# Patient Record
Sex: Male | Born: 1940 | Race: White | Hispanic: No | Marital: Married | Smoking: Former smoker
Health system: Southern US, Community
[De-identification: ages and names within clinical notes are randomized; demographics above are authoritative.]

## PROBLEM LIST (undated history)

## (undated) DIAGNOSIS — Z973 Presence of spectacles and contact lenses: Secondary | ICD-10-CM

## (undated) DIAGNOSIS — T8859XA Other complications of anesthesia, initial encounter: Secondary | ICD-10-CM

## (undated) DIAGNOSIS — E785 Hyperlipidemia, unspecified: Secondary | ICD-10-CM

## (undated) DIAGNOSIS — K449 Diaphragmatic hernia without obstruction or gangrene: Secondary | ICD-10-CM

## (undated) DIAGNOSIS — K5790 Diverticulosis of intestine, part unspecified, without perforation or abscess without bleeding: Secondary | ICD-10-CM

## (undated) DIAGNOSIS — Z974 Presence of external hearing-aid: Secondary | ICD-10-CM

## (undated) DIAGNOSIS — F319 Bipolar disorder, unspecified: Secondary | ICD-10-CM

## (undated) DIAGNOSIS — M549 Dorsalgia, unspecified: Secondary | ICD-10-CM

## (undated) DIAGNOSIS — Z972 Presence of dental prosthetic device (complete) (partial): Secondary | ICD-10-CM

## (undated) DIAGNOSIS — K8020 Calculus of gallbladder without cholecystitis without obstruction: Secondary | ICD-10-CM

## (undated) DIAGNOSIS — E119 Type 2 diabetes mellitus without complications: Secondary | ICD-10-CM

## (undated) DIAGNOSIS — N529 Male erectile dysfunction, unspecified: Secondary | ICD-10-CM

## (undated) DIAGNOSIS — K219 Gastro-esophageal reflux disease without esophagitis: Secondary | ICD-10-CM

## (undated) DIAGNOSIS — I10 Essential (primary) hypertension: Secondary | ICD-10-CM

## (undated) DIAGNOSIS — J449 Chronic obstructive pulmonary disease, unspecified: Secondary | ICD-10-CM

## (undated) DIAGNOSIS — I499 Cardiac arrhythmia, unspecified: Secondary | ICD-10-CM

## (undated) DIAGNOSIS — G8929 Other chronic pain: Secondary | ICD-10-CM

## (undated) HISTORY — DX: Dorsalgia, unspecified: M54.9

## (undated) HISTORY — DX: Other chronic pain: G89.29

## (undated) HISTORY — DX: Chronic obstructive pulmonary disease, unspecified: J44.9

## (undated) HISTORY — PX: COLONOSCOPY: SHX174

## (undated) HISTORY — DX: Bipolar disorder, unspecified: F31.9

## (undated) HISTORY — DX: Essential (primary) hypertension: I10

## (undated) HISTORY — DX: Diaphragmatic hernia without obstruction or gangrene: K44.9

## (undated) HISTORY — DX: Male erectile dysfunction, unspecified: N52.9

## (undated) HISTORY — PX: KNEE ARTHROSCOPY: SUR90

## (undated) HISTORY — PX: TONSILLECTOMY: SUR1361

## (undated) HISTORY — DX: Diverticulosis of intestine, part unspecified, without perforation or abscess without bleeding: K57.90

## (undated) HISTORY — DX: Hyperlipidemia, unspecified: E78.5

## (undated) HISTORY — PX: CATARACT EXTRACTION, BILATERAL: SHX1313

---

## 1999-11-06 ENCOUNTER — Encounter (INDEPENDENT_AMBULATORY_CARE_PROVIDER_SITE_OTHER): Payer: Self-pay | Admitting: Specialist

## 1999-11-06 ENCOUNTER — Ambulatory Visit (HOSPITAL_COMMUNITY): Admission: RE | Admit: 1999-11-06 | Discharge: 1999-11-06 | Payer: Self-pay | Admitting: Gastroenterology

## 2001-06-28 ENCOUNTER — Encounter: Payer: Self-pay | Admitting: Orthopedic Surgery

## 2001-06-28 ENCOUNTER — Ambulatory Visit (HOSPITAL_COMMUNITY): Admission: RE | Admit: 2001-06-28 | Discharge: 2001-06-28 | Payer: Self-pay | Admitting: Orthopedic Surgery

## 2003-03-15 ENCOUNTER — Encounter: Payer: Self-pay | Admitting: General Surgery

## 2003-03-22 ENCOUNTER — Ambulatory Visit (HOSPITAL_COMMUNITY): Admission: RE | Admit: 2003-03-22 | Discharge: 2003-03-22 | Payer: Self-pay | Admitting: General Surgery

## 2008-09-03 ENCOUNTER — Emergency Department (HOSPITAL_COMMUNITY): Admission: EM | Admit: 2008-09-03 | Discharge: 2008-09-03 | Payer: Self-pay | Admitting: Emergency Medicine

## 2010-07-17 ENCOUNTER — Ambulatory Visit (HOSPITAL_COMMUNITY): Admission: RE | Admit: 2010-07-17 | Discharge: 2010-07-17 | Payer: Self-pay | Admitting: Gastroenterology

## 2011-05-15 NOTE — Op Note (Signed)
NAME:  Douglas Hurst, Douglas Hurst NO.:  [id number]   MEDICAL RECORD NO.:  [id number]                   PATIENT TYPE:  AMB   LOCATION:  DAY                                  FACILITY:  [facility]   PHYSICIAN:  Adolph Pollack, M.D.            DATE OF BIRTH:  Jul 21, 1941   DATE OF PROCEDURE:  03/22/2003  DATE OF DISCHARGE:                                 OPERATIVE REPORT   PREOPERATIVE DIAGNOSIS:  Bilateral inguinal hernias.   POSTOPERATIVE DIAGNOSIS:  Bilateral direct inguinal hernias.   PROCEDURE:  Laparoscopic repair of bilateral inguinal hernias with mesh.   SURGEON:  Adolph Pollack, M.D.   ANESTHESIA:  General.   INDICATION:  Douglas Hurst is a 70 year old male with some pain in the right  groin.  He is sent to me for evaluation of right inguinal hernia, and I  noted him also to have a left inguinal hernia.  I suggested a laparoscopic  bilateral inguinal repairs with mesh.  We went over the procedure and risks  in the office.  He is here now to proceed with repair.   TECHNIQUE:  He was seen in the holding room.  He was then taken to the  operating room, placed supine on the operating table, and a general  anesthetic was administered.  The lower abdomen was shaved, and then the  lower abdomen and groin were sterilely prepped and draped.  Marcaine 0.5%  was infiltrated in the subumbilical region, and a transverse subumbilical  incision was made through the skin and subcutaneous tissue.  Using blunt  dissection, I identified the right anterior rectus sheath and made a small  incision in the anterior rectus sheath on the right side.  The underlying  right rectus muscle was then swept laterally, exposing the posterior rectus  sheath.  A balloon dissection device was then placed into the  extraperitoneal space under laparoscopic vision.  Balloon dissection was  performed.  Once this was done, the balloon dissection device was removed  and the trocars placed into  the extraperitoneal space, and CO2 gas was  insufflated, creating a working area.  Laparoscope was introduced at that  time.  The Cooper's ligament and the direct defect were evident on the right  side.  Under direct vision, I placed two 5 mm trocars in the lower midline  through similar sized incisions.  Using blunt dissection, I exposed the  anterior and lateral abdominal walls up to the level of the umbilicus.  I  then identified the spermatic cord, isolated it, and removed a lipoma of the  cord.  Peritoneum was swept superiorly.  No indirect hernia was noted.  I  had adequate exposure of the direct, indirect, and femoral spaces at this  time.   Next, I approached the left side and exposed Cooper's ligament fully.  I  then, using blunt dissection, exposed the anterior and lateral abdominal  walls  up to the level of the umbilicus.  The spermatic cord was identified  and isolated.  A small indirect defect was noted on the left side.  The  peritoneum on the left side was swept superiorly up to the level of the  umbilicus.   Next, a piece of approximately 4 x 6 inch mesh was brought into the field,  and a longitudinal slit cut into it, and it was placed in the right  extraperitoneal space.  Once adequate placement was verified, it was  anchored to the Cooper's ligament in the anterior and lateral abdominal  walls with spiral tacks.  The two tails of the mesh were wrapped around the  cord, creating a new internal ring.  The mesh adequately covered the direct,  indirect, and femoral spaces.   In a similar fashion, I had an approximately 4 x 6 inch piece of mesh and  placed it in the left extraperitoneal space and positioned with the two  tails wrapped around the cord.  The mesh was then anchored to Cooper's  ligament and the anterior abdominal wall and lateral abdominal wall with  spiral tacks.  It adequately covered the direct, indirect, and femoral  spaces.  The anterolateral aspect of  both of the meshes were then held, and  the CO2 gas was released and extraperitoneal contents were allowed to  approximate to the mesh.  Subsequently, all trocars were removed.  The right  rectus sheath fascial defect was closed with interrupted 0 Vicryl sutures.  The skin incisions were closed with 4-0 Monocryl subcuticular stitches.  Steri-Strips and sterile dressings were applied.   He tolerated the procedure well without any apparent complications and was  taken to the recovery room in satisfactory condition.  The plan will be to  discharge him to home with instructions and Tylox for pain and follow up  with me in 2-3 weeks.                                               Adolph Pollack, M.D.    Kari Baars  D:  03/22/2003  T:  03/22/2003  Job:  [id number]

## 2011-09-30 LAB — BASIC METABOLIC PANEL
BUN: 22
CO2: 29
Calcium: 9.1
Chloride: 100
Creatinine, Ser: 0.9
GFR calc Af Amer: >60
GFR calc non Af Amer: >60
Glucose, Bld: 77
Potassium: 3.9
Sodium: 137

## 2011-09-30 LAB — URINALYSIS, ROUTINE W REFLEX MICROSCOPIC
Bilirubin Urine: NEGATIVE
Ketones, ur: NEGATIVE
Nitrite: NEGATIVE
Protein, ur: NEGATIVE
Urobilinogen, UA: 1
pH: 6

## 2011-09-30 LAB — POCT CARDIAC MARKERS
CKMB, poc: 7.1
Myoglobin, poc: 74.7
Troponin i, poc: <0.05

## 2011-09-30 LAB — DIFFERENTIAL
Basophils Absolute: 0
Eosinophils Relative: 4
Lymphocytes Relative: 30
Lymphs Abs: 2.8
Neutro Abs: 5.1

## 2011-09-30 LAB — B-NATRIURETIC PEPTIDE (CONVERTED LAB): Pro B Natriuretic peptide (BNP): <30

## 2011-09-30 LAB — CBC
HCT: 53.3 — ABNORMAL HIGH
Platelets: 193
RDW: 14.2
WBC: 9.4

## 2011-09-30 LAB — D-DIMER, QUANTITATIVE: D-Dimer, Quant: 0.39

## 2012-05-16 DIAGNOSIS — J449 Chronic obstructive pulmonary disease, unspecified: Secondary | ICD-10-CM | POA: Diagnosis not present

## 2012-05-16 DIAGNOSIS — I10 Essential (primary) hypertension: Secondary | ICD-10-CM | POA: Diagnosis not present

## 2012-05-16 DIAGNOSIS — K219 Gastro-esophageal reflux disease without esophagitis: Secondary | ICD-10-CM | POA: Diagnosis not present

## 2012-05-16 DIAGNOSIS — R7301 Impaired fasting glucose: Secondary | ICD-10-CM | POA: Diagnosis not present

## 2012-05-16 DIAGNOSIS — E785 Hyperlipidemia, unspecified: Secondary | ICD-10-CM | POA: Diagnosis not present

## 2012-06-14 DIAGNOSIS — I714 Abdominal aortic aneurysm, without rupture: Secondary | ICD-10-CM | POA: Diagnosis not present

## 2012-06-14 DIAGNOSIS — Z125 Encounter for screening for malignant neoplasm of prostate: Secondary | ICD-10-CM | POA: Diagnosis not present

## 2012-06-14 DIAGNOSIS — Z1331 Encounter for screening for depression: Secondary | ICD-10-CM | POA: Diagnosis not present

## 2012-06-14 DIAGNOSIS — Z0000 Encounter for general adult medical examination without abnormal findings: Secondary | ICD-10-CM | POA: Diagnosis not present

## 2012-06-29 DIAGNOSIS — H04129 Dry eye syndrome of unspecified lacrimal gland: Secondary | ICD-10-CM | POA: Diagnosis not present

## 2012-06-29 DIAGNOSIS — Z961 Presence of intraocular lens: Secondary | ICD-10-CM | POA: Diagnosis not present

## 2012-06-29 DIAGNOSIS — H35379 Puckering of macula, unspecified eye: Secondary | ICD-10-CM | POA: Diagnosis not present

## 2012-07-01 DIAGNOSIS — I714 Abdominal aortic aneurysm, without rupture: Secondary | ICD-10-CM | POA: Diagnosis not present

## 2012-10-06 DIAGNOSIS — Z23 Encounter for immunization: Secondary | ICD-10-CM | POA: Diagnosis not present

## 2012-12-15 DIAGNOSIS — J449 Chronic obstructive pulmonary disease, unspecified: Secondary | ICD-10-CM | POA: Diagnosis not present

## 2012-12-15 DIAGNOSIS — I10 Essential (primary) hypertension: Secondary | ICD-10-CM | POA: Diagnosis not present

## 2013-06-01 DIAGNOSIS — H0230 Blepharochalasis unspecified eye, unspecified eyelid: Secondary | ICD-10-CM | POA: Diagnosis not present

## 2013-06-01 DIAGNOSIS — H35379 Puckering of macula, unspecified eye: Secondary | ICD-10-CM | POA: Diagnosis not present

## 2013-06-01 DIAGNOSIS — Z961 Presence of intraocular lens: Secondary | ICD-10-CM | POA: Diagnosis not present

## 2013-06-01 DIAGNOSIS — H02409 Unspecified ptosis of unspecified eyelid: Secondary | ICD-10-CM | POA: Diagnosis not present

## 2013-06-15 DIAGNOSIS — R7301 Impaired fasting glucose: Secondary | ICD-10-CM | POA: Diagnosis not present

## 2013-06-15 DIAGNOSIS — J449 Chronic obstructive pulmonary disease, unspecified: Secondary | ICD-10-CM | POA: Diagnosis not present

## 2013-06-15 DIAGNOSIS — R7989 Other specified abnormal findings of blood chemistry: Secondary | ICD-10-CM | POA: Diagnosis not present

## 2013-06-15 DIAGNOSIS — I10 Essential (primary) hypertension: Secondary | ICD-10-CM | POA: Diagnosis not present

## 2013-06-15 DIAGNOSIS — E785 Hyperlipidemia, unspecified: Secondary | ICD-10-CM | POA: Diagnosis not present

## 2013-06-15 DIAGNOSIS — I714 Abdominal aortic aneurysm, without rupture: Secondary | ICD-10-CM | POA: Diagnosis not present

## 2013-07-21 DIAGNOSIS — I714 Abdominal aortic aneurysm, without rupture: Secondary | ICD-10-CM | POA: Diagnosis not present

## 2013-12-15 DIAGNOSIS — I10 Essential (primary) hypertension: Secondary | ICD-10-CM | POA: Diagnosis not present

## 2013-12-15 DIAGNOSIS — Z23 Encounter for immunization: Secondary | ICD-10-CM | POA: Diagnosis not present

## 2013-12-15 DIAGNOSIS — Z1331 Encounter for screening for depression: Secondary | ICD-10-CM | POA: Diagnosis not present

## 2013-12-15 DIAGNOSIS — J449 Chronic obstructive pulmonary disease, unspecified: Secondary | ICD-10-CM | POA: Diagnosis not present

## 2013-12-15 DIAGNOSIS — R7301 Impaired fasting glucose: Secondary | ICD-10-CM | POA: Diagnosis not present

## 2013-12-15 DIAGNOSIS — E785 Hyperlipidemia, unspecified: Secondary | ICD-10-CM | POA: Diagnosis not present

## 2013-12-15 DIAGNOSIS — N529 Male erectile dysfunction, unspecified: Secondary | ICD-10-CM | POA: Diagnosis not present

## 2014-06-18 ENCOUNTER — Other Ambulatory Visit: Payer: Self-pay | Admitting: Internal Medicine

## 2014-06-18 DIAGNOSIS — I10 Essential (primary) hypertension: Secondary | ICD-10-CM | POA: Diagnosis not present

## 2014-06-18 DIAGNOSIS — R7301 Impaired fasting glucose: Secondary | ICD-10-CM | POA: Diagnosis not present

## 2014-06-18 DIAGNOSIS — N529 Male erectile dysfunction, unspecified: Secondary | ICD-10-CM | POA: Diagnosis not present

## 2014-06-18 DIAGNOSIS — I714 Abdominal aortic aneurysm, without rupture, unspecified: Secondary | ICD-10-CM | POA: Diagnosis not present

## 2014-06-18 DIAGNOSIS — Z23 Encounter for immunization: Secondary | ICD-10-CM | POA: Diagnosis not present

## 2014-06-18 DIAGNOSIS — E785 Hyperlipidemia, unspecified: Secondary | ICD-10-CM | POA: Diagnosis not present

## 2014-06-18 DIAGNOSIS — Z0000 Encounter for general adult medical examination without abnormal findings: Secondary | ICD-10-CM | POA: Diagnosis not present

## 2014-06-18 DIAGNOSIS — Z1331 Encounter for screening for depression: Secondary | ICD-10-CM | POA: Diagnosis not present

## 2014-06-18 DIAGNOSIS — J449 Chronic obstructive pulmonary disease, unspecified: Secondary | ICD-10-CM | POA: Diagnosis not present

## 2014-06-18 DIAGNOSIS — K219 Gastro-esophageal reflux disease without esophagitis: Secondary | ICD-10-CM | POA: Diagnosis not present

## 2014-06-21 DIAGNOSIS — J449 Chronic obstructive pulmonary disease, unspecified: Secondary | ICD-10-CM | POA: Diagnosis not present

## 2014-06-26 ENCOUNTER — Ambulatory Visit
Admission: RE | Admit: 2014-06-26 | Discharge: 2014-06-26 | Disposition: A | Discharge status: Home / Self Care | Payer: Medicare Other | Source: Ambulatory Visit | Attending: Internal Medicine | Admitting: Internal Medicine

## 2014-06-26 DIAGNOSIS — I714 Abdominal aortic aneurysm, without rupture, unspecified: Secondary | ICD-10-CM

## 2014-11-26 DIAGNOSIS — H35373 Puckering of macula, bilateral: Secondary | ICD-10-CM | POA: Diagnosis not present

## 2014-11-26 DIAGNOSIS — Z961 Presence of intraocular lens: Secondary | ICD-10-CM | POA: Diagnosis not present

## 2014-11-28 DIAGNOSIS — J329 Chronic sinusitis, unspecified: Secondary | ICD-10-CM | POA: Diagnosis not present

## 2014-12-02 ENCOUNTER — Encounter: Payer: Self-pay | Admitting: *Deleted

## 2014-12-12 DIAGNOSIS — Z23 Encounter for immunization: Secondary | ICD-10-CM | POA: Diagnosis not present

## 2014-12-24 DIAGNOSIS — N5201 Erectile dysfunction due to arterial insufficiency: Secondary | ICD-10-CM | POA: Diagnosis not present

## 2014-12-24 DIAGNOSIS — E119 Type 2 diabetes mellitus without complications: Secondary | ICD-10-CM | POA: Diagnosis not present

## 2014-12-24 DIAGNOSIS — I10 Essential (primary) hypertension: Secondary | ICD-10-CM | POA: Diagnosis not present

## 2014-12-24 DIAGNOSIS — J449 Chronic obstructive pulmonary disease, unspecified: Secondary | ICD-10-CM | POA: Diagnosis not present

## 2014-12-24 DIAGNOSIS — K219 Gastro-esophageal reflux disease without esophagitis: Secondary | ICD-10-CM | POA: Diagnosis not present

## 2015-06-25 DIAGNOSIS — Z1389 Encounter for screening for other disorder: Secondary | ICD-10-CM | POA: Diagnosis not present

## 2015-06-25 DIAGNOSIS — R1314 Dysphagia, pharyngoesophageal phase: Secondary | ICD-10-CM | POA: Diagnosis not present

## 2015-06-25 DIAGNOSIS — I10 Essential (primary) hypertension: Secondary | ICD-10-CM | POA: Diagnosis not present

## 2015-06-25 DIAGNOSIS — E782 Mixed hyperlipidemia: Secondary | ICD-10-CM | POA: Diagnosis not present

## 2015-06-25 DIAGNOSIS — E119 Type 2 diabetes mellitus without complications: Secondary | ICD-10-CM | POA: Diagnosis not present

## 2015-06-25 DIAGNOSIS — I714 Abdominal aortic aneurysm, without rupture: Secondary | ICD-10-CM | POA: Diagnosis not present

## 2015-06-25 DIAGNOSIS — F319 Bipolar disorder, unspecified: Secondary | ICD-10-CM | POA: Diagnosis not present

## 2015-06-25 DIAGNOSIS — J449 Chronic obstructive pulmonary disease, unspecified: Secondary | ICD-10-CM | POA: Diagnosis not present

## 2015-09-18 ENCOUNTER — Other Ambulatory Visit: Payer: Self-pay | Admitting: Gastroenterology

## 2015-09-18 DIAGNOSIS — R1314 Dysphagia, pharyngoesophageal phase: Secondary | ICD-10-CM | POA: Diagnosis not present

## 2015-09-18 DIAGNOSIS — R1310 Dysphagia, unspecified: Secondary | ICD-10-CM

## 2015-09-18 DIAGNOSIS — Z800 Family history of malignant neoplasm of digestive organs: Secondary | ICD-10-CM | POA: Diagnosis not present

## 2015-09-24 ENCOUNTER — Ambulatory Visit
Admission: RE | Admit: 2015-09-24 | Discharge: 2015-09-24 | Disposition: A | Discharge status: Home / Self Care | Payer: Medicare Other | Source: Ambulatory Visit | Attending: Gastroenterology | Admitting: Gastroenterology

## 2015-09-24 ENCOUNTER — Other Ambulatory Visit: Payer: Self-pay | Admitting: Gastroenterology

## 2015-09-24 DIAGNOSIS — R1310 Dysphagia, unspecified: Secondary | ICD-10-CM | POA: Diagnosis not present

## 2015-09-26 ENCOUNTER — Other Ambulatory Visit: Payer: Self-pay | Admitting: Gastroenterology

## 2015-09-26 ENCOUNTER — Encounter (HOSPITAL_COMMUNITY): Payer: Self-pay | Admitting: *Deleted

## 2015-10-01 ENCOUNTER — Ambulatory Visit (HOSPITAL_COMMUNITY)
Admission: RE | Admit: 2015-10-01 | Discharge: 2015-10-01 | Disposition: A | Discharge status: Home / Self Care | Payer: Medicare Other | Source: Ambulatory Visit | Attending: Gastroenterology | Admitting: Gastroenterology

## 2015-10-01 ENCOUNTER — Ambulatory Visit (HOSPITAL_COMMUNITY): Payer: Medicare Other | Admitting: Certified Registered Nurse Anesthetist

## 2015-10-01 ENCOUNTER — Encounter (HOSPITAL_COMMUNITY)
Admission: RE | Disposition: A | Discharge status: Home / Self Care | Payer: Self-pay | Source: Ambulatory Visit | Attending: Gastroenterology

## 2015-10-01 ENCOUNTER — Encounter (HOSPITAL_COMMUNITY): Payer: Self-pay | Admitting: *Deleted

## 2015-10-01 DIAGNOSIS — Z9842 Cataract extraction status, left eye: Secondary | ICD-10-CM | POA: Insufficient documentation

## 2015-10-01 DIAGNOSIS — E7800 Pure hypercholesterolemia, unspecified: Secondary | ICD-10-CM | POA: Insufficient documentation

## 2015-10-01 DIAGNOSIS — Z800 Family history of malignant neoplasm of digestive organs: Secondary | ICD-10-CM | POA: Insufficient documentation

## 2015-10-01 DIAGNOSIS — E119 Type 2 diabetes mellitus without complications: Secondary | ICD-10-CM | POA: Diagnosis not present

## 2015-10-01 DIAGNOSIS — K449 Diaphragmatic hernia without obstruction or gangrene: Secondary | ICD-10-CM | POA: Diagnosis not present

## 2015-10-01 DIAGNOSIS — Z87891 Personal history of nicotine dependence: Secondary | ICD-10-CM | POA: Diagnosis not present

## 2015-10-01 DIAGNOSIS — K219 Gastro-esophageal reflux disease without esophagitis: Secondary | ICD-10-CM | POA: Insufficient documentation

## 2015-10-01 DIAGNOSIS — Z9841 Cataract extraction status, right eye: Secondary | ICD-10-CM | POA: Diagnosis not present

## 2015-10-01 DIAGNOSIS — K222 Esophageal obstruction: Secondary | ICD-10-CM | POA: Insufficient documentation

## 2015-10-01 DIAGNOSIS — K5710 Diverticulosis of small intestine without perforation or abscess without bleeding: Secondary | ICD-10-CM | POA: Insufficient documentation

## 2015-10-01 DIAGNOSIS — I10 Essential (primary) hypertension: Secondary | ICD-10-CM | POA: Insufficient documentation

## 2015-10-01 DIAGNOSIS — J449 Chronic obstructive pulmonary disease, unspecified: Secondary | ICD-10-CM | POA: Insufficient documentation

## 2015-10-01 DIAGNOSIS — R1314 Dysphagia, pharyngoesophageal phase: Secondary | ICD-10-CM | POA: Diagnosis not present

## 2015-10-01 DIAGNOSIS — Z86010 Personal history of colonic polyps: Secondary | ICD-10-CM | POA: Diagnosis not present

## 2015-10-01 DIAGNOSIS — R1310 Dysphagia, unspecified: Secondary | ICD-10-CM | POA: Diagnosis present

## 2015-10-01 HISTORY — PX: ESOPHAGOGASTRODUODENOSCOPY (EGD) WITH PROPOFOL: SHX5813

## 2015-10-01 HISTORY — PX: BALLOON DILATION: SHX5330

## 2015-10-01 SURGERY — ESOPHAGOGASTRODUODENOSCOPY (EGD) WITH PROPOFOL
Anesthesia: Monitor Anesthesia Care

## 2015-10-01 MED ORDER — PROPOFOL 10 MG/ML IV BOLUS
INTRAVENOUS | Status: AC
  Filled 2015-10-01: qty 20

## 2015-10-01 MED ORDER — EPHEDRINE SULFATE 50 MG/ML IJ SOLN
INTRAMUSCULAR | Status: AC
  Filled 2015-10-01: qty 1

## 2015-10-01 MED ORDER — SODIUM CHLORIDE 0.9 % IV SOLN: INTRAVENOUS | Status: DC

## 2015-10-01 MED ORDER — ONDANSETRON HCL 4 MG/2ML IJ SOLN
INTRAMUSCULAR | Status: AC
Start: 2015-10-01 — End: 2015-10-01
  Filled 2015-10-01: qty 2

## 2015-10-01 MED ORDER — LIDOCAINE HCL (CARDIAC) 20 MG/ML IV SOLN
INTRAVENOUS | Status: AC
  Filled 2015-10-01: qty 5

## 2015-10-01 MED ORDER — PROPOFOL 500 MG/50ML IV EMUL
INTRAVENOUS | Status: DC | PRN
  Administered 2015-10-01: 100 ug/kg/min via INTRAVENOUS

## 2015-10-01 MED ORDER — ATROPINE SULFATE 0.4 MG/ML IJ SOLN
INTRAMUSCULAR | Status: AC
  Filled 2015-10-01: qty 1

## 2015-10-01 MED ORDER — LACTATED RINGERS IV SOLN
INTRAVENOUS | Status: DC
  Administered 2015-10-01: 1000 mL via INTRAVENOUS

## 2015-10-01 MED ORDER — PROPOFOL 10 MG/ML IV BOLUS
INTRAVENOUS | Status: DC | PRN
  Administered 2015-10-01 (×3): 20 mg via INTRAVENOUS

## 2015-10-01 MED ORDER — ONDANSETRON HCL 4 MG/2ML IJ SOLN
INTRAMUSCULAR | Status: DC | PRN
  Administered 2015-10-01: 4 mg via INTRAVENOUS

## 2015-10-01 MED ORDER — SODIUM CHLORIDE 0.9 % IJ SOLN
INTRAMUSCULAR | Status: AC
  Filled 2015-10-01: qty 10

## 2015-10-01 MED ORDER — LIDOCAINE HCL (CARDIAC) 20 MG/ML IV SOLN
INTRAVENOUS | Status: DC | PRN
  Administered 2015-10-01: 100 mg via INTRAVENOUS

## 2015-10-01 SURGICAL SUPPLY — 14 items

## 2015-10-01 NOTE — Discharge Instructions (Signed)
Esophagogastroduodenoscopy °Care After °Refer to this sheet in the next few weeks. These instructions provide you with information on caring for yourself after your procedure. Your caregiver may also give you more specific instructions. Your treatment has been planned according to current medical practices, but problems sometimes occur. Call your caregiver if you have any problems or questions after your procedure.  °HOME CARE INSTRUCTIONS °· Do not eat or drink anything until the numbing medicine (local anesthetic) has worn off and your gag reflex has returned. You will know that the local anesthetic has worn off when you can swallow comfortably. °· Do not drive for 12 hours after the procedure or as directed by your caregiver. °· Only take medicines as directed by your caregiver. °SEEK MEDICAL CARE IF:  °· You cannot stop coughing. °· You are not urinating at all or less than usual. °SEEK IMMEDIATE MEDICAL CARE IF: °· You have difficulty swallowing. °· You cannot eat or drink. °· You have worsening throat or chest pain. °· You have dizziness, lightheadedness, or you faint. °· You have nausea or vomiting. °· You have chills. °· You have a fever. °· You have severe abdominal pain. °· You have black, tarry, or bloody stools. °Document Released: 11/30/2012 Document Reviewed: 11/30/2012 °ExitCare® Patient Information ©2015 [employer]. This information is not intended to replace advice given to you by your health care provider. Make sure you discuss any questions you have with your health care provider. °Esophageal Dilatation °The esophagus is the long, narrow tube which carries food and liquid from the mouth to the stomach. Esophageal dilatation is the technique used to stretch a blocked or narrowed portion of the esophagus. This procedure is used when a part of the esophagus has become so narrow that it becomes difficult, painful or even impossible to swallow. This is generally an uncomplicated form of treatment.  When this is not successful, chest surgery may be required. This is a much more extensive form of treatment with a longer recovery time. °CAUSES  °Some of the more common causes of blockage or strictures of the esophagus are: °· Narrowing from longstanding inflammation (soreness and redness) of the lower esophagus. This comes from the constant exposure of the lower esophagus to the acid which bubbles up from the stomach. Over time this causes scarring and narrowing of the lower esophagus. °· Hiatal hernia in which a small part of the stomach bulges (herniates) up through the diaphragm. This can cause a gradual narrowing of the end of the esophagus. °· Schatzki ring is a narrow ring of benign (non-cancerous) fibrous tissue which constricts the lower esophagus. The reason for this is not known. °· Scleroderma is a connective tissue disorder that affects the esophagus and makes swallowing difficult. °· Achalasia is an absence of nerves to the lower esophagus and to the esophageal sphincter. This is the circular muscle between the stomach and esophagus that relaxes to allow food into the stomach. After swallowing, it contracts to keep food in the stomach. This absence of nerves may be congenital (present since birth). This can cause irregular spasms of the lower esophageal muscle. This spasm does not open up to allow food and fluid through. The result is a persistent blockage with subsequent slow trickling of the esophageal contents into the stomach. °· Strictures may develop from swallowing materials which damage the esophagus. Some examples are strong acids or alkalis such as lye. °· Growths such as benign (non-cancerous) and malignant (cancerous) tumors can block the esophagus. °· Hereditary (present   since birth) causes. °DIAGNOSIS  °Your caregiver often suspects this problem by taking a medical history. They will also do a physical exam. They can then prove their suspicions using X-rays and endoscopy. Endoscopy is  an exam in which a tube like a small, flexible telescope is used to look at your esophagus.  °TREATMENT °There are different stretching (dilating) techniques that can be used. Simple bougie dilatation may be done in the office. This usually takes only a couple minutes. A numbing (anesthetic) spray of the throat is used. Endoscopy, when done, is done in an endoscopy suite under mild sedation. When fluoroscopy is used, the procedure is performed in X-ray. Other techniques require a little longer time. Recovery is usually quick. There is no waiting time to begin eating and drinking to test success of the treatment. Following are some of the methods used. °Narrowing of the esophagus is treated by making it bigger. °Commonly this is a mechanical problem which can be treated with stretching. This can be done in different ways. Your caregiver will discuss these with you. Some of the means used are: °· A series of graduated (increasing thickness) flexible dilators can be used. These are weighted tubes passed through the esophagus into the stomach. The tubes used become progressively larger until the desired stretched size is reached. Graduated dilators are a simple and quick way of opening the esophagus. No visualization is required. °· Another method is the use of endoscopy to place a flexible wire across the stricture. The endoscope is removed and the wire left in place. A dilator with a hole through it from end to end is guided down the esophagus and across the stricture. One or more of these dilators are passed over the wire. At the end of the exam, the wire is removed. This type of treatment may be performed in the X-ray department under fluoroscopy. An advantage of this procedure is the examiner is visualizing the end opening in the esophagus. °· Stretching of the esophagus may be done using balloons. Deflated balloons are placed through the endoscope and across the stricture. This type of balloon dilatation is often  done at the time of endoscopy or fluoroscopy. Flexible endoscopy allows the examiner to directly view the stricture. A balloon is inserted in the deflated form into the area of narrowing. It is then inflated with air to a certain pressure that is preset for a given circumference. When inflated, it becomes sausage shaped, stretched, and makes the stricture larger. °· Achalasia requires a longer, larger balloon-type dilator. This is frequently done under X-ray control. In this situation, the spastic muscle fibers in the lower esophagus are stretched. °All of the above procedures make the passage of food and water into the stomach easier. They also make it easier for stomach contents to reflux back into the esophagus. Special medications may be used following the procedure to help prevent further stricturing. Proton-pump inhibitor medications are good at decreasing the amount of acid in the stomach juice. When stomach juice refluxes into the esophagus, the juice is no longer as acidic and is less likely to burn or scar the esophagus. °RISKS AND COMPLICATIONS °Esophageal dilatation is usually performed effectively and without problems. Some complications that can occur are: °· A small amount of bleeding almost always happens where the stretching takes place. If this is too excessive it may require more aggressive treatment. °· An uncommon complication is perforation (making a hole) of the esophagus. The esophagus is thin. It is easy to make   a hole in it. If this happens, an operation may be necessary to repair this. °· A small, undetected perforation could lead to an infection in the chest. This can be very serious. °HOME CARE INSTRUCTIONS  °· If you received sedation for your procedure, do not drive, make important decisions, or perform any activities requiring your full coordination. Do not drink alcohol, take sedatives, or use any mind altering chemicals unless instructed by your caregiver. °· You may use throat  lozenges or warm salt water gargles if you have throat discomfort. °· You can begin eating and drinking normally on return home unless instructed otherwise. Do not purposely try to force large chunks of food down to test the benefits of your procedure. °· Mild discomfort can be eased with sips of ice water. °· Medications for discomfort may or may not be needed. °SEEK IMMEDIATE MEDICAL CARE IF:  °· You begin vomiting up blood. °· You develop black, tarry stools. °· You develop chills or an unexplained temperature of over 101°F (38.3°C) °· You develop chest or abdominal pain. °· You develop shortness of breath, or feel light-headed or faint. °· Your swallowing is becoming more painful, difficult, or you are unable to swallow. °MAKE SURE YOU:  °· Understand these instructions. °· Will watch your condition. °· Will get help right away if you are not doing well or get worse. °Document Released: 02/04/2006 Document Revised: 04/30/2014 Document Reviewed: 03/24/2006 °ExitCare® Patient Information ©2015 [employer]. This information is not intended to replace advice given to you by your health care provider. Make sure you discuss any questions you have with your health care provider. ° °

## 2015-10-01 NOTE — Anesthesia Postprocedure Evaluation (Signed)
  Anesthesia Post-op Note  Patient: Douglas Hurst.  Procedure(s) Performed: Procedure(s) (LRB): ESOPHAGOGASTRODUODENOSCOPY (EGD) WITH PROPOFOL (N/A) BALLOON DILATION (N/A)  Patient Location: PACU  Anesthesia Type: MAC  Level of Consciousness: awake and alert   Airway and Oxygen Therapy: Patient Spontanous Breathing  Post-op Pain: mild  Post-op Assessment: Post-op Vital signs reviewed, Patient's Cardiovascular Status Stable, Respiratory Function Stable, Patent Airway and No signs of Nausea or vomiting  Last Vitals:  Filed Vitals:   10/01/15 0810  BP: 174/82  Pulse:   Temp:   Resp:     Post-op Vital Signs: stable   Complications: No apparent anesthesia complications

## 2015-10-01 NOTE — Transfer of Care (Signed)
Immediate Anesthesia Transfer of Care Note  Patient: Douglas Hurst.  Procedure(s) Performed: Procedure(s): ESOPHAGOGASTRODUODENOSCOPY (EGD) WITH PROPOFOL (N/A) BALLOON DILATION (N/A)  Patient Location: ENDO  Anesthesia Type:MAC  Level of Consciousness:  sedated, patient cooperative and responds to stimulation  Airway & Oxygen Therapy:Patient Spontanous Breathing and Patient connected to face mask oxgen  Post-op Assessment:  Report given to ENDO RN and Post -op Vital signs reviewed and stable  Post vital signs:  Reviewed and stable  Last Vitals:  Filed Vitals:   10/01/15 0645  BP: 163/82  Pulse: 65  Temp: 36.6 C  Resp: 20    Complications: No apparent anesthesia complications

## 2015-10-01 NOTE — Op Note (Signed)
Problem: Esophageal dysphagia. Diffuse esophageal spasm by esophageal manometry performed years ago. 09/24/2015 barium esophagram showed narrowing of the distal esophageal lumen which did not allow passage of the 13 mm barium.  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 The Pentax gastroscope was passed through the posterior hypopharynx into the proximal esophagus without difficulty. The hypopharynx, larynx, and vocal cords appeared normal.  Esophagoscopy: The proximal and mid segments of the esophageal mucosa appeared normal. The squamocolumnar junction was noted at 45 cm from the incisor teeth. 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 I was easily able to traverse the stricture with the Pentax gastroscope and into the proximal stomach. 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 There was no endoscopic evidence for the presence of erosive esophagitis or Barrett's esophagus. The patient does take pantoprazole each morning to prevent heartburn.  Gastroscopy: There was a moderate sized hiatal hernia. Retroflexed view of the gastric cardia and fundus was normal. The gastric body, antrum, and pylorus appeared normal.  Duodenoscopy: The duodenal bulb appeared normal. 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 The descending duodenum appeared normal.  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  Recommendation: Continue taking pantoprazole before breakfast each morning to prevent acid reflux into the esophagus.

## 2015-10-01 NOTE — Anesthesia Preprocedure Evaluation (Addendum)
Anesthesia Evaluation  Patient identified by MRN, date of birth, ID band Patient awake    Reviewed: Allergy & Precautions, NPO status , Patient's Chart, lab work & pertinent test results  Airway Mallampati: III  TM Distance: >3 FB Neck ROM: Full    Dental no notable dental hx. (+) Upper Dentures   Pulmonary COPD, former smoker,    Pulmonary exam normal breath sounds clear to auscultation       Cardiovascular hypertension, Pt. on medications Normal cardiovascular exam Rhythm:Regular Rate:Normal     Neuro/Psych negative neurological ROS  negative psych ROS   GI/Hepatic Neg liver ROS, hiatal hernia,   Endo/Other  negative endocrine ROS  Renal/GU negative Renal ROS  negative genitourinary   Musculoskeletal negative musculoskeletal ROS (+)   Abdominal   Peds negative pediatric ROS (+)  Hematology negative hematology ROS (+)   Anesthesia Other Findings   Reproductive/Obstetrics negative OB ROS                            Anesthesia Physical Anesthesia Plan  ASA: II  Anesthesia Plan: MAC   Post-op Pain Management:    Induction:   Airway Management Planned: Natural Airway  Additional Equipment:   Intra-op Plan:   Post-operative Plan:   Informed Consent: I have reviewed the patients History and Physical, chart, labs and discussed the procedure including the risks, benefits and alternatives for the proposed anesthesia with the patient or authorized representative who has indicated his/her understanding and acceptance.   Dental advisory given  Plan Discussed with: CRNA  Anesthesia Plan Comments:         Anesthesia Quick Evaluation

## 2015-10-01 NOTE — H&P (Signed)
  Problem: Esophageal dysphagia. 09/24/2015 barium esophagram showed either a distal esophageal stricture or distal esophageal spasm which did not allow passage of the barium tablet into the stomach.  History: The patient is a 74 year old male born 09-Jul-1941. He takes pantoprazole on a daily basis to prevent heartburn. He has chronic, intermittent solid food dysphagia unassociated with chest pain or odynophagia. He underwent esophageal manometry years ago and was diagnosed with esophageal spasm.  On 09/24/2015, he underwent a barium esophagram with barium tablet which showed distal esophageal narrowing which did not allow passage of the barium tablet. Differential diagnosis included esophageal stricture formation or achalasia  The patient is scheduled to undergo diagnostic esophagogastroduodenoscopy with possible esophageal stricture dilation today.  Past medical history: Chronic obstructive pulmonary disease. Type 2 diabetes mellitus. Hypertension. Benign prosthetic hypertrophy. Hypercholesterolemia. Diffuse esophageal spasm. Colonic diverticulosis. History of alcohol excess. Alcohol abstinent for 18 years. Bipolar disorder. Tonsillectomy. Left knee arthroscopy. Right inguinal herniorrhaphy. Bilateral cataract surgery. History of adenomatous colon polyp removed colonoscopically. Normal screening colonoscopy performed on 07/17/2010  Medication allergies: None  Family history: Father diagnosed with colon cancer  Exam: The patient is alert and lying comfortably on the endoscopy stretcher. Abdomen is soft and nontender to palpation. Cardiac exam reveals a regular rhythm. Lungs are clear to auscultation.  Plan: Proceed with diagnostic esophagogastroduodenoscopy with possible esophageal stricture dilation. If the diagnostic esophagogastroduodenoscopy is normal, schedule the patient for high resolution esophageal manometry to rule out esophageal dysmotility.

## 2015-10-02 ENCOUNTER — Emergency Department (HOSPITAL_COMMUNITY)
Admission: EM | Admit: 2015-10-02 | Discharge: 2015-10-03 | Disposition: A | Discharge status: Home / Self Care | Payer: Medicare Other | Attending: Emergency Medicine | Admitting: Emergency Medicine

## 2015-10-02 ENCOUNTER — Emergency Department (HOSPITAL_COMMUNITY): Payer: Medicare Other

## 2015-10-02 ENCOUNTER — Encounter (HOSPITAL_COMMUNITY): Payer: Self-pay | Admitting: Gastroenterology

## 2015-10-02 DIAGNOSIS — Z79899 Other long term (current) drug therapy: Secondary | ICD-10-CM | POA: Insufficient documentation

## 2015-10-02 DIAGNOSIS — J449 Chronic obstructive pulmonary disease, unspecified: Secondary | ICD-10-CM | POA: Diagnosis not present

## 2015-10-02 DIAGNOSIS — R4182 Altered mental status, unspecified: Secondary | ICD-10-CM | POA: Insufficient documentation

## 2015-10-02 DIAGNOSIS — Z87891 Personal history of nicotine dependence: Secondary | ICD-10-CM | POA: Diagnosis not present

## 2015-10-02 DIAGNOSIS — R0989 Other specified symptoms and signs involving the circulatory and respiratory systems: Secondary | ICD-10-CM | POA: Diagnosis not present

## 2015-10-02 DIAGNOSIS — E785 Hyperlipidemia, unspecified: Secondary | ICD-10-CM | POA: Diagnosis not present

## 2015-10-02 DIAGNOSIS — I10 Essential (primary) hypertension: Secondary | ICD-10-CM | POA: Insufficient documentation

## 2015-10-02 DIAGNOSIS — G8929 Other chronic pain: Secondary | ICD-10-CM | POA: Diagnosis not present

## 2015-10-02 DIAGNOSIS — F319 Bipolar disorder, unspecified: Secondary | ICD-10-CM | POA: Diagnosis not present

## 2015-10-02 DIAGNOSIS — R410 Disorientation, unspecified: Secondary | ICD-10-CM | POA: Diagnosis not present

## 2015-10-02 DIAGNOSIS — F05 Delirium due to known physiological condition: Secondary | ICD-10-CM | POA: Diagnosis not present

## 2015-10-02 LAB — CBC
HCT: 47.5 % (ref 39.0–52.0)
Hemoglobin: 16.1 g/dL (ref 13.0–17.0)
MCH: 31.1 pg (ref 26.0–34.0)
MCHC: 33.9 g/dL (ref 30.0–36.0)
MCV: 91.7 fL (ref 78.0–100.0)
Platelets: 220 10*3/uL (ref 150–400)
RBC: 5.18 MIL/uL (ref 4.22–5.81)
RDW: 13.5 % (ref 11.5–15.5)
WBC: 8.6 10*3/uL (ref 4.0–10.5)

## 2015-10-02 LAB — DIFFERENTIAL
Basophils Absolute: 0 10*3/uL (ref 0.0–0.1)
Basophils Relative: 0 %
Eosinophils Absolute: 0.2 10*3/uL (ref 0.0–0.7)
Eosinophils Relative: 2 %
Lymphocytes Relative: 24 %
Lymphs Abs: 2.1 10*3/uL (ref 0.7–4.0)
Monocytes Absolute: 0.6 10*3/uL (ref 0.1–1.0)
Monocytes Relative: 7 %
Neutro Abs: 5.7 10*3/uL (ref 1.7–7.7)
Neutrophils Relative %: 67 %

## 2015-10-02 LAB — COMPREHENSIVE METABOLIC PANEL
ALT: 19 U/L (ref 17–63)
AST: 25 U/L (ref 15–41)
Albumin: 4.1 g/dL (ref 3.5–5.0)
Alkaline Phosphatase: 69 U/L (ref 38–126)
Anion gap: 11 (ref 5–15)
BUN: 18 mg/dL (ref 6–20)
CO2: 26 mmol/L (ref 22–32)
Calcium: 9.6 mg/dL (ref 8.9–10.3)
Chloride: 99 mmol/L — ABNORMAL LOW (ref 101–111)
Creatinine, Ser: 1.33 mg/dL — ABNORMAL HIGH (ref 0.61–1.24)
GFR calc Af Amer: >60 mL/min (ref >60)
GFR calc non Af Amer: 52 mL/min — ABNORMAL LOW (ref >60)
Glucose, Bld: 171 mg/dL — ABNORMAL HIGH (ref 65–99)
Potassium: 3.7 mmol/L (ref 3.5–5.1)
Sodium: 136 mmol/L (ref 135–145)
Total Bilirubin: 1.1 mg/dL (ref 0.3–1.2)
Total Protein: 7.1 g/dL (ref 6.5–8.1)

## 2015-10-02 LAB — I-STAT CHEM 8, ED
BUN: 23 mg/dL — ABNORMAL HIGH (ref 6–20)
Calcium, Ion: 1.15 mmol/L (ref 1.13–1.30)
Chloride: 100 mmol/L — ABNORMAL LOW (ref 101–111)
Creatinine, Ser: 1.3 mg/dL — ABNORMAL HIGH (ref 0.61–1.24)
Glucose, Bld: 172 mg/dL — ABNORMAL HIGH (ref 65–99)
HCT: 53 % — ABNORMAL HIGH (ref 39.0–52.0)
Hemoglobin: 18 g/dL — ABNORMAL HIGH (ref 13.0–17.0)
Potassium: 3.6 mmol/L (ref 3.5–5.1)
Sodium: 141 mmol/L (ref 135–145)
TCO2: 24 mmol/L (ref 0–100)

## 2015-10-02 LAB — CBG MONITORING, ED
Glucose-Capillary: 179 mg/dL — ABNORMAL HIGH (ref 65–99)
Glucose-Capillary: 116 mg/dL — ABNORMAL HIGH (ref 65–99)

## 2015-10-02 LAB — URINALYSIS, ROUTINE W REFLEX MICROSCOPIC
Bilirubin Urine: NEGATIVE
Glucose, UA: NEGATIVE mg/dL
Hgb urine dipstick: NEGATIVE
Ketones, ur: 15 mg/dL — AB
Leukocytes, UA: NEGATIVE
Nitrite: NEGATIVE
Protein, ur: NEGATIVE mg/dL
Specific Gravity, Urine: 1.02 (ref 1.005–1.030)
Urobilinogen, UA: 4 mg/dL — ABNORMAL HIGH (ref 0.0–1.0)
pH: 6.5 (ref 5.0–8.0)

## 2015-10-02 LAB — PROTIME-INR
INR: 1.14 (ref 0.00–1.49)
Prothrombin Time: 14.8 seconds (ref 11.6–15.2)

## 2015-10-02 LAB — I-STAT TROPONIN, ED: Troponin i, poc: 0.02 ng/mL (ref 0.00–0.08)

## 2015-10-02 LAB — APTT: aPTT: 29 seconds (ref 24–37)

## 2015-10-02 MED ORDER — SODIUM CHLORIDE 0.9 % IV BOLUS (SEPSIS)
1000.0000 mL | Freq: Once | INTRAVENOUS | Status: AC
  Administered 2015-10-02: 1000 mL via INTRAVENOUS

## 2015-10-02 NOTE — Discharge Instructions (Signed)
Confusion Confusion is the inability to think with your usual speed or clarity. Confusion may come on quickly or slowly over time. How quickly the confusion comes on depends on the cause. Confusion can be due to any number of causes. CAUSES   Concussion, head injury, or head trauma.  Seizures.  Stroke.  Fever.  Brain tumor.  Age related decreased brain function (dementia).  Heightened emotional states like rage or terror.  Mental illness in which the person loses the ability to determine what is real and what is not (hallucinations).  Infections such as a urinary tract infection (UTI).  Toxic effects from alcohol, drugs, or prescription medicines.  Dehydration and an imbalance of salts in the body (electrolytes).  Lack of sleep.  Low blood sugar (diabetes).  Low levels of oxygen from conditions such as chronic lung disorders.  Drug interactions or other medicine side effects.  Nutritional deficiencies, especially niacin, thiamine, vitamin C, or vitamin B.  Sudden drop in body temperature (hypothermia).  Change in routine, such as when traveling or hospitalized. SIGNS AND SYMPTOMS  People often describe their thinking as cloudy or unclear when they are confused. Confusion can also include feeling disoriented. That means you are unaware of where or who you are. You may also not know what the date or time is. If confused, you may also have difficulty paying attention, remembering, and making decisions. Some people also act aggressively when they are confused.  DIAGNOSIS  The medical evaluation of confusion may include:  Blood and urine tests.  X-rays.  Brain and nervous system tests.  Analyzing your brain waves (electroencephalogram or EEG).  Magnetic resonance imaging (MRI) of your head.  Computed tomography (CT) scan of your head.  Mental status tests in which your health care provider may ask many questions. Some of these questions may seem silly or strange,  but they are a very important test to help diagnose and treat confusion. TREATMENT  An admission to the hospital may not be needed, but a person with confusion should not be left alone. Stay with a family member or friend until the confusion clears. Avoid alcohol, pain relievers, or sedative drugs until you have fully recovered. Do not drive until directed by your health care provider. HOME CARE INSTRUCTIONS  What family and friends can do:  To find out if someone is confused, ask the person to state his or her name, age, and the date. If the person is unsure or answers incorrectly, he or she is confused.  Always introduce yourself, no matter how well the person knows you.  Often remind the person of his or her location.  Place a calendar and clock near the confused person.  Help the person with his or her medicines. You may want to use a pill box, an alarm as a reminder, or give the person each dose as prescribed.  Talk about current events and plans for the day.  Try to keep the environment calm, quiet, and peaceful.  Make sure the person keeps follow-up visits with his or her health care provider. PREVENTION  Ways to prevent confusion:  Avoid alcohol.  Eat a balanced diet.  Get enough sleep.  Take medicine only as directed by your health care provider.  Do not become isolated. Spend time with other people and make plans for your days.  Keep careful watch on your blood sugar levels if you are diabetic. SEEK IMMEDIATE MEDICAL CARE IF:   You develop severe headaches, repeated vomiting, seizures, blackouts, or   slurred speech.  There is increasing confusion, weakness, numbness, restlessness, or personality changes.  You develop a loss of balance, have marked dizziness, feel uncoordinated, or fall.  You have delusions, hallucinations, or develop severe anxiety.  Your family members think you need to be rechecked.   This information is not intended to replace advice given  to you by your health care provider. Make sure you discuss any questions you have with your health care provider.   Document Released: 01/21/2005 Document Revised: 01/04/2015 Document Reviewed: 01/19/2014 Elsevier Interactive Patient Education 2016 [employer]  

## 2015-10-02 NOTE — ED Notes (Signed)
Pt here with his wife. Pt had an endoscopy yesterday morning and was discharged by 1100 am wife states. She states she noticed him being confused today when he got home from work today at [employer]. States he didn't recall having the procedure or where he went this morning, couldn't remember other simple things. Denies pain.

## 2015-10-02 NOTE — ED Provider Notes (Signed)
CSN: [id number]     Arrival date & time 10/02/15  1934 History   First MD Initiated Contact with Patient 10/02/15 2010     Chief Complaint  Patient presents with  . Altered Mental Status    s/p endoscopy yesterday     (Consider location/radiation/quality/duration/timing/severity/associated sxs/prior Treatment) HPI   74 year old male with some mild confusion. Onset this morning. Wife noticed the patient somewhat slow to answer questions and cannot remember things she typically can easily recall. There were some medication changes. Patient did have endoscopy yesterday morning. He seemed to be fine after this procedure though. Fevers or chills. No assistive pain. Her ears trauma. Her urinary complaints.  Past Medical History  Diagnosis Date  . Hypertension   . Chronic back pain   . Hyperlipidemia   . ED (erectile dysfunction)   . Hiatal hernia   . Diverticulosis   . Bipolar disorder ([place])   . COPD (chronic obstructive pulmonary disease) (HCC)     Dr. Laurann Montana   Past Surgical History  Procedure Laterality Date  . Tonsillectomy    . Knee arthroscopy    . Colonoscopy      removed polyps  . Cataract extraction, bilateral    . Esophagogastroduodenoscopy (egd) with propofol N/A 10/01/2015    Procedure: ESOPHAGOGASTRODUODENOSCOPY (EGD) WITH PROPOFOL;  Surgeon: Garlan Fair, MD;  Location: [facility];  Service: Endoscopy;  Laterality: N/A;  . Balloon dilation N/A 10/01/2015    Procedure: BALLOON DILATION;  Surgeon: Garlan Fair, MD;  Location: [facility];  Service: Endoscopy;  Laterality: N/A;   Family History  Problem Relation Age of Onset  . Heart attack Mother   . Cancer - Colon Mother   . CAD Mother   . Cancer Mother   . Dementia Mother   . CAD Father   . Cancer Father   . CVA Father   . Dementia Father   . Heart attack Brother   . CAD Brother    Social History  Substance Use Topics  . Smoking status: Former Research scientist (life sciences)  . Smokeless tobacco: None  . Alcohol Use:  No    Review of Systems    Allergies  Review of patient's allergies indicates no known allergies.  Home Medications   Prior to Admission medications   Medication Sig Start Date End Date Taking? Authorizing Provider  atorvastatin (LIPITOR) 40 MG tablet Take 40 mg by mouth daily.    Historical Provider, MD  Multiple Vitamin (MULTIVITAMIN) tablet Take 1 tablet by mouth daily.    Historical Provider, MD  pantoprazole (PROTONIX) 40 MG tablet Take 40 mg by mouth daily.    Historical Provider, MD  triamterene-hydrochlorothiazide (DYAZIDE) 37.5-25 MG per capsule Take 1 capsule by mouth daily. TAKE 1/2 TABLET DAILY    Historical Provider, MD   BP 153/81 mmHg  Pulse 93  Temp(Src) 98 F (36.7 C) (Oral)  Resp 20  Ht 6\' 1"  (1.854 m)  Wt 208 lb 14.4 oz (94.756 kg)  BMI 27.57 kg/m2  SpO2 97% Physical Exam  Constitutional: He is oriented to person, place, and time. He appears well-developed and well-nourished. No distress.  HENT:  Head: Normocephalic and atraumatic.  Eyes: Conjunctivae are normal. Right eye exhibits no discharge. Left eye exhibits no discharge.  Neck: Neck supple.  Cardiovascular: Normal rate, regular rhythm and normal heart sounds.  Exam reveals no gallop and no friction rub.   No murmur heard. Pulmonary/Chest: Effort normal and breath sounds normal. No respiratory distress.  Abdominal: Soft. He exhibits  no distension. There is no tenderness.  Musculoskeletal: He exhibits no edema or tenderness.  Neurological: He is alert and oriented to person, place, and time. No cranial nerve deficit. He exhibits normal muscle tone. Coordination normal.  Skin: Skin is warm and dry.  Psychiatric: He has a normal mood and affect. His behavior is normal. Thought content normal.  Nursing note and vitals reviewed.   ED Course  Procedures (including critical care time) Labs Review Labs Reviewed  CBG MONITORING, ED - Abnormal; Notable for the following:    Glucose-Capillary 179 (*)     All other components within normal limits  I-STAT CHEM 8, ED - Abnormal; Notable for the following:    Chloride 100 (*)    BUN 23 (*)    Creatinine, Ser 1.30 (*)    Glucose, Bld 172 (*)    Hemoglobin 18.0 (*)    HCT 53.0 (*)    All other components within normal limits  PROTIME-INR  APTT  CBC  DIFFERENTIAL  COMPREHENSIVE METABOLIC PANEL  URINALYSIS, ROUTINE W REFLEX MICROSCOPIC (NOT AT [facility])  I-STAT TROPOININ, ED    Imaging Review No results found. I have personally reviewed and evaluated these images and lab results as part of my medical decision-making.   EKG Interpretation None      MDM   Final diagnoses:  Acute confusional state     74 year old male with an acute confusional state. Doubt related to meds he received received with endoscopy yesterday. Afebrile. Appears well. Focal exam. Workup was reportedly unremarkable. Patient's mental status actually improved mobility of the emergency room. He has no further complaints.It has been determined that no acute conditions requiring further emergency intervention are present at this time. The patient has been advised of the diagnosis and plan. I reviewed any labs and imaging including any potential incidental findings. We have discussed signs and symptoms that warrant return to the ED and they are listed in the discharge instructions.      Virgel Manifold, MD 10/10/15 1024

## 2015-12-03 DIAGNOSIS — Z961 Presence of intraocular lens: Secondary | ICD-10-CM | POA: Diagnosis not present

## 2015-12-03 DIAGNOSIS — H35373 Puckering of macula, bilateral: Secondary | ICD-10-CM | POA: Diagnosis not present

## 2015-12-03 DIAGNOSIS — H43813 Vitreous degeneration, bilateral: Secondary | ICD-10-CM | POA: Diagnosis not present

## 2015-12-25 DIAGNOSIS — I10 Essential (primary) hypertension: Secondary | ICD-10-CM | POA: Diagnosis not present

## 2015-12-25 DIAGNOSIS — J449 Chronic obstructive pulmonary disease, unspecified: Secondary | ICD-10-CM | POA: Diagnosis not present

## 2015-12-25 DIAGNOSIS — E119 Type 2 diabetes mellitus without complications: Secondary | ICD-10-CM | POA: Diagnosis not present

## 2016-05-05 DIAGNOSIS — S161XXS Strain of muscle, fascia and tendon at neck level, sequela: Secondary | ICD-10-CM | POA: Diagnosis not present

## 2016-05-05 DIAGNOSIS — S134XXA Sprain of ligaments of cervical spine, initial encounter: Secondary | ICD-10-CM | POA: Diagnosis not present

## 2016-05-05 DIAGNOSIS — M9901 Segmental and somatic dysfunction of cervical region: Secondary | ICD-10-CM | POA: Diagnosis not present

## 2016-05-05 DIAGNOSIS — M542 Cervicalgia: Secondary | ICD-10-CM | POA: Diagnosis not present

## 2016-06-02 DIAGNOSIS — M542 Cervicalgia: Secondary | ICD-10-CM | POA: Diagnosis not present

## 2016-06-02 DIAGNOSIS — S161XXS Strain of muscle, fascia and tendon at neck level, sequela: Secondary | ICD-10-CM | POA: Diagnosis not present

## 2016-06-02 DIAGNOSIS — M9901 Segmental and somatic dysfunction of cervical region: Secondary | ICD-10-CM | POA: Diagnosis not present

## 2016-06-02 DIAGNOSIS — S134XXA Sprain of ligaments of cervical spine, initial encounter: Secondary | ICD-10-CM | POA: Diagnosis not present

## 2016-06-29 ENCOUNTER — Other Ambulatory Visit: Payer: Self-pay | Admitting: Internal Medicine

## 2016-06-29 DIAGNOSIS — Z09 Encounter for follow-up examination after completed treatment for conditions other than malignant neoplasm: Secondary | ICD-10-CM

## 2016-06-29 DIAGNOSIS — E782 Mixed hyperlipidemia: Secondary | ICD-10-CM | POA: Diagnosis not present

## 2016-06-29 DIAGNOSIS — Z0000 Encounter for general adult medical examination without abnormal findings: Secondary | ICD-10-CM | POA: Diagnosis not present

## 2016-06-29 DIAGNOSIS — J449 Chronic obstructive pulmonary disease, unspecified: Secondary | ICD-10-CM | POA: Diagnosis not present

## 2016-06-29 DIAGNOSIS — I714 Abdominal aortic aneurysm, without rupture: Secondary | ICD-10-CM | POA: Diagnosis not present

## 2016-06-29 DIAGNOSIS — I10 Essential (primary) hypertension: Secondary | ICD-10-CM | POA: Diagnosis not present

## 2016-06-29 DIAGNOSIS — E119 Type 2 diabetes mellitus without complications: Secondary | ICD-10-CM | POA: Diagnosis not present

## 2016-06-29 DIAGNOSIS — Z1389 Encounter for screening for other disorder: Secondary | ICD-10-CM | POA: Diagnosis not present

## 2016-07-13 ENCOUNTER — Ambulatory Visit
Admission: RE | Admit: 2016-07-13 | Discharge: 2016-07-13 | Disposition: A | Discharge status: Home / Self Care | Payer: Medicare Other | Source: Ambulatory Visit | Attending: Internal Medicine | Admitting: Internal Medicine

## 2016-07-13 DIAGNOSIS — I714 Abdominal aortic aneurysm, without rupture: Secondary | ICD-10-CM | POA: Diagnosis not present

## 2016-07-13 DIAGNOSIS — Z09 Encounter for follow-up examination after completed treatment for conditions other than malignant neoplasm: Secondary | ICD-10-CM

## 2016-07-23 ENCOUNTER — Other Ambulatory Visit: Payer: Self-pay | Admitting: Gastroenterology

## 2016-08-06 ENCOUNTER — Encounter (HOSPITAL_COMMUNITY): Payer: Self-pay | Admitting: Emergency Medicine

## 2016-08-06 ENCOUNTER — Ambulatory Visit (HOSPITAL_COMMUNITY)
Admission: EM | Admit: 2016-08-06 | Discharge: 2016-08-06 | Disposition: A | Discharge status: Home / Self Care | Payer: Medicare Other | Attending: Emergency Medicine | Admitting: Emergency Medicine

## 2016-08-06 DIAGNOSIS — W298XXA Contact with other powered powered hand tools and household machinery, initial encounter: Secondary | ICD-10-CM | POA: Diagnosis not present

## 2016-08-06 DIAGNOSIS — S61219A Laceration without foreign body of unspecified finger without damage to nail, initial encounter: Secondary | ICD-10-CM | POA: Diagnosis not present

## 2016-08-06 MED ORDER — TETANUS-DIPHTH-ACELL PERTUSSIS 5-2.5-18.5 LF-MCG/0.5 IM SUSP
INTRAMUSCULAR | Status: AC
  Filled 2016-08-06: qty 0.5

## 2016-08-06 MED ORDER — TETANUS-DIPHTH-ACELL PERTUSSIS 5-2.5-18.5 LF-MCG/0.5 IM SUSP
0.5000 mL | Freq: Once | INTRAMUSCULAR | Status: AC
  Administered 2016-08-06: 0.5 mL via INTRAMUSCULAR

## 2016-08-06 NOTE — Discharge Instructions (Signed)
We applied 7 sutures today to your right index finger into your hand. Keep hand and fingers dry for next 24 hours. Then clean with soap and water and apply topical triple antibiotic ointment as provided and keep covered. Use gloves when working. Follow-up in 10 to 14 days for suture removal.

## 2016-08-06 NOTE — ED Triage Notes (Signed)
Patient reports injuring right hand today with a drill bit.  Bleeding controlled, laceration visible

## 2016-08-06 NOTE — ED Provider Notes (Signed)
CSN: [id number]     Arrival date & time 08/06/16  1621 History   First MD Initiated Contact with Patient 08/06/16 1834     Chief Complaint  Patient presents with  . Laceration   (Consider location/radiation/quality/duration/timing/severity/associated sxs/prior Treatment) Patient presents with injury to right index finger and palm about 3 hours ago. He was using a drill when the bit hit and cut his hand. Bleeding is under control with pressure. He denies any numbness. He has full range of motion of hand and finger. He has not taken anything for pain. He is accompanied by his wife in the room.    The history is provided by the patient and the spouse.  Laceration  Location:  Finger Finger laceration location:  R index finger Length:  3cm Depth:  Through dermis Quality: jagged and straight   Bleeding: venous   Time since incident:  2 hours Injury mechanism: drill bit. Pain details:    Quality:  Throbbing   Severity:  Moderate   Timing:  Constant   Progression:  Unchanged Foreign body present:  No foreign bodies Tetanus status:  Unknown Associated symptoms: no fever, no numbness, no redness and no swelling     Past Medical History:  Diagnosis Date  . Bipolar disorder ([place])   . Chronic back pain   . COPD (chronic obstructive pulmonary disease) (HCC)    Dr. Laurann Montana  . Diverticulosis   . ED (erectile dysfunction)   . Hiatal hernia   . Hyperlipidemia   . Hypertension    Past Surgical History:  Procedure Laterality Date  . BALLOON DILATION N/A 10/01/2015   Procedure: BALLOON DILATION;  Surgeon: Garlan Fair, MD;  Location: [facility];  Service: Endoscopy;  Laterality: N/A;  . CATARACT EXTRACTION, BILATERAL    . COLONOSCOPY     removed polyps  . ESOPHAGOGASTRODUODENOSCOPY (EGD) WITH PROPOFOL N/A 10/01/2015   Procedure: ESOPHAGOGASTRODUODENOSCOPY (EGD) WITH PROPOFOL;  Surgeon: Garlan Fair, MD;  Location: [facility];  Service: Endoscopy;  Laterality: N/A;  . KNEE  ARTHROSCOPY    . TONSILLECTOMY     Family History  Problem Relation Age of Onset  . Heart attack Mother   . Cancer - Colon Mother   . CAD Mother   . Cancer Mother   . Dementia Mother   . CAD Father   . Cancer Father   . CVA Father   . Dementia Father   . Heart attack Brother   . CAD Brother    Social History  Substance Use Topics  . Smoking status: Former Research scientist (life sciences)  . Smokeless tobacco: Not on file  . Alcohol use No    Review of Systems  Constitutional: Negative for fever.  Skin: Positive for wound.  Neurological: Negative for syncope and numbness.    Allergies  Review of patient's allergies indicates no known allergies.  Home Medications   Prior to Admission medications   Medication Sig Start Date End Date Taking? Authorizing Provider  atorvastatin (LIPITOR) 40 MG tablet Take 40 mg by mouth daily at 6 PM.     Historical Provider, MD  Multiple Vitamin (MULTIVITAMIN) tablet Take 1 tablet by mouth daily.    Historical Provider, MD  pantoprazole (PROTONIX) 40 MG tablet Take 40 mg by mouth daily.    Historical Provider, MD  triamterene-hydrochlorothiazide (DYAZIDE) 37.5-25 MG per capsule Take 1 capsule by mouth daily. TAKE 1/2 TABLET DAILY    Historical Provider, MD  vitamin E 1000 UNIT capsule Take 2,000 Units by mouth daily.  Historical Provider, MD   Meds Ordered and Administered this Visit   Medications  Tdap (BOOSTRIX) injection 0.5 mL (0.5 mLs Intramuscular Given 08/06/16 1900)    BP 164/96 (BP Location: Left Arm)   Pulse 89   Temp 97.4 F (36.3 C) (Oral)   Resp 16   SpO2 97%  No data found.   Physical Exam  Constitutional: He is oriented to person, place, and time. He appears well-developed and well-nourished. No distress.  Musculoskeletal: Normal range of motion.       Right hand: He exhibits tenderness and laceration. He exhibits normal range of motion and normal capillary refill. Normal sensation noted. Normal strength noted.       Hands: About a 3cm  laceration with jagged edge distally on his index ringer and about 51mm deep. Some soft tissue protruding slightly with gaping wound. No tendon involvement. Has full range of motion of hand and fingers.   Neurological: He is alert and oriented to person, place, and time.  Skin: Skin is warm and dry.  Psychiatric: He has a normal mood and affect. His behavior is normal. Judgment and thought content normal.    Urgent Care Course   Clinical Course    .Marland KitchenLaceration Repair Date/Time: 08/06/2016 6:49 PM Performed by: Melvyn Neth, Anne [place] BERRY Authorized by: Melony Overly   Consent:    Consent obtained:  Verbal   Consent given by:  Patient and spouse   Risks discussed:  Infection, pain and poor wound healing   Alternatives discussed:  No treatment Anesthesia (see MAR for exact dosages):    Anesthesia method:  Local infiltration   Local anesthetic:  Bupivacaine 0.5% w/o epi Laceration details:    Location:  Finger   Finger location:  R index finger   Length (cm):  3   Depth (mm):  4 Repair type:    Repair type:  Simple Pre-procedure details:    Preparation:  Patient was prepped and draped in usual sterile fashion Exploration:    Hemostasis achieved with:  Direct pressure   Wound exploration: entire depth of wound probed and visualized     Wound extent: no foreign bodies/material noted, no muscle damage noted, no nerve damage noted, no tendon damage noted, no underlying fracture noted and no vascular damage noted     Contaminated: no   Treatment:    Area cleansed with:  Hibiclens and Betadine   Amount of cleaning:  Standard   Irrigation solution:  Sterile saline   Irrigation volume:  31ml   Irrigation method:  Syringe   Foreign body removal: no foreign bodies present.   Skin repair:    Repair method:  Sutures   Suture size:  4-0   Suture material:  Nylon   Suture technique:  Simple interrupted   Number of sutures:  7 Approximation:    Approximation:  Close   V[place]ilion border:  well-aligned   Post-procedure details:    Dressing:  Antibiotic ointment, non-adherent dressing and bulky dressing   Patient tolerance of procedure:  Tolerated well, no immediate complications Comments:     Removed with scissors small flap of top layer of skin near jagged edge on right index finger. Wound closure well-tolerated. No complications. Dietrich Pates, NP assisted with procedure.     (including critical care time)  Labs Review Labs Reviewed - No data to display  Imaging Review No results found.   Visual Acuity Review  Right Eye Distance:   Left Eye Distance:   Bilateral Distance:  Right Eye Near:   Left Eye Near:    Bilateral Near:         MDM   1. Finger laceration, initial encounter    Consulted with Dietrich Pates, NP.    Tdap given since uncertain when last Tetanus vaccine.   Sutures placed as above. Discussed keeping area dry and clean for next 24 hours. Then may wash area gently with soap and water. Apply Bacitracin ointment over area and cover with bandage for next 5 to 7 days. Wear gloves at work. May take Tylenol as needed for pain. Return for recheck if any redness, swelling, discharge or increased pain occurs at site. Otherwise follow-up here in 10 to 14 days for suture removal.     Katy Apo, NP 08/07/16 0030

## 2016-08-16 ENCOUNTER — Encounter (HOSPITAL_COMMUNITY): Payer: Self-pay | Admitting: *Deleted

## 2016-08-16 ENCOUNTER — Ambulatory Visit (HOSPITAL_COMMUNITY)
Admission: EM | Admit: 2016-08-16 | Discharge: 2016-08-16 | Disposition: A | Discharge status: Home / Self Care | Payer: Medicare Other | Attending: Family Medicine | Admitting: Family Medicine

## 2016-08-16 DIAGNOSIS — Z4802 Encounter for removal of sutures: Secondary | ICD-10-CM | POA: Diagnosis not present

## 2016-08-16 DIAGNOSIS — S61432D Puncture wound without foreign body of left hand, subsequent encounter: Secondary | ICD-10-CM

## 2016-08-16 DIAGNOSIS — L089 Local infection of the skin and subcutaneous tissue, unspecified: Secondary | ICD-10-CM

## 2016-08-16 MED ORDER — BACITRACIN ZINC 500 UNIT/GM EX OINT
TOPICAL_OINTMENT | CUTANEOUS | Status: AC
  Filled 2016-08-16: qty 1.8

## 2016-08-16 MED ORDER — DOXYCYCLINE HYCLATE 100 MG PO CAPS: 100.0000 mg | ORAL_CAPSULE | Freq: Two times a day (BID) | ORAL | 0 refills | Status: DC

## 2016-08-16 MED ORDER — MUPIROCIN 2 % EX OINT: 1.0000 "application " | TOPICAL_OINTMENT | Freq: Three times a day (TID) | CUTANEOUS | 1 refills | Status: DC

## 2016-08-16 NOTE — ED Provider Notes (Signed)
[facility]    CSN: [id number] Arrival date & time: 08/16/16  1200  First Provider Contact:  First MD Initiated Contact with Patient 08/16/16 1250        History   Chief Complaint Chief Complaint  Patient presents with  . Suture / Staple Removal    HPI Jahden Blackwood. is a 75 y.o. male.   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 He lacerated his left palm with a drill on August 10. He was evaluated and sutured at this facility.  Patient has had pain and discharge starting about 2 days after the wound repair. He's having significant amount of pain as well as swelling and discharge.        Past Medical History:  Diagnosis Date  . Bipolar disorder ([place])   . Chronic back pain   . COPD (chronic obstructive pulmonary disease) (HCC)    Dr. Laurann Montana  . Diverticulosis   . ED (erectile dysfunction)   . Hiatal hernia   . Hyperlipidemia   . Hypertension     There are no active problems to display for this patient.   Past Surgical History:  Procedure Laterality Date  . BALLOON DILATION N/A 10/01/2015   Procedure: BALLOON DILATION;  Surgeon: Garlan Fair, MD;  Location: [facility];  Service: Endoscopy;  Laterality: N/A;  . CATARACT EXTRACTION, BILATERAL    . COLONOSCOPY     removed polyps  . ESOPHAGOGASTRODUODENOSCOPY (EGD) WITH PROPOFOL N/A 10/01/2015   Procedure: ESOPHAGOGASTRODUODENOSCOPY (EGD) WITH PROPOFOL;  Surgeon: Garlan Fair, MD;  Location: [facility];  Service: Endoscopy;  Laterality: N/A;  . KNEE ARTHROSCOPY    . TONSILLECTOMY         Home Medications    Prior to Admission medications   Medication Sig Start Date End Date Taking? Authorizing Provider  atorvastatin (LIPITOR) 40 MG tablet Take 40 mg by mouth daily at 6 PM.    Yes Historical Provider, MD  Multiple Vitamin (MULTIVITAMIN) tablet Take 1 tablet by mouth daily.   Yes Historical Provider, MD  pantoprazole (PROTONIX) 40 MG tablet Take 40 mg by  mouth daily.   Yes Historical Provider, MD  triamterene-hydrochlorothiazide (DYAZIDE) 37.5-25 MG per capsule Take 1 capsule by mouth daily. TAKE 1/2 TABLET DAILY   Yes Historical Provider, MD  vitamin E 1000 UNIT capsule Take 2,000 Units by mouth daily.   Yes Historical Provider, MD    Family History Family History  Problem Relation Age of Onset  . Heart attack Mother   . Cancer - Colon Mother   . CAD Mother   . Cancer Mother   . Dementia Mother   . CAD Father   . Cancer Father   . CVA Father   . Dementia Father   . Heart attack Brother   . CAD Brother     Social History Social History  Substance Use Topics  . Smoking status: Former Research scientist (life sciences)  . Smokeless tobacco: Not on file  . Alcohol use No     Allergies   Review of patient's allergies indicates no known allergies.   Review of Systems Review of Systems   Physical Exam Triage Vital Signs ED Triage Vitals  Enc Vitals Group     BP 08/16/16 1215 155/75     Pulse Rate 08/16/16 1215 93     Resp 08/16/16 1215 18     Temp 08/16/16 1215 97.2 F (36.2 C)     Temp Source 08/16/16 1215 Oral  SpO2 08/16/16 1215 98 %     Weight --      Height --      Head Circumference --      Peak Flow --      Pain Score 08/16/16 1216 0     Pain Loc --      Pain Edu? --      Excl. in [place]? --    No data found.   Updated Vital Signs BP 155/75   Pulse 93   Temp 97.2 F (36.2 C) (Oral)   Resp 18   SpO2 98%   Visual Acuity Right Eye Distance:   Left Eye Distance:   Bilateral Distance:    Right Eye Near:   Left Eye Near:    Bilateral Near:     Physical Exam  Constitutional: He appears well-developed.  Skin: Skin is warm.  The left palm wound is separated and draining purulent material. There are several sutures remaining. These were removed. The wound has about 4 mm of dehiscence.  Patient has full range of motion of his hand. The wound was cleansed and dressed by the nurse.  Nursing note and vitals  reviewed.    UC Treatments / Results  Labs (all labs ordered are listed, but only abnormal results are displayed) Labs Reviewed - No data to display  EKG  EKG Interpretation None       Radiology No results found.  Procedures Procedures (including critical care time)  Medications Ordered in UC Medications - No data to display   Initial Impression / Assessment and Plan / UC Course  I have reviewed the triage vital signs and the nursing notes.  Pertinent labs & imaging results that were available during my care of the patient were reviewed by me and considered in my medical decision making (see chart for details).  Clinical Course   Final Clinical Impressions(s) / UC Diagnoses   Final diagnoses:  None    New Prescriptions New Prescriptions   No medications on file     Robyn Haber, MD 08/16/16 1256

## 2016-08-16 NOTE — Discharge Instructions (Signed)
Please return on Wednesday for wound check. I'm starting you on antibiotics by mouth as well as topical antibiotics.  Please wash her hands well 3 times a day and then apply the cream that was prescribed. Take the antibiotic by mouth twice a day and follow with a large glass of water or other beverage to prevent the pill from getting stuck in your throat.

## 2016-08-16 NOTE — ED Triage Notes (Signed)
Sutures placed in anterior right hand 8/10.  Pt denies any c/o's.  Upon examination of wound; states 2 central sutures broke.  Has been changing dressing daily and applying abx oint.  Wound not approximated well; small areas white skin with moisture noted; small amount yellowish drainage.  No redness or pain.

## 2016-08-19 ENCOUNTER — Ambulatory Visit (HOSPITAL_COMMUNITY)
Admission: EM | Admit: 2016-08-19 | Discharge: 2016-08-19 | Disposition: A | Discharge status: Home / Self Care | Payer: Medicare Other | Attending: Physician Assistant | Admitting: Physician Assistant

## 2016-08-19 DIAGNOSIS — Z4801 Encounter for change or removal of surgical wound dressing: Secondary | ICD-10-CM

## 2016-08-19 DIAGNOSIS — IMO0002 Reserved for concepts with insufficient information to code with codable children: Secondary | ICD-10-CM

## 2016-08-19 NOTE — ED Notes (Signed)
Pt triaged, assessed, and d/c'd by Linde Gillis, PA.  I applied dry dressing to pt's right hand.  Wound was clean, dry and intact.

## 2016-08-19 NOTE — Discharge Instructions (Signed)
CONTINUE TO KEEP WOUND CLEAN  FINISH ANTIBIOTICS  RETURN IF THERE ARE NEW OR WORSENING OF SYMPTOMS.

## 2016-08-19 NOTE — ED Provider Notes (Signed)
CSN: [id number]     Arrival date & time 08/19/16  1554 History   First MD Initiated Contact with Patient 08/19/16 1621     No chief complaint on file.  (Consider location/radiation/quality/duration/timing/severity/associated sxs/prior Treatment) HPI 75 Y/O MALE WAS SEEN A COUPLE OF DAYS AGO FOR WOUND INFECTION, IS BACK TODAY FOR WOUND CHECK. STATES HE IS DOING MUCH BETTER. NO PROBLEMS WITH WOUND AT HOME. Past Medical History:  Diagnosis Date  . Bipolar disorder ([place])   . Chronic back pain   . COPD (chronic obstructive pulmonary disease) ([facility])    Dr. Laurann Montana  . Diverticulosis   . ED (erectile dysfunction)   . Hiatal hernia   . Hyperlipidemia   . Hypertension    Past Surgical History:  Procedure Laterality Date  . BALLOON DILATION N/A 10/01/2015   Procedure: BALLOON DILATION;  Surgeon: Garlan Fair, MD;  Location: [facility];  Service: Endoscopy;  Laterality: N/A;  . CATARACT EXTRACTION, BILATERAL    . COLONOSCOPY     removed polyps  . ESOPHAGOGASTRODUODENOSCOPY (EGD) WITH PROPOFOL N/A 10/01/2015   Procedure: ESOPHAGOGASTRODUODENOSCOPY (EGD) WITH PROPOFOL;  Surgeon: Garlan Fair, MD;  Location: [facility];  Service: Endoscopy;  Laterality: N/A;  . KNEE ARTHROSCOPY    . TONSILLECTOMY     Family History  Problem Relation Age of Onset  . Heart attack Mother   . Cancer - Colon Mother   . CAD Mother   . Cancer Mother   . Dementia Mother   . CAD Father   . Cancer Father   . CVA Father   . Dementia Father   . Heart attack Brother   . CAD Brother    Social History  Substance Use Topics  . Smoking status: Former Research scientist (life sciences)  . Smokeless tobacco: Not on file  . Alcohol use No    Review of Systems  Denies: HEADACHE, NAUSEA, ABDOMINAL PAIN, CHEST PAIN, CONGESTION, DYSURIA, SHORTNESS OF BREATH  Allergies  Review of patient's allergies indicates no known allergies.  Home Medications   Prior to Admission medications   Medication Sig Start Date End Date Taking?  Authorizing Provider  atorvastatin (LIPITOR) 40 MG tablet Take 40 mg by mouth daily at 6 PM.     Historical Provider, MD  doxycycline (VIBRAMYCIN) 100 MG capsule Take 1 capsule (100 mg total) by mouth 2 (two) times daily. 08/16/16   Robyn Haber, MD  Multiple Vitamin (MULTIVITAMIN) tablet Take 1 tablet by mouth daily.    Historical Provider, MD  mupirocin ointment (BACTROBAN) 2 % Apply 1 application topically 3 (three) times daily. 08/16/16   Robyn Haber, MD  pantoprazole (PROTONIX) 40 MG tablet Take 40 mg by mouth daily.    Historical Provider, MD  triamterene-hydrochlorothiazide (DYAZIDE) 37.5-25 MG per capsule Take 1 capsule by mouth daily. TAKE 1/2 TABLET DAILY    Historical Provider, MD  vitamin E 1000 UNIT capsule Take 2,000 Units by mouth daily.    Historical Provider, MD   Meds Ordered and Administered this Visit  Medications - No data to display  BP 145/85 (BP Location: Left Arm)   Pulse 84   Temp 97.8 F (36.6 C) (Oral)   Resp 18   SpO2 98%  No data found.   Physical Exam NURSES NOTES AND VITAL SIGNS REVIEWED. CONSTITUTIONAL: Well developed, well nourished, no acute distress HEENT: normocephalic, atraumatic EYES: Conjunctiva normal NECK:normal ROM, supple, no adenopathy PULMONARY:No respiratory distress, normal effort ABDOMINAL: Soft, ND, NT BS+, No CVAT MUSCULOSKELETAL: Normal ROM of all extremities, RIGHT  HAND THERE IS A HEALED WOUND WITHOUT TENDERNESS OF REDNESS.  SKIN: warm and dry without rash PSYCHIATRIC: Mood and affect, behavior are normal  Urgent Care Course   Clinical Course    Procedures (including critical care time)  Labs Review Labs Reviewed - No data to display  Imaging Review No results found.   Visual Acuity Review  Right Eye Distance:   Left Eye Distance:   Bilateral Distance:    Right Eye Near:   Left Eye Near:    Bilateral Near:        PT'S WOUND LOOKS GOOD, CONTINUE ANTIBX AND SYMPTOMATIC CARE.  MDM   1. Encounter for  re-check of laceration wound     Patient is reassured that there are no issues that require transfer to higher level of care at this time or additional tests. Patient is advised to continue home symptomatic treatment. Patient is advised that if there are new or worsening symptoms to attend the emergency department, contact primary care provider, or return to UC. Instructions of care provided discharged home in stable condition.    THIS NOTE WAS GENERATED USING A VOICE RECOGNITION SOFTWARE PROGRAM. ALL REASONABLE EFFORTS  WERE MADE TO PROOFREAD THIS DOCUMENT FOR ACCURACY.  I have verbally reviewed the discharge instructions with the patient. A printed AVS was given to the patient.  All questions were answered prior to discharge.      Konrad Felix, PA 08/19/16 2116

## 2016-08-25 IMAGING — DX DG CHEST 2V
2 series · 2 of 2 positions shown · non-contrast
Comparison: Chest radiograph performed 09/03/2008

CLINICAL DATA: Acute onset of altered mental status. Recent
dilatation of esophagus. Confusion. Initial encounter.

EXAM:
CHEST  2 VIEW

[chest pa]
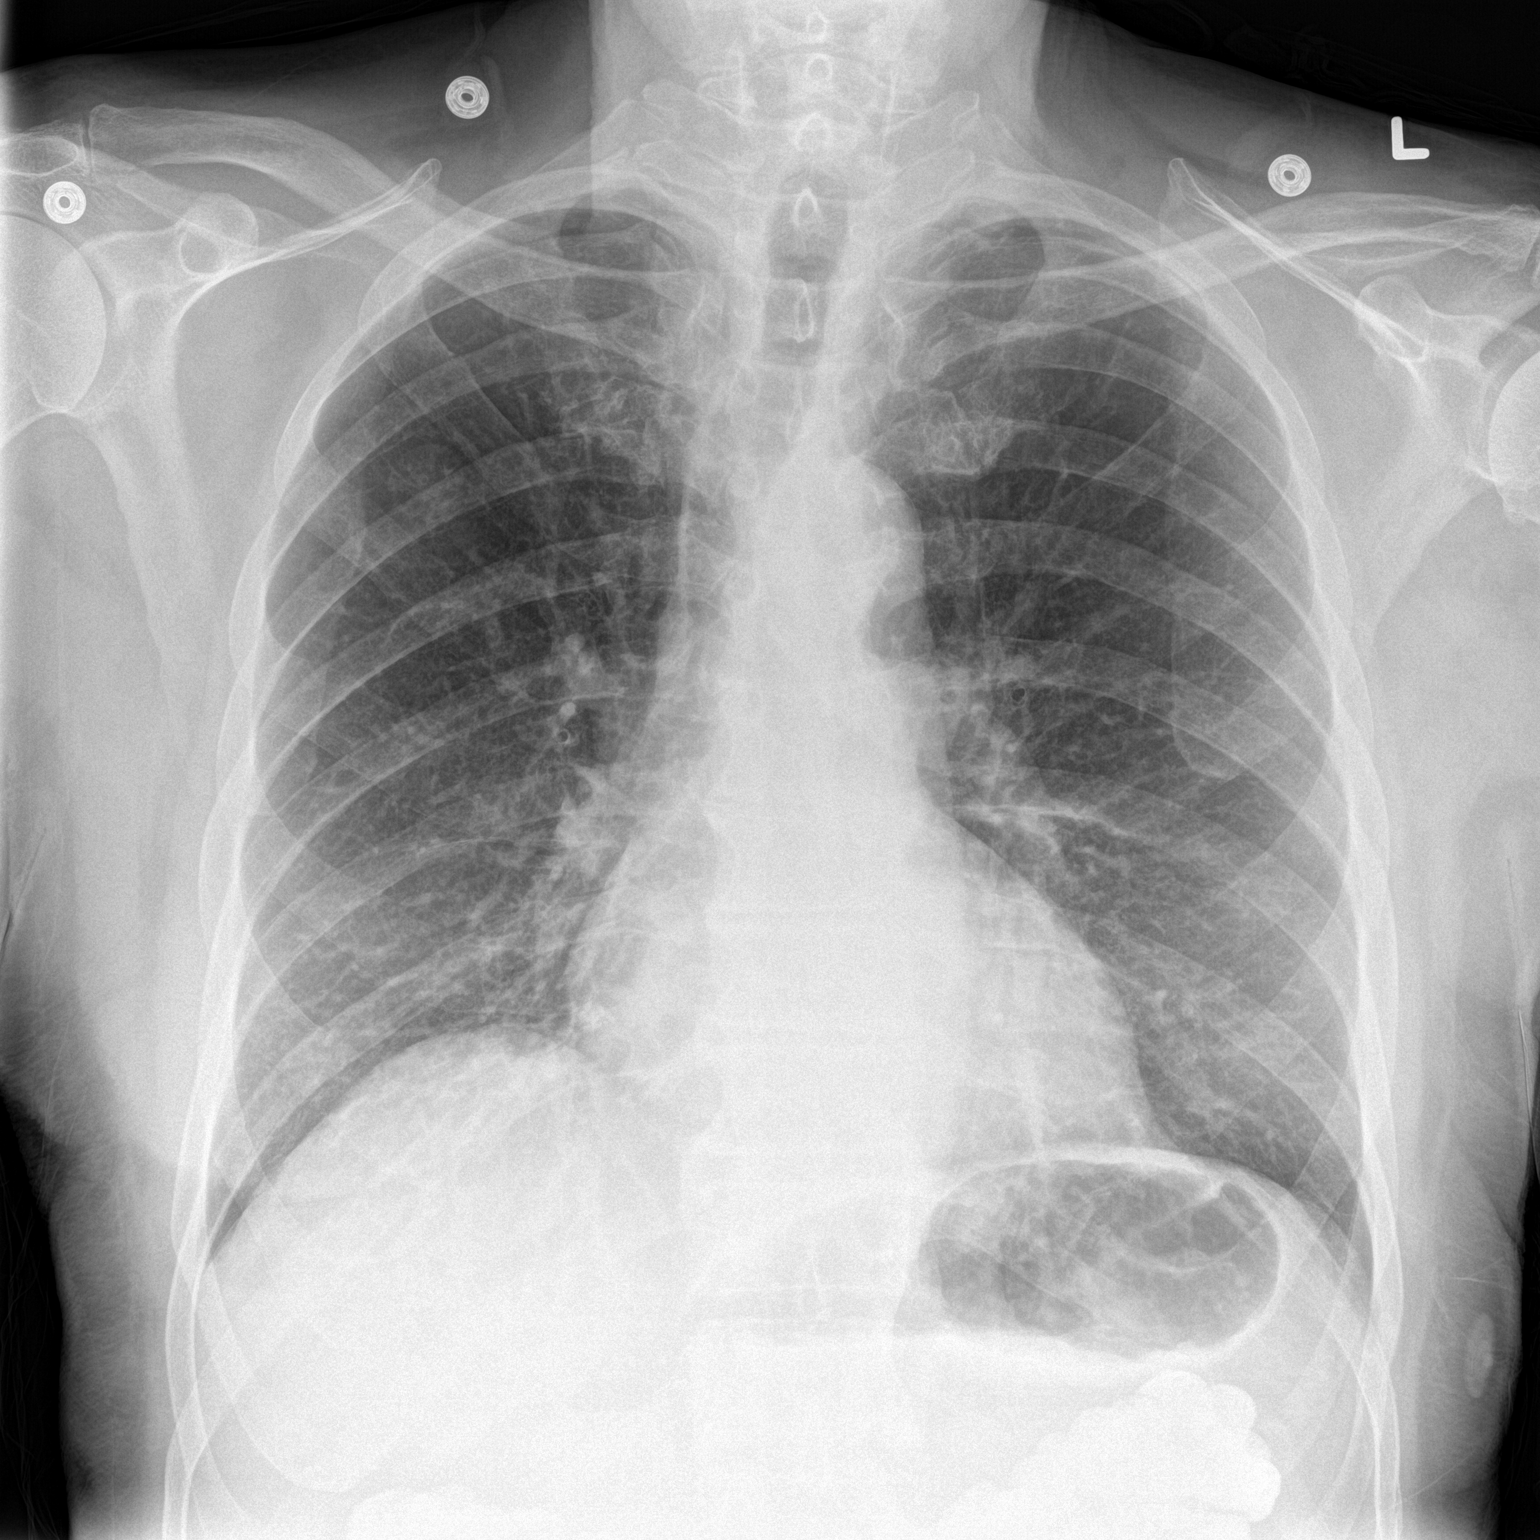

[chest lat]
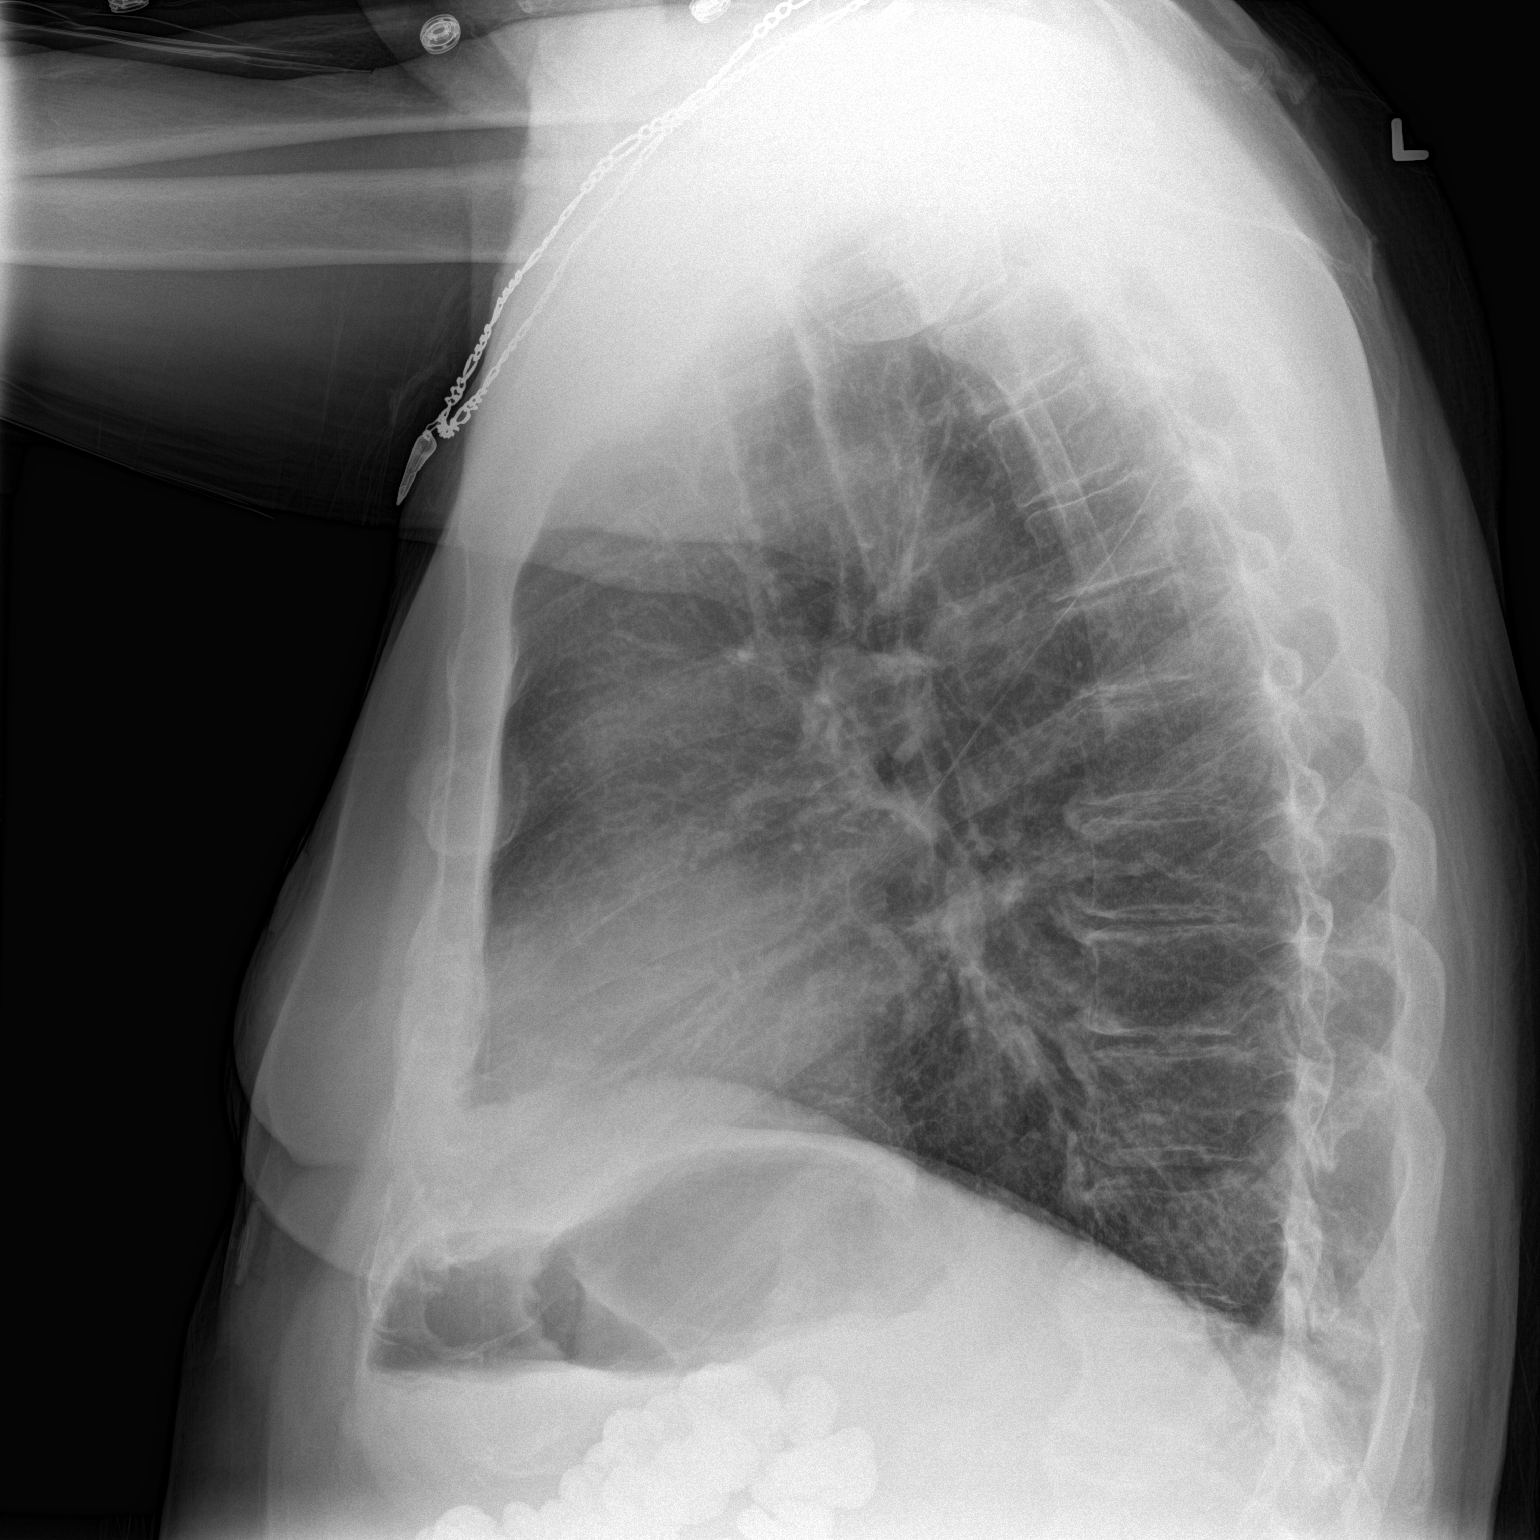

[2 of 2 positions shown; findings below may reference images not displayed]

FINDINGS: The lungs are well-aerated. Mild vascular congestion is noted, with
mild bibasilar atelectasis. There is no evidence of pleural effusion
or pneumothorax.

The heart is normal in size; the mediastinal contour is within
normal limits. No acute osseous abnormalities are seen. Contrast is
noted within the colon.
IMPRESSION: Mild vascular congestion, with mild bibasilar atelectasis.

## 2016-08-26 ENCOUNTER — Encounter (HOSPITAL_COMMUNITY): Payer: Self-pay | Admitting: *Deleted

## 2016-09-01 ENCOUNTER — Encounter (HOSPITAL_COMMUNITY)
Admission: RE | Disposition: A | Discharge status: Home / Self Care | Payer: Self-pay | Source: Ambulatory Visit | Attending: Gastroenterology

## 2016-09-01 ENCOUNTER — Ambulatory Visit (HOSPITAL_COMMUNITY): Payer: Medicare Other | Admitting: Anesthesiology

## 2016-09-01 ENCOUNTER — Encounter (HOSPITAL_COMMUNITY): Payer: Self-pay | Admitting: Anesthesiology

## 2016-09-01 ENCOUNTER — Ambulatory Visit (HOSPITAL_COMMUNITY)
Admission: RE | Admit: 2016-09-01 | Discharge: 2016-09-01 | Disposition: A | Discharge status: Home / Self Care | Payer: Medicare Other | Source: Ambulatory Visit | Attending: Gastroenterology | Admitting: Gastroenterology

## 2016-09-01 DIAGNOSIS — I714 Abdominal aortic aneurysm, without rupture: Secondary | ICD-10-CM | POA: Insufficient documentation

## 2016-09-01 DIAGNOSIS — J449 Chronic obstructive pulmonary disease, unspecified: Secondary | ICD-10-CM | POA: Insufficient documentation

## 2016-09-01 DIAGNOSIS — E7800 Pure hypercholesterolemia, unspecified: Secondary | ICD-10-CM | POA: Diagnosis not present

## 2016-09-01 DIAGNOSIS — Z79899 Other long term (current) drug therapy: Secondary | ICD-10-CM | POA: Insufficient documentation

## 2016-09-01 DIAGNOSIS — K5790 Diverticulosis of intestine, part unspecified, without perforation or abscess without bleeding: Secondary | ICD-10-CM | POA: Diagnosis not present

## 2016-09-01 DIAGNOSIS — Z800 Family history of malignant neoplasm of digestive organs: Secondary | ICD-10-CM | POA: Insufficient documentation

## 2016-09-01 DIAGNOSIS — I10 Essential (primary) hypertension: Secondary | ICD-10-CM | POA: Insufficient documentation

## 2016-09-01 DIAGNOSIS — Z1211 Encounter for screening for malignant neoplasm of colon: Secondary | ICD-10-CM | POA: Diagnosis not present

## 2016-09-01 DIAGNOSIS — Z87891 Personal history of nicotine dependence: Secondary | ICD-10-CM | POA: Diagnosis not present

## 2016-09-01 DIAGNOSIS — E119 Type 2 diabetes mellitus without complications: Secondary | ICD-10-CM | POA: Diagnosis not present

## 2016-09-01 HISTORY — PX: COLONOSCOPY WITH PROPOFOL: SHX5780

## 2016-09-01 SURGERY — COLONOSCOPY WITH PROPOFOL
Anesthesia: Monitor Anesthesia Care

## 2016-09-01 MED ORDER — LIDOCAINE 2% (20 MG/ML) 5 ML SYRINGE
INTRAMUSCULAR | Status: DC | PRN
  Administered 2016-09-01: 100 mg via INTRAVENOUS

## 2016-09-01 MED ORDER — LACTATED RINGERS IV SOLN
INTRAVENOUS | Status: DC
  Administered 2016-09-01: 07:00:00 via INTRAVENOUS

## 2016-09-01 MED ORDER — LIDOCAINE 2% (20 MG/ML) 5 ML SYRINGE
INTRAMUSCULAR | Status: AC
  Filled 2016-09-01: qty 5

## 2016-09-01 MED ORDER — PROPOFOL 10 MG/ML IV BOLUS
INTRAVENOUS | Status: DC | PRN
  Administered 2016-09-01 (×2): 20 mg via INTRAVENOUS

## 2016-09-01 MED ORDER — PROPOFOL 500 MG/50ML IV EMUL
INTRAVENOUS | Status: DC | PRN
  Administered 2016-09-01: 140 ug/kg/min via INTRAVENOUS

## 2016-09-01 MED ORDER — PROPOFOL 10 MG/ML IV BOLUS
INTRAVENOUS | Status: AC
  Filled 2016-09-01: qty 40

## 2016-09-01 MED ORDER — SODIUM CHLORIDE 0.9 % IV SOLN
INTRAVENOUS | Status: DC
Start: 2016-09-01 — End: 2016-09-01

## 2016-09-01 SURGICAL SUPPLY — 21 items

## 2016-09-01 NOTE — H&P (Signed)
  Procedure: Screening colonoscopy. Normal screening colonoscopies were performed in 2006 and 2011. Father was diagnosed with colon cancer unknown age.  History: The patient is a 75 year old male born [birth year]. He is scheduled to undergo a repeat screening colonoscopy today.  Past medical history: Chronic obstructive pulmonary disease. Type 2 diabetes mellitus diagnosed in 2014. Hypertension. Degenerative disc disease of the lumbar spine. Benign prostatic hypertrophy. Hypercholesterolemia. Dysphagia associated with distal esophageal spasm. Bipolar disorder. 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 Tonsillectomy. Left knee arthroscopy. Right inguinal herniorrhaphy. Bilateral cataract extractions.  Medication allergies: None  Family history: I'll her was diagnosed with colon cancer and esophageal cancer. Mother was diagnosed with pancreatic cancer.  Exam: The patient is alert and lying comfortably on the endoscopy stretcher. Abdomen is soft and nontender to palpation. Lungs clear to auscultation. Cardiac exam reveals a regular rhythm.  Plan: Proceed with screening colonoscopy

## 2016-09-01 NOTE — Transfer of Care (Signed)
Immediate Anesthesia Transfer of Care Note  Patient: Douglas Hurst.  Procedure(s) Performed: Procedure(s): COLONOSCOPY WITH PROPOFOL (N/A)  Patient Location: Endoscopy Unit  Anesthesia Type:MAC  Level of Consciousness: sedated  Airway & Oxygen Therapy: Patient Spontanous Breathing and Patient connected to face mask oxygen  Post-op Assessment: Report given to RN and Post -op Vital signs reviewed and stable  Post vital signs: Reviewed and stable  Last Vitals:  Vitals:   09/01/16 0636  BP: (!) 170/71  Pulse: 70  Resp: 18  Temp: 36.4 C    Last Pain:  Vitals:   09/01/16 0636  TempSrc: Oral         Complications: No apparent anesthesia complications

## 2016-09-01 NOTE — Discharge Instructions (Signed)

## 2016-09-01 NOTE — Anesthesia Preprocedure Evaluation (Signed)
Anesthesia Evaluation  Patient identified by MRN, date of birth, ID band Patient awake    Reviewed: Allergy & Precautions, NPO status , Patient's Chart, lab work & pertinent test results  Airway Mallampati: I  TM Distance: >3 FB Neck ROM: Full    Dental   Pulmonary former smoker,    Pulmonary exam normal        Cardiovascular hypertension, Pt. on medications Normal cardiovascular exam     Neuro/Psych Bipolar Disorder    GI/Hepatic   Endo/Other    Renal/GU      Musculoskeletal   Abdominal   Peds  Hematology   Anesthesia Other Findings   Reproductive/Obstetrics                             Anesthesia Physical Anesthesia Plan  ASA: II  Anesthesia Plan: MAC   Post-op Pain Management:    Induction: Intravenous  Airway Management Planned: Simple Face Mask  Additional Equipment:   Intra-op Plan:   Post-operative Plan:   Informed Consent: I have reviewed the patients History and Physical, chart, labs and discussed the procedure including the risks, benefits and alternatives for the proposed anesthesia with the patient or authorized representative who has indicated his/her understanding and acceptance.     Plan Discussed with: CRNA and Surgeon  Anesthesia Plan Comments:         Anesthesia Quick Evaluation

## 2016-09-01 NOTE — Anesthesia Postprocedure Evaluation (Signed)
Anesthesia Post Note  Patient: Douglas Hurst.  Procedure(s) Performed: Procedure(s) (LRB): COLONOSCOPY WITH PROPOFOL (N/A)  Patient location during evaluation: PACU Anesthesia Type: MAC Level of consciousness: awake and alert Pain management: pain level controlled Vital Signs Assessment: post-procedure vital signs reviewed and stable Respiratory status: spontaneous breathing, nonlabored ventilation, respiratory function stable and patient connected to nasal cannula oxygen Cardiovascular status: stable and blood pressure returned to baseline Anesthetic complications: no    Last Vitals:  Vitals:   09/01/16 0810 09/01/16 0820  BP: 133/75 (!) 162/78  Pulse: 75 69  Resp: 17 19  Temp:      Last Pain:  Vitals:   09/01/16 0636  TempSrc: Oral                 Caston Coopersmith DAVID

## 2016-09-01 NOTE — Op Note (Signed)
[facility] Patient Name: Douglas Hurst Procedure Date: 09/01/2016 MRN: [id number] Attending MD: Garlan Fair , MD Date of Birth: 15-Nov-1941 CSN: [id number] Age: 75 Admit Type: Outpatient Procedure:                Colonoscopy Indications:              Screening for colorectal malignant neoplasm. Father                            was diagnosed with colon cancer. Providers:                Garlan Fair, MD, Elmer Ramp. Tilden Dome, RN, Otilio Saber, Technician, Danley Danker, CRNA Referring MD:              Medicines:                Propofol per Anesthesia Complications:            No immediate complications. Estimated Blood Loss:     Estimated blood loss: none. Procedure:                Pre-Anesthesia Assessment:                           - Prior to the procedure, a History and Physical                            was performed, and patient medications and                            allergies were reviewed. The patient's tolerance of                            previous anesthesia was also reviewed. The risks                            and benefits of the procedure and the sedation                            options and risks were discussed with the patient.                            All questions were answered, and informed consent                            was obtained. Prior Anticoagulants: The patient has                            taken no previous anticoagulant or antiplatelet                            agents. ASA Grade Assessment: III - A patient with  severe systemic disease. After reviewing the risks                            and benefits, the patient was deemed in                            satisfactory condition to undergo the procedure.                           After obtaining informed consent, the colonoscope                            was passed under direct vision. Throughout the                             procedure, the patient's blood pressure, pulse, and                            oxygen saturations were monitored continuously. The                            [id number] scope was introduced through                            the anus and advanced to the the cecum, identified                            by appendiceal orifice and ileocecal valve. The                            colonoscopy was performed without difficulty. The                            patient tolerated the procedure well. The quality                            of the bowel preparation was good. The appendiceal                            orifice and the rectum were photographed. Scope In: 7:35:53 AM Scope Out: 7:55:32 AM Scope Withdrawal Time: 0 hours 14 minutes 0 seconds  Total Procedure Duration: 0 hours 19 minutes 39 seconds  Findings:      The perianal and digital rectal examinations were normal. Left colonic       diverticulosis was present.      The entire examined colon appeared normal. Impression:               - The entire examined colon is normal.                           - No specimens collected. Moderate Sedation:      N/A- Per Anesthesia Care Recommendation:           - Patient has a contact number available for  emergencies. The signs and symptoms of potential                            delayed complications were discussed with the                            patient. Return to normal activities tomorrow.                            Written discharge instructions were provided to the                            patient.                           - Repeat colonoscopy is not recommended for                            screening purposes.                           - Resume previous diet.                           - Continue present medications. Procedure Code(s):        --- Professional ---                           [id number], Colorectal cancer screening; colonoscopy on                             individual not meeting criteria for high risk Diagnosis Code(s):        --- Professional ---                           Z12.11, Encounter for screening for malignant                            neoplasm of colon CPT copyright 2016 American Medical Association. All rights reserved. The codes documented in this report are preliminary and upon coder review may  be revised to meet current compliance requirements. Earle Gell, MD Garlan Fair, MD 09/01/2016 8:03:16 AM This report has been signed electronically. Number of Addenda: 0

## 2016-09-02 ENCOUNTER — Encounter (HOSPITAL_COMMUNITY): Payer: Self-pay | Admitting: Gastroenterology

## 2016-10-23 DIAGNOSIS — Z23 Encounter for immunization: Secondary | ICD-10-CM | POA: Diagnosis not present

## 2016-12-07 DIAGNOSIS — Z961 Presence of intraocular lens: Secondary | ICD-10-CM | POA: Diagnosis not present

## 2016-12-07 DIAGNOSIS — H35373 Puckering of macula, bilateral: Secondary | ICD-10-CM | POA: Diagnosis not present

## 2016-12-07 DIAGNOSIS — H43813 Vitreous degeneration, bilateral: Secondary | ICD-10-CM | POA: Diagnosis not present

## 2016-12-31 DIAGNOSIS — E119 Type 2 diabetes mellitus without complications: Secondary | ICD-10-CM | POA: Diagnosis not present

## 2016-12-31 DIAGNOSIS — K219 Gastro-esophageal reflux disease without esophagitis: Secondary | ICD-10-CM | POA: Diagnosis not present

## 2016-12-31 DIAGNOSIS — I10 Essential (primary) hypertension: Secondary | ICD-10-CM | POA: Diagnosis not present

## 2016-12-31 DIAGNOSIS — I714 Abdominal aortic aneurysm, without rupture: Secondary | ICD-10-CM | POA: Diagnosis not present

## 2016-12-31 DIAGNOSIS — N5201 Erectile dysfunction due to arterial insufficiency: Secondary | ICD-10-CM | POA: Diagnosis not present

## 2017-06-02 DIAGNOSIS — Z0000 Encounter for general adult medical examination without abnormal findings: Secondary | ICD-10-CM | POA: Diagnosis not present

## 2017-06-02 DIAGNOSIS — J449 Chronic obstructive pulmonary disease, unspecified: Secondary | ICD-10-CM | POA: Diagnosis not present

## 2017-06-02 DIAGNOSIS — I10 Essential (primary) hypertension: Secondary | ICD-10-CM | POA: Diagnosis not present

## 2017-06-02 DIAGNOSIS — E782 Mixed hyperlipidemia: Secondary | ICD-10-CM | POA: Diagnosis not present

## 2017-06-02 DIAGNOSIS — R1314 Dysphagia, pharyngoesophageal phase: Secondary | ICD-10-CM | POA: Diagnosis not present

## 2017-06-02 DIAGNOSIS — I714 Abdominal aortic aneurysm, without rupture: Secondary | ICD-10-CM | POA: Diagnosis not present

## 2017-06-02 DIAGNOSIS — Z1389 Encounter for screening for other disorder: Secondary | ICD-10-CM | POA: Diagnosis not present

## 2017-06-02 DIAGNOSIS — F319 Bipolar disorder, unspecified: Secondary | ICD-10-CM | POA: Diagnosis not present

## 2017-06-02 DIAGNOSIS — Z23 Encounter for immunization: Secondary | ICD-10-CM | POA: Diagnosis not present

## 2017-06-02 DIAGNOSIS — Z7189 Other specified counseling: Secondary | ICD-10-CM | POA: Diagnosis not present

## 2017-06-02 DIAGNOSIS — E119 Type 2 diabetes mellitus without complications: Secondary | ICD-10-CM | POA: Diagnosis not present

## 2017-06-02 DIAGNOSIS — N5201 Erectile dysfunction due to arterial insufficiency: Secondary | ICD-10-CM | POA: Diagnosis not present

## 2017-10-20 DIAGNOSIS — Z23 Encounter for immunization: Secondary | ICD-10-CM | POA: Diagnosis not present

## 2017-10-25 DIAGNOSIS — D225 Melanocytic nevi of trunk: Secondary | ICD-10-CM | POA: Diagnosis not present

## 2017-10-25 DIAGNOSIS — L821 Other seborrheic keratosis: Secondary | ICD-10-CM | POA: Diagnosis not present

## 2017-10-25 DIAGNOSIS — D1801 Hemangioma of skin and subcutaneous tissue: Secondary | ICD-10-CM | POA: Diagnosis not present

## 2017-10-25 DIAGNOSIS — M6740 Ganglion, unspecified site: Secondary | ICD-10-CM | POA: Diagnosis not present

## 2017-10-25 DIAGNOSIS — L720 Epidermal cyst: Secondary | ICD-10-CM | POA: Diagnosis not present

## 2017-11-12 DIAGNOSIS — N400 Enlarged prostate without lower urinary tract symptoms: Secondary | ICD-10-CM | POA: Diagnosis not present

## 2017-11-12 DIAGNOSIS — E782 Mixed hyperlipidemia: Secondary | ICD-10-CM | POA: Diagnosis not present

## 2017-11-12 DIAGNOSIS — E119 Type 2 diabetes mellitus without complications: Secondary | ICD-10-CM | POA: Diagnosis not present

## 2017-11-12 DIAGNOSIS — J449 Chronic obstructive pulmonary disease, unspecified: Secondary | ICD-10-CM | POA: Diagnosis not present

## 2017-11-12 DIAGNOSIS — I10 Essential (primary) hypertension: Secondary | ICD-10-CM | POA: Diagnosis not present

## 2017-11-12 DIAGNOSIS — Z7984 Long term (current) use of oral hypoglycemic drugs: Secondary | ICD-10-CM | POA: Diagnosis not present

## 2017-12-03 DIAGNOSIS — K219 Gastro-esophageal reflux disease without esophagitis: Secondary | ICD-10-CM | POA: Diagnosis not present

## 2017-12-03 DIAGNOSIS — E119 Type 2 diabetes mellitus without complications: Secondary | ICD-10-CM | POA: Diagnosis not present

## 2017-12-03 DIAGNOSIS — I10 Essential (primary) hypertension: Secondary | ICD-10-CM | POA: Diagnosis not present

## 2017-12-03 DIAGNOSIS — J449 Chronic obstructive pulmonary disease, unspecified: Secondary | ICD-10-CM | POA: Diagnosis not present

## 2018-02-07 DIAGNOSIS — H35373 Puckering of macula, bilateral: Secondary | ICD-10-CM | POA: Diagnosis not present

## 2018-02-07 DIAGNOSIS — Z961 Presence of intraocular lens: Secondary | ICD-10-CM | POA: Diagnosis not present

## 2018-02-07 DIAGNOSIS — H43813 Vitreous degeneration, bilateral: Secondary | ICD-10-CM | POA: Diagnosis not present

## 2018-03-22 DIAGNOSIS — J449 Chronic obstructive pulmonary disease, unspecified: Secondary | ICD-10-CM | POA: Diagnosis not present

## 2018-03-22 DIAGNOSIS — Z7984 Long term (current) use of oral hypoglycemic drugs: Secondary | ICD-10-CM | POA: Diagnosis not present

## 2018-03-22 DIAGNOSIS — I10 Essential (primary) hypertension: Secondary | ICD-10-CM | POA: Diagnosis not present

## 2018-03-22 DIAGNOSIS — E782 Mixed hyperlipidemia: Secondary | ICD-10-CM | POA: Diagnosis not present

## 2018-03-22 DIAGNOSIS — E119 Type 2 diabetes mellitus without complications: Secondary | ICD-10-CM | POA: Diagnosis not present

## 2018-03-22 DIAGNOSIS — N400 Enlarged prostate without lower urinary tract symptoms: Secondary | ICD-10-CM | POA: Diagnosis not present

## 2018-06-09 ENCOUNTER — Other Ambulatory Visit: Payer: Self-pay | Admitting: Internal Medicine

## 2018-06-09 DIAGNOSIS — I739 Peripheral vascular disease, unspecified: Secondary | ICD-10-CM | POA: Diagnosis not present

## 2018-06-09 DIAGNOSIS — R1310 Dysphagia, unspecified: Secondary | ICD-10-CM

## 2018-06-09 DIAGNOSIS — I10 Essential (primary) hypertension: Secondary | ICD-10-CM | POA: Diagnosis not present

## 2018-06-09 DIAGNOSIS — E1165 Type 2 diabetes mellitus with hyperglycemia: Secondary | ICD-10-CM | POA: Diagnosis not present

## 2018-06-09 DIAGNOSIS — E1140 Type 2 diabetes mellitus with diabetic neuropathy, unspecified: Secondary | ICD-10-CM | POA: Diagnosis not present

## 2018-06-09 DIAGNOSIS — I714 Abdominal aortic aneurysm, without rupture, unspecified: Secondary | ICD-10-CM

## 2018-06-09 DIAGNOSIS — Z1389 Encounter for screening for other disorder: Secondary | ICD-10-CM | POA: Diagnosis not present

## 2018-06-09 DIAGNOSIS — E782 Mixed hyperlipidemia: Secondary | ICD-10-CM | POA: Diagnosis not present

## 2018-06-09 DIAGNOSIS — E1151 Type 2 diabetes mellitus with diabetic peripheral angiopathy without gangrene: Secondary | ICD-10-CM | POA: Diagnosis not present

## 2018-06-09 DIAGNOSIS — R1314 Dysphagia, pharyngoesophageal phase: Secondary | ICD-10-CM | POA: Diagnosis not present

## 2018-06-09 DIAGNOSIS — Z0000 Encounter for general adult medical examination without abnormal findings: Secondary | ICD-10-CM | POA: Diagnosis not present

## 2018-06-09 DIAGNOSIS — N5201 Erectile dysfunction due to arterial insufficiency: Secondary | ICD-10-CM | POA: Diagnosis not present

## 2018-06-09 DIAGNOSIS — Z7984 Long term (current) use of oral hypoglycemic drugs: Secondary | ICD-10-CM | POA: Diagnosis not present

## 2018-06-09 DIAGNOSIS — Z7189 Other specified counseling: Secondary | ICD-10-CM | POA: Diagnosis not present

## 2018-06-09 DIAGNOSIS — G629 Polyneuropathy, unspecified: Secondary | ICD-10-CM | POA: Diagnosis not present

## 2018-06-20 ENCOUNTER — Ambulatory Visit
Admission: RE | Admit: 2018-06-20 | Discharge: 2018-06-20 | Disposition: A | Discharge status: Home / Self Care | Payer: Medicare Other | Source: Ambulatory Visit | Attending: Internal Medicine | Admitting: Internal Medicine

## 2018-06-20 DIAGNOSIS — I714 Abdominal aortic aneurysm, without rupture, unspecified: Secondary | ICD-10-CM

## 2018-06-20 DIAGNOSIS — R1310 Dysphagia, unspecified: Secondary | ICD-10-CM | POA: Diagnosis not present

## 2018-08-01 DIAGNOSIS — L02412 Cutaneous abscess of left axilla: Secondary | ICD-10-CM | POA: Diagnosis not present

## 2018-08-04 DIAGNOSIS — Z1211 Encounter for screening for malignant neoplasm of colon: Secondary | ICD-10-CM | POA: Diagnosis not present

## 2018-08-04 DIAGNOSIS — R1310 Dysphagia, unspecified: Secondary | ICD-10-CM | POA: Diagnosis not present

## 2018-08-05 DIAGNOSIS — I96 Gangrene, not elsewhere classified: Secondary | ICD-10-CM | POA: Diagnosis not present

## 2018-08-05 DIAGNOSIS — L02412 Cutaneous abscess of left axilla: Secondary | ICD-10-CM | POA: Diagnosis not present

## 2018-08-05 DIAGNOSIS — R2232 Localized swelling, mass and lump, left upper limb: Secondary | ICD-10-CM | POA: Diagnosis not present

## 2018-08-13 DIAGNOSIS — Z1211 Encounter for screening for malignant neoplasm of colon: Secondary | ICD-10-CM | POA: Diagnosis not present

## 2018-08-13 DIAGNOSIS — Z1212 Encounter for screening for malignant neoplasm of rectum: Secondary | ICD-10-CM | POA: Diagnosis not present

## 2018-08-19 DIAGNOSIS — N400 Enlarged prostate without lower urinary tract symptoms: Secondary | ICD-10-CM | POA: Diagnosis not present

## 2018-08-19 DIAGNOSIS — J449 Chronic obstructive pulmonary disease, unspecified: Secondary | ICD-10-CM | POA: Diagnosis not present

## 2018-08-19 DIAGNOSIS — I10 Essential (primary) hypertension: Secondary | ICD-10-CM | POA: Diagnosis not present

## 2018-08-19 DIAGNOSIS — E1140 Type 2 diabetes mellitus with diabetic neuropathy, unspecified: Secondary | ICD-10-CM | POA: Diagnosis not present

## 2018-08-19 DIAGNOSIS — E1151 Type 2 diabetes mellitus with diabetic peripheral angiopathy without gangrene: Secondary | ICD-10-CM | POA: Diagnosis not present

## 2018-08-19 DIAGNOSIS — Z7984 Long term (current) use of oral hypoglycemic drugs: Secondary | ICD-10-CM | POA: Diagnosis not present

## 2018-08-19 DIAGNOSIS — E119 Type 2 diabetes mellitus without complications: Secondary | ICD-10-CM | POA: Diagnosis not present

## 2018-08-19 DIAGNOSIS — E782 Mixed hyperlipidemia: Secondary | ICD-10-CM | POA: Diagnosis not present

## 2018-10-17 DIAGNOSIS — Z23 Encounter for immunization: Secondary | ICD-10-CM | POA: Diagnosis not present

## 2018-10-18 DIAGNOSIS — E782 Mixed hyperlipidemia: Secondary | ICD-10-CM | POA: Diagnosis not present

## 2018-10-18 DIAGNOSIS — E1151 Type 2 diabetes mellitus with diabetic peripheral angiopathy without gangrene: Secondary | ICD-10-CM | POA: Diagnosis not present

## 2018-10-18 DIAGNOSIS — I10 Essential (primary) hypertension: Secondary | ICD-10-CM | POA: Diagnosis not present

## 2018-10-18 DIAGNOSIS — N400 Enlarged prostate without lower urinary tract symptoms: Secondary | ICD-10-CM | POA: Diagnosis not present

## 2018-10-18 DIAGNOSIS — J449 Chronic obstructive pulmonary disease, unspecified: Secondary | ICD-10-CM | POA: Diagnosis not present

## 2018-10-18 DIAGNOSIS — E1140 Type 2 diabetes mellitus with diabetic neuropathy, unspecified: Secondary | ICD-10-CM | POA: Diagnosis not present

## 2018-10-25 DIAGNOSIS — I10 Essential (primary) hypertension: Secondary | ICD-10-CM | POA: Diagnosis not present

## 2018-10-25 DIAGNOSIS — G629 Polyneuropathy, unspecified: Secondary | ICD-10-CM | POA: Diagnosis not present

## 2018-10-25 DIAGNOSIS — N5201 Erectile dysfunction due to arterial insufficiency: Secondary | ICD-10-CM | POA: Diagnosis not present

## 2018-10-25 DIAGNOSIS — E1151 Type 2 diabetes mellitus with diabetic peripheral angiopathy without gangrene: Secondary | ICD-10-CM | POA: Diagnosis not present

## 2018-10-25 DIAGNOSIS — I739 Peripheral vascular disease, unspecified: Secondary | ICD-10-CM | POA: Diagnosis not present

## 2018-10-25 DIAGNOSIS — E1140 Type 2 diabetes mellitus with diabetic neuropathy, unspecified: Secondary | ICD-10-CM | POA: Diagnosis not present

## 2018-10-25 DIAGNOSIS — Z23 Encounter for immunization: Secondary | ICD-10-CM | POA: Diagnosis not present

## 2018-10-25 DIAGNOSIS — K219 Gastro-esophageal reflux disease without esophagitis: Secondary | ICD-10-CM | POA: Diagnosis not present

## 2018-10-25 DIAGNOSIS — J449 Chronic obstructive pulmonary disease, unspecified: Secondary | ICD-10-CM | POA: Diagnosis not present

## 2018-11-28 DIAGNOSIS — L570 Actinic keratosis: Secondary | ICD-10-CM | POA: Diagnosis not present

## 2018-11-28 DIAGNOSIS — L821 Other seborrheic keratosis: Secondary | ICD-10-CM | POA: Diagnosis not present

## 2019-01-26 DIAGNOSIS — R1310 Dysphagia, unspecified: Secondary | ICD-10-CM | POA: Diagnosis not present

## 2019-01-26 DIAGNOSIS — K224 Dyskinesia of esophagus: Secondary | ICD-10-CM | POA: Diagnosis not present

## 2019-01-26 DIAGNOSIS — K219 Gastro-esophageal reflux disease without esophagitis: Secondary | ICD-10-CM | POA: Diagnosis not present

## 2019-02-28 DIAGNOSIS — I739 Peripheral vascular disease, unspecified: Secondary | ICD-10-CM | POA: Diagnosis not present

## 2019-02-28 DIAGNOSIS — K219 Gastro-esophageal reflux disease without esophagitis: Secondary | ICD-10-CM | POA: Diagnosis not present

## 2019-02-28 DIAGNOSIS — E1140 Type 2 diabetes mellitus with diabetic neuropathy, unspecified: Secondary | ICD-10-CM | POA: Diagnosis not present

## 2019-02-28 DIAGNOSIS — I10 Essential (primary) hypertension: Secondary | ICD-10-CM | POA: Diagnosis not present

## 2019-02-28 DIAGNOSIS — J449 Chronic obstructive pulmonary disease, unspecified: Secondary | ICD-10-CM | POA: Diagnosis not present

## 2019-02-28 DIAGNOSIS — E1151 Type 2 diabetes mellitus with diabetic peripheral angiopathy without gangrene: Secondary | ICD-10-CM | POA: Diagnosis not present

## 2019-02-28 DIAGNOSIS — G629 Polyneuropathy, unspecified: Secondary | ICD-10-CM | POA: Diagnosis not present

## 2019-02-28 DIAGNOSIS — K224 Dyskinesia of esophagus: Secondary | ICD-10-CM | POA: Diagnosis not present

## 2019-02-28 DIAGNOSIS — R1310 Dysphagia, unspecified: Secondary | ICD-10-CM | POA: Diagnosis not present

## 2019-03-14 DIAGNOSIS — K449 Diaphragmatic hernia without obstruction or gangrene: Secondary | ICD-10-CM | POA: Diagnosis not present

## 2019-03-14 DIAGNOSIS — K222 Esophageal obstruction: Secondary | ICD-10-CM | POA: Diagnosis not present

## 2019-03-14 DIAGNOSIS — R1314 Dysphagia, pharyngoesophageal phase: Secondary | ICD-10-CM | POA: Diagnosis not present

## 2019-04-11 DIAGNOSIS — E1151 Type 2 diabetes mellitus with diabetic peripheral angiopathy without gangrene: Secondary | ICD-10-CM | POA: Diagnosis not present

## 2019-04-11 DIAGNOSIS — E1140 Type 2 diabetes mellitus with diabetic neuropathy, unspecified: Secondary | ICD-10-CM | POA: Diagnosis not present

## 2019-04-11 DIAGNOSIS — E782 Mixed hyperlipidemia: Secondary | ICD-10-CM | POA: Diagnosis not present

## 2019-04-11 DIAGNOSIS — J449 Chronic obstructive pulmonary disease, unspecified: Secondary | ICD-10-CM | POA: Diagnosis not present

## 2019-04-11 DIAGNOSIS — I10 Essential (primary) hypertension: Secondary | ICD-10-CM | POA: Diagnosis not present

## 2019-04-11 DIAGNOSIS — N400 Enlarged prostate without lower urinary tract symptoms: Secondary | ICD-10-CM | POA: Diagnosis not present

## 2019-04-11 DIAGNOSIS — Z7984 Long term (current) use of oral hypoglycemic drugs: Secondary | ICD-10-CM | POA: Diagnosis not present

## 2019-06-06 DIAGNOSIS — R1310 Dysphagia, unspecified: Secondary | ICD-10-CM | POA: Diagnosis not present

## 2019-06-20 DIAGNOSIS — Z0000 Encounter for general adult medical examination without abnormal findings: Secondary | ICD-10-CM | POA: Diagnosis not present

## 2019-06-20 DIAGNOSIS — Z1389 Encounter for screening for other disorder: Secondary | ICD-10-CM | POA: Diagnosis not present

## 2019-07-03 DIAGNOSIS — N5201 Erectile dysfunction due to arterial insufficiency: Secondary | ICD-10-CM | POA: Diagnosis not present

## 2019-07-03 DIAGNOSIS — E1140 Type 2 diabetes mellitus with diabetic neuropathy, unspecified: Secondary | ICD-10-CM | POA: Diagnosis not present

## 2019-07-03 DIAGNOSIS — E1151 Type 2 diabetes mellitus with diabetic peripheral angiopathy without gangrene: Secondary | ICD-10-CM | POA: Diagnosis not present

## 2019-07-03 DIAGNOSIS — I739 Peripheral vascular disease, unspecified: Secondary | ICD-10-CM | POA: Diagnosis not present

## 2019-07-03 DIAGNOSIS — E7800 Pure hypercholesterolemia, unspecified: Secondary | ICD-10-CM | POA: Diagnosis not present

## 2019-07-03 DIAGNOSIS — G629 Polyneuropathy, unspecified: Secondary | ICD-10-CM | POA: Diagnosis not present

## 2019-07-03 DIAGNOSIS — I10 Essential (primary) hypertension: Secondary | ICD-10-CM | POA: Diagnosis not present

## 2019-07-03 DIAGNOSIS — H9193 Unspecified hearing loss, bilateral: Secondary | ICD-10-CM | POA: Diagnosis not present

## 2019-08-25 DIAGNOSIS — N400 Enlarged prostate without lower urinary tract symptoms: Secondary | ICD-10-CM | POA: Diagnosis not present

## 2019-08-25 DIAGNOSIS — J449 Chronic obstructive pulmonary disease, unspecified: Secondary | ICD-10-CM | POA: Diagnosis not present

## 2019-08-25 DIAGNOSIS — E1140 Type 2 diabetes mellitus with diabetic neuropathy, unspecified: Secondary | ICD-10-CM | POA: Diagnosis not present

## 2019-08-25 DIAGNOSIS — E1151 Type 2 diabetes mellitus with diabetic peripheral angiopathy without gangrene: Secondary | ICD-10-CM | POA: Diagnosis not present

## 2019-08-25 DIAGNOSIS — I10 Essential (primary) hypertension: Secondary | ICD-10-CM | POA: Diagnosis not present

## 2019-08-25 DIAGNOSIS — E782 Mixed hyperlipidemia: Secondary | ICD-10-CM | POA: Diagnosis not present

## 2019-08-31 DIAGNOSIS — I10 Essential (primary) hypertension: Secondary | ICD-10-CM | POA: Diagnosis not present

## 2019-09-15 DIAGNOSIS — I10 Essential (primary) hypertension: Secondary | ICD-10-CM | POA: Diagnosis not present

## 2019-09-15 DIAGNOSIS — E1140 Type 2 diabetes mellitus with diabetic neuropathy, unspecified: Secondary | ICD-10-CM | POA: Diagnosis not present

## 2019-09-15 DIAGNOSIS — N400 Enlarged prostate without lower urinary tract symptoms: Secondary | ICD-10-CM | POA: Diagnosis not present

## 2019-09-15 DIAGNOSIS — E782 Mixed hyperlipidemia: Secondary | ICD-10-CM | POA: Diagnosis not present

## 2019-09-15 DIAGNOSIS — E1151 Type 2 diabetes mellitus with diabetic peripheral angiopathy without gangrene: Secondary | ICD-10-CM | POA: Diagnosis not present

## 2019-09-15 DIAGNOSIS — Z7984 Long term (current) use of oral hypoglycemic drugs: Secondary | ICD-10-CM | POA: Diagnosis not present

## 2019-09-15 DIAGNOSIS — J449 Chronic obstructive pulmonary disease, unspecified: Secondary | ICD-10-CM | POA: Diagnosis not present

## 2019-09-27 DIAGNOSIS — Z23 Encounter for immunization: Secondary | ICD-10-CM | POA: Diagnosis not present

## 2019-10-24 DIAGNOSIS — H3581 Retinal edema: Secondary | ICD-10-CM | POA: Diagnosis not present

## 2019-10-24 DIAGNOSIS — H35373 Puckering of macula, bilateral: Secondary | ICD-10-CM | POA: Diagnosis not present

## 2019-10-24 DIAGNOSIS — H43393 Other vitreous opacities, bilateral: Secondary | ICD-10-CM | POA: Diagnosis not present

## 2019-10-24 DIAGNOSIS — E113211 Type 2 diabetes mellitus with mild nonproliferative diabetic retinopathy with macular edema, right eye: Secondary | ICD-10-CM | POA: Diagnosis not present

## 2019-11-02 DIAGNOSIS — H35372 Puckering of macula, left eye: Secondary | ICD-10-CM | POA: Diagnosis not present

## 2019-11-02 DIAGNOSIS — E113211 Type 2 diabetes mellitus with mild nonproliferative diabetic retinopathy with macular edema, right eye: Secondary | ICD-10-CM | POA: Diagnosis not present

## 2019-11-02 DIAGNOSIS — H35371 Puckering of macula, right eye: Secondary | ICD-10-CM | POA: Diagnosis not present

## 2019-11-10 DIAGNOSIS — H35372 Puckering of macula, left eye: Secondary | ICD-10-CM | POA: Diagnosis not present

## 2019-11-10 DIAGNOSIS — E113211 Type 2 diabetes mellitus with mild nonproliferative diabetic retinopathy with macular edema, right eye: Secondary | ICD-10-CM | POA: Diagnosis not present

## 2019-11-14 DIAGNOSIS — J449 Chronic obstructive pulmonary disease, unspecified: Secondary | ICD-10-CM | POA: Diagnosis not present

## 2019-11-14 DIAGNOSIS — K224 Dyskinesia of esophagus: Secondary | ICD-10-CM | POA: Diagnosis not present

## 2019-11-14 DIAGNOSIS — E1151 Type 2 diabetes mellitus with diabetic peripheral angiopathy without gangrene: Secondary | ICD-10-CM | POA: Diagnosis not present

## 2019-11-14 DIAGNOSIS — I739 Peripheral vascular disease, unspecified: Secondary | ICD-10-CM | POA: Diagnosis not present

## 2019-11-14 DIAGNOSIS — I10 Essential (primary) hypertension: Secondary | ICD-10-CM | POA: Diagnosis not present

## 2019-11-14 DIAGNOSIS — R1310 Dysphagia, unspecified: Secondary | ICD-10-CM | POA: Diagnosis not present

## 2019-11-14 DIAGNOSIS — E1140 Type 2 diabetes mellitus with diabetic neuropathy, unspecified: Secondary | ICD-10-CM | POA: Diagnosis not present

## 2019-11-27 DIAGNOSIS — J449 Chronic obstructive pulmonary disease, unspecified: Secondary | ICD-10-CM | POA: Diagnosis not present

## 2019-11-27 DIAGNOSIS — E7800 Pure hypercholesterolemia, unspecified: Secondary | ICD-10-CM | POA: Diagnosis not present

## 2019-11-27 DIAGNOSIS — E1140 Type 2 diabetes mellitus with diabetic neuropathy, unspecified: Secondary | ICD-10-CM | POA: Diagnosis not present

## 2019-11-27 DIAGNOSIS — N400 Enlarged prostate without lower urinary tract symptoms: Secondary | ICD-10-CM | POA: Diagnosis not present

## 2019-11-27 DIAGNOSIS — I10 Essential (primary) hypertension: Secondary | ICD-10-CM | POA: Diagnosis not present

## 2019-11-27 DIAGNOSIS — E782 Mixed hyperlipidemia: Secondary | ICD-10-CM | POA: Diagnosis not present

## 2019-11-27 DIAGNOSIS — E1151 Type 2 diabetes mellitus with diabetic peripheral angiopathy without gangrene: Secondary | ICD-10-CM | POA: Diagnosis not present

## 2019-12-01 DIAGNOSIS — H3581 Retinal edema: Secondary | ICD-10-CM | POA: Diagnosis not present

## 2019-12-01 DIAGNOSIS — H35373 Puckering of macula, bilateral: Secondary | ICD-10-CM | POA: Diagnosis not present

## 2020-01-17 ENCOUNTER — Ambulatory Visit: Payer: Medicare HMO | Attending: Internal Medicine

## 2020-01-17 DIAGNOSIS — Z23 Encounter for immunization: Secondary | ICD-10-CM | POA: Insufficient documentation

## 2020-01-17 NOTE — Progress Notes (Signed)
   [id number]  Name:  Douglas Hurst.    MRN: [id number] DOB: 02/20/1941  01/17/2020  [name] was observed post Covid-19 immunization for 15 minutes without incidence. He was provided with Vaccine Information Sheet and instruction to access the V-Safe system.   [name] was instructed to call 911 with any severe reactions post vaccine: Marland Kitchen Difficulty breathing  . Swelling of your face and throat  . A fast heartbeat  . A bad rash all over your body  . Dizziness and weakness    Immunizations Administered    Name Date Dose VIS Date Route   Pfizer COVID-19 Vaccine 01/17/2020  1:26 PM 0.3 mL 12/08/2019 Intramuscular   Manufacturer: [place]   Lot: [id number]   [place]: [id number]

## 2020-01-26 DIAGNOSIS — I10 Essential (primary) hypertension: Secondary | ICD-10-CM | POA: Diagnosis not present

## 2020-01-26 DIAGNOSIS — E782 Mixed hyperlipidemia: Secondary | ICD-10-CM | POA: Diagnosis not present

## 2020-01-26 DIAGNOSIS — N400 Enlarged prostate without lower urinary tract symptoms: Secondary | ICD-10-CM | POA: Diagnosis not present

## 2020-01-26 DIAGNOSIS — E1151 Type 2 diabetes mellitus with diabetic peripheral angiopathy without gangrene: Secondary | ICD-10-CM | POA: Diagnosis not present

## 2020-01-26 DIAGNOSIS — E1140 Type 2 diabetes mellitus with diabetic neuropathy, unspecified: Secondary | ICD-10-CM | POA: Diagnosis not present

## 2020-01-26 DIAGNOSIS — J449 Chronic obstructive pulmonary disease, unspecified: Secondary | ICD-10-CM | POA: Diagnosis not present

## 2020-01-26 DIAGNOSIS — E7800 Pure hypercholesterolemia, unspecified: Secondary | ICD-10-CM | POA: Diagnosis not present

## 2020-01-31 DIAGNOSIS — H43813 Vitreous degeneration, bilateral: Secondary | ICD-10-CM | POA: Diagnosis not present

## 2020-01-31 DIAGNOSIS — Z9889 Other specified postprocedural states: Secondary | ICD-10-CM | POA: Diagnosis not present

## 2020-01-31 DIAGNOSIS — E119 Type 2 diabetes mellitus without complications: Secondary | ICD-10-CM | POA: Diagnosis not present

## 2020-01-31 DIAGNOSIS — Z0100 Encounter for examination of eyes and vision without abnormal findings: Secondary | ICD-10-CM | POA: Diagnosis not present

## 2020-01-31 DIAGNOSIS — Z961 Presence of intraocular lens: Secondary | ICD-10-CM | POA: Diagnosis not present

## 2020-02-07 ENCOUNTER — Ambulatory Visit: Payer: Medicare HMO | Attending: Internal Medicine

## 2020-02-07 DIAGNOSIS — Z23 Encounter for immunization: Secondary | ICD-10-CM

## 2020-02-12 DIAGNOSIS — H9193 Unspecified hearing loss, bilateral: Secondary | ICD-10-CM | POA: Diagnosis not present

## 2020-02-12 DIAGNOSIS — I10 Essential (primary) hypertension: Secondary | ICD-10-CM | POA: Diagnosis not present

## 2020-02-12 DIAGNOSIS — R413 Other amnesia: Secondary | ICD-10-CM | POA: Diagnosis not present

## 2020-02-12 DIAGNOSIS — K219 Gastro-esophageal reflux disease without esophagitis: Secondary | ICD-10-CM | POA: Diagnosis not present

## 2020-02-12 DIAGNOSIS — E1151 Type 2 diabetes mellitus with diabetic peripheral angiopathy without gangrene: Secondary | ICD-10-CM | POA: Diagnosis not present

## 2020-02-23 DIAGNOSIS — E1151 Type 2 diabetes mellitus with diabetic peripheral angiopathy without gangrene: Secondary | ICD-10-CM | POA: Diagnosis not present

## 2020-02-23 DIAGNOSIS — E1140 Type 2 diabetes mellitus with diabetic neuropathy, unspecified: Secondary | ICD-10-CM | POA: Diagnosis not present

## 2020-02-23 DIAGNOSIS — N400 Enlarged prostate without lower urinary tract symptoms: Secondary | ICD-10-CM | POA: Diagnosis not present

## 2020-02-23 DIAGNOSIS — E7800 Pure hypercholesterolemia, unspecified: Secondary | ICD-10-CM | POA: Diagnosis not present

## 2020-02-23 DIAGNOSIS — E782 Mixed hyperlipidemia: Secondary | ICD-10-CM | POA: Diagnosis not present

## 2020-02-23 DIAGNOSIS — I10 Essential (primary) hypertension: Secondary | ICD-10-CM | POA: Diagnosis not present

## 2020-02-23 DIAGNOSIS — J449 Chronic obstructive pulmonary disease, unspecified: Secondary | ICD-10-CM | POA: Diagnosis not present

## 2020-04-24 DIAGNOSIS — E7800 Pure hypercholesterolemia, unspecified: Secondary | ICD-10-CM | POA: Diagnosis not present

## 2020-04-24 DIAGNOSIS — F319 Bipolar disorder, unspecified: Secondary | ICD-10-CM | POA: Diagnosis not present

## 2020-04-24 DIAGNOSIS — J449 Chronic obstructive pulmonary disease, unspecified: Secondary | ICD-10-CM | POA: Diagnosis not present

## 2020-04-24 DIAGNOSIS — I10 Essential (primary) hypertension: Secondary | ICD-10-CM | POA: Diagnosis not present

## 2020-04-24 DIAGNOSIS — N400 Enlarged prostate without lower urinary tract symptoms: Secondary | ICD-10-CM | POA: Diagnosis not present

## 2020-04-24 DIAGNOSIS — E782 Mixed hyperlipidemia: Secondary | ICD-10-CM | POA: Diagnosis not present

## 2020-04-24 DIAGNOSIS — E1151 Type 2 diabetes mellitus with diabetic peripheral angiopathy without gangrene: Secondary | ICD-10-CM | POA: Diagnosis not present

## 2020-04-24 DIAGNOSIS — E1140 Type 2 diabetes mellitus with diabetic neuropathy, unspecified: Secondary | ICD-10-CM | POA: Diagnosis not present

## 2020-05-20 DIAGNOSIS — E1151 Type 2 diabetes mellitus with diabetic peripheral angiopathy without gangrene: Secondary | ICD-10-CM | POA: Diagnosis not present

## 2020-05-20 DIAGNOSIS — F319 Bipolar disorder, unspecified: Secondary | ICD-10-CM | POA: Diagnosis not present

## 2020-05-20 DIAGNOSIS — E782 Mixed hyperlipidemia: Secondary | ICD-10-CM | POA: Diagnosis not present

## 2020-05-20 DIAGNOSIS — E7800 Pure hypercholesterolemia, unspecified: Secondary | ICD-10-CM | POA: Diagnosis not present

## 2020-05-20 DIAGNOSIS — I10 Essential (primary) hypertension: Secondary | ICD-10-CM | POA: Diagnosis not present

## 2020-05-20 DIAGNOSIS — N400 Enlarged prostate without lower urinary tract symptoms: Secondary | ICD-10-CM | POA: Diagnosis not present

## 2020-05-20 DIAGNOSIS — J449 Chronic obstructive pulmonary disease, unspecified: Secondary | ICD-10-CM | POA: Diagnosis not present

## 2020-05-20 DIAGNOSIS — E1140 Type 2 diabetes mellitus with diabetic neuropathy, unspecified: Secondary | ICD-10-CM | POA: Diagnosis not present

## 2020-06-04 DIAGNOSIS — N400 Enlarged prostate without lower urinary tract symptoms: Secondary | ICD-10-CM | POA: Diagnosis not present

## 2020-06-04 DIAGNOSIS — E1140 Type 2 diabetes mellitus with diabetic neuropathy, unspecified: Secondary | ICD-10-CM | POA: Diagnosis not present

## 2020-06-04 DIAGNOSIS — K219 Gastro-esophageal reflux disease without esophagitis: Secondary | ICD-10-CM | POA: Diagnosis not present

## 2020-06-04 DIAGNOSIS — E538 Deficiency of other specified B group vitamins: Secondary | ICD-10-CM | POA: Diagnosis not present

## 2020-06-04 DIAGNOSIS — Z1389 Encounter for screening for other disorder: Secondary | ICD-10-CM | POA: Diagnosis not present

## 2020-06-04 DIAGNOSIS — E7800 Pure hypercholesterolemia, unspecified: Secondary | ICD-10-CM | POA: Diagnosis not present

## 2020-06-04 DIAGNOSIS — E1151 Type 2 diabetes mellitus with diabetic peripheral angiopathy without gangrene: Secondary | ICD-10-CM | POA: Diagnosis not present

## 2020-06-04 DIAGNOSIS — J449 Chronic obstructive pulmonary disease, unspecified: Secondary | ICD-10-CM | POA: Diagnosis not present

## 2020-06-04 DIAGNOSIS — I739 Peripheral vascular disease, unspecified: Secondary | ICD-10-CM | POA: Diagnosis not present

## 2020-06-04 DIAGNOSIS — I10 Essential (primary) hypertension: Secondary | ICD-10-CM | POA: Diagnosis not present

## 2020-06-04 DIAGNOSIS — Z0000 Encounter for general adult medical examination without abnormal findings: Secondary | ICD-10-CM | POA: Diagnosis not present

## 2020-06-04 DIAGNOSIS — I714 Abdominal aortic aneurysm, without rupture: Secondary | ICD-10-CM | POA: Diagnosis not present

## 2020-06-07 DIAGNOSIS — I714 Abdominal aortic aneurysm, without rupture: Secondary | ICD-10-CM | POA: Diagnosis not present

## 2020-06-25 DIAGNOSIS — N400 Enlarged prostate without lower urinary tract symptoms: Secondary | ICD-10-CM | POA: Diagnosis not present

## 2020-06-25 DIAGNOSIS — J449 Chronic obstructive pulmonary disease, unspecified: Secondary | ICD-10-CM | POA: Diagnosis not present

## 2020-06-25 DIAGNOSIS — F319 Bipolar disorder, unspecified: Secondary | ICD-10-CM | POA: Diagnosis not present

## 2020-06-25 DIAGNOSIS — E7800 Pure hypercholesterolemia, unspecified: Secondary | ICD-10-CM | POA: Diagnosis not present

## 2020-06-25 DIAGNOSIS — E1151 Type 2 diabetes mellitus with diabetic peripheral angiopathy without gangrene: Secondary | ICD-10-CM | POA: Diagnosis not present

## 2020-06-25 DIAGNOSIS — I10 Essential (primary) hypertension: Secondary | ICD-10-CM | POA: Diagnosis not present

## 2020-06-25 DIAGNOSIS — E782 Mixed hyperlipidemia: Secondary | ICD-10-CM | POA: Diagnosis not present

## 2020-06-25 DIAGNOSIS — E1140 Type 2 diabetes mellitus with diabetic neuropathy, unspecified: Secondary | ICD-10-CM | POA: Diagnosis not present

## 2020-08-08 DIAGNOSIS — Z7984 Long term (current) use of oral hypoglycemic drugs: Secondary | ICD-10-CM | POA: Diagnosis not present

## 2020-08-08 DIAGNOSIS — J449 Chronic obstructive pulmonary disease, unspecified: Secondary | ICD-10-CM | POA: Diagnosis not present

## 2020-08-08 DIAGNOSIS — F319 Bipolar disorder, unspecified: Secondary | ICD-10-CM | POA: Diagnosis not present

## 2020-08-08 DIAGNOSIS — N400 Enlarged prostate without lower urinary tract symptoms: Secondary | ICD-10-CM | POA: Diagnosis not present

## 2020-08-08 DIAGNOSIS — E782 Mixed hyperlipidemia: Secondary | ICD-10-CM | POA: Diagnosis not present

## 2020-08-08 DIAGNOSIS — E1140 Type 2 diabetes mellitus with diabetic neuropathy, unspecified: Secondary | ICD-10-CM | POA: Diagnosis not present

## 2020-08-08 DIAGNOSIS — I10 Essential (primary) hypertension: Secondary | ICD-10-CM | POA: Diagnosis not present

## 2020-08-08 DIAGNOSIS — E7800 Pure hypercholesterolemia, unspecified: Secondary | ICD-10-CM | POA: Diagnosis not present

## 2020-08-08 DIAGNOSIS — E1151 Type 2 diabetes mellitus with diabetic peripheral angiopathy without gangrene: Secondary | ICD-10-CM | POA: Diagnosis not present

## 2020-10-11 DIAGNOSIS — J449 Chronic obstructive pulmonary disease, unspecified: Secondary | ICD-10-CM | POA: Diagnosis not present

## 2020-10-11 DIAGNOSIS — I739 Peripheral vascular disease, unspecified: Secondary | ICD-10-CM | POA: Diagnosis not present

## 2020-10-11 DIAGNOSIS — K219 Gastro-esophageal reflux disease without esophagitis: Secondary | ICD-10-CM | POA: Diagnosis not present

## 2020-10-11 DIAGNOSIS — I10 Essential (primary) hypertension: Secondary | ICD-10-CM | POA: Diagnosis not present

## 2020-10-11 DIAGNOSIS — E1151 Type 2 diabetes mellitus with diabetic peripheral angiopathy without gangrene: Secondary | ICD-10-CM | POA: Diagnosis not present

## 2020-10-11 DIAGNOSIS — Z23 Encounter for immunization: Secondary | ICD-10-CM | POA: Diagnosis not present

## 2020-11-26 DIAGNOSIS — E1140 Type 2 diabetes mellitus with diabetic neuropathy, unspecified: Secondary | ICD-10-CM | POA: Diagnosis not present

## 2020-11-26 DIAGNOSIS — I10 Essential (primary) hypertension: Secondary | ICD-10-CM | POA: Diagnosis not present

## 2020-11-26 DIAGNOSIS — J449 Chronic obstructive pulmonary disease, unspecified: Secondary | ICD-10-CM | POA: Diagnosis not present

## 2020-11-26 DIAGNOSIS — N400 Enlarged prostate without lower urinary tract symptoms: Secondary | ICD-10-CM | POA: Diagnosis not present

## 2020-11-26 DIAGNOSIS — E7800 Pure hypercholesterolemia, unspecified: Secondary | ICD-10-CM | POA: Diagnosis not present

## 2020-11-26 DIAGNOSIS — E1151 Type 2 diabetes mellitus with diabetic peripheral angiopathy without gangrene: Secondary | ICD-10-CM | POA: Diagnosis not present

## 2020-11-26 DIAGNOSIS — E782 Mixed hyperlipidemia: Secondary | ICD-10-CM | POA: Diagnosis not present

## 2020-11-26 DIAGNOSIS — K219 Gastro-esophageal reflux disease without esophagitis: Secondary | ICD-10-CM | POA: Diagnosis not present

## 2020-11-26 DIAGNOSIS — F319 Bipolar disorder, unspecified: Secondary | ICD-10-CM | POA: Diagnosis not present

## 2021-01-14 DIAGNOSIS — K219 Gastro-esophageal reflux disease without esophagitis: Secondary | ICD-10-CM | POA: Diagnosis not present

## 2021-01-14 DIAGNOSIS — F319 Bipolar disorder, unspecified: Secondary | ICD-10-CM | POA: Diagnosis not present

## 2021-01-14 DIAGNOSIS — J449 Chronic obstructive pulmonary disease, unspecified: Secondary | ICD-10-CM | POA: Diagnosis not present

## 2021-01-14 DIAGNOSIS — N400 Enlarged prostate without lower urinary tract symptoms: Secondary | ICD-10-CM | POA: Diagnosis not present

## 2021-01-14 DIAGNOSIS — I10 Essential (primary) hypertension: Secondary | ICD-10-CM | POA: Diagnosis not present

## 2021-01-14 DIAGNOSIS — E1140 Type 2 diabetes mellitus with diabetic neuropathy, unspecified: Secondary | ICD-10-CM | POA: Diagnosis not present

## 2021-01-14 DIAGNOSIS — E782 Mixed hyperlipidemia: Secondary | ICD-10-CM | POA: Diagnosis not present

## 2021-01-14 DIAGNOSIS — E7800 Pure hypercholesterolemia, unspecified: Secondary | ICD-10-CM | POA: Diagnosis not present

## 2021-01-14 DIAGNOSIS — E1151 Type 2 diabetes mellitus with diabetic peripheral angiopathy without gangrene: Secondary | ICD-10-CM | POA: Diagnosis not present

## 2021-02-03 DIAGNOSIS — Z9889 Other specified postprocedural states: Secondary | ICD-10-CM | POA: Diagnosis not present

## 2021-02-03 DIAGNOSIS — H43813 Vitreous degeneration, bilateral: Secondary | ICD-10-CM | POA: Diagnosis not present

## 2021-02-03 DIAGNOSIS — Z961 Presence of intraocular lens: Secondary | ICD-10-CM | POA: Diagnosis not present

## 2021-02-03 DIAGNOSIS — E119 Type 2 diabetes mellitus without complications: Secondary | ICD-10-CM | POA: Diagnosis not present

## 2021-02-07 DIAGNOSIS — R1310 Dysphagia, unspecified: Secondary | ICD-10-CM | POA: Diagnosis not present

## 2021-02-07 DIAGNOSIS — K219 Gastro-esophageal reflux disease without esophagitis: Secondary | ICD-10-CM | POA: Diagnosis not present

## 2021-02-12 DIAGNOSIS — R413 Other amnesia: Secondary | ICD-10-CM | POA: Diagnosis not present

## 2021-02-12 DIAGNOSIS — I739 Peripheral vascular disease, unspecified: Secondary | ICD-10-CM | POA: Diagnosis not present

## 2021-02-12 DIAGNOSIS — K219 Gastro-esophageal reflux disease without esophagitis: Secondary | ICD-10-CM | POA: Diagnosis not present

## 2021-02-12 DIAGNOSIS — J449 Chronic obstructive pulmonary disease, unspecified: Secondary | ICD-10-CM | POA: Diagnosis not present

## 2021-02-12 DIAGNOSIS — E1151 Type 2 diabetes mellitus with diabetic peripheral angiopathy without gangrene: Secondary | ICD-10-CM | POA: Diagnosis not present

## 2021-02-12 DIAGNOSIS — R1310 Dysphagia, unspecified: Secondary | ICD-10-CM | POA: Diagnosis not present

## 2021-02-12 DIAGNOSIS — E1140 Type 2 diabetes mellitus with diabetic neuropathy, unspecified: Secondary | ICD-10-CM | POA: Diagnosis not present

## 2021-02-12 DIAGNOSIS — I10 Essential (primary) hypertension: Secondary | ICD-10-CM | POA: Diagnosis not present

## 2021-02-27 DIAGNOSIS — J449 Chronic obstructive pulmonary disease, unspecified: Secondary | ICD-10-CM | POA: Diagnosis not present

## 2021-02-27 DIAGNOSIS — I10 Essential (primary) hypertension: Secondary | ICD-10-CM | POA: Diagnosis not present

## 2021-02-27 DIAGNOSIS — K219 Gastro-esophageal reflux disease without esophagitis: Secondary | ICD-10-CM | POA: Diagnosis not present

## 2021-02-27 DIAGNOSIS — N400 Enlarged prostate without lower urinary tract symptoms: Secondary | ICD-10-CM | POA: Diagnosis not present

## 2021-02-27 DIAGNOSIS — E7800 Pure hypercholesterolemia, unspecified: Secondary | ICD-10-CM | POA: Diagnosis not present

## 2021-02-27 DIAGNOSIS — E1151 Type 2 diabetes mellitus with diabetic peripheral angiopathy without gangrene: Secondary | ICD-10-CM | POA: Diagnosis not present

## 2021-02-27 DIAGNOSIS — E782 Mixed hyperlipidemia: Secondary | ICD-10-CM | POA: Diagnosis not present

## 2021-02-27 DIAGNOSIS — E1140 Type 2 diabetes mellitus with diabetic neuropathy, unspecified: Secondary | ICD-10-CM | POA: Diagnosis not present

## 2021-02-27 DIAGNOSIS — F319 Bipolar disorder, unspecified: Secondary | ICD-10-CM | POA: Diagnosis not present

## 2021-06-06 DIAGNOSIS — I739 Peripheral vascular disease, unspecified: Secondary | ICD-10-CM | POA: Diagnosis not present

## 2021-06-06 DIAGNOSIS — Z1389 Encounter for screening for other disorder: Secondary | ICD-10-CM | POA: Diagnosis not present

## 2021-06-06 DIAGNOSIS — E7800 Pure hypercholesterolemia, unspecified: Secondary | ICD-10-CM | POA: Diagnosis not present

## 2021-06-06 DIAGNOSIS — I10 Essential (primary) hypertension: Secondary | ICD-10-CM | POA: Diagnosis not present

## 2021-06-06 DIAGNOSIS — N5201 Erectile dysfunction due to arterial insufficiency: Secondary | ICD-10-CM | POA: Diagnosis not present

## 2021-06-06 DIAGNOSIS — E1140 Type 2 diabetes mellitus with diabetic neuropathy, unspecified: Secondary | ICD-10-CM | POA: Diagnosis not present

## 2021-06-06 DIAGNOSIS — Z7984 Long term (current) use of oral hypoglycemic drugs: Secondary | ICD-10-CM | POA: Diagnosis not present

## 2021-06-06 DIAGNOSIS — E1151 Type 2 diabetes mellitus with diabetic peripheral angiopathy without gangrene: Secondary | ICD-10-CM | POA: Diagnosis not present

## 2021-06-06 DIAGNOSIS — J449 Chronic obstructive pulmonary disease, unspecified: Secondary | ICD-10-CM | POA: Diagnosis not present

## 2021-06-06 DIAGNOSIS — G629 Polyneuropathy, unspecified: Secondary | ICD-10-CM | POA: Diagnosis not present

## 2021-06-06 DIAGNOSIS — E538 Deficiency of other specified B group vitamins: Secondary | ICD-10-CM | POA: Diagnosis not present

## 2021-06-06 DIAGNOSIS — Z0000 Encounter for general adult medical examination without abnormal findings: Secondary | ICD-10-CM | POA: Diagnosis not present

## 2021-06-06 DIAGNOSIS — Z23 Encounter for immunization: Secondary | ICD-10-CM | POA: Diagnosis not present

## 2021-06-16 DIAGNOSIS — E1151 Type 2 diabetes mellitus with diabetic peripheral angiopathy without gangrene: Secondary | ICD-10-CM | POA: Diagnosis not present

## 2021-06-16 DIAGNOSIS — Z7984 Long term (current) use of oral hypoglycemic drugs: Secondary | ICD-10-CM | POA: Diagnosis not present

## 2021-06-26 DIAGNOSIS — I10 Essential (primary) hypertension: Secondary | ICD-10-CM | POA: Diagnosis not present

## 2021-06-26 DIAGNOSIS — E7800 Pure hypercholesterolemia, unspecified: Secondary | ICD-10-CM | POA: Diagnosis not present

## 2021-06-26 DIAGNOSIS — K219 Gastro-esophageal reflux disease without esophagitis: Secondary | ICD-10-CM | POA: Diagnosis not present

## 2021-06-26 DIAGNOSIS — E1151 Type 2 diabetes mellitus with diabetic peripheral angiopathy without gangrene: Secondary | ICD-10-CM | POA: Diagnosis not present

## 2021-06-26 DIAGNOSIS — N400 Enlarged prostate without lower urinary tract symptoms: Secondary | ICD-10-CM | POA: Diagnosis not present

## 2021-06-26 DIAGNOSIS — E782 Mixed hyperlipidemia: Secondary | ICD-10-CM | POA: Diagnosis not present

## 2021-06-26 DIAGNOSIS — J449 Chronic obstructive pulmonary disease, unspecified: Secondary | ICD-10-CM | POA: Diagnosis not present

## 2021-06-26 DIAGNOSIS — F319 Bipolar disorder, unspecified: Secondary | ICD-10-CM | POA: Diagnosis not present

## 2021-06-26 DIAGNOSIS — E1140 Type 2 diabetes mellitus with diabetic neuropathy, unspecified: Secondary | ICD-10-CM | POA: Diagnosis not present

## 2021-08-17 DIAGNOSIS — E1140 Type 2 diabetes mellitus with diabetic neuropathy, unspecified: Secondary | ICD-10-CM | POA: Diagnosis not present

## 2021-08-17 DIAGNOSIS — N400 Enlarged prostate without lower urinary tract symptoms: Secondary | ICD-10-CM | POA: Diagnosis not present

## 2021-08-17 DIAGNOSIS — F319 Bipolar disorder, unspecified: Secondary | ICD-10-CM | POA: Diagnosis not present

## 2021-08-17 DIAGNOSIS — J449 Chronic obstructive pulmonary disease, unspecified: Secondary | ICD-10-CM | POA: Diagnosis not present

## 2021-08-17 DIAGNOSIS — E782 Mixed hyperlipidemia: Secondary | ICD-10-CM | POA: Diagnosis not present

## 2021-08-17 DIAGNOSIS — E7800 Pure hypercholesterolemia, unspecified: Secondary | ICD-10-CM | POA: Diagnosis not present

## 2021-08-17 DIAGNOSIS — K219 Gastro-esophageal reflux disease without esophagitis: Secondary | ICD-10-CM | POA: Diagnosis not present

## 2021-08-17 DIAGNOSIS — E1151 Type 2 diabetes mellitus with diabetic peripheral angiopathy without gangrene: Secondary | ICD-10-CM | POA: Diagnosis not present

## 2021-08-17 DIAGNOSIS — I10 Essential (primary) hypertension: Secondary | ICD-10-CM | POA: Diagnosis not present

## 2021-09-19 DIAGNOSIS — I739 Peripheral vascular disease, unspecified: Secondary | ICD-10-CM | POA: Diagnosis not present

## 2021-09-19 DIAGNOSIS — E1151 Type 2 diabetes mellitus with diabetic peripheral angiopathy without gangrene: Secondary | ICD-10-CM | POA: Diagnosis not present

## 2021-09-19 DIAGNOSIS — Z23 Encounter for immunization: Secondary | ICD-10-CM | POA: Diagnosis not present

## 2021-09-19 DIAGNOSIS — J449 Chronic obstructive pulmonary disease, unspecified: Secondary | ICD-10-CM | POA: Diagnosis not present

## 2021-09-19 DIAGNOSIS — E1140 Type 2 diabetes mellitus with diabetic neuropathy, unspecified: Secondary | ICD-10-CM | POA: Diagnosis not present

## 2021-09-19 DIAGNOSIS — I714 Abdominal aortic aneurysm, without rupture: Secondary | ICD-10-CM | POA: Diagnosis not present

## 2021-09-19 DIAGNOSIS — K219 Gastro-esophageal reflux disease without esophagitis: Secondary | ICD-10-CM | POA: Diagnosis not present

## 2021-09-19 DIAGNOSIS — I10 Essential (primary) hypertension: Secondary | ICD-10-CM | POA: Diagnosis not present

## 2022-01-29 DIAGNOSIS — E1140 Type 2 diabetes mellitus with diabetic neuropathy, unspecified: Secondary | ICD-10-CM | POA: Diagnosis not present

## 2022-01-29 DIAGNOSIS — I10 Essential (primary) hypertension: Secondary | ICD-10-CM | POA: Diagnosis not present

## 2022-01-29 DIAGNOSIS — G629 Polyneuropathy, unspecified: Secondary | ICD-10-CM | POA: Diagnosis not present

## 2022-01-29 DIAGNOSIS — E119 Type 2 diabetes mellitus without complications: Secondary | ICD-10-CM | POA: Diagnosis not present

## 2022-01-29 DIAGNOSIS — J449 Chronic obstructive pulmonary disease, unspecified: Secondary | ICD-10-CM | POA: Diagnosis not present

## 2022-01-29 DIAGNOSIS — I739 Peripheral vascular disease, unspecified: Secondary | ICD-10-CM | POA: Diagnosis not present

## 2022-01-29 DIAGNOSIS — E1151 Type 2 diabetes mellitus with diabetic peripheral angiopathy without gangrene: Secondary | ICD-10-CM | POA: Diagnosis not present

## 2022-01-29 DIAGNOSIS — K219 Gastro-esophageal reflux disease without esophagitis: Secondary | ICD-10-CM | POA: Diagnosis not present

## 2022-02-04 DIAGNOSIS — H43813 Vitreous degeneration, bilateral: Secondary | ICD-10-CM | POA: Diagnosis not present

## 2022-02-04 DIAGNOSIS — E119 Type 2 diabetes mellitus without complications: Secondary | ICD-10-CM | POA: Diagnosis not present

## 2022-02-04 DIAGNOSIS — Z961 Presence of intraocular lens: Secondary | ICD-10-CM | POA: Diagnosis not present

## 2022-02-23 DIAGNOSIS — E1151 Type 2 diabetes mellitus with diabetic peripheral angiopathy without gangrene: Secondary | ICD-10-CM | POA: Diagnosis not present

## 2022-02-23 DIAGNOSIS — E782 Mixed hyperlipidemia: Secondary | ICD-10-CM | POA: Diagnosis not present

## 2022-02-23 DIAGNOSIS — I10 Essential (primary) hypertension: Secondary | ICD-10-CM | POA: Diagnosis not present

## 2022-02-23 DIAGNOSIS — E1140 Type 2 diabetes mellitus with diabetic neuropathy, unspecified: Secondary | ICD-10-CM | POA: Diagnosis not present

## 2022-04-02 DIAGNOSIS — H524 Presbyopia: Secondary | ICD-10-CM | POA: Diagnosis not present

## 2022-04-25 ENCOUNTER — Encounter (HOSPITAL_COMMUNITY): Payer: Self-pay | Admitting: Emergency Medicine

## 2022-04-25 ENCOUNTER — Emergency Department (HOSPITAL_COMMUNITY): Payer: Medicare HMO

## 2022-04-25 ENCOUNTER — Other Ambulatory Visit: Payer: Self-pay

## 2022-04-25 ENCOUNTER — Inpatient Hospital Stay (HOSPITAL_COMMUNITY)
Admission: EM | Admit: 2022-04-25 | Discharge: 2022-04-30 | DRG: 871 | Disposition: A | Discharge status: Home / Self Care | Payer: Medicare HMO | Attending: Internal Medicine | Admitting: Internal Medicine

## 2022-04-25 DIAGNOSIS — R0902 Hypoxemia: Secondary | ICD-10-CM | POA: Diagnosis not present

## 2022-04-25 DIAGNOSIS — R1084 Generalized abdominal pain: Secondary | ICD-10-CM | POA: Diagnosis not present

## 2022-04-25 DIAGNOSIS — K810 Acute cholecystitis: Secondary | ICD-10-CM | POA: Diagnosis not present

## 2022-04-25 DIAGNOSIS — K8000 Calculus of gallbladder with acute cholecystitis without obstruction: Secondary | ICD-10-CM | POA: Diagnosis present

## 2022-04-25 DIAGNOSIS — E782 Mixed hyperlipidemia: Secondary | ICD-10-CM | POA: Diagnosis present

## 2022-04-25 DIAGNOSIS — Z823 Family history of stroke: Secondary | ICD-10-CM | POA: Diagnosis not present

## 2022-04-25 DIAGNOSIS — J9811 Atelectasis: Secondary | ICD-10-CM | POA: Diagnosis not present

## 2022-04-25 DIAGNOSIS — R109 Unspecified abdominal pain: Secondary | ICD-10-CM | POA: Diagnosis not present

## 2022-04-25 DIAGNOSIS — K8042 Calculus of bile duct with acute cholecystitis without obstruction: Secondary | ICD-10-CM

## 2022-04-25 DIAGNOSIS — D649 Anemia, unspecified: Secondary | ICD-10-CM | POA: Diagnosis not present

## 2022-04-25 DIAGNOSIS — Z809 Family history of malignant neoplasm, unspecified: Secondary | ICD-10-CM

## 2022-04-25 DIAGNOSIS — Z8249 Family history of ischemic heart disease and other diseases of the circulatory system: Secondary | ICD-10-CM | POA: Diagnosis not present

## 2022-04-25 DIAGNOSIS — R1011 Right upper quadrant pain: Secondary | ICD-10-CM | POA: Diagnosis present

## 2022-04-25 DIAGNOSIS — Z79899 Other long term (current) drug therapy: Secondary | ICD-10-CM

## 2022-04-25 DIAGNOSIS — R000 Tachycardia, unspecified: Secondary | ICD-10-CM | POA: Diagnosis not present

## 2022-04-25 DIAGNOSIS — K5790 Diverticulosis of intestine, part unspecified, without perforation or abscess without bleeding: Secondary | ICD-10-CM | POA: Diagnosis present

## 2022-04-25 DIAGNOSIS — I480 Paroxysmal atrial fibrillation: Secondary | ICD-10-CM

## 2022-04-25 DIAGNOSIS — G8929 Other chronic pain: Secondary | ICD-10-CM | POA: Diagnosis present

## 2022-04-25 DIAGNOSIS — K8062 Calculus of gallbladder and bile duct with acute cholecystitis without obstruction: Secondary | ICD-10-CM | POA: Diagnosis present

## 2022-04-25 DIAGNOSIS — A4151 Sepsis due to Escherichia coli [E. coli]: Secondary | ICD-10-CM | POA: Diagnosis present

## 2022-04-25 DIAGNOSIS — I5021 Acute systolic (congestive) heart failure: Secondary | ICD-10-CM | POA: Diagnosis present

## 2022-04-25 DIAGNOSIS — K219 Gastro-esophageal reflux disease without esophagitis: Secondary | ICD-10-CM | POA: Diagnosis present

## 2022-04-25 DIAGNOSIS — Z87891 Personal history of nicotine dependence: Secondary | ICD-10-CM | POA: Diagnosis not present

## 2022-04-25 DIAGNOSIS — K819 Cholecystitis, unspecified: Principal | ICD-10-CM

## 2022-04-25 DIAGNOSIS — K2100 Gastro-esophageal reflux disease with esophagitis, without bleeding: Secondary | ICD-10-CM | POA: Diagnosis not present

## 2022-04-25 DIAGNOSIS — W19XXXA Unspecified fall, initial encounter: Secondary | ICD-10-CM | POA: Diagnosis not present

## 2022-04-25 DIAGNOSIS — R9431 Abnormal electrocardiogram [ECG] [EKG]: Secondary | ICD-10-CM | POA: Diagnosis not present

## 2022-04-25 DIAGNOSIS — N400 Enlarged prostate without lower urinary tract symptoms: Secondary | ICD-10-CM | POA: Diagnosis present

## 2022-04-25 DIAGNOSIS — I7143 Infrarenal abdominal aortic aneurysm, without rupture: Secondary | ICD-10-CM | POA: Diagnosis present

## 2022-04-25 DIAGNOSIS — R079 Chest pain, unspecified: Secondary | ICD-10-CM | POA: Diagnosis not present

## 2022-04-25 DIAGNOSIS — A419 Sepsis, unspecified organism: Secondary | ICD-10-CM | POA: Diagnosis not present

## 2022-04-25 DIAGNOSIS — F319 Bipolar disorder, unspecified: Secondary | ICD-10-CM | POA: Diagnosis present

## 2022-04-25 DIAGNOSIS — Z9359 Other cystostomy status: Secondary | ICD-10-CM | POA: Diagnosis not present

## 2022-04-25 DIAGNOSIS — R531 Weakness: Secondary | ICD-10-CM | POA: Diagnosis not present

## 2022-04-25 DIAGNOSIS — E1142 Type 2 diabetes mellitus with diabetic polyneuropathy: Secondary | ICD-10-CM | POA: Diagnosis present

## 2022-04-25 DIAGNOSIS — I714 Abdominal aortic aneurysm, without rupture, unspecified: Secondary | ICD-10-CM | POA: Diagnosis not present

## 2022-04-25 DIAGNOSIS — I4892 Unspecified atrial flutter: Secondary | ICD-10-CM | POA: Diagnosis not present

## 2022-04-25 DIAGNOSIS — Z7984 Long term (current) use of oral hypoglycemic drugs: Secondary | ICD-10-CM | POA: Diagnosis not present

## 2022-04-25 DIAGNOSIS — I959 Hypotension, unspecified: Secondary | ICD-10-CM | POA: Diagnosis not present

## 2022-04-25 DIAGNOSIS — Z7982 Long term (current) use of aspirin: Secondary | ICD-10-CM

## 2022-04-25 DIAGNOSIS — K8050 Calculus of bile duct without cholangitis or cholecystitis without obstruction: Secondary | ICD-10-CM | POA: Diagnosis not present

## 2022-04-25 DIAGNOSIS — J449 Chronic obstructive pulmonary disease, unspecified: Secondary | ICD-10-CM | POA: Diagnosis present

## 2022-04-25 DIAGNOSIS — I10 Essential (primary) hypertension: Secondary | ICD-10-CM | POA: Diagnosis not present

## 2022-04-25 DIAGNOSIS — I5041 Acute combined systolic (congestive) and diastolic (congestive) heart failure: Secondary | ICD-10-CM | POA: Diagnosis not present

## 2022-04-25 DIAGNOSIS — I110 Hypertensive heart disease with heart failure: Secondary | ICD-10-CM | POA: Diagnosis present

## 2022-04-25 DIAGNOSIS — R609 Edema, unspecified: Secondary | ICD-10-CM | POA: Diagnosis not present

## 2022-04-25 DIAGNOSIS — M549 Dorsalgia, unspecified: Secondary | ICD-10-CM | POA: Diagnosis present

## 2022-04-25 DIAGNOSIS — R1013 Epigastric pain: Secondary | ICD-10-CM | POA: Diagnosis not present

## 2022-04-25 LAB — URINALYSIS, MICROSCOPIC (REFLEX): Bacteria, UA: NONE SEEN

## 2022-04-25 LAB — CBC
HCT: 44.9 % (ref 39.0–52.0)
Hemoglobin: 14.9 g/dL (ref 13.0–17.0)
MCH: 30.8 pg (ref 26.0–34.0)
MCHC: 33.2 g/dL (ref 30.0–36.0)
MCV: 93 fL (ref 80.0–100.0)
Platelets: 251 10*3/uL (ref 150–400)
RBC: 4.83 MIL/uL (ref 4.22–5.81)
RDW: 14 % (ref 11.5–15.5)
WBC: 27.9 10*3/uL — ABNORMAL HIGH (ref 4.0–10.5)
nRBC: 0 % (ref 0.0–0.2)

## 2022-04-25 LAB — LIPASE, BLOOD: Lipase: 25 U/L (ref 11–51)

## 2022-04-25 LAB — COMPREHENSIVE METABOLIC PANEL
ALT: 21 U/L (ref 0–44)
AST: 23 U/L (ref 15–41)
Albumin: 4 g/dL (ref 3.5–5.0)
Alkaline Phosphatase: 64 U/L (ref 38–126)
Anion gap: 13 (ref 5–15)
BUN: 15 mg/dL (ref 8–23)
CO2: 22 mmol/L (ref 22–32)
Calcium: 9.7 mg/dL (ref 8.9–10.3)
Chloride: 100 mmol/L (ref 98–111)
Creatinine, Ser: 1.02 mg/dL (ref 0.61–1.24)
GFR, Estimated: >60 mL/min (ref >60)
Glucose, Bld: 197 mg/dL — ABNORMAL HIGH (ref 70–99)
Potassium: 4.2 mmol/L (ref 3.5–5.1)
Sodium: 135 mmol/L (ref 135–145)
Total Bilirubin: 1.1 mg/dL (ref 0.3–1.2)
Total Protein: 7.5 g/dL (ref 6.5–8.1)

## 2022-04-25 LAB — LACTIC ACID, PLASMA
Lactic Acid, Venous: 2.3 mmol/L (ref 0.5–1.9)
Lactic Acid, Venous: 2.5 mmol/L (ref 0.5–1.9)

## 2022-04-25 LAB — URINALYSIS, ROUTINE W REFLEX MICROSCOPIC
Bilirubin Urine: NEGATIVE
Glucose, UA: 100 mg/dL — AB
Ketones, ur: NEGATIVE mg/dL
Nitrite: POSITIVE — AB
Protein, ur: NEGATIVE mg/dL
Specific Gravity, Urine: <1.005 — ABNORMAL LOW (ref 1.005–1.030)
pH: 6 (ref 5.0–8.0)

## 2022-04-25 LAB — CBG MONITORING, ED: Glucose-Capillary: 162 mg/dL — ABNORMAL HIGH (ref 70–99)

## 2022-04-25 MED ORDER — PIPERACILLIN-TAZOBACTAM 3.375 G IVPB
3.3750 g | Freq: Three times a day (TID) | INTRAVENOUS | Status: DC
  Administered 2022-04-25 – 2022-04-28 (×8): 3.375 g via INTRAVENOUS
  Filled 2022-04-25 (×6): qty 50

## 2022-04-25 MED ORDER — ATORVASTATIN CALCIUM 40 MG PO TABS
40.0000 mg | ORAL_TABLET | Freq: Every day | ORAL | Status: DC
  Administered 2022-04-26 – 2022-04-30 (×5): 40 mg via ORAL
  Filled 2022-04-25 (×5): qty 1

## 2022-04-25 MED ORDER — ONDANSETRON HCL 4 MG/2ML IJ SOLN: 4.0000 mg | Freq: Four times a day (QID) | INTRAMUSCULAR | Status: DC | PRN

## 2022-04-25 MED ORDER — PIPERACILLIN-TAZOBACTAM 3.375 G IVPB 30 MIN
3.3750 g | Freq: Once | INTRAVENOUS | Status: AC
  Administered 2022-04-25: 3.375 g via INTRAVENOUS
  Filled 2022-04-25: qty 50

## 2022-04-25 MED ORDER — MORPHINE SULFATE (PF) 4 MG/ML IV SOLN
4.0000 mg | Freq: Once | INTRAVENOUS | Status: AC
  Administered 2022-04-25: 4 mg via INTRAVENOUS
  Filled 2022-04-25: qty 1

## 2022-04-25 MED ORDER — PANTOPRAZOLE SODIUM 40 MG IV SOLR
40.0000 mg | INTRAVENOUS | Status: DC
  Administered 2022-04-25 – 2022-04-29 (×5): 40 mg via INTRAVENOUS
  Filled 2022-04-25 (×6): qty 10

## 2022-04-25 MED ORDER — ONDANSETRON HCL 4 MG/2ML IJ SOLN
4.0000 mg | Freq: Once | INTRAMUSCULAR | Status: AC
  Administered 2022-04-25: 4 mg via INTRAVENOUS
  Filled 2022-04-25: qty 2

## 2022-04-25 MED ORDER — LACTATED RINGERS IV BOLUS
500.0000 mL | Freq: Once | INTRAVENOUS | Status: AC
  Administered 2022-04-25: 500 mL via INTRAVENOUS

## 2022-04-25 MED ORDER — LACTATED RINGERS IV SOLN: INTRAVENOUS | Status: DC

## 2022-04-25 MED ORDER — ACETAMINOPHEN 650 MG RE SUPP: 650.0000 mg | Freq: Four times a day (QID) | RECTAL | Status: DC | PRN

## 2022-04-25 MED ORDER — MORPHINE SULFATE (PF) 2 MG/ML IV SOLN
2.0000 mg | INTRAVENOUS | Status: DC | PRN
  Filled 2022-04-25: qty 1

## 2022-04-25 MED ORDER — SODIUM CHLORIDE 0.9 % IV BOLUS
1000.0000 mL | Freq: Once | INTRAVENOUS | Status: AC
  Administered 2022-04-25: 1000 mL via INTRAVENOUS

## 2022-04-25 MED ORDER — IOHEXOL 300 MG/ML  SOLN
100.0000 mL | Freq: Once | INTRAMUSCULAR | Status: AC | PRN
  Administered 2022-04-25: 100 mL via INTRAVENOUS

## 2022-04-25 MED ORDER — INSULIN ASPART 100 UNIT/ML IJ SOLN
0.0000 [IU] | Freq: Three times a day (TID) | INTRAMUSCULAR | Status: DC
  Administered 2022-04-25: 2 [IU] via SUBCUTANEOUS
  Administered 2022-04-26: 1 [IU] via SUBCUTANEOUS
  Administered 2022-04-26 – 2022-04-27 (×3): 2 [IU] via SUBCUTANEOUS
  Administered 2022-04-28 – 2022-04-30 (×7): 1 [IU] via SUBCUTANEOUS
  Administered 2022-04-30: 2 [IU] via SUBCUTANEOUS

## 2022-04-25 MED ORDER — SODIUM CHLORIDE 0.9% FLUSH
3.0000 mL | Freq: Once | INTRAVENOUS | Status: AC
  Administered 2022-04-25: 3 mL via INTRAVENOUS

## 2022-04-25 MED ORDER — HYDROMORPHONE HCL 1 MG/ML IJ SOLN
0.5000 mg | INTRAMUSCULAR | Status: DC | PRN
Start: 2022-04-25 — End: 2022-04-26
  Administered 2022-04-26 (×2): 0.5 mg via INTRAVENOUS
  Filled 2022-04-25 (×2): qty 0.5

## 2022-04-25 MED ORDER — ONDANSETRON HCL 4 MG PO TABS: 4.0000 mg | ORAL_TABLET | Freq: Four times a day (QID) | ORAL | Status: DC | PRN

## 2022-04-25 MED ORDER — ACETAMINOPHEN 325 MG PO TABS
650.0000 mg | ORAL_TABLET | Freq: Four times a day (QID) | ORAL | Status: DC | PRN
  Administered 2022-04-26 – 2022-04-29 (×7): 650 mg via ORAL
  Filled 2022-04-25 (×7): qty 2

## 2022-04-25 NOTE — Assessment & Plan Note (Addendum)
-  Infrarenal AAA measuring 4cm, small increase since 2021 [place] ?-Recommend routine f/u with vascular  ?

## 2022-04-25 NOTE — Assessment & Plan Note (Addendum)
-  s/p cholecystostomy 4/30, continue routine drain care/flushed and output monitoring. Will follow up with IR for cholangiogram after discharge. If still choledocholithiasis, would need ERCP. Will need eventual lap cholecystectomy per surgery. LFTs remain reassuring. ?-Follow-up with interventional radiology, gastroenterology in the outpatient setting and continue drain management per IR ?- Based on pansensitivity of E. coli in GB culture, switch to ancef per pharmacy. WBC improving, still elevated. ?-WBC went from 27.9 -> 15.7 -> 14.0 -> 11.1 and is now 9.1 ?- S. hominis growing in 1 of 3 blood cultures consistent with contaminant.   ?-Sepsis improving  ?-I spoke with infectious diseases Dr. Linus Salmons who recommends a total of 2 weeks of antibiotic coverage and recommended changing to p.o. cefadroxil 1000 mg twice daily for completion of his antibiotic duration ? ?

## 2022-04-25 NOTE — H&P (Addendum)
?History and Physical  ? ? ?Patient: Douglas Hurst. [id number] DOB: 03/21/1941 ?DOA: 04/25/2022 ?DOS: the patient was seen and examined on 04/25/2022 ?PCP: Lavone Orn, MD  ?Patient coming from: Home - lives with wife. Ambulates independently  ? ? ?Chief Complaint: abdominal pain and weakness  ? ?HPI: Douglas Hurst. is a 81 y.o. male with medical history significant of COPD, HLD, HTN, T2DM, GERD, bipolar disorder, BPH, chronic back pain who presented to ED with complaints of RUQ abdominal pain and weakness.  He states he has been weak for one week and has had worsening RUQ pain over the last 3-4 days, but was severe yesterday. Last night he tried to get up out of bed and was on the ground. Wife thinks he collapsed and was too weak to get up. When she was able to get him in the bed, she states he just wouldn't move anything. Pain in the RUQ rated as a 10/10 with no radiation. Nothing seems to make it better or worse, doesn't think food exacerbates it. He has no nausea/vomiting or diarrhea. subjective fever at home yesterday, but no recorded fevers. He has not eaten since yesterday at noon.  ? ?He denies any vision changes/headaches, chest pain or palpitations, shortness of breath or cough, no  N/V/D, dysuria or leg swelling.  ? ?He does not smoke or drink  ? ? ?ER Course:  vitals: afebrile, bp: 143/65, HR: 104, RR: 18, oxygen: 97%RA ?Pertinent labs: wbc: 27.9, uA: +nitrite,  ?CXR: bibasilar atelectasis ?CT abdomen/pelvis: 5 mm stone within the mid common bile duct with mild proximal biliary dilatation. There is also a 1.3 cm gallstone at the neck ?with mild pericholecystic fat infiltration raising the possibility ?of acute cholecystitis. ?Infrarenal AAA measuring 4cm. Small increase since 2021 [place].  ?In ED: general surgery and GI consulted. Given 1L bolus, zosyn, zofran and morphine.  ? ? ?Review of Systems: As mentioned in the history of present illness. All other systems reviewed and are negative. ?Past  Medical History:  ?Diagnosis Date  ? Bipolar disorder ([place])   ? Chronic back pain   ? COPD (chronic obstructive pulmonary disease) ([place])   ? Dr. Laurann Montana  ? Diverticulosis   ? ED (erectile dysfunction)   ? Hiatal hernia   ? Hyperlipidemia   ? Hypertension   ? ?Past Surgical History:  ?Procedure Laterality Date  ? BALLOON DILATION N/A 10/01/2015  ? Procedure: BALLOON DILATION;  Surgeon: Garlan Fair, MD;  Location: [facility];  Service: Endoscopy;  Laterality: N/A;  ? CATARACT EXTRACTION, BILATERAL    ? COLONOSCOPY    ? removed polyps  ? COLONOSCOPY WITH PROPOFOL N/A 09/01/2016  ? Procedure: COLONOSCOPY WITH PROPOFOL;  Surgeon: Garlan Fair, MD;  Location: [facility];  Service: Endoscopy;  Laterality: N/A;  ? ESOPHAGOGASTRODUODENOSCOPY (EGD) WITH PROPOFOL N/A 10/01/2015  ? Procedure: ESOPHAGOGASTRODUODENOSCOPY (EGD) WITH PROPOFOL;  Surgeon: Garlan Fair, MD;  Location: [facility];  Service: Endoscopy;  Laterality: N/A;  ? KNEE ARTHROSCOPY    ? TONSILLECTOMY    ? ?Social History:  reports that he has quit smoking. He has never used smokeless tobacco. He reports that he does not drink alcohol and does not use drugs. ? ?No Known Allergies ? ?Family History  ?Problem Relation Age of Onset  ? Heart attack Mother   ? Cancer - Colon Mother   ? CAD Mother   ? Cancer Mother   ? Dementia Mother   ? CAD Father   ? Cancer  Father   ? CVA Father   ? Dementia Father   ? Heart attack Brother   ? CAD Brother   ? ? ?Prior to Admission medications   ?Medication Sig Start Date End Date Taking? Authorizing Provider  ?amLODipine (NORVASC) 5 MG tablet Take 5 mg by mouth daily. 03/31/22  Yes [provider]  ?aspirin EC 81 MG tablet Take 81 mg by mouth daily. Swallow whole.   Yes [provider]  ?atorvastatin (LIPITOR) 40 MG tablet Take 40 mg by mouth daily.   Yes [provider]  ?metFORMIN (GLUCOPHAGE-XR) 500 MG 24 hr tablet Take 500-1,000 mg by mouth See admin instructions. Take 2 tablets (1000 mg)  in the morning and 1 tablet (500 mg) at night 03/17/22  Yes [provider]  ?Multiple Vitamin (MULTIVITAMIN) tablet Take 1 tablet by mouth daily.   Yes [provider]  ?pantoprazole (PROTONIX) 40 MG tablet Take 40 mg by mouth daily.   Yes [provider]  ?tadalafil (CIALIS) 20 MG tablet Take 20 mg by mouth every three (3) days as needed for erectile dysfunction. 03/29/22  Yes [provider]  ?telmisartan (MICARDIS) 80 MG tablet Take 80 mg by mouth daily. 02/23/22  Yes [provider]  ? ? ?Physical Exam: ?Vitals:  ? 04/25/22 1400 04/25/22 1430 04/25/22 1500 04/25/22 1530  ?BP: 129/69 125/73 110/73 125/72  ?Pulse: (!) 109 100 100 97  ?Resp: (!) 21 16 (!) 24 (!) 21  ?Temp:      ?SpO2: 93% 95% 95% 92%  ? ?General:  Appears calm and comfortable and is in NAD ?Eyes:  PERRL, EOMI, normal lids, iris ?ENT:  grossly normal hearing-HA, lips & tongue, dry mucous membranes; appropriate dentition ?Neck:  no LAD, masses or thyromegaly; no carotid bruits ?Cardiovascular:  RRR, no m/r/g. No LE edema.  ?Respiratory:   CTA bilaterally with no wheezes/rales/rhonchi.  Normal respiratory effort. ?Abdomen:  soft, TTP in epigastric, suprapubic and RUQ. +murphy sign. Equivocal rebound, no guarding, ND, NABS ?Back:   normal alignment, no CVAT ?Skin:  no rash or induration seen on limited exam ?Musculoskeletal:  grossly normal tone BUE/BLE, good ROM, no bony abnormality ?Lower extremity:  No LE edema.  Limited foot exam with no ulcerations.  2+ distal pulses. ?Psychiatric:  grossly normal mood and affect, speech fluent and appropriate, AOx3 ?Neurologic:  CN 2-12 grossly intact, moves all extremities in coordinated fashion, sensation intact ? ? ?Radiological Exams on Admission: ?Independently reviewed - see discussion in A/P where applicable ? ?CT ABDOMEN PELVIS W CONTRAST ? ?Result Date: 04/25/2022 ?CLINICAL DATA:  Abdominal pain, acute, nonlocalized EXAM: CT ABDOMEN AND PELVIS WITH CONTRAST  TECHNIQUE: Multidetector CT imaging of the abdomen and pelvis was performed using the standard protocol following bolus administration of intravenous contrast. RADIATION DOSE REDUCTION: This exam was performed according to the departmental dose-optimization program which includes automated exposure control, adjustment of the mA and/or kV according to patient size and/or use of iterative reconstruction technique. CONTRAST:  142m OMNIPAQUE IOHEXOL 300 MG/ML  SOLN COMPARISON:  None. FINDINGS: Lower chest: Bibasilar atelectasis. Hepatobiliary: No focal liver abnormality. 1.3 cm gallstone at the neck. Mild pericholecystic fat infiltration. 5 mm stone within the mid common bile duct. There is mild proximal biliary dilatation. Pancreas: Unremarkable. Spleen: Unremarkable. Adrenals/Urinary Tract: Adrenals are unremarkable. No hydronephrosis or renal calculi. Circumferential bladder wall thickening probably due to chronic outlet obstruction. Stomach/Bowel: Stomach is within normal limits. Bowel is normal in caliber. A duodenal diverticulum is noted. Moderate stool throughout  the colon. Vascular/Lymphatic: Atherosclerosis. 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 Reproductive: Enlarged prostate. Other: No free fluid.  No acute abnormality of the abdominal wall. Musculoskeletal: Degenerative changes of the included spine. IMPRESSION: 5 mm stone within the mid common bile duct with mild proximal biliary dilatation. There is also a 1.3 cm gallstone at the neck with mild pericholecystic fat infiltration raising the possibility of acute cholecystitis. 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 This may represent a small increase since the 2021 ultrasound. Recommend follow-up every 12 months and vascular consultation. Circumferential bladder wall thickening probably due to chronic outlet obstruction from enlarged  prostate. Electronically Signed   By: Macy Mis M.D.   On: 04/25/2022 12:31  ? ?DG Chest Portable 1 View ? ?Result Date: 04/25/2022 ?CLINICAL DATA:  Generalized weakness, RIGHT upper quadrant pain EXAM: POR

## 2022-04-25 NOTE — Assessment & Plan Note (Addendum)
-  Continue Statin with Atorvastatin 40 mg po daily  ?

## 2022-04-25 NOTE — ED Notes (Signed)
I attempted twice to get pt's blood cultures and was unsuccessful. I will notify the tech to collect cultures. ?

## 2022-04-25 NOTE — Progress Notes (Signed)
Pharmacy Antibiotic Note ? ?Douglas Hurst. is a 81 y.o. male admitted on 04/25/2022 with  intra-abdominal infection .  Pharmacy has been consulted for Zosyn dosing.  ? ?WBC elevated. SCr wnl.  ? ?Plan: ?-Zosyn 3.375 gm IV Q 8 hours (EI infusion) ?-Monitor CBC, renal fx, cultures and clinical progress ? ?  ? ?Temp (24hrs), Avg:98.7 ?F (37.1 ?C), Min:98.7 ?F (37.1 ?C), Max:98.7 ?F (37.1 ?C) ? ?Recent Labs  ?Lab 04/25/22 ?5027  ?WBC 27.9*  ?CREATININE 1.02  ?  ?CrCl cannot be calculated (Unknown ideal weight.).   ? ?No Known Allergies ? ?Antimicrobials this admission: ?Zosyn 4/29 >>  ? ?Dose adjustments this admission: ? ? ?Microbiology results: ? ? ?Thank you for allowing pharmacy to be a part of this patient?s care. ? ?Albertina Parr, PharmD., BCCCP ?Clinical Pharmacist ?Please refer to AMION for unit-specific pharmacist  ? ? ?

## 2022-04-25 NOTE — ED Provider Notes (Signed)
?Douglas Hurst ?Provider Note ? ? ?CSN: [id number] ?Arrival date & time: 04/25/22  3818 ? ?  ? ?History ? ?Chief Complaint  ?Patient presents with  ? Abdominal Pain  ? Weakness  ? ? ?Douglas Ozment. is a 81 y.o. male. ? ?Pt is a 81 yo male with a pmhx significant for htn, hyperlipidemia, diverticulosis, bipolar d/o, and copd.  Pt said he's had abd pain for 1 week.  He has not had a definite fever, but his wife said he broke out in a sweat in the middle of the night and soaked the bed.  Pt has nausea, but no vomiting. ? ? ?  ? ?Home Medications ?Prior to Admission medications   ?Medication Sig Start Date End Date Taking? Authorizing Provider  ?amLODipine (NORVASC) 5 MG tablet Take 5 mg by mouth daily. 03/31/22  Yes [provider]  ?aspirin EC 81 MG tablet Take 81 mg by mouth daily. Swallow whole.   Yes [provider]  ?atorvastatin (LIPITOR) 40 MG tablet Take 40 mg by mouth daily.   Yes [provider]  ?metFORMIN (GLUCOPHAGE-XR) 500 MG 24 hr tablet Take 500-1,000 mg by mouth See admin instructions. Take 2 tablets (1000 mg) in the morning and 1 tablet (500 mg) at night 03/17/22  Yes [provider]  ?Multiple Vitamin (MULTIVITAMIN) tablet Take 1 tablet by mouth daily.   Yes [provider]  ?pantoprazole (PROTONIX) 40 MG tablet Take 40 mg by mouth daily.   Yes [provider]  ?tadalafil (CIALIS) 20 MG tablet Take 20 mg by mouth every three (3) days as needed for erectile dysfunction. 03/29/22  Yes [provider]  ?telmisartan (MICARDIS) 80 MG tablet Take 80 mg by mouth daily. 02/23/22  Yes [provider]  ?   ? ?Allergies    ?Patient has no known allergies.   ? ?Review of Systems   ?Review of Systems  ?Constitutional:  Positive for chills.  ?Gastrointestinal:  Positive for abdominal pain.  ?All other systems reviewed and are negative. ? ?Physical Exam ?Updated Vital Signs ?BP (!) 84/50 (BP Location: Right Arm)    Pulse 76   Temp (!) 97.5 ?F (36.4 ?C) (Oral)   Resp 15   Ht '6\' 2"'$  (1.88 m)   Wt 84.8 kg   SpO2 93%   BMI 24.00 kg/m?  ?Physical Exam ?Vitals and nursing note reviewed.  ?Constitutional:   ?   Appearance: He is well-developed. He is ill-appearing.  ?HENT:  ?   Head: Normocephalic and atraumatic.  ?   Mouth/Throat:  ?   Mouth: Mucous membranes are dry.  ?Eyes:  ?   Extraocular Movements: Extraocular movements intact.  ?   Pupils: Pupils are equal, round, and reactive to light.  ?Cardiovascular:  ?   Rate and Rhythm: Regular rhythm. Tachycardia present.  ?Pulmonary:  ?   Effort: Pulmonary effort is normal.  ?   Breath sounds: Normal breath sounds.  ?Abdominal:  ?   General: Abdomen is flat. Bowel sounds are normal.  ?   Palpations: Abdomen is soft.  ?   Tenderness: There is generalized abdominal tenderness.  ?Skin: ?   General: Skin is warm and dry.  ?   Capillary Refill: Capillary refill takes less than 2 seconds.  ?Neurological:  ?   General: No focal deficit present.  ?   Mental Status: He is alert and oriented to person, place, and time.  ?Psychiatric:     ?  Mood and Affect: Mood normal.     ?   Behavior: Behavior normal.  ? ? ?ED Results / Procedures / Treatments   ?Labs ?(all labs ordered are listed, but only abnormal results are displayed) ?Labs Reviewed  ?CBC - Abnormal; Notable for the following components:  ?    Result Value  ? WBC 27.9 (*)   ? All other components within normal limits  ?URINALYSIS, ROUTINE W REFLEX MICROSCOPIC - Abnormal; Notable for the following components:  ? Specific Gravity, Urine <1.005 (*)   ? Glucose, UA 100 (*)   ? Hgb urine dipstick TRACE (*)   ? Nitrite POSITIVE (*)   ? Leukocytes,Ua SMALL (*)   ? All other components within normal limits  ?COMPREHENSIVE METABOLIC PANEL - Abnormal; Notable for the following components:  ? Glucose, Bld 197 (*)   ? All other components within normal limits  ?LACTIC ACID, PLASMA - Abnormal; Notable for the following components:  ? Lactic  Acid, Venous 2.3 (*)   ? All other components within normal limits  ?LACTIC ACID, PLASMA - Abnormal; Notable for the following components:  ? Lactic Acid, Venous 2.5 (*)   ? All other components within normal limits  ?COMPREHENSIVE METABOLIC PANEL - Abnormal; Notable for the following components:  ? CO2 21 (*)   ? Glucose, Bld 157 (*)   ? Total Protein 6.4 (*)   ? Albumin 3.2 (*)   ? All other components within normal limits  ?CBC - Abnormal; Notable for the following components:  ? WBC 27.9 (*)   ? All other components within normal limits  ?GLUCOSE, CAPILLARY - Abnormal; Notable for the following components:  ? Glucose-Capillary 147 (*)   ? All other components within normal limits  ?GLUCOSE, CAPILLARY - Abnormal; Notable for the following components:  ? Glucose-Capillary 152 (*)   ? All other components within normal limits  ?PROTIME-INR - Abnormal; Notable for the following components:  ? Prothrombin Time 16.0 (*)   ? INR 1.3 (*)   ? All other components within normal limits  ?GLUCOSE, CAPILLARY - Abnormal; Notable for the following components:  ? Glucose-Capillary 171 (*)   ? All other components within normal limits  ?GLUCOSE, CAPILLARY - Abnormal; Notable for the following components:  ? Glucose-Capillary 165 (*)   ? All other components within normal limits  ?GLUCOSE, CAPILLARY - Abnormal; Notable for the following components:  ? Glucose-Capillary 179 (*)   ? All other components within normal limits  ?CBG MONITORING, ED - Abnormal; Notable for the following components:  ? Glucose-Capillary 162 (*)   ? All other components within normal limits  ?TROPONIN I (HIGH SENSITIVITY) - Abnormal; Notable for the following components:  ? Troponin I (High Sensitivity) 24 (*)   ? All other components within normal limits  ?TROPONIN I (HIGH SENSITIVITY) - Abnormal; Notable for the following components:  ? Troponin I (High Sensitivity) 26 (*)   ? All other components within normal limits  ?CULTURE, BLOOD (ROUTINE X 2)   ?CULTURE, BLOOD (ROUTINE X 2)  ?AEROBIC/ANAEROBIC CULTURE W GRAM STAIN (SURGICAL/DEEP WOUND)  ?BLOOD CULTURE ID PANEL (REFLEXED) - BCID2  ?LIPASE, BLOOD  ?URINALYSIS, MICROSCOPIC (REFLEX)  ? ? ?EKG ?EKG Interpretation ? ?Date/Time:  Saturday April 25 2022 09:45:47 EDT ?Ventricular Rate:  102 ?PR Interval:  124 ?QRS Duration: 86 ?QT Interval:  344 ?QTC Calculation: 448 ?R Axis:   40 ?Text Interpretation: Sinus tachycardia Nonspecific ST and T wave abnormality Abnormal ECG When compared with ECG of 02-Oct-2015  19:47, PREVIOUS ECG IS PRESENT No significant change since last tracing Confirmed by Isla Pence [phone]) on 04/25/2022 10:55:46 AM ? ?Radiology ?CT ABDOMEN PELVIS W CONTRAST ? ?Result Date: 04/25/2022 ?CLINICAL DATA:  Abdominal pain, acute, nonlocalized EXAM: CT ABDOMEN AND PELVIS WITH CONTRAST TECHNIQUE: Multidetector CT imaging of the abdomen and pelvis was performed using the standard protocol following bolus administration of intravenous contrast. RADIATION DOSE REDUCTION: This exam was performed according to the departmental dose-optimization program which includes automated exposure control, adjustment of the mA and/or kV according to patient size and/or use of iterative reconstruction technique. CONTRAST:  135m OMNIPAQUE IOHEXOL 300 MG/ML  SOLN COMPARISON:  None. FINDINGS: Lower chest: Bibasilar atelectasis. Hepatobiliary: No focal liver abnormality. 1.3 cm gallstone at the neck. Mild pericholecystic fat infiltration. 5 mm stone within the mid common bile duct. There is mild proximal biliary dilatation. Pancreas: Unremarkable. Spleen: Unremarkable. Adrenals/Urinary Tract: Adrenals are unremarkable. No hydronephrosis or renal calculi. Circumferential bladder wall thickening probably due to chronic outlet obstruction. Stomach/Bowel: Stomach is within normal limits. Bowel is normal in caliber. A duodenal diverticulum is noted. Moderate stool throughout the colon. Vascular/Lymphatic: Atherosclerosis.  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 Reproductive: Enlarged prostate. Other: No free fluid.  No acute abn

## 2022-04-25 NOTE — Assessment & Plan Note (Addendum)
-  Stable and Quiescent  ?

## 2022-04-25 NOTE — Assessment & Plan Note (Addendum)
-  Well controlled, A1C of 6.5 in 01/2022 ?-Continue to Hold metformin while inpatient ?-C/w  Sensitive Novolog SSI AC and accuchecks  ?-CBGs ranging from 121-185 ?

## 2022-04-25 NOTE — ED Notes (Signed)
Tech was unsuccessful with getting cultures and I notified phlebotomy to attempt to get cultures. ?

## 2022-04-25 NOTE — ED Notes (Signed)
Pt attempted urine collection in triage, pt stated could not urinate at this time. This NT notified pt that when pt needs to go let us know so we can collect urine sample. Pt acknowledges. ?

## 2022-04-25 NOTE — ED Notes (Signed)
X-ray at bedside

## 2022-04-25 NOTE — Assessment & Plan Note (Addendum)
-  Stable,  ?-No active Bronchospasm ?-Avoiding  Albuterol with no wheezing to avoid worsening RVR  ?-Currently not on any inhalers  ?

## 2022-04-25 NOTE — ED Triage Notes (Signed)
Pt to triage via [facility] from home.  Reports generalized weakness x 1 week.  Reports RUQ pain with mass and rebound tenderness x 3-4 days.   ? ?Weakness worse this morning.  He fell and didn't hit his head. Denies injury. No blood thinners.   ? ?EMS- ?CBG 234 ?IV- 20g L hand ?

## 2022-04-25 NOTE — Assessment & Plan Note (Addendum)
-  Well controlled-soft pressures in setting of sepsis ?-Holding home meds for now (micardis '80mg'$  and novasc '5mg'$  daily? ?-Will D/C Home Amlodipine given New Diagnosis of HFrEF ?-C/w Metoprolol Tartrate 25 mg po BID ?-Continue to Monitor BP per Protocol ?-Last BP reading was 153/87 ?

## 2022-04-25 NOTE — Consult Note (Signed)
Referring Provider: Dr. Gilford Raid ?Primary Care Physician:  Lavone Orn, MD ?Primary Gastroenterologist: Althia Forts ? ?Reason for Consultation:  Abdominal pain; Biliary colic ? ?HPI: Douglas Hurst. is a 81 y.o. male with 3 days of RUQ and epigastric pain that was sharp, 10/10 intensity and constant. Denies associated N/V/D. Felt very hot yesterday. No appetite. Wife at bedside. Normal LFTs, lipase. CT suggests acute cholecystitis and shows a 1.3 cm GS at the neck and 5 mm stone in the mid common bile duct with mild proximal biliary dilation. ? ?Past Medical History:  ?Diagnosis Date  ? Bipolar disorder ([place])   ? Chronic back pain   ? COPD (chronic obstructive pulmonary disease) ([place])   ? Dr. Laurann Montana  ? Diverticulosis   ? ED (erectile dysfunction)   ? Hiatal hernia   ? Hyperlipidemia   ? Hypertension   ? ? ?Past Surgical History:  ?Procedure Laterality Date  ? BALLOON DILATION N/A 10/01/2015  ? Procedure: BALLOON DILATION;  Surgeon: Garlan Fair, MD;  Location: [facility];  Service: Endoscopy;  Laterality: N/A;  ? CATARACT EXTRACTION, BILATERAL    ? COLONOSCOPY    ? removed polyps  ? COLONOSCOPY WITH PROPOFOL N/A 09/01/2016  ? Procedure: COLONOSCOPY WITH PROPOFOL;  Surgeon: Garlan Fair, MD;  Location: [facility];  Service: Endoscopy;  Laterality: N/A;  ? ESOPHAGOGASTRODUODENOSCOPY (EGD) WITH PROPOFOL N/A 10/01/2015  ? Procedure: ESOPHAGOGASTRODUODENOSCOPY (EGD) WITH PROPOFOL;  Surgeon: Garlan Fair, MD;  Location: [facility];  Service: Endoscopy;  Laterality: N/A;  ? KNEE ARTHROSCOPY    ? TONSILLECTOMY    ? ? ?Prior to Admission medications   ?Medication Sig Start Date End Date Taking? Authorizing Provider  ?amLODipine (NORVASC) 5 MG tablet Take 5 mg by mouth daily. 03/31/22  Yes [provider]  ?aspirin EC 81 MG tablet Take 81 mg by mouth daily. Swallow whole.   Yes [provider]  ?atorvastatin (LIPITOR) 40 MG tablet Take 40 mg by mouth daily.   Yes [provider]   ?metFORMIN (GLUCOPHAGE-XR) 500 MG 24 hr tablet Take 500-1,000 mg by mouth See admin instructions. Take 2 tablets (1000 mg) in the morning and 1 tablet (500 mg) at night 03/17/22  Yes [provider]  ?Multiple Vitamin (MULTIVITAMIN) tablet Take 1 tablet by mouth daily.   Yes [provider]  ?pantoprazole (PROTONIX) 40 MG tablet Take 40 mg by mouth daily.   Yes [provider]  ?tadalafil (CIALIS) 20 MG tablet Take 20 mg by mouth every three (3) days as needed for erectile dysfunction. 03/29/22  Yes [provider]  ?telmisartan (MICARDIS) 80 MG tablet Take 80 mg by mouth daily. 02/23/22  Yes [provider]  ? ? ?Scheduled Meds: ? [START ON 04/26/2022] atorvastatin  40 mg Oral Daily  ? insulin aspart  0-9 Units Subcutaneous TID WC  ? pantoprazole (PROTONIX) IV  40 mg Intravenous Q24H  ? ?Continuous Infusions: ? lactated ringers 100 mL/hr at 04/25/22 1601  ? piperacillin-tazobactam (ZOSYN)  IV    ? ?PRN Meds:.acetaminophen **OR** acetaminophen, HYDROmorphone (DILAUDID) injection, morphine injection, ondansetron **OR** ondansetron (ZOFRAN) IV ? ?Allergies as of 04/25/2022  ? (No Known Allergies)  ? ? ?Family History  ?Problem Relation Age of Onset  ? Heart attack Mother   ? Cancer - Colon Mother   ? CAD Mother   ? Cancer Mother   ? Dementia Mother   ? CAD Father   ? Cancer Father   ? CVA Father   ? Dementia  Father   ? Heart attack Brother   ? CAD Brother   ? ? ?Social History  ? ?Socioeconomic History  ? Marital status: Married  ?  Spouse name: Not on file  ? Number of children: Not on file  ? Years of education: Not on file  ? Highest education level: Not on file  ?Occupational History  ? Not on file  ?Tobacco Use  ? Smoking status: Former  ? Smokeless tobacco: Never  ?Substance and Sexual Activity  ? Alcohol use: No  ? Drug use: No  ? Sexual activity: Not on file  ?Other Topics Concern  ? Not on file  ?Social History Narrative  ? Not on file  ? ?Social Determinants of Health   ? ?Financial Resource Strain: Not on file  ?Food Insecurity: Not on file  ?Transportation Needs: Not on file  ?Physical Activity: Not on file  ?Stress: Not on file  ?Social Connections: Not on file  ?Intimate Partner Violence: Not on file  ? ? ?Review of Systems: All negative except as stated above in HPI. ? ?Physical Exam: ?Vital signs: ?Vitals:  ? 04/25/22 1500 04/25/22 1530  ?BP: 110/73 125/72  ?Pulse: 100 97  ?Resp: (!) 24 (!) 21  ?Temp:    ?SpO2: 95% 92%  ?T 98.7 ?  ?General:   Lethargic, chronically ill-appearing, elderly, well-nourished, no acute distress  ?Head: normocephalic, atraumatic ?Eyes: anicteric sclera ?ENT: oropharynx clear ?Neck: supple, nontender ?Lungs:  Clear throughout to auscultation.   No wheezes, crackles, or rhonchi. No acute distress. ?Heart:  Regular rate and rhythm; no murmurs, clicks, rubs,  or gallops. ?Abdomen: diffuse tenderness (greatest in epigastric and RUQ) with guarding, mild distention, +BS  ?Rectal:  Deferred ?Ext: no edema ? ?GI:  ?Lab Results: ?Recent Labs  ?  04/25/22 ?0630  ?WBC 27.9*  ?HGB 14.9  ?HCT 44.9  ?PLT 251  ? ?BMET ?Recent Labs  ?  04/25/22 ?0954  ?NA 135  ?K 4.2  ?CL 100  ?CO2 22  ?GLUCOSE 197*  ?BUN 15  ?CREATININE 1.02  ?CALCIUM 9.7  ? ?LFT ?Recent Labs  ?  04/25/22 ?0954  ?PROT 7.5  ?ALBUMIN 4.0  ?AST 23  ?ALT 21  ?ALKPHOS 64  ?BILITOT 1.1  ? ?PT/INR ?No results for input(s): LABPROT, INR in the last 72 hours. ? ? ?Studies/Results: ?CT ABDOMEN PELVIS W CONTRAST ? ?Result Date: 04/25/2022 ?CLINICAL DATA:  Abdominal pain, acute, nonlocalized EXAM: CT ABDOMEN AND PELVIS WITH CONTRAST TECHNIQUE: Multidetector CT imaging of the abdomen and pelvis was performed using the standard protocol following bolus administration of intravenous contrast. RADIATION DOSE REDUCTION: This exam was performed according to the departmental dose-optimization program which includes automated exposure control, adjustment of the mA and/or kV according to patient size and/or use of  iterative reconstruction technique. CONTRAST:  178m OMNIPAQUE IOHEXOL 300 MG/ML  SOLN COMPARISON:  None. FINDINGS: Lower chest: Bibasilar atelectasis. Hepatobiliary: No focal liver abnormality. 1.3 cm gallstone at the neck. Mild pericholecystic fat infiltration. 5 mm stone within the mid common bile duct. There is mild proximal biliary dilatation. Pancreas: Unremarkable. Spleen: Unremarkable. Adrenals/Urinary Tract: Adrenals are unremarkable. No hydronephrosis or renal calculi. Circumferential bladder wall thickening probably due to chronic outlet obstruction. Stomach/Bowel: Stomach is within normal limits. Bowel is normal in caliber. A duodenal diverticulum is noted. Moderate stool throughout the colon. Vascular/Lymphatic: Atherosclerosis. 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 Reproductive: Enlarged prostate. Other: No free  fluid.  No acute abnormality of the abdominal wall. Musculoskeletal: Degenerative changes of the included spine. IMPRESSION: 5 mm stone within the mid common bile duct with mild proximal biliary dilatation. There is also a 1.3 cm gallstone at the neck with mild pericholecystic fat infiltration raising the possibility of acute cholecystitis. 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 This may represent a small increase since the 2021 ultrasound. Recommend follow-up every 12 months and vascular consultation. 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 Electronically Signed   By: Macy Mis M.D.   On: 04/25/2022 12:31  ? ?DG Chest Portable 1 View ? ?Result Date: 04/25/2022 ?CLINICAL DATA:  Generalized weakness, RIGHT upper quadrant pain EXAM: PORTABLE CHEST 1 VIEW COMPARISON:  Portable exam 1119 hours compared to 10/02/2015 FINDINGS: Normal heart size, mediastinal contours, and pulmonary vascularity. Bibasilar  atelectasis. Lungs otherwise clear. No infiltrate, pleural effusion, or pneumothorax. IMPRESSION: Bibasilar atelectasis. Electronically Signed   By: Lavonia Dana M.D.   On: 04/25/2022 11:53   ? ?Impression/Plan: ?

## 2022-04-25 NOTE — Consult Note (Signed)
? ?Consulting Physician: Nickola Major Louiza Moor ? ?Referring Provider: Dr. Gilford Raid - ER provider ? ?Chief Complaint: Abdominal pain ? ?Reason for Consult: Cholecystitis ? ? ?Subjective  ? ?HPI: ?Douglas Hurst. is an 81 y.o. male who is here for abdominal pain. ? ?Pain started 3 days ago.  Last night he keeled over and was unable to get up due to the pain so he was brought into the ER.  He has some nausea, no vomiting associated with the pain. The pain is in the right upper abdomen and is severe.  He's never had pain like this before.  He doesn't remember eating anything or doing anything that could have caused this. ? ?Past Medical History:  ?Diagnosis Date  ? Bipolar disorder ([place])   ? Chronic back pain   ? COPD (chronic obstructive pulmonary disease) ([place])   ? Dr. Laurann Montana  ? Diverticulosis   ? ED (erectile dysfunction)   ? Hiatal hernia   ? Hyperlipidemia   ? Hypertension   ? ? ?Past Surgical History:  ?Procedure Laterality Date  ? BALLOON DILATION N/A 10/01/2015  ? Procedure: BALLOON DILATION;  Surgeon: Garlan Fair, MD;  Location: [facility];  Service: Endoscopy;  Laterality: N/A;  ? CATARACT EXTRACTION, BILATERAL    ? COLONOSCOPY    ? removed polyps  ? COLONOSCOPY WITH PROPOFOL N/A 09/01/2016  ? Procedure: COLONOSCOPY WITH PROPOFOL;  Surgeon: Garlan Fair, MD;  Location: [facility];  Service: Endoscopy;  Laterality: N/A;  ? ESOPHAGOGASTRODUODENOSCOPY (EGD) WITH PROPOFOL N/A 10/01/2015  ? Procedure: ESOPHAGOGASTRODUODENOSCOPY (EGD) WITH PROPOFOL;  Surgeon: Garlan Fair, MD;  Location: [facility];  Service: Endoscopy;  Laterality: N/A;  ? KNEE ARTHROSCOPY    ? TONSILLECTOMY    ? ? ?Family History  ?Problem Relation Age of Onset  ? Heart attack Mother   ? Cancer - Colon Mother   ? CAD Mother   ? Cancer Mother   ? Dementia Mother   ? CAD Father   ? Cancer Father   ? CVA Father   ? Dementia Father   ? Heart attack Brother   ? CAD Brother   ? ? ?Social:  reports that he has quit smoking. He has never  used smokeless tobacco. He reports that he does not drink alcohol and does not use drugs. ? ?Allergies: No Known Allergies ? ?Medications: ?Current Outpatient Medications  ?Medication Instructions  ? amLODipine (NORVASC) 5 mg, Oral, Daily  ? aspirin EC 81 mg, Oral, Daily, Swallow whole.  ? atorvastatin (LIPITOR) 40 mg, Oral, Daily  ? metFORMIN (GLUCOPHAGE-XR) 500-1,000 mg, Oral, See admin instructions, Take 2 tablets (1000 mg) in the morning and 1 tablet (500 mg) at night  ? Multiple Vitamin (MULTIVITAMIN) tablet 1 tablet, Oral, Daily  ? pantoprazole (PROTONIX) 40 mg, Oral, Daily  ? tadalafil (CIALIS) 20 mg, Oral, Every 3 DAYS PRN  ? telmisartan (MICARDIS) 80 mg, Oral, Daily  ? ? ?ROS - all of the below systems have been reviewed with the patient and positives are indicated with bold text ?General: chills, fever or night sweats ?Eyes: blurry vision or double vision ?ENT: epistaxis or sore throat ?Allergy/Immunology: itchy/watery eyes or nasal congestion ?Hematologic/Lymphatic: bleeding problems, blood clots or swollen lymph nodes ?Endocrine: temperature intolerance or unexpected weight changes ?Breast: new or changing breast lumps or nipple discharge ?Resp: cough, shortness of breath, or wheezing ?CV: chest pain or dyspnea on exertion ?GI: as per HPI ?GU: dysuria, trouble voiding, or hematuria ?MSK: joint pain or joint stiffness ?  Neuro: TIA or stroke symptoms ?Derm: pruritus and skin lesion changes ?Psych: anxiety and depression ? ?Objective  ? ?PE ?Blood pressure 125/72, pulse 97, temperature 98.7 ?F (37.1 ?C), resp. rate (!) 21, SpO2 92 %. ?Constitutional: NAD; conversant; no deformities ?Eyes: Moist conjunctiva; no lid lag; anicteric; PERRL ?Neck: Trachea midline; no thyromegaly ?Lungs: Normal respiratory effort; no tactile fremitus ?CV: RRR; no palpable thrills; no pitting edema ?GI: Abd Severe right upper quadrant tenderness; no palpable hepatosplenomegaly ?MSK: Normal range of motion of extremities; no  clubbing/cyanosis ?Psychiatric: Appropriate affect; alert and oriented x3 ?Lymphatic: No palpable cervical or axillary lymphadenopathy ? ?Results for orders placed or performed during the hospital encounter of 04/25/22 (from the past 24 hour(s))  ?CBC     Status: Abnormal  ? Collection Time: 04/25/22  9:54 AM  ?Result Value Ref Range  ? WBC 27.9 (H) 4.0 - 10.5 K/uL  ? RBC 4.83 4.22 - 5.81 MIL/uL  ? Hemoglobin 14.9 13.0 - 17.0 g/dL  ? HCT 44.9 39.0 - 52.0 %  ? MCV 93.0 80.0 - 100.0 fL  ? MCH 30.8 26.0 - 34.0 pg  ? MCHC 33.2 30.0 - 36.0 g/dL  ? RDW 14.0 11.5 - 15.5 %  ? Platelets 251 150 - 400 K/uL  ? nRBC 0.0 0.0 - 0.2 %  ?Lipase, blood     Status: None  ? Collection Time: 04/25/22  9:54 AM  ?Result Value Ref Range  ? Lipase 25 11 - 51 U/L  ?Comprehensive metabolic panel     Status: Abnormal  ? Collection Time: 04/25/22  9:54 AM  ?Result Value Ref Range  ? Sodium 135 135 - 145 mmol/L  ? Potassium 4.2 3.5 - 5.1 mmol/L  ? Chloride 100 98 - 111 mmol/L  ? CO2 22 22 - 32 mmol/L  ? Glucose, Bld 197 (H) 70 - 99 mg/dL  ? BUN 15 8 - 23 mg/dL  ? Creatinine, Ser 1.02 0.61 - 1.24 mg/dL  ? Calcium 9.7 8.9 - 10.3 mg/dL  ? Total Protein 7.5 6.5 - 8.1 g/dL  ? Albumin 4.0 3.5 - 5.0 g/dL  ? AST 23 15 - 41 U/L  ? ALT 21 0 - 44 U/L  ? Alkaline Phosphatase 64 38 - 126 U/L  ? Total Bilirubin 1.1 0.3 - 1.2 mg/dL  ? GFR, Estimated >60 >60 mL/min  ? Anion gap 13 5 - 15  ?Urinalysis, Routine w reflex microscopic     Status: Abnormal  ? Collection Time: 04/25/22 12:32 PM  ?Result Value Ref Range  ? Color, Urine YELLOW YELLOW  ? APPearance CLEAR CLEAR  ? Specific Gravity, Urine <1.005 (L) 1.005 - 1.030  ? pH 6.0 5.0 - 8.0  ? Glucose, UA 100 (A) NEGATIVE mg/dL  ? Hgb urine dipstick TRACE (A) NEGATIVE  ? Bilirubin Urine NEGATIVE NEGATIVE  ? Ketones, ur NEGATIVE NEGATIVE mg/dL  ? Protein, ur NEGATIVE NEGATIVE mg/dL  ? Nitrite POSITIVE (A) NEGATIVE  ? Leukocytes,Ua SMALL (A) NEGATIVE  ?Urinalysis, Microscopic (reflex)     Status: None  ? Collection  Time: 04/25/22 12:32 PM  ?Result Value Ref Range  ? RBC / HPF 0-5 0 - 5 RBC/hpf  ? WBC, UA 0-5 0 - 5 WBC/hpf  ? Bacteria, UA NONE SEEN NONE SEEN  ? Squamous Epithelial / LPF 0-5 0 - 5  ? ? ? ?Imaging Orders    ?     CT ABDOMEN PELVIS W CONTRAST    ?5 mm stone within the mid common bile  duct with mild proximal ?biliary dilatation. There is also a 1.3 cm gallstone at the neck ?with mild pericholecystic fat infiltration raising the possibility ?of acute cholecystitis. ?  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 This may ?represent a small increase since the 2021 ultrasound. Recommend ?follow-up every 12 months and vascular consultation. ?  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 ? ? ?     DG Chest Portable 1 View    ? ? ?Assessment and Plan  ? ?Douglas Hurst. is an 81 y.o. male with severe abdominal pain found to have cholecystitis with a white blood cell count of 27.9 concerning for gangrenous cholecystitis.  Interestingly on CT he also has choledocholithiasis, but with normal LFTs it seems he is in pain due to severe cholecystitis and not ascending cholangitis.  It would be preferable to have the duct cleared prior to cholecystectomy to reduce the risk of postoperative bile leak.  It also would not be good if he developed cholangitis perioperatively.  I discussed the case with Dr. Michail Sermon (GI) and Dr. Maryelizabeth Kaufmann (Interventional radiology).  We will plan for percutaneous cholecystostomy tube, follow LFTs and possibly perform ERCP and eventually plan on cholecystectomy which may be as an outpatient in follow up.  He should be kept NPO for interventional radiology.  He should continue antibiotics for severe cholecystitis and cholangitis prophylaxis.  Surgery team will follow. ? ? ? ?  ICD-10-CM   ?1. Cholecystitis  K81.9   ?  ? ? ? ?Felicie Morn, MD ? ?[facility] ?Use [email or web address] to contact on call provider ? ?New  Patient Billing: ?[phone] - High MDM ?

## 2022-04-25 NOTE — Assessment & Plan Note (Addendum)
-  Continue Pantoprazole 40 mg IV q24h but likely can change to po and resume Home regimen for discharge ?

## 2022-04-26 ENCOUNTER — Observation Stay (HOSPITAL_COMMUNITY): Payer: Medicare HMO

## 2022-04-26 ENCOUNTER — Inpatient Hospital Stay (HOSPITAL_COMMUNITY): Payer: Medicare HMO

## 2022-04-26 DIAGNOSIS — I7143 Infrarenal abdominal aortic aneurysm, without rupture: Secondary | ICD-10-CM | POA: Diagnosis present

## 2022-04-26 DIAGNOSIS — E1142 Type 2 diabetes mellitus with diabetic polyneuropathy: Secondary | ICD-10-CM

## 2022-04-26 DIAGNOSIS — K2100 Gastro-esophageal reflux disease with esophagitis, without bleeding: Secondary | ICD-10-CM | POA: Diagnosis not present

## 2022-04-26 DIAGNOSIS — E782 Mixed hyperlipidemia: Secondary | ICD-10-CM | POA: Diagnosis present

## 2022-04-26 DIAGNOSIS — R0902 Hypoxemia: Secondary | ICD-10-CM | POA: Diagnosis not present

## 2022-04-26 DIAGNOSIS — Z8249 Family history of ischemic heart disease and other diseases of the circulatory system: Secondary | ICD-10-CM | POA: Diagnosis not present

## 2022-04-26 DIAGNOSIS — Z7982 Long term (current) use of aspirin: Secondary | ICD-10-CM | POA: Diagnosis not present

## 2022-04-26 DIAGNOSIS — R9431 Abnormal electrocardiogram [ECG] [EKG]: Secondary | ICD-10-CM | POA: Diagnosis not present

## 2022-04-26 DIAGNOSIS — I10 Essential (primary) hypertension: Secondary | ICD-10-CM | POA: Diagnosis not present

## 2022-04-26 DIAGNOSIS — Z87891 Personal history of nicotine dependence: Secondary | ICD-10-CM | POA: Diagnosis not present

## 2022-04-26 DIAGNOSIS — K8000 Calculus of gallbladder with acute cholecystitis without obstruction: Secondary | ICD-10-CM | POA: Diagnosis not present

## 2022-04-26 DIAGNOSIS — I110 Hypertensive heart disease with heart failure: Secondary | ICD-10-CM | POA: Diagnosis present

## 2022-04-26 DIAGNOSIS — Z7984 Long term (current) use of oral hypoglycemic drugs: Secondary | ICD-10-CM | POA: Diagnosis not present

## 2022-04-26 DIAGNOSIS — K8042 Calculus of bile duct with acute cholecystitis without obstruction: Secondary | ICD-10-CM | POA: Diagnosis not present

## 2022-04-26 DIAGNOSIS — Z809 Family history of malignant neoplasm, unspecified: Secondary | ICD-10-CM | POA: Diagnosis not present

## 2022-04-26 DIAGNOSIS — D649 Anemia, unspecified: Secondary | ICD-10-CM | POA: Diagnosis not present

## 2022-04-26 DIAGNOSIS — R1084 Generalized abdominal pain: Secondary | ICD-10-CM | POA: Diagnosis not present

## 2022-04-26 DIAGNOSIS — I5021 Acute systolic (congestive) heart failure: Secondary | ICD-10-CM | POA: Diagnosis present

## 2022-04-26 DIAGNOSIS — K5790 Diverticulosis of intestine, part unspecified, without perforation or abscess without bleeding: Secondary | ICD-10-CM | POA: Diagnosis present

## 2022-04-26 DIAGNOSIS — I959 Hypotension, unspecified: Secondary | ICD-10-CM | POA: Diagnosis not present

## 2022-04-26 DIAGNOSIS — I5041 Acute combined systolic (congestive) and diastolic (congestive) heart failure: Secondary | ICD-10-CM | POA: Diagnosis not present

## 2022-04-26 DIAGNOSIS — K8062 Calculus of gallbladder and bile duct with acute cholecystitis without obstruction: Secondary | ICD-10-CM | POA: Diagnosis present

## 2022-04-26 DIAGNOSIS — G8929 Other chronic pain: Secondary | ICD-10-CM | POA: Diagnosis present

## 2022-04-26 DIAGNOSIS — K219 Gastro-esophageal reflux disease without esophagitis: Secondary | ICD-10-CM | POA: Diagnosis present

## 2022-04-26 DIAGNOSIS — M549 Dorsalgia, unspecified: Secondary | ICD-10-CM | POA: Diagnosis present

## 2022-04-26 DIAGNOSIS — I714 Abdominal aortic aneurysm, without rupture, unspecified: Secondary | ICD-10-CM

## 2022-04-26 DIAGNOSIS — J449 Chronic obstructive pulmonary disease, unspecified: Secondary | ICD-10-CM | POA: Diagnosis present

## 2022-04-26 DIAGNOSIS — I480 Paroxysmal atrial fibrillation: Secondary | ICD-10-CM

## 2022-04-26 DIAGNOSIS — Z79899 Other long term (current) drug therapy: Secondary | ICD-10-CM | POA: Diagnosis not present

## 2022-04-26 DIAGNOSIS — K810 Acute cholecystitis: Secondary | ICD-10-CM | POA: Diagnosis not present

## 2022-04-26 DIAGNOSIS — A419 Sepsis, unspecified organism: Secondary | ICD-10-CM

## 2022-04-26 DIAGNOSIS — F319 Bipolar disorder, unspecified: Secondary | ICD-10-CM | POA: Diagnosis present

## 2022-04-26 DIAGNOSIS — A4151 Sepsis due to Escherichia coli [E. coli]: Secondary | ICD-10-CM | POA: Diagnosis present

## 2022-04-26 DIAGNOSIS — K8050 Calculus of bile duct without cholangitis or cholecystitis without obstruction: Secondary | ICD-10-CM | POA: Diagnosis not present

## 2022-04-26 DIAGNOSIS — R1011 Right upper quadrant pain: Secondary | ICD-10-CM | POA: Diagnosis present

## 2022-04-26 DIAGNOSIS — I4892 Unspecified atrial flutter: Secondary | ICD-10-CM | POA: Diagnosis not present

## 2022-04-26 DIAGNOSIS — Z823 Family history of stroke: Secondary | ICD-10-CM | POA: Diagnosis not present

## 2022-04-26 HISTORY — PX: IR PERC CHOLECYSTOSTOMY: IMG2326

## 2022-04-26 LAB — COMPREHENSIVE METABOLIC PANEL
ALT: 21 U/L (ref 0–44)
AST: 21 U/L (ref 15–41)
Albumin: 3.2 g/dL — ABNORMAL LOW (ref 3.5–5.0)
Alkaline Phosphatase: 46 U/L (ref 38–126)
Anion gap: 9 (ref 5–15)
BUN: 16 mg/dL (ref 8–23)
CO2: 21 mmol/L — ABNORMAL LOW (ref 22–32)
Calcium: 9 mg/dL (ref 8.9–10.3)
Chloride: 105 mmol/L (ref 98–111)
Creatinine, Ser: 0.94 mg/dL (ref 0.61–1.24)
GFR, Estimated: >60 mL/min (ref >60)
Glucose, Bld: 157 mg/dL — ABNORMAL HIGH (ref 70–99)
Potassium: 4.2 mmol/L (ref 3.5–5.1)
Sodium: 135 mmol/L (ref 135–145)
Total Bilirubin: 1.2 mg/dL (ref 0.3–1.2)
Total Protein: 6.4 g/dL — ABNORMAL LOW (ref 6.5–8.1)

## 2022-04-26 LAB — CBC
HCT: 41.3 % (ref 39.0–52.0)
Hemoglobin: 14.1 g/dL (ref 13.0–17.0)
MCH: 31.3 pg (ref 26.0–34.0)
MCHC: 34.1 g/dL (ref 30.0–36.0)
MCV: 91.6 fL (ref 80.0–100.0)
Platelets: 193 10*3/uL (ref 150–400)
RBC: 4.51 MIL/uL (ref 4.22–5.81)
RDW: 14.4 % (ref 11.5–15.5)
WBC: 27.9 10*3/uL — ABNORMAL HIGH (ref 4.0–10.5)
nRBC: 0 % (ref 0.0–0.2)

## 2022-04-26 LAB — BLOOD CULTURE ID PANEL (REFLEXED) - BCID2

## 2022-04-26 LAB — TROPONIN I (HIGH SENSITIVITY)
Troponin I (High Sensitivity): 24 ng/L — ABNORMAL HIGH (ref <18)
Troponin I (High Sensitivity): 26 ng/L — ABNORMAL HIGH (ref <18)

## 2022-04-26 LAB — ECHOCARDIOGRAM COMPLETE
AR max vel: 2.63 cm2
AV Area VTI: 2.8 cm2
AV Area mean vel: 2.72 cm2
AV Mean grad: 4 mmHg
AV Peak grad: 7.4 mmHg
Ao pk vel: 1.36 m/s
Area-P 1/2: 4.1 cm2
Calc EF: 43.1 %
Height: 74 in
MV VTI: 2.44 cm2
S' Lateral: 3.9 cm
Single Plane A2C EF: 56.5 %
Single Plane A4C EF: 31.5 %
Weight: 2991.2 oz

## 2022-04-26 LAB — GLUCOSE, CAPILLARY
Glucose-Capillary: 146 mg/dL — ABNORMAL HIGH (ref 70–99)
Glucose-Capillary: 147 mg/dL — ABNORMAL HIGH (ref 70–99)
Glucose-Capillary: 152 mg/dL — ABNORMAL HIGH (ref 70–99)
Glucose-Capillary: 165 mg/dL — ABNORMAL HIGH (ref 70–99)
Glucose-Capillary: 171 mg/dL — ABNORMAL HIGH (ref 70–99)
Glucose-Capillary: 179 mg/dL — ABNORMAL HIGH (ref 70–99)
Glucose-Capillary: 180 mg/dL — ABNORMAL HIGH (ref 70–99)

## 2022-04-26 LAB — PROTIME-INR
INR: 1.3 — ABNORMAL HIGH (ref 0.8–1.2)
Prothrombin Time: 16 seconds — ABNORMAL HIGH (ref 11.4–15.2)

## 2022-04-26 MED ORDER — DILTIAZEM HCL-DEXTROSE 125-5 MG/125ML-% IV SOLN (PREMIX)
5.0000 mg/h | INTRAVENOUS | Status: DC
  Administered 2022-04-26: 5 mg/h via INTRAVENOUS
  Filled 2022-04-26: qty 125

## 2022-04-26 MED ORDER — HYDROMORPHONE HCL 1 MG/ML IJ SOLN
0.5000 mg | INTRAMUSCULAR | Status: DC | PRN
  Administered 2022-04-27 (×3): 1 mg via INTRAVENOUS
  Filled 2022-04-26 (×3): qty 1

## 2022-04-26 MED ORDER — NITROGLYCERIN 0.4 MG SL SUBL
SUBLINGUAL_TABLET | SUBLINGUAL | Status: AC
  Administered 2022-04-26: 0.4 mg via ORAL
  Filled 2022-04-26: qty 1

## 2022-04-26 MED ORDER — IOHEXOL 300 MG/ML  SOLN
100.0000 mL | Freq: Once | INTRAMUSCULAR | Status: AC | PRN
  Administered 2022-04-26: 5 mL

## 2022-04-26 MED ORDER — LIDOCAINE HCL 1 % IJ SOLN
INTRAMUSCULAR | Status: AC | PRN
  Administered 2022-04-26: 10 mL

## 2022-04-26 MED ORDER — LACTATED RINGERS IV BOLUS
500.0000 mL | Freq: Once | INTRAVENOUS | Status: AC
  Administered 2022-04-26: 500 mL via INTRAVENOUS

## 2022-04-26 MED ORDER — FENTANYL CITRATE (PF) 100 MCG/2ML IJ SOLN
INTRAMUSCULAR | Status: AC | PRN
  Administered 2022-04-26: 25 ug via INTRAVENOUS

## 2022-04-26 MED ORDER — DILTIAZEM LOAD VIA INFUSION
10.0000 mg | Freq: Once | INTRAVENOUS | Status: AC
  Administered 2022-04-26: 10 mg via INTRAVENOUS
  Filled 2022-04-26: qty 10

## 2022-04-26 MED ORDER — NITROGLYCERIN 0.4 MG SL SUBL: 0.4000 mg | SUBLINGUAL_TABLET | SUBLINGUAL | Status: DC | PRN

## 2022-04-26 MED ORDER — VANCOMYCIN HCL IN DEXTROSE 1-5 GM/200ML-% IV SOLN
1000.0000 mg | Freq: Two times a day (BID) | INTRAVENOUS | Status: DC
Start: 2022-04-26 — End: 2022-04-27
  Administered 2022-04-26 – 2022-04-27 (×2): 1000 mg via INTRAVENOUS
  Filled 2022-04-26 (×4): qty 200

## 2022-04-26 MED ORDER — LIDOCAINE HCL 1 % IJ SOLN
INTRAMUSCULAR | Status: AC
  Filled 2022-04-26: qty 20

## 2022-04-26 MED ORDER — ALUM & MAG HYDROXIDE-SIMETH 200-200-20 MG/5ML PO SUSP: 30.0000 mL | ORAL | Status: DC | PRN

## 2022-04-26 MED ORDER — MIDAZOLAM HCL 2 MG/2ML IJ SOLN
INTRAMUSCULAR | Status: AC
  Filled 2022-04-26: qty 2

## 2022-04-26 MED ORDER — FENTANYL CITRATE (PF) 100 MCG/2ML IJ SOLN
INTRAMUSCULAR | Status: AC
  Filled 2022-04-26: qty 2

## 2022-04-26 MED ORDER — METOPROLOL TARTRATE 5 MG/5ML IV SOLN
5.0000 mg | INTRAVENOUS | Status: AC
  Administered 2022-04-26: 5 mg via INTRAVENOUS
  Filled 2022-04-26: qty 5

## 2022-04-26 MED ORDER — SODIUM CHLORIDE 0.9 % IV BOLUS: 500.0000 mL | Freq: Once | INTRAVENOUS | Status: DC

## 2022-04-26 MED ORDER — SODIUM CHLORIDE 0.9% FLUSH
5.0000 mL | Freq: Three times a day (TID) | INTRAVENOUS | Status: DC
  Administered 2022-04-26 – 2022-04-30 (×12): 5 mL

## 2022-04-26 MED ORDER — METOPROLOL TARTRATE 5 MG/5ML IV SOLN
5.0000 mg | INTRAVENOUS | Status: DC | PRN
  Administered 2022-04-27 (×2): 5 mg via INTRAVENOUS
  Filled 2022-04-26 (×2): qty 5

## 2022-04-26 NOTE — Sedation Documentation (Signed)
Cardizem drip off at this time, patient become more hypotensive at this time. Floor RN notified. LR bolus at this time infusing  ?

## 2022-04-26 NOTE — Progress Notes (Signed)
[facility] Progress Note ? ?[name] 81 y.o. 10/29/1941 ? ? ?Subjective: ?Just came back from IR and very tremulous. Nurse at bedside changing IV. Wife in room. ? ?Objective: ?Vital signs: ?Vitals:  ? 04/26/22 1045 04/26/22 1050  ?BP: (!) 80/54 118/67  ?Pulse: (!) 136 (!) 129  ?Resp: 19 (!) 32  ?Temp:    ?SpO2: 99% 97%  ?T 97.7 ? ?Physical Exam: ?Gen: elderly, tremulous, well-nourished, moderate acute distress  ?HEENT: anicteric sclera ?CV: RRR ?Chest: CTA B ?Abd: diffuse tenderness with guarding, soft, nondistended, +BS, drain site dressing in place ?Ext: no edema ? ?Lab Results: ?Recent Labs  ?  04/25/22 ?6213 04/26/22 ?0103  ?NA 135 135  ?K 4.2 4.2  ?CL 100 105  ?CO2 22 21*  ?GLUCOSE 197* 157*  ?BUN 15 16  ?CREATININE 1.02 0.94  ?CALCIUM 9.7 9.0  ? ?Recent Labs  ?  04/25/22 ?0954 04/26/22 ?0103  ?AST 23 21  ?ALT 21 21  ?ALKPHOS 64 46  ?BILITOT 1.1 1.2  ?PROT 7.5 6.4*  ?ALBUMIN 4.0 3.2*  ? ?Recent Labs  ?  04/25/22 ?0954 04/26/22 ?0103  ?WBC 27.9* 27.9*  ?HGB 14.9 14.1  ?HCT 44.9 41.3  ?MCV 93.0 91.6  ?PLT 251 193  ? ? ? ? ?Assessment/Plan: ?Acute cholecystitis with nonobstructing CBD stone and normal LFTs. S/P cholecystostomy tube placement today. AFib with RVR being managed by primary team. Will discuss with my biliary partners tomorrow for timing of ERCP but with normal LFTs no urgency to do right now and will postpone until medically stable and until GB inflammation clinically improves with cholecystostomy tube. Will f/u. ? ? ?Lear Ng ?04/26/2022, 12:53 PM ? ?Questions please call [phone] Patient ID: Sultan Pargas., male   DOB: 22-Mar-1941, 81 y.o.   MRN: [id number] ? ?

## 2022-04-26 NOTE — Significant Event (Signed)
Rapid Response Event Note  ? ?Reason for Call :  ?Tachycardia. Per RN, pt had just stood up at bedside to void. ? ?He became tachycardia-150s and began to c/o 10/10 chest pain ? ?Initial Focused Assessment:  ?Pt lying in bed with eyes closed, his hand is holding his chest. Pt c/o 10/10 mid sternal chest pain/pressure. Lungs clear and diminished. Skin warm and dry.  ? ?Pt says he has been having lower chest/upper abd pain since admission however this pain is different.  ? ?HR-150-170s, BP-113/67, RR-20, SpO2-91% on RA.  ? ? ?Interventions:  ?EKG-Afib RVR ?'2mg'$  morphine given IV ?500cc LR ?Trop ?Ntg SL 0.'4mg'$  x 1 ?Plan of Care:  ?SBP dropped to 89 after '2mg'$  morphine and 0.'4mg'$  ntg sl, HR-150s.  500cc LR bolus infusing. Chest pain down to 5/10. Pt looks comfortable in bed. Await lab results. Pt may need more fluids. Continue to monitor pt closely. Call RRT if further assistance needed.  ? ?Event Summary:  ? ?MD Notified: Lennox Grumbles notified by bedside RN ?[facility] [place] ?[place] ?End Time:0155 ? ?Dillard Essex, RN ?

## 2022-04-26 NOTE — Procedures (Signed)
Vascular and Interventional Radiology Procedure Note ? ?Patient: Douglas Hurst. ?DOB: 05-18-1941 ?Medical Record Number: [id number] ?Note Date/Time: 04/26/22 10:31 AM  ? ?Performing Physician: Michaelle Birks, MD ?Assistant(s): None ? ?Diagnosis: Acute cholecystitis. ? ?Procedure:  ?CHOLECYSTOSTOMY TUBE PLACEMENT ? ?Anesthesia: Conscious Sedation ?Complications: None ?Estimated Blood Loss: Minimal ?Specimens:  None ? ?Findings:  ?Successful placement of 57F cholecystostomy tube. ? ?Plan: Flush tube w 10 mL sterile NS and record drain output qShift. ?Follow up for routine tube evaluation in 6 week(s).  ? ?See detailed procedure note with images in PACS. ?The patient tolerated the procedure well without incident or complication and was returned to  Med Surg  in stable condition.  ? ? ?Michaelle Birks, MD ?[facility] ?[facility] ? ? ?Pager. [phone] ?Clinic. [phone]  ?

## 2022-04-26 NOTE — Consult Note (Signed)
?Cardiology Consultation:  ? ?Patient ID: Douglas Hurst. ?MRN: [id number]; DOB: 26-Feb-1941 ? ?Admit date: 04/25/2022 ?Date of Consult: 04/26/2022 ? ?Primary Care Provider: Lavone Orn, MD ?Primary Cardiologist: None  ?Primary Electrophysiologist:  None  ? ? ?Patient Profile:  ? ?Douglas Hurst. is a 81 y.o. male with a hx of COPD hyperlipidemia, hypertension, type 2 diabetes, GERD, bipolar disorder, BPH, chronic back pain who is being seen today for the evaluation of atrial fibrillation at the request of hospital medicine. ? ?History of Present Illness:  ? ?Douglas Hurst is a 81 year old male with a history of COPD, hyperlipidemia, hypertension, type 2 diabetes, GERD scented with 1 week of worsening right upper quadrant pain that progressed over the last few days.  Wife thinks that he collapsed but did not lose consciousness.  Has not had any chest pain or palpitations.  No shortness of breath, no nausea, vomiting, diarrhea, edema. ? ?After work-up in the emergency department he was admitted for sepsis that was secondary to calculus of the bile duct with acute cholecystitis.  In that setting, he has developed a new onset atrial fibrillation with rapid ventricular response.  He was given diltiazem infusion and reverted to normal sinus rhythm.  He was sent for a chest wall echo which noted an EF that was newly depressed at 40-45%.  Given the new lower EF, the hospitalist team attempted to give metoprolol for rate control which caused some hypotension. ? ?For this, cardiology was consulted for further recommendations. ? ?Past Medical History:  ?Diagnosis Date  ? Bipolar disorder ([place])   ? Chronic back pain   ? COPD (chronic obstructive pulmonary disease) ([place])   ? Dr. Laurann Montana  ? Diverticulosis   ? ED (erectile dysfunction)   ? Hiatal hernia   ? Hyperlipidemia   ? Hypertension   ? ? ?Past Surgical History:  ?Procedure Laterality Date  ? BALLOON DILATION N/A 10/01/2015  ? Procedure: BALLOON DILATION;  Surgeon: Garlan Fair, MD;  Location: [facility];  Service: Endoscopy;  Laterality: N/A;  ? CATARACT EXTRACTION, BILATERAL    ? COLONOSCOPY    ? removed polyps  ? COLONOSCOPY WITH PROPOFOL N/A 09/01/2016  ? Procedure: COLONOSCOPY WITH PROPOFOL;  Surgeon: Garlan Fair, MD;  Location: [facility];  Service: Endoscopy;  Laterality: N/A;  ? ESOPHAGOGASTRODUODENOSCOPY (EGD) WITH PROPOFOL N/A 10/01/2015  ? Procedure: ESOPHAGOGASTRODUODENOSCOPY (EGD) WITH PROPOFOL;  Surgeon: Garlan Fair, MD;  Location: [facility];  Service: Endoscopy;  Laterality: N/A;  ? IR PERC CHOLECYSTOSTOMY  04/26/2022  ? KNEE ARTHROSCOPY    ? TONSILLECTOMY    ?  ? ?Home Medications:  ?Prior to Admission medications   ?Medication Sig Start Date End Date Taking? Authorizing Provider  ?amLODipine (NORVASC) 5 MG tablet Take 5 mg by mouth daily. 03/31/22  Yes [provider]  ?aspirin EC 81 MG tablet Take 81 mg by mouth daily. Swallow whole.   Yes [provider]  ?atorvastatin (LIPITOR) 40 MG tablet Take 40 mg by mouth daily.   Yes [provider]  ?metFORMIN (GLUCOPHAGE-XR) 500 MG 24 hr tablet Take 500-1,000 mg by mouth See admin instructions. Take 2 tablets (1000 mg) in the morning and 1 tablet (500 mg) at night 03/17/22  Yes [provider]  ?Multiple Vitamin (MULTIVITAMIN) tablet Take 1 tablet by mouth daily.   Yes [provider]  ?pantoprazole (PROTONIX) 40 MG tablet Take 40 mg by mouth daily.   Yes [provider]  ?tadalafil (CIALIS) 20 MG  tablet Take 20 mg by mouth every three (3) days as needed for erectile dysfunction. 03/29/22  Yes [provider]  ?telmisartan (MICARDIS) 80 MG tablet Take 80 mg by mouth daily. 02/23/22  Yes [provider]  ? ? ?Inpatient Medications: ?Scheduled Meds: ? atorvastatin  40 mg Oral Daily  ? fentaNYL      ? insulin aspart  0-9 Units Subcutaneous TID WC  ? lidocaine      ? midazolam      ? pantoprazole (PROTONIX) IV  40 mg Intravenous Q24H  ? sodium  chloride flush  5 mL Intracatheter Q8H  ? ?Continuous Infusions: ? diltiazem (CARDIZEM) infusion 12.5 mg/hr (04/26/22 1442)  ? lactated ringers    ? lactated ringers 100 mL/hr at 04/26/22 0239  ? piperacillin-tazobactam (ZOSYN)  IV 3.375 g (04/26/22 1101)  ? ?PRN Meds: ?acetaminophen **OR** acetaminophen, alum & mag hydroxide-simeth, HYDROmorphone (DILAUDID) injection, metoprolol tartrate, nitroGLYCERIN, ondansetron **OR** ondansetron (ZOFRAN) IV ? ?Allergies:   No Known Allergies ? ?Social History:   ?Social History  ? ?Socioeconomic History  ? Marital status: Married  ?  Spouse name: Not on file  ? Number of children: Not on file  ? Years of education: Not on file  ? Highest education level: Not on file  ?Occupational History  ? Not on file  ?Tobacco Use  ? Smoking status: Former  ? Smokeless tobacco: Never  ?Substance and Sexual Activity  ? Alcohol use: No  ? Drug use: No  ? Sexual activity: Not on file  ?Other Topics Concern  ? Not on file  ?Social History Narrative  ? Not on file  ? ?Social Determinants of Health  ? ?Financial Resource Strain: Not on file  ?Food Insecurity: Not on file  ?Transportation Needs: Not on file  ?Physical Activity: Not on file  ?Stress: Not on file  ?Social Connections: Not on file  ?Intimate Partner Violence: Not on file  ?  ?Family History:   ? ?Family History  ?Problem Relation Age of Onset  ? Heart attack Mother   ? Cancer - Colon Mother   ? CAD Mother   ? Cancer Mother   ? Dementia Mother   ? CAD Father   ? Cancer Father   ? CVA Father   ? Dementia Father   ? Heart attack Brother   ? CAD Brother   ?  ? ?Review of Systems: [y] = yes, '[ ]'$  = no  ?  ?General: Weight gain '[ ]'$ ; Weight loss '[ ]'$ ; Anorexia '[ ]'$ ; Fatigue '[ ]'$ ; Fever '[ ]'$ ; Chills '[ ]'$ ; Weakness '[ ]'$   ?Cardiac: Chest pain/pressure '[ ]'$ ; Resting SOB '[ ]'$ ; Exertional SOB '[ ]'$ ; Orthopnea '[ ]'$ ; Pedal Edema '[ ]'$ ; Palpitations '[ ]'$ ; Syncope '[ ]'$ ; Presyncope '[ ]'$ ; Paroxysmal nocturnal dyspnea'[ ]'$   ?Pulmonary: Cough '[ ]'$ ; Wheezing'[ ]'$ ; Hemoptysis[  ]; Sputum '[ ]'$ ; Snoring '[ ]'$   ?GI: Vomiting'[ ]'$ ; Dysphagia'[ ]'$ ; Melena'[ ]'$ ; Hematochezia '[ ]'$ ; Heartburn'[ ]'$ ; Abdominal pain '[ ]'$ ; Constipation '[ ]'$ ; Diarrhea '[ ]'$ ; BRBPR '[ ]'$   ?GU: Hematuria'[ ]'$ ; Dysuria '[ ]'$ ; Nocturia'[ ]'$   ?Vascular: Pain in legs with walking '[ ]'$ ; Pain in feet with lying flat '[ ]'$ ; Non-healing sores '[ ]'$ ; Stroke '[ ]'$ ; TIA '[ ]'$ ; Slurred speech '[ ]'$ ;  ?Neuro: Headaches'[ ]'$ ; Vertigo'[ ]'$ ; Seizures'[ ]'$ ; Paresthesias'[ ]'$ ;Blurred vision '[ ]'$ ; Diplopia '[ ]'$ ; Vision changes '[ ]'$   ?Ortho/Skin: Arthritis '[ ]'$ ; Joint pain '[ ]'$ ; Muscle pain '[ ]'$ ; Joint swelling '[ ]'$ ; Back  Pain '[ ]'$ ; Rash '[ ]'$   ?Psych: Depression'[ ]'$ ; Anxiety'[ ]'$   ?Heme: Bleeding problems '[ ]'$ ; Clotting disorders '[ ]'$ ; Anemia '[ ]'$   ?Endocrine: Diabetes '[ ]'$ ; Thyroid dysfunction'[ ]'$  ? ?Physical Exam/Data:  ? ?Vitals:  ? 04/26/22 1442 04/26/22 1540 04/26/22 1550 04/26/22 1650  ?BP: 92/62 (!) 79/57 (!) 84/50 (!) 73/41  ?Pulse: 78 80 76 81  ?Resp: (!) 23 (!) '24 15 14  '$ ?Temp:  (!) 97.5 ?F (36.4 ?C)    ?TempSrc:  Oral    ?SpO2: 91% 92% 93% 96%  ?Weight:      ?Height:      ? ? ?Intake/Output Summary (Last 24 hours) at 04/26/2022 1707 ?Last data filed at 04/26/2022 0900 ?Gross per 24 hour  ?Intake --  ?Output 175 ml  ?Net -175 ml  ? ?Filed Weights  ? 04/25/22 1734  ?Weight: 84.8 kg  ? ?Body mass index is 24 kg/m?.  ?General:  Well nourished, well developed, in no acute distress ?HEENT: normal ?Lymph: no adenopathy ?Neck: no JVD ?Endocrine:  No thryomegaly ?Vascular: No carotid bruits; FA pulses 2+ bilaterally without bruits  ?Cardiac:  normal S1, S2; tachycardic but regular; no murmur  ?Lungs:  clear to auscultation bilaterally, no wheezing, rhonchi or rales  ?Abd: soft, nontender, no hepatomegaly  ?Ext: no edema ?Musculoskeletal:  No deformities, BUE and BLE strength normal and equal ?Skin: warm and dry  ?Neuro:  CNs 2-12 intact, no focal abnormalities noted ?Psych:  Normal affect  ? ?EKG:  The EKG was personally reviewed and demonstrates:  afib prior ?Telemetry:  Telemetry was personally  reviewed and demonstrates:  sinus tachy today on tele ? ?Relevant CV Studies: ? ?Echo 04/26/2022 ?1. Left ventricular ejection fraction, by estimation, is 40 to 45%. The  ?left ventricle has mildly decreas

## 2022-04-26 NOTE — Progress Notes (Signed)
PHARMACY - PHYSICIAN COMMUNICATION ?CRITICAL VALUE ALERT - BLOOD CULTURE IDENTIFICATION (BCID) ? ?Douglas Hurst. is an 81 y.o. male who presented to [facility] on 04/25/2022 with a chief complaint of Cholecystitis ? ?Assessment: Pt has been on zosyn for cholecystitis. Lab called with BCID result of 1/3 bottles with staph species. Prob a contaminant but Dr. Bonner Puna would like to cover with empiric vanc for now due to tenuous condition.  ? ?Name of physician (or Provider) Contacted: Dr. Bonner Puna ? ?Current antibiotics: Zosyn ? ?Changes to prescribed antibiotics recommended:  ?Add vanc 1g IV q12 ? ?Results for orders placed or performed during the hospital encounter of 04/25/22  ?Blood Culture ID Panel (Reflexed) (Collected: 04/25/2022  6:34 PM)  ?Result Value Ref Range  ? Enterococcus faecalis NOT DETECTED NOT DETECTED  ? Enterococcus Faecium NOT DETECTED NOT DETECTED  ? Listeria monocytogenes NOT DETECTED NOT DETECTED  ? Staphylococcus species DETECTED (A) NOT DETECTED  ? Staphylococcus aureus (BCID) NOT DETECTED NOT DETECTED  ? Staphylococcus epidermidis NOT DETECTED NOT DETECTED  ? Staphylococcus lugdunensis NOT DETECTED NOT DETECTED  ? Streptococcus species NOT DETECTED NOT DETECTED  ? Streptococcus agalactiae NOT DETECTED NOT DETECTED  ? Streptococcus pneumoniae NOT DETECTED NOT DETECTED  ? Streptococcus pyogenes NOT DETECTED NOT DETECTED  ? A.calcoaceticus-baumannii NOT DETECTED NOT DETECTED  ? Bacteroides fragilis NOT DETECTED NOT DETECTED  ? Enterobacterales NOT DETECTED NOT DETECTED  ? Enterobacter cloacae complex NOT DETECTED NOT DETECTED  ? Escherichia coli NOT DETECTED NOT DETECTED  ? Klebsiella aerogenes NOT DETECTED NOT DETECTED  ? Klebsiella oxytoca NOT DETECTED NOT DETECTED  ? Klebsiella pneumoniae NOT DETECTED NOT DETECTED  ? Proteus species NOT DETECTED NOT DETECTED  ? Salmonella species NOT DETECTED NOT DETECTED  ? Serratia marcescens NOT DETECTED NOT DETECTED  ? Haemophilus influenzae NOT DETECTED  NOT DETECTED  ? Neisseria meningitidis NOT DETECTED NOT DETECTED  ? Pseudomonas aeruginosa NOT DETECTED NOT DETECTED  ? Stenotrophomonas maltophilia NOT DETECTED NOT DETECTED  ? Candida albicans NOT DETECTED NOT DETECTED  ? Candida auris NOT DETECTED NOT DETECTED  ? Candida glabrata NOT DETECTED NOT DETECTED  ? Candida krusei NOT DETECTED NOT DETECTED  ? Candida parapsilosis NOT DETECTED NOT DETECTED  ? Candida tropicalis NOT DETECTED NOT DETECTED  ? Cryptococcus neoformans/gattii NOT DETECTED NOT DETECTED  ? ?Onnie Boer, PharmD, BCIDP, AAHIVP, CPP ?Infectious Disease Pharmacist ?04/26/2022 5:50 PM ? ? ? ?

## 2022-04-26 NOTE — Progress Notes (Signed)
*  PRELIMINARY RESULTS* ?Echocardiogram ?2D Echocardiogram has been performed. ? ?Douglas Hurst ?04/26/2022, 4:16 PM ?

## 2022-04-26 NOTE — Progress Notes (Signed)
?Progress Note ? ?Patient: Douglas Hurst. OBS:[id number] DOB: 1941-12-16  ?DOA: 04/25/2022  DOS: 04/26/2022  ?  ?Brief hospital course: ?Dwain Huhn. is a 81 y.o. male with a history of COPD, HTN, HLD, T2DM, BPH, GERD, AAA, chronic back pain and bipolar disorder who presented to the ED 4/29 with RUQ pain for a couple days and weakness causing fall and inability to get back up. He was afebrile with leukocytosis to 27.9k and CT suggestive of acute cholecystitis with 1.3cm gallstone in the neck of the gallbladder and 75m CBD stone with proximal biliary dilatation. He was given IVF, zosyn, and admitted for further management. General surgery and GI have recommended cholecystostomy tube for management which has been performed 4/30. He has developed AFib with RVR improved with diltiazem infusion and IV metoprolol, changed to progressive care status.  ? ?Assessment and Plan: ?Sepsis due to acute calculus cholecystitis: ?- s/p cholecystostomy 4/30 ?- Continue zosyn ?- Rebolus LR and continue gtt.  ?- Increase acuity of care to PCU, low threshold for transfer to ICU. Confirmed desire for full measures with pt and spouse this morning.  ?- Monitor blood cultures and GB culture. Currently 1 of 4 bottles with GPC in clusters, may be more consistent with contaminant, though with tenuous clinical scenario, discussed with ID, Dr. SBaxter Flattery we will add vancomycin for now. CrCl ok.  ? ?New onset atrial fibrillation with RVR: Precipitated by sepsis.  ?- We got him converted back to NSR with diltiazem bolus and infusion followed by lopressor '5mg'$  IV x1. Since converting, becoming somewhat hypotensive. Echo also shows depressed LVEF. Cardiology consulted.  ?- Stop diltiazem gtt, continue metoprolol prn. ? ?T2DM: HbA1c 6.5% ?- Hold metformin ?- Give sensitive SSI ? ?HTN:  ?- Hold home micardis '80mg'$  and novasc '5mg'$  daily ? ?GERD:  ?- Continue PPI ? ?HLD:  ?- Continue statin ? ?COPD: No active bronchospasm.  ?- Avoiding albuterol with  no wheezing to avoid worsening RVR.  ? ?Bipolar disorder: Quiescent. ? 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 ?Subjective: Pain in RUQ is severe, but improved with dilaudid. Not hungry, but mentating clearly at the time of low blood pressures. No chest pain or dyspnea. Went into RVR last night, given LR boluses.  ? ?Objective: ?Vitals:  ? 04/26/22 1428 04/26/22 1442 04/26/22 1540 04/26/22 1550  ?BP:  92/62 (!) 79/57 (!) 84/50  ?Pulse: 83 78 80 76  ?Resp: (!) 27 (!) 23 (!) 24 15  ?Temp: (!) 101.2 ?F (38.4 ?C)  (!) 97.5 ?F (36.4 ?C)   ?TempSrc: Oral  Oral   ?SpO2: (!) 86% 91% 92% 93%  ?Weight:      ?Height:      ? ?Gen: 81y.o. male in no distress ?Pulm: Nonlabored breathing supplemental oxygen 3L, 94%. No lower wheezing or crackles.  ?CV: Rapid irreg, rate in 130's this AM. After interventions, NSR on monitor, rate 80's, regular by auscultation without murmur, rub, or gallop. No JVD, no pitting dependent edema. ?GI: Abdomen soft, tender in RUQ without rebound or guarding, non-distended, with normoactive bowel sounds.  ?Ext: Warm, no deformities ?Skin: No rashes, lesions or ulcers on visualized skin. ?Neuro: Alert and oriented. No focal neurological deficits. ?Psych: Judgement and insight appear fair. Mood euthymic & affect congruent. Behavior is appropriate.   ? ?Data Personally reviewed: ?CBC: ?Recent Labs  ?Lab 04/25/22 ?0476504/30/23 ?0103  ?WBC 27.9* 27.9*  ?HGB 14.9 14.1  ?  HCT 44.9 41.3  ?MCV 93.0 91.6  ?PLT 251 193  ? ?Basic Metabolic Panel: ?Recent Labs  ?Lab 04/25/22 ?9381 04/26/22 ?0103  ?NA 135 135  ?K 4.2 4.2  ?CL 100 105  ?CO2 22 21*  ?GLUCOSE 197* 157*  ?BUN 15 16  ?CREATININE 1.02 0.94  ?CALCIUM 9.7 9.0  ? ?GFR: ?Estimated Creatinine Clearance: 74.1 mL/min (by C-G formula based on SCr of 0.94 mg/dL). ?Liver Function Tests: ?Recent Labs  ?Lab 04/25/22 ?0175 04/26/22 ?0103  ?AST 23 21  ?ALT 21 21  ?ALKPHOS 64 46   ?BILITOT 1.1 1.2  ?PROT 7.5 6.4*  ?ALBUMIN 4.0 3.2*  ? ?Recent Labs  ?Lab 04/25/22 ?1025  ?LIPASE 25  ? ?No results for input(s): AMMONIA in the last 168 hours. ?Coagulation Profile: ?Recent Labs  ?Lab 04/26/22 ?0843  ?INR 1.3*  ? ?Cardiac Enzymes: ?No results for input(s): CKTOTAL, CKMB, CKMBINDEX, TROPONINI in the last 168 hours. ?BNP (last 3 results) ?No results for input(s): PROBNP in the last 8760 hours. ?HbA1C: ?No results for input(s): HGBA1C in the last 72 hours. ?CBG: ?Recent Labs  ?Lab 04/26/22 ?0023 04/26/22 ?0750 04/26/22 ?1013 04/26/22 ?1155 04/26/22 ?1343  ?GLUCAP 147* 152* 171* 165* 179*  ? ?Lipid Profile: ?No results for input(s): CHOL, HDL, LDLCALC, TRIG, CHOLHDL, LDLDIRECT in the last 72 hours. ?Thyroid Function Tests: ?No results for input(s): TSH, T4TOTAL, FREET4, T3FREE, THYROIDAB in the last 72 hours. ?Anemia Panel: ?No results for input(s): VITAMINB12, FOLATE, FERRITIN, TIBC, IRON, RETICCTPCT in the last 72 hours. ?Urine analysis: ?   ?Component Value Date/Time  ? COLORURINE YELLOW 04/25/2022 1232  ? APPEARANCEUR CLEAR 04/25/2022 1232  ? LABSPEC <1.005 (L) 04/25/2022 1232  ? PHURINE 6.0 04/25/2022 1232  ? GLUCOSEU 100 (A) 04/25/2022 1232  ? HGBUR TRACE (A) 04/25/2022 1232  ? [place] NEGATIVE 04/25/2022 1232  ? [place] NEGATIVE 04/25/2022 1232  ? PROTEINUR NEGATIVE 04/25/2022 1232  ? UROBILINOGEN 4.0 (H) 10/02/2015 2135  ? NITRITE POSITIVE (A) 04/25/2022 1232  ? LEUKOCYTESUR SMALL (A) 04/25/2022 1232  ? ?Recent Results (from the past 240 hour(s))  ?Culture, blood (Routine X 2) w Reflex to ID Panel     Status: None (Preliminary result)  ? Collection Time: 04/25/22  6:28 PM  ? Specimen: BLOOD  ?Result Value Ref Range Status  ? Specimen Description BLOOD RIGHT ANTECUBITAL  Final  ? Special Requests   Final  ?  BOTTLES DRAWN AEROBIC AND ANAEROBIC Blood Culture results may not be optimal due to an inadequate volume of blood received in culture bottles  ? Culture   Final  ?  NO GROWTH < 12  HOURS ?Performed at [facility], [place] [address] [place], [place] [address] ?  ? Report Status PENDING  Incomplete  ?Culture, blood (Routine X 2) w Reflex to ID Panel     Status: None (Preliminary result)  ? Collection Time: 04/25/22  6:34 PM  ? Specimen: BLOOD  ?Result Value Ref Range Status  ? Specimen Description BLOOD RIGHT ANTECUBITAL  Final  ? Special Requests   Final  ?  BOTTLES DRAWN AEROBIC ONLY Blood Culture results may not be optimal due to an inadequate volume of blood received in culture bottles  ? Culture  Setup Time   Final  ?  GRAM POSITIVE COCCI IN CLUSTERS ?AEROBIC BOTTLE ONLY ?Organism ID to follow ?Performed at [facility], [place] [address] [place], [place] [address] ?  ? Culture GRAM POSITIVE COCCI  Final  ? Report Status PENDING  Incomplete  ?Aerobic/Anaerobic Culture w Gram Stain (surgical/deep wound)     Status: None (Preliminary result)  ? Collection Time: 04/26/22 10:31 AM  ? Specimen: Gallbladder; Bile  ?Result Value Ref Range Status  ? Specimen Description GALL BLADDER  Final  ? Special Requests NONE  Final  ? Gram Stain   Final  ?  NO WBC SEEN ?ABUNDANT GRAM VARIABLE ROD ?Performed at [facility], [place] [address] [place], [place] [address] ?  ? Culture PENDING  Incomplete  ? Report Status PENDING  Incomplete  ?   ?CT ABDOMEN PELVIS W CONTRAST ? ?Result Date: 04/25/2022 ?CLINICAL DATA:  Abdominal pain, acute, nonlocalized EXAM: CT ABDOMEN AND PELVIS WITH CONTRAST TECHNIQUE: Multidetector CT imaging of the abdomen and pelvis was performed using the standard protocol following bolus administration of intravenous contrast. RADIATION DOSE REDUCTION: This exam was performed according to the departmental dose-optimization program which includes automated exposure control, adjustment of the mA and/or kV according to patient size and/or use of iterative reconstruction technique. CONTRAST:  114m OMNIPAQUE IOHEXOL 300 MG/ML  SOLN COMPARISON:  None. FINDINGS: Lower chest:  Bibasilar atelectasis. Hepatobiliary: No focal liver abnormality. 1.3 cm gallstone at the neck. Mild pericholecystic fat infiltration. 5 mm stone within the mid common bile duct. There is mild proximal biliary dil

## 2022-04-26 NOTE — Consult Note (Signed)
? ?Chief Complaint: Patient was seen in consultation today for percutaneous cholecystostomy placement ? ?Referring Physician(s): Louanna Raw, MD ? ?Supervising Physician: Michaelle Birks ? ?Patient Status: [facility] - In-pt ? ?History of Present Illness: ?Douglas Hurst. is a 81 y.o. male with a past medical history significant for bipolar disorder, COPD, HTN, HLD, hiatal hernia and diverticulosis who presented to the ED last night with complaints of right sided abdominal pain and nausea x 3 days which had become so severe he fell down and could not get up. Workup in the ED significant for WBC 27.9, normal LFTs, (+) UTI, lactic acid 2.3. CT abd/pelvis w/contrast showed: ? ?5 mm stone within the mid common bile duct with mild proximal ?biliary dilatation. There is also a 1.3 cm gallstone at the neck ?with mild pericholecystic fat infiltration raising the possibility ?of acute cholecystitis. ?  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 This may ?represent a small increase since the 2021 ultrasound. Recommend ?follow-up every 12 months and vascular consultation. ?  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 ? ?General surgery was consulted and due to the presence of stones in the CBD there is concern for postoperative bile leak with cholecystectomy, as such IR has been consulted for percutaneous cholecystostomy for source control with plans for likely cholecystectomy at a later date. ? ?Patient seen at bedside, wife present during exam - patient somnolent due to receiving pain medication recently, arouses easily to verbal cues and follows commands easily. Per wife they had to put their dog down on Thursday and patient c/o pain in his abdomen/chest around that time which they attributed to stress, however on Friday the pain continued to worsen and he developed nausea without vomiting. He had a small amount to eat during lunch on Friday but  has not had anything since. The abdominal pain became so severe on Saturday that he fell to his knees and was unable to get back up so they brought him to the hospital.  ? ?Past Medical History:  ?Diagnosis Date  ? Bipolar disorder ([place])   ? Chronic back pain   ? COPD (chronic obstructive pulmonary disease) ([place])   ? Dr. Laurann Montana  ? Diverticulosis   ? ED (erectile dysfunction)   ? Hiatal hernia   ? Hyperlipidemia   ? Hypertension   ? ? ?Past Surgical History:  ?Procedure Laterality Date  ? BALLOON DILATION N/A 10/01/2015  ? Procedure: BALLOON DILATION;  Surgeon: Garlan Fair, MD;  Location: [facility];  Service: Endoscopy;  Laterality: N/A;  ? CATARACT EXTRACTION, BILATERAL    ? COLONOSCOPY    ? removed polyps  ? COLONOSCOPY WITH PROPOFOL N/A 09/01/2016  ? Procedure: COLONOSCOPY WITH PROPOFOL;  Surgeon: Garlan Fair, MD;  Location: [facility];  Service: Endoscopy;  Laterality: N/A;  ? ESOPHAGOGASTRODUODENOSCOPY (EGD) WITH PROPOFOL N/A 10/01/2015  ? Procedure: ESOPHAGOGASTRODUODENOSCOPY (EGD) WITH PROPOFOL;  Surgeon: Garlan Fair, MD;  Location: [facility];  Service: Endoscopy;  Laterality: N/A;  ? KNEE ARTHROSCOPY    ? TONSILLECTOMY    ? ? ?Allergies: ?Patient has no known allergies. ? ?Medications: ?Prior to Admission medications   ?Medication Sig Start Date End Date Taking? Authorizing Provider  ?amLODipine (NORVASC) 5 MG tablet Take 5 mg by mouth daily. 03/31/22  Yes [provider]  ?aspirin EC 81 MG tablet Take 81 mg by mouth daily. Swallow whole.   Yes [provider]  ?atorvastatin (LIPITOR) 40 MG tablet Take 40 mg  by mouth daily.   Yes [provider]  ?metFORMIN (GLUCOPHAGE-XR) 500 MG 24 hr tablet Take 500-1,000 mg by mouth See admin instructions. Take 2 tablets (1000 mg) in the morning and 1 tablet (500 mg) at night 03/17/22  Yes [provider]  ?Multiple Vitamin (MULTIVITAMIN) tablet Take 1 tablet by mouth daily.   Yes [provider]  ?pantoprazole  (PROTONIX) 40 MG tablet Take 40 mg by mouth daily.   Yes [provider]  ?tadalafil (CIALIS) 20 MG tablet Take 20 mg by mouth every three (3) days as needed for erectile dysfunction. 03/29/22  Yes [provider]  ?telmisartan (MICARDIS) 80 MG tablet Take 80 mg by mouth daily. 02/23/22  Yes [provider]  ?  ? ?Family History  ?Problem Relation Age of Onset  ? Heart attack Mother   ? Cancer - Colon Mother   ? CAD Mother   ? Cancer Mother   ? Dementia Mother   ? CAD Father   ? Cancer Father   ? CVA Father   ? Dementia Father   ? Heart attack Brother   ? CAD Brother   ? ? ?Social History  ? ?Socioeconomic History  ? Marital status: Married  ?  Spouse name: Not on file  ? Number of children: Not on file  ? Years of education: Not on file  ? Highest education level: Not on file  ?Occupational History  ? Not on file  ?Tobacco Use  ? Smoking status: Former  ? Smokeless tobacco: Never  ?Substance and Sexual Activity  ? Alcohol use: No  ? Drug use: No  ? Sexual activity: Not on file  ?Other Topics Concern  ? Not on file  ?Social History Narrative  ? Not on file  ? ?Social Determinants of Health  ? ?Financial Resource Strain: Not on file  ?Food Insecurity: Not on file  ?Transportation Needs: Not on file  ?Physical Activity: Not on file  ?Stress: Not on file  ?Social Connections: Not on file  ? ? ? ?Review of Systems: A 12 point ROS discussed and pertinent positives are indicated in the HPI above.  All other systems are negative. ? ?Review of Systems  ?Constitutional:  Positive for appetite change and fatigue. Negative for chills and fever.  ?Respiratory:  Negative for cough and shortness of breath.   ?Cardiovascular:  Negative for chest pain.  ?Gastrointestinal:  Negative for abdominal pain (none currently after receiving pain medications recently), diarrhea, nausea and vomiting.  ?Musculoskeletal:  Negative for back pain.  ?Skin:  Negative for color change.  ?Neurological:  Negative for dizziness  and headaches.  ? ?Vital Signs: ?BP (!) 144/98   Pulse (!) 136   Temp 98.4 ?F (36.9 ?C) (Oral)   Resp 20   Ht '6\' 2"'$  (1.88 m)   Wt 186 lb 15.2 oz (84.8 kg)   SpO2 96%   BMI 24.00 kg/m?  ? ?Physical Exam ?Vitals and nursing note reviewed.  ?Constitutional:   ?   General: He is not in acute distress. ?   Appearance: He is ill-appearing.  ?HENT:  ?   Head: Normocephalic.  ?   Mouth/Throat:  ?   Mouth: Mucous membranes are moist.  ?   Pharynx: Oropharynx is clear. No oropharyngeal exudate or posterior oropharyngeal erythema.  ?Cardiovascular:  ?   Rate and Rhythm: Regular rhythm. Tachycardia present.  ?Pulmonary:  ?   Effort: Pulmonary effort is normal.  ?   Breath sounds: Normal breath sounds.  ?  Abdominal:  ?   General: There is no distension.  ?   Palpations: Abdomen is soft.  ?   Tenderness: There is no abdominal tenderness.  ?Skin: ?   General: Skin is warm and dry.  ?   Coloration: Skin is not jaundiced.  ?Neurological:  ?   Mental Status: He is alert and oriented to person, place, and time.  ?Psychiatric:     ?   Mood and Affect: Mood normal.     ?   Behavior: Behavior normal.     ?   Thought Content: Thought content normal.     ?   Judgment: Judgment normal.  ? ? ? ?MD Evaluation ?Airway: WNL ?Heart: WNL ?Abdomen: WNL ?Chest/ Lungs: WNL ?ASA  Classification: 3 ?Mallampati/Airway Score: One ? ? ?Imaging: ?CT ABDOMEN PELVIS W CONTRAST ? ?Result Date: 04/25/2022 ?CLINICAL DATA:  Abdominal pain, acute, nonlocalized EXAM: CT ABDOMEN AND PELVIS WITH CONTRAST TECHNIQUE: Multidetector CT imaging of the abdomen and pelvis was performed using the standard protocol following bolus administration of intravenous contrast. RADIATION DOSE REDUCTION: This exam was performed according to the departmental dose-optimization program which includes automated exposure control, adjustment of the mA and/or kV according to patient size and/or use of iterative reconstruction technique. CONTRAST:  148m OMNIPAQUE IOHEXOL 300 MG/ML   SOLN COMPARISON:  None. FINDINGS: Lower chest: Bibasilar atelectasis. Hepatobiliary: No focal liver abnormality. 1.3 cm gallstone at the neck. Mild pericholecystic fat infiltration. 5 mm stone within the mid

## 2022-04-26 NOTE — Sedation Documentation (Signed)
Patient transported to [place]. Nadine RN at the bedside. Heart rate on the monitor 120-140s Afib, sats 90 on room air at baseline. BP 939S systolic. Patient complains of back pain at this time. Site check is clean, dry and intact. No drainage from dressing. JP bulb in place with drainage noted.  ?

## 2022-04-26 NOTE — Progress Notes (Signed)
? ?Subjective/Chief Complaint: ?Afib rvr, bp ok ,ruq pain ? ? ?Objective: ?Vital signs in last 24 hours: ?Temp:  [97.7 ?F (36.5 ?C)-99.7 ?F (37.6 ?C)] 97.7 ?F (36.5 ?C) (04/30 0938) ?Pulse Rate:  [62-155] 93 (04/30 0928) ?Resp:  [16-31] 31 (04/30 1829) ?BP: (89-144)/(44-98) 122/68 (04/30 9371) ?SpO2:  [88 %-99 %] 90 % (04/30 0928) ?Weight:  [84.8 kg] 84.8 kg (04/29 1734) ?  ? ?Intake/Output from previous day: ?04/29 0701 - 04/30 0700 ?In: 500 [IV [id number]] ?Out: -  ?Intake/Output this shift: ?Total I/O ?In: -  ?Out: 175 [Urine:175] ? ?Gi: tender ruq with murphys sign ? ?Lab Results:  ?Recent Labs  ?  04/25/22 ?5102 04/26/22 ?0103  ?WBC 27.9* 27.9*  ?HGB 14.9 14.1  ?HCT 44.9 41.3  ?PLT 251 193  ? ?BMET ?Recent Labs  ?  04/25/22 ?0954 04/26/22 ?0103  ?NA 135 135  ?K 4.2 4.2  ?CL 100 105  ?CO2 22 21*  ?GLUCOSE 197* 157*  ?BUN 15 16  ?CREATININE 1.02 0.94  ?CALCIUM 9.7 9.0  ? ?PT/INR ?Recent Labs  ?  04/26/22 ?0843  ?LABPROT 16.0*  ?INR 1.3*  ? ?ABG ?No results for input(s): PHART, HCO3 in the last 72 hours. ? ?Invalid input(s): PCO2, PO2 ? ?Studies/Results: ?CT ABDOMEN PELVIS W CONTRAST ? ?Result Date: 04/25/2022 ?CLINICAL DATA:  Abdominal pain, acute, nonlocalized EXAM: CT ABDOMEN AND PELVIS WITH CONTRAST TECHNIQUE: Multidetector CT imaging of the abdomen and pelvis was performed using the standard protocol following bolus administration of intravenous contrast. RADIATION DOSE REDUCTION: This exam was performed according to the departmental dose-optimization program which includes automated exposure control, adjustment of the mA and/or kV according to patient size and/or use of iterative reconstruction technique. CONTRAST:  155m OMNIPAQUE IOHEXOL 300 MG/ML  SOLN COMPARISON:  None. FINDINGS: Lower chest: Bibasilar atelectasis. Hepatobiliary: No focal liver abnormality. 1.3 cm gallstone at the neck. Mild pericholecystic fat infiltration. 5 mm stone within the mid common bile duct. There is mild proximal biliary  dilatation. Pancreas: Unremarkable. Spleen: Unremarkable. Adrenals/Urinary Tract: Adrenals are unremarkable. No hydronephrosis or renal calculi. Circumferential bladder wall thickening probably due to chronic outlet obstruction. Stomach/Bowel: Stomach is within normal limits. Bowel is normal in caliber. A duodenal diverticulum is noted. Moderate stool throughout the colon. Vascular/Lymphatic: Atherosclerosis. 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 Reproductive: Enlarged prostate. Other: No free fluid.  No acute abnormality of the abdominal wall. Musculoskeletal: Degenerative changes of the included spine. IMPRESSION: 5 mm stone within the mid common bile duct with mild proximal biliary dilatation. There is also a 1.3 cm gallstone at the neck with mild pericholecystic fat infiltration raising the possibility of acute cholecystitis. 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 This may represent a small increase since the 2021 ultrasound. Recommend follow-up every 12 months and vascular consultation. 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 Electronically Signed   By: PMacy MisM.D.   On: 04/25/2022 12:31  ? ?DG Chest Portable 1 View ? ?Result Date: 04/25/2022 ?CLINICAL DATA:  Generalized weakness, RIGHT upper quadrant pain EXAM: PORTABLE CHEST 1 VIEW COMPARISON:  Portable exam 1119 hours compared to 10/02/2015 FINDINGS: Normal heart size, mediastinal contours, and pulmonary vascularity. Bibasilar atelectasis. Lungs otherwise clear. No infiltrate, pleural effusion, or pneumothorax. IMPRESSION: Bibasilar atelectasis. Electronically Signed   By: MLavonia DanaM.D.   On: 04/25/2022 11:53   ? ?Anti-infectives: ?Anti-infectives (From admission, onward)  ? ?  Start     Dose/Rate Route Frequency Ordered Stop  ? 04/25/22 2000   piperacillin-tazobactam (ZOSYN) IVPB 3.375 g       ? 3.375 g ?12.5 mL/hr over 240 Minutes Intravenous Every 8 hours 04/25/22 1522    ? 04/25/22 1245  piperacillin-tazobactam (ZOSYN) IVPB 3.375 g       ? 3.375 g ?100 mL/hr over 30 Minutes Intravenous  Once 04/25/22 1238 04/25/22 1353  ? ?  ? ? ?Assessment/Plan: ?Cholecystitis ?Choledocholithiasis ?-IR drain today, continue abx ?-afib with rvr, nl bp per medicine ?-needs an ERCP at some point ? ?I reviewed hospitalist notes, last 24 h vitals and pain scores, last 48 h intake and output, last 24 h labs and trends, and last 24 h imaging results. ? ?This care required straight-forward level of medical decision making.  ? ? ?Rolm Bookbinder ?04/26/2022 ? ?

## 2022-04-27 DIAGNOSIS — I714 Abdominal aortic aneurysm, without rupture, unspecified: Secondary | ICD-10-CM | POA: Diagnosis not present

## 2022-04-27 DIAGNOSIS — E782 Mixed hyperlipidemia: Secondary | ICD-10-CM | POA: Diagnosis not present

## 2022-04-27 DIAGNOSIS — I5021 Acute systolic (congestive) heart failure: Secondary | ICD-10-CM

## 2022-04-27 DIAGNOSIS — K8000 Calculus of gallbladder with acute cholecystitis without obstruction: Secondary | ICD-10-CM | POA: Diagnosis not present

## 2022-04-27 DIAGNOSIS — I10 Essential (primary) hypertension: Secondary | ICD-10-CM | POA: Diagnosis not present

## 2022-04-27 DIAGNOSIS — K8042 Calculus of bile duct with acute cholecystitis without obstruction: Secondary | ICD-10-CM | POA: Diagnosis not present

## 2022-04-27 DIAGNOSIS — I480 Paroxysmal atrial fibrillation: Secondary | ICD-10-CM

## 2022-04-27 DIAGNOSIS — E1142 Type 2 diabetes mellitus with diabetic polyneuropathy: Secondary | ICD-10-CM | POA: Diagnosis not present

## 2022-04-27 LAB — BASIC METABOLIC PANEL
Anion gap: 8 (ref 5–15)
BUN: 24 mg/dL — ABNORMAL HIGH (ref 8–23)
CO2: 26 mmol/L (ref 22–32)
Calcium: 8.7 mg/dL — ABNORMAL LOW (ref 8.9–10.3)
Chloride: 104 mmol/L (ref 98–111)
Creatinine, Ser: 1.09 mg/dL (ref 0.61–1.24)
GFR, Estimated: >60 mL/min (ref >60)
Glucose, Bld: 159 mg/dL — ABNORMAL HIGH (ref 70–99)
Potassium: 3.9 mmol/L (ref 3.5–5.1)
Sodium: 138 mmol/L (ref 135–145)

## 2022-04-27 LAB — CBC
HCT: 37.1 % — ABNORMAL LOW (ref 39.0–52.0)
Hemoglobin: 12 g/dL — ABNORMAL LOW (ref 13.0–17.0)
MCH: 30.4 pg (ref 26.0–34.0)
MCHC: 32.3 g/dL (ref 30.0–36.0)
MCV: 93.9 fL (ref 80.0–100.0)
Platelets: 169 10*3/uL (ref 150–400)
RBC: 3.95 MIL/uL — ABNORMAL LOW (ref 4.22–5.81)
RDW: 14.2 % (ref 11.5–15.5)
WBC: 15.7 10*3/uL — ABNORMAL HIGH (ref 4.0–10.5)
nRBC: 0 % (ref 0.0–0.2)

## 2022-04-27 LAB — GLUCOSE, CAPILLARY
Glucose-Capillary: 166 mg/dL — ABNORMAL HIGH (ref 70–99)
Glucose-Capillary: 125 mg/dL — ABNORMAL HIGH (ref 70–99)
Glucose-Capillary: 104 mg/dL — ABNORMAL HIGH (ref 70–99)

## 2022-04-27 MED ORDER — SODIUM CHLORIDE 0.9 % IV BOLUS
500.0000 mL | Freq: Once | INTRAVENOUS | Status: AC
  Administered 2022-04-27: 500 mL via INTRAVENOUS

## 2022-04-27 MED ORDER — METOPROLOL TARTRATE 25 MG PO TABS
25.0000 mg | ORAL_TABLET | Freq: Two times a day (BID) | ORAL | Status: DC
Start: 2022-04-27 — End: 2022-04-30
  Administered 2022-04-27 – 2022-04-30 (×6): 25 mg via ORAL
  Filled 2022-04-27 (×6): qty 1

## 2022-04-27 MED ORDER — METOPROLOL TARTRATE 12.5 MG HALF TABLET
12.5000 mg | ORAL_TABLET | Freq: Two times a day (BID) | ORAL | Status: DC
  Administered 2022-04-27: 12.5 mg via ORAL
  Filled 2022-04-27: qty 1

## 2022-04-27 NOTE — Significant Event (Signed)
Rapid Response Event Note  ? ?Reason for Call :  ?Afib RVR ? ?Initial Focused Assessment:  ?Pt lying in bed with eyes open, alert and oriented, in no distress. Pt denies CP/SOB/dizziness. Lungs clear t/o. Skin warm to touch. ? ?T-98.5, HR-153, BP-122/76, RR-19, SpO2-96% on RA. ? ?Interventions:  ?Give schedule PO dose of metoprolol ?500cc NS bolus ?Plan of Care:  ?Allow time for bolus and po metoprolol to work. If no improvement in HR, pt may need IV amiodarone. Continue to monitor closely. Call RRT if further assistance needed.  ? ?Event Summary:  ? ?MD Notified: Olena Heckle, NP notified, Cards MD notified ?Call [place] ?Arrival [id number] ?End Time:2239 ? ?Dillard Essex, RN ?

## 2022-04-27 NOTE — Progress Notes (Signed)
Call by CCMD, pt HR in 180s, A-fib. Upon walking to pt room, pt was attempting to go to bathroom. Pt instructed to use urinal and stay in bed due to elevated HR.  ?On assessment, pt asymptomatic, denied chest pain, SOB, dizziness, palpitations.  ?NP on call paged, and RR nurse notified. ?BP stable and metoprolol '5mg'$  IV PRN given.  ?About 5 min later, pt HR in 140s and 150s. ?RR nurse at the bedside, pt remains asymptomatic.  ?Cardiology consulted. ?Olena Heckle, NP ordered bolus, bolus given, no significant change in pt HR. ?NP on call notified, and another metoprolol '5mg'$  IV given.  ?No improvement in HR and NP on call notified. ?Rounding MD at the bedside, assessing pt. ?New order for EKG, Heparin IV and Amiodarone placed. ?New med administered and EKG completed. ?Around 0245 pt converted to NSR, HR in 80s and 90s.  ?Around 0430 pt converted to a-fib, HR in 120s and 130s, pt still asymptomatic.  ?At 0520 NSR again. ?Will continue to monitor pt closely ?

## 2022-04-27 NOTE — Progress Notes (Signed)
?Progress Note ? ?Patient: Douglas Hurst. HWE:[id number] DOB: Mar 27, 1941  ?DOA: 04/25/2022  DOS: 04/27/2022  ?  ?Brief hospital course: ?Douglas Hurst. is a 81 y.o. male with a history of COPD, HTN, HLD, T2DM, BPH, GERD, AAA, chronic back pain and bipolar disorder who presented to the ED 4/29 with RUQ pain for a couple days and weakness causing fall and inability to get back up. He was afebrile with leukocytosis to 27.9k and CT suggestive of acute cholecystitis with 1.3cm gallstone in the neck of the gallbladder and 27m CBD stone with proximal biliary dilatation. He was given IVF, zosyn, and admitted for further management. General surgery and GI have recommended cholecystostomy tube for management which has been performed 4/30. He has developed AFib with RVR improved with diltiazem infusion and IV metoprolol, changed to progressive care status.  ? ?Assessment and Plan: ?Sepsis due to acute calculus cholecystitis: ?- s/p cholecystostomy 4/30, continue routine drain care/flushed and output monitoring. Will follow up with IR for cholangiogram after discharge. If still choledocholithiasis, would need ERCP. Will need eventual lap cholecystectomy per surgery.  ?- Continue zosyn ?- If tolerating advancing diet, can stop IVF given newly depressed LVEF. ?- Monitor blood cultures and GB culture. Currently 1 of 4 bottles with GPC in clusters, may be more consistent with contaminant, though with tenuous clinical scenario, discussed with ID, Dr. SBaxter Flattery we will add vancomycin for now. CrCl ok.  ? ?New onset atrial fibrillation with RVR: Precipitated by sepsis. Quickly converted with dilt gtt, remains in NSR with metoprolol.  ?- Initiated low dose metoprolol. Fortunately BP rising (baseline HTN) with sepsis resolution. Will increase tartrate dose this evening and plan to consolidate to succinate 5/2.  ?- No anticoagulation given transient episode that was provoked, unless reverts to afib.  ?- Cardiology consult appreciated.  Will discharge with 14 day monitor.  ? ?Hypoxia: Unclear etiology. Exam reassuring.  ?- Check CXR if unable to wean today.  ? ?New diagnosis HFrEF:  ?- Euvolemic currently, no diuretic required. ?- BB as above. ?- If BP trends continue upward, will be able to initiate losartan in AM.  ? ?T2DM: HbA1c 6.5% ?- Hold metformin ?- Give sensitive SSI ? ?HTN:  ?- Hold home micardis '80mg'$ , will DC norvasc.  ? ?GERD:  ?- Continue PPI ? ?HLD:  ?- Continue statin ? ?COPD: No active bronchospasm.  ?- Avoiding albuterol with no wheezing to avoid worsening RVR.  ? ?Bipolar disorder: Quiescent. ? 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 ?Subjective: Pain is better. No chest pain or dyspnea or other complaints. Tolerating a diet later in the day today. No fevers.  ? ?Objective: ?Vitals:  ? 04/27/22 0438 04/27/22 0807 04/27/22 1346 04/27/22 1628  ?BP: 105/61 120/70 132/84 130/80  ?Pulse: 84 84 93 85  ?Resp: '17 17 18 18  '$ ?Temp:  98.5 ?F (36.9 ?C) 98 ?F (36.7 ?C) 97.7 ?F (36.5 ?C)  ?TempSrc:    Oral  ?SpO2: 94% 95% 98% 97%  ?Weight:      ?Height:      ? ?Gen: 81y.o. male in no distress ?Pulm: Nonlabored breathing supplemental oxygen. .Marland Kitchen?CV: Regular rate and rhythm. No murmur, rub, or gallop. No JVD, no dependent edema. ?GI: Abdomen soft, moderate-significant RUQ tenderness without rebound or guarding. Elsewhere non-tender, non-distended, with normoactive bowel sounds.  ?Ext: Warm, no deformities ?Skin: No rashes, lesions or ulcers on visualized skin. Far lateral cholecystostomy  tube site c/d/i draining bile.  ?Neuro: Alert and incompletely oriented. No focal neurological deficits. ?Psych: Judgement and insight appear fair. Mood euthymic & affect congruent. Behavior is appropriate.   ? ?Data Personally reviewed: ?CBC: ?Recent Labs  ?Lab 04/25/22 ?4854 04/26/22 ?0103 04/27/22 ?6270  ?WBC 27.9* 27.9* 15.7*  ?HGB 14.9 14.1 12.0*  ?HCT 44.9 41.3 37.1*   ?MCV 93.0 91.6 93.9  ?PLT 251 193 169  ? ?Basic Metabolic Panel: ?Recent Labs  ?Lab 04/25/22 ?3500 04/26/22 ?0103 04/27/22 ?9381  ?NA 135 135 138  ?K 4.2 4.2 3.9  ?CL 100 105 104  ?CO2 22 21* 26  ?GLUCOSE 197* 157* 159*  ?BUN 15 16 24*  ?CREATININE 1.02 0.94 1.09  ?CALCIUM 9.7 9.0 8.7*  ? ?GFR: ?Estimated Creatinine Clearance: 63.9 mL/min (by C-G formula based on SCr of 1.09 mg/dL). ?Liver Function Tests: ?Recent Labs  ?Lab 04/25/22 ?8299 04/26/22 ?0103  ?AST 23 21  ?ALT 21 21  ?ALKPHOS 64 46  ?BILITOT 1.1 1.2  ?PROT 7.5 6.4*  ?ALBUMIN 4.0 3.2*  ? ?Recent Labs  ?Lab 04/25/22 ?3716  ?LIPASE 25  ? ?No results for input(s): AMMONIA in the last 168 hours. ?Coagulation Profile: ?Recent Labs  ?Lab 04/26/22 ?0843  ?INR 1.3*  ? ?Cardiac Enzymes: ?No results for input(s): CKTOTAL, CKMB, CKMBINDEX, TROPONINI in the last 168 hours. ?BNP (last 3 results) ?No results for input(s): PROBNP in the last 8760 hours. ?HbA1C: ?No results for input(s): HGBA1C in the last 72 hours. ?CBG: ?Recent Labs  ?Lab 04/26/22 ?1733 04/26/22 ?1956 04/27/22 ?9678 04/27/22 ?1125 04/27/22 ?1624  ?GLUCAP 146* 180* 166* 125* 104*  ? ?Lipid Profile: ?No results for input(s): CHOL, HDL, LDLCALC, TRIG, CHOLHDL, LDLDIRECT in the last 72 hours. ?Thyroid Function Tests: ?No results for input(s): TSH, T4TOTAL, FREET4, T3FREE, THYROIDAB in the last 72 hours. ?Anemia Panel: ?No results for input(s): VITAMINB12, FOLATE, FERRITIN, TIBC, IRON, RETICCTPCT in the last 72 hours. ?Urine analysis: ?   ?Component Value Date/Time  ? COLORURINE YELLOW 04/25/2022 1232  ? APPEARANCEUR CLEAR 04/25/2022 1232  ? LABSPEC <1.005 (L) 04/25/2022 1232  ? PHURINE 6.0 04/25/2022 1232  ? GLUCOSEU 100 (A) 04/25/2022 1232  ? HGBUR TRACE (A) 04/25/2022 1232  ? [place] NEGATIVE 04/25/2022 1232  ? [place] NEGATIVE 04/25/2022 1232  ? PROTEINUR NEGATIVE 04/25/2022 1232  ? UROBILINOGEN 4.0 (H) 10/02/2015 2135  ? NITRITE POSITIVE (A) 04/25/2022 1232  ? LEUKOCYTESUR SMALL (A) 04/25/2022  1232  ? ?Recent Results (from the past 240 hour(s))  ?Culture, blood (Routine X 2) w Reflex to ID Panel     Status: None (Preliminary result)  ? Collection Time: 04/25/22  6:28 PM  ? Specimen: BLOOD  ?Result Value Ref Range Status  ? Specimen Description BLOOD RIGHT ANTECUBITAL  Final  ? Special Requests   Final  ?  BOTTLES DRAWN AEROBIC AND ANAEROBIC Blood Culture results may not be optimal due to an inadequate volume of blood received in culture bottles  ? Culture   Final  ?  NO GROWTH 2 DAYS ?Performed at [facility], [place] [address] [place], [place] [address] ?  ? Report Status PENDING  Incomplete  ?Culture, blood (Routine X 2) w Reflex to ID Panel     Status: Abnormal (Preliminary result)  ? Collection Time: 04/25/22  6:34 PM  ? Specimen: BLOOD  ?Result Value Ref Range Status  ? Specimen Description BLOOD RIGHT ANTECUBITAL  Final  ? Special Requests   Final  ?  BOTTLES DRAWN AEROBIC ONLY Blood  Culture results may not be optimal due to an inadequate volume of blood received in culture bottles  ? Culture  Setup Time   Final  ?  GRAM POSITIVE COCCI IN CLUSTERS ?AEROBIC BOTTLE ONLY ?CRITICAL RESULT CALLED TO, READ BACK BY AND VERIFIED WITH: PHARM D M.PHAM ON [id number] AT 1741 BY E.PARRISH ?  ? Culture (A)  Final  ?  STAPHYLOCOCCUS HOMINIS ?THE SIGNIFICANCE OF ISOLATING THIS ORGANISM FROM A SINGLE SET OF BLOOD CULTURES WHEN MULTIPLE SETS ARE DRAWN IS UNCERTAIN. PLEASE NOTIFY THE MICROBIOLOGY DEPARTMENT WITHIN ONE WEEK IF SPECIATION AND SENSITIVITIES ARE REQUIRED. ?Performed at [facility], [place] [address] [place], [place] [address] ?  ? Report Status PENDING  Incomplete  ?Blood Culture ID Panel (Reflexed)     Status: Abnormal  ? Collection Time: 04/25/22  6:34 PM  ?Result Value Ref Range Status  ? Enterococcus faecalis NOT DETECTED NOT DETECTED Final  ? Enterococcus Faecium NOT DETECTED NOT DETECTED Final  ? Listeria monocytogenes NOT DETECTED NOT DETECTED Final  ? Staphylococcus species DETECTED (A)  NOT DETECTED Final  ?  Comment: CRITICAL RESULT CALLED TO, READ BACK BY AND VERIFIED WITH: ?PHARM D M.PHAM ON [id number] AT 1741 BY E.PARRISH ?  ? Staphylococcus aureus (BCID) NOT DETECTED NOT DETECTED Fina

## 2022-04-27 NOTE — Progress Notes (Addendum)
? ?Progress Note ? ?Patient Name: Douglas Hurst. ?Date of Encounter: 04/27/2022 ? ?[facility] Cardiologist: None New ? ?Subjective  ? ?Some abdominal pain but improving.  No chest pain, shortness of breath, palpitation.  No recurrent episode of atrial fibrillation. ? ?Inpatient Medications  ?  ?Scheduled Meds: ? atorvastatin  40 mg Oral Daily  ? insulin aspart  0-9 Units Subcutaneous TID WC  ? pantoprazole (PROTONIX) IV  40 mg Intravenous Q24H  ? sodium chloride flush  5 mL Intracatheter Q8H  ? ?Continuous Infusions: ? lactated ringers 100 mL/hr at 04/26/22 2114  ? piperacillin-tazobactam (ZOSYN)  IV 3.375 g (04/27/22 0228)  ? vancomycin 1,000 mg (04/27/22 1829)  ? ?PRN Meds: ?acetaminophen **OR** acetaminophen, alum & mag hydroxide-simeth, HYDROmorphone (DILAUDID) injection, metoprolol tartrate, nitroGLYCERIN, ondansetron **OR** ondansetron (ZOFRAN) IV  ? ?Vital Signs  ?  ?Vitals:  ? 04/27/22 0038 04/27/22 9371 04/27/22 6967 04/27/22 8938  ?BP: 114/72 104/64 105/61 120/70  ?Pulse: 94 86 84 84  ?Resp: '20 12 17 17  '$ ?Temp: 98.5 ?F (36.9 ?C) 98.3 ?F (36.8 ?C)  98.5 ?F (36.9 ?C)  ?TempSrc: Oral Oral    ?SpO2: 95% 94% 94% 95%  ?Weight:      ?Height:      ? ? ?Intake/Output Summary (Last 24 hours) at 04/27/2022 0946 ?Last data filed at 04/27/2022 1017 ?Gross per 24 hour  ?Intake 2402.85 ml  ?Output 1420 ml  ?Net 982.85 ml  ? ? ?  04/25/2022  ?  5:34 PM 09/01/2016  ?  6:36 AM 10/02/2015  ?  7:43 PM  ?Last 3 Weights  ?Weight (lbs) 186 lb 15.2 oz 208 lb 208 lb 14.4 oz  ?Weight (kg) 84.8 kg 94.348 kg 94.756 kg  ?   ? ?Telemetry  ?  ?Normal sinus rhythm/tachycardia- Personally Reviewed ? ?ECG  ?  ?N/A ? ?Physical Exam  ? ?GEN: No acute distress.   ?Neck: No JVD ?Cardiac: RRR, no murmurs, rubs, or gallops.  ?Respiratory: Clear to auscultation bilaterally. ?GI: Soft, + tender, non-distended  ?MS: No edema; No deformity. ?Neuro:  Nonfocal  ?Psych: Normal affect  ? ?Labs  ?  ?High Sensitivity Troponin:   ?Recent Labs  ?Lab  04/26/22 ?0217 04/26/22 ?5102  ?TROPONINIHS 24* 26*  ?   ?Chemistry ?Recent Labs  ?Lab 04/25/22 ?5852 04/26/22 ?0103 04/27/22 ?7782  ?NA 135 135 138  ?K 4.2 4.2 3.9  ?CL 100 105 104  ?CO2 22 21* 26  ?GLUCOSE 197* 157* 159*  ?BUN 15 16 24*  ?CREATININE 1.02 0.94 1.09  ?CALCIUM 9.7 9.0 8.7*  ?PROT 7.5 6.4*  --   ?ALBUMIN 4.0 3.2*  --   ?AST 23 21  --   ?ALT 21 21  --   ?ALKPHOS 64 46  --   ?BILITOT 1.1 1.2  --   ?GFRNONAA >60 >60 >60  ?ANIONGAP '13 9 8  '$ ?  ?Lipids No results for input(s): CHOL, TRIG, HDL, LABVLDL, LDLCALC, CHOLHDL in the last 168 hours.  ?Hematology ?Recent Labs  ?Lab 04/25/22 ?4235 04/26/22 ?0103 04/27/22 ?3614  ?WBC 27.9* 27.9* 15.7*  ?RBC 4.83 4.51 3.95*  ?HGB 14.9 14.1 12.0*  ?HCT 44.9 41.3 37.1*  ?MCV 93.0 91.6 93.9  ?MCH 30.8 31.3 30.4  ?MCHC 33.2 34.1 32.3  ?RDW 14.0 14.4 14.2  ?PLT 251 193 169  ? ? ? ?Radiology  ?  ?CT ABDOMEN PELVIS W CONTRAST ? ?Result Date: 04/25/2022 ?CLINICAL DATA:  Abdominal pain, acute, nonlocalized EXAM: CT ABDOMEN AND PELVIS WITH CONTRAST  TECHNIQUE: Multidetector CT imaging of the abdomen and pelvis was performed using the standard protocol following bolus administration of intravenous contrast. RADIATION DOSE REDUCTION: This exam was performed according to the departmental dose-optimization program which includes automated exposure control, adjustment of the mA and/or kV according to patient size and/or use of iterative reconstruction technique. CONTRAST:  151m OMNIPAQUE IOHEXOL 300 MG/ML  SOLN COMPARISON:  None. FINDINGS: Lower chest: Bibasilar atelectasis. Hepatobiliary: No focal liver abnormality. 1.3 cm gallstone at the neck. Mild pericholecystic fat infiltration. 5 mm stone within the mid common bile duct. There is mild proximal biliary dilatation. Pancreas: Unremarkable. Spleen: Unremarkable. Adrenals/Urinary Tract: Adrenals are unremarkable. No hydronephrosis or renal calculi. Circumferential bladder wall thickening probably due to chronic outlet obstruction.  Stomach/Bowel: Stomach is within normal limits. Bowel is normal in caliber. A duodenal diverticulum is noted. Moderate stool throughout the colon. Vascular/Lymphatic: Atherosclerosis. 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 Reproductive: Enlarged prostate. Other: No free fluid.  No acute abnormality of the abdominal wall. Musculoskeletal: Degenerative changes of the included spine. IMPRESSION: 5 mm stone within the mid common bile duct with mild proximal biliary dilatation. There is also a 1.3 cm gallstone at the neck with mild pericholecystic fat infiltration raising the possibility of acute cholecystitis. 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 This may represent a small increase since the 2021 ultrasound. Recommend follow-up every 12 months and vascular consultation. 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 Electronically Signed   By: PMacy MisM.D.   On: 04/25/2022 12:31  ? ?IR Perc Cholecystostomy ? ?Result Date: 04/26/2022 ?INDICATION: Acute cholecystitis EXAM: ULTRASOUND AND FLUOROSCOPIC-GUIDED CHOLECYSTOSTOMY TUBE PLACEMENT COMPARISON:  CT AP, 04/25/2022 MEDICATIONS: Antibiotics were administered within an appropriate time frame prior to skin puncture. ANESTHESIA/SEDATION: Local anesthetic and single agent sedation was employed during this procedure. A total of fentanyl 25 mcg was administered intravenously. The patient's level of consciousness and vital signs were monitored continuously by radiology nursing throughout the procedure under my direct supervision. CONTRAST:  538mOMNIPAQUE IOHEXOL 300 MG/ML SOLN - administered into the gallbladder fossa. FLUOROSCOPY TIME:  Fluoroscopic dose; 6.7 mGy COMPLICATIONS: None immediate. PROCEDURE: Informed written consent was obtained from the patient after a discussion of the risks,  benefits and alternatives to treatment. Questions regarding the procedure were encouraged and answered. A timeout was performed prior to the initiation of the procedure. The right upper abdominal quadrant was prepped and draped in the usual sterile fashion, and a sterile drape was applied covering the operative field. Maximum barrier sterile technique with sterile gowns and gloves were used for the procedure. A timeout was performed prior to the initiation of the procedure. Local anesthesia was provided with 1% lidocaine with epinephrine. Ultrasound scanning of the right upper quadrant demonstrates a markedly dilated gallbladder. Of note, the patient reported pain with ultrasound imaging over the gallbladder. Utilizing a transhepatic approach, a 18 gauge, 15 cm trocar needle was advanced into the gallbladder under direct ultrasound guidance. An ultrasound image was saved for documentation purposes. Appropriate intraluminal puncture was confirmed with the efflux of bile and advancement of an 0.018 wire into the gallbladder lumen. The needle was exchanged for an [place]. A small amount of contrast was injected to confirm appropriate intraluminal positioning. Over a [employer] advisera 10 Fr Cook cholecystomy tube was advanced into the gallbladder fossa, coiled and locked. Bile was aspirated and a small amount  of contrast was injected as several post procedural spot radiographic images were obtained in various obliquities. The catheter was secured to the skin with suture, connected to a drainage bag and a dressing was placed. The patient tolerated the procedure well without immediate post procedural complication. IMPRESSION: Successful placement of a 10 Fr cholecystostomy drainage catheter, as above. PLAN: The patient is to return to [facility] ([facility]) for routine catheter evaluation and exchange in 6 weeks. Michaelle Birks, MD [facility]  Electronically Signed   By: Michaelle Birks M.D.   On: 04/26/2022 11:50  ? ?DG Chest Portable 1 View ? ?Result Date: 04/25/2022 ?CLINICAL DATA:  Generalized weakness, RIGHT upper quadrant pain EXAM: PORTABLE CHEST 1

## 2022-04-27 NOTE — Progress Notes (Signed)
[facility] Progress Note ? ?Douglas Hurst. 81 y.o. 06-10-1941 ? ? ?Subjective: ?Feels ok. Denies abdominal pain. Wife at bedside. ? ?Objective: ?Vital signs: ?Vitals:  ? 04/27/22 0807 04/27/22 1346  ?BP: 120/70 132/84  ?Pulse: 84 93  ?Resp: 17 18  ?Temp: 98.5 ?F (36.9 ?C) 98 ?F (36.7 ?C)  ?SpO2: 95% 98%  ? ? ?Physical Exam: ?Gen: lethargic, elderly, well-nourished, no acute distress  ?HEENT: anicteric sclera ?CV: RRR ?Chest: CTA B ?Abd: RUQ and epigastric tenderness with guarding, soft, nondistended, +BS ?Ext: no edema ? ?Lab Results: ?Recent Labs  ?  04/26/22 ?0103 04/27/22 ?3009  ?NA 135 138  ?K 4.2 3.9  ?CL 105 104  ?CO2 21* 26  ?GLUCOSE 157* 159*  ?BUN 16 24*  ?CREATININE 0.94 1.09  ?CALCIUM 9.0 8.7*  ? ?Recent Labs  ?  04/25/22 ?0954 04/26/22 ?0103  ?AST 23 21  ?ALT 21 21  ?ALKPHOS 64 46  ?BILITOT 1.1 1.2  ?PROT 7.5 6.4*  ?ALBUMIN 4.0 3.2*  ? ?Recent Labs  ?  04/26/22 ?0103 04/27/22 ?0733  ?WBC 27.9* 15.7*  ?HGB 14.1 12.0*  ?HCT 41.3 37.1*  ?MCV 91.6 93.9  ?PLT 193 169  ? ? ? ? ?Assessment/Plan: ?Acute cholecystitis with nonobstructing CBD stone and normal LFTs. S/P cholecystostomy tube placement. Consider cholangiogram through cholecystostomy tube in near future and if CBD stone still present then preop ERCP as outpt. F/U with [facility] in 4-6 weeks. Will sign off. Call if questions.  ? ?Lear Ng ?04/27/2022, 3:30 PM ? ?Questions please call [phone] Patient ID: Douglas Villard., male   DOB: 1941-06-26, 81 y.o.   MRN: [id number] ? ?

## 2022-04-27 NOTE — Progress Notes (Addendum)
? ?Subjective/Chief Complaint: ?Overall feels better, still some RUQ pain and soreness around drain. Denies BM. Denies nausea or vomiting. No longer in afib. ? ? ?Objective: ?Vital signs in last 24 hours: ?Temp:  [97.4 ?F (36.3 ?C)-101.2 ?F (38.4 ?C)] 98.5 ?F (36.9 ?C) (05/01 2536) ?Pulse Rate:  [76-164] 84 (05/01 0807) ?Resp:  [12-32] 17 (05/01 0807) ?BP: (73-126)/(41-72) 120/70 (05/01 0807) ?SpO2:  [86 %-100 %] 95 % (05/01 0807) ?  ? ?Intake/Output from previous day: ?04/30 0701 - 05/01 0700 ?In: 2402.9 [I.V.:1442.9; IV [id number]:595] ?Out: 1595 [Urine:1075; Drains:520] ?Intake/Output this shift: ?No intake/output data recorded. ? ?GI: tender ruq and epigastrium with guarding, RUQ drain to bulb suction draining bile, no peritonitis, abdomen is soft  ? ?Lab Results:  ?Recent Labs  ?  04/26/22 ?0103 04/27/22 ?6387  ?WBC 27.9* 15.7*  ?HGB 14.1 12.0*  ?HCT 41.3 37.1*  ?PLT 193 169  ? ?BMET ?Recent Labs  ?  04/26/22 ?0103 04/27/22 ?0733  ?NA 135 138  ?K 4.2 3.9  ?CL 105 104  ?CO2 21* 26  ?GLUCOSE 157* 159*  ?BUN 16 24*  ?CREATININE 0.94 1.09  ?CALCIUM 9.0 8.7*  ? ?PT/INR ?Recent Labs  ?  04/26/22 ?0843  ?LABPROT 16.0*  ?INR 1.3*  ? ?ABG ?No results for input(s): PHART, HCO3 in the last 72 hours. ? ?Invalid input(s): PCO2, PO2 ? ?Studies/Results: ?CT ABDOMEN PELVIS W CONTRAST ? ?Result Date: 04/25/2022 ?CLINICAL DATA:  Abdominal pain, acute, nonlocalized EXAM: CT ABDOMEN AND PELVIS WITH CONTRAST TECHNIQUE: Multidetector CT imaging of the abdomen and pelvis was performed using the standard protocol following bolus administration of intravenous contrast. RADIATION DOSE REDUCTION: This exam was performed according to the departmental dose-optimization program which includes automated exposure control, adjustment of the mA and/or kV according to patient size and/or use of iterative reconstruction technique. CONTRAST:  175m OMNIPAQUE IOHEXOL 300 MG/ML  SOLN COMPARISON:  None. FINDINGS: Lower chest: Bibasilar atelectasis.  Hepatobiliary: No focal liver abnormality. 1.3 cm gallstone at the neck. Mild pericholecystic fat infiltration. 5 mm stone within the mid common bile duct. There is mild proximal biliary dilatation. Pancreas: Unremarkable. Spleen: Unremarkable. Adrenals/Urinary Tract: Adrenals are unremarkable. No hydronephrosis or renal calculi. Circumferential bladder wall thickening probably due to chronic outlet obstruction. Stomach/Bowel: Stomach is within normal limits. Bowel is normal in caliber. A duodenal diverticulum is noted. Moderate stool throughout the colon. Vascular/Lymphatic: Atherosclerosis. 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 Reproductive: Enlarged prostate. Other: No free fluid.  No acute abnormality of the abdominal wall. Musculoskeletal: Degenerative changes of the included spine. IMPRESSION: 5 mm stone within the mid common bile duct with mild proximal biliary dilatation. There is also a 1.3 cm gallstone at the neck with mild pericholecystic fat infiltration raising the possibility of acute cholecystitis. 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 This may represent a small increase since the 2021 ultrasound. Recommend follow-up every 12 months and vascular consultation. 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 Electronically Signed   By: PMacy MisM.D.   On: 04/25/2022 12:31  ? ?IR Perc Cholecystostomy ? ?Result Date: 04/26/2022 ?INDICATION: Acute cholecystitis EXAM: ULTRASOUND AND FLUOROSCOPIC-GUIDED CHOLECYSTOSTOMY TUBE PLACEMENT COMPARISON:  CT AP, 04/25/2022 MEDICATIONS: Antibiotics were administered within an appropriate time frame prior to skin puncture. ANESTHESIA/SEDATION: Local anesthetic and single agent sedation was employed during this procedure. A total of fentanyl 25 mcg was administered intravenously. The  patient's level  of consciousness and vital signs were monitored continuously by radiology nursing throughout the procedure under my direct supervision. CONTRAST:  39m OMNIPAQUE IOHEXOL 300 MG/ML SOLN - administered into the gallbladder fossa. FLUOROSCOPY TIME:  Fluoroscopic dose; 6.7 mGy COMPLICATIONS: None immediate. PROCEDURE: Informed written consent was obtained from the patient after a discussion of the risks, benefits and alternatives to treatment. Questions regarding the procedure were encouraged and answered. A timeout was performed prior to the initiation of the procedure. The right upper abdominal quadrant was prepped and draped in the usual sterile fashion, and a sterile drape was applied covering the operative field. Maximum barrier sterile technique with sterile gowns and gloves were used for the procedure. A timeout was performed prior to the initiation of the procedure. Local anesthesia was provided with 1% lidocaine with epinephrine. Ultrasound scanning of the right upper quadrant demonstrates a markedly dilated gallbladder. Of note, the patient reported pain with ultrasound imaging over the gallbladder. Utilizing a transhepatic approach, a 18 gauge, 15 cm trocar needle was advanced into the gallbladder under direct ultrasound guidance. An ultrasound image was saved for documentation purposes. Appropriate intraluminal puncture was confirmed with the efflux of bile and advancement of an 0.018 wire into the gallbladder lumen. The needle was exchanged for an A[place]. A small amount of contrast was injected to confirm appropriate intraluminal positioning. Over a BCareers adviser a 10 Fr Cook cholecystomy tube was advanced into the gallbladder fossa, coiled and locked. Bile was aspirated and a small amount of contrast was injected as several post procedural spot radiographic images were obtained in various obliquities. The catheter was secured to the skin with suture, connected to a drainage bag and a  dressing was placed. The patient tolerated the procedure well without immediate post procedural complication. IMPRESSION: Successful placement of a 10 Fr cholecystostomy drainage catheter, as above. PLAN: The patient is to return to V[facility] Inter[facility]iology (VIR) for routine catheter evaluation and exchange in 6 weeks. JMichaelle Birks MD [facility] Electronically Signed   By: JMichaelle BirksM.D.   On: 04/26/2022 11:50  ? ?DG Chest Portable 1 View ? ?Result Date: 04/25/2022 ?CLINICAL DATA:  Generalized weakness, RIGHT upper quadrant pain EXAM: PORTABLE CHEST 1 VIEW COMPARISON:  Portable exam 1119 hours compared to 10/02/2015 FINDINGS: Normal heart size, mediastinal contours, and pulmonary vascularity. Bibasilar atelectasis. Lungs otherwise clear. No infiltrate, pleural effusion, or pneumothorax. IMPRESSION: Bibasilar atelectasis. Electronically Signed   By: MLavonia DanaM.D.   On: 04/25/2022 11:53  ? ?ECHOCARDIOGRAM COMPLETE ? ?Result Date: 04/26/2022 ?   ECHOCARDIOGRAM REPORT   Patient Name:   GCarlitos Hurst Date of Exam: 04/26/2022 Medical Rec #:  [id number]         Height:       74.0 in Accession #:    [id number]        Weight:       186.9 lb Date of Birth:  [birth year]        BSA:          2.112 m? Patient Age:    89years           BP:           92/62 mmHg Patient Gender: M                  HR:           76 bpm. Exam Location:  Inpatient Procedure: 2D Echo, Cardiac  Doppler and Color Doppler Indications:    Abnl ECG  History:        Patient has no prior history of Echocardiogram examinations.  Sonographer:    Wenda Low Referring Phys: [place]  1. Left ventricular ejection fraction, by estimation, is 40 to 45%. The left ventricle has mildly decreased function. The left ventricle demonstrates global hypokinesis. There is mild left ventricular hypertrophy. Left ventricular diastolic parameters are consistent with Grade  I diastolic dysfunction (impaired relaxation).  2. Right ventricular systolic function is normal. The right ventricular size is normal. There is normal pulmonary artery systolic pressure.  3. The mitral

## 2022-04-28 ENCOUNTER — Other Ambulatory Visit (HOSPITAL_COMMUNITY): Payer: Self-pay

## 2022-04-28 DIAGNOSIS — I5041 Acute combined systolic (congestive) and diastolic (congestive) heart failure: Secondary | ICD-10-CM

## 2022-04-28 DIAGNOSIS — K8042 Calculus of bile duct with acute cholecystitis without obstruction: Secondary | ICD-10-CM | POA: Diagnosis not present

## 2022-04-28 DIAGNOSIS — I714 Abdominal aortic aneurysm, without rupture, unspecified: Secondary | ICD-10-CM | POA: Diagnosis not present

## 2022-04-28 DIAGNOSIS — K8000 Calculus of gallbladder with acute cholecystitis without obstruction: Secondary | ICD-10-CM | POA: Diagnosis not present

## 2022-04-28 DIAGNOSIS — I10 Essential (primary) hypertension: Secondary | ICD-10-CM | POA: Diagnosis not present

## 2022-04-28 DIAGNOSIS — I480 Paroxysmal atrial fibrillation: Secondary | ICD-10-CM | POA: Diagnosis not present

## 2022-04-28 DIAGNOSIS — E1142 Type 2 diabetes mellitus with diabetic polyneuropathy: Secondary | ICD-10-CM | POA: Diagnosis not present

## 2022-04-28 LAB — CBC
HCT: 38.4 % — ABNORMAL LOW (ref 39.0–52.0)
Hemoglobin: 12.8 g/dL — ABNORMAL LOW (ref 13.0–17.0)
MCH: 31.1 pg (ref 26.0–34.0)
MCHC: 33.3 g/dL (ref 30.0–36.0)
MCV: 93.2 fL (ref 80.0–100.0)
Platelets: 180 10*3/uL (ref 150–400)
RBC: 4.12 MIL/uL — ABNORMAL LOW (ref 4.22–5.81)
RDW: 14 % (ref 11.5–15.5)
WBC: 14 10*3/uL — ABNORMAL HIGH (ref 4.0–10.5)
nRBC: 0 % (ref 0.0–0.2)

## 2022-04-28 LAB — MAGNESIUM: Magnesium: 1.8 mg/dL (ref 1.7–2.4)

## 2022-04-28 LAB — HEPARIN LEVEL (UNFRACTIONATED)
Heparin Unfractionated: 0.12 IU/mL — ABNORMAL LOW (ref 0.30–0.70)
Heparin Unfractionated: 0.3 IU/mL (ref 0.30–0.70)

## 2022-04-28 LAB — COMPREHENSIVE METABOLIC PANEL
ALT: 16 U/L (ref 0–44)
AST: 19 U/L (ref 15–41)
Albumin: 2.3 g/dL — ABNORMAL LOW (ref 3.5–5.0)
Alkaline Phosphatase: 40 U/L (ref 38–126)
Anion gap: 9 (ref 5–15)
BUN: 21 mg/dL (ref 8–23)
CO2: 22 mmol/L (ref 22–32)
Calcium: 8.6 mg/dL — ABNORMAL LOW (ref 8.9–10.3)
Chloride: 105 mmol/L (ref 98–111)
Creatinine, Ser: 1.05 mg/dL (ref 0.61–1.24)
GFR, Estimated: >60 mL/min (ref >60)
Glucose, Bld: 171 mg/dL — ABNORMAL HIGH (ref 70–99)
Potassium: 3.7 mmol/L (ref 3.5–5.1)
Sodium: 136 mmol/L (ref 135–145)
Total Bilirubin: 0.7 mg/dL (ref 0.3–1.2)
Total Protein: 5.6 g/dL — ABNORMAL LOW (ref 6.5–8.1)

## 2022-04-28 LAB — CULTURE, BLOOD (ROUTINE X 2)

## 2022-04-28 LAB — GLUCOSE, CAPILLARY
Glucose-Capillary: 121 mg/dL — ABNORMAL HIGH (ref 70–99)
Glucose-Capillary: 127 mg/dL — ABNORMAL HIGH (ref 70–99)
Glucose-Capillary: 128 mg/dL — ABNORMAL HIGH (ref 70–99)
Glucose-Capillary: 133 mg/dL — ABNORMAL HIGH (ref 70–99)
Glucose-Capillary: 139 mg/dL — ABNORMAL HIGH (ref 70–99)
Glucose-Capillary: 150 mg/dL — ABNORMAL HIGH (ref 70–99)

## 2022-04-28 LAB — TSH: TSH: 1.058 u[IU]/mL (ref 0.350–4.500)

## 2022-04-28 MED ORDER — SODIUM CHLORIDE 0.9 % IV SOLN: 2.0000 g | INTRAVENOUS | Status: DC

## 2022-04-28 MED ORDER — AMIODARONE HCL IN DEXTROSE 360-4.14 MG/200ML-% IV SOLN
30.0000 mg/h | INTRAVENOUS | Status: DC
Start: 2022-04-28 — End: 2022-04-29
  Administered 2022-04-28 – 2022-04-29 (×3): 30 mg/h via INTRAVENOUS
  Filled 2022-04-28 (×3): qty 200

## 2022-04-28 MED ORDER — CEFAZOLIN SODIUM-DEXTROSE 1-4 GM/50ML-% IV SOLN
1.0000 g | Freq: Three times a day (TID) | INTRAVENOUS | Status: DC
  Administered 2022-04-28 – 2022-04-30 (×7): 1 g via INTRAVENOUS
  Filled 2022-04-28 (×7): qty 50

## 2022-04-28 MED ORDER — HEPARIN (PORCINE) 25000 UT/250ML-% IV SOLN
1800.0000 [IU]/h | INTRAVENOUS | Status: DC
  Administered 2022-04-28: 1600 [IU]/h via INTRAVENOUS
  Administered 2022-04-28: 1300 [IU]/h via INTRAVENOUS
  Administered 2022-04-29: 1800 [IU]/h via INTRAVENOUS
  Filled 2022-04-28 (×4): qty 250

## 2022-04-28 MED ORDER — MAGNESIUM SULFATE 2 GM/50ML IV SOLN
2.0000 g | Freq: Once | INTRAVENOUS | Status: AC
  Administered 2022-04-28: 2 g via INTRAVENOUS
  Filled 2022-04-28: qty 50

## 2022-04-28 MED ORDER — POTASSIUM CHLORIDE CRYS ER 20 MEQ PO TBCR
40.0000 meq | EXTENDED_RELEASE_TABLET | Freq: Once | ORAL | Status: AC
Start: 2022-04-28 — End: 2022-04-28
  Administered 2022-04-28: 40 meq via ORAL
  Filled 2022-04-28: qty 2

## 2022-04-28 MED ORDER — AMIODARONE HCL IN DEXTROSE 360-4.14 MG/200ML-% IV SOLN
60.0000 mg/h | INTRAVENOUS | Status: AC
  Administered 2022-04-28 (×2): 60 mg/h via INTRAVENOUS
  Filled 2022-04-28 (×2): qty 200

## 2022-04-28 MED ORDER — AMIODARONE LOAD VIA INFUSION
150.0000 mg | Freq: Once | INTRAVENOUS | Status: AC
  Administered 2022-04-28: 150 mg via INTRAVENOUS
  Filled 2022-04-28: qty 83.34

## 2022-04-28 NOTE — Progress Notes (Signed)
?Progress Note ? ?Patient: Douglas Hurst. WCH:[id number] DOB: Jun 25, 1941  ?DOA: 04/25/2022  DOS: 04/28/2022  ?  ?Brief hospital course: ?Douglas Hurst. is a 81 y.o. male with a history of COPD, HTN, HLD, T2DM, BPH, GERD, AAA, chronic back pain and bipolar disorder who presented to the ED 4/29 with RUQ pain for a couple days and weakness causing fall and inability to get back up. He was afebrile with leukocytosis to 27.9k and CT suggestive of acute cholecystitis with 1.3cm gallstone in the neck of the gallbladder and 66m CBD stone with proximal biliary dilatation. He was given IVF, zosyn, and admitted for further management. General surgery and GI have recommended cholecystostomy tube for management which has been performed 4/30. Developed new onset AFib with RVR and depressed LVEF found on echocardiogram for which cardiology consulted. Initially treated with metoprolol, has had hypotension for which amiodarone infusion was started. IV heparin also initiated with recurrence of AFib.  ? ?Assessment and Plan: ?Sepsis due to acute calculus cholecystitis: ?- s/p cholecystostomy 4/30, continue routine drain care/flushed and output monitoring. Will follow up with IR for cholangiogram after discharge. If still choledocholithiasis, would need ERCP. Will need eventual lap cholecystectomy per surgery. LFTs remain reassuring. ?- Based on pansensitivity of E. coli in GB culture, switch to ancef per pharmacy. WBC improving, still elevated. ?- S. hominis growing in 1 of 3 blood cultures consistent with contaminant.   ? ?New onset atrial fibrillation with RVR: Precipitated by sepsis. Quickly converted with dilt gtt, remains in NSR with metoprolol.  ?- Initiated low dose metoprolol. Continue tartrate formulation due to soft BP. Started amio gtt, per cardiology planning to convert to po 5/3. Avoid CCB with LV systolic dysfunction. ?- Started heparin IV since recurrent AFib. Will no longer require outpatient cardiac monitoring.  Benefits check requested for DOACs. Monitor CBC/for bleeding.  ? ?Hypoxia: Unclear etiology. Exam reassuring. Resolved on 5/2. ?- Check CXR if recurs ? ?New diagnosis HFrEF:  ?- Euvolemic currently, no diuretic required. ?- BB as above. ?- Holding GDMT due to hypotension at this time.   ? ?T2DM: HbA1c 6.5% ?- Hold metformin ?- Give sensitive SSI, at inpatient goal.  ? ?HTN:  ?- Hold home micardis '80mg'$ , will DC norvasc.  ? ?GERD:  ?- Continue PPI ? ?HLD:  ?- Continue statin ? ?COPD: No active bronchospasm.  ?- Avoiding albuterol with no wheezing to avoid worsening RVR.  ? ?Bipolar disorder: Quiescent. ? 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 ?Subjective: Did not get much sleep at all last night with all the commotion about fast HR. Abdominal pain is stable, slightly improved. No nausea or vomiting but no appetite at all either. Requests liquid diet (cleared for soft diet). ? ?Objective: ?Vitals:  ? 04/28/22 0514 04/28/22 0521 04/28/22 0816 04/28/22 1128  ?BP:   125/74 (!) 105/57  ?Pulse:   88 75  ?Resp: '16 17 20 20  '$ ?Temp:   98.7 ?F (37.1 ?C) 98.2 ?F (36.8 ?C)  ?TempSrc:   Oral Oral  ?SpO2:   96% 94%  ?Weight:      ?Height:      ? ?Gen: Elderly male in no distress ?Pulm: Nonlabored breathing room air. Clear. ?CV: Regular this afternoon without murmur, rub, or gallop. No JVD, no dependent edema. ?GI: Abdomen soft, appropriately tender in RUQ without rebound or guarding, elsewhere non-tender, non-distended, with normoactive bowel sounds.  ?Ext: Warm, no deformities ?Skin:  No rashes, lesions or ulcers on visualized skin. ?Neuro: Alert and oriented. No focal neurological deficits. ?Psych: Judgement and insight appear fair. Mood euthymic & affect congruent. Behavior is appropriate.   ? ?Data Personally reviewed: ?CBC: ?Recent Labs  ?Lab 04/25/22 ?8938 04/26/22 ?0103 04/27/22 ?1017 04/28/22 ?0335  ?WBC 27.9* 27.9* 15.7* 14.0*  ?HGB  14.9 14.1 12.0* 12.8*  ?HCT 44.9 41.3 37.1* 38.4*  ?MCV 93.0 91.6 93.9 93.2  ?PLT 251 193 169 180  ? ?Basic Metabolic Panel: ?Recent Labs  ?Lab 04/25/22 ?5102 04/26/22 ?0103 04/27/22 ?5852 04/28/22 ?0335  ?NA 135 135 138 136  ?K 4.2 4.2 3.9 3.7  ?CL 100 105 104 105  ?CO2 22 21* 26 22  ?GLUCOSE 197* 157* 159* 171*  ?BUN 15 16 24* 21  ?CREATININE 1.02 0.94 1.09 1.05  ?CALCIUM 9.7 9.0 8.7* 8.6*  ?MG  --   --   --  1.8  ? ?GFR: ?Estimated Creatinine Clearance: 66.3 mL/min (by C-G formula based on SCr of 1.05 mg/dL). ?Liver Function Tests: ?Recent Labs  ?Lab 04/25/22 ?7782 04/26/22 ?0103 04/28/22 ?0335  ?AST '23 21 19  '$ ?ALT '21 21 16  '$ ?ALKPHOS 64 46 40  ?BILITOT 1.1 1.2 0.7  ?PROT 7.5 6.4* 5.6*  ?ALBUMIN 4.0 3.2* 2.3*  ? ?Recent Labs  ?Lab 04/25/22 ?4235  ?LIPASE 25  ? ?No results for input(s): AMMONIA in the last 168 hours. ?Coagulation Profile: ?Recent Labs  ?Lab 04/26/22 ?0843  ?INR 1.3*  ? ?Cardiac Enzymes: ?No results for input(s): CKTOTAL, CKMB, CKMBINDEX, TROPONINI in the last 168 hours. ?BNP (last 3 results) ?No results for input(s): PROBNP in the last 8760 hours. ?HbA1C: ?No results for input(s): HGBA1C in the last 72 hours. ?CBG: ?Recent Labs  ?Lab 04/27/22 ?1624 04/28/22 ?0001 04/28/22 ?0431 04/28/22 ?3614 04/28/22 ?1126  ?GLUCAP 104* 139* 150* 128* 133*  ? ?Lipid Profile: ?No results for input(s): CHOL, HDL, LDLCALC, TRIG, CHOLHDL, LDLDIRECT in the last 72 hours. ?Thyroid Function Tests: ?Recent Labs  ?  04/28/22 ?0940  ?TSH 1.058  ? ?Anemia Panel: ?No results for input(s): VITAMINB12, FOLATE, FERRITIN, TIBC, IRON, RETICCTPCT in the last 72 hours. ?Urine analysis: ?   ?Component Value Date/Time  ? COLORURINE YELLOW 04/25/2022 1232  ? APPEARANCEUR CLEAR 04/25/2022 1232  ? LABSPEC <1.005 (L) 04/25/2022 1232  ? PHURINE 6.0 04/25/2022 1232  ? GLUCOSEU 100 (A) 04/25/2022 1232  ? HGBUR TRACE (A) 04/25/2022 1232  ? [place] NEGATIVE 04/25/2022 1232  ? [place] NEGATIVE 04/25/2022 1232  ? PROTEINUR NEGATIVE  04/25/2022 1232  ? UROBILINOGEN 4.0 (H) 10/02/2015 2135  ? NITRITE POSITIVE (A) 04/25/2022 1232  ? LEUKOCYTESUR SMALL (A) 04/25/2022 1232  ? ?Recent Results (from the past 240 hour(s))  ?Culture, blood (Routine X 2) w Reflex to ID Panel     Status: None (Preliminary result)  ? Collection Time: 04/25/22  6:28 PM  ? Specimen: BLOOD  ?Result Value Ref Range Status  ? Specimen Description BLOOD RIGHT ANTECUBITAL  Final  ? Special Requests   Final  ?  BOTTLES DRAWN AEROBIC AND ANAEROBIC Blood Culture results may not be optimal due to an inadequate volume of blood received in culture bottles  ? Culture   Final  ?  NO GROWTH 2 DAYS ?Performed at [facility], [place] [address] [place], [place] [address] ?  ? Report Status PENDING  Incomplete  ?Culture, blood (Routine X 2) w Reflex to ID Panel     Status: Abnormal  ? Collection Time: 04/25/22  6:34 PM  ?  Specimen: BLOOD  ?Result Value Ref Range Status  ? Specimen Description BLOOD RIGHT ANTECUBITAL  Final  ? Special Requests   Final  ?  BOTTLES DRAWN AEROBIC ONLY Blood Culture results may not be optimal due to an inadequate volume of blood received in culture bottles  ? Culture  Setup Time   Final  ?  GRAM POSITIVE COCCI IN CLUSTERS ?AEROBIC BOTTLE ONLY ?CRITICAL RESULT CALLED TO, READ BACK BY AND VERIFIED WITH: PHARM D M.PHAM ON [id number] AT 1741 BY E.PARRISH ?  ? Culture (A)  Final  ?  STAPHYLOCOCCUS HOMINIS ?THE SIGNIFICANCE OF ISOLATING THIS ORGANISM FROM A SINGLE SET OF BLOOD CULTURES WHEN MULTIPLE SETS ARE DRAWN IS UNCERTAIN. PLEASE NOTIFY THE MICROBIOLOGY DEPARTMENT WITHIN ONE WEEK IF SPECIATION AND SENSITIVITIES ARE REQUIRED. ?Performed at [facility], [place] [address] [place], [place] [address] ?  ? Report Status 04/28/2022 FINAL  Final  ?Blood Culture ID Panel (Reflexed)     Status: Abnormal  ? Collection Time: 04/25/22  6:34 PM  ?Result Value Ref Range Status  ? Enterococcus faecalis NOT DETECTED NOT DETECTED Final  ? Enterococcus Faecium NOT DETECTED  NOT DETECTED Final  ? Listeria monocytogenes NOT DETECTED NOT DETECTED Final  ? Staphylococcus species DETECTED (A) NOT DETECTED Final  ?  Comment: CRITICAL RESULT CALLED TO, READ BACK BY AND VERIFIED WITH: ?PHA

## 2022-04-28 NOTE — TOC Benefit Eligibility Note (Signed)
Patient Advocate Encounter  Insurance verification completed.    The patient is currently admitted and upon discharge could be taking Eliquis 5 mg.  The current 30 day co-pay is, $45.00.   The patient is currently admitted and upon discharge could be taking Xarelto 20 mg.  The current 30 day co-pay is, $45.00.   The patient is insured through [employer] Medicare Part D     Karla Vines, CPhT Pharmacy Patient Advocate Specialist [facility] Patient Advocate Team Direct Number: [phone]  Fax: [phone]        

## 2022-04-28 NOTE — Progress Notes (Signed)
ANTICOAGULATION CONSULT NOTE ? ?Pharmacy Consult for heparin ?Indication: atrial fibrillation ? ?No Known Allergies ? ?Patient Measurements: ?Height: '6\' 2"'$  (188 cm) ?Weight: 84.8 kg (186 lb 15.2 oz) ?IBW/kg (Calculated) : 82.2 ?Heparin Dosing Weight: 84.8 ? ?Vital Signs: ?Temp: 98.7 ?F (37.1 ?C) (05/02 1610) ?Temp Source: Oral (05/02 9604) ?BP: 125/74 (05/02 0816) ?Pulse Rate: 88 (05/02 0816) ? ?Labs: ?Recent Labs  ?  04/26/22 ?0103 04/26/22 ?0217 04/26/22 ?5409 04/26/22 ?8119 04/27/22 ?1478 04/28/22 ?2956 04/28/22 ?0940  ?HGB 14.1  --   --   --  12.0* 12.8*  --   ?HCT 41.3  --   --   --  37.1* 38.4*  --   ?PLT 193  --   --   --  169 180  --   ?LABPROT  --   --   --  16.0*  --   --   --   ?INR  --   --   --  1.3*  --   --   --   ?HEPARINUNFRC  --   --   --   --   --   --  0.12*  ?CREATININE 0.94  --   --   --  1.09 1.05  --   ?TROPONINIHS  --  24* 26*  --   --   --   --   ? ? ?Estimated Creatinine Clearance: 66.3 mL/min (by C-G formula based on SCr of 1.05 mg/dL). ? ?Medical History: ?Past Medical History:  ?Diagnosis Date  ? Bipolar disorder ([place])   ? Chronic back pain   ? COPD (chronic obstructive pulmonary disease) ([place])   ? Dr. Laurann [place]  ? Diverticulosis   ? ED (erectile dysfunction)   ? Hiatal hernia   ? Hyperlipidemia   ? Hypertension   ? ? ?Medications:  ?Medications Prior to Admission  ?Medication Sig Dispense Refill Last Dose  ? amLODipine (NORVASC) 5 MG tablet Take 5 mg by mouth daily.   04/24/2022  ? aspirin EC 81 MG tablet Take 81 mg by mouth daily. Swallow whole.   04/24/2022  ? atorvastatin (LIPITOR) 40 MG tablet Take 40 mg by mouth daily.   04/24/2022  ? metFORMIN (GLUCOPHAGE-XR) 500 MG 24 hr tablet Take 500-1,000 mg by mouth See admin instructions. Take 2 tablets (1000 mg) in the morning and 1 tablet (500 mg) at night   04/24/2022  ? Multiple Vitamin (MULTIVITAMIN) tablet Take 1 tablet by mouth daily.   04/24/2022  ? pantoprazole (PROTONIX) 40 MG tablet Take 40 mg by mouth daily.   04/24/2022  ? tadalafil  (CIALIS) 20 MG tablet Take 20 mg by mouth every three (3) days as needed for erectile dysfunction.   Past Month  ? telmisartan (MICARDIS) 80 MG tablet Take 80 mg by mouth daily.   04/24/2022  ? ? ?Assessment: Douglas Klutz. is a 81 y.o. male s/p cholecystostomy 4/30 with drain still in place, now with new onset atrial fibrillation with RVR. Hgb 12.0, PLT 169, CrCl ~64 mL/min, Pharmacy consulted to start heparin infusion. ? ?Initial heparin level is subtherapeutic at 0.12. No infusion issues or bleeding per RN. CBC ordered for tomorrow. ? ? ?Goal of Therapy:  ?Heparin level 0.3-0.7 units/ml ?Monitor platelets by anticoagulation protocol: Yes ?  ?Plan:  ?Increase heparin infusion to 1600 units/hr ?Check anti-Xa level in 8 hours and daily while on heparin ?Continue to monitor H&H and platelets ?F/u [facility] plans for discharge ? ? ?Thank you for allowing [place] to participate in  this patients care. ?Jens Som, PharmD ?04/28/2022 10:44 AM ? ?**Pharmacist phone directory can be found on [email or web address] listed under [place]** ? ?

## 2022-04-28 NOTE — Progress Notes (Signed)
? ?Progress Note ? ?Patient Name: Douglas Hurst. ?Date of Encounter: 04/28/2022 ? ?Primary Cardiologist: Skeet Latch, MD ? ?Subjective  ? ?Feeling so/so today - mild abdominal pain, sort of puny. No chest pain or SOB. ? ?Inpatient Medications  ?  ?Scheduled Meds: ? atorvastatin  40 mg Oral Daily  ? insulin aspart  0-9 Units Subcutaneous TID WC  ? metoprolol tartrate  25 mg Oral BID  ? pantoprazole (PROTONIX) IV  40 mg Intravenous Q24H  ? sodium chloride flush  5 mL Intracatheter Q8H  ? ?Continuous Infusions: ? amiodarone 30 mg/hr (04/28/22 6962)  ? heparin 1,300 Units/hr (04/28/22 0137)  ? piperacillin-tazobactam (ZOSYN)  IV 3.375 g (04/28/22 0449)  ? ?PRN Meds: ?acetaminophen **OR** acetaminophen, alum & mag hydroxide-simeth, HYDROmorphone (DILAUDID) injection, metoprolol tartrate, nitroGLYCERIN, ondansetron **OR** ondansetron (ZOFRAN) IV  ? ?Vital Signs  ?  ?Vitals:  ? 04/28/22 0310 04/28/22 9528 04/28/22 4132 04/28/22 0816  ?BP: 123/78   125/74  ?Pulse:    88  ?Resp: (!) '23 16 17 20  '$ ?Temp:    98.7 ?F (37.1 ?C)  ?TempSrc:    Oral  ?SpO2:    96%  ?Weight:      ?Height:      ? ? ?Intake/Output Summary (Last 24 hours) at 04/28/2022 0914 ?Last data filed at 04/28/2022 4401 ?Gross per 24 hour  ?Intake 697.01 ml  ?Output 1700 ml  ?Net -1002.99 ml  ? ? ?  04/25/2022  ?  5:34 PM 09/01/2016  ?  6:36 AM 10/02/2015  ?  7:43 PM  ?Last 3 Weights  ?Weight (lbs) 186 lb 15.2 oz 208 lb 208 lb 14.4 oz  ?Weight (kg) 84.8 kg 94.348 kg 94.756 kg  ?  ? ?Telemetry  ?  ?Paroxysms of atrial fib/SVT (suspect atrial flutter), also NSR with PACs - Personally Reviewed ? ?ECG  ?  ?atrial fib 145bpm at beginning of EKG then conversion to NSR, nonspecific STT changes - Personally Reviewed ? ?Physical Exam  ? ?GEN: No acute distress.  ?HEENT: Normocephalic, atraumatic, sclera non-icteric. ?Neck: No JVD or bruits. ?Cardiac: RRR no murmurs, rubs, or gallops.  ?Respiratory: Mildly decreased BS right base otherwise clear to auscultation bilaterally.  Breathing is unlabored. ?GI: Soft, nontender, non-distended, BS +x 4. ?MS: no deformity. ?Extremities: No clubbing or cyanosis. No edema. Distal pedal pulses are 2+ and equal bilaterally. ?Neuro:  AAOx3. Follows commands. ?Psych:  Responds to questions appropriately with a normal affect. ? ?Labs  ?  ?High Sensitivity Troponin:   ?Recent Labs  ?Lab 04/26/22 ?0217 04/26/22 ?0272  ?TROPONINIHS 24* 26*  ?   ? ?Cardiac EnzymesNo results for input(s): TROPONINI in the last 168 hours. No results for input(s): TROPIPOC in the last 168 hours.  ? ?Chemistry ?Recent Labs  ?Lab 04/25/22 ?5366 04/26/22 ?0103 04/27/22 ?4403 04/28/22 ?0335  ?NA 135 135 138 136  ?K 4.2 4.2 3.9 3.7  ?CL 100 105 104 105  ?CO2 22 21* 26 22  ?GLUCOSE 197* 157* 159* 171*  ?BUN 15 16 24* 21  ?CREATININE 1.02 0.94 1.09 1.05  ?CALCIUM 9.7 9.0 8.7* 8.6*  ?PROT 7.5 6.4*  --  5.6*  ?ALBUMIN 4.0 3.2*  --  2.3*  ?AST 23 21  --  19  ?ALT 21 21  --  16  ?ALKPHOS 64 46  --  40  ?BILITOT 1.1 1.2  --  0.7  ?GFRNONAA >60 >60 >60 >60  ?ANIONGAP '13 9 8 9  '$ ?  ? ?Hematology ?Recent Labs  ?  Lab 04/26/22 ?0103 04/27/22 ?1287 04/28/22 ?0335  ?WBC 27.9* 15.7* 14.0*  ?RBC 4.51 3.95* 4.12*  ?HGB 14.1 12.0* 12.8*  ?HCT 41.3 37.1* 38.4*  ?MCV 91.6 93.9 93.2  ?MCH 31.3 30.4 31.1  ?MCHC 34.1 32.3 33.3  ?RDW 14.4 14.2 14.0  ?PLT 193 169 180  ? ? ?BNPNo results for input(s): BNP, PROBNP in the last 168 hours.  ? ?DDimer No results for input(s): DDIMER in the last 168 hours.  ? ?Radiology  ?  ?IR Perc Cholecystostomy ? ?Result Date: 04/26/2022 ?INDICATION: Acute cholecystitis EXAM: ULTRASOUND AND FLUOROSCOPIC-GUIDED CHOLECYSTOSTOMY TUBE PLACEMENT COMPARISON:  CT AP, 04/25/2022 MEDICATIONS: Antibiotics were administered within an appropriate time frame prior to skin puncture. ANESTHESIA/SEDATION: Local anesthetic and single agent sedation was employed during this procedure. A total of fentanyl 25 mcg was administered intravenously. The patient's level of consciousness and vital signs  were monitored continuously by radiology nursing throughout the procedure under my direct supervision. CONTRAST:  58m OMNIPAQUE IOHEXOL 300 MG/ML SOLN - administered into the gallbladder fossa. FLUOROSCOPY TIME:  Fluoroscopic dose; 6.7 mGy COMPLICATIONS: None immediate. PROCEDURE: Informed written consent was obtained from the patient after a discussion of the risks, benefits and alternatives to treatment. Questions regarding the procedure were encouraged and answered. A timeout was performed prior to the initiation of the procedure. The right upper abdominal quadrant was prepped and draped in the usual sterile fashion, and a sterile drape was applied covering the operative field. Maximum barrier sterile technique with sterile gowns and gloves were used for the procedure. A timeout was performed prior to the initiation of the procedure. Local anesthesia was provided with 1% lidocaine with epinephrine. Ultrasound scanning of the right upper quadrant demonstrates a markedly dilated gallbladder. Of note, the patient reported pain with ultrasound imaging over the gallbladder. Utilizing a transhepatic approach, a 18 gauge, 15 cm trocar needle was advanced into the gallbladder under direct ultrasound guidance. An ultrasound image was saved for documentation purposes. Appropriate intraluminal puncture was confirmed with the efflux of bile and advancement of an 0.018 wire into the gallbladder lumen. The needle was exchanged for an [place]. A small amount of contrast was injected to confirm appropriate intraluminal positioning. Over a BCareers adviser a 10 Fr Cook cholecystomy tube was advanced into the gallbladder fossa, coiled and locked. Bile was aspirated and a small amount of contrast was injected as several post procedural spot radiographic images were obtained in various obliquities. The catheter was secured to the skin with suture, connected to a drainage bag and a dressing was placed. The patient tolerated the  procedure well without immediate post procedural complication. IMPRESSION: Successful placement of a 10 Fr cholecystostomy drainage catheter, as above. PLAN: The patient is to return to Vascular Interventional Radiology (VIR) for routine catheter evaluation and exchange in 6 weeks. JMichaelle Birks MD [facility] Electronically Signed   By: JMichaelle BirksM.D.   On: 04/26/2022 11:50  ? ?ECHOCARDIOGRAM COMPLETE ? ?Result Date: 04/26/2022 ?   ECHOCARDIOGRAM REPORT   Patient Name:   GDeforrest Bogle Date of Exam: 04/26/2022 Medical Rec #:  [id number]         Height:       74.0 in Accession #:    [id number]        Weight:       186.9 lb Date of Birth:  [birth year]        BSA:  2.112 m? Patient Age:    50 years           BP:           92/62 mmHg Patient Gender: M                  HR:           76 bpm. Exam Location:  Inpatient Procedure: 2D Echo, Cardiac Doppler and Color Doppler Indications:    Abnl ECG  History:        Patient has no prior history of Echocardiogram examinations.  Sonographer:    Wenda Low Referring Phys: [place]  1. Left ventricular ejection fraction, by estimation, is 40 to 45%. The left ventricle has mildly decreased function. The left ventricle demonstrates global hypokinesis. There is mild left ventricular hypertrophy. Left ventricular diastolic parameters are consistent with Grade I diastolic dysfunction (impaired relaxation).  2. Right ventricular systolic function is normal. The right ventricular size is normal. There is normal pulmonary artery systolic pressure.  3. The mitral valve is abnormal. Mild mitral valve regurgitation. No evidence of mitral stenosis. Moderate mitral annular calcification.  4. The tricuspid valve is abnormal. Tricuspid valve regurgitation is mild to moderate.  5. The aortic valve is tricuspid. There is moderate calcification of the aortic valve. There is moderate thickening of  the aortic valve. Aortic valve regurgitation is not visualized. No aortic stenosis is present.  6. The inferior vena cava is dilated in size with >50% respiratory variability, suggesting right atrial pre

## 2022-04-28 NOTE — Progress Notes (Signed)
ANTICOAGULATION CONSULT NOTE ? ?Pharmacy Consult for heparin ?Indication: atrial fibrillation ? ?No Known Allergies ? ?Patient Measurements: ?Height: '6\' 2"'$  (188 cm) ?Weight: 84.8 kg (186 lb 15.2 oz) ?IBW/kg (Calculated) : 82.2 ?Heparin Dosing Weight: 84.8 ? ?Vital Signs: ?Temp: 97.5 ?F (36.4 ?C) (05/02 1921) ?Temp Source: Oral (05/02 1921) ?BP: 132/78 (05/02 1921) ?Pulse Rate: 88 (05/02 1921) ? ?Labs: ?Recent Labs  ?  04/26/22 ?0103 04/26/22 ?0217 04/26/22 ?4709 04/26/22 ?6283 04/27/22 ?6629 04/28/22 ?4765 04/28/22 ?4650 04/28/22 ?1849  ?HGB 14.1  --   --   --  12.0* 12.8*  --   --   ?HCT 41.3  --   --   --  37.1* 38.4*  --   --   ?PLT 193  --   --   --  169 180  --   --   ?LABPROT  --   --   --  16.0*  --   --   --   --   ?INR  --   --   --  1.3*  --   --   --   --   ?HEPARINUNFRC  --   --   --   --   --   --  0.12* 0.30  ?CREATININE 0.94  --   --   --  1.09 1.05  --   --   ?TROPONINIHS  --  24* 26*  --   --   --   --   --   ? ? ?Estimated Creatinine Clearance: 66.3 mL/min (by C-G formula based on SCr of 1.05 mg/dL). ? ?Medical History: ?Past Medical History:  ?Diagnosis Date  ? Bipolar disorder ([place])   ? Chronic back pain   ? COPD (chronic obstructive pulmonary disease) ([place])   ? Dr. Laurann Montana  ? Diverticulosis   ? ED (erectile dysfunction)   ? Hiatal hernia   ? Hyperlipidemia   ? Hypertension   ? ? ?Medications:  ?Medications Prior to Admission  ?Medication Sig Dispense Refill Last Dose  ? amLODipine (NORVASC) 5 MG tablet Take 5 mg by mouth daily.   04/24/2022  ? aspirin EC 81 MG tablet Take 81 mg by mouth daily. Swallow whole.   04/24/2022  ? atorvastatin (LIPITOR) 40 MG tablet Take 40 mg by mouth daily.   04/24/2022  ? metFORMIN (GLUCOPHAGE-XR) 500 MG 24 hr tablet Take 500-1,000 mg by mouth See admin instructions. Take 2 tablets (1000 mg) in the morning and 1 tablet (500 mg) at night   04/24/2022  ? Multiple Vitamin (MULTIVITAMIN) tablet Take 1 tablet by mouth daily.   04/24/2022  ? pantoprazole (PROTONIX) 40 MG  tablet Take 40 mg by mouth daily.   04/24/2022  ? tadalafil (CIALIS) 20 MG tablet Take 20 mg by mouth every three (3) days as needed for erectile dysfunction.   Past Month  ? telmisartan (MICARDIS) 80 MG tablet Take 80 mg by mouth daily.   04/24/2022  ? ? ?Assessment: Douglas Hurst. is a 81 y.o. male s/p cholecystostomy 4/30 with drain still in place, now with new onset atrial fibrillation with RVR. Hgb 12.0, PLT 169, CrCl ~64 mL/min, Pharmacy consulted to start heparin infusion. ? ?heparin level is therapeutic at 0.3. No infusion issues or bleeding per RN. CBC ordered for tomorrow. ? ? ?Goal of Therapy:  ?Heparin level 0.3-0.7 units/ml ?Monitor platelets by anticoagulation protocol: Yes ?  ?Plan:  ?Continue heparin infusion to 1600 units/hr ?Check anti-Xa level with am labs and daily while  on heparin ?Continue to monitor H&H and platelets ?F/u [facility] plans for discharge ? ? ?Thank you for allowing [place] to participate in this patients care. ? ?Alanda Slim, PharmD, FCCM ?Clinical Pharmacist ?Please see AMION for all Pharmacists' Contact Phone Numbers ?04/28/2022, 8:07 PM  ? ?

## 2022-04-28 NOTE — Progress Notes (Signed)
?  X-cover Note: ?Asked to perform bedside evaluation. Pt here for acute cholecystitis. S/p perc cholecystostomy tube. Briefly in rapid afib.converted back to NSR. No prior hx of afib. ? ?Echo showed LVEF 40%. ? ?Went back into rapid afib this evening. Repeated doses of IV lopressor no effective in controlling HR. Pt is not on anticoagulation at this time. ? ?Pt without CP or SOB ?Lung sounds are clear. ? ?In light of is reduced LVEF, IV cardizem gtts not a great choice. ? ?Will start IV amiodarone as well as IV heparin. ? ?Check BMP and Mg. Keep serum K >4 and Mg >2 ? ? ?Kristopher Oppenheim, DO ?[facility] ? ?

## 2022-04-28 NOTE — Progress Notes (Signed)
ANTICOAGULATION CONSULT NOTE - Initial Consult ? ?Pharmacy Consult for heparin ?Indication: atrial fibrillation ? ?No Known Allergies ? ?Patient Measurements: ?Height: '6\' 2"'$  (188 cm) ?Weight: 84.8 kg (186 lb 15.2 oz) ?IBW/kg (Calculated) : 82.2 ?Heparin Dosing Weight: 84.8 ? ?Vital Signs: ?Temp: 98.5 ?F (36.9 ?C) (05/01 2200) ?Temp Source: Oral (05/01 2200) ?BP: 122/76 (05/01 2200) ?Pulse Rate: 137 (05/01 2340) ? ?Labs: ?Recent Labs  ?  04/25/22 ?0954 04/26/22 ?0103 04/26/22 ?0217 04/26/22 ?6568 04/26/22 ?1275 04/27/22 ?1700  ?HGB 14.9 14.1  --   --   --  12.0*  ?HCT 44.9 41.3  --   --   --  37.1*  ?PLT 251 193  --   --   --  169  ?LABPROT  --   --   --   --  16.0*  --   ?INR  --   --   --   --  1.3*  --   ?CREATININE 1.02 0.94  --   --   --  1.09  ?TROPONINIHS  --   --  24* 26*  --   --   ? ?Estimated Creatinine Clearance: 63.9 mL/min (by C-G formula based on SCr of 1.09 mg/dL). ? ?Medical History: ?Past Medical History:  ?Diagnosis Date  ? Bipolar disorder ([place])   ? Chronic back pain   ? COPD (chronic obstructive pulmonary disease) ([place])   ? Dr. Laurann Montana  ? Diverticulosis   ? ED (erectile dysfunction)   ? Hiatal hernia   ? Hyperlipidemia   ? Hypertension   ? ? ?Medications:  ?Medications Prior to Admission  ?Medication Sig Dispense Refill Last Dose  ? amLODipine (NORVASC) 5 MG tablet Take 5 mg by mouth daily.   04/24/2022  ? aspirin EC 81 MG tablet Take 81 mg by mouth daily. Swallow whole.   04/24/2022  ? atorvastatin (LIPITOR) 40 MG tablet Take 40 mg by mouth daily.   04/24/2022  ? metFORMIN (GLUCOPHAGE-XR) 500 MG 24 hr tablet Take 500-1,000 mg by mouth See admin instructions. Take 2 tablets (1000 mg) in the morning and 1 tablet (500 mg) at night   04/24/2022  ? Multiple Vitamin (MULTIVITAMIN) tablet Take 1 tablet by mouth daily.   04/24/2022  ? pantoprazole (PROTONIX) 40 MG tablet Take 40 mg by mouth daily.   04/24/2022  ? tadalafil (CIALIS) 20 MG tablet Take 20 mg by mouth every three (3) days as needed for erectile  dysfunction.   Past Month  ? telmisartan (MICARDIS) 80 MG tablet Take 80 mg by mouth daily.   04/24/2022  ? ? ?Assessment: Douglas Hurst. is a 81 y.o. male s/p cholecystostomy 4/30 with drain still in place, now with new onset atrial fibrillation with RVR. Hgb 12.0, PLT 169, CrCl ~64 mL/min, Pharmacy to start heparin infusion. ? ? ?Goal of Therapy:  ?Heparin level 0.3-0.7 units/ml ?Monitor platelets by anticoagulation protocol: Yes ?  ?Plan:  ?Start heparin infusion at 1300 units/hr ?Check anti-Xa level in 8 hours and daily while on heparin ?Continue to monitor H&H and platelets ? ?Georga Bora, PharmD ?Clinical Pharmacist ?04/28/2022 1:10 AM ?Please check AMION for all [place] numbers ? ? ? ?

## 2022-04-28 NOTE — Progress Notes (Signed)
? ? ?Referring Physician(s): Louanna Raw, MD ? ?Supervising Physician: Corrie Mckusick ? ?Patient Status:  [facility] - In-pt ? ?Chief Complaint: ? ?Acute cholecystitis with choledocholithiasis ? ?Subjective: ? ?Patient laying in bed, NAD. Spouse at bedside.  ?Denies abdominal pain, fever, chills, N/V.  ? ?Allergies: ?Patient has no known allergies. ? ?Medications: ?Prior to Admission medications   ?Medication Sig Start Date End Date Taking? Authorizing Provider  ?amLODipine (NORVASC) 5 MG tablet Take 5 mg by mouth daily. 03/31/22  Yes [provider]  ?aspirin EC 81 MG tablet Take 81 mg by mouth daily. Swallow whole.   Yes [provider]  ?atorvastatin (LIPITOR) 40 MG tablet Take 40 mg by mouth daily.   Yes [provider]  ?metFORMIN (GLUCOPHAGE-XR) 500 MG 24 hr tablet Take 500-1,000 mg by mouth See admin instructions. Take 2 tablets (1000 mg) in the morning and 1 tablet (500 mg) at night 03/17/22  Yes [provider]  ?Multiple Vitamin (MULTIVITAMIN) tablet Take 1 tablet by mouth daily.   Yes [provider]  ?pantoprazole (PROTONIX) 40 MG tablet Take 40 mg by mouth daily.   Yes [provider]  ?tadalafil (CIALIS) 20 MG tablet Take 20 mg by mouth every three (3) days as needed for erectile dysfunction. 03/29/22  Yes [provider]  ?telmisartan (MICARDIS) 80 MG tablet Take 80 mg by mouth daily. 02/23/22  Yes [provider]  ? ? ? ?Vital Signs: ?BP (!) 105/57   Pulse 75   Temp 98.2 ?F (36.8 ?C) (Oral)   Resp 20   Ht '6\' 2"'$  (1.66 m)   Wt 186 lb 15.2 oz (84.8 kg)   SpO2 94%   BMI 24.00 kg/m?  ? ?Physical Exam ?Vitals reviewed.  ?Constitutional:   ?   Appearance: He is well-developed.  ?HENT:  ?   Head: Normocephalic and atraumatic.  ?Cardiovascular:  ?   Rate and Rhythm: Normal rate.  ?Pulmonary:  ?   Effort: Pulmonary effort is normal.  ?Abdominal:  ?   Palpations: Abdomen is soft.  ?Skin: ?   General: Skin is warm and dry.  ?   Coloration:  Skin is not cyanotic or jaundiced.  ?   Comments: Positive RUQ drain to a bulb. Site is unremarkable with no erythema, edema, tenderness, bleeding or drainage. Suture and stat lock in place. Dressing is clean, dry, and intact. 100 ml of noted in the buld. Drain aspirates and flushes well.  ? ?  ?  ?Neurological:  ?   Mental Status: He is alert and oriented to person, place, and time.  ?Psychiatric:     ?   Mood and Affect: Mood normal.     ?   Behavior: Behavior normal.  ? ? ?Imaging: ?CT ABDOMEN PELVIS W CONTRAST ? ?Result Date: 04/25/2022 ?CLINICAL DATA:  Abdominal pain, acute, nonlocalized EXAM: CT ABDOMEN AND PELVIS WITH CONTRAST TECHNIQUE: Multidetector CT imaging of the abdomen and pelvis was performed using the standard protocol following bolus administration of intravenous contrast. RADIATION DOSE REDUCTION: This exam was performed according to the departmental dose-optimization program which includes automated exposure control, adjustment of the mA and/or kV according to patient size and/or use of iterative reconstruction technique. CONTRAST:  177m OMNIPAQUE IOHEXOL 300 MG/ML  SOLN COMPARISON:  None. FINDINGS: Lower chest: Bibasilar atelectasis. Hepatobiliary: No focal liver abnormality. 1.3 cm gallstone at the neck. Mild pericholecystic fat infiltration. 5 mm stone within the mid common bile duct. There is mild proximal biliary dilatation. Pancreas: Unremarkable. Spleen: Unremarkable.  Adrenals/Urinary Tract: Adrenals are unremarkable. No hydronephrosis or renal calculi. Circumferential bladder wall thickening probably due to chronic outlet obstruction. Stomach/Bowel: Stomach is within normal limits. Bowel is normal in caliber. A duodenal diverticulum is noted. Moderate stool throughout the colon. Vascular/Lymphatic: Atherosclerosis. 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 Reproductive: Enlarged  prostate. Other: No free fluid.  No acute abnormality of the abdominal wall. Musculoskeletal: Degenerative changes of the included spine. IMPRESSION: 5 mm stone within the mid common bile duct with mild proximal biliary dilatation. There is also a 1.3 cm gallstone at the neck with mild pericholecystic fat infiltration raising the possibility of acute cholecystitis. 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 This may represent a small increase since the 2021 ultrasound. Recommend follow-up every 12 months and vascular consultation. 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 Electronically Signed   By: Macy Mis M.D.   On: 04/25/2022 12:31  ? ?IR Perc Cholecystostomy ? ?Result Date: 04/26/2022 ?INDICATION: Acute cholecystitis EXAM: ULTRASOUND AND FLUOROSCOPIC-GUIDED CHOLECYSTOSTOMY TUBE PLACEMENT COMPARISON:  CT AP, 04/25/2022 MEDICATIONS: Antibiotics were administered within an appropriate time frame prior to skin puncture. ANESTHESIA/SEDATION: Local anesthetic and single agent sedation was employed during this procedure. A total of fentanyl 25 mcg was administered intravenously. The patient's level of consciousness and vital signs were monitored continuously by radiology nursing throughout the procedure under my direct supervision. CONTRAST:  50m OMNIPAQUE IOHEXOL 300 MG/ML SOLN - administered into the gallbladder fossa. FLUOROSCOPY TIME:  Fluoroscopic dose; 6.7 mGy COMPLICATIONS: None immediate. PROCEDURE: Informed written consent was obtained from the patient after a discussion of the risks, benefits and alternatives to treatment. Questions regarding the procedure were encouraged and answered. A timeout was performed prior to the initiation of the procedure. The right upper abdominal quadrant was prepped and draped in the usual sterile fashion, and a sterile drape was applied covering the operative field. Maximum barrier sterile technique  with sterile gowns and gloves were used for the procedure. A timeout was performed prior to the initiation of the procedure. Local anesthesia was provided with 1% lidocaine with epinephrine. Ultrasound scanning of the right upper quadrant demonstrates a markedly dilated gallbladder. Of note, the patient reported pain with ultrasound imaging over the gallbladder. Utilizing a transhepatic approach, a 18 gauge, 15 cm trocar needle was advanced into the gallbladder under direct ultrasound guidance. An ultrasound image was saved for documentation purposes. Appropriate intraluminal puncture was confirmed with the efflux of bile and advancement of an 0.018 wire into the gallbladder lumen. The needle was exchanged for an [place]. A small amount of contrast was injected to confirm appropriate intraluminal positioning. Over a BCareers adviser a 10 Fr Cook cholecystomy tube was advanced into the gallbladder fossa, coiled and locked. Bile was aspirated and a small amount of contrast was injected as several post procedural spot radiographic images were obtained in various obliquities. The catheter was secured to the skin with suture, connected to a drainage bag and a dressing was placed. The patient tolerated the procedure well without immediate post procedural complication. IMPRESSION: Successful placement of a 10 Fr cholecystostomy drainage catheter, as above. PLAN: The patient is to return to Vascular Interventional Radiology (VIR) for routine catheter evaluation and exchange in 6 weeks. JMichaelle Birks MD [facility] Electronically Signed   By: JMichaelle BirksM.D.   On: 04/26/2022 11:50  ? ?DG Chest  Portable 1 View ? ?Result Date: 04/25/2022 ?CLINICAL DATA:  Generalized weakness, RIGHT upper quadrant pain EXAM: PORTABLE CHEST 1 VIEW COMPARISON:  Portable exam 1119 hours compared to 10/02/2015 FINDINGS: Normal heart size, mediastinal contours, and pulmonary vascularity.  Bibasilar atelectasis. Lungs otherwise clear. No infiltrate, pleural effusion, or pneumothorax. IMPRESSION: Bibasilar atelectasis. Electronically Signed   By: Lavonia Dana M.D.   On: 04/25/2022 11:53  ? ?[place]

## 2022-04-28 NOTE — Progress Notes (Signed)
Mobility Specialist Progress Note  ? ? 04/28/22 1549  ?Mobility  ?Activity Ambulated with assistance in hallway  ?Level of Assistance Minimal assist, patient does 75% or more  ?Assistive Device Front wheel walker  ?Distance Ambulated (ft) 80 ft  ?Activity Response Tolerated fair  ?$Mobility charge 1 Mobility  ? ?Pt received in bed and agreeable. Had void in BR. No complaints during. Returned to chair with call bell in reach and wife present.  ? ?Hildred Alamin ?Mobility Specialist  ?Primary: 5N M.S. Phone: [phone] ?Secondary: 6N M.S. Phone: [phone] ?  ?

## 2022-04-29 DIAGNOSIS — D649 Anemia, unspecified: Secondary | ICD-10-CM

## 2022-04-29 DIAGNOSIS — I5021 Acute systolic (congestive) heart failure: Secondary | ICD-10-CM | POA: Diagnosis not present

## 2022-04-29 DIAGNOSIS — I10 Essential (primary) hypertension: Secondary | ICD-10-CM | POA: Diagnosis not present

## 2022-04-29 DIAGNOSIS — I480 Paroxysmal atrial fibrillation: Secondary | ICD-10-CM | POA: Diagnosis not present

## 2022-04-29 DIAGNOSIS — R0902 Hypoxemia: Secondary | ICD-10-CM

## 2022-04-29 DIAGNOSIS — K2100 Gastro-esophageal reflux disease with esophagitis, without bleeding: Secondary | ICD-10-CM

## 2022-04-29 DIAGNOSIS — J449 Chronic obstructive pulmonary disease, unspecified: Secondary | ICD-10-CM | POA: Diagnosis not present

## 2022-04-29 DIAGNOSIS — I714 Abdominal aortic aneurysm, without rupture, unspecified: Secondary | ICD-10-CM | POA: Diagnosis not present

## 2022-04-29 DIAGNOSIS — K8000 Calculus of gallbladder with acute cholecystitis without obstruction: Secondary | ICD-10-CM | POA: Diagnosis not present

## 2022-04-29 DIAGNOSIS — K8042 Calculus of bile duct with acute cholecystitis without obstruction: Secondary | ICD-10-CM | POA: Diagnosis not present

## 2022-04-29 LAB — GLUCOSE, CAPILLARY
Glucose-Capillary: 135 mg/dL — ABNORMAL HIGH (ref 70–99)
Glucose-Capillary: 134 mg/dL — ABNORMAL HIGH (ref 70–99)
Glucose-Capillary: 150 mg/dL — ABNORMAL HIGH (ref 70–99)
Glucose-Capillary: 149 mg/dL — ABNORMAL HIGH (ref 70–99)

## 2022-04-29 LAB — BASIC METABOLIC PANEL
Anion gap: 7 (ref 5–15)
BUN: 14 mg/dL (ref 8–23)
CO2: 26 mmol/L (ref 22–32)
Calcium: 8.6 mg/dL — ABNORMAL LOW (ref 8.9–10.3)
Chloride: 106 mmol/L (ref 98–111)
Creatinine, Ser: 0.97 mg/dL (ref 0.61–1.24)
GFR, Estimated: >60 mL/min (ref >60)
Glucose, Bld: 133 mg/dL — ABNORMAL HIGH (ref 70–99)
Potassium: 3.7 mmol/L (ref 3.5–5.1)
Sodium: 139 mmol/L (ref 135–145)

## 2022-04-29 LAB — CBC
HCT: 37.4 % — ABNORMAL LOW (ref 39.0–52.0)
Hemoglobin: 12.6 g/dL — ABNORMAL LOW (ref 13.0–17.0)
MCH: 30.7 pg (ref 26.0–34.0)
MCHC: 33.7 g/dL (ref 30.0–36.0)
MCV: 91 fL (ref 80.0–100.0)
Platelets: 203 10*3/uL (ref 150–400)
RBC: 4.11 MIL/uL — ABNORMAL LOW (ref 4.22–5.81)
RDW: 14.1 % (ref 11.5–15.5)
WBC: 11.1 10*3/uL — ABNORMAL HIGH (ref 4.0–10.5)
nRBC: 0 % (ref 0.0–0.2)

## 2022-04-29 LAB — HEPARIN LEVEL (UNFRACTIONATED)
Heparin Unfractionated: 0.15 IU/mL — ABNORMAL LOW (ref 0.30–0.70)
Heparin Unfractionated: 0.42 IU/mL (ref 0.30–0.70)

## 2022-04-29 MED ORDER — APIXABAN 5 MG PO TABS
5.0000 mg | ORAL_TABLET | Freq: Two times a day (BID) | ORAL | Status: DC
  Administered 2022-04-29 – 2022-04-30 (×2): 5 mg via ORAL
  Filled 2022-04-29 (×2): qty 1

## 2022-04-29 MED ORDER — AMIODARONE HCL 200 MG PO TABS: 200.0000 mg | ORAL_TABLET | Freq: Two times a day (BID) | ORAL | Status: DC

## 2022-04-29 MED ORDER — AMIODARONE HCL 200 MG PO TABS
200.0000 mg | ORAL_TABLET | Freq: Two times a day (BID) | ORAL | Status: DC
  Administered 2022-04-29 – 2022-04-30 (×2): 200 mg via ORAL
  Filled 2022-04-29 (×2): qty 1

## 2022-04-29 MED ORDER — AMIODARONE HCL 200 MG PO TABS
200.0000 mg | ORAL_TABLET | Freq: Every day | ORAL | Status: DC
Start: 2022-05-07 — End: 2022-04-30

## 2022-04-29 NOTE — Assessment & Plan Note (Signed)
-  Unclear Etiology ?-Resolved on 04/28/22 ?-Check CXR if reoccurs  ?

## 2022-04-29 NOTE — Progress Notes (Signed)
? ?Progress Note ? ?Patient Name: Douglas Hurst. ?Date of Encounter: 04/29/2022 ? ?[facility] Cardiologist: Skeet Latch, MD  ? ?Subjective  ? ?No chest pain or SOB - no a fib maintaining SR to slight ST at 106 ? ?Inpatient Medications  ?  ?Scheduled Meds: ? atorvastatin  40 mg Oral Daily  ? insulin aspart  0-9 Units Subcutaneous TID WC  ? metoprolol tartrate  25 mg Oral BID  ? pantoprazole (PROTONIX) IV  40 mg Intravenous Q24H  ? sodium chloride flush  5 mL Intracatheter Q8H  ? ?Continuous Infusions: ? amiodarone 30 mg/hr (04/29/22 0129)  ?  ceFAZolin (ANCEF) IV 1 g (04/29/22 0530)  ? heparin 1,800 Units/hr (04/29/22 0529)  ? ?PRN Meds: ?acetaminophen **OR** acetaminophen, alum & mag hydroxide-simeth, HYDROmorphone (DILAUDID) injection, metoprolol tartrate, nitroGLYCERIN, ondansetron **OR** ondansetron (ZOFRAN) IV  ? ?Vital Signs  ?  ?Vitals:  ? 04/28/22 1921 04/29/22 0004 04/29/22 3086 04/29/22 5784  ?BP: 132/78 117/62 120/75 (!) 157/79  ?Pulse: 88 79  86  ?Resp: 20 17 (!) 21 (!) 21  ?Temp: (!) 97.5 ?F (36.4 ?C) 97.7 ?F (36.5 ?C) 98 ?F (36.7 ?C) 98.6 ?F (37 ?C)  ?TempSrc: Oral Oral Oral Oral  ?SpO2: 96%   97%  ?Weight:      ?Height:      ? ? ?Intake/Output Summary (Last 24 hours) at 04/29/2022 1012 ?Last data filed at 04/29/2022 6962 ?Gross per 24 hour  ?Intake 984.27 ml  ?Output 1720 ml  ?Net -735.73 ml  ? ? ?  04/25/2022  ?  5:34 PM 09/01/2016  ?  6:36 AM 10/02/2015  ?  7:43 PM  ?Last 3 Weights  ?Weight (lbs) 186 lb 15.2 oz 208 lb 208 lb 14.4 oz  ?Weight (kg) 84.8 kg 94.348 kg 94.756 kg  ?   ? ?Telemetry  ?  ?SR to ST - Personally Reviewed ? ?ECG  ?  ?No new - Personally Reviewed ? ?Physical Exam  ? ?GEN: No acute distress.   ?Neck: No JVD ?Cardiac: RRR, no murmurs, rubs, or gallops.  ?Respiratory: Clear to auscultation bilaterally. ?GI: Soft, nontender-currently, non-distended  ?MS: No edema; No deformity. ?Neuro:  Nonfocal  ?Psych: Normal affect  ? ?Labs  ?  ?High Sensitivity Troponin:   ?Recent Labs  ?Lab  04/26/22 ?0217 04/26/22 ?9528  ?TROPONINIHS 24* 26*  ?   ?Chemistry ?Recent Labs  ?Lab 04/25/22 ?4132 04/26/22 ?0103 04/27/22 ?4401 04/28/22 ?0272 04/29/22 ?5366  ?NA 135 135 138 136 139  ?K 4.2 4.2 3.9 3.7 3.7  ?CL 100 105 104 105 106  ?CO2 22 21* '26 22 26  '$ ?GLUCOSE 197* 157* 159* 171* 133*  ?BUN 15 16 24* 21 14  ?CREATININE 1.02 0.94 1.09 1.05 0.97  ?CALCIUM 9.7 9.0 8.7* 8.6* 8.6*  ?MG  --   --   --  1.8  --   ?PROT 7.5 6.4*  --  5.6*  --   ?ALBUMIN 4.0 3.2*  --  2.3*  --   ?AST 23 21  --  19  --   ?ALT 21 21  --  16  --   ?ALKPHOS 64 46  --  40  --   ?BILITOT 1.1 1.2  --  0.7  --   ?GFRNONAA >60 >60 >60 >60 >60  ?ANIONGAP '13 9 8 9 7  '$ ?  ?Lipids No results for input(s): CHOL, TRIG, HDL, LABVLDL, LDLCALC, CHOLHDL in the last 168 hours.  ?Hematology ?Recent Labs  ?Lab 04/27/22 ?0733 04/28/22 ?  2633 04/29/22 ?3545  ?WBC 15.7* 14.0* 11.1*  ?RBC 3.95* 4.12* 4.11*  ?HGB 12.0* 12.8* 12.6*  ?HCT 37.1* 38.4* 37.4*  ?MCV 93.9 93.2 91.0  ?MCH 30.4 31.1 30.7  ?MCHC 32.3 33.3 33.7  ?RDW 14.2 14.0 14.1  ?PLT 169 180 203  ? ?Thyroid  ?Recent Labs  ?Lab 04/28/22 ?0940  ?TSH 1.058  ?  ?BNPNo results for input(s): BNP, PROBNP in the last 168 hours.  ?DDimer No results for input(s): DDIMER in the last 168 hours.  ? ?Radiology  ?  ?No results found. ? ?Cardiac Studies  ? ?2d echo 04/26/22 ? ? 1. Left ventricular ejection fraction, by estimation, is 40 to 45%. The  ?left ventricle has mildly decreased function. The left ventricle  ?demonstrates global hypokinesis. There is mild left ventricular  ?hypertrophy. Left ventricular diastolic parameters  ?are consistent with Grade I diastolic dysfunction (impaired relaxation).  ? 2. Right ventricular systolic function is normal. The right ventricular  ?size is normal. There is normal pulmonary artery systolic pressure.  ? 3. The mitral valve is abnormal. Mild mitral valve regurgitation. No  ?evidence of mitral stenosis. Moderate mitral annular calcification.  ? 4. The tricuspid valve is  abnormal. Tricuspid valve regurgitation is mild  ?to moderate.  ? 5. The aortic valve is tricuspid. There is moderate calcification of the  ?aortic valve. There is moderate thickening of the aortic valve. Aortic  ?valve regurgitation is not visualized. No aortic stenosis is present.  ? 6. The inferior vena cava is dilated in size with >50% respiratory  ?variability, suggesting right atrial pressure of 8 mmHg.  ? ?Patient Profile  ?   ?81 y.o. male  with COPD, HTN, HLD, T2DM, BPH, GERD, AAA (4cm by CT this admission), chronic back pain and bipolar disorder who presented to the ED 4/29 with RUQ pain for a couple days and weakness causing fall and inability to get back up. He was afebrile with leukocytosis to 27.9k and CT suggestive of acute cholecystitis with 1.3cm gallstone in the neck of the gallbladder and 68m CBD stone with proximal biliary dilatation. He was given IVF, zosyn, and admitted for further management. General surgery and GI have recommended cholecystostomy tube for management which has been performed 4/30. Cardiology consulted for new onset atrial fibrillation and LV dysfunction EF 40-45%. ? ?Assessment & Plan  ?  ?1. New onset paroxysmal atrial fibrillation, atrial flutter ?- first documented this admission and spontaneously converted to NSR, but then recurred later on yesterday prompting initiation of IV heparin & IV amiodarone after IV metoprolol failed to control HR ?- check TSH ?- will review med plans with MD and question whether this will affect the prior recommendation for OP monitor -> presently on IV amiodarone + metoprolol '25mg'$  BID + IV heparin -  ?No a fib overnight SR to ST at 106.  ? Transition to po amiodarone today  ?-defer heparin to eliquis per primary team depending on procedures ?  ?2. LV dysfunction/cardiomyopathy of unclear etiology, possibly related to acute physiologic stress ?- 2D echo 04/26/22 EF 40-45%, global HK, g1DD mild MR, mild-moderate TR - no prior to compare to ?-  currently on BB, consider consolidating to Toprol when rhythm issues settle down ?- hold off additional GDMT as BP swings soft at times, may need BP room to advance AVN blocking agents ?  ?3. Acute cholecystitis s/p perc drain ?- per notes, will see surgery/GI as outpatient to determine timing of cholecystectomy - suspect they will need commentary on  preoperative evaluation so will tentatively await recs from MD ?  ?4. 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  ?Remainder of medical problems per primary team ?  ?   ? ?For questions or updates, please contact [place] ?Please consult [email or web address] for contact info under  ? ?  ?   ?Signed, ?Cecilie Kicks, NP  ?04/29/2022, 10:12 AM    ?

## 2022-04-29 NOTE — Assessment & Plan Note (Addendum)
-  Patient's Hgb/Hct went from 14.1/41.3 -> 12.0/37.1 -> 12.8/38.4 -> 12.6/37.4 and is now 12.4/36.9 and stable ?-Check Anemia Panel in the outpatient setting ?-Continue to Monitor for S/Sx of Bleeding; No overt bleeding noted ?-Repeat CBC within 1 week  ?

## 2022-04-29 NOTE — Assessment & Plan Note (Addendum)
-  Patient's echocardiogram on 04/26/2022 showed an EF of 40 to 45% with global hypokinesis and grade 1 diastolic dysfunction and mild MR and mild to moderate TR and had no prior to compare to ?-Currently now euvolemic and no diuretic required ?-BB as above and cardiology considering consolidating metoprolol and rhythm issues settle down ?-Holding GDMT due to his BP swinging to the softer side; he may need BP room to advance AV blocking agents ?-Cardiology is following and recommending repeating echo in a month as they suspect his LVEF is reduced in the setting of sepsis with cholecystitis and new onset A-fib. ?-They are recommending outpatient ischemic evaluation if his LVEF remains reduced; currently he has not had any ischemic symptoms  ?-Follow-up with cardiology in outpatient setting and cardiology recommends repeating echocardiogram and continuing metoprolol for discharge ?

## 2022-04-29 NOTE — Hospital Course (Addendum)
The patient Douglas Hurst. is a 81 y.o. male with a history of COPD, HTN, HLD, T2DM, BPH, GERD, AAA, chronic back pain and bipolar disorder who presented to the ED 4/29 with RUQ pain for a couple days and weakness causing fall and inability to get back up. He was afebrile with leukocytosis to 27.9k and CT suggestive of acute cholecystitis with 1.3cm gallstone in the neck of the gallbladder and 60m CBD stone with proximal biliary dilatation. He was given IVF, zosyn, and admitted for further management. General surgery and GI have recommended cholecystostomy tube for management which has been performed 4/30. Developed new onset AFib with RVR and depressed LVEF found on echocardiogram for which cardiology consulted. Initially treated with metoprolol, has had hypotension for which amiodarone infusion was started.  Subsequently converted to normal sinus rhythm IV heparin also was initiated with recurrence of AFib.  Cardiology transition the patient to p.o. amiodarone and he was also transitioned off of the heparin drip to p.o. apixaban.  His T. bili went up slightly but I spoke with GI and they recommend outpatient follow-up within 1 week.  He is due medically stable follow-up with PCP and PT OT had recommended no follow-up but patient's wife requested a rolling walker.  Patient improved significantly and he is deemed medically stable for discharge no need to follow-up with PCP, cardiology, general surgery, interventional radiology, gastroenterology in outpatient setting.  ?

## 2022-04-29 NOTE — Progress Notes (Signed)
ANTICOAGULATION CONSULT NOTE ? ?Pharmacy Consult for heparin ?Indication: atrial fibrillation ? ?No Known Allergies ? ?Patient Measurements: ?Height: '6\' 2"'$  (188 cm) ?Weight: 84.8 kg (186 lb 15.2 oz) ?IBW/kg (Calculated) : 82.2 ?Heparin Dosing Weight: 84.8 ? ?Vital Signs: ?Temp: 98 ?F (36.7 ?C) (05/03 0420) ?Temp Source: Oral (05/03 0420) ?BP: 120/75 (05/03 0420) ?Pulse Rate: 79 (05/03 0004) ? ?Labs: ?Recent Labs  ?  04/26/22 ?2878 04/27/22 ?6767 04/27/22 ?2094 04/28/22 ?7096 04/28/22 ?2836 04/28/22 ?1849 04/29/22 ?6294  ?HGB  --  12.0*   < > 12.8*  --   --  12.6*  ?HCT  --  37.1*  --  38.4*  --   --  37.4*  ?PLT  --  169  --  180  --   --  203  ?LABPROT 16.0*  --   --   --   --   --   --   ?INR 1.3*  --   --   --   --   --   --   ?HEPARINUNFRC  --   --   --   --  0.12* 0.30 0.15*  ?CREATININE  --  1.09  --  1.05  --   --  0.97  ? < > = values in this interval not displayed.  ? ? ?Estimated Creatinine Clearance: 71.8 mL/min (by C-G formula based on SCr of 0.97 mg/dL). ? ?Medical History: ?Past Medical History:  ?Diagnosis Date  ? Bipolar disorder ([place])   ? Chronic back pain   ? COPD (chronic obstructive pulmonary disease) ([place])   ? Dr. Laurann Montana  ? Diverticulosis   ? ED (erectile dysfunction)   ? Hiatal hernia   ? Hyperlipidemia   ? Hypertension   ? ? ?Medications:  ?Medications Prior to Admission  ?Medication Sig Dispense Refill Last Dose  ? amLODipine (NORVASC) 5 MG tablet Take 5 mg by mouth daily.   04/24/2022  ? aspirin EC 81 MG tablet Take 81 mg by mouth daily. Swallow whole.   04/24/2022  ? atorvastatin (LIPITOR) 40 MG tablet Take 40 mg by mouth daily.   04/24/2022  ? metFORMIN (GLUCOPHAGE-XR) 500 MG 24 hr tablet Take 500-1,000 mg by mouth See admin instructions. Take 2 tablets (1000 mg) in the morning and 1 tablet (500 mg) at night   04/24/2022  ? Multiple Vitamin (MULTIVITAMIN) tablet Take 1 tablet by mouth daily.   04/24/2022  ? pantoprazole (PROTONIX) 40 MG tablet Take 40 mg by mouth daily.   04/24/2022  ?  tadalafil (CIALIS) 20 MG tablet Take 20 mg by mouth every three (3) days as needed for erectile dysfunction.   Past Month  ? telmisartan (MICARDIS) 80 MG tablet Take 80 mg by mouth daily.   04/24/2022  ? ? ?Assessment: Douglas Stahl. is a 81 y.o. male s/p cholecystostomy 4/30 with drain still in place, now with new onset atrial fibrillation with RVR. Hgb 12.0, PLT 169, CrCl ~64 mL/min, Pharmacy consulted to start heparin infusion. ? ?heparin level subtherapeutic: 0.15 No infusion issues or bleeding per RN. CBC stable ? ? ?Goal of Therapy:  ?Heparin level 0.3-0.7 units/ml ?Monitor platelets by anticoagulation protocol: Yes ?  ?Plan:  ?Increase heparin infusion to 1800 units/hr ?Check anti-Xa level with am labs and daily while on heparin ?Continue to monitor H&H and platelets ?F/u [facility] plans for discharge ? ? ?Georga Bora, PharmD ?Clinical Pharmacist ?04/29/2022 5:19 AM ?Please check AMION for all [place] numbers ? ? ?

## 2022-04-29 NOTE — Progress Notes (Signed)
ANTICOAGULATION CONSULT NOTE ? ?Pharmacy Consult for heparin>>apixaban ?Indication: atrial fibrillation ? ?No Known Allergies ? ?Patient Measurements: ?Height: '6\' 2"'$  (188 cm) ?Weight: 84.8 kg (186 lb 15.2 oz) ?IBW/kg (Calculated) : 82.2 ?Heparin Dosing Weight: 84.8 ? ?Vital Signs: ?Temp: 98.6 ?F (37 ?C) (05/03 1551) ?Temp Source: Oral (05/03 1551) ?BP: 138/81 (05/03 1551) ?Pulse Rate: 78 (05/03 1551) ? ?Labs: ?Recent Labs  ?  04/27/22 ?0733 04/28/22 ?0539 04/28/22 ?7673 04/28/22 ?1849 04/29/22 ?4193 04/29/22 ?1509  ?HGB 12.0* 12.8*  --   --  12.6*  --   ?HCT 37.1* 38.4*  --   --  37.4*  --   ?PLT 169 180  --   --  203  --   ?HEPARINUNFRC  --   --    < > 0.30 0.15* 0.42  ?CREATININE 1.09 1.05  --   --  0.97  --   ? < > = values in this interval not displayed.  ? ? ?Estimated Creatinine Clearance: 71.8 mL/min (by C-G formula based on SCr of 0.97 mg/dL). ? ?Medical History: ?Past Medical History:  ?Diagnosis Date  ? Bipolar disorder ([place])   ? Chronic back pain   ? COPD (chronic obstructive pulmonary disease) ([place])   ? Dr. Laurann Montana  ? Diverticulosis   ? ED (erectile dysfunction)   ? Hiatal hernia   ? Hyperlipidemia   ? Hypertension   ? ? ?Medications:  ?Medications Prior to Admission  ?Medication Sig Dispense Refill Last Dose  ? amLODipine (NORVASC) 5 MG tablet Take 5 mg by mouth daily.   04/24/2022  ? aspirin EC 81 MG tablet Take 81 mg by mouth daily. Swallow whole.   04/24/2022  ? atorvastatin (LIPITOR) 40 MG tablet Take 40 mg by mouth daily.   04/24/2022  ? metFORMIN (GLUCOPHAGE-XR) 500 MG 24 hr tablet Take 500-1,000 mg by mouth See admin instructions. Take 2 tablets (1000 mg) in the morning and 1 tablet (500 mg) at night   04/24/2022  ? Multiple Vitamin (MULTIVITAMIN) tablet Take 1 tablet by mouth daily.   04/24/2022  ? pantoprazole (PROTONIX) 40 MG tablet Take 40 mg by mouth daily.   04/24/2022  ? tadalafil (CIALIS) 20 MG tablet Take 20 mg by mouth every three (3) days as needed for erectile dysfunction.   Past Month  ?  telmisartan (MICARDIS) 80 MG tablet Take 80 mg by mouth daily.   04/24/2022  ? ? ?Assessment: Douglas Lebleu. is a 81 y.o. male s/p cholecystostomy 4/30 with drain still in place, now with new onset atrial fibrillation with RVR. Hgb 12.0, PLT 169, CrCl ~64 mL/min, Pharmacy consulted to start heparin infusion. ? ?Transition to PO apixaban today. Age<80, wt>60.  ?Hgb ~ 12s, plt wnl ? ?Goal of Therapy:  ?Monitor platelets by anticoagulation protocol: Yes ?  ?Plan:  ?Dc heparin ?Apixaban '5mg'$  PO BID ?Rx will follow peripherally ? ?Onnie Boer, PharmD, BCIDP, AAHIVP, CPP ?Infectious Disease Pharmacist ?04/29/2022 4:22 PM ? ? ? ? ?

## 2022-04-29 NOTE — Assessment & Plan Note (Signed)
-  See Sepsis  ?

## 2022-04-29 NOTE — Progress Notes (Signed)
? ? ?Referring Physician(s): Louanna Raw, MD ? ?Supervising Physician: Arne Cleveland ? ?Patient Status:  [name] - In-pt ? ?Chief Complaint: Follow up percutaneous cholecystostomy placed 04/26/22 in IR ? ?Subjective: ? ?Patient laying in bed, just had a bath. Wife at bedside. He reports really no appetite but is trying to drink some liquids today, he denies n/v/abdominal pain just doesn't feel like eating. He also feels quite weak still. Drain has been working well as far as he knows, asking if it can be emptied. ? ?Allergies: ?Patient has no known allergies. ? ?Medications: ?Prior to Admission medications   ?Medication Sig Start Date End Date Taking? Authorizing Provider  ?amLODipine (NORVASC) 5 MG tablet Take 5 mg by mouth daily. 03/31/22  Yes [provider]  ?aspirin EC 81 MG tablet Take 81 mg by mouth daily. Swallow whole.   Yes [provider]  ?atorvastatin (LIPITOR) 40 MG tablet Take 40 mg by mouth daily.   Yes [provider]  ?metFORMIN (GLUCOPHAGE-XR) 500 MG 24 hr tablet Take 500-1,000 mg by mouth See admin instructions. Take 2 tablets (1000 mg) in the morning and 1 tablet (500 mg) at night 03/17/22  Yes [provider]  ?Multiple Vitamin (MULTIVITAMIN) tablet Take 1 tablet by mouth daily.   Yes [provider]  ?pantoprazole (PROTONIX) 40 MG tablet Take 40 mg by mouth daily.   Yes [provider]  ?tadalafil (CIALIS) 20 MG tablet Take 20 mg by mouth every three (3) days as needed for erectile dysfunction. 03/29/22  Yes [provider]  ?telmisartan (MICARDIS) 80 MG tablet Take 80 mg by mouth daily. 02/23/22  Yes [provider]  ? ? ? ?Vital Signs: ?BP (!) 149/77 (BP Location: Right Arm)   Pulse 80   Temp 98.8 ?F (37.1 ?C) (Oral)   Resp 20   Ht '6\' 2"'$  (1.88 m)   Wt 186 lb 15.2 oz (84.8 kg)   SpO2 96%   BMI 24.00 kg/m?  ? ?Physical Exam ?Vitals and nursing note reviewed.  ?Constitutional:   ?   General: He is not in acute  distress. ?HENT:  ?   Head: Normocephalic.  ?Cardiovascular:  ?   Rate and Rhythm: Normal rate.  ?Pulmonary:  ?   Effort: Pulmonary effort is normal.  ?Abdominal:  ?   General: There is no distension.  ?   Palpations: Abdomen is soft.  ?   Tenderness: There is no abdominal tenderness.  ?   Comments: (+) RUQ drain to bulb with ~75 cc clear, bilious output which was emptied on my exam per patient request. Insertion site unremarkable, drain flushed easily with 10 cc NS.  ?Skin: ?   General: Skin is warm and dry.  ?   Coloration: Skin is not jaundiced.  ?Neurological:  ?   Mental Status: He is alert. Mental status is at baseline.  ? ? ?Imaging: ?IR Perc Cholecystostomy ? ?Result Date: 04/26/2022 ?INDICATION: Acute cholecystitis EXAM: ULTRASOUND AND FLUOROSCOPIC-GUIDED CHOLECYSTOSTOMY TUBE PLACEMENT COMPARISON:  CT AP, 04/25/2022 MEDICATIONS: Antibiotics were administered within an appropriate time frame prior to skin puncture. ANESTHESIA/SEDATION: Local anesthetic and single agent sedation was employed during this procedure. A total of fentanyl 25 mcg was administered intravenously. The patient's level of consciousness and vital signs were monitored continuously by radiology nursing throughout the procedure under my direct supervision. CONTRAST:  24m OMNIPAQUE IOHEXOL 300 MG/ML SOLN - administered into the gallbladder fossa. FLUOROSCOPY TIME:  Fluoroscopic dose; 6.7 mGy COMPLICATIONS: None immediate. PROCEDURE: Informed written consent  was obtained from the patient after a discussion of the risks, benefits and alternatives to treatment. Questions regarding the procedure were encouraged and answered. A timeout was performed prior to the initiation of the procedure. The right upper abdominal quadrant was prepped and draped in the usual sterile fashion, and a sterile drape was applied covering the operative field. Maximum barrier sterile technique with sterile gowns and gloves were used for the procedure. A timeout was  performed prior to the initiation of the procedure. Local anesthesia was provided with 1% lidocaine with epinephrine. Ultrasound scanning of the right upper quadrant demonstrates a markedly dilated gallbladder. Of note, the patient reported pain with ultrasound imaging over the gallbladder. Utilizing a transhepatic approach, a 18 gauge, 15 cm trocar needle was advanced into the gallbladder under direct ultrasound guidance. An ultrasound image was saved for documentation purposes. Appropriate intraluminal puncture was confirmed with the efflux of bile and advancement of an 0.018 wire into the gallbladder lumen. The needle was exchanged for an [place] set. A small amount of contrast was injected to confirm appropriate intraluminal positioning. Over a Careers adviser, a 10 Fr Cook cholecystomy tube was advanced into the gallbladder fossa, coiled and locked. Bile was aspirated and a small amount of contrast was injected as several post procedural spot radiographic images were obtained in various obliquities. The catheter was secured to the skin with suture, connected to a drainage bag and a dressing was placed. The patient tolerated the procedure well without immediate post procedural complication. IMPRESSION: Successful placement of a 10 Fr cholecystostomy drainage catheter, as above. PLAN: The patient is to return to Vascular Interventional Radiology (VIR) for routine catheter evaluation and exchange in 6 weeks. Michaelle Birks, MD [facility] Electronically Signed   By: Michaelle Birks M.D.   On: 04/26/2022 11:50  ? ?ECHOCARDIOGRAM COMPLETE ? ?Result Date: 04/26/2022 ?   ECHOCARDIOGRAM REPORT   Patient Name:   Douglas Hurst. Date of Exam: 04/26/2022 Medical Rec #:  [id number]          Height:       74.0 in Accession #:    [id number]         Weight:       186.9 lb Date of Birth:  1941/07/02         BSA:          2.112 m? Patient Age:    81 years           BP:            92/62 mmHg Patient Gender: M                  HR:           76 bpm. Exam Location:  Inpatient Procedure: 2D Echo, Cardiac Doppler and Color Doppler Indications:    Abnl ECG  History:        Patient has no prior history of Echocardiogram examinations.  Sonographer:    Wenda Low Referring Phys: [place]  1. Left ventricular ejection fraction, by estimation, is 40 to 45%. The left ventricle has mildly decreased function. The left ventricle demonstrates global hypokinesis. There is mild left ventricular hypertrophy. Left ventricular diastolic parameters are consistent with Grade I diastolic dysfunction (impaired relaxation).  2. Right ventricular systolic function is normal. The right ventricular size is normal. There is normal pulmonary artery systolic pressure.  3. The mitral valve is abnormal. Mild mitral valve  regurgitation. No evidence of mitral stenosis. Moderate mitral annular calcification.  4. The tricuspid valve is abnormal. Tricuspid valve regurgitation is mild to moderate.  5. The aortic valve is tricuspid. There is moderate calcification of the aortic valve. There is moderate thickening of the aortic valve. Aortic valve regurgitation is not visualized. No aortic stenosis is present.  6. The inferior vena cava is dilated in size with >50% respiratory variability, suggesting right atrial pressure of 8 mmHg. FINDINGS  Left Ventricle: Left ventricular ejection fraction, by estimation, is 40 to 45%. The left ventricle has mildly decreased function. The left ventricle demonstrates global hypokinesis. The left ventricular internal cavity size was normal in size. There is  mild left ventricular hypertrophy. Left ventricular diastolic parameters are consistent with Grade I diastolic dysfunction (impaired relaxation). Normal left ventricular filling pressure. Right Ventricle: The right ventricular size is normal. Right vetricular wall thickness was not well visualized. Right  ventricular systolic function is normal. There is normal pulmonary artery systolic pressure. The tricuspid regurgitant velocity is 2.63 m/s, and with an assumed right atrial pressure of 8 mmHg, the estimated right ventricular

## 2022-04-29 NOTE — Progress Notes (Signed)
?PROGRESS NOTE ? ? ? ?Douglas Hurst.  QMV:[id number] DOB: Jul 15, 1941 DOA: 04/25/2022 ?PCP: Lavone Orn, MD  ? ?Brief Narrative:  ?The patient Douglas Hurst. is a 81 y.o. male with a history of COPD, HTN, HLD, T2DM, BPH, GERD, AAA, chronic back pain and bipolar disorder who presented to the ED 4/29 with RUQ pain for a couple days and weakness causing fall and inability to get back up. He was afebrile with leukocytosis to 27.9k and CT suggestive of acute cholecystitis with 1.3cm gallstone in the neck of the gallbladder and 58m CBD stone with proximal biliary dilatation. He was given IVF, zosyn, and admitted for further management. General surgery and GI have recommended cholecystostomy tube for management which has been performed 4/30. Developed new onset AFib with RVR and depressed LVEF found on echocardiogram for which cardiology consulted. Initially treated with metoprolol, has had hypotension for which amiodarone infusion was started.  Subsequently converted to normal sinus rhythm IV heparin also was initiated with recurrence of AFib.  Cardiology likely to transition to p.o. amiodarone today and we will likely transition the IV heparin drip to p.o. Eliquis today.   ? ?Assessment and Plan: ?* sepsis secondary to calculus of bile duct (any) with acute cholecystitis ?-s/p cholecystostomy 4/30, continue routine drain care/flushed and output monitoring. Will follow up with IR for cholangiogram after discharge. If still choledocholithiasis, would need ERCP. Will need eventual lap cholecystectomy per surgery. LFTs remain reassuring. ?- Based on pansensitivity of E. coli in GB culture, switch to ancef per pharmacy. WBC improving, still elevated. ?-WBC went from 27.9 -> 15.7 -> 14.0 -> 11.1 ?- S. hominis growing in 1 of 3 blood cultures consistent with contaminant.   ?-Sepsis improving  ? ? ?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[place] ?-Well controlled, A1C of 6.5 in  01/2022 ?-Continue to Hold metformin while inpatient ?-C/w  Sensitive Novolog SSI AC and accuchecks  ?-CBGs ranging from 121-135 ? ?Essential hypertension ?-Well controlled-soft pressures in setting of sepsis ?-Holding home meds for now (micardis '80mg'$  and novasc '5mg'$  daily? ?-Will D/C Home Amlodipine given New Diagnosis of HFrEF ?-C/w Metoprolol Tartrate 25 mg po BID ?-Continue to Monitor BP per Protocol ?-Last BP reading was 149/77 ? ?Gastroesophageal reflux disease ?-Continue Pantoprazole 40 mg IV q24h but likely can change to po  ? ?Mixed hyperlipidemia ?-Continue Statin with Atorvastatin 40 mg po daily  ? ?Chronic obstructive pulmonary disease (HCC) ?-Stable,  ?-No active Bronchospasm ?-Avoiding  Albuterol with no wheezing to avoid worsening RVR  ?-Currently not on any inhalers  ? ?Bipolar disorder ([place] ?-Stable and Quiescent  ? ?Abdominal aortic aneurysm ([place] ?-Infrarenal AAA measuring 4cm, small increase since 2021 [place]?-Recommend routine f/u with vascular  ? ?Normocytic anemia ?-Patient's Hgb/Hct went from 14.1/41.3 -> 12.0/37.1 -> 12.8/38.4 -> 12.6/37.4 ?-Check Anemia Panel in the AM ?-Continue to Monitor for S/Sx of Bleeding; No overt bleeding noted ?-Repeat CBC in the AM  ? ?Hypoxia ?-Unclear Etiology ?-Resolved on 04/28/22 ?-Check CXR if reoccurs  ? ?PAF (paroxysmal atrial fibrillation) ([place] ?New Onset A Fib with RVR ?-Precipitated by sepsis. Quickly converted with dilt gtt, remains in NSR with metoprolol.  ?-Initiated low dose metoprolol. Continue tartrate formulation due to soft BP. Started amio gtt, per cardiology planning to convert to po 5/3.  ?-Avoid CCB with LV systolic dysfunction. ?-Currently on a heparin drip but we will be changing this to p.o. Eliquis given that there is no objections from the IR side or surgery side ?-  Started heparin IV since recurrent AFib. Will no longer require outpatient cardiac monitoring. Benefits check requested for DOACs. Monitor CBC/for bleeding ? ?Acute systolic  heart failure ([place]) ?-Patient's echocardiogram on 04/26/2022 showed an EF of 40 to 45% with global hypokinesis and grade 1 diastolic dysfunction and mild MR and mild to moderate TR and had no prior to compare to ?-Currently now euvolemic and no diuretic required ?-BB as above and cardiology considering consolidating metoprolol and rhythm issues settle down ?-Holding GDMT due to his BP swinging to the softer side; he may need BP room to advance AV blocking agents ?-Cardiology is following and recommending repeating echo in a month as they suspect his LVEF is reduced in the setting of sepsis with cholecystitis and new onset A-fib. ?-They are recommending outpatient ischemic evaluation if his LVEF remains reduced; currently he has not had any ischemic symptoms  ? ?Acute calculous cholecystitis ?-See Sepsis  ? ?DVT prophylaxis: SCDs Start: 04/25/22 1511 ? ?  Code Status: Full Code ?Family Communication: Discussed with wife at bedside  ? ?Disposition Plan:  ?Level of care: Progressive ?Status is: Inpatient ?Remains inpatient appropriate because: Needs Cardiac Clearance and Anticipating D/C when diet is advanced and Specialists sign offf the case ?  ? ?Consultants:  ?General Surgery ?Gastroenterology ?IR ?Cardiology  ? ?Procedures:  ?ECHOCardiogram ?IMPRESSIONS  ? ? ? 1. Left ventricular ejection fraction, by estimation, is 40 to 45%. The  ?left ventricle has mildly decreased function. The left ventricle  ?demonstrates global hypokinesis. There is mild left ventricular  ?hypertrophy. Left ventricular diastolic parameters  ?are consistent with Grade I diastolic dysfunction (impaired relaxation).  ? 2. Right ventricular systolic function is normal. The right ventricular  ?size is normal. There is normal pulmonary artery systolic pressure.  ? 3. The mitral valve is abnormal. Mild mitral valve regurgitation. No  ?evidence of mitral stenosis. Moderate mitral annular calcification.  ? 4. The tricuspid valve is abnormal.  Tricuspid valve regurgitation is mild  ?to moderate.  ? 5. The aortic valve is tricuspid. There is moderate calcification of the  ?aortic valve. There is moderate thickening of the aortic valve. Aortic  ?valve regurgitation is not visualized. No aortic stenosis is present.  ? 6. The inferior vena cava is dilated in size with >50% respiratory  ?variability, suggesting right atrial pressure of 8 mmHg.  ? ?FINDINGS  ? Left Ventricle: Left ventricular ejection fraction, by estimation, is 40  ?to 45%. The left ventricle has mildly decreased function. The left  ?ventricle demonstrates global hypokinesis. The left ventricular internal  ?cavity size was normal in size. There is  ? mild left ventricular hypertrophy. Left ventricular diastolic parameters  ?are consistent with Grade I diastolic dysfunction (impaired relaxation).  ?Normal left ventricular filling pressure.  ? ?Right Ventricle: The right ventricular size is normal. Right vetricular  ?wall thickness was not well visualized. Right ventricular systolic  ?function is normal. There is normal pulmonary artery systolic pressure.  ?The tricuspid regurgitant velocity is 2.63  ?m/s, and with an assumed right atrial pressure of 8 mmHg, the estimated  ?right ventricular systolic pressure is 02.7 mmHg.  ? ?Left Atrium: Left atrial size was normal in size.  ? ?Right Atrium: Right atrial size was normal in size.  ? ?Pericardium: There is no evidence of pericardial effusion.  ? ?Mitral Valve: The mitral valve is abnormal. There is mild thickening of  ?the mitral valve leaflet(s). There is mild calcification of the mitral  ?valve leaflet(s). Moderate mitral annular calcification. Mild mitral  valve  ?regurgitation. No evidence of mitral  ? valve stenosis. MV peak gradient, 4.2 mmHg. The mean mitral valve  ?gradient is 1.0 mmHg.  ? ?Tricuspid Valve: The tricuspid valve is abnormal. Tricuspid valve  ?regurgitation is mild to moderate. No evidence of tricuspid stenosis.  ? ?Aortic  Valve: The aortic valve is tricuspid. There is moderate  ?calcification of the aortic valve. There is moderate thickening of the  ?aortic valve. There is moderate aortic valve annular calcification. Aortic  ?valve regurgitatio

## 2022-04-29 NOTE — Plan of Care (Signed)

## 2022-04-29 NOTE — Assessment & Plan Note (Addendum)
New Onset A Fib with RVR ?-Precipitated by sepsis. Quickly converted with dilt gtt, remains in NSR with metoprolol.  ?-Initiated low dose metoprolol. Continue tartrate formulation due to soft BP. Started amio gtt, per cardiology planning to convert to po 5/3.  ?-Avoid CCB with LV systolic dysfunction. ?-Currently on a heparin drip but we will be changing this to p.o. Eliquis given that there is no objections from the IR side or surgery side ?- Started heparin IV since recurrent AFib. Will no longer require outpatient cardiac monitoring. Benefits check requested for DOACs. Monitor CBC/for bleeding ?-Patient will follow up with his cardiologist in the next few weeks and repeat echocardiogram in outpatient setting.  Cardiology recommended continuing amiodarone and written for taper and recommended continue metoprolol tartrate as well as p.o. apixaban ?

## 2022-04-29 NOTE — Care Management Important Message (Signed)
Important Message ? ?Patient Details  ?Name: Douglas Hurst. ?MRN: [id number] ?Date of Birth: 08-09-1941 ? ? ?Medicare Important Message Given:  Yes ? ? ? ? ?Leary Mcnulty ?04/29/2022, 3:48 PM ?

## 2022-04-29 NOTE — Evaluation (Signed)
Physical Therapy Evaluation ?Patient Details ?Name: Douglas Hurst. ?MRN: [id number] ?DOB: 1941-05-02 ?Today's Date: 04/29/2022 ? ?History of Present Illness ? Pt is a 81 y.o. male admitted 04/25/22 with RUQ pain, weakness, fall. Workup for sepsis due to acute calculus cholecystitis, new onset afib with RVR, hypoxia, new dx HFrEF. S/p cholecystostomy 4/30. PMH includes COPD, HTN, HLD, DM2, GERD, AAA, chronic back pain, bipolar disorder. ?  ?Clinical Impression ? Pt presents with an overall decrease in functional mobility secondary to above. PTA, pt independent without DME, drives, retired from work, lives with supportive spouse. Today, pt moving well at supervision-level; pt endorses improved stability with RW/rollator use, but ultimately declines DME for return home. Educ re: activity recommendations, energy conservation strategies, importance of mobility. Will follow acutely to address established goals.  ? ?Recommendations for follow up therapy are one component of a multi-disciplinary discharge planning process, led by the attending physician.  Recommendations may be updated based on patient status, additional functional criteria and insurance authorization. ? ?Follow Up Recommendations No PT follow up ? ?  ?Assistance Recommended at Discharge Intermittent Supervision/Assistance  ?Patient can return home with the following ? Assistance with cooking/housework;Assist for transportation;Help with stairs or ramp for entrance ? ?  ?Equipment Recommendations None recommended by PT  ?Recommendations for Other Services ?    ?  ?Functional Status Assessment Patient has had a recent decline in their functional status and demonstrates the ability to make significant improvements in function in a reasonable and predictable amount of time.  ? ?  ?Precautions / Restrictions Precautions ?Precautions: Fall;Other (comment) ?Precaution Comments: RUQ drain ?Restrictions ?Weight Bearing Restrictions: No  ? ?  ? ?Mobility ? Bed  Mobility ?Overal bed mobility: Independent ?  ?  ?  ?  ?  ?  ?  ?  ? ?Transfers ?Overall transfer level: Needs assistance ?Equipment used: Rolling walker (2 wheels), Rollator (4 wheels) ?Transfers: Sit to/from Stand ?Sit to Stand: Supervision ?  ?  ?  ?  ?  ?General transfer comment: pt requesting initial trial with RW; additional sit<>stand from recliner with rollator, educ on sequencing with brakes, supervision for safety ?  ? ?Ambulation/Gait ?Ambulation/Gait assistance: Supervision ?Gait Distance (Feet): 110 Feet (+ 36') ?Assistive device: Rolling walker (2 wheels), Rollator (4 wheels) ?Gait Pattern/deviations: Step-through pattern, Decreased stride length ?Gait velocity: Decreased ?  ?  ?General Gait Details: pt requesting initial gait trial with RW (as opposed to no DME), slow, steady gait with RW and supervision, pt reports further distance limited by BLE fatigue requiring seated rest break back in room; additional gait trial with rollator; pt ultimately declines DME for home ? ?Stairs ?  ?  ?  ?  ?  ? ?Wheelchair Mobility ?  ? ?Modified Rankin (Stroke Patients Only) ?  ? ?  ? ?Balance Overall balance assessment: Needs assistance ?  ?Sitting balance-Leahy Scale: Good ?  ?  ?Standing balance support: No upper extremity supported, During functional activity ?Standing balance-Leahy Scale: Fair ?  ?  ?  ?  ?  ?  ?  ?  ?  ?  ?  ?  ?   ? ? ? ?Pertinent Vitals/Pain Pain Assessment ?Pain Assessment: No/denies pain  ? ? ?Home Living Family/patient expects to be discharged to:: Private residence ?Living Arrangements: Spouse/significant other ?Available Help at Discharge: Family;Available 24 hours/day ?Type of Home: House ?Home Access: Stairs to enter ?Entrance Stairs-Rails: None ?Entrance Stairs-Number of Steps: 3 ?  ?Home Layout: One level ?Home Equipment: None ?   ?  ?  Prior Function Prior Level of Function : Independent/Modified Independent;Driving ?  ?  ?  ?  ?  ?  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 Goes out for breakfast every morning with "the guys" ?  ?  ? ? ?Hand Dominance  ?   ? ?  ?Extremity/Trunk Assessment  ? Upper Extremity Assessment ?Upper Extremity Assessment: Generalized weakness ?  ? ?Lower Extremity Assessment ?Lower Extremity Assessment: Generalized weakness ?  ? ?   ?Communication  ? Communication: No difficulties  ?Cognition Arousal/Alertness: Awake/alert ?Behavior During Therapy: [facility] for tasks assessed/performed, Flat affect ?Overall Cognitive Status: Within Functional Limits for tasks assessed ?  ?  ?  ?  ?  ?  ?  ?  ?  ?  ?  ?  ?  ?  ?  ?  ?  ?  ?  ? ?  ?General Comments General comments (skin integrity, edema, etc.): Pt's wife present and supportive. Pt reports primary mobility concern in limited community ambulation due to BLE fatigue; increased time discussing DME options, pt declining DME or follow-up PT ? ?  ?Exercises    ? ?Assessment/Plan  ?  ?PT Assessment Patient needs continued PT services  ?PT Problem List Decreased strength;Decreased activity tolerance;Decreased balance;Decreased mobility;Decreased knowledge of use of DME ? ?   ?  ?PT Treatment Interventions DME instruction;Gait training;Stair training;Functional mobility training;Therapeutic activities;Therapeutic exercise;Balance training;Patient/family education   ? ?PT Goals (Current goals can be found in the Care Plan section)  ?Acute Rehab PT Goals ?Patient Stated Goal: return home; declines need for DME or follow-up PT ?PT Goal Formulation: With patient ?Time For Goal Achievement: 05/13/22 ?Potential to Achieve Goals: Good ? ?  ?Frequency Min 3X/week ?  ? ? ?Co-evaluation   ?  ?  ?  ?  ? ? ?  ?AM-PAC PT "6 Clicks" Mobility  ?Outcome Measure Help needed turning from your back to your side while in a flat bed without using bedrails?: None ?Help needed moving from lying on your back to sitting on the side of a flat bed  without using bedrails?: None ?Help needed moving to and from a bed to a chair (including a wheelchair)?: A Little ?Help needed standing up from a chair using your arms (e.g., wheelchair or bedside chair)?: A Little ?Help needed to walk in hospital room?: A Little ?Help needed climbing 3-5 steps with a railing? : A Little ?6 Click Score: 20 ? ?  ?End of Session Equipment Utilized During Treatment: Gait belt ?Activity Tolerance: Patient tolerated treatment well ?Patient left: in chair;with call bell/phone within reach;with chair alarm set;with family/visitor present ?Nurse Communication: Mobility status ?PT Visit Diagnosis: Other abnormalities of gait and mobility (R26.89);Muscle weakness (generalized) (M62.81) ?  ? ?Time: 0932-3557 ?PT Time Calculation (min) (ACUTE ONLY): 20 min ? ? ?Charges:   PT Evaluation ?$PT Eval Moderate Complexity: 1 Mod ?  ?  ?   ? ?Mabeline Caras, PT, DPT ?Acute Rehabilitation Services  ?Pager [phone] ?Office [phone] ? ?Derry Lory ?04/29/2022, 1:24 PM ? ?

## 2022-04-30 ENCOUNTER — Other Ambulatory Visit: Payer: Self-pay | Admitting: Physician Assistant

## 2022-04-30 ENCOUNTER — Other Ambulatory Visit (HOSPITAL_COMMUNITY): Payer: Self-pay

## 2022-04-30 DIAGNOSIS — K8000 Calculus of gallbladder with acute cholecystitis without obstruction: Secondary | ICD-10-CM | POA: Diagnosis not present

## 2022-04-30 DIAGNOSIS — K8042 Calculus of bile duct with acute cholecystitis without obstruction: Secondary | ICD-10-CM | POA: Diagnosis not present

## 2022-04-30 DIAGNOSIS — I5021 Acute systolic (congestive) heart failure: Secondary | ICD-10-CM

## 2022-04-30 DIAGNOSIS — K2100 Gastro-esophageal reflux disease with esophagitis, without bleeding: Secondary | ICD-10-CM | POA: Diagnosis not present

## 2022-04-30 DIAGNOSIS — E1142 Type 2 diabetes mellitus with diabetic polyneuropathy: Secondary | ICD-10-CM | POA: Diagnosis not present

## 2022-04-30 LAB — CBC WITH DIFFERENTIAL/PLATELET
Abs Immature Granulocytes: 0.18 10*3/uL — ABNORMAL HIGH (ref 0.00–0.07)
Basophils Absolute: 0.1 10*3/uL (ref 0.0–0.1)
Basophils Relative: 1 %
Eosinophils Absolute: 0.2 10*3/uL (ref 0.0–0.5)
Eosinophils Relative: 2 %
HCT: 36.9 % — ABNORMAL LOW (ref 39.0–52.0)
Hemoglobin: 12.4 g/dL — ABNORMAL LOW (ref 13.0–17.0)
Immature Granulocytes: 2 %
Lymphocytes Relative: 13 %
Lymphs Abs: 1.2 10*3/uL (ref 0.7–4.0)
MCH: 30.5 pg (ref 26.0–34.0)
MCHC: 33.6 g/dL (ref 30.0–36.0)
MCV: 90.9 fL (ref 80.0–100.0)
Monocytes Absolute: 1.2 10*3/uL — ABNORMAL HIGH (ref 0.1–1.0)
Monocytes Relative: 13 %
Neutro Abs: 6.3 10*3/uL (ref 1.7–7.7)
Neutrophils Relative %: 69 %
Platelets: 205 10*3/uL (ref 150–400)
RBC: 4.06 MIL/uL — ABNORMAL LOW (ref 4.22–5.81)
RDW: 14.2 % (ref 11.5–15.5)
WBC: 9.1 10*3/uL (ref 4.0–10.5)
nRBC: 0 % (ref 0.0–0.2)

## 2022-04-30 LAB — COMPREHENSIVE METABOLIC PANEL
ALT: 14 U/L (ref 0–44)
AST: 27 U/L (ref 15–41)
Albumin: 2.3 g/dL — ABNORMAL LOW (ref 3.5–5.0)
Alkaline Phosphatase: 48 U/L (ref 38–126)
Anion gap: 10 (ref 5–15)
BUN: 13 mg/dL (ref 8–23)
CO2: 24 mmol/L (ref 22–32)
Calcium: 8.4 mg/dL — ABNORMAL LOW (ref 8.9–10.3)
Chloride: 103 mmol/L (ref 98–111)
Creatinine, Ser: 0.82 mg/dL (ref 0.61–1.24)
GFR, Estimated: >60 mL/min (ref >60)
Glucose, Bld: 123 mg/dL — ABNORMAL HIGH (ref 70–99)
Potassium: 4.3 mmol/L (ref 3.5–5.1)
Sodium: 137 mmol/L (ref 135–145)
Total Bilirubin: 1.6 mg/dL — ABNORMAL HIGH (ref 0.3–1.2)
Total Protein: 5.8 g/dL — ABNORMAL LOW (ref 6.5–8.1)

## 2022-04-30 LAB — CULTURE, BLOOD (ROUTINE X 2): Culture: NO GROWTH

## 2022-04-30 LAB — GLUCOSE, CAPILLARY
Glucose-Capillary: 121 mg/dL — ABNORMAL HIGH (ref 70–99)
Glucose-Capillary: 185 mg/dL — ABNORMAL HIGH (ref 70–99)

## 2022-04-30 LAB — MAGNESIUM: Magnesium: 2 mg/dL (ref 1.7–2.4)

## 2022-04-30 LAB — PHOSPHORUS: Phosphorus: 2.9 mg/dL (ref 2.5–4.6)

## 2022-04-30 MED ORDER — SENNOSIDES-DOCUSATE SODIUM 8.6-50 MG PO TABS
1.0000 | ORAL_TABLET | Freq: Two times a day (BID) | ORAL | Status: DC
  Administered 2022-04-30: 1 via ORAL
  Filled 2022-04-30: qty 1

## 2022-04-30 MED ORDER — SODIUM CHLORIDE 0.9% FLUSH
5.0000 mL | Freq: Two times a day (BID) | INTRAVENOUS | 0 refills | Status: DC
Start: 2022-04-30 — End: 2022-07-01
  Filled 2022-04-30: qty 300, 30d supply, fill #0

## 2022-04-30 MED ORDER — POLYETHYLENE GLYCOL 3350 17 G PO PACK
17.0000 g | PACK | Freq: Two times a day (BID) | ORAL | Status: DC
  Administered 2022-04-30: 17 g via ORAL
  Filled 2022-04-30: qty 1

## 2022-04-30 MED ORDER — POLYETHYLENE GLYCOL 3350 17 GM/SCOOP PO POWD
17.0000 g | Freq: Every day | ORAL | 0 refills | Status: AC
  Filled 2022-04-30: qty 238, 14d supply, fill #0

## 2022-04-30 MED ORDER — ONDANSETRON HCL 4 MG PO TABS
4.0000 mg | ORAL_TABLET | Freq: Four times a day (QID) | ORAL | 0 refills | Status: DC | PRN
  Filled 2022-04-30: qty 20, 6d supply, fill #0

## 2022-04-30 MED ORDER — METOPROLOL TARTRATE 25 MG PO TABS
25.0000 mg | ORAL_TABLET | Freq: Two times a day (BID) | ORAL | 0 refills | Status: DC
  Filled 2022-04-30: qty 60, 30d supply, fill #0

## 2022-04-30 MED ORDER — CEFADROXIL 500 MG PO CAPS
1000.0000 mg | ORAL_CAPSULE | Freq: Two times a day (BID) | ORAL | 0 refills | Status: AC
  Filled 2022-04-30: qty 32, 8d supply, fill #0

## 2022-04-30 MED ORDER — APIXABAN 5 MG PO TABS
5.0000 mg | ORAL_TABLET | Freq: Two times a day (BID) | ORAL | 0 refills | Status: DC
  Filled 2022-04-30: qty 60, 30d supply, fill #0

## 2022-04-30 MED ORDER — SENNOSIDES-DOCUSATE SODIUM 8.6-50 MG PO TABS
1.0000 | ORAL_TABLET | Freq: Every day | ORAL | 0 refills | Status: DC
  Filled 2022-04-30: qty 30, 30d supply, fill #0

## 2022-04-30 MED ORDER — CEFADROXIL 500 MG PO CAPS
1000.0000 mg | ORAL_CAPSULE | Freq: Two times a day (BID) | ORAL | Status: DC
Start: 2022-04-30 — End: 2022-04-30
  Filled 2022-04-30 (×2): qty 2

## 2022-04-30 MED ORDER — ACETAMINOPHEN 325 MG PO TABS
650.0000 mg | ORAL_TABLET | Freq: Four times a day (QID) | ORAL | 0 refills | Status: AC | PRN
  Filled 2022-04-30: qty 20, 3d supply, fill #0

## 2022-04-30 MED ORDER — AMIODARONE HCL 200 MG PO TABS
ORAL_TABLET | ORAL | 0 refills | Status: DC
  Filled 2022-04-30: qty 44, 37d supply, fill #0

## 2022-04-30 NOTE — Discharge Summary (Signed)
?Physician Discharge Summary ?  ?Patient: Douglas Hurst. MRN: [id number] DOB: November 03, 1941  ?Admit date:     04/25/2022  ?Discharge date: 04/30/22  ?Discharge Physician: Kerney Elbe  ? ?PCP: Lavone Orn, MD  ? ?Recommendations at discharge:  ? ?Follow-up with PCP within 1 to 2 weeks and repeat CBC, CMP, mag, Phos level within 1 week ?Follow-up with cardiology within 1 to 2 weeks and have an outpatient echo repeated and ischemic work-up if necessary ?Follow-up with general surgery for timing of elective cholecystectomy ?Follow-up with interventional radiology drain clinic within 1 to 2 weeks and continue drain flushes 5 cc twice daily ?Follow-up with gastroenterology in outpatient setting in 4 to 6 weeks and have discussion about ERCP ?Follow up Vascular for AAA for Routine Follow Up ? ?Discharge Diagnoses: ?Principal Problem: ?  sepsis secondary to calculus of bile duct (any) with acute cholecystitis ?Active Problems: ?  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[place]) ?  Essential hypertension ?  Gastroesophageal reflux disease ?  Mixed hyperlipidemia ?  Chronic obstructive pulmonary disease (HCC) ?  Bipolar disorder ([place]) ?  Abdominal aortic aneurysm ([place]) ?  Sepsis ([place]) ?  Acute calculous cholecystitis ?  Acute systolic heart failure ([place]) ?  PAF (paroxysmal atrial fibrillation) ([place]) ?  Hypoxia ?  Normocytic anemia ?  Hyperbilirubinemia ? ?Resolved Problems: ?  Benign prostatic hyperplasia ? ?Hospital Course: ?The patient Douglas Hurst. is a 81 y.o. male with a history of COPD, HTN, HLD, T2DM, BPH, GERD, AAA, chronic back pain and bipolar disorder who presented to the ED 4/29 with RUQ pain for a couple days and weakness causing fall and inability to get back up. He was afebrile with leukocytosis to 27.9k and CT suggestive of acute cholecystitis with 1.3cm gallstone in the neck of the gallbladder and 25m CBD stone with proximal biliary dilatation. He was given IVF,  zosyn, and admitted for further management. General surgery and GI have recommended cholecystostomy tube for management which has been performed 4/30. Developed new onset AFib with RVR and depressed LVEF found on echocardiogram for which cardiology consulted. Initially treated with metoprolol, has had hypotension for which amiodarone infusion was started.  Subsequently converted to normal sinus rhythm IV heparin also was initiated with recurrence of AFib.  Cardiology transition the patient to p.o. amiodarone and he was also transitioned off of the heparin drip to p.o. apixaban.  His T. bili went up slightly but I spoke with GI and they recommend outpatient follow-up within 1 week.  He is due medically stable follow-up with PCP and PT OT had recommended no follow-up but patient's wife requested a rolling walker.  Patient improved significantly and he is deemed medically stable for discharge no need to follow-up with PCP, cardiology, general surgery, interventional radiology, gastroenterology in outpatient setting.  ? ?Assessment and Plan: ?* sepsis secondary to calculus of bile duct (any) with acute cholecystitis ?-s/p cholecystostomy 4/30, continue routine drain care/flushed and output monitoring. Will follow up with IR for cholangiogram after discharge. If still choledocholithiasis, would need ERCP. Will need eventual lap cholecystectomy per surgery. LFTs remain reassuring. ?-Follow-up with interventional radiology, gastroenterology in the outpatient setting and continue drain management per IR ?- Based on pansensitivity of E. coli in GB culture, switch to ancef per pharmacy. WBC improving, still elevated. ?-WBC went from 27.9 -> 15.7 -> 14.0 -> 11.1 and is now 9.1 ?- S. hominis growing in 1 of 3 blood cultures consistent with contaminant.   ?-  Sepsis improving  ?-I spoke with infectious diseases Dr. Linus Salmons who recommends a total of 2 weeks of antibiotic coverage and recommended changing to p.o. cefadroxil 1000 mg  twice daily for completion of his antibiotic duration ? ? ?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[place]) ?-Well controlled, A1C of 6.5 in 01/2022 ?-Continue to Hold metformin while inpatient ?-C/w  Sensitive Novolog SSI AC and accuchecks  ?-CBGs ranging from 121-185 ? ?Essential hypertension ?-Well controlled-soft pressures in setting of sepsis ?-Holding home meds for now (micardis '80mg'$  and novasc '5mg'$  daily? ?-Will D/C Home Amlodipine given New Diagnosis of HFrEF ?-C/w Metoprolol Tartrate 25 mg po BID ?-Continue to Monitor BP per Protocol ?-Last BP reading was 153/87 ? ?Gastroesophageal reflux disease ?-Continue Pantoprazole 40 mg IV q24h but likely can change to po and resume Home regimen for discharge ? ?Mixed hyperlipidemia ?-Continue Statin with Atorvastatin 40 mg po daily  ? ?Chronic obstructive pulmonary disease (HCC) ?-Stable,  ?-No active Bronchospasm ?-Avoiding  Albuterol with no wheezing to avoid worsening RVR  ?-Currently not on any inhalers  ? ?Bipolar disorder ([place]) ?-Stable and Quiescent  ? ?Abdominal aortic aneurysm ([place]) ?-Infrarenal AAA measuring 4cm, small increase since 2021 [place] ?-Recommend routine f/u with vascular  ? ?Hyperbilirubinemia ?- Mild and discussed with gastroenterology and they recommend repeating CMP in outpatient setting within 1 week ?-Vascular to address to see if the drain is blocked at all ? ?Normocytic anemia ?-Patient's Hgb/Hct went from 14.1/41.3 -> 12.0/37.1 -> 12.8/38.4 -> 12.6/37.4 and is now 12.4/36.9 and stable ?-Check Anemia Panel in the outpatient setting ?-Continue to Monitor for S/Sx of Bleeding; No overt bleeding noted ?-Repeat CBC within 1 week  ? ?Hypoxia ?-Unclear Etiology ?-Resolved on 04/28/22 ?-Check CXR if reoccurs  ? ?PAF (paroxysmal atrial fibrillation) ([place]) ?New Onset A Fib with RVR ?-Precipitated by sepsis. Quickly converted with dilt gtt, remains in NSR with metoprolol.  ?-Initiated low dose metoprolol. Continue tartrate  formulation due to soft BP. Started amio gtt, per cardiology planning to convert to po 5/3.  ?-Avoid CCB with LV systolic dysfunction. ?-Currently on a heparin drip but we will be changing this to p.o. Eliquis given that there is no objections from the IR side or surgery side ?- Started heparin IV since recurrent AFib. Will no longer require outpatient cardiac monitoring. Benefits check requested for DOACs. Monitor CBC/for bleeding ?-Patient will follow up with his cardiologist in the next few weeks and repeat echocardiogram in outpatient setting.  Cardiology recommended continuing amiodarone and written for taper and recommended continue metoprolol tartrate as well as p.o. apixaban ? ?Acute systolic heart failure ([place]) ?-Patient's echocardiogram on 04/26/2022 showed an EF of 40 to 45% with global hypokinesis and grade 1 diastolic dysfunction and mild MR and mild to moderate TR and had no prior to compare to ?-Currently now euvolemic and no diuretic required ?-BB as above and cardiology considering consolidating metoprolol and rhythm issues settle down ?-Holding GDMT due to his BP swinging to the softer side; he may need BP room to advance AV blocking agents ?-Cardiology is following and recommending repeating echo in a month as they suspect his LVEF is reduced in the setting of sepsis with cholecystitis and new onset A-fib. ?-They are recommending outpatient ischemic evaluation if his LVEF remains reduced; currently he has not had any ischemic symptoms  ?-Follow-up with cardiology in outpatient setting and cardiology recommends repeating echocardiogram and continuing metoprolol for discharge ? ?Acute calculous cholecystitis ?-See Sepsis  ? ?Consultants: General Surgery, Gastroenterology, IR,  Cardiology  ?Procedures performed: Cholecystostomy Drain Placement, ECHO  ?Disposition: Home ?Diet recommendation:  ?Discharge Diet Orders (From admission, onward)  ? ?  Start     Ordered  ? 04/30/22 0000  Diet - low sodium  heart healthy       ? 04/30/22 1452  ? 04/30/22 0000  Diet Carb Modified       ? 04/30/22 1452  ? ?  ?  ? ?  ? ?Cardiac and Carb modified diet ?DISCHARGE MEDICATION: ?Allergies as of 04/30/2022   ?No Known Allergies

## 2022-04-30 NOTE — TOC Transition Note (Signed)
Transition of Care (TOC) - CM/SW Discharge Note ? ? ?Patient Details  ?Name: Douglas Hurst. ?MRN: [id number] ?Date of Birth: 11-18-1941 ? ?Transition of Care [facility]) CM/SW Contact:  ?Sharin Mons, RN ?Phone Number: ?04/30/2022, 2:41 PM ? ? ?Clinical Narrative:    ?Patient will DC to: home ?Anticipated DC date: 04/30/2022 ?Family notified: yes ?Transport by: car ? ?      - s/p cholecystostomy, percutaneous cholecystostomy placed 4/30,   ?Per MD patient ready for DC today. RN, patient,  and patient's wife aware of DC.  ?Referral made with [employer] for rolator and 3in1/BSC.  ?Post hospital f/u noted on AVS. ? ?Pt without Rx med concerns. [facility] will deliver Rx meds to beside prior to d/c. ? ?Wife to provide transportation to home. ? ?RNCM will sign off for now as intervention is no longer needed. Please consult [place] again if new needs arise.  ? ? ?  ?  ? ? ?Patient Goals and CMS Choice ?  ?  ?Choice offered to / list presented to : Patient ? ?Discharge Placement ?  ?           ?  ?  ?  ?  ? ?Discharge Plan and Services ?  ?Discharge Planning Services: CM Consult ?           ?DME Arranged: 3-N-1, Walker rolling with seat ?DME Agency: [employer] ?Date DME Agency Contacted: 04/30/22 ?Time DME Agency Contacted: 1440 ?Representative spoke with at DME Agency: [place] ?  ?  ?  ?  ?  ? ?Social Determinants of Health (SDOH) Interventions ?  ? ? ?Readmission Risk Interventions ?   ? View : No data to display.  ?  ?  ?  ? ? ? ? ? ?

## 2022-04-30 NOTE — Assessment & Plan Note (Signed)
-   Mild and discussed with gastroenterology and they recommend repeating CMP in outpatient setting within 1 week ?-Vascular to address to see if the drain is blocked at all ?

## 2022-04-30 NOTE — Discharge Instructions (Signed)
Flush the catheter daily with 5 ml normal saline.  ?Empty the bag at least once daily and document on a piece of paper the date, time and amount emptied.  ?Change the dressing daily or as needed.  ?Call Interventional Radiology at [phone] with any questions or concerns! Call us if the drain does not have any output for two days, if the skin insertion site becomes red, painful or has excessive drainage. Call us if the suture comes loose from the skin.  ?A scheduler from our office will call you with an appointment date/time to be seen in approximately 6 weeks for a drain exchange.  ?

## 2022-04-30 NOTE — Progress Notes (Signed)
? ? ?Referring Physician(s): Dr. Thermon Leyland ? ?Supervising Physician: Daryll Brod ?Daryll Brod ?Patient Status:  [facility] - In-pt ? ?Chief Complaint: Acute cholecystitis s/p percutaneous cholecystostomy placed in IR 04/26/22 by Dr. Maryelizabeth Kaufmann  ? ?Subjective: Patient in bed resting comfortably. His wife is in the room. They are both nervous about going home with the drain/drain care. The patient states he feels much better and has minimal pain.  ? ?Allergies: ?Patient has no known allergies. ? ?Medications: ?Prior to Admission medications   ?Medication Sig Start Date End Date Taking? Authorizing Provider  ?amLODipine (NORVASC) 5 MG tablet Take 5 mg by mouth daily. 03/31/22  Yes [provider]  ?aspirin EC 81 MG tablet Take 81 mg by mouth daily. Swallow whole.   Yes [provider]  ?atorvastatin (LIPITOR) 40 MG tablet Take 40 mg by mouth daily.   Yes [provider]  ?metFORMIN (GLUCOPHAGE-XR) 500 MG 24 hr tablet Take 500-1,000 mg by mouth See admin instructions. Take 2 tablets (1000 mg) in the morning and 1 tablet (500 mg) at night 03/17/22  Yes [provider]  ?Multiple Vitamin (MULTIVITAMIN) tablet Take 1 tablet by mouth daily.   Yes [provider]  ?pantoprazole (PROTONIX) 40 MG tablet Take 40 mg by mouth daily.   Yes [provider]  ?tadalafil (CIALIS) 20 MG tablet Take 20 mg by mouth every three (3) days as needed for erectile dysfunction. 03/29/22  Yes [provider]  ?telmisartan (MICARDIS) 80 MG tablet Take 80 mg by mouth daily. 02/23/22  Yes [provider]  ?acetaminophen (TYLENOL) 325 MG tablet Take 2 tablets (650 mg total) by mouth every 6 (six) hours as needed for mild pain (or Fever >/= 101). 04/30/22   Kerney Elbe, DO  ?amiodarone (PACERONE) 200 MG tablet Take 1 tablet (200 mg total) by mouth 2 (two) times daily for 7 days, THEN 1 tablet (200 mg total) daily. 04/30/22 06/06/22  Raiford Noble Latif, DO  ?apixaban (ELIQUIS) 5 MG  TABS tablet Take 1 tablet (5 mg total) by mouth 2 (two) times daily. 04/30/22   Raiford Noble Latif, DO  ?cefadroxil (DURICEF) 500 MG capsule Take 2 capsules (1,000 mg total) by mouth 2 (two) times daily for 8 days. 04/30/22 05/08/22  Raiford Noble Latif, DO  ?metoprolol tartrate (LOPRESSOR) 25 MG tablet Take 1 tablet (25 mg total) by mouth 2 (two) times daily. 04/30/22   Raiford Noble Latif, DO  ?ondansetron (ZOFRAN) 4 MG tablet Take 1 tablet (4 mg total) by mouth every 6 (six) hours as needed for nausea. 04/30/22   Raiford Noble Latif, DO  ?polyethylene glycol powder (GLYCOLAX/MIRALAX) 17 GM/SCOOP powder Take 17 g by mouth daily. 04/30/22   Raiford Noble Latif, DO  ?senna-docusate (SENOKOT-S) 8.6-50 MG tablet Take 1 tablet by mouth at bedtime. 04/30/22   Raiford Noble Latif, DO  ?sodium chloride flush (NS) 0.9 % SOLN 5 mLs by Intracatheter route 2 (two) times daily. 04/30/22   Kerney Elbe, DO  ? ? ? ?Vital Signs: ?BP (!) 153/87 (BP Location: Right Arm)   Pulse 68   Temp 97.7 ?F (36.5 ?C) (Oral)   Resp 17   Ht '6\' 2"'$  (1.88 m)   Wt 186 lb 15.2 oz (84.8 kg)   SpO2 97%   BMI 24.00 kg/m?  ? ?Physical Exam ?Constitutional:   ?   General: He is not in acute distress. ?   Appearance: He is not ill-appearing.  ?Pulmonary:  ?   Effort: Pulmonary effort is  normal.  ?Abdominal:  ?   Palpations: Abdomen is soft.  ?   Tenderness: There is no abdominal tenderness.  ?   Comments: RUQ drain to bulb - now to gravity bag. Easily flushed with 5 ml NS. Suture in place. Dressing is clean/dry/intact. Skin insertion site is slightly pink but otherwise unremarkable.   ?Skin: ?   General: Skin is warm and dry.  ?Neurological:  ?   Mental Status: He is alert and oriented to person, place, and time.  ? ? ?Imaging: ?ECHOCARDIOGRAM COMPLETE ? ?Result Date: 04/26/2022 ?   ECHOCARDIOGRAM REPORT   Patient Name:   Douglas Hurst. Date of Exam: 04/26/2022 Medical Rec #:  [id number]          Height:       74.0 in Accession #:    [id number]          Weight:       186.9 lb Date of Birth:  1941/05/06         BSA:          2.112 m? Patient Age:    29 years           BP:           92/62 mmHg Patient Gender: M                  HR:           76 bpm. Exam Location:  Inpatient Procedure: 2D Echo, Cardiac Doppler and Color Doppler Indications:    Abnl ECG  History:        Patient has no prior history of Echocardiogram examinations.  Sonographer:    Wenda Low Referring Phys: [place]  1. Left ventricular ejection fraction, by estimation, is 40 to 45%. The left ventricle has mildly decreased function. The left ventricle demonstrates global hypokinesis. There is mild left ventricular hypertrophy. Left ventricular diastolic parameters are consistent with Grade I diastolic dysfunction (impaired relaxation).  2. Right ventricular systolic function is normal. The right ventricular size is normal. There is normal pulmonary artery systolic pressure.  3. The mitral valve is abnormal. Mild mitral valve regurgitation. No evidence of mitral stenosis. Moderate mitral annular calcification.  4. The tricuspid valve is abnormal. Tricuspid valve regurgitation is mild to moderate.  5. The aortic valve is tricuspid. There is moderate calcification of the aortic valve. There is moderate thickening of the aortic valve. Aortic valve regurgitation is not visualized. No aortic stenosis is present.  6. The inferior vena cava is dilated in size with >50% respiratory variability, suggesting right atrial pressure of 8 mmHg. FINDINGS  Left Ventricle: Left ventricular ejection fraction, by estimation, is 40 to 45%. The left ventricle has mildly decreased function. The left ventricle demonstrates global hypokinesis. The left ventricular internal cavity size was normal in size. There is  mild left ventricular hypertrophy. Left ventricular diastolic parameters are consistent with Grade I diastolic dysfunction (impaired relaxation). Normal left ventricular filling  pressure. Right Ventricle: The right ventricular size is normal. Right vetricular wall thickness was not well visualized. Right ventricular systolic function is normal. There is normal pulmonary artery systolic pressure. The tricuspid regurgitant velocity is 2.63 m/s, and with an assumed right atrial pressure of 8 mmHg, the estimated right ventricular systolic pressure is 97.6 mmHg. Left Atrium: Left atrial size was normal in size. Right Atrium: Right atrial size was normal in size. Pericardium: There is no evidence of pericardial effusion. Mitral Valve: The  mitral valve is abnormal. There is mild thickening of the mitral valve leaflet(s). There is mild calcification of the mitral valve leaflet(s). Moderate mitral annular calcification. Mild mitral valve regurgitation. No evidence of mitral  valve stenosis. MV peak gradient, 4.2 mmHg. The mean mitral valve gradient is 1.0 mmHg. Tricuspid Valve: The tricuspid valve is abnormal. Tricuspid valve regurgitation is mild to moderate. No evidence of tricuspid stenosis. Aortic Valve: The aortic valve is tricuspid. There is moderate calcification of the aortic valve. There is moderate thickening of the aortic valve. There is moderate aortic valve annular calcification. Aortic valve regurgitation is not visualized. No aortic stenosis is present. Aortic valve mean gradient measures 4.0 mmHg. Aortic valve peak gradient measures 7.4 mmHg. Aortic valve area, by VTI measures 2.80 cm?. Pulmonic Valve: The pulmonic valve was not well visualized. Pulmonic valve regurgitation is not visualized. No evidence of pulmonic stenosis. Aorta: The aortic root is normal in size and structure. Venous: The inferior vena cava is dilated in size with greater than 50% respiratory variability, suggesting right atrial pressure of 8 mmHg. IAS/Shunts: No atrial level shunt detected by color flow Doppler.  LEFT VENTRICLE PLAX 2D LVIDd:         4.90 cm     Diastology LVIDs:         3.90 cm     LV e'  medial:    6.85 cm/s LV PW:         1.10 cm     LV E/e' medial:  12.3 LV IVS:        1.30 cm     LV e' lateral:   5.77 cm/s LVOT diam:     2.00 cm     LV E/e' lateral: 14.6 LV SV:         71 LV SV Index:   33 LVOT Area:     3.

## 2022-04-30 NOTE — TOC Benefit Eligibility Note (Signed)
Patient Advocate Encounter ? ?Insurance verification completed.   ? ?The patient is currently admitted and upon discharge could be taking Eliquis '5mg'$  tablets. ? ?The current 30 day co-pay is, $45.  ? ?The patient is insured through [place] [employer]  ? ? ?Lady Deutscher, CPhT-Adv ?Pharmacy Patient Advocate Specialist ?[place] Patient Advocate Team ?Direct Number: [phone]  Fax: [phone] ? ? ? ? ? ? ? ?

## 2022-04-30 NOTE — TOC Initial Note (Signed)
Transition of Care (TOC) - Initial/Assessment Note  ? ? ?Patient Details  ?Name: Douglas Hurst. ?MRN: [id number] ?Date of Birth: Dec 15, 1941 ? ?Transition of Care [facility]) CM/SW Contact:    ?Sharin Mons, RN ?Phone Number: ?04/30/2022, 10:50 AM ? ?Clinical Narrative:    ?Presented with sepsis secondary to calculus of bile duct with acute cholecystitis.  ?   - s/p cholecystostomy, percutaneous cholecystostomy placed 4/30,   ?   - ?From home with wife. PTA independent with ADL's, no DME usage. ? ?TOC team monitoring and following for TOC needs.... ? ?Expected Discharge Plan: Home/Self Care ?  ? ? ?Patient Goals and CMS Choice ?  ?  ?  ? ?Expected Discharge Plan and Services ?Expected Discharge Plan: Home/Self Care ?  ?Discharge Planning Services: CM Consult ?  ?Living arrangements for the past 2 months: [place] ?                ?  ?  ?  ?  ?  ?  ?  ?  ?  ?  ? ?Prior Living Arrangements/Services ?Living arrangements for the past 2 months: [place] ?Lives with:: Spouse ?Patient language and need for interpreter reviewed:: Yes ?Do you feel safe going back to the place where you live?: Yes      ?Need for Family Participation in Patient Care: Yes (Comment) ?Care giver support system in place?: Yes (comment) ?  ?Criminal Activity/Legal Involvement Pertinent to Current Situation/Hospitalization: No - Comment as needed ? ?Activities of Daily Living ?Home Assistive Devices/Equipment: None ?ADL Screening (condition at time of admission) ?Patient's cognitive ability adequate to safely complete daily activities?: Yes ?Is the patient deaf or have difficulty hearing?: Yes ?Does the patient have difficulty seeing, even when wearing glasses/contacts?: No ?Does the patient have difficulty concentrating, remembering, or making decisions?: No ?Patient able to express need for assistance with ADLs?: Yes ?Does the patient have difficulty dressing or bathing?: Yes ?Independently performs ADLs?: No ?Communication:  Independent ?Dressing (OT): Needs assistance ?Is this a change from baseline?: Change from baseline, expected to last <3days ?Grooming: Needs assistance ?Is this a change from baseline?: Change from baseline, expected to last <3 days ?Feeding: Independent ?Bathing: Needs assistance ?Is this a change from baseline?: Change from baseline, expected to last <3 days ?Toileting: Needs assistance ?Is this a change from baseline?: Change from baseline, expected to last <3 days ?In/Out Bed: Needs assistance ?Is this a change from baseline?: Change from baseline, expected to last <3 days ?Walks in Home: Needs assistance ?Is this a change from baseline?: Change from baseline, expected to last <3 days ?Does the patient have difficulty walking or climbing stairs?: Yes ?Weakness of Legs: Both ?Weakness of Arms/Hands: None ? ?Permission Sought/Granted ?  ?Permission granted to share information with : Yes, Verbal Permission Granted ? Share Information with NAME: Satoshi Kalas (Spouse) [phone] ?   ?   ?   ? ?Emotional Assessment ?Appearance:: Appears stated age ?Attitude/Demeanor/Rapport: Engaged ?Affect (typically observed): Accepting ?Orientation: : Oriented to Self, Oriented to Place, Oriented to  Time, Oriented to Situation ?Alcohol / Substance Use: Not Applicable ?Psych Involvement: No (comment) ? ?Admission diagnosis:  Cholecystitis [K81.9] ?Calculus of bile duct (any) with acute cholecystitis [K80.42] ?Acute calculous cholecystitis [K80.00] ?Patient Active Problem List  ? Diagnosis Date Noted  ? Hypoxia 04/29/2022  ? Normocytic anemia 04/29/2022  ? Acute systolic heart failure ([place])   ? PAF (paroxysmal atrial fibrillation) ([place])   ? Acute calculous cholecystitis 04/26/2022  ?  Abdominal aortic aneurysm ([place]) 04/25/2022  ? Bipolar disorder ([employer]) 04/25/2022  ? Chronic obstructive pulmonary disease ([place]) 04/25/2022  ? 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[place]) 04/25/2022  ?  Gastroesophageal reflux disease 04/25/2022  ? Essential hypertension 04/25/2022  ? Mixed hyperlipidemia 04/25/2022  ? sepsis secondary to calculus of bile duct (any) with acute cholecystitis 04/25/2022  ? Sepsis ([place]) 04/25/2022  ? ?PCP:  Lavone Orn, MD ?Pharmacy:   ?[id number] - [place], [place] ?[address] ?[place] [address] ?Phone: [phone] Fax: [phone] ? ? ? ? ?Social Determinants of Health (SDOH) Interventions ?  ? ?Readmission Risk Interventions ?   ? View : No data to display.  ?  ?  ?  ? ? ? ?

## 2022-04-30 NOTE — Progress Notes (Signed)
Mobility Specialist Progress Note  ? ? 04/30/22 1319  ?Mobility  ?Activity Ambulated with assistance in hallway  ?Level of Assistance Contact guard assist, steadying assist  ?Assistive Device Front wheel walker  ?Distance Ambulated (ft) 140 ft  ?Activity Response Tolerated well  ?$Mobility charge 1 Mobility  ? ?During Mobility: 84 HR ? ?Pt received in bed and agreeable. No complaints on walk. Returned to bed with call bell in reach.   ? ?Hildred Alamin ?Mobility Specialist  ?Primary: 5N M.S. Phone: [phone] ?Secondary: 6N M.S. Phone: [phone] ?  ?

## 2022-05-01 ENCOUNTER — Other Ambulatory Visit (HOSPITAL_COMMUNITY): Payer: Self-pay

## 2022-05-01 LAB — AEROBIC/ANAEROBIC CULTURE W GRAM STAIN (SURGICAL/DEEP WOUND): Gram Stain: NONE SEEN

## 2022-05-04 ENCOUNTER — Other Ambulatory Visit (HOSPITAL_COMMUNITY): Payer: Self-pay

## 2022-05-06 ENCOUNTER — Other Ambulatory Visit (HOSPITAL_COMMUNITY): Payer: Self-pay

## 2022-05-08 DIAGNOSIS — E782 Mixed hyperlipidemia: Secondary | ICD-10-CM | POA: Diagnosis not present

## 2022-05-08 DIAGNOSIS — E1140 Type 2 diabetes mellitus with diabetic neuropathy, unspecified: Secondary | ICD-10-CM | POA: Diagnosis not present

## 2022-05-08 DIAGNOSIS — J449 Chronic obstructive pulmonary disease, unspecified: Secondary | ICD-10-CM | POA: Diagnosis not present

## 2022-05-08 DIAGNOSIS — I10 Essential (primary) hypertension: Secondary | ICD-10-CM | POA: Diagnosis not present

## 2022-05-08 DIAGNOSIS — N400 Enlarged prostate without lower urinary tract symptoms: Secondary | ICD-10-CM | POA: Diagnosis not present

## 2022-05-08 DIAGNOSIS — I480 Paroxysmal atrial fibrillation: Secondary | ICD-10-CM | POA: Diagnosis not present

## 2022-05-08 DIAGNOSIS — K219 Gastro-esophageal reflux disease without esophagitis: Secondary | ICD-10-CM | POA: Diagnosis not present

## 2022-05-12 ENCOUNTER — Ambulatory Visit (HOSPITAL_COMMUNITY): Payer: Medicare HMO | Attending: Internal Medicine

## 2022-05-12 DIAGNOSIS — I5021 Acute systolic (congestive) heart failure: Secondary | ICD-10-CM | POA: Diagnosis not present

## 2022-05-12 LAB — ECHOCARDIOGRAM COMPLETE
AR max vel: 2.58 cm2
AV Area VTI: 2.65 cm2
AV Area mean vel: 2.58 cm2
AV Mean grad: 6 mmHg
AV Peak grad: 13 mmHg
Ao pk vel: 1.8 m/s
Area-P 1/2: 3.36 cm2
P 1/2 time: 372 msec
S' Lateral: 2.9 cm

## 2022-05-13 DIAGNOSIS — N401 Enlarged prostate with lower urinary tract symptoms: Secondary | ICD-10-CM | POA: Diagnosis not present

## 2022-05-13 DIAGNOSIS — J449 Chronic obstructive pulmonary disease, unspecified: Secondary | ICD-10-CM | POA: Diagnosis not present

## 2022-05-13 DIAGNOSIS — I480 Paroxysmal atrial fibrillation: Secondary | ICD-10-CM | POA: Diagnosis not present

## 2022-05-13 DIAGNOSIS — R5381 Other malaise: Secondary | ICD-10-CM | POA: Diagnosis not present

## 2022-05-13 DIAGNOSIS — I110 Hypertensive heart disease with heart failure: Secondary | ICD-10-CM | POA: Diagnosis not present

## 2022-05-13 DIAGNOSIS — I5189 Other ill-defined heart diseases: Secondary | ICD-10-CM | POA: Diagnosis not present

## 2022-05-13 DIAGNOSIS — I714 Abdominal aortic aneurysm, without rupture, unspecified: Secondary | ICD-10-CM | POA: Diagnosis not present

## 2022-05-13 DIAGNOSIS — Z8719 Personal history of other diseases of the digestive system: Secondary | ICD-10-CM | POA: Diagnosis not present

## 2022-05-14 ENCOUNTER — Other Ambulatory Visit (HOSPITAL_COMMUNITY): Payer: Self-pay

## 2022-05-14 MED ORDER — NORMAL SALINE FLUSH 0.9 % IV SOLN
INTRAVENOUS | 0 refills | Status: DC
  Filled 2022-05-14: qty 300, 30d supply, fill #0

## 2022-05-15 ENCOUNTER — Other Ambulatory Visit (HOSPITAL_COMMUNITY): Payer: Self-pay | Admitting: Radiology

## 2022-05-15 ENCOUNTER — Ambulatory Visit (HOSPITAL_COMMUNITY)
Admission: RE | Admit: 2022-05-15 | Discharge: 2022-05-15 | Disposition: A | Discharge status: Home / Self Care | Payer: Medicare HMO | Source: Ambulatory Visit | Attending: Physician Assistant | Admitting: Physician Assistant

## 2022-05-15 ENCOUNTER — Other Ambulatory Visit (HOSPITAL_COMMUNITY): Payer: Self-pay | Admitting: Diagnostic Radiology

## 2022-05-15 DIAGNOSIS — K8000 Calculus of gallbladder with acute cholecystitis without obstruction: Secondary | ICD-10-CM | POA: Insufficient documentation

## 2022-05-15 DIAGNOSIS — Z434 Encounter for attention to other artificial openings of digestive tract: Secondary | ICD-10-CM | POA: Diagnosis not present

## 2022-05-15 HISTORY — PX: IR EXCHANGE BILIARY DRAIN: IMG6046

## 2022-05-15 MED ORDER — LIDOCAINE HCL 1 % IJ SOLN
INTRAMUSCULAR | Status: AC
  Administered 2022-05-15: 4 mL
  Filled 2022-05-15: qty 20

## 2022-05-15 MED ORDER — AMOXICILLIN-POT CLAVULANATE 875-125 MG PO TABS: 1.0000 | ORAL_TABLET | Freq: Two times a day (BID) | ORAL | 0 refills | Status: AC

## 2022-05-15 MED ORDER — AMOXICILLIN-POT CLAVULANATE 875-125 MG PO TABS: 1.0000 | ORAL_TABLET | Freq: Two times a day (BID) | ORAL | 0 refills | Status: DC

## 2022-05-15 NOTE — Progress Notes (Signed)
Purulent output noted to be in gravity bag. [facility] contracted. Recommend Augmentin 7 days. Prescription called into [facility] per wife's request. Patient and wife verbalized understanding and are in agreement with the plan of care.

## 2022-05-15 NOTE — Procedures (Signed)
Interventional Radiology Procedure:   Indications: Pain at cholecystostomy tube site and minimal output  Procedure: Drain injection and exchange  Findings: Yellow purulent output from gallbladder.  Drain is well positioned in gallbladder.  Multiple filling defects in gallbladder.  Complications: No immediate complications noted.     EBL: Minimal  Plan: Will send fluid for culture and discuss with surgery about need for antibiotics.    Sofie Schendel R. Anselm Pancoast, MD  Pager: [phone]

## 2022-05-19 ENCOUNTER — Other Ambulatory Visit (HOSPITAL_COMMUNITY): Payer: Self-pay

## 2022-05-19 ENCOUNTER — Telehealth (HOSPITAL_COMMUNITY): Payer: Self-pay

## 2022-05-19 NOTE — Telephone Encounter (Signed)
Pharmacy Transitions of Care Follow-up Telephone Call  Date of discharge: 04/30/22  Discharge Diagnosis: Afib w/ RVR  How have you been since you were released from the hospital? Had to go back to [facility] d/t leakage from bag. Received antibiotic at this encounter -- still taking antibiotic. Receiving some help at home.   Medication changes made at discharge: START taking: acetaminophen (TYLENOL)  amiodarone (PACERONE)  Start taking on: Apr 30, 2022 Eliquis (apixaban)  metoprolol tartrate (LOPRESSOR)  ondansetron (ZOFRAN)  polyethylene glycol powder (GLYCOLAX/MIRALAX)  Senexon-S (senna-docusate)  sodium chloride flush (NS)  STOP taking: amLODipine 5 MG tablet (NORVASC)  aspirin EC 81 MG tablet  telmisartan 80 MG tablet (MICARDIS)   Medication changes verified by the patient? Yes  Medication Accessibility:  [facility] [address]    Was the patient provided with refills on discharged medications? No   Have all prescriptions been transferred from [facility] to home pharmacy? N/A   Is the patient able to afford medications? Yes  Medication Review:  APIXABAN (ELIQUIS)  Apixaban 5 mg PO BID - Discussed importance of taking medication around the same time everyday  - Reviewed potential DDIs with patient  - Advised patient of medications to avoid (NSAIDs, ASA)  - Educated that Tylenol (acetaminophen) will be the preferred analgesic to prevent risk of bleeding  - Emphasized importance of monitoring for signs and symptoms of bleeding (abnormal bruising, prolonged bleeding, nose bleeds, bleeding from gums, discolored urine, black tarry stools)  - Advised patient to alert all providers of anticoagulation therapy prior to starting a new medication or having a procedure   Follow-up Appointments:  PCP Hospital f/u appt confirmed?  Scheduled to see Loel Dubonnet, NP on 05/29/22 @ 10:55 am.   If their condition worsens, is the pt aware to call PCP or go to the Emergency  Dept.? Yes  Final Patient Assessment: -Pt is doing well.  -Pt's wife verbalized understanding of medication changes.  -Pt has post discharge appointment.

## 2022-05-20 LAB — AEROBIC/ANAEROBIC CULTURE W GRAM STAIN (SURGICAL/DEEP WOUND)

## 2022-05-23 ENCOUNTER — Telehealth: Payer: Self-pay | Admitting: Physician Assistant

## 2022-05-23 NOTE — Telephone Encounter (Signed)
Received call from patient's wife Jamas Lav [phone]) regarding concern for drain insertion site leakage. Per chart review patient underwent percutaneous cholecystostomy placement. 04/26/22 for acute cholecystitis and was seen 05/15/22 for minimal drain output and fluid draining around the catheter. Per procedure note on 05/15/22:  Yellow thick fluid was aspirated from the gallbladder and concern for purulent material. The old drain was positioned in the gallbladder with multiple filling defects in the gallbladder suggestive for stones and/or sludge. There was filling of the cystic duct with some contrast filling the intrahepatic bile ducts. No significant contrast identified in the duodenum or distal common bile duct. Fluid was sent for culture and the patient was started on oral Augmentin.  Per Jamas Lav patient finished Augmentin without issue, he has been generally feeling well overall with some waxing and waning appetite. In general he does not eat much but today she noted that he ate very little breakfast. He denies abdominal pain, chest pain, n/v, diarrhea, fevers or chills. He tells her that his breakfast "didn't taste right" and that is why he did not want to eat much.   Output from the drain remains minimal -- between 5-20 cc QD, she describes the fluid as "milky brown with some small particles in it" which she states is how the fluid usually looks, she denies any blood in bag. She notes that the gauze covering the insertion site was about 1/2 damp with the milky brown fluid 2 days ago and today is about 1/4 damp with the same fluid. When the drain is flushed there is not leakage of flush material that she can see. The insertion site is a little red without open wounds or bleeding, it is not tender to touch. She does not think there is any purulent drainage.   Discussed that since patient is feeling well, the output amount and consistency does not appear to be significantly changed and the  leakage from the drain site is minimal she should continue to care for the drain as usual over the weekend and if the leakage persists to call our office so we can evaluate it before their appointment on 6/30. She was instructed to present to the ED this weekend if patient develops abdominal pain, persistent n/v, fevers, chills, change in mental status or any other concerns so that we may evaluate the drain.   Jamas Lav stated understanding and agreed with above. She knows to call our office with any further questions or concerns.  Candiss Norse, PA-C

## 2022-05-26 ENCOUNTER — Telehealth (HOSPITAL_COMMUNITY): Payer: Self-pay | Admitting: Physician Assistant

## 2022-05-26 ENCOUNTER — Ambulatory Visit (HOSPITAL_COMMUNITY)
Admission: RE | Admit: 2022-05-26 | Discharge: 2022-05-26 | Disposition: A | Discharge status: Home / Self Care | Payer: Medicare HMO | Source: Ambulatory Visit | Attending: Physician Assistant | Admitting: Physician Assistant

## 2022-05-26 ENCOUNTER — Other Ambulatory Visit (HOSPITAL_COMMUNITY): Payer: Self-pay | Admitting: Physician Assistant

## 2022-05-26 DIAGNOSIS — Z434 Encounter for attention to other artificial openings of digestive tract: Secondary | ICD-10-CM | POA: Diagnosis not present

## 2022-05-26 DIAGNOSIS — K8000 Calculus of gallbladder with acute cholecystitis without obstruction: Secondary | ICD-10-CM

## 2022-05-26 DIAGNOSIS — T85590A Other mechanical complication of bile duct prosthesis, initial encounter: Secondary | ICD-10-CM | POA: Diagnosis not present

## 2022-05-26 HISTORY — PX: IR EXCHANGE BILIARY DRAIN: IMG6046

## 2022-05-26 MED ORDER — LIDOCAINE HCL 1 % IJ SOLN
INTRAMUSCULAR | Status: AC
  Filled 2022-05-26: qty 20

## 2022-05-26 MED ORDER — IOHEXOL 300 MG/ML  SOLN
50.0000 mL | Freq: Once | INTRAMUSCULAR | Status: AC | PRN
  Administered 2022-05-26: 5 mL

## 2022-05-26 NOTE — Progress Notes (Signed)
Patient ID: Douglas Hurst., male   DOB: [birth year], 81 y.o.   MRN: [id number]   Received message from patient's wife Douglas Hurst [phone]) regarding concern for drain insertion site leakage.   Per chart review patient underwent percutaneous cholecystostomy placement. 04/26/22 for acute cholecystitis and was seen 05/15/22 for minimal drain output and fluid draining around the catheter, and had a drain exchange. There was some leakage around the catheter last week per phone note on 5/27 though there was still OP consistent with typical measurements.     At call back, I spoke with wife and home health RN Douglas Hurst.  RN reports normal vital signs, adequate mentation, and improvement in ambulation.  He denies abdominal pain, chest pain, n/v, diarrhea, fevers or chills. He endorses some tenderness at the insertion site.   Per RN, output from the drain has ceased, there is no leakage of biliary fluid around the drain.  Upon flushing, the saline leaks around the catheter at the level of the skin.  There is some redness, per his usual, around the insertion site.   Discussed that since the drain is no longer functioning, we ask to be able to evaluate it with injection in IR with possible exchange.  Scheduled for 3pm today.  RN and Douglas Hurst stated understanding and agreed with above.  Order was placed for cholangiogram in IR.  Electronically Signed: Pasty Spillers, PA-C 05/26/2022, 11:35 AM

## 2022-05-28 DIAGNOSIS — K8050 Calculus of bile duct without cholangitis or cholecystitis without obstruction: Secondary | ICD-10-CM | POA: Diagnosis not present

## 2022-05-28 DIAGNOSIS — K819 Cholecystitis, unspecified: Secondary | ICD-10-CM | POA: Diagnosis not present

## 2022-05-29 ENCOUNTER — Ambulatory Visit (HOSPITAL_BASED_OUTPATIENT_CLINIC_OR_DEPARTMENT_OTHER): Payer: Medicare HMO | Admitting: Family

## 2022-05-29 VITALS — BP 130/62 | HR 62 | Ht 74.0 in | Wt 189.6 lb

## 2022-05-29 DIAGNOSIS — D6859 Other primary thrombophilia: Secondary | ICD-10-CM | POA: Diagnosis not present

## 2022-05-29 DIAGNOSIS — Z79899 Other long term (current) drug therapy: Secondary | ICD-10-CM

## 2022-05-29 DIAGNOSIS — I5022 Chronic systolic (congestive) heart failure: Secondary | ICD-10-CM

## 2022-05-29 DIAGNOSIS — I7143 Infrarenal abdominal aortic aneurysm, without rupture: Secondary | ICD-10-CM | POA: Diagnosis not present

## 2022-05-29 DIAGNOSIS — I5021 Acute systolic (congestive) heart failure: Secondary | ICD-10-CM

## 2022-05-29 DIAGNOSIS — I480 Paroxysmal atrial fibrillation: Secondary | ICD-10-CM

## 2022-05-29 DIAGNOSIS — I77819 Aortic ectasia, unspecified site: Secondary | ICD-10-CM | POA: Diagnosis not present

## 2022-05-29 MED ORDER — METOPROLOL TARTRATE 25 MG PO TABS: 25.0000 mg | ORAL_TABLET | Freq: Two times a day (BID) | ORAL | 3 refills | Status: DC

## 2022-05-29 MED ORDER — AMIODARONE HCL 200 MG PO TABS: 200.0000 mg | ORAL_TABLET | Freq: Every day | ORAL | 3 refills | Status: DC

## 2022-05-29 MED ORDER — APIXABAN 5 MG PO TABS
5.0000 mg | ORAL_TABLET | Freq: Two times a day (BID) | ORAL | 3 refills | Status: DC
Start: 2022-05-29 — End: 2023-07-02

## 2022-05-29 NOTE — Progress Notes (Unsigned)
Office Visit    Patient Name: Douglas Hurst. Date of Encounter: 05/30/2022  PCP:  Lavone Orn, [place]  Cardiologist:  Skeet Latch, MD  Advanced Practice Provider:  No care team member to display Electrophysiologist:  None   Chief Complaint    Douglas Hurst. is a 81 y.o. male with a hx of HTN, DM 2, hyperlipidemia, GERD, BPH, COPD, AAA, heart failure, PAF, cholecystitis presents today for hospital follow up.    Past Medical History    Past Medical History:  Diagnosis Date   Bipolar disorder ([place])    Chronic back pain    COPD (chronic obstructive pulmonary disease) (H[facility])    Dr. Laurann [place]   Diverticulosis    ED (erectile dysfunction)    Hiatal hernia    Hyperlipidemia    Hypertension    Past Surgical History:  Procedure Laterality Date   BALLOON DILATION N/A 10/01/2015   Procedure: BALLOON DILATION;  Surgeon: Garlan Fair, MD;  Location: [facility];  Service: Endoscopy;  Laterality: N/A;   CATARACT EXTRACTION, BILATERAL     COLONOSCOPY     removed polyps   COLONOSCOPY WITH PROPOFOL N/A 09/01/2016   Procedure: COLONOSCOPY WITH PROPOFOL;  Surgeon: Garlan Fair, MD;  Location: [facility];  Service: Endoscopy;  Laterality: N/A;   ESOPHAGOGASTRODUODENOSCOPY (EGD) WITH PROPOFOL N/A 10/01/2015   Procedure: ESOPHAGOGASTRODUODENOSCOPY (EGD) WITH PROPOFOL;  Surgeon: Garlan Fair, MD;  Location: [facility];  Service: Endoscopy;  Laterality: N/A;   IR EXCHANGE BILIARY DRAIN  05/15/2022   IR EXCHANGE BILIARY DRAIN  05/26/2022   IR PERC CHOLECYSTOSTOMY  04/26/2022   KNEE ARTHROSCOPY     TONSILLECTOMY      Allergies  No Known Allergies  History of Present Illness    Douglas Hurst. is a 81 y.o. male with a hx of HTN, DM 2, hyperlipidemia, GERD, BPH, COPD, AAA, heart failure, PAF, cholecystitis last seen while hospitalized.  Presented to the ED 04/25/2022 with RUQ pain diagnosed with acute cholecystitis with 1.2 mm  gallstone in the neck of the gallbladder and 5 mm CBD stone with proximal biliary dilation.  Treated with IV fluid, Zosyn.  General surgery and GI recommended cholecystostomy tube for management.  Developed new onset atrial for with RVR with depressed LVEF 40-45%.  He had hypotension on metoprolol and was transition to amiodarone.  Subsequently converted to NSR.  He was discharged home 5//23.  Has since established with home health PT. he had repeat echocardiogram 05/12/2022 with LVEF 60 to 65%, no RWMA, grade 1 diastolic dysfunction, RV normal size and function, trivial MR/AI, mild dilation ascending aorta 40 mm  Pleasant gentleman who presents today for follow-up with his wife.  Does note that his primary care provider has started him on Spiriva but he is unsure what dose.  He is using as needed rather than daily.  He previously worked as a Development worker, community.  Blood pressures at home have been well controlled 130/62, 132/60.  He is feeling overall well since discharge with no chest pain, dyspnea.  Still with decreased energy which is gradually improving.  Still with cholecystostomy tube with plans for removal soon and potential gallbladder removal surgery.  EKGs/Labs/Other Studies Reviewed:   The following studies were reviewed today:   Echo 05/12/22 IMPRESSIONS   1. Left ventricular ejection fraction, by estimation, is 60 to 65%. The  left ventricle has normal function. The left ventricle has no regional  wall  motion abnormalities. Left ventricular diastolic parameters are  consistent with Grade I diastolic  dysfunction (impaired relaxation).   2. Right ventricular systolic function is normal. The right ventricular  size is normal. There is normal pulmonary artery systolic pressure. The  estimated right ventricular systolic pressure is 74.0 mmHg.   3. Left atrial size was mildly dilated.   4. The mitral valve is abnormal. Trivial mitral valve regurgitation.   5. The aortic valve is tricuspid. Aortic  valve regurgitation is trivial.  Aortic valve sclerosis is present, with no evidence of aortic valve  stenosis.   6. Aortic dilatation noted. There is mild dilatation of the ascending  aorta, measuring 40 mm.   7. The inferior vena cava is normal in size with greater than 50%  respiratory variability, suggesting right atrial pressure of 3 mmHg.   Comparison(s): Changes from prior study are noted. 4/60/2023: LVEF 40-45%.   Echo 04/26/22  1. Left ventricular ejection fraction, by estimation, is 40 to 45%. The  left ventricle has mildly decreased function. The left ventricle  demonstrates global hypokinesis. There is mild left ventricular  hypertrophy. Left ventricular diastolic parameters  are consistent with Grade I diastolic dysfunction (impaired relaxation).   2. Right ventricular systolic function is normal. The right ventricular  size is normal. There is normal pulmonary artery systolic pressure.   3. The mitral valve is abnormal. Mild mitral valve regurgitation. No  evidence of mitral stenosis. Moderate mitral annular calcification.   4. The tricuspid valve is abnormal. Tricuspid valve regurgitation is mild  to moderate.   5. The aortic valve is tricuspid. There is moderate calcification of the  aortic valve. There is moderate thickening of the aortic valve. Aortic  valve regurgitation is not visualized. No aortic stenosis is present.   6. The inferior vena cava is dilated in size with >50% respiratory  variability, suggesting right atrial pressure of 8 mmHg.   EKG: No EKG today.   Recent Labs: 04/28/2022: TSH 1.058 04/30/2022: ALT 14; BUN 13; Creatinine, Ser 0.82; Hemoglobin 12.4; Magnesium 2.0; Platelets 205; Potassium 4.3; Sodium 137  Recent Lipid Panel No results found for: CHOL, TRIG, HDL, CHOLHDL, VLDL, LDLCALC, LDLDIRECT  Risk Assessment/Calculations:   CHA2DS2-VASc Score = 5   This indicates a 7.2% annual risk of stroke. The patient's score is based upon: CHF History:  1 HTN History: 1 Diabetes History: 1 Stroke History: 0 Vascular Disease History: 0 Age Score: 2 Gender Score: 0     Home Medications   Current Meds  Medication Sig   acetaminophen (TYLENOL) 325 MG tablet Take 2 tablets (650 mg total) by mouth every 6 (six) hours as needed for mild pain (or Fever >/= 101).   atorvastatin (LIPITOR) 40 MG tablet Take 40 mg by mouth daily.   metFORMIN (GLUCOPHAGE-XR) 500 MG 24 hr tablet Take 500-1,000 mg by mouth See admin instructions. Take 2 tablets (1000 mg) in the morning and 1 tablet (500 mg) at night   Multiple Vitamin (MULTIVITAMIN) tablet Take 1 tablet by mouth daily.   ondansetron (ZOFRAN) 4 MG tablet Take 1 tablet (4 mg total) by mouth every 6 (six) hours as needed for nausea.   pantoprazole (PROTONIX) 40 MG tablet Take 40 mg by mouth daily.   polyethylene glycol powder (GLYCOLAX/MIRALAX) 17 GM/SCOOP powder Take 17 g by mouth daily.   senna-docusate (SENOKOT-S) 8.6-50 MG tablet Take 1 tablet by mouth at bedtime.   Sodium Chloride Flush (NORMAL SALINE FLUSH) 0.9 % SOLN Use to flush once daily.  sodium chloride flush (NS) 0.9 % SOLN 5 mLs by Intracatheter route 2 (two) times daily.   tadalafil (CIALIS) 20 MG tablet Take 20 mg by mouth every three (3) days as needed for erectile dysfunction.   tamsulosin (FLOMAX) 0.4 MG CAPS capsule Take 1 capsule by mouth daily.   [DISCONTINUED] amiodarone (PACERONE) 200 MG tablet Take 1 tablet (200 mg total) by mouth 2 (two) times daily for 7 days, THEN 1 tablet (200 mg total) daily.   [DISCONTINUED] apixaban (ELIQUIS) 5 MG TABS tablet Take 1 tablet (5 mg total) by mouth 2 (two) times daily.   [DISCONTINUED] metoprolol tartrate (LOPRESSOR) 25 MG tablet Take 1 tablet (25 mg total) by mouth 2 (two) times daily.     Review of Systems      All other systems reviewed and are otherwise negative except as noted above.  Physical Exam    VS:  BP 130/62 (BP Location: Right Arm, Patient Position: Sitting, Cuff Size:  Normal)   Pulse 62   Ht '6\' 2"'$  (1.88 m)   Wt 189 lb 9.6 oz (86 kg)   SpO2 96%   BMI 24.34 kg/m  , BMI Body mass index is 24.34 kg/m.  Wt Readings from Last 3 Encounters:  05/29/22 189 lb 9.6 oz (86 kg)  04/25/22 186 lb 15.2 oz (84.8 kg)  09/01/16 208 lb (94.3 kg)     GEN: Well nourished, well developed, in no acute distress. HEENT: normal. Neck: Supple, no JVD, carotid bruits, or masses. Cardiac: RRR, no murmurs, rubs, or gallops. No clubbing, cyanosis, edema.  Radials/PT 2+ and equal bilaterally.  Respiratory:  Respirations regular and unlabored, clear to auscultation bilaterally. GI: Soft, nontender, nondistended. MS: No deformity or atrophy. Skin: Warm and dry, no rash. Neuro:  Strength and sensation are intact. Psych: Normal affect.  Assessment & Plan    HFrEF - LVEF 40% in setting of sepsis with cholecystitis with new onset atrial fibrillation 03/2022. Repeat echo 04/2022 normal LVEF. Euvolemic and well compensated on exam. As LVEF recovered, no indication for ischemic evaluation.   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 Continue BP control. Consider repeat imaging in 1 year via echo or CT.   AAA -continue optimal BP control.  05/2018 3.6 infrarenal abdominal aortic aneurysm recommended for follow-up ultrasound in 2-years.  Uncertain whether he has had repeat duplex since that time and recommend continued follow-up with primary care.  PAF / On Amiodarone therapy / Hypercoagulable state -maintaining sinus rhythm by auscultation today.  Denies palpitations.  Continue Eliquis 5 mg twice daily.  Denies bleeding complications.  Does not meet dose reduction criteria.  Denies cost concerns with $45 per month co-pay. CHA2DS2-VASc Score = 5 [CHF History: 1, HTN History: 1, Diabetes History: 1, Stroke History: 0, Vascular Disease History: 0, Age Score: 2, Gender Score: 0].  Therefore, the patient's annual risk of stroke is 7.2 %.     Amiodarone monitoring : Discussed need for annual eye  exam.  TSH, CMP in 2 to 4 weeks for monitoring after completion of 6 weeks of therapy.     Disposition: Follow up in 3 month(s) with TSkeet Latch MD or APP.  Signed, CLoel Dubonnet NP 05/30/2022, 4:00 PM [place]

## 2022-05-29 NOTE — Patient Instructions (Addendum)
Medication Instructions:  Continue your current medications.   *If you need a refill on your cardiac medications before your next appointment, please call your pharmacy*   Lab Work: Your physician recommends that you return for lab work in 2-4 weeks for TSH, CBC, CMP. You do NOT need to be fasting.   Please return for Lab work. You may come to the...   [facility] (3rd floor) [address], [place], [place] [address]  Open: 8am-Noon and 1pm-4:30pm   [facility] [place]- Any location  **no appointments needed**   If you have labs (blood work) drawn today and your tests are completely normal, you will receive your results only by: [employer] (if you have MyChart) OR A paper copy in the mail If you have any lab test that is abnormal or we need to change your treatment, we will call you to review the results.   Testing/Procedures: None ordered today.   Your recent echocardiogram showed your heart pumping function was back to normal!!   Follow-Up: At [facility], you and your health needs are our priority.  As part of our continuing mission to provide you with exceptional heart care, we have created designated Provider Care Teams.  These Care Teams include your primary Cardiologist (physician) and Advanced Practice Providers (APPs -  Physician Assistants and Nurse Practitioners) who all work together to provide you with the care you need, when you need it.  We recommend signing up for the patient portal called "MyChart".  Sign up information is provided on this After Visit Summary.  MyChart is used to connect with patients for Virtual Visits (Telemedicine).  Patients are able to view lab/test results, encounter notes, upcoming appointments, etc.  Non-urgent messages can be sent to your provider as well.   To learn more about what you can do with MyChart, go to [email or web address].    Your next  appointment:   3 month(s)  The format for your next appointment:   In Person  Provider:   Skeet Latch, MD or Loel Dubonnet, NP     Other Instructions  Heart Healthy Diet Recommendations: A low-salt diet is recommended. Meats should be grilled, baked, or boiled. Avoid fried foods. Focus on lean protein sources like fish or chicken with vegetables and fruits. The American Heart Association is a Microbiologist!  American Heart Association Diet and Lifeystyle Recommendations   Exercise recommendations: The American Heart Association recommends 150 minutes of moderate intensity exercise weekly. Try 30 minutes of moderate intensity exercise 4-5 times per week. This could include walking, jogging, or swimming.   Important Information About Sugar

## 2022-05-30 ENCOUNTER — Encounter (HOSPITAL_BASED_OUTPATIENT_CLINIC_OR_DEPARTMENT_OTHER): Payer: Self-pay | Admitting: Family

## 2022-06-08 ENCOUNTER — Ambulatory Visit (HOSPITAL_COMMUNITY): Payer: Medicare HMO

## 2022-06-09 DIAGNOSIS — N401 Enlarged prostate with lower urinary tract symptoms: Secondary | ICD-10-CM | POA: Diagnosis not present

## 2022-06-09 DIAGNOSIS — E782 Mixed hyperlipidemia: Secondary | ICD-10-CM | POA: Diagnosis not present

## 2022-06-09 DIAGNOSIS — I10 Essential (primary) hypertension: Secondary | ICD-10-CM | POA: Diagnosis not present

## 2022-06-09 DIAGNOSIS — E1151 Type 2 diabetes mellitus with diabetic peripheral angiopathy without gangrene: Secondary | ICD-10-CM | POA: Diagnosis not present

## 2022-06-09 DIAGNOSIS — J449 Chronic obstructive pulmonary disease, unspecified: Secondary | ICD-10-CM | POA: Diagnosis not present

## 2022-06-09 DIAGNOSIS — Z0000 Encounter for general adult medical examination without abnormal findings: Secondary | ICD-10-CM | POA: Diagnosis not present

## 2022-06-09 DIAGNOSIS — F319 Bipolar disorder, unspecified: Secondary | ICD-10-CM | POA: Diagnosis not present

## 2022-06-09 DIAGNOSIS — Z1331 Encounter for screening for depression: Secondary | ICD-10-CM | POA: Diagnosis not present

## 2022-06-09 DIAGNOSIS — K219 Gastro-esophageal reflux disease without esophagitis: Secondary | ICD-10-CM | POA: Diagnosis not present

## 2022-06-09 DIAGNOSIS — I480 Paroxysmal atrial fibrillation: Secondary | ICD-10-CM | POA: Diagnosis not present

## 2022-06-09 DIAGNOSIS — I739 Peripheral vascular disease, unspecified: Secondary | ICD-10-CM | POA: Diagnosis not present

## 2022-06-10 ENCOUNTER — Other Ambulatory Visit (HOSPITAL_COMMUNITY): Payer: Self-pay | Admitting: Radiology

## 2022-06-10 ENCOUNTER — Encounter (HOSPITAL_COMMUNITY): Payer: Self-pay | Admitting: Gastroenterology

## 2022-06-10 ENCOUNTER — Other Ambulatory Visit (HOSPITAL_COMMUNITY): Payer: Self-pay | Admitting: Gastroenterology

## 2022-06-10 ENCOUNTER — Other Ambulatory Visit: Payer: Self-pay | Admitting: Gastroenterology

## 2022-06-10 ENCOUNTER — Other Ambulatory Visit (HOSPITAL_COMMUNITY): Payer: Self-pay | Admitting: Interventional Radiology

## 2022-06-10 ENCOUNTER — Other Ambulatory Visit: Payer: Self-pay

## 2022-06-10 DIAGNOSIS — T85518D Breakdown (mechanical) of other gastrointestinal prosthetic devices, implants and grafts, subsequent encounter: Secondary | ICD-10-CM

## 2022-06-10 DIAGNOSIS — K831 Obstruction of bile duct: Secondary | ICD-10-CM

## 2022-06-10 DIAGNOSIS — R935 Abnormal findings on diagnostic imaging of other abdominal regions, including retroperitoneum: Secondary | ICD-10-CM | POA: Diagnosis not present

## 2022-06-10 DIAGNOSIS — K8020 Calculus of gallbladder without cholecystitis without obstruction: Secondary | ICD-10-CM | POA: Diagnosis not present

## 2022-06-11 ENCOUNTER — Ambulatory Visit (HOSPITAL_COMMUNITY)
Admission: RE | Admit: 2022-06-11 | Discharge: 2022-06-11 | Disposition: A | Discharge status: Home / Self Care | Payer: Medicare HMO | Source: Ambulatory Visit | Attending: Radiology | Admitting: Radiology

## 2022-06-11 ENCOUNTER — Other Ambulatory Visit (HOSPITAL_COMMUNITY): Payer: Self-pay | Admitting: Radiology

## 2022-06-11 DIAGNOSIS — T85590A Other mechanical complication of bile duct prosthesis, initial encounter: Secondary | ICD-10-CM | POA: Diagnosis not present

## 2022-06-11 DIAGNOSIS — T85518D Breakdown (mechanical) of other gastrointestinal prosthetic devices, implants and grafts, subsequent encounter: Secondary | ICD-10-CM

## 2022-06-11 DIAGNOSIS — Y848 Other medical procedures as the cause of abnormal reaction of the patient, or of later complication, without mention of misadventure at the time of the procedure: Secondary | ICD-10-CM | POA: Insufficient documentation

## 2022-06-11 HISTORY — PX: IR EXCHANGE BILIARY DRAIN: IMG6046

## 2022-06-11 MED ORDER — LIDOCAINE HCL 1 % IJ SOLN
INTRAMUSCULAR | Status: AC
  Filled 2022-06-11: qty 20

## 2022-06-11 MED ORDER — IOHEXOL 300 MG/ML  SOLN
100.0000 mL | Freq: Once | INTRAMUSCULAR | Status: AC | PRN
  Administered 2022-06-11: 20 mL

## 2022-06-12 ENCOUNTER — Telehealth (HOSPITAL_BASED_OUTPATIENT_CLINIC_OR_DEPARTMENT_OTHER): Payer: Self-pay | Admitting: *Deleted

## 2022-06-12 NOTE — Telephone Encounter (Signed)
   Pre-operative Risk Assessment    Patient Name: Douglas Hurst.  DOB: 11/20/1941 MRN: [id number]      Request for Surgical Clearance    Procedure:   ERCP  CBD Stones  Date of Surgery:  Clearance 06/23/22                                 Surgeon:  Dr Watt Climes Surgeon's Group or Practice Name:  [facility] Phone number:  [phone] Fax number:  [phone]   Type of Clearance Requested:   - Medical  and Pharmacist   Type of Anesthesia:  MAC   Additional requests/questions:   HOLD Eliquis 2 days prior to procedure  [name]   06/12/2022, 11:18 AM

## 2022-06-12 NOTE — Telephone Encounter (Signed)
Patient with diagnosis of afib on Eliquis for anticoagulation.    Procedure: ERCP  CBD Stones Date of procedure: 06/23/22   CHA2DS2-VASc Score = 5   This indicates a 7.2% annual risk of stroke. The patient's score is based upon: CHF History: 1 HTN History: 1 Diabetes History: 1 Stroke History: 0 Vascular Disease History: 0 Age Score: 2 Gender Score: 0      CrCl 82 ml/min  Per office protocol, patient can hold Eliquis for 2 days prior to procedure.

## 2022-06-12 NOTE — Telephone Encounter (Signed)
   Primary Cardiologist: Skeet Latch, MD  Chart reviewed as part of pre-operative protocol coverage. Given past medical history and time since last visit, based on ACC/AHA guidelines, Douglas Hurst. would be at acceptable risk for the planned procedure without further cardiovascular testing.   Patient with diagnosis of afib on Eliquis for anticoagulation.     Procedure: ERCP  CBD Stones Date of procedure: 06/23/22     CHA2DS2-VASc Score = 5   This indicates a 7.2% annual risk of stroke. The patient's score is based upon: CHF History: 1 HTN History: 1 Diabetes History: 1 Stroke History: 0 Vascular Disease History: 0 Age Score: 2 Gender Score: 0       CrCl 82 ml/min   Per office protocol, patient can hold Eliquis for 2 days prior to procedure.   I will route this recommendation to the requesting party via Epic fax function and remove from pre-op pool.  Please call with questions.  Jossie Ng. Hasna Stefanik NP-C    06/12/2022, 1:11 PM [facility] [address] Office [phone] Fax [phone]

## 2022-06-15 DIAGNOSIS — I5021 Acute systolic (congestive) heart failure: Secondary | ICD-10-CM | POA: Diagnosis not present

## 2022-06-16 LAB — COMPREHENSIVE METABOLIC PANEL
ALT: 31 IU/L (ref 0–44)
AST: 24 IU/L (ref 0–40)
Albumin/Globulin Ratio: 1.8 (ref 1.2–2.2)
Albumin: 3.7 g/dL (ref 3.7–4.7)
Alkaline Phosphatase: 74 IU/L (ref 44–121)
BUN/Creatinine Ratio: 14 (ref 10–24)
BUN: 12 mg/dL (ref 8–27)
Bilirubin Total: 0.5 mg/dL (ref 0.0–1.2)
CO2: 23 mmol/L (ref 20–29)
Calcium: 9.1 mg/dL (ref 8.6–10.2)
Chloride: 102 mmol/L (ref 96–106)
Creatinine, Ser: 0.83 mg/dL (ref 0.76–1.27)
Globulin, Total: 2.1 g/dL (ref 1.5–4.5)
Glucose: 107 mg/dL — ABNORMAL HIGH (ref 70–99)
Potassium: 4.3 mmol/L (ref 3.5–5.2)
Sodium: 139 mmol/L (ref 134–144)
Total Protein: 5.8 g/dL — ABNORMAL LOW (ref 6.0–8.5)
eGFR: 89 mL/min/{1.73_m2} (ref >59)

## 2022-06-16 LAB — CBC
Hematocrit: 41.3 % (ref 37.5–51.0)
Hemoglobin: 13.5 g/dL (ref 13.0–17.7)
MCH: 30.1 pg (ref 26.6–33.0)
MCHC: 32.7 g/dL (ref 31.5–35.7)
MCV: 92 fL (ref 79–97)
Platelets: 219 10*3/uL (ref 150–450)
RBC: 4.49 x10E6/uL (ref 4.14–5.80)
RDW: 13.6 % (ref 11.6–15.4)
WBC: 6.8 10*3/uL (ref 3.4–10.8)

## 2022-06-16 LAB — TSH: TSH: 0.849 u[IU]/mL (ref 0.450–4.500)

## 2022-06-17 ENCOUNTER — Other Ambulatory Visit (HOSPITAL_COMMUNITY): Payer: Self-pay

## 2022-06-23 ENCOUNTER — Encounter (HOSPITAL_COMMUNITY): Payer: Self-pay | Admitting: Gastroenterology

## 2022-06-23 ENCOUNTER — Ambulatory Visit (HOSPITAL_COMMUNITY): Payer: Medicare HMO

## 2022-06-23 ENCOUNTER — Other Ambulatory Visit: Payer: Self-pay

## 2022-06-23 ENCOUNTER — Other Ambulatory Visit (HOSPITAL_COMMUNITY): Payer: Self-pay | Admitting: Physician Assistant

## 2022-06-23 ENCOUNTER — Ambulatory Visit (HOSPITAL_COMMUNITY)
Admission: RE | Admit: 2022-06-23 | Discharge: 2022-06-23 | Disposition: A | Discharge status: Home / Self Care | Payer: Medicare HMO | Source: Ambulatory Visit | Attending: Gastroenterology | Admitting: Gastroenterology

## 2022-06-23 ENCOUNTER — Encounter (HOSPITAL_COMMUNITY)
Admission: RE | Disposition: A | Discharge status: Home / Self Care | Payer: Self-pay | Source: Ambulatory Visit | Attending: Gastroenterology

## 2022-06-23 ENCOUNTER — Ambulatory Visit (HOSPITAL_BASED_OUTPATIENT_CLINIC_OR_DEPARTMENT_OTHER): Payer: Medicare HMO | Admitting: Certified Registered Nurse Anesthetist

## 2022-06-23 ENCOUNTER — Ambulatory Visit (HOSPITAL_COMMUNITY): Payer: Medicare HMO | Admitting: Certified Registered Nurse Anesthetist

## 2022-06-23 DIAGNOSIS — Z538 Procedure and treatment not carried out for other reasons: Secondary | ICD-10-CM | POA: Insufficient documentation

## 2022-06-23 DIAGNOSIS — K219 Gastro-esophageal reflux disease without esophagitis: Secondary | ICD-10-CM | POA: Insufficient documentation

## 2022-06-23 DIAGNOSIS — K831 Obstruction of bile duct: Secondary | ICD-10-CM

## 2022-06-23 DIAGNOSIS — K8050 Calculus of bile duct without cholangitis or cholecystitis without obstruction: Secondary | ICD-10-CM | POA: Insufficient documentation

## 2022-06-23 DIAGNOSIS — Z87891 Personal history of nicotine dependence: Secondary | ICD-10-CM

## 2022-06-23 DIAGNOSIS — K3189 Other diseases of stomach and duodenum: Secondary | ICD-10-CM

## 2022-06-23 DIAGNOSIS — Z48815 Encounter for surgical aftercare following surgery on the digestive system: Secondary | ICD-10-CM | POA: Diagnosis not present

## 2022-06-23 DIAGNOSIS — J449 Chronic obstructive pulmonary disease, unspecified: Secondary | ICD-10-CM | POA: Insufficient documentation

## 2022-06-23 DIAGNOSIS — T85590A Other mechanical complication of bile duct prosthesis, initial encounter: Secondary | ICD-10-CM | POA: Diagnosis not present

## 2022-06-23 DIAGNOSIS — K8000 Calculus of gallbladder with acute cholecystitis without obstruction: Secondary | ICD-10-CM

## 2022-06-23 DIAGNOSIS — Z79899 Other long term (current) drug therapy: Secondary | ICD-10-CM | POA: Diagnosis not present

## 2022-06-23 DIAGNOSIS — Z7984 Long term (current) use of oral hypoglycemic drugs: Secondary | ICD-10-CM | POA: Insufficient documentation

## 2022-06-23 DIAGNOSIS — I10 Essential (primary) hypertension: Secondary | ICD-10-CM

## 2022-06-23 DIAGNOSIS — K5710 Diverticulosis of small intestine without perforation or abscess without bleeding: Secondary | ICD-10-CM | POA: Diagnosis not present

## 2022-06-23 DIAGNOSIS — E119 Type 2 diabetes mellitus without complications: Secondary | ICD-10-CM | POA: Insufficient documentation

## 2022-06-23 DIAGNOSIS — N281 Cyst of kidney, acquired: Secondary | ICD-10-CM | POA: Diagnosis not present

## 2022-06-23 DIAGNOSIS — Z4682 Encounter for fitting and adjustment of non-vascular catheter: Secondary | ICD-10-CM

## 2022-06-23 HISTORY — PX: HEMOSTASIS CLIP PLACEMENT: SHX6857

## 2022-06-23 HISTORY — DX: Presence of external hearing-aid: Z97.4

## 2022-06-23 HISTORY — PX: ESOPHAGOGASTRODUODENOSCOPY: SHX5428

## 2022-06-23 HISTORY — DX: Presence of dental prosthetic device (complete) (partial): Z97.2

## 2022-06-23 HISTORY — DX: Presence of spectacles and contact lenses: Z97.3

## 2022-06-23 HISTORY — DX: Other complications of anesthesia, initial encounter: T88.59XA

## 2022-06-23 HISTORY — PX: ESOPHAGEAL DILATION: SHX303

## 2022-06-23 HISTORY — PX: IR EXCHANGE BILIARY DRAIN: IMG6046

## 2022-06-23 LAB — GLUCOSE, CAPILLARY: Glucose-Capillary: 97 mg/dL (ref 70–99)

## 2022-06-23 SURGERY — DILATION, ESOPHAGUS
Anesthesia: General

## 2022-06-23 MED ORDER — PROPOFOL 10 MG/ML IV BOLUS
INTRAVENOUS | Status: DC | PRN
  Administered 2022-06-23: 150 mg via INTRAVENOUS

## 2022-06-23 MED ORDER — DICLOFENAC SUPPOSITORY 100 MG
RECTAL | Status: AC
  Filled 2022-06-23: qty 1

## 2022-06-23 MED ORDER — CIPROFLOXACIN IN D5W 400 MG/200ML IV SOLN
INTRAVENOUS | Status: DC | PRN
  Administered 2022-06-23: 400 mg via INTRAVENOUS

## 2022-06-23 MED ORDER — GLUCAGON HCL RDNA (DIAGNOSTIC) 1 MG IJ SOLR
INTRAMUSCULAR | Status: AC
  Filled 2022-06-23: qty 2

## 2022-06-23 MED ORDER — LIDOCAINE HCL 1 % IJ SOLN
INTRAMUSCULAR | Status: AC
  Filled 2022-06-23: qty 20

## 2022-06-23 MED ORDER — LACTATED RINGERS IV SOLN: INTRAVENOUS | Status: DC | PRN

## 2022-06-23 MED ORDER — IOHEXOL 300 MG/ML  SOLN
50.0000 mL | Freq: Once | INTRAMUSCULAR | Status: AC | PRN
  Administered 2022-06-23: 20 mL

## 2022-06-23 MED ORDER — CIPROFLOXACIN IN D5W 400 MG/200ML IV SOLN
INTRAVENOUS | Status: AC
  Filled 2022-06-23: qty 200

## 2022-06-23 MED ORDER — SUGAMMADEX SODIUM 200 MG/2ML IV SOLN
INTRAVENOUS | Status: DC | PRN
  Administered 2022-06-23: 200 mg via INTRAVENOUS

## 2022-06-23 MED ORDER — ONDANSETRON HCL 4 MG/2ML IJ SOLN
INTRAMUSCULAR | Status: DC | PRN
  Administered 2022-06-23: 4 mg via INTRAVENOUS

## 2022-06-23 MED ORDER — LACTATED RINGERS IV SOLN
INTRAVENOUS | Status: DC
  Administered 2022-06-23: 1000 mL via INTRAVENOUS

## 2022-06-23 MED ORDER — LIDOCAINE HCL (CARDIAC) PF 100 MG/5ML IV SOSY
PREFILLED_SYRINGE | INTRAVENOUS | Status: DC | PRN
  Administered 2022-06-23: 80 mg via INTRAVENOUS

## 2022-06-23 MED ORDER — ROCURONIUM BROMIDE 100 MG/10ML IV SOLN
INTRAVENOUS | Status: DC | PRN
  Administered 2022-06-23: 50 mg via INTRAVENOUS
  Administered 2022-06-23: 20 mg via INTRAVENOUS

## 2022-06-23 NOTE — Progress Notes (Signed)
Douglas Hurst. 12:22 PM  Subjective: Patient seen and examined and has been doing okay since recently seen in the office without any new complaints and his case extensively discussed with radiology today and the surgeon recently Objective: Vital signs stable afebrile no acute distress exam please see preassessment evaluation IR report and CT reviewed recent labs reviewed  Assessment: CBD stone  Plan: Okay to proceed with ERCP with anesthesia assistance and that procedure was rediscussed with the patient and his wife  [facility]  office [phone] After 5PM or if no answer call [phone]

## 2022-06-24 ENCOUNTER — Encounter (HOSPITAL_COMMUNITY): Payer: Self-pay | Admitting: Gastroenterology

## 2022-06-25 ENCOUNTER — Telehealth (HOSPITAL_BASED_OUTPATIENT_CLINIC_OR_DEPARTMENT_OTHER): Payer: Self-pay | Admitting: Family

## 2022-06-25 ENCOUNTER — Telehealth (HOSPITAL_COMMUNITY): Payer: Self-pay | Admitting: Interventional Radiology

## 2022-06-25 NOTE — Telephone Encounter (Signed)
Sent in refills 05/29/22. He should still be taking both and just needs to pick up at the pharmacy.   Loel Dubonnet, NP

## 2022-06-25 NOTE — Telephone Encounter (Signed)
Interventional Radiology Telephone Note  I returned a call to Mr. Ginsberg wife this morning. It seems that last night at 11 pm his cholecystostomy tube fell out.   At his most recent visit, it was determined that his gallbladder was completely contracted around the tube with essentially zero luminal space.  He has no output from the drain and all attempted flushes reflux along the tract to the skin surface.   His wife reports that the site is dry and there is no leakage of bile. I believe that the tube was doing nothing for him physiologically given these details.  Attempting to replace the tube is unnecessary at this time.  He will be going for elective cholecystectomy in the near future.  If he should develop recurrent sx of cholecystitis prior to surgery, we could consider placing a new cholecystostomy tube at that time.  I would also recommend trying early to pass a wire through the biliary system and into the duodenum for GI to follow to allow for ERCP before his GB becomes contracted again.   I explained all of this in detail to his wife.  She voiced her understanding.  He has a follow-up with Dr. Watt Climes on Friday.    Signed,  Criselda Peaches, MD

## 2022-06-25 NOTE — Telephone Encounter (Signed)
Please advise 

## 2022-06-25 NOTE — Telephone Encounter (Signed)
RN returned call to patient and provided the following recommendation. Patient verbalizes understanding.    "Sent in refills 05/29/22. He should still be taking both and just needs to pick up at the pharmacy.    Loel Dubonnet, NP "

## 2022-06-25 NOTE — Telephone Encounter (Signed)
Pt c/o medication issue:  1. Name of Medication:  amiodarone (PACERONE) 200 MG tablet metoprolol tartrate (LOPRESSOR) 25 MG tablet  2. How are you currently taking this medication (dosage and times per day)? As directed  3. Are you having a reaction (difficulty breathing--STAT)? no  4. What is your medication issue? Wife of patient was looking at bottles the patient got from the hospital pharmacy and the dosage instructions only say to take for 30 days. She was not sure if the patient still needs to take these medications or not.

## 2022-06-26 ENCOUNTER — Other Ambulatory Visit (HOSPITAL_COMMUNITY): Payer: Medicare HMO

## 2022-06-26 NOTE — Anesthesia Postprocedure Evaluation (Signed)
Anesthesia Post Note  Patient: Douglas Hurst.  Procedure(s) Performed: PYLORIC  DILATION HEMOSTASIS CLIP PLACEMENT ESOPHAGOGASTRODUODENOSCOPY (EGD)     Patient location during evaluation: Endoscopy Anesthesia Type: General Level of consciousness: awake Pain management: pain level controlled Vital Signs Assessment: post-procedure vital signs reviewed and stable Respiratory status: spontaneous breathing Cardiovascular status: stable Postop Assessment: no apparent nausea or vomiting Anesthetic complications: no   No notable events documented.  Last Vitals:  Vitals:   06/23/22 1535 06/23/22 1540  BP:  (!) 142/83  Pulse: 66 68  Resp: 18 (!) 25  Temp:    SpO2: 97% 97%    Last Pain:  Vitals:   06/23/22 1458  TempSrc:   PainSc: 5                  John F Salome Arnt

## 2022-06-29 ENCOUNTER — Other Ambulatory Visit: Payer: Self-pay | Admitting: Gastroenterology

## 2022-07-01 ENCOUNTER — Ambulatory Visit: Payer: Self-pay | Admitting: Surgery

## 2022-07-01 NOTE — Progress Notes (Addendum)
PCP - Lavone Orn, MD Cardiologist -  Skeet Latch clearance Denyse Amass cleaver,NP 06-12-22 epic  PPM/ICD -  Device Orders -  Rep Notified -   Chest x-ray -  EKG - 04-28-22 epic Stress Test -  ECHO - 05-12-22 epic Cardiac Cath -  Cbc,cmp 06-15-22 epic  Sleep Study -  CPAP -   Fasting Blood Sugar -  Checks Blood Sugar __0___ times a day  Blood Thinner Instructions:Elequis hold 2 days Aspirin Instructions:  ERAS Protcol - PRE-SURGERY   COVID vaccine -x5  Pfizer  Activity--No SOB with ADL's uses walker outside  Anesthesia review: HTN, Afib, COPD  Patient denies shortness of breath, fever, cough and chest pain at PAT appointment   All instructions explained to the patient, with a verbal understanding of the material. Patient agrees to go over the instructions while at home for a better understanding. Patient also instructed to self quarantine after being tested for COVID-19. The opportunity to ask questions was provided.

## 2022-07-01 NOTE — Patient Instructions (Addendum)
DUE TO COVID-19 ONLY TWO VISITORS  (aged 80 and older)  ARE ALLOWED TO COME WITH YOU AND STAY IN THE WAITING ROOM ONLY DURING PRE OP AND PROCEDURE.   **NO VISITORS ARE ALLOWED IN THE SHORT STAY AREA OR RECOVERY ROOM!!**  IF YOU WILL BE ADMITTED INTO THE HOSPITAL YOU ARE ALLOWED ONLY FOUR SUPPORT PEOPLE DURING VISITATION HOURS ONLY (7 AM -8PM)   The support person(s) must pass our screening, gel in and out, and wear a mask at all times, including in the patient's room. Patients must also wear a mask when staff or their support person are in the room. Visitors GUEST BADGE MUST BE WORN VISIBLY  One adult visitor may remain with you overnight and MUST be in the room by 8 P.M.     Your procedure is scheduled on: 07-08-22   Report to [facility] Main Entrance    Report to admitting at      1:30 PM   Call this number if you have problems the morning of surgery [phone]   Do not eat food :After Midnight.   After Midnight you may have the following liquids until __1230____  PM DAY OF SURGERY  then nothing by mouth  Water Black Coffee (sugar ok, NO MILK/CREAM OR CREAMERS)  Tea (sugar ok, NO MILK/CREAM OR CREAMERS) regular and decaf                             Plain Jell-O (NO RED)                                           Fruit ices (not with fruit pulp, NO RED)                                     Popsicles (NO RED)                                                                  Juice: apple, WHITE grape, WHITE cranberry Sports drinks like Gatorade (NO RED) Clear broth(vegetable,chicken,beef)                  FOLLOW  ANY ADDITIONAL PRE OP INSTRUCTIONS YOU RECEIVED FROM YOUR SURGEON'S OFFICE!!!     Oral Hygiene is also important to reduce your risk of infection.                                    Remember - BRUSH YOUR TEETH THE MORNING OF SURGERY WITH YOUR REGULAR TOOTHPASTE   Do NOT smoke after Midnight   Take these medicines the morning of surgery with A SIP OF WATER:  inhalers and bring them with you, pantoprazole, amiodarone, metoprolol  DO NOT TAKE ANY ORAL DIABETIC MEDICATIONS DAY OF YOUR SURGERY  Bring CPAP mask and tubing day of surgery.  You may not have any metal on your body including hair pins, jewelry, and body piercing             Do not wear  lotions, powders, perfumes/cologne, or deodorant                Men may shave face and neck.   Do not bring valuables to the hospital. [place].   Contacts, dentures or bridgework may not be worn into surgery.   Bring small overnight bag day of surgery.   DO NOT [place]. PHARMACY WILL DISPENSE MEDICATIONS LISTED ON YOUR MEDICATION LIST TO YOU DURING YOUR ADMISSION [place]!    Patients discharged on the day of surgery will not be allowed to drive home.  Someone NEEDS to stay with you for the first 24 hours after anesthesia.             Please read over the following fact sheets you were given: IF YOU HAVE QUESTIONS ABOUT YOUR PRE-OP INSTRUCTIONS PLEASE CALL [phone]     [facility] - Preparing for Surgery Before surgery, you can play an important role.  Because skin is not sterile, your skin needs to be as free of germs as possible.  You can reduce the number of germs on your skin by washing with CHG (chlorahexidine gluconate) soap before surgery.  CHG is an antiseptic cleaner which kills germs and bonds with the skin to continue killing germs even after washing. Please DO NOT use if you have an allergy to CHG or antibacterial soaps.  If your skin becomes reddened/irritated stop using the CHG and inform your nurse when you arrive at Short Stay. Do not shave (including legs and underarms) for at least 48 hours prior to the first CHG shower.  You may shave your face/neck. Please follow these instructions carefully:  1.  Shower with CHG Soap the night before surgery and the   morning of Surgery.  2.  If you choose to wash your hair, wash your hair first as usual with your  normal  shampoo.  3.  After you shampoo, rinse your hair and body thoroughly to remove the  shampoo.                           4.  Use CHG as you would any other liquid soap.  You can apply chg directly  to the skin and wash                       Gently with a scrungie or clean washcloth.  5.  Apply the CHG Soap to your body ONLY FROM THE NECK DOWN.   Do not use on face/ open                           Wound or open sores. Avoid contact with eyes, ears mouth and genitals (private parts).                       Wash face,  Genitals (private parts) with your normal soap.             6.  Wash thoroughly, paying special attention to the area where your surgery  will be performed.  7.  Thoroughly rinse your body with warm water from the neck down.  8.  DO NOT shower/wash with your normal soap after using and rinsing off  the CHG Soap.                9.  Pat yourself dry with a clean towel.            10.  Wear clean pajamas.            11.  Place clean sheets on your bed the night of your first shower and do not  sleep with pets. Day of Surgery : Do not apply any lotions/deodorants the morning of surgery.  Please wear clean clothes to the hospital/surgery center.  FAILURE TO FOLLOW THESE INSTRUCTIONS MAY RESULT IN THE CANCELLATION OF YOUR SURGERY PATIENT SIGNATURE_________________________________  NURSE SIGNATURE__________________________________  ________________________________________________________________________

## 2022-07-01 NOTE — Progress Notes (Signed)
Please place orders in epic for preop

## 2022-07-02 ENCOUNTER — Telehealth (HOSPITAL_BASED_OUTPATIENT_CLINIC_OR_DEPARTMENT_OTHER): Payer: Self-pay

## 2022-07-02 NOTE — Telephone Encounter (Signed)
Left message for the pt to call ASAP in the morning as he is going to need to be added onto the pre op scheduled for tomorrow. Pt's surgery is set for 7/12. Pt will need to be advised in timely manner to hold his blood thinner.

## 2022-07-02 NOTE — Telephone Encounter (Signed)
   Name: Douglas Hurst.  DOB: 1941-07-08  MRN: [id number]  Primary Cardiologist: Skeet Latch, MD   Preoperative team, please contact this patient and set up a phone call appointment for further preoperative risk assessment. Please obtain consent and complete medication review. Thank you for your help.  I confirm that guidance regarding antiplatelet and oral anticoagulation therapy has been completed and, if necessary, noted below.  Patient with diagnosis of afib on Eliquis for anticoagulation.     Procedure: Cholecystectomy Date of procedure: 07/08/22     CHA2DS2-VASc Score = 5   This indicates a 7.2% annual risk of stroke. The patient's score is based upon: CHF History: 1 HTN History: 1 Diabetes History: 1 Stroke History: 0 Vascular Disease History: 0 Age Score: 2 Gender Score: 0       CrCl 83 ml/min   Per office protocol, patient can hold Eliquis for 2 days prior to procedure.    Lenna Sciara, NP 07/02/2022, 4:58 PM [place] [address] [place], [place] [address]

## 2022-07-02 NOTE — Telephone Encounter (Signed)
Patient with diagnosis of afib on Eliquis for anticoagulation.    Procedure: Cholecystectomy Date of procedure: 07/08/22   CHA2DS2-VASc Score = 5   This indicates a 7.2% annual risk of stroke. The patient's score is based upon: CHF History: 1 HTN History: 1 Diabetes History: 1 Stroke History: 0 Vascular Disease History: 0 Age Score: 2 Gender Score: 0      CrCl 83 ml/min  Per office protocol, patient can hold Eliquis for 2 days prior to procedure.    **This guidance is not considered finalized until pre-operative APP has relayed final recommendations.**

## 2022-07-02 NOTE — Telephone Encounter (Signed)
   Pre-operative Risk Assessment    Patient Name: Douglas Hurst.  DOB: 1941-06-13 MRN: [id number]      Request for Surgical Clearance    Procedure:   Cholecystectomy  Date of Surgery:  Clearance 07/08/22                                 Surgeon:  Louanna Raw, MD Surgeon's Group or Practice Name:  [facility] Phone number:  [phone] Fax number:  [phone]   Type of Clearance Requested:   - Medical  - Pharmacy:  Hold Apixaban (Eliquis) (how long?)   Type of Anesthesia:  General    Additional requests/questions:  Please advise surgeon/provider what medications should be held.  Douglas Hurst   07/02/2022, 2:12 PM

## 2022-07-03 ENCOUNTER — Ambulatory Visit (INDEPENDENT_AMBULATORY_CARE_PROVIDER_SITE_OTHER): Payer: Medicare HMO | Admitting: Nurse Practitioner

## 2022-07-03 ENCOUNTER — Telehealth: Payer: Self-pay | Admitting: *Deleted

## 2022-07-03 ENCOUNTER — Encounter (HOSPITAL_COMMUNITY)
Admission: RE | Admit: 2022-07-03 | Discharge: 2022-07-03 | Disposition: A | Discharge status: Home / Self Care | Payer: Medicare HMO | Source: Ambulatory Visit | Attending: Surgery | Admitting: Surgery

## 2022-07-03 ENCOUNTER — Encounter (HOSPITAL_COMMUNITY): Payer: Self-pay

## 2022-07-03 ENCOUNTER — Other Ambulatory Visit: Payer: Self-pay

## 2022-07-03 VITALS — BP 131/59 | HR 58 | Temp 97.5°F | Resp 18 | Ht 73.0 in | Wt 182.0 lb

## 2022-07-03 DIAGNOSIS — Z01818 Encounter for other preprocedural examination: Secondary | ICD-10-CM | POA: Insufficient documentation

## 2022-07-03 DIAGNOSIS — K8040 Calculus of bile duct with cholecystitis, unspecified, without obstruction: Secondary | ICD-10-CM | POA: Insufficient documentation

## 2022-07-03 DIAGNOSIS — I110 Hypertensive heart disease with heart failure: Secondary | ICD-10-CM | POA: Diagnosis not present

## 2022-07-03 DIAGNOSIS — I10 Essential (primary) hypertension: Secondary | ICD-10-CM

## 2022-07-03 DIAGNOSIS — E119 Type 2 diabetes mellitus without complications: Secondary | ICD-10-CM | POA: Insufficient documentation

## 2022-07-03 DIAGNOSIS — Z01810 Encounter for preprocedural cardiovascular examination: Secondary | ICD-10-CM | POA: Diagnosis not present

## 2022-07-03 DIAGNOSIS — I5032 Chronic diastolic (congestive) heart failure: Secondary | ICD-10-CM | POA: Insufficient documentation

## 2022-07-03 DIAGNOSIS — I714 Abdominal aortic aneurysm, without rupture, unspecified: Secondary | ICD-10-CM | POA: Insufficient documentation

## 2022-07-03 DIAGNOSIS — R001 Bradycardia, unspecified: Secondary | ICD-10-CM | POA: Insufficient documentation

## 2022-07-03 DIAGNOSIS — I4891 Unspecified atrial fibrillation: Secondary | ICD-10-CM | POA: Insufficient documentation

## 2022-07-03 DIAGNOSIS — E782 Mixed hyperlipidemia: Secondary | ICD-10-CM

## 2022-07-03 DIAGNOSIS — J449 Chronic obstructive pulmonary disease, unspecified: Secondary | ICD-10-CM | POA: Insufficient documentation

## 2022-07-03 HISTORY — DX: Gastro-esophageal reflux disease without esophagitis: K21.9

## 2022-07-03 HISTORY — DX: Cardiac arrhythmia, unspecified: I49.9

## 2022-07-03 HISTORY — DX: Type 2 diabetes mellitus without complications: E11.9

## 2022-07-03 HISTORY — DX: Calculus of gallbladder without cholecystitis without obstruction: K80.20

## 2022-07-03 LAB — GLUCOSE, CAPILLARY: Glucose-Capillary: 98 mg/dL (ref 70–99)

## 2022-07-03 LAB — HEMOGLOBIN A1C
Hgb A1c MFr Bld: 5.6 % (ref 4.8–5.6)
Mean Plasma Glucose: 114.02 mg/dL

## 2022-07-03 NOTE — Telephone Encounter (Signed)
Pt agreeable to add on tele visit for today as his procedure is set for 7/12 and we need to allow time for holding his blood thinner.   Med rec and consent are done.      Patient Consent for Virtual Visit        Douglas Hurst. has provided verbal consent on 07/03/2022 for a virtual visit (video or telephone).   CONSENT FOR VIRTUAL VISIT FOR:  Douglas Hurst.  By participating in this virtual visit I agree to the following:  I hereby voluntarily request, consent and authorize [place] and its employed or contracted physicians, physician assistants, nurse practitioners or other licensed health care professionals (the Practitioner), to provide me with telemedicine health care services (the "Services") as deemed necessary by the treating Practitioner. I acknowledge and consent to receive the Services by the Practitioner via telemedicine. I understand that the telemedicine visit will involve communicating with the Practitioner through live audiovisual communication technology and the disclosure of certain medical information by electronic transmission. I acknowledge that I have been given the opportunity to request an in-person assessment or other available alternative prior to the telemedicine visit and am voluntarily participating in the telemedicine visit.  I understand that I have the right to withhold or withdraw my consent to the use of telemedicine in the course of my care at any time, without affecting my right to future care or treatment, and that the Practitioner or I may terminate the telemedicine visit at any time. I understand that I have the right to inspect all information obtained and/or recorded in the course of the telemedicine visit and may receive copies of available information for a reasonable fee.  I understand that some of the potential risks of receiving the Services via telemedicine include:  Delay or interruption in medical evaluation due to technological  equipment failure or disruption; Information transmitted may not be sufficient (e.g. poor resolution of images) to allow for appropriate medical decision making by the Practitioner; and/or  In rare instances, security protocols could fail, causing a breach of personal health information.  Furthermore, I acknowledge that it is my responsibility to provide information about my medical history, conditions and care that is complete and accurate to the best of my ability. I acknowledge that Practitioner's advice, recommendations, and/or decision may be based on factors not within their control, such as incomplete or inaccurate data provided by me or distortions of diagnostic images or specimens that may result from electronic transmissions. I understand that the practice of medicine is not an exact science and that Practitioner makes no warranties or guarantees regarding treatment outcomes. I acknowledge that a copy of this consent can be made available to me via my patient portal ([place]), or I can request a printed copy by calling the office of [facility].    I understand that my insurance will be billed for this visit.   I have read or had this consent read to me. I understand the contents of this consent, which adequately explains the benefits and risks of the Services being provided via telemedicine.  I have been provided ample opportunity to ask questions regarding this consent and the Services and have had my questions answered to my satisfaction. I give my informed consent for the services to be provided through the use of telemedicine in my medical care

## 2022-07-03 NOTE — Telephone Encounter (Signed)
Pt agreeable to add on tele visit for today as his procedure is set for 7/12 and we need to allow time for holding his blood thinner.   Med rec and consent are done.

## 2022-07-03 NOTE — Progress Notes (Signed)
Virtual Visit via Telephone Note   Because of [name]'s co-morbid illnesses, he is at least at moderate risk for complications without adequate follow up.  This format is felt to be most appropriate for this patient at this time.  The patient did not have access to video technology/had technical difficulties with video requiring transitioning to audio format only (telephone).  All issues noted in this document were discussed and addressed.  No physical exam could be performed with this format.  Please refer to the patient's chart for his consent to telehealth for [facility].  Evaluation Performed:  Preoperative cardiovascular risk assessment _____________   Date:  07/03/2022   Patient ID:  Douglas Hurst., DOB 1941-05-23, MRN [id number] Patient Location:  Home Provider location:   Office  Primary Care Provider:  Lavone Orn, MD Primary Cardiologist:  Skeet Latch, MD  Chief Complaint / Patient Profile   81 y.o. y/o male with a h/o dismal atrial fibrillation, AAA, aortic dilation, and chronic diastolic heart failure who is pending cholecystectomy on 07/08/2022 with Dr. Louanna Raw of [facility] and presents today for telephonic preoperative cardiovascular risk assessment.  Past Medical History    Past Medical History:  Diagnosis Date   Bipolar disorder ([place])    Chronic back pain    Complication of anesthesia    prolonged sedation and confusion   COPD (chronic obstructive pulmonary disease) (HCC)    Dr. Laurann Montana   Diabetes mellitus without complication ([place])    type 2   Diverticulosis    Dysrhythmia    Afib  onset 03-2022   ED (erectile dysfunction)    Gallstones    GERD (gastroesophageal reflux disease)    Hiatal hernia    Hyperlipidemia    Hypertension    Wears dentures    upper   Wears glasses    Wears hearing aid in both ears    Past Surgical History:  Procedure Laterality Date   BALLOON DILATION N/A 10/01/2015    Procedure: BALLOON DILATION;  Surgeon: Garlan Fair, MD;  Location: [facility];  Service: Endoscopy;  Laterality: N/A;   CATARACT EXTRACTION, BILATERAL     COLONOSCOPY     removed polyps   COLONOSCOPY WITH PROPOFOL N/A 09/01/2016   Procedure: COLONOSCOPY WITH PROPOFOL;  Surgeon: Garlan Fair, MD;  Location: [facility];  Service: Endoscopy;  Laterality: N/A;   ESOPHAGEAL DILATION  06/23/2022   Procedure: PYLORIC  DILATION;  Surgeon: Clarene Essex, MD;  Location: [facility];  Service: Gastroenterology;;   ESOPHAGOGASTRODUODENOSCOPY N/A 06/23/2022   Procedure: ESOPHAGOGASTRODUODENOSCOPY (EGD);  Surgeon: Clarene Essex, MD;  Location: [facility];  Service: Gastroenterology;  Laterality: N/A;   ESOPHAGOGASTRODUODENOSCOPY (EGD) WITH PROPOFOL N/A 10/01/2015   Procedure: ESOPHAGOGASTRODUODENOSCOPY (EGD) WITH PROPOFOL;  Surgeon: Garlan Fair, MD;  Location: [facility];  Service: Endoscopy;  Laterality: N/A;   HEMOSTASIS CLIP PLACEMENT  06/23/2022   Procedure: HEMOSTASIS CLIP PLACEMENT;  Surgeon: Clarene Essex, MD;  Location: [facility];  Service: Gastroenterology;;   IR EXCHANGE BILIARY DRAIN  05/15/2022   IR EXCHANGE BILIARY DRAIN  05/26/2022   IR EXCHANGE BILIARY DRAIN  06/11/2022   IR EXCHANGE BILIARY DRAIN  06/23/2022   IR PERC CHOLECYSTOSTOMY  04/26/2022   KNEE ARTHROSCOPY     TONSILLECTOMY      Allergies  No Known Allergies  History of Present Illness    Douglas Buntrock. is a 81 y.o. male who presents via audio/video conferencing for a telehealth visit today.  Pt was last seen in cardiology clinic on 05/29/2022 by Laurann Montana, NP.  At that time Douglas Swider. was doing well. The patient is now pending procedure as outlined above. Since his last visit, he is stable from a cardiac standpoint.  He does have some generalized weakness in the setting of acute cholecystitis. He denies chest pain, palpitations, dyspnea, pnd, orthopnea, n, v, dizziness, syncope, edema, weight gain, or  early satiety. All other systems reviewed and are otherwise negative except as noted above.   Home Medications    Prior to Admission medications   Medication Sig Start Date End Date Taking? Authorizing Provider  acetaminophen (TYLENOL) 325 MG tablet Take 2 tablets (650 mg total) by mouth every 6 (six) hours as needed for mild pain (or Fever >/= 101). 04/30/22   Douglas Noble Latif, DO  amiodarone (PACERONE) 200 MG tablet Take 1 tablet (200 mg total) by mouth daily. 05/29/22   Loel Dubonnet, NP  apixaban (ELIQUIS) 5 MG TABS tablet Take 1 tablet (5 mg total) by mouth 2 (two) times daily. 05/29/22   Loel Dubonnet, NP  atorvastatin (LIPITOR) 40 MG tablet Take 40 mg by mouth every evening.    [provider]  metFORMIN (GLUCOPHAGE-XR) 500 MG 24 hr tablet Take 500-1,000 mg by mouth See admin instructions. Take 2 tablets (1000 mg) in the morning and 1 tablet (500 mg) at night 03/17/22   [provider]  metoprolol tartrate (LOPRESSOR) 25 MG tablet Take 1 tablet (25 mg total) by mouth 2 (two) times daily. 05/29/22   Loel Dubonnet, NP  Multiple Vitamin (MULTIVITAMIN) tablet Take 1 tablet by mouth daily.    [provider]  ondansetron (ZOFRAN) 4 MG tablet Take 1 tablet (4 mg total) by mouth every 6 (six) hours as needed for nausea. 04/30/22   Sheikh, Omair Latif, DO  pantoprazole (PROTONIX) 40 MG tablet Take 40 mg by mouth every morning.    [provider]  polyethylene glycol powder (GLYCOLAX/MIRALAX) 17 GM/SCOOP powder Take 17 g by mouth daily. Patient taking differently: Take 17 g by mouth daily as needed for mild constipation or moderate constipation. 04/30/22   Sheikh, Omair Latif, DO  senna-docusate (SENOKOT-S) 8.6-50 MG tablet Take 1 tablet by mouth at bedtime. Patient taking differently: Take 1 tablet by mouth at bedtime as needed for mild constipation or moderate constipation. 04/30/22   Sheikh, Omair Latif, DO  SPIRIVA RESPIMAT 2.5 MCG/ACT AERS Inhale 2 puffs into  the lungs daily as needed (COPD). 06/04/22   [provider]  tadalafil (CIALIS) 20 MG tablet Take 20 mg by mouth every three (3) days as needed for erectile dysfunction. 03/29/22   [provider]  tamsulosin (FLOMAX) 0.4 MG CAPS capsule Take 0.4 mg by mouth daily after supper. 05/13/22   [provider]    Physical Exam    Vital Signs:  Douglas Hurst. does not have vital signs available for review today.  Given telephonic nature of communication, physical exam is limited. AAOx3. NAD. Normal affect.  Speech and respirations are unlabored.  Accessory Clinical Findings    None  Assessment & Plan    1.  Preoperative Cardiovascular Risk Assessment:  According to the Revised Cardiac Risk Index (RCRI), his Perioperative Risk of Major Cardiac Event is (%): 0.9. His Functional Capacity in METs is: 4.4 according to the Duke Activity Status Index (DASI). Therefore, based on ACC/AHA guidelines, patient would be at acceptable risk for the planned procedure without  further cardiovascular testing.   Patient with diagnosis of afib on Eliquis for anticoagulation.     Procedure: Cholecystectomy Date of procedure: 07/08/22     CHA2DS2-VASc Score = 5   This indicates a 7.2% annual risk of stroke. The patient's score is based upon: CHF History: 1 HTN History: 1 Diabetes History: 1 Stroke History: 0 Vascular Disease History: 0 Age Score: 2 Gender Score: 0       CrCl 83 ml/min   Per office protocol, patient can hold Eliquis for 2 days prior to procedure. Please resume Eliquis as soon as possible postprocedure, at the discretion of the surgeon.     A copy of this note will be routed to requesting surgeon.  Time:   Today, I have spent 8 minutes with the patient with telehealth technology discussing medical history, symptoms, and management plan.     Lenna Sciara, NP  07/03/2022, 11:54 AM

## 2022-07-06 NOTE — Progress Notes (Signed)
Anesthesia Chart Review   Case: [id number] Date/Time: 07/08/22 1515   Procedure: LAPAROSCOPIC CHOLECYSTECTOMY WITH INTRAOPERATIVE CHOLANGIOGRAM AND LAPAROSCOPIC COMMON DUCT EXPLORATION   Anesthesia type: General   Pre-op diagnosis: CHOLECYSTITIS, CHOLEDOCHOLITHIASIS   Location: [facility] ROOM 02 / [facility]   Surgeons: Stechschulte, Nickola Major, MD       DISCUSSION:80 y.o. former smoker with h/o HTN, COPD, DM II, a-fib, AAA (stable 4cm on CT 06/23/2022), chronic diastolic heart failure, cholecystitis scheduled for above procedure 07/08/2022 with Dr. Louanna Raw.   Pt seen by cardiology 07/03/2022 for preoperative evaluation.  Per OV note, "According to the Revised Cardiac Risk Index (RCRI), his Perioperative Risk of Major Cardiac Event is (%): 0.9. His Functional Capacity in METs is: 4.4 according to the Duke Activity Status Index (DASI). Therefore, based on ACC/AHA guidelines, patient would be at acceptable risk for the planned procedure without further cardiovascular testing."    Pt advised to hold Eliquis 2 days prior to procedure.   Anticipate pt can proceed with planned procedure barring acute status change.   VS: BP (!) 131/59   Pulse (!) 58   Temp (!) 36.4 C (Oral)   Resp 18   Ht '6\' 1"'$  (1.854 m)   Wt 82.6 kg   SpO2 99%   BMI 24.01 kg/m   PROVIDERS: Lavone Orn, MD is PCP   Primary Cardiologist:  Skeet Latch, MD LABS: Labs reviewed: Acceptable for surgery. (all labs ordered are listed, but only abnormal results are displayed)  Labs Reviewed  HEMOGLOBIN A1C  GLUCOSE, CAPILLARY     IMAGES:   EKG: 07/03/2022 Rate 57 bpm  Sinus bradycardia Otherwise normal ECG When compared with ECG of 28-Apr-2022 01:02, Sinus rhythm has replaced Atrial fibrillation  CV: Echo 05/12/2022 1. Left ventricular ejection fraction, by estimation, is 60 to 65%. The  left ventricle has normal function. The left ventricle has no regional  wall motion abnormalities. Left ventricular diastolic  parameters are  consistent with Grade I diastolic  dysfunction (impaired relaxation).   2. Right ventricular systolic function is normal. The right ventricular  size is normal. There is normal pulmonary artery systolic pressure. The  estimated right ventricular systolic pressure is 45.8 mmHg.   3. Left atrial size was mildly dilated.   4. The mitral valve is abnormal. Trivial mitral valve regurgitation.   5. The aortic valve is tricuspid. Aortic valve regurgitation is trivial.  Aortic valve sclerosis is present, with no evidence of aortic valve  stenosis.   6. Aortic dilatation noted. There is mild dilatation of the ascending  aorta, measuring 40 mm.   7. The inferior vena cava is normal in size with greater than 50%  respiratory variability, suggesting right atrial pressure of 3 mmHg.  Past Medical History:  Diagnosis Date   Bipolar disorder ([place])    Chronic back pain    Complication of anesthesia    prolonged sedation and confusion   COPD (chronic obstructive pulmonary disease) (HCC)    Dr. Laurann Montana   Diabetes mellitus without complication ([place])    type 2   Diverticulosis    Dysrhythmia    Afib  onset 03-2022   ED (erectile dysfunction)    Gallstones    GERD (gastroesophageal reflux disease)    Hiatal hernia    Hyperlipidemia    Hypertension    Wears dentures    upper   Wears glasses    Wears hearing aid in both ears     Past Surgical History:  Procedure Laterality Date  BALLOON DILATION N/A 10/01/2015   Procedure: BALLOON DILATION;  Surgeon: Garlan Fair, MD;  [facility];  Service: Endoscopy;  Laterality: N/A;   CATARACT EXTRACTION, BILATERAL     COLONOSCOPY     removed polyps   COLONOSCOPY WITH PROPOFOL N/A 09/01/2016   Procedure: COLONOSCOPY WITH PROPOFOL;  Surgeon: Garlan Fair, MD;  [facility];  Service: Endoscopy;  Laterality: N/A;   ESOPHAGEAL DILATION  06/23/2022   Procedure: PYLORIC  DILATION;  Surgeon: Clarene Essex, MD;   [facility];  Service: Gastroenterology;;   ESOPHAGOGASTRODUODENOSCOPY N/A 06/23/2022   Procedure: ESOPHAGOGASTRODUODENOSCOPY (EGD);  Surgeon: Clarene Essex, MD;  Location: [facility];  Service: Gastroenterology;  Laterality: N/A;   ESOPHAGOGASTRODUODENOSCOPY (EGD) WITH PROPOFOL N/A 10/01/2015   Procedure: ESOPHAGOGASTRODUODENOSCOPY (EGD) WITH PROPOFOL;  Surgeon: Garlan Fair, MD;  [facility];  Service: Endoscopy;  Laterality: N/A;   HEMOSTASIS CLIP PLACEMENT  06/23/2022   Procedure: HEMOSTASIS CLIP PLACEMENT;  Surgeon: Clarene Essex, MD;  [facility];  Service: Gastroenterology;;   IR EXCHANGE BILIARY DRAIN  05/15/2022   IR EXCHANGE BILIARY DRAIN  05/26/2022   IR EXCHANGE BILIARY DRAIN  06/11/2022   IR EXCHANGE BILIARY DRAIN  06/23/2022   IR PERC CHOLECYSTOSTOMY  04/26/2022   KNEE ARTHROSCOPY     TONSILLECTOMY      MEDICATIONS:  acetaminophen (TYLENOL) 325 MG tablet   amiodarone (PACERONE) 200 MG tablet   apixaban (ELIQUIS) 5 MG TABS tablet   atorvastatin (LIPITOR) 40 MG tablet   metFORMIN (GLUCOPHAGE-XR) 500 MG 24 hr tablet   metoprolol tartrate (LOPRESSOR) 25 MG tablet   Multiple Vitamin (MULTIVITAMIN) tablet   ondansetron (ZOFRAN) 4 MG tablet   pantoprazole (PROTONIX) 40 MG tablet   polyethylene glycol powder (GLYCOLAX/MIRALAX) 17 GM/SCOOP powder   senna-docusate (SENOKOT-S) 8.6-50 MG tablet   SPIRIVA RESPIMAT 2.5 MCG/ACT AERS   tadalafil (CIALIS) 20 MG tablet   tamsulosin (FLOMAX) 0.4 MG CAPS capsule   No current facility-administered medications for this encounter.     Konrad Felix Ward, PA-C [facility] [phone]

## 2022-07-08 ENCOUNTER — Ambulatory Visit (HOSPITAL_COMMUNITY): Payer: Medicare HMO | Admitting: Anesthesiology

## 2022-07-08 ENCOUNTER — Ambulatory Visit (HOSPITAL_COMMUNITY): Payer: Medicare HMO

## 2022-07-08 ENCOUNTER — Encounter (HOSPITAL_COMMUNITY)
Admission: RE | Disposition: A | Discharge status: Skilled Nursing Facility | Payer: Self-pay | Source: Home / Self Care | Attending: Surgery

## 2022-07-08 ENCOUNTER — Other Ambulatory Visit: Payer: Self-pay

## 2022-07-08 ENCOUNTER — Encounter (HOSPITAL_COMMUNITY): Payer: Self-pay | Admitting: Surgery

## 2022-07-08 ENCOUNTER — Inpatient Hospital Stay (HOSPITAL_COMMUNITY)
Admission: RE | Admit: 2022-07-08 | Discharge: 2022-07-14 | DRG: 419 | Disposition: A | Discharge status: Skilled Nursing Facility | Payer: Medicare HMO | Attending: Surgery | Admitting: Surgery

## 2022-07-08 ENCOUNTER — Ambulatory Visit (HOSPITAL_COMMUNITY): Payer: Medicare HMO | Admitting: Physician Assistant

## 2022-07-08 DIAGNOSIS — K8050 Calculus of bile duct without cholangitis or cholecystitis without obstruction: Secondary | ICD-10-CM | POA: Diagnosis present

## 2022-07-08 DIAGNOSIS — M2550 Pain in unspecified joint: Secondary | ICD-10-CM | POA: Diagnosis not present

## 2022-07-08 DIAGNOSIS — Z7984 Long term (current) use of oral hypoglycemic drugs: Secondary | ICD-10-CM

## 2022-07-08 DIAGNOSIS — R410 Disorientation, unspecified: Secondary | ICD-10-CM | POA: Diagnosis present

## 2022-07-08 DIAGNOSIS — K8040 Calculus of bile duct with cholecystitis, unspecified, without obstruction: Secondary | ICD-10-CM | POA: Diagnosis not present

## 2022-07-08 DIAGNOSIS — R339 Retention of urine, unspecified: Secondary | ICD-10-CM | POA: Diagnosis present

## 2022-07-08 DIAGNOSIS — E782 Mixed hyperlipidemia: Secondary | ICD-10-CM | POA: Diagnosis present

## 2022-07-08 DIAGNOSIS — Z7401 Bed confinement status: Secondary | ICD-10-CM | POA: Diagnosis not present

## 2022-07-08 DIAGNOSIS — Z8249 Family history of ischemic heart disease and other diseases of the circulatory system: Secondary | ICD-10-CM

## 2022-07-08 DIAGNOSIS — I4891 Unspecified atrial fibrillation: Secondary | ICD-10-CM | POA: Diagnosis not present

## 2022-07-08 DIAGNOSIS — K8012 Calculus of gallbladder with acute and chronic cholecystitis without obstruction: Principal | ICD-10-CM | POA: Diagnosis present

## 2022-07-08 DIAGNOSIS — R1310 Dysphagia, unspecified: Secondary | ICD-10-CM | POA: Diagnosis not present

## 2022-07-08 DIAGNOSIS — K819 Cholecystitis, unspecified: Secondary | ICD-10-CM | POA: Diagnosis not present

## 2022-07-08 DIAGNOSIS — E1142 Type 2 diabetes mellitus with diabetic polyneuropathy: Secondary | ICD-10-CM | POA: Diagnosis present

## 2022-07-08 DIAGNOSIS — R932 Abnormal findings on diagnostic imaging of liver and biliary tract: Secondary | ICD-10-CM | POA: Diagnosis not present

## 2022-07-08 DIAGNOSIS — K5710 Diverticulosis of small intestine without perforation or abscess without bleeding: Secondary | ICD-10-CM | POA: Diagnosis present

## 2022-07-08 DIAGNOSIS — F419 Anxiety disorder, unspecified: Secondary | ICD-10-CM | POA: Diagnosis not present

## 2022-07-08 DIAGNOSIS — R079 Chest pain, unspecified: Secondary | ICD-10-CM | POA: Diagnosis not present

## 2022-07-08 DIAGNOSIS — Z7901 Long term (current) use of anticoagulants: Secondary | ICD-10-CM

## 2022-07-08 DIAGNOSIS — J449 Chronic obstructive pulmonary disease, unspecified: Secondary | ICD-10-CM | POA: Diagnosis present

## 2022-07-08 DIAGNOSIS — K219 Gastro-esophageal reflux disease without esophagitis: Secondary | ICD-10-CM | POA: Diagnosis present

## 2022-07-08 DIAGNOSIS — E118 Type 2 diabetes mellitus with unspecified complications: Secondary | ICD-10-CM | POA: Diagnosis not present

## 2022-07-08 DIAGNOSIS — I10 Essential (primary) hypertension: Secondary | ICD-10-CM

## 2022-07-08 DIAGNOSIS — R5383 Other fatigue: Secondary | ICD-10-CM | POA: Diagnosis not present

## 2022-07-08 DIAGNOSIS — F319 Bipolar disorder, unspecified: Secondary | ICD-10-CM | POA: Diagnosis present

## 2022-07-08 DIAGNOSIS — R059 Cough, unspecified: Secondary | ICD-10-CM | POA: Diagnosis not present

## 2022-07-08 DIAGNOSIS — Z87891 Personal history of nicotine dependence: Secondary | ICD-10-CM

## 2022-07-08 DIAGNOSIS — I5022 Chronic systolic (congestive) heart failure: Secondary | ICD-10-CM | POA: Diagnosis not present

## 2022-07-08 DIAGNOSIS — M6281 Muscle weakness (generalized): Secondary | ICD-10-CM | POA: Diagnosis not present

## 2022-07-08 DIAGNOSIS — Z8719 Personal history of other diseases of the digestive system: Secondary | ICD-10-CM | POA: Diagnosis not present

## 2022-07-08 DIAGNOSIS — R41841 Cognitive communication deficit: Secondary | ICD-10-CM | POA: Diagnosis not present

## 2022-07-08 DIAGNOSIS — I480 Paroxysmal atrial fibrillation: Secondary | ICD-10-CM | POA: Diagnosis present

## 2022-07-08 DIAGNOSIS — Z434 Encounter for attention to other artificial openings of digestive tract: Secondary | ICD-10-CM | POA: Diagnosis not present

## 2022-07-08 DIAGNOSIS — F0390 Unspecified dementia without behavioral disturbance: Secondary | ICD-10-CM | POA: Diagnosis not present

## 2022-07-08 DIAGNOSIS — E1151 Type 2 diabetes mellitus with diabetic peripheral angiopathy without gangrene: Secondary | ICD-10-CM

## 2022-07-08 DIAGNOSIS — E119 Type 2 diabetes mellitus without complications: Secondary | ICD-10-CM

## 2022-07-08 DIAGNOSIS — N401 Enlarged prostate with lower urinary tract symptoms: Secondary | ICD-10-CM | POA: Diagnosis not present

## 2022-07-08 DIAGNOSIS — R1312 Dysphagia, oropharyngeal phase: Secondary | ICD-10-CM | POA: Diagnosis not present

## 2022-07-08 DIAGNOSIS — I119 Hypertensive heart disease without heart failure: Secondary | ICD-10-CM | POA: Diagnosis not present

## 2022-07-08 DIAGNOSIS — Z79899 Other long term (current) drug therapy: Secondary | ICD-10-CM | POA: Diagnosis not present

## 2022-07-08 DIAGNOSIS — R531 Weakness: Secondary | ICD-10-CM | POA: Diagnosis not present

## 2022-07-08 DIAGNOSIS — K828 Other specified diseases of gallbladder: Secondary | ICD-10-CM | POA: Diagnosis not present

## 2022-07-08 DIAGNOSIS — R2681 Unsteadiness on feet: Secondary | ICD-10-CM | POA: Diagnosis not present

## 2022-07-08 DIAGNOSIS — K8066 Calculus of gallbladder and bile duct with acute and chronic cholecystitis without obstruction: Secondary | ICD-10-CM | POA: Diagnosis not present

## 2022-07-08 HISTORY — PX: CHOLECYSTECTOMY: SHX55

## 2022-07-08 LAB — CBC
HCT: 43.2 % (ref 39.0–52.0)
Hemoglobin: 13.8 g/dL (ref 13.0–17.0)
MCH: 30.1 pg (ref 26.0–34.0)
MCHC: 31.9 g/dL (ref 30.0–36.0)
MCV: 94.3 fL (ref 80.0–100.0)
Platelets: 174 10*3/uL (ref 150–400)
RBC: 4.58 MIL/uL (ref 4.22–5.81)
RDW: 15.8 % — ABNORMAL HIGH (ref 11.5–15.5)
WBC: 12 10*3/uL — ABNORMAL HIGH (ref 4.0–10.5)
nRBC: 0 % (ref 0.0–0.2)

## 2022-07-08 LAB — GLUCOSE, CAPILLARY
Glucose-Capillary: 74 mg/dL (ref 70–99)
Glucose-Capillary: 148 mg/dL — ABNORMAL HIGH (ref 70–99)

## 2022-07-08 LAB — CREATININE, SERUM
Creatinine, Ser: 1.02 mg/dL (ref 0.61–1.24)
GFR, Estimated: >60 mL/min (ref >60)

## 2022-07-08 SURGERY — LAPAROSCOPIC CHOLECYSTECTOMY WITH INTRAOPERATIVE CHOLANGIOGRAM
Anesthesia: General

## 2022-07-08 MED ORDER — SIMETHICONE 80 MG PO CHEW: 80.0000 mg | CHEWABLE_TABLET | Freq: Four times a day (QID) | ORAL | Status: DC | PRN

## 2022-07-08 MED ORDER — PHENYLEPHRINE HCL-NACL 20-0.9 MG/250ML-% IV SOLN
INTRAVENOUS | Status: DC | PRN
  Administered 2022-07-08: 30 ug/min via INTRAVENOUS

## 2022-07-08 MED ORDER — BUPIVACAINE-EPINEPHRINE (PF) 0.25% -1:200000 IJ SOLN
INTRAMUSCULAR | Status: AC
Start: 2022-07-08 — End: ?
  Filled 2022-07-08: qty 30

## 2022-07-08 MED ORDER — PROPOFOL 10 MG/ML IV BOLUS
INTRAVENOUS | Status: DC | PRN
  Administered 2022-07-08: 20 mg via INTRAVENOUS
  Administered 2022-07-08: 100 mg via INTRAVENOUS

## 2022-07-08 MED ORDER — LACTATED RINGERS IR SOLN
Status: DC | PRN
  Administered 2022-07-08: 1000 mL

## 2022-07-08 MED ORDER — 0.9 % SODIUM CHLORIDE (POUR BTL) OPTIME
TOPICAL | Status: DC | PRN
  Administered 2022-07-08: 1000 mL

## 2022-07-08 MED ORDER — ONDANSETRON HCL 4 MG/2ML IJ SOLN
INTRAMUSCULAR | Status: DC | PRN
  Administered 2022-07-08: 4 mg via INTRAVENOUS

## 2022-07-08 MED ORDER — PROPOFOL 10 MG/ML IV BOLUS
INTRAVENOUS | Status: AC
Start: 2022-07-08 — End: ?
  Filled 2022-07-08: qty 20

## 2022-07-08 MED ORDER — SUGAMMADEX SODIUM 200 MG/2ML IV SOLN
INTRAVENOUS | Status: DC | PRN
  Administered 2022-07-08: 200 mg via INTRAVENOUS

## 2022-07-08 MED ORDER — FENTANYL CITRATE (PF) 100 MCG/2ML IJ SOLN
INTRAMUSCULAR | Status: DC | PRN
Start: 2022-07-08 — End: 2022-07-08
  Administered 2022-07-08 (×2): 50 ug via INTRAVENOUS
  Administered 2022-07-08 (×3): 25 ug via INTRAVENOUS

## 2022-07-08 MED ORDER — BUPIVACAINE LIPOSOME 1.3 % IJ SUSP: 20.0000 mL | Freq: Once | INTRAMUSCULAR | Status: DC

## 2022-07-08 MED ORDER — OXYCODONE HCL 5 MG PO TABS
5.0000 mg | ORAL_TABLET | ORAL | Status: DC | PRN
  Administered 2022-07-09 – 2022-07-13 (×5): 5 mg via ORAL
  Filled 2022-07-08 (×5): qty 1

## 2022-07-08 MED ORDER — OXYCODONE HCL 5 MG PO TABS
ORAL_TABLET | ORAL | Status: AC
  Administered 2022-07-08: 5 mg via ORAL
  Filled 2022-07-08: qty 1

## 2022-07-08 MED ORDER — GABAPENTIN 300 MG PO CAPS
300.0000 mg | ORAL_CAPSULE | Freq: Three times a day (TID) | ORAL | Status: DC
  Administered 2022-07-08 – 2022-07-14 (×15): 300 mg via ORAL
  Filled 2022-07-08 (×16): qty 1

## 2022-07-08 MED ORDER — EPHEDRINE 5 MG/ML INJ
INTRAVENOUS | Status: AC
  Filled 2022-07-08: qty 5

## 2022-07-08 MED ORDER — CEFAZOLIN SODIUM-DEXTROSE 2-4 GM/100ML-% IV SOLN
2.0000 g | INTRAVENOUS | Status: AC
  Administered 2022-07-08: 2 g via INTRAVENOUS
  Filled 2022-07-08: qty 100

## 2022-07-08 MED ORDER — ESMOLOL HCL 100 MG/10ML IV SOLN
INTRAVENOUS | Status: DC | PRN
  Administered 2022-07-08: 20 mg via INTRAVENOUS

## 2022-07-08 MED ORDER — HYDROMORPHONE HCL 1 MG/ML IJ SOLN
0.5000 mg | INTRAMUSCULAR | Status: DC | PRN
  Administered 2022-07-08 – 2022-07-09 (×3): 0.5 mg via INTRAVENOUS
  Filled 2022-07-08 (×3): qty 0.5

## 2022-07-08 MED ORDER — ONDANSETRON HCL 4 MG/2ML IJ SOLN: 4.0000 mg | Freq: Once | INTRAMUSCULAR | Status: DC | PRN

## 2022-07-08 MED ORDER — SODIUM CHLORIDE (PF) 0.9 % IJ SOLN
INTRAMUSCULAR | Status: DC | PRN
  Administered 2022-07-08: 80 mL

## 2022-07-08 MED ORDER — ONDANSETRON HCL 4 MG/2ML IJ SOLN
INTRAMUSCULAR | Status: AC
  Filled 2022-07-08: qty 2

## 2022-07-08 MED ORDER — HEPARIN SODIUM (PORCINE) 5000 UNIT/ML IJ SOLN
5000.0000 [IU] | Freq: Three times a day (TID) | INTRAMUSCULAR | Status: DC
Start: 2022-07-08 — End: 2022-07-12
  Administered 2022-07-08 – 2022-07-12 (×9): 5000 [IU] via SUBCUTANEOUS
  Filled 2022-07-08 (×10): qty 1

## 2022-07-08 MED ORDER — ACETAMINOPHEN 500 MG PO TABS
1000.0000 mg | ORAL_TABLET | ORAL | Status: AC
  Administered 2022-07-08: 1000 mg via ORAL
  Filled 2022-07-08: qty 2

## 2022-07-08 MED ORDER — FENTANYL CITRATE (PF) 250 MCG/5ML IJ SOLN
INTRAMUSCULAR | Status: AC
  Filled 2022-07-08: qty 5

## 2022-07-08 MED ORDER — CHLORHEXIDINE GLUCONATE CLOTH 2 % EX PADS: 6.0000 | MEDICATED_PAD | Freq: Once | CUTANEOUS | Status: DC

## 2022-07-08 MED ORDER — OXYCODONE HCL 5 MG/5ML PO SOLN: 5.0000 mg | Freq: Once | ORAL | Status: AC | PRN

## 2022-07-08 MED ORDER — ACETAMINOPHEN 325 MG PO TABS
650.0000 mg | ORAL_TABLET | Freq: Four times a day (QID) | ORAL | Status: DC
  Administered 2022-07-08 – 2022-07-14 (×20): 650 mg via ORAL
  Filled 2022-07-08 (×22): qty 2

## 2022-07-08 MED ORDER — EPHEDRINE SULFATE (PRESSORS) 50 MG/ML IJ SOLN
INTRAMUSCULAR | Status: DC | PRN
  Administered 2022-07-08: 10 mg via INTRAVENOUS
  Administered 2022-07-08: 5 mg via INTRAVENOUS

## 2022-07-08 MED ORDER — DOCUSATE SODIUM 100 MG PO CAPS
100.0000 mg | ORAL_CAPSULE | Freq: Two times a day (BID) | ORAL | Status: DC
  Administered 2022-07-08 – 2022-07-14 (×11): 100 mg via ORAL
  Filled 2022-07-08 (×11): qty 1

## 2022-07-08 MED ORDER — LACTATED RINGERS IV SOLN: INTRAVENOUS | Status: DC

## 2022-07-08 MED ORDER — GABAPENTIN 300 MG PO CAPS
300.0000 mg | ORAL_CAPSULE | ORAL | Status: AC
  Administered 2022-07-08: 300 mg via ORAL
  Filled 2022-07-08: qty 1

## 2022-07-08 MED ORDER — METHOCARBAMOL 500 MG IVPB - SIMPLE MED: 500.0000 mg | Freq: Four times a day (QID) | INTRAVENOUS | Status: DC | PRN

## 2022-07-08 MED ORDER — DEXAMETHASONE SODIUM PHOSPHATE 10 MG/ML IJ SOLN
INTRAMUSCULAR | Status: DC | PRN
  Administered 2022-07-08: 5 mg via INTRAVENOUS

## 2022-07-08 MED ORDER — HYDROMORPHONE HCL 1 MG/ML IJ SOLN
INTRAMUSCULAR | Status: AC
  Administered 2022-07-08: 0.25 mg via INTRAVENOUS
  Filled 2022-07-08: qty 1

## 2022-07-08 MED ORDER — PROCHLORPERAZINE EDISYLATE 10 MG/2ML IJ SOLN: 10.0000 mg | INTRAMUSCULAR | Status: DC | PRN

## 2022-07-08 MED ORDER — LIDOCAINE HCL (CARDIAC) PF 100 MG/5ML IV SOSY
PREFILLED_SYRINGE | INTRAVENOUS | Status: DC | PRN
  Administered 2022-07-08: 50 mg via INTRAVENOUS

## 2022-07-08 MED ORDER — CHLORHEXIDINE GLUCONATE 0.12 % MT SOLN
15.0000 mL | Freq: Once | OROMUCOSAL | Status: AC
  Administered 2022-07-08: 15 mL via OROMUCOSAL

## 2022-07-08 MED ORDER — HYDRALAZINE HCL 20 MG/ML IJ SOLN
INTRAMUSCULAR | Status: DC | PRN
  Administered 2022-07-08: 2 mg via INTRAVENOUS

## 2022-07-08 MED ORDER — ORAL CARE MOUTH RINSE: 15.0000 mL | Freq: Once | OROMUCOSAL | Status: AC

## 2022-07-08 MED ORDER — PHENYLEPHRINE HCL-NACL 20-0.9 MG/250ML-% IV SOLN
INTRAVENOUS | Status: AC
  Filled 2022-07-08: qty 250

## 2022-07-08 MED ORDER — ROCURONIUM BROMIDE 100 MG/10ML IV SOLN
INTRAVENOUS | Status: DC | PRN
  Administered 2022-07-08 (×2): 20 mg via INTRAVENOUS
  Administered 2022-07-08: 60 mg via INTRAVENOUS

## 2022-07-08 MED ORDER — OXYCODONE HCL 5 MG PO TABS
10.0000 mg | ORAL_TABLET | ORAL | Status: DC | PRN
  Administered 2022-07-12: 10 mg via ORAL
  Filled 2022-07-08: qty 2

## 2022-07-08 MED ORDER — HYDRALAZINE HCL 20 MG/ML IJ SOLN
INTRAMUSCULAR | Status: AC
  Filled 2022-07-08: qty 1

## 2022-07-08 MED ORDER — ONDANSETRON HCL 4 MG/2ML IJ SOLN: 4.0000 mg | Freq: Four times a day (QID) | INTRAMUSCULAR | Status: DC | PRN

## 2022-07-08 MED ORDER — OXYCODONE HCL 5 MG PO TABS: 5.0000 mg | ORAL_TABLET | Freq: Once | ORAL | Status: AC | PRN

## 2022-07-08 MED ORDER — HYDROMORPHONE HCL 1 MG/ML IJ SOLN
0.2500 mg | INTRAMUSCULAR | Status: DC | PRN
  Administered 2022-07-08: 0.25 mg via INTRAVENOUS
  Administered 2022-07-08: 0.5 mg via INTRAVENOUS

## 2022-07-08 SURGICAL SUPPLY — 47 items
APPLIER CLIP ROT 10 11.4 M/L (STAPLE) ×2
BAG COUNTER SPONGE SURGICOUNT (BAG) IMPLANT
CABLE HIGH FREQUENCY MONO STRZ (ELECTRODE) ×3 IMPLANT
CATH URETL OPEN 5X70 (CATHETERS) ×1 IMPLANT
CHLORAPREP W/TINT 26 (MISCELLANEOUS) ×2 IMPLANT
CLIP APPLIE ROT 10 11.4 M/L (STAPLE) ×1 IMPLANT
COVER MAYO STAND STRL (DRAPES) ×1 IMPLANT
COVER MAYO STAND XLG (MISCELLANEOUS) ×3 IMPLANT
COVER SURGICAL LIGHT HANDLE (MISCELLANEOUS) ×2 IMPLANT
DERMABOND ADVANCED (GAUZE/BANDAGES/DRESSINGS) ×1
DERMABOND ADVANCED .7 DNX12 (GAUZE/BANDAGES/DRESSINGS) ×1 IMPLANT
DRAIN CHANNEL RND F F (WOUND CARE) ×1 IMPLANT
DRAPE C-ARM 42X120 X-RAY (DRAPES) ×1 IMPLANT
ELECT REM PT RETURN 15FT ADLT (MISCELLANEOUS) ×2 IMPLANT
ENDOLOOP SUT PDS II  0 18 (SUTURE) ×1
ENDOLOOP SUT PDS II 0 18 (SUTURE) ×1 IMPLANT
EVACUATOR SILICONE 100CC (DRAIN) ×1 IMPLANT
GLOVE BIO SURGEON STRL SZ7.5 (GLOVE) ×2 IMPLANT
GLOVE BIOGEL PI IND STRL 8 (GLOVE) ×1 IMPLANT
GLOVE BIOGEL PI INDICATOR 8 (GLOVE) ×1
GOWN STRL REUS W/ TWL XL LVL3 (GOWN DISPOSABLE) ×2 IMPLANT
GOWN STRL REUS W/TWL XL LVL3 (GOWN DISPOSABLE) ×2
GRASPER SUT TROCAR 14GX15 (MISCELLANEOUS) ×1 IMPLANT
HEMOSTAT SNOW SURGICEL 2X4 (HEMOSTASIS) IMPLANT
IRRIG SUCT STRYKERFLOW 2 WTIP (MISCELLANEOUS) ×2
IRRIGATION SUCT STRKRFLW 2 WTP (MISCELLANEOUS) ×1 IMPLANT
IV CATH 14GX2 1/4 (CATHETERS) ×2 IMPLANT
KIT BASIN OR (CUSTOM PROCEDURE TRAY) ×2 IMPLANT
KIT TURNOVER KIT A (KITS) IMPLANT
NDL INSUFFLATION 14GA 120MM (NEEDLE) ×1 IMPLANT
NEEDLE INSUFFLATION 14GA 120MM (NEEDLE) ×2 IMPLANT
PENCIL SMOKE EVACUATOR (MISCELLANEOUS) IMPLANT
POUCH RETRIEVAL ECOSAC 10 (ENDOMECHANICALS) ×1 IMPLANT
POUCH RETRIEVAL ECOSAC 10MM (ENDOMECHANICALS) ×1
SCISSORS LAP 5X35 DISP (ENDOMECHANICALS) ×2 IMPLANT
SET DUCT BILE COMMON EXP CCDES (MISCELLANEOUS) ×1 IMPLANT
SET TUBE SMOKE EVAC HIGH FLOW (TUBING) ×2 IMPLANT
SLEEVE Z-THREAD 5X100MM (TROCAR) ×4 IMPLANT
SPIKE FLUID TRANSFER (MISCELLANEOUS) ×2 IMPLANT
STOPCOCK 4 WAY LG BORE MALE ST (IV SETS) ×1 IMPLANT
SUT ETHILON 2 0 PS N (SUTURE) ×1 IMPLANT
SUT MNCRL AB 4-0 PS2 18 (SUTURE) ×2 IMPLANT
TOWEL OR 17X26 10 PK STRL BLUE (TOWEL DISPOSABLE) ×2 IMPLANT
TOWEL OR NON WOVEN STRL DISP B (DISPOSABLE) IMPLANT
TRAY LAPAROSCOPIC (CUSTOM PROCEDURE TRAY) ×2 IMPLANT
TROCAR ADV FIXATION 12X100MM (TROCAR) ×2 IMPLANT
TROCAR Z-THREAD OPTICAL 5X100M (TROCAR) ×2 IMPLANT

## 2022-07-08 NOTE — Anesthesia Postprocedure Evaluation (Signed)
Anesthesia Post Note  Patient: Douglas Hurst.  Procedure(s) Performed: LAPAROSCOPIC CHOLECYSTECTOMY WITH INTRAOPERATIVE CHOLANGIOGRAM AND LAPAROSCOPIC COMMON DUCT EXPLORATION     Patient location during evaluation: PACU Anesthesia Type: General Level of consciousness: awake and alert Pain management: pain level controlled Vital Signs Assessment: post-procedure vital signs reviewed and stable Respiratory status: spontaneous breathing, nonlabored ventilation, respiratory function stable and patient connected to nasal cannula oxygen Cardiovascular status: blood pressure returned to baseline and stable Postop Assessment: no apparent nausea or vomiting Anesthetic complications: no   No notable events documented.  Last Vitals:  Vitals:   07/08/22 0800 07/08/22 1315  BP: (!) 155/76 131/61  Pulse: (!) 52 67  Resp: 16 20  Temp: (!) 36.4 C 36.5 C  SpO2: 99% 100%    Last Pain:  Vitals:   07/08/22 1315  TempSrc:   PainSc: 0-No pain                 Yanni Quiroa,Danta S

## 2022-07-08 NOTE — Progress Notes (Signed)
Patient was seen in the OR and case discussed with the surgical team and I was present for the cholangiograms and attempts at advancing the wire into the duodenum and initially thought the wire was curling in the distal duct however it may have been just curling in the diverticulum but we believe there is still some CBD stones and he would benefit from a second attempt at ERCP tomorrow

## 2022-07-08 NOTE — H&P (Signed)
Admitting Physician: Nickola Major Quetzal Meany  Service: General Surgery  CC: Cholecystitis  Subjective   HPI: Douglas Hurst. is an 81 y.o. male who is here for laparoscopic cholecystectomy.  Past Medical History:  Diagnosis Date   Bipolar disorder ([place])    Chronic back pain    Complication of anesthesia    prolonged sedation and confusion   COPD (chronic obstructive pulmonary disease) (HCC)    Dr. Laurann Montana   Diabetes mellitus without complication ([place])    type 2   Diverticulosis    Dysrhythmia    Afib  onset 03-2022   ED (erectile dysfunction)    Gallstones    GERD (gastroesophageal reflux disease)    Hiatal hernia    Hyperlipidemia    Hypertension    Wears dentures    upper   Wears glasses    Wears hearing aid in both ears     Past Surgical History:  Procedure Laterality Date   BALLOON DILATION N/A 10/01/2015   Procedure: BALLOON DILATION;  Surgeon: Garlan Fair, MD;  [facility];  Service: Endoscopy;  Laterality: N/A;   CATARACT EXTRACTION, BILATERAL     COLONOSCOPY     removed polyps   COLONOSCOPY WITH PROPOFOL N/A 09/01/2016   Procedure: COLONOSCOPY WITH PROPOFOL;  Surgeon: Garlan Fair, MD;  [facility];  Service: Endoscopy;  Laterality: N/A;   ESOPHAGEAL DILATION  06/23/2022   Procedure: PYLORIC  DILATION;  Surgeon: Clarene Essex, MD;  [facility];  Service: Gastroenterology;;   ESOPHAGOGASTRODUODENOSCOPY N/A 06/23/2022   Procedure: ESOPHAGOGASTRODUODENOSCOPY (EGD);  Surgeon: Clarene Essex, MD;  Location[facility];  Service: Gastroenterology;  Laterality: N/A;   ESOPHAGOGASTRODUODENOSCOPY (EGD) WITH PROPOFOL N/A 10/01/2015   Procedure: ESOPHAGOGASTRODUODENOSCOPY (EGD) WITH PROPOFOL;  Surgeon: Garlan Fair, MD;  [facility];  Service: Endoscopy;  Laterality: N/A;   HEMOSTASIS CLIP PLACEMENT  06/23/2022   Procedure: HEMOSTASIS CLIP PLACEMENT;  Surgeon: Clarene Essex, MD;  [facility];  Service:  Gastroenterology;;   IR EXCHANGE BILIARY DRAIN  05/15/2022   IR EXCHANGE BILIARY DRAIN  05/26/2022   IR EXCHANGE BILIARY DRAIN  06/11/2022   IR EXCHANGE BILIARY DRAIN  06/23/2022   IR PERC CHOLECYSTOSTOMY  04/26/2022   KNEE ARTHROSCOPY     TONSILLECTOMY      Family History  Problem Relation Age of Onset   Heart attack Mother    Cancer - Colon Mother    CAD Mother    Cancer Mother    Dementia Mother    CAD Father    Cancer Father    CVA Father    Dementia Father    Heart attack Brother    CAD Brother     Social:  reports that he has quit smoking. His smoking use included cigarettes. He has never used smokeless tobacco. He reports that he does not drink alcohol and does not use drugs.  Allergies: No Known Allergies  Medications: Current Outpatient Medications  Medication Instructions   acetaminophen (TYLENOL) 650 mg, Oral, Every 6 hours PRN   amiodarone (PACERONE) 200 mg, Oral, Daily   apixaban (ELIQUIS) 5 mg, Oral, 2 times daily   atorvastatin (LIPITOR) 40 mg, Oral, Every evening   metFORMIN (GLUCOPHAGE-XR) 500-1,000 mg, Oral, See admin instructions, Take 2 tablets (1000 mg) in the morning and 1 tablet (500 mg) at night   metoprolol tartrate (LOPRESSOR) 25 mg, Oral, 2 times daily   Multiple Vitamin (MULTIVITAMIN) tablet 1 tablet, Oral, Daily   ondansetron (ZOFRAN) 4  mg, Oral, Every 6 hours PRN   pantoprazole (PROTONIX) 40 mg, Oral, Every morning   polyethylene glycol powder (GLYCOLAX/MIRALAX) 17 g, Oral, Daily   senna-docusate (SENOKOT-S) 8.6-50 MG tablet 1 tablet, Oral, Daily at bedtime   SPIRIVA RESPIMAT 2.5 MCG/ACT AERS 2 puffs, Inhalation, Daily PRN   tadalafil (CIALIS) 20 mg, Oral, Every 3 DAYS PRN   tamsulosin (FLOMAX) 0.4 mg, Oral, Daily after supper    ROS - all of the below systems have been reviewed with the patient and positives are indicated with bold text General: chills, fever or night sweats Eyes: blurry vision or double vision ENT: epistaxis or sore  throat Allergy/Immunology: itchy/watery eyes or nasal congestion Hematologic/Lymphatic: bleeding problems, blood clots or swollen lymph nodes Endocrine: temperature intolerance or unexpected weight changes Breast: new or changing breast lumps or nipple discharge Resp: cough, shortness of breath, or wheezing CV: chest pain or dyspnea on exertion GI: as per HPI GU: dysuria, trouble voiding, or hematuria MSK: joint pain or joint stiffness Neuro: TIA or stroke symptoms Derm: pruritus and skin lesion changes Psych: anxiety and depression  Objective   PE Blood pressure (!) 155/76, pulse (!) 52, temperature (!) 97.5 F (36.4 C), temperature source Oral, resp. rate 16, height '6\' 1"'$  (1.854 m), weight 82.6 kg, SpO2 99 %. Constitutional: NAD; conversant; no deformities Eyes: Moist conjunctiva; no lid lag; anicteric; PERRL Neck: Trachea midline; no thyromegaly Lungs: Normal respiratory effort; no tactile fremitus CV: RRR; no palpable thrills; no pitting edema GI: Abd Soft, nontender; no palpable hepatosplenomegaly MSK: Normal range of motion of extremities; no clubbing/cyanosis Psychiatric: Appropriate affect; alert and oriented x3 Lymphatic: No palpable cervical or axillary lymphadenopathy  Results for orders placed or performed during the hospital encounter of 07/08/22 (from the past 24 hour(s))  Glucose, capillary     Status: None   Collection Time: 07/08/22  7:57 AM  Result Value Ref Range   Glucose-Capillary 74 70 - 99 mg/dL    Imaging Orders  No imaging studies ordered today   CT abdomen and pelvis 04/25/22 5 mm stone within the mid common bile duct with mild proximal biliary dilatation. There is also a 1.3 cm gallstone at the neck with mild pericholecystic fat infiltration raising the possibility of acute cholecystitis.   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 This may represent a small increase since the 2021 ultrasound. Recommend follow-up every 12 months and  vascular consultation.   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  IR cholangiogram 04/26/2022 Successful placement of a 10 Fr cholecystostomy drainage catheter, as above.  IR cholangiogram 05/15/2022 1. Cholecystostomy tube was successfully exchanged with fluoroscopy. 2. Gallbladder has multiple filling defects with thick yellow fluid coming out of the gallbladder drain. Findings are concerning for underlying infection. Fluid was sent for culture and the patient was started on oral Augmentin. 3. Cystic duct is patent with some filling of the intrahepatic bile ducts. Limited evaluation of the distal common bile duct..  IR cholangiogram 05/26/2022 Exchange and up sizing of percutaneous cholecystostomy tube to 12 [place]. The gallbladder appears decompressed.  ERCP 06/23/22 (Dr. Watt Climes) The major papilla was located entirely within a diverticulum. - The major papilla appeared normal. - Duodenal diverticulum. Multiple - One hemostatic clip was successfully placed (MR conditional) in the area of the papilla. However it had fallen off by the time we readvanced the ERCP scope and the previously malfunction clip was removed as above - Pylorus were successfully dilated to 15  mm x 2. And no additional findings using both the straight and the ERCP scopes as above and cannulation was unsuccessful   Assessment and Plan   Cholecystitis  Choledocholithiasis    Mr. Slape had severe cholecystitis which has been managed with IR percutaneous cholecystostomy tube. He had choledocholithiasis noted on his initial CT scan. Follow-up cholangiogram via the cholecystostomy tube demonstrated some concern for filling defects in the hepatic ducts.  He underwent ERCP with Dr. Watt Climes on 06/23/22, however the duct was unable to be cannulated.  Today he presents for laparoscopic cholecystectomy with IOC and possible laparoscopic common duct  exploration.  We discussed its risk, benefits, and alternatives. We discussed the option of removing the cholecystostomy tube and avoiding surgery with the risk of recurrent cholecystitis. We discussed the risk of possible open operation, possible need for additional procedures or drains postoperatively.  He is relatively healthy and has many years ahead of him, we decided it would be best to proceed with surgery. After full discussion all questions answered patient granted consent to proceed.   Felicie Morn, MD  [facility] Use [email or web address] to contact on call provider

## 2022-07-08 NOTE — Anesthesia Preprocedure Evaluation (Addendum)
Anesthesia Evaluation  Patient identified by MRN, date of birth, ID band Patient awake    Reviewed: Allergy & Precautions, NPO status , Patient's Chart, lab work & pertinent test results  Airway Mallampati: II  TM Distance: >3 FB Neck ROM: Full    Dental  (+) Edentulous Upper, Edentulous Lower   Pulmonary COPD, former smoker,    Pulmonary exam normal breath sounds clear to auscultation       Cardiovascular hypertension, + Peripheral Vascular Disease  Normal cardiovascular exam+ dysrhythmias Atrial Fibrillation  Rhythm:Irregular Rate:Normal  Left ventricular ejection fraction, by estimation, is 60 to 65%. The left ventricle has normal function. The left ventricle has no regional wall motion abnormalities. Left ventricular diastolic parameters are consistent with Grade I diastolic dysfunction (impaired relaxation). 1. Right ventricular systolic function is normal. The right ventricular size is normal. There is normal pulmonary artery systolic pressure. The estimated right ventricular systolic pressure is 23.7 mmHg. 2. 3. Left atrial size was mildly dilated. 4. The mitral valve is abnormal. Trivial mitral valve regurgitation. The aortic valve is tricuspid. Aortic valve regurgitation is trivial. Aortic valve sclerosis is present, with no evidence of aortic valve stenosis. 5. Aortic dilatation noted. There is mild dilatation of the ascending aorta, measuring 40 mm. 6. The inferior vena cava is normal in size with greater than 50% respiratory variability, suggesting right atrial pressure of 3 mmHg. 7. Comparison(s): Changes from prior study are noted. 4/60/2023: LVEF 40-45%.   Neuro/Psych negative neurological ROS  negative psych ROS   GI/Hepatic Neg liver ROS, GERD  ,  Endo/Other  diabetes  Renal/GU negative Renal ROS  negative genitourinary   Musculoskeletal negative musculoskeletal ROS (+)   Abdominal    Peds negative pediatric ROS (+)  Hematology negative hematology ROS (+)   Anesthesia Other Findings   Reproductive/Obstetrics negative OB ROS                           Anesthesia Physical Anesthesia Plan  ASA: 3  Anesthesia Plan: General   Post-op Pain Management: Tylenol PO (pre-op)*   Induction: Intravenous  PONV Risk Score and Plan: 2 and Ondansetron, Dexamethasone and Treatment may vary due to age or medical condition  Airway Management Planned: Oral ETT  Additional Equipment:   Intra-op Plan:   Post-operative Plan: Extubation in OR  Informed Consent: I have reviewed the patients History and Physical, chart, labs and discussed the procedure including the risks, benefits and alternatives for the proposed anesthesia with the patient or authorized representative who has indicated his/her understanding and acceptance.     Dental advisory given  Plan Discussed with: CRNA and Surgeon  Anesthesia Plan Comments:       Anesthesia Quick Evaluation

## 2022-07-08 NOTE — Transfer of Care (Signed)
Immediate Anesthesia Transfer of Care Note  Patient: Tyree Fluharty.  Procedure(s) Performed: LAPAROSCOPIC CHOLECYSTECTOMY WITH INTRAOPERATIVE CHOLANGIOGRAM AND LAPAROSCOPIC COMMON DUCT EXPLORATION  Patient Location: PACU  Anesthesia Type:General  Level of Consciousness: awake, alert , oriented and patient cooperative  Airway & Oxygen Therapy: Patient Spontanous Breathing and Patient connected to face mask oxygen  Post-op Assessment: Report given to RN and Post -op Vital signs reviewed and stable  Post vital signs: Reviewed and stable  Last Vitals:  Vitals Value Taken Time  BP 157/62 07/08/22 1308  Temp    Pulse 68 07/08/22 1312  Resp 19 07/08/22 1312  SpO2 100 % 07/08/22 1312  Vitals shown include unvalidated device data.  Last Pain:  Vitals:   07/08/22 0813  TempSrc:   PainSc: 0-No pain      Patients Stated Pain Goal: 3 (59/56/38 7564)  Complications: No notable events documented.

## 2022-07-08 NOTE — Anesthesia Procedure Notes (Signed)
Procedure Name: Intubation Date/Time: 07/08/2022 10:35 AM  Performed by: Garrel Ridgel, CRNAPre-anesthesia Checklist: Patient identified, Emergency Drugs available, Suction available and Patient being monitored Patient Re-evaluated:Patient Re-evaluated prior to induction Oxygen Delivery Method: Circle system utilized Preoxygenation: Pre-oxygenation with 100% oxygen Induction Type: IV induction Ventilation: Mask ventilation without difficulty Laryngoscope Size: Mac and 4 Grade View: Grade II Tube type: Oral Tube size: 7.5 mm Number of attempts: 1 Airway Equipment and Method: Stylet and Oral airway Placement Confirmation: ETT inserted through vocal cords under direct vision, positive ETCO2 and breath sounds checked- equal and bilateral Secured at: 23 cm Tube secured with: Tape Dental Injury: Teeth and Oropharynx as per pre-operative assessment

## 2022-07-08 NOTE — Op Note (Signed)
Patient: Douglas Hurst ([birth year], [id number])  Date of Surgery: 07/08/2022   Preoperative Diagnosis: CHOLECYSTITIS, CHOLEDOCHOLITHIASIS   Postoperative Diagnosis: CHOLECYSTITIS, CHOLEDOCHOLITHIASIS   Surgical Procedure: LAPAROSCOPIC CHOLECYSTECTOMY WITH INTRAOPERATIVE CHOLANGIOGRAM AND LAPAROSCOPIC COMMON DUCT EXPLORATION: 28315 (CPT)   Operative Team Members:  Surgeon(s) and Role:    * Pershing Skidmore, Nickola Major, MD - Primary   Anesthesiologist: Myrtie Soman, MD CRNA: Garrel Ridgel, CRNA; Niel Hummer, CRNA   Anesthesia: General   Fluids:  Total I/O In: 1350 [I.V.:1250; IV Piggyback:100] Out: 20 [VVOHY:07]  Complications: None  Drains:  (19) Jackson-Pratt drain(s) with closed bulb suction in the RUQ    Specimen:  ID Type Source Tests Collected by Time Destination  1 : gallbladder Tissue PATH Other SURGICAL PATHOLOGY Emalee Knies, Nickola Major, MD 07/08/2022 1232      Disposition:  PACU - hemodynamically stable.  Plan of Care: Admit for overnight observation, ERCP with Dr. Watt Climes tomorrow    Indications for Procedure: [name] had severe cholecystitis which has been managed with IR percutaneous cholecystostomy tube. He had choledocholithiasis noted on his initial CT scan. Follow-up cholangiogram via the cholecystostomy tube demonstrated some concern for filling defects in the hepatic ducts.  He underwent ERCP with Dr. Watt Climes on 06/23/22, however the duct was unable to be cannulated.  Today he presents for laparoscopic cholecystectomy with IOC and possible laparoscopic common duct exploration.  We discussed its risk, benefits, and alternatives. We discussed the option of removing the cholecystostomy tube and avoiding surgery with the risk of recurrent cholecystitis. We discussed the risk of possible open operation, possible need for additional procedures or drains postoperatively.  He is relatively healthy and has many years ahead of him, we decided it would be best  to proceed with surgery. After full discussion all questions answered patient granted consent to proceed.  Findings: Inflamed gallbladder, choledocholithiasis unable to be completely cleared via laparoscopic trans-cystic common bile duct exploration  Infection status: Patient: Private Patient Elective Case Case: Elective Infection Present At Time Of Surgery (PATOS):  Some spillage of bile related to performing cholangiogram and duct exploration   Description of Procedure:   On the date stated above, the patient was taken to the operating room suite and placed in supine positioning.  Sequential compression devices were placed on the lower extremities to prevent blood clots.  General endotracheal anesthesia was induced. Preoperative antibiotics were given.  The patient's abdomen was prepped and draped in the usual sterile fashion.  A time-out was completed verifying the correct patient, procedure, positioning and equipment needed for the case.  We began by anesthetizing the skin with local anesthetic and then making a 5 mm incision just below the umbilicus.  We dissected through the subcutaneous tissues to the fascia.  The fascia was grasped and elevated using a Kocher clamp.  A Veress needle was inserted into the abdomen and the abdomen was insufflated to 15 mmHg.  A 5 mm trocar was inserted in this position under optical guidance and then the abdomen was inspected.  There was no trauma to the underlying viscera with initial trocar placement.  Any abnormal findings, other than inflammation in the right upper quadrant, are listed above in the findings section.  Three additional trocars were placed, one 12 mm trocar in the subxiphoid position, one 5 mm trocar in the midline epigastric area and one 43m trocar in the right upper quadrant subcostally.  These were placed under direct vision without any trauma to the underlying viscera.  The patient was then placed in head up, left side down positioning.   The gallbladder was identified and dissected free from its attachments to the omentum allowing the duodenum to fall away.  The infundibulum of the gallbladder was dissected free working laterally to medially.  The cystic duct and cystic artery were dissected free from surrounding connective tissue.  The infundibulum of the gallbladder was dissected off the cystic plate.  A critical view of safety was obtained with the cystic duct and cystic artery being cleared of connective tissues and clearly the only two structures entering into the gallbladder with the liver clearly visible behind.  One clip was applied high on the cystic duct.  A small ductotomy was created below this using the endoscopic shears.  A cholangiogram catheter was introduced through the abdominal wall and into the cystic duct through this ductotomy.  The catheter was held in place using the cholangiogram passer.  The catheter was flushed to ensure no leakage around the clip.  We then removed the laparoscopic instruments and positioned the C-Arm to perform a cholangiogram.  The catheter was flushed with contrast under fluoroscopic visualization and a cholangiogram was obtained.  The cholangiogram visualized the biliary tree from the ampulla up to the first two biliary radicals in the liver.  There were multiple filling defects distally, consistent with choledocholithiasis near the ampulla.  These stones appear large.  The catheter clearly entered the cystic duct.  The ampulla was not well visualized, and there may be stones lodged at the ampulla.  As the patient has previously failed ERCP, I attempted a laparoscopic common duct exploration.  A wire was passed through the catheter.  Unfortunately, the wire was not able to be passed into the duodenum.  The choledocho scope was inserted over the wire, but I was unable to maintain access to the duct system as the wire would flip out of the cystic duct, as it was not able to be inserted all the way into  the duodenum.  I used the basket under fluoroscopic guidance and attempted to crush up the stones.  I feel I was able to crush some of the stones, but on follow-up cholangiogram it appeared there was still some obstruction and abnormality at the ampulla.  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  There was some flow of contrast into the duodenum.  Dr. Watt Climes was called for an intraoperative consultation.  He attempted to help with advice for successful clearing of the duct laparoscopically, however eventually were unable to fully clear the duct.  Decision was made to retry ERCP tomorrow.  I then proceeded with the remainder of the laparoscopic cholecystectomy.    Clips were then applied to the cystic duct and cystic artery and then these structures were divided.  A PDS Endoloop was placed on the cystic duct stump.  The gallbladder was dissected off the cystic plate, placed in an endocatch bag and removed from the 12 mm subxiphoid port site.  The clips were inspected and appeared effective.  The cystic plate was inspected and hemostasis was obtained using electrocautery.  A suction irrigator was used to clean the operative field.  Attention was turned to closure.  A 19 French round JP drain was inserted through the right abdominal port site and positioned in the gallbladder fossa.  The 12 mm subxiphoid port site was closed using a 0-vicryl suture on a fascial suture passer.  The abdomen was desufflated.  The skin was closed using 4-0 monocryl and dermabond.  All sponge and needle counts were correct at the conclusion of the case.    Louanna Raw, MD General, Bariatric, & Minimally Invasive Surgery [facility], [place]

## 2022-07-09 ENCOUNTER — Encounter (HOSPITAL_COMMUNITY)
Admission: RE | Disposition: A | Discharge status: Skilled Nursing Facility | Payer: Self-pay | Source: Home / Self Care | Attending: Surgery

## 2022-07-09 ENCOUNTER — Encounter (HOSPITAL_COMMUNITY): Payer: Self-pay | Admitting: Surgery

## 2022-07-09 ENCOUNTER — Observation Stay (HOSPITAL_COMMUNITY): Payer: Medicare HMO | Admitting: Certified Registered"

## 2022-07-09 ENCOUNTER — Ambulatory Visit (HOSPITAL_COMMUNITY): Admission: RE | Admit: 2022-07-09 | Payer: Medicare HMO | Source: Ambulatory Visit | Admitting: Gastroenterology

## 2022-07-09 ENCOUNTER — Observation Stay (HOSPITAL_COMMUNITY): Payer: Medicare HMO

## 2022-07-09 DIAGNOSIS — Z8719 Personal history of other diseases of the digestive system: Secondary | ICD-10-CM | POA: Diagnosis not present

## 2022-07-09 DIAGNOSIS — K8050 Calculus of bile duct without cholangitis or cholecystitis without obstruction: Secondary | ICD-10-CM | POA: Diagnosis not present

## 2022-07-09 HISTORY — PX: ERCP: SHX5425

## 2022-07-09 HISTORY — PX: FISTULOTOMY: SHX6413

## 2022-07-09 LAB — HEPATIC FUNCTION PANEL
ALT: 408 U/L — ABNORMAL HIGH (ref 0–44)
AST: 365 U/L — ABNORMAL HIGH (ref 15–41)
Albumin: 3.4 g/dL — ABNORMAL LOW (ref 3.5–5.0)
Alkaline Phosphatase: 96 U/L (ref 38–126)
Bilirubin, Direct: 0.4 mg/dL — ABNORMAL HIGH (ref 0.0–0.2)
Indirect Bilirubin: 0.8 mg/dL (ref 0.3–0.9)
Total Bilirubin: 1.2 mg/dL (ref 0.3–1.2)
Total Protein: 6.5 g/dL (ref 6.5–8.1)

## 2022-07-09 LAB — LIPASE, BLOOD: Lipase: 21 U/L (ref 11–51)

## 2022-07-09 LAB — CBC
HCT: 40.1 % (ref 39.0–52.0)
Hemoglobin: 12.9 g/dL — ABNORMAL LOW (ref 13.0–17.0)
MCH: 30.3 pg (ref 26.0–34.0)
MCHC: 32.2 g/dL (ref 30.0–36.0)
MCV: 94.1 fL (ref 80.0–100.0)
Platelets: 206 10*3/uL (ref 150–400)
RBC: 4.26 MIL/uL (ref 4.22–5.81)
RDW: 15.9 % — ABNORMAL HIGH (ref 11.5–15.5)
WBC: 14 10*3/uL — ABNORMAL HIGH (ref 4.0–10.5)
nRBC: 0 % (ref 0.0–0.2)

## 2022-07-09 LAB — BASIC METABOLIC PANEL
Anion gap: 13 (ref 5–15)
BUN: 18 mg/dL (ref 8–23)
CO2: 23 mmol/L (ref 22–32)
Calcium: 9.3 mg/dL (ref 8.9–10.3)
Chloride: 103 mmol/L (ref 98–111)
Creatinine, Ser: 1 mg/dL (ref 0.61–1.24)
GFR, Estimated: >60 mL/min (ref >60)
Glucose, Bld: 184 mg/dL — ABNORMAL HIGH (ref 70–99)
Potassium: 4.4 mmol/L (ref 3.5–5.1)
Sodium: 139 mmol/L (ref 135–145)

## 2022-07-09 LAB — GLUCOSE, CAPILLARY
Glucose-Capillary: 162 mg/dL — ABNORMAL HIGH (ref 70–99)
Glucose-Capillary: 133 mg/dL — ABNORMAL HIGH (ref 70–99)
Glucose-Capillary: 138 mg/dL — ABNORMAL HIGH (ref 70–99)

## 2022-07-09 LAB — SURGICAL PATHOLOGY

## 2022-07-09 SURGERY — ERCP, WITH INTERVENTION IF INDICATED
Anesthesia: General

## 2022-07-09 MED ORDER — LIDOCAINE 2% (20 MG/ML) 5 ML SYRINGE
INTRAMUSCULAR | Status: DC | PRN
  Administered 2022-07-09: 80 mg via INTRAVENOUS

## 2022-07-09 MED ORDER — PROPOFOL 10 MG/ML IV BOLUS
INTRAVENOUS | Status: DC | PRN
  Administered 2022-07-09: 100 mg via INTRAVENOUS

## 2022-07-09 MED ORDER — TAMSULOSIN HCL 0.4 MG PO CAPS
0.4000 mg | ORAL_CAPSULE | Freq: Every day | ORAL | Status: DC
  Administered 2022-07-09 – 2022-07-13 (×5): 0.4 mg via ORAL
  Filled 2022-07-09 (×5): qty 1

## 2022-07-09 MED ORDER — SODIUM CHLORIDE 0.9 % IV SOLN: INTRAVENOUS | Status: DC

## 2022-07-09 MED ORDER — LACTATED RINGERS IV SOLN: INTRAVENOUS | Status: DC | PRN

## 2022-07-09 MED ORDER — SODIUM CHLORIDE 0.9 % IV SOLN
INTRAVENOUS | Status: DC | PRN
  Administered 2022-07-09: 8 mL

## 2022-07-09 MED ORDER — ATORVASTATIN CALCIUM 40 MG PO TABS
40.0000 mg | ORAL_TABLET | Freq: Every evening | ORAL | Status: DC
  Administered 2022-07-09 – 2022-07-13 (×5): 40 mg via ORAL
  Filled 2022-07-09 (×5): qty 1

## 2022-07-09 MED ORDER — DICLOFENAC SUPPOSITORY 100 MG
RECTAL | Status: AC
  Filled 2022-07-09: qty 1

## 2022-07-09 MED ORDER — FENTANYL CITRATE (PF) 250 MCG/5ML IJ SOLN
INTRAMUSCULAR | Status: DC | PRN
  Administered 2022-07-09 (×2): 50 ug via INTRAVENOUS

## 2022-07-09 MED ORDER — TIOTROPIUM BROMIDE MONOHYDRATE 2.5 MCG/ACT IN AERS: 2.0000 | INHALATION_SPRAY | Freq: Every day | RESPIRATORY_TRACT | Status: DC | PRN

## 2022-07-09 MED ORDER — AMIODARONE HCL 200 MG PO TABS
200.0000 mg | ORAL_TABLET | Freq: Every day | ORAL | Status: DC
  Administered 2022-07-09 – 2022-07-14 (×6): 200 mg via ORAL
  Filled 2022-07-09 (×6): qty 1

## 2022-07-09 MED ORDER — CIPROFLOXACIN IN D5W 400 MG/200ML IV SOLN
INTRAVENOUS | Status: AC
  Filled 2022-07-09: qty 200

## 2022-07-09 MED ORDER — SUCCINYLCHOLINE CHLORIDE 200 MG/10ML IV SOSY
PREFILLED_SYRINGE | INTRAVENOUS | Status: DC | PRN
  Administered 2022-07-09: 100 mg via INTRAVENOUS

## 2022-07-09 MED ORDER — LACTATED RINGERS IV SOLN: INTRAVENOUS | Status: DC

## 2022-07-09 MED ORDER — INSULIN ASPART 100 UNIT/ML IJ SOLN
0.0000 [IU] | Freq: Three times a day (TID) | INTRAMUSCULAR | Status: DC
  Administered 2022-07-10: 2 [IU] via SUBCUTANEOUS
  Administered 2022-07-11: 1 [IU] via SUBCUTANEOUS
  Administered 2022-07-12: 2 [IU] via SUBCUTANEOUS
  Administered 2022-07-13: 3 [IU] via SUBCUTANEOUS
  Administered 2022-07-14: 2 [IU] via SUBCUTANEOUS

## 2022-07-09 MED ORDER — INSULIN ASPART 100 UNIT/ML IJ SOLN: 0.0000 [IU] | Freq: Every day | INTRAMUSCULAR | Status: DC

## 2022-07-09 MED ORDER — PHENYLEPHRINE 80 MCG/ML (10ML) SYRINGE FOR IV PUSH (FOR BLOOD PRESSURE SUPPORT)
PREFILLED_SYRINGE | INTRAVENOUS | Status: DC | PRN
  Administered 2022-07-09 (×2): 160 ug via INTRAVENOUS

## 2022-07-09 MED ORDER — ONDANSETRON HCL 4 MG/2ML IJ SOLN
INTRAMUSCULAR | Status: DC | PRN
  Administered 2022-07-09: 4 mg via INTRAVENOUS

## 2022-07-09 MED ORDER — CIPROFLOXACIN IN D5W 400 MG/200ML IV SOLN
INTRAVENOUS | Status: DC | PRN
  Administered 2022-07-09: 400 mg via INTRAVENOUS

## 2022-07-09 MED ORDER — PANTOPRAZOLE SODIUM 40 MG PO TBEC
40.0000 mg | DELAYED_RELEASE_TABLET | Freq: Every morning | ORAL | Status: DC
  Administered 2022-07-09 – 2022-07-14 (×6): 40 mg via ORAL
  Filled 2022-07-09 (×6): qty 1

## 2022-07-09 MED ORDER — GLUCAGON HCL RDNA (DIAGNOSTIC) 1 MG IJ SOLR
INTRAMUSCULAR | Status: AC
  Filled 2022-07-09: qty 1

## 2022-07-09 MED ORDER — MIDAZOLAM HCL 2 MG/2ML IJ SOLN
INTRAMUSCULAR | Status: AC
  Filled 2022-07-09: qty 2

## 2022-07-09 MED ORDER — DICLOFENAC SUPPOSITORY 100 MG
RECTAL | Status: DC | PRN
  Administered 2022-07-09: 100 mg via RECTAL

## 2022-07-09 MED ORDER — PHENYLEPHRINE HCL-NACL 20-0.9 MG/250ML-% IV SOLN
INTRAVENOUS | Status: DC | PRN
  Administered 2022-07-09: 25 ug/min via INTRAVENOUS

## 2022-07-09 MED ORDER — FENTANYL CITRATE (PF) 100 MCG/2ML IJ SOLN
INTRAMUSCULAR | Status: AC
  Filled 2022-07-09: qty 2

## 2022-07-09 MED ORDER — UMECLIDINIUM BROMIDE 62.5 MCG/ACT IN AEPB: 1.0000 | INHALATION_SPRAY | Freq: Every day | RESPIRATORY_TRACT | Status: DC | PRN

## 2022-07-09 MED ORDER — METOPROLOL TARTRATE 25 MG PO TABS
25.0000 mg | ORAL_TABLET | Freq: Two times a day (BID) | ORAL | Status: DC
  Administered 2022-07-09 – 2022-07-14 (×11): 25 mg via ORAL
  Filled 2022-07-09 (×11): qty 1

## 2022-07-09 NOTE — Progress Notes (Signed)
Progress Note: General Surgery Service   Chief Complaint/Subjective: Mr. Monie is doing well postop day 1.  Having some pain.  Had some urinary retention requiring straight cath this morning.  Objective: Vital signs in last 24 hours: Temp:  [97.5 F (36.4 C)-98 F (36.7 C)] 97.7 F (36.5 C) (07/13 0537) Pulse Rate:  [64-82] 82 (07/13 0537) Resp:  [16-20] 18 (07/13 0537) BP: (129-175)/(56-90) 166/90 (07/13 0537) SpO2:  [91 %-100 %] 99 % (07/13 0537)    Intake/Output from previous day: 07/12 0701 - 07/13 0700 In: 2105 [P.O.:120; I.V.:1885; IV Piggyback:100] Out: 1040 [Urine:850; Drains:170; Blood:20] Intake/Output this shift: No intake/output data recorded.  GI: Abd soft, tender around incisions, JP drain serosanguineous without bile in drain.  Lab Results: CBC  Recent Labs    07/08/22 1559 07/09/22 0627  WBC 12.0* 14.0*  HGB 13.8 12.9*  HCT 43.2 40.1  PLT 174 206   BMET Recent Labs    07/08/22 1559 07/09/22 0627  NA  --  139  K  --  4.4  CL  --  103  CO2  --  23  GLUCOSE  --  184*  BUN  --  18  CREATININE 1.02 1.00  CALCIUM  --  9.3   PT/INR No results for input(s): "LABPROT", "INR" in the last 72 hours. ABG No results for input(s): "PHART", "HCO3" in the last 72 hours.  Invalid input(s): "PCO2", "PO2"  Anti-infectives: Anti-infectives (From admission, onward)    Start     Dose/Rate Route Frequency Ordered Stop   07/08/22 0800  ceFAZolin (ANCEF) IVPB 2g/100 mL premix        2 g 200 mL/hr over 30 Minutes Intravenous On call to O.R. 07/08/22 0751 07/08/22 1049       Medications: Scheduled Meds:  acetaminophen  650 mg Oral Q6H   docusate sodium  100 mg Oral BID   gabapentin  300 mg Oral TID   heparin injection (subcutaneous)  5,000 Units Subcutaneous Q8H   Continuous Infusions:  lactated ringers 50 mL/hr at 07/08/22 1810   methocarbamol (ROBAXIN) IV     PRN Meds:.HYDROmorphone (DILAUDID) injection, methocarbamol (ROBAXIN) IV, ondansetron  (ZOFRAN) IV, oxyCODONE, oxyCODONE, prochlorperazine, simethicone  Assessment/Plan: s/p Procedure(s): LAPAROSCOPIC CHOLECYSTECTOMY WITH INTRAOPERATIVE CHOLANGIOGRAM AND LAPAROSCOPIC COMMON DUCT EXPLORATION 07/08/2022  Mr. Reader is postoperative day 1 from a laparoscopic cholecystectomy with intraoperative cholangiogram and common duct exploration.  Unfortunately we are unable to fully clear the duct.  He returns to the endoscopy suite for repeat ERCP today.  He required a straight cath overnight -monitor and straight cath as needed. Possible discharge after ERCP today depending on findings.  May need to stay in the hospital till tomorrow.     LOS: 0 days    Felicie Morn, MD  [facility] Use [email or web address] to contact on call provider  Daily Billing: [phone] - post op

## 2022-07-09 NOTE — Anesthesia Procedure Notes (Signed)
Procedure Name: Intubation Date/Time: 07/09/2022 12:41 PM  Performed by: Cynda Familia, CRNAPre-anesthesia Checklist: Patient identified, Emergency Drugs available, Suction available and Patient being monitored Patient Re-evaluated:Patient Re-evaluated prior to induction Oxygen Delivery Method: Circle System Utilized Preoxygenation: Pre-oxygenation with 100% oxygen Induction Type: IV induction and Cricoid Pressure applied Ventilation: Mask ventilation without difficulty Laryngoscope Size: Miller and 2 Grade View: Grade I Tube type: Oral Number of attempts: 1 Airway Equipment and Method: Stylet Placement Confirmation: ETT inserted through vocal cords under direct vision, positive ETCO2 and breath sounds checked- equal and bilateral Secured at: 21 cm Tube secured with: Tape Dental Injury: Teeth and Oropharynx as per pre-operative assessment  Comments: Smooth IV induction Turk-- intubation AM CRNA atraumatic-- teeth and mouth as preop -- bilat BS Turk-- poor dentition-- missing many teeth and teeth chipped

## 2022-07-09 NOTE — Progress Notes (Signed)
Transition of Care [facility]) Screening Note  Patient Details  Name: Douglas Hurst. Date of Birth: Jan 29, 1941  Transition of Care [facility]) CM/SW Contact:    Sherie Don, LCSW Phone Number: 07/09/2022, 10:05 AM  Transition of Care Department [facility]) has reviewed patient and no TOC needs have been identified at this time. We will continue to monitor patient advancement through interdisciplinary progression rounds. If new patient transition needs arise, please place a TOC consult.

## 2022-07-09 NOTE — Op Note (Addendum)
[facility] Patient Name: Douglas Hurst Procedure Date: 07/09/2022 MRN: [id number] Attending MD: Clarene Essex , MD Date of Birth: [birth year] CSN: [id number] Age: 81 Admit Type: Inpatient Procedure:                ERCP Indications:              Bile duct stone(s) Providers:                Clarene Essex, MD, William Dalton, Technician,                            Dulcy Fanny Referring MD:              Medicines:                General Anesthesia Complications:            No immediate complications. Estimated Blood Loss:     Estimated blood loss: none. Procedure:                Pre-Anesthesia Assessment:                           - Prior to the procedure, a History and Physical                            was performed, and patient medications and                            allergies were reviewed. The patient's tolerance of                            previous anesthesia was also reviewed. The risks                            and benefits of the procedure and the sedation                            options and risks were discussed with the patient.                            All questions were answered, and informed consent                            was obtained. Prior Anticoagulants: The patient has                            taken Eliquis (apixaban), last dose was 4 days                            prior to procedure. ASA Grade Assessment: III - A                            patient with severe systemic disease. After                            reviewing the risks  and benefits, the patient was                            deemed in satisfactory condition to undergo the                            procedure.                           After obtaining informed consent, the scope was                            passed under direct vision. Throughout the                            procedure, the patient's blood pressure, pulse, and                            oxygen saturations  were monitored continuously. The                            TJF-Q190V ([id number]) Olympus duodenoscope was                            introduced through the mouth, and used to inject                            contrast into and used to cannulate the bile duct.                            The ERCP was technically difficult and complex due                            to unusual anatomy. Successful completion of the                            procedure was aided by performing the maneuvers                            documented (below) in this report. The patient                            tolerated the procedure well. 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 The major       papilla was normal. We initially tried to cannulate with our regular       sphincterotome but were unsuccessful and although we did not think we       found a different ampulla than where we had thought we also tried to       cannulate through that possible opening but were unsuccessful and we       elected to proceed with A biliary pre-cut sphincterotomy was made with a       needle knife using a freehand technique using ERBE electrocautery. There       was no post-sphincterotomy bleeding. We did see a touch of bile a few       times with a few small cuts but despite using the needle-knife catheter       and the sphincterotome we could not advance the wire into the duct and       we did switch to a smaller sphincterotome and again retried to advance       the wire multiple times without success and we did see the clip that was       previously placed and we again  thought with manipulating the       sphincterotome around the clip and bowing that we were looking at the       ampulla but we did not think any further more aggressive needle-knife       would be worth the risk and we elected to stop the procedure at this       point and the scope was removed and the patient tolerated the procedure       well and there was no obvious wire advancement towards the pancreas nor       any dye injected into the pancreas however one time when we thought we       were in the right position even though the wire would not advance we did       inject a little bit of dye but that just filled the diverticulum Impression:               - The major papilla was located entirely within a                            diverticulum.                           - The major papilla appeared normal.                           - A biliary sphincterotomy was performed using the                            needle-knife without successful cannulation. Moderate Sedation:      Not Applicable - Patient had care per Anesthesia. Recommendation:           - Clear liquid diet for 6 hours. Probably observe                            for  delayed complications overnight and slowly                            advance diet tomorrow                           - Continue present medications.                           - Resume Eliquis (apixaban) at prior dose in 4 days                            if okay with surgical team.                           - Return to GI clinic PRN.                           - Telephone GI clinic if symptomatic PRN.                           - Refer to a gastroenterologist at a university                            like [facility] or [place] at appointment to be                            scheduled. Particularly if further attempts want to                            be done or we can wait for stone complications to                            proceed although that approach could  be risky as                            well Procedure Code(s):        --- Professional ---                           [phone], Endoscopic retrograde                            cholangiopancreatography (ERCP); with                            sphincterotomy/papillotomy Diagnosis Code(s):        --- Professional ---                           K80.50, Calculus of bile duct without cholangitis                            or cholecystitis without obstruction CPT copyright 2019 American Medical Association. All rights reserved. The codes documented in this report are preliminary and upon coder review may  be revised to meet current compliance requirements. Clarene Essex, MD 07/09/2022 2:27:18 PM This report has been signed electronically. Number of Addenda: 0

## 2022-07-09 NOTE — Transfer of Care (Signed)
Immediate Anesthesia Transfer of Care Note  Patient: Douglas Hurst.  Procedure(s) Performed: ENDOSCOPIC RETROGRADE CHOLANGIOPANCREATOGRAPHY (ERCP) FISTULOTOMY  Patient Location: PACU and Endoscopy Unit  Anesthesia Type:GETA  Level of Consciousness: awake  Airway & Oxygen Therapy: Patient Spontanous Breathing and Patient connected to face mask oxygen  Post-op Assessment: Report given to RN and Post -op Vital signs reviewed and stable  Post vital signs: Reviewed and stable  Last Vitals:  Vitals Value Taken Time  BP    Temp    Pulse    Resp    SpO2      Last Pain:  Vitals:   07/09/22 1207  TempSrc: Temporal  PainSc: 0-No pain      Patients Stated Pain Goal: 0 (86/14/83 0735)  Complications: No notable events documented.

## 2022-07-09 NOTE — Anesthesia Preprocedure Evaluation (Addendum)
Anesthesia Evaluation  Patient identified by MRN, date of birth, ID band Patient awake    Reviewed: Allergy & Precautions, NPO status , Patient's Chart, lab work & pertinent test results, reviewed documented beta blocker date and time   Airway Mallampati: I  TM Distance: <3 FB Neck ROM: Full    Dental  (+) Dental Advisory Given, Partial Upper   Pulmonary COPD,  COPD inhaler, former smoker,    Pulmonary exam normal breath sounds clear to auscultation       Cardiovascular hypertension, Pt. on medications and Pt. on home beta blockers Normal cardiovascular exam+ dysrhythmias Atrial Fibrillation  Rhythm:Regular Rate:Normal     Neuro/Psych PSYCHIATRIC DISORDERS Bipolar Disorder  Neuromuscular disease    GI/Hepatic hiatal hernia, GERD  Medicated,CBD stone- post surgery   Endo/Other  diabetes, Type 2, Oral Hypoglycemic Agents  Renal/GU negative Renal ROS     Musculoskeletal negative musculoskeletal ROS (+)   Abdominal   Peds  Hematology  (+) Blood dyscrasia (Eliquis), anemia ,   Anesthesia Other Findings Day of surgery medications reviewed with the patient.  Reproductive/Obstetrics                            Anesthesia Physical Anesthesia Plan  ASA: 2  Anesthesia Plan: General   Post-op Pain Management: Minimal or no pain anticipated   Induction: Intravenous  PONV Risk Score and Plan: 2 and TIVA and Treatment may vary due to age or medical condition  Airway Management Planned: Oral ETT  Additional Equipment:   Intra-op Plan:   Post-operative Plan: Extubation in OR  Informed Consent: I have reviewed the patients History and Physical, chart, labs and discussed the procedure including the risks, benefits and alternatives for the proposed anesthesia with the patient or authorized representative who has indicated his/her understanding and acceptance.     Dental advisory given  Plan  Discussed with: CRNA  Anesthesia Plan Comments:        Anesthesia Quick Evaluation

## 2022-07-09 NOTE — Progress Notes (Signed)
Douglas Hurst. 12:30 PM  Subjective: Patient doing fine from his surgery just sore over the incisions no new complaints and we discussed that the CBD stones and inability to do maintain position with the wire and discussed our ERCP today  Objective: Vital signs stable afebrile no acute distress exam please see preassessment evaluation transaminases up a little white count slight elevation other labs okay  Assessment: CBD stone  Plan: Okay to retry ERCP with anesthesia assistance  [facility]  office [phone] After 5PM or if no answer call [phone]

## 2022-07-09 NOTE — Progress Notes (Signed)
Patient doing fine post procedure and his case discussed with his wife and his brother and they would like a referral as an outpatient to the [facility] for a another try at ERCP and we talked about being proactive versus waiting for a problem and seeing how he does and the other thing they feel strongly about is how he needs rehab or physical therapy and is also having trouble urinating and I have asked my nurse to start to look into the referral process for a university try for ERCP

## 2022-07-09 NOTE — Progress Notes (Signed)
Pt had difficulty voiding. Bladder scan done, showed 420m. In and Out done per order. Pulled out 7085mof urine. Endorsed.

## 2022-07-10 DIAGNOSIS — R339 Retention of urine, unspecified: Secondary | ICD-10-CM | POA: Diagnosis present

## 2022-07-10 DIAGNOSIS — K8050 Calculus of bile duct without cholangitis or cholecystitis without obstruction: Secondary | ICD-10-CM | POA: Diagnosis present

## 2022-07-10 DIAGNOSIS — K8012 Calculus of gallbladder with acute and chronic cholecystitis without obstruction: Secondary | ICD-10-CM | POA: Diagnosis present

## 2022-07-10 DIAGNOSIS — Z8249 Family history of ischemic heart disease and other diseases of the circulatory system: Secondary | ICD-10-CM | POA: Diagnosis not present

## 2022-07-10 DIAGNOSIS — K5710 Diverticulosis of small intestine without perforation or abscess without bleeding: Secondary | ICD-10-CM | POA: Diagnosis present

## 2022-07-10 DIAGNOSIS — R410 Disorientation, unspecified: Secondary | ICD-10-CM | POA: Diagnosis present

## 2022-07-10 DIAGNOSIS — Z7901 Long term (current) use of anticoagulants: Secondary | ICD-10-CM | POA: Diagnosis not present

## 2022-07-10 DIAGNOSIS — F319 Bipolar disorder, unspecified: Secondary | ICD-10-CM | POA: Diagnosis present

## 2022-07-10 DIAGNOSIS — K219 Gastro-esophageal reflux disease without esophagitis: Secondary | ICD-10-CM | POA: Diagnosis present

## 2022-07-10 DIAGNOSIS — Z7984 Long term (current) use of oral hypoglycemic drugs: Secondary | ICD-10-CM | POA: Diagnosis not present

## 2022-07-10 DIAGNOSIS — E782 Mixed hyperlipidemia: Secondary | ICD-10-CM | POA: Diagnosis present

## 2022-07-10 DIAGNOSIS — Z87891 Personal history of nicotine dependence: Secondary | ICD-10-CM | POA: Diagnosis not present

## 2022-07-10 DIAGNOSIS — Z79899 Other long term (current) drug therapy: Secondary | ICD-10-CM | POA: Diagnosis not present

## 2022-07-10 DIAGNOSIS — E1142 Type 2 diabetes mellitus with diabetic polyneuropathy: Secondary | ICD-10-CM | POA: Diagnosis present

## 2022-07-10 DIAGNOSIS — J449 Chronic obstructive pulmonary disease, unspecified: Secondary | ICD-10-CM | POA: Diagnosis present

## 2022-07-10 DIAGNOSIS — I480 Paroxysmal atrial fibrillation: Secondary | ICD-10-CM | POA: Diagnosis present

## 2022-07-10 LAB — CBC
HCT: 38.7 % — ABNORMAL LOW (ref 39.0–52.0)
Hemoglobin: 12.2 g/dL — ABNORMAL LOW (ref 13.0–17.0)
MCH: 29.8 pg (ref 26.0–34.0)
MCHC: 31.5 g/dL (ref 30.0–36.0)
MCV: 94.4 fL (ref 80.0–100.0)
Platelets: 178 10*3/uL (ref 150–400)
RBC: 4.1 MIL/uL — ABNORMAL LOW (ref 4.22–5.81)
RDW: 16.2 % — ABNORMAL HIGH (ref 11.5–15.5)
WBC: 9.2 10*3/uL (ref 4.0–10.5)
nRBC: 0 % (ref 0.0–0.2)

## 2022-07-10 LAB — GLUCOSE, CAPILLARY
Glucose-Capillary: 105 mg/dL — ABNORMAL HIGH (ref 70–99)
Glucose-Capillary: 172 mg/dL — ABNORMAL HIGH (ref 70–99)
Glucose-Capillary: 120 mg/dL — ABNORMAL HIGH (ref 70–99)
Glucose-Capillary: 155 mg/dL — ABNORMAL HIGH (ref 70–99)

## 2022-07-10 LAB — COMPREHENSIVE METABOLIC PANEL
ALT: 262 U/L — ABNORMAL HIGH (ref 0–44)
AST: 148 U/L — ABNORMAL HIGH (ref 15–41)
Albumin: 3 g/dL — ABNORMAL LOW (ref 3.5–5.0)
Alkaline Phosphatase: 80 U/L (ref 38–126)
Anion gap: 9 (ref 5–15)
BUN: 10 mg/dL (ref 8–23)
CO2: 27 mmol/L (ref 22–32)
Calcium: 8.9 mg/dL (ref 8.9–10.3)
Chloride: 108 mmol/L (ref 98–111)
Creatinine, Ser: 0.73 mg/dL (ref 0.61–1.24)
GFR, Estimated: >60 mL/min (ref >60)
Glucose, Bld: 103 mg/dL — ABNORMAL HIGH (ref 70–99)
Potassium: 3.9 mmol/L (ref 3.5–5.1)
Sodium: 144 mmol/L (ref 135–145)
Total Bilirubin: 1 mg/dL (ref 0.3–1.2)
Total Protein: 5.6 g/dL — ABNORMAL LOW (ref 6.5–8.1)

## 2022-07-10 LAB — LIPASE, BLOOD: Lipase: 22 U/L (ref 11–51)

## 2022-07-10 MED ORDER — SODIUM CHLORIDE 0.9 % IV SOLN: INTRAVENOUS | Status: DC

## 2022-07-10 NOTE — Progress Notes (Signed)
Mobility Specialist - Progress Note    07/10/22 1442  Mobility  Activity Ambulated with assistance in hallway  Level of Assistance Minimal assist, patient does 75% or more  Assistive Device Front wheel walker  Distance Ambulated (ft) 40 ft  Activity Response Tolerated well  $Mobility charge 1 Mobility   Pt used log roll technique when exiting bed, no assistance needed. Pt required RW and Min A when standing and to steady while ambulating. Pt walked 40 ft in hallway, required verbal cues, and c/o abdominal pain secondary to surgery. Returned pt to bed at EOS with all necessities in reach and notified RN of session.   [place] Specialist Acute Rehabilitation Services Phone: [phone] 07/10/22, 2:47 PM

## 2022-07-10 NOTE — Progress Notes (Signed)
Douglas Hurst. 10:42 AM  Subjective: Patient without obvious post ERCP problems and his abdomen is still sore from his surgery and no new complaints and his case again discussed with his wife and he is looking forward to working with physical therapy  Objective: Vital signs stable afebrile no acute distress abdomen is sore over the surgical sites decreased transaminases lipase okay BUN and creatinine okay CBC fine  Assessment: Residual CBD stones  Plan: We again discussed the risks of leaving them in and seeing how he does versus the risk of the procedure and my nurse is setting him up for a [facility] ER[facility] and they will have to decide at home whether they want to proceed or not and okay to resume Eliquis in 3 days from my post sphincterotomy yesterday if okay with surgical team from their standpoint and please call my rounding partner this weekend if we could be of any further assistance with this hospital stay and I am happy to see back in the office as needed  [facility]  office [phone] After 5PM or if no answer call [phone]

## 2022-07-10 NOTE — Progress Notes (Signed)
Progress Note: General Surgery Service   Chief Complaint/Subjective: Tolerating diet, some incontinence.  Some delirium likely related to 2 anesthetics.    Objective: Vital signs in last 24 hours: Temp:  [97.6 F (36.4 C)-98.7 F (37.1 C)] 97.8 F (36.6 C) (07/14 1314) Pulse Rate:  [56-82] 60 (07/14 1314) Resp:  [15-20] 18 (07/14 0630) BP: (124-148)/(56-69) 124/60 (07/14 1314) SpO2:  [91 %-97 %] 97 % (07/14 1314) Last BM Date : 07/07/22  Intake/Output from previous day: 07/13 0701 - 07/14 0700 In: 1842.8 [P.O.:400; I.V.:1242.8; IV Piggyback:200] Out: 800 [Urine:650; Drains:150] Intake/Output this shift: Total I/O In: 400 [P.O.:400] Out: 220 [Urine:200; Drains:20]  GI: Abd soft, tender around incisions, JP drain serosanguineous without bile in drain.  Lab Results: CBC  Recent Labs    07/09/22 0627 07/10/22 0459  WBC 14.0* 9.2  HGB 12.9* 12.2*  HCT 40.1 38.7*  PLT 206 178    BMET Recent Labs    07/09/22 0627 07/10/22 0459  NA 139 144  K 4.4 3.9  CL 103 108  CO2 23 27  GLUCOSE 184* 103*  BUN 18 10  CREATININE 1.00 0.73  CALCIUM 9.3 8.9    PT/INR No results for input(s): "LABPROT", "INR" in the last 72 hours. ABG No results for input(s): "PHART", "HCO3" in the last 72 hours.  Invalid input(s): "PCO2", "PO2"  Anti-infectives: Anti-infectives (From admission, onward)    Start     Dose/Rate Route Frequency Ordered Stop   07/08/22 0800  ceFAZolin (ANCEF) IVPB 2g/100 mL premix        2 g 200 mL/hr over 30 Minutes Intravenous On call to O.R. 07/08/22 0751 07/08/22 1049       Medications: Scheduled Meds:  acetaminophen  650 mg Oral Q6H   amiodarone  200 mg Oral Daily   atorvastatin  40 mg Oral QPM   docusate sodium  100 mg Oral BID   gabapentin  300 mg Oral TID   heparin injection (subcutaneous)  5,000 Units Subcutaneous Q8H   insulin aspart  0-5 Units Subcutaneous QHS   insulin aspart  0-9 Units Subcutaneous TID WC   metoprolol tartrate  25 mg  Oral BID   pantoprazole  40 mg Oral q morning   tamsulosin  0.4 mg Oral QPC supper   Continuous Infusions:  lactated ringers 50 mL/hr at 07/08/22 1810   methocarbamol (ROBAXIN) IV     PRN Meds:.HYDROmorphone (DILAUDID) injection, methocarbamol (ROBAXIN) IV, ondansetron (ZOFRAN) IV, oxyCODONE, oxyCODONE, prochlorperazine, simethicone, umeclidinium bromide  Assessment/Plan: s/p Procedure(s): LAPAROSCOPIC CHOLECYSTECTOMY WITH INTRAOPERATIVE CHOLANGIOGRAM AND LAPAROSCOPIC COMMON DUCT EXPLORATION 07/08/2022  Mr. Betten underwent laparoscopic cholecystectomy with intraoperative cholangiogram and common duct exploration on 07/10/22, unfortunately we were unable to pass the wire past the ampula into the duodenum and were unable to fully clear the duct.  He underwent repeat ERCP on 07/09/22, which was unsuccessful.  Dr. Watt Climes recommended referral to an academic center for attempted repeat ERCP.  Unfortunately, his wife no longer feels able to care for him.  He has become deconditioned after two recent hospital stays and multiple procedures and surgery.  I will ask PT and OT to evaluate, as well as social work team, to see if we c can come up with a safe discharge plan.  Thankfully the choledocholithiasis is non-obstructive and he seems to be recovering well from cholecystectomy.  The JP drain will stay in place for 10-14 days.  I can remove it in office.  Urinary retention - straight cath as needed  Continue  home meds.  Restart xarelto 3 days after ERCP per Dr. Watt Climes.   Felicie Morn, MD General, Bariatric and Minimally Invasive Surgery [facility] [place]   Daily Billing: [phone] - post op

## 2022-07-11 ENCOUNTER — Inpatient Hospital Stay (HOSPITAL_COMMUNITY): Payer: Medicare HMO

## 2022-07-11 LAB — HEPATIC FUNCTION PANEL
ALT: 185 U/L — ABNORMAL HIGH (ref 0–44)
AST: 70 U/L — ABNORMAL HIGH (ref 15–41)
Albumin: 2.9 g/dL — ABNORMAL LOW (ref 3.5–5.0)
Alkaline Phosphatase: 75 U/L (ref 38–126)
Bilirubin, Direct: 0.3 mg/dL — ABNORMAL HIGH (ref 0.0–0.2)
Indirect Bilirubin: 0.4 mg/dL (ref 0.3–0.9)
Total Bilirubin: 0.7 mg/dL (ref 0.3–1.2)
Total Protein: 5.6 g/dL — ABNORMAL LOW (ref 6.5–8.1)

## 2022-07-11 LAB — CBC
HCT: 37.5 % — ABNORMAL LOW (ref 39.0–52.0)
Hemoglobin: 12 g/dL — ABNORMAL LOW (ref 13.0–17.0)
MCH: 30.5 pg (ref 26.0–34.0)
MCHC: 32 g/dL (ref 30.0–36.0)
MCV: 95.2 fL (ref 80.0–100.0)
Platelets: 171 10*3/uL (ref 150–400)
RBC: 3.94 MIL/uL — ABNORMAL LOW (ref 4.22–5.81)
RDW: 16.1 % — ABNORMAL HIGH (ref 11.5–15.5)
WBC: 8.6 10*3/uL (ref 4.0–10.5)
nRBC: 0 % (ref 0.0–0.2)

## 2022-07-11 LAB — GLUCOSE, CAPILLARY
Glucose-Capillary: 117 mg/dL — ABNORMAL HIGH (ref 70–99)
Glucose-Capillary: 136 mg/dL — ABNORMAL HIGH (ref 70–99)
Glucose-Capillary: 117 mg/dL — ABNORMAL HIGH (ref 70–99)
Glucose-Capillary: 132 mg/dL — ABNORMAL HIGH (ref 70–99)

## 2022-07-11 LAB — LIPASE, BLOOD: Lipase: 23 U/L (ref 11–51)

## 2022-07-11 MED ORDER — PHENOL 1.4 % MT LIQD: 1.0000 | OROMUCOSAL | Status: DC | PRN

## 2022-07-11 MED ORDER — MENTHOL 3 MG MT LOZG
1.0000 | LOZENGE | OROMUCOSAL | Status: DC | PRN
Start: 2022-07-11 — End: 2022-07-14
  Administered 2022-07-11 (×2): 3 mg via ORAL
  Filled 2022-07-11 (×2): qty 9

## 2022-07-11 NOTE — Plan of Care (Signed)

## 2022-07-11 NOTE — Anesthesia Postprocedure Evaluation (Signed)
Anesthesia Post Note  Patient: Douglas Hurst.  Procedure(s) Performed: ENDOSCOPIC RETROGRADE CHOLANGIOPANCREATOGRAPHY (ERCP) FISTULOTOMY     Patient location during evaluation: PACU Anesthesia Type: General Level of consciousness: awake and alert Pain management: pain level controlled Vital Signs Assessment: post-procedure vital signs reviewed and stable Respiratory status: spontaneous breathing, nonlabored ventilation, respiratory function stable and patient connected to nasal cannula oxygen Cardiovascular status: blood pressure returned to baseline and stable Postop Assessment: no apparent nausea or vomiting Anesthetic complications: no   No notable events documented.  Last Vitals:  Vitals:   07/10/22 2041 07/11/22 0602  BP: 134/64 (!) 156/76  Pulse: 64 70  Resp: 19 18  Temp: (!) 36.4 C (!) 36.3 C  SpO2: 95% 96%    Last Pain:  Vitals:   07/11/22 0900  TempSrc:   PainSc: 2                  Santa Lighter

## 2022-07-11 NOTE — Progress Notes (Addendum)
Pt complaining of sore throat and "unable to swallow" this am. Watched pt drink water several times, noted zero cough/choking, no resp distress noted, MD made aware and Chloraseptic lozenges given per orders. This afternoon pt cont to c/o of "difficulty swallowing" and pain in his upper chest region. Pt unable to describe what type of pain, simply says "I have a hiatal hernia." Pt spitting up large amts of frothy sputum, pink tinged (pt had just drank cranberry juice). VSS, skin w/d, lungs ess clear/diminished, no coughing or resp distress. MD notified of above and orders received.

## 2022-07-11 NOTE — Progress Notes (Signed)
2 Days Post-Op   Subjective/Chief Complaint: Sore throat otherwise unchanged   Objective: Vital signs in last 24 hours: Temp:  [97.4 F (36.3 C)-97.8 F (36.6 C)] 97.4 F (36.3 C) (07/15 0602) Pulse Rate:  [60-70] 70 (07/15 0602) Resp:  [18-19] 18 (07/15 0602) BP: (124-156)/(60-76) 156/76 (07/15 0602) SpO2:  [95 %-97 %] 96 % (07/15 0602) Last BM Date : 07/07/22  Intake/Output from previous day: 07/14 0701 - 07/15 0700 In: 1801.7 [P.O.:1240; I.V.:561.7] Out: 1375 [Urine:1300; Drains:75] Intake/Output this shift: Total I/O In: 240 [P.O.:240] Out: -   Ab soft nontender, incisoins clean, drain serous  Lab Results:  Recent Labs    07/10/22 0459 07/11/22 0452  WBC 9.2 8.6  HGB 12.2* 12.0*  HCT 38.7* 37.5*  PLT 178 171   BMET Recent Labs    07/09/22 0627 07/10/22 0459  NA 139 144  K 4.4 3.9  CL 103 108  CO2 23 27  GLUCOSE 184* 103*  BUN 18 10  CREATININE 1.00 0.73  CALCIUM 9.3 8.9   PT/INR No results for input(s): "LABPROT", "INR" in the last 72 hours. ABG No results for input(s): "PHART", "HCO3" in the last 72 hours.  Invalid input(s): "PCO2", "PO2"  Studies/Results: DG ERCP  Result Date: 07/09/2022 CLINICAL DATA:  History of choledocholithiasis. EXAM: ERCP COMPARISON:  ERCP 06/23/2022. Intraoperative cholangiogram, 07/08/2022 FLUOROSCOPY: Exposure Index (as provided by the fluoroscopic device): 110.5 mGy Kerma FINDINGS: Single, limited oblique planar images of the RIGHT upper quadrant obtained C-arm. Image demonstrating flexible endoscopy. Additional sequences not provided. IMPRESSION: Fluoroscopic imaging for ERCP. For complete description of intra procedural findings, please see performing service dictation. Electronically Signed   By: Michaelle Birks M.D.   On: 07/09/2022 16:03    Anti-infectives: Anti-infectives (From admission, onward)    Start     Dose/Rate Route Frequency Ordered Stop   07/08/22 0800  ceFAZolin (ANCEF) IVPB 2g/100 mL premix         2 g 200 mL/hr over 30 Minutes Intravenous On call to O.R. 07/08/22 0751 07/08/22 1049       Assessment/Plan: POD 3 lap chole Choledocholithiasis unable to be cleared with ercp -regular diet -chloraseptic for throat -follow labs -needs gi follow up at higher level of care -dispo pending -restart doac 7/16   Rolm Bookbinder 07/11/2022

## 2022-07-11 NOTE — Evaluation (Signed)
Physical Therapy Evaluation Patient Details Name: Douglas Hurst. MRN: [id number] DOB: [birth year] Today's Date: 07/11/2022  History of Present Illness  81 y.o. y/o male with a h/o dismal atrial fibrillation, AAA, aortic dilation, and chronic diastolic heart failure with a recent history of acute calculus choleycystitis now s/p LAPAROSCOPIC CHOLECYSTECTOMY WITH INTRAOPERATIVE CHOLANGIOGRAM AND LAPAROSCOPIC COMMON DUCT EXPLORATION on 07/08/2022  Clinical Impression  On eval, pt required Min A for mobility. He walked ~65 feet with a RW. Pt presents with general weakness, decreased activity tolerance, and impaired gait and balance. Wife was present during session-she reports pt has required increased physical assistance, most recently, and she has had difficulty providing the current level of care he has required. At this time, PT recommendation is for ST SNF rehab. Will plan to follow pt during this hospital stay.        Recommendations for follow up therapy are one component of a multi-disciplinary discharge planning process, led by the attending physician.  Recommendations may be updated based on patient status, additional functional criteria and insurance authorization.  Follow Up Recommendations Skilled nursing-short term rehab (<3 hours/day)      Assistance Recommended at Discharge Frequent or constant Supervision/Assistance  Patient can return home with the following  A little help with walking and/or transfers;A little help with bathing/dressing/bathroom;Assistance with cooking/housework;Assist for transportation;Help with stairs or ramp for entrance    Equipment Recommendations None recommended by PT  Recommendations for Other Services  OT consult    Functional Status Assessment Patient has had a recent decline in their functional status and demonstrates the ability to make significant improvements in function in a reasonable and predictable amount of time.     Precautions /  Restrictions Precautions Precautions: Fall Precaution Comments: JP drain Restrictions Weight Bearing Restrictions: No      Mobility  Bed Mobility Overal bed mobility: Needs Assistance Bed Mobility: Supine to Sit, Sit to Supine     Supine to sit: Min guard, HOB elevated Sit to supine: Min guard, HOB elevated   General bed mobility comments: Increased time. Cues provided.    Transfers Overall transfer level: Needs assistance   Transfers: Sit to/from Stand Sit to Stand: Min guard           General transfer comment: Min guard for safety. Cues for hand placement.    Ambulation/Gait Ambulation/Gait assistance: Min assist Gait Distance (Feet): 65 Feet Assistive device: Rolling walker (2 wheels) Gait Pattern/deviations: Decreased step length - right, Decreased stride length, Decreased step length - left       General Gait Details: Cues for safety, RW  proximity, and for pt to increase step lengths. Pt tends to revert back to short steps (near shuffle at times). Unsteady. Fall risk.  Stairs            Wheelchair Mobility    Modified Rankin (Stroke Patients Only)       Balance Overall balance assessment: Mild deficits observed, not formally tested                                           Pertinent Vitals/Pain Pain Assessment Pain Assessment: No/denies pain    Home Living Family/patient expects to be discharged to:: Private residence Living Arrangements: Spouse/significant other Available Help at Discharge: Family;Available 24 hours/day Type of Home: House Home Access: Stairs to enter Entrance Stairs-Rails: None Entrance Stairs-Number of Steps: 3  Home Layout: One level Home Equipment: Conservation officer, nature (2 wheels);Wheelchair - manual;BSC/3in1      Prior Function Prior Level of Function : Needs assist             Mobility Comments: Reports he has been using walker as needed. Per RN wife states he has been increasingly  unsteady ADLs Comments: Needing increased assistance with bathing and toileting per wife     Hand Dominance   Dominant Hand: Right    Extremity/Trunk Assessment   Upper Extremity Assessment Upper Extremity Assessment: Defer to OT evaluation    Lower Extremity Assessment Lower Extremity Assessment: Generalized weakness    Cervical / Trunk Assessment Cervical / Trunk Assessment: Normal  Communication   Communication: No difficulties  Cognition Arousal/Alertness: Awake/alert Behavior During Therapy: WFL for tasks assessed/performed Overall Cognitive Status: Within Functional Limits for tasks assessed                                 General Comments: Has some short term memory deficits but alert to self, hospital and grossly to situation.        General Comments      Exercises     Assessment/Plan    PT Assessment Patient needs continued PT services  PT Problem List Decreased strength;Decreased balance;Decreased activity tolerance;Decreased mobility;Decreased knowledge of use of DME       PT Treatment Interventions DME instruction;Gait training;Functional mobility training;Therapeutic activities;Balance training;Patient/family education;Therapeutic exercise    PT Goals (Current goals can be found in the Care Plan section)  Acute Rehab PT Goals Patient Stated Goal: to get stronger PT Goal Formulation: With patient/family Time For Goal Achievement: 07/25/22 Potential to Achieve Goals: Good    Frequency Min 2X/week     Co-evaluation               AM-PAC PT "6 Clicks" Mobility  Outcome Measure Help needed turning from your back to your side while in a flat bed without using bedrails?: A Little Help needed moving from lying on your back to sitting on the side of a flat bed without using bedrails?: A Little Help needed moving to and from a bed to a chair (including a wheelchair)?: A Little Help needed standing up from a chair using your arms  (e.g., wheelchair or bedside chair)?: A Little Help needed to walk in hospital room?: A Little Help needed climbing 3-5 steps with a railing? : A Little 6 Click Score: 18    End of Session Equipment Utilized During Treatment: Gait belt Activity Tolerance: Patient tolerated treatment well Patient left: in bed;with call bell/phone within reach;with family/visitor present;with bed alarm set;with nursing/sitter in room Nurse Communication:  (asked NT to help with Primo fit-came off) PT Visit Diagnosis: Muscle weakness (generalized) (M62.81);Difficulty in walking, not elsewhere classified (R26.2)    Time: 7371-0626 PT Time Calculation (min) (ACUTE ONLY): 22 min   Charges:   PT Evaluation $PT Eval Moderate Complexity: 1 Mod            Doreatha Massed, PT Acute Rehabilitation  Office: [phone] Pager: [phone]

## 2022-07-11 NOTE — Evaluation (Signed)
Occupational Therapy Evaluation Patient Details Name: Douglas Hurst. MRN: [id number] DOB: [birth year] Today's Date: 07/11/2022   History of Present Illness 81 y.o. y/o male with a h/o dismal atrial fibrillation, AAA, aortic dilation, and chronic diastolic heart failure with a recent history of acute calculus choleycystitis now s/p LAPAROSCOPIC CHOLECYSTECTOMY WITH INTRAOPERATIVE CHOLANGIOGRAM AND LAPAROSCOPIC COMMON DUCT EXPLORATION on 07/08/2022   Clinical Impression   Douglas Hurst is a 81 year old man who presents with generalized weakness, decreased activity tolerance, impaired balance and pain s/p abdominal surgery. On evaluation he needs min guard to min assist for ADLs. He requires the use of a walker and is mildly unsteady. He needs verbal cues on how to use walker effectively and safe hand placement during ambulation and transfers. He reports he was stronger and more independent a month ago and wants to be stronger. Rn reports wife has been having to help him with ADLs and he used to be independence. Patient will benefit from skilled OT services while in hospital to improve deficits and learn compensatory strategies as needed in order to return to PLOF.       Recommendations for follow up therapy are one component of a multi-disciplinary discharge planning process, led by the attending physician.  Recommendations may be updated based on patient status, additional functional criteria and insurance authorization.   Follow Up Recommendations  Skilled nursing-short term rehab (<3 hours/day)    Assistance Recommended at Discharge Frequent or constant Supervision/Assistance  Patient can return home with the following A little help with walking and/or transfers;A little help with bathing/dressing/bathroom;Assistance with cooking/housework;Help with stairs or ramp for entrance;Direct supervision/assist for medications management;Direct supervision/assist for financial management     Functional Status Assessment  Patient has had a recent decline in their functional status and demonstrates the ability to make significant improvements in function in a reasonable and predictable amount of time.  Equipment Recommendations  None recommended by OT    Recommendations for Other Services       Precautions / Restrictions Precautions Precaution Comments: JP drain Restrictions Weight Bearing Restrictions: No      Mobility Bed Mobility Overal bed mobility: Needs Assistance Bed Mobility: Supine to Sit     Supine to sit: Supervision     General bed mobility comments: Increased time to transfer in to sitting.    Transfers Overall transfer level: Needs assistance Equipment used: Rolling walker (2 wheels)               General transfer comment: Use of RW to ambualte in room with min guard.      Balance Overall balance assessment: Mild deficits observed, not formally tested                                         ADL either performed or assessed with clinical judgement   ADL Overall ADL's : Needs assistance/impaired Eating/Feeding: Independent   Grooming: Min guard;Standing Grooming Details (indicate cue type and reason): at sink to wash hands Upper Body Bathing: Set up;Sitting;Supervision/ safety Upper Body Bathing Details (indicate cue type and reason): washed underneath his arms Lower Body Bathing: Min guard;Sit to/from stand;Supervison/ safety;Set up   Upper Body Dressing : Set up;Sitting   Lower Body Dressing: Supervision/safety;Set up;Sit to/from stand Lower Body Dressing Details (indicate cue type and reason): able to don socks, able to manage hospital gown with sit to stand Toilet  Transfer: Min guard;Grab bars;Regular Toilet;Rolling walker (2 wheels) Toilet Transfer Details (indicate cue type and reason): Increased time to power up but able to to perform with grab bar Toileting- Clothing Manipulation and Hygiene: Sit to/from  stand;Minimal assistance       Functional mobility during ADLs: Min guard;Rolling walker (2 wheels) General ADL Comments: Min guard to ambualte in room with RW. Mildly unsteady but no overt loss of balance. Able to perform most ADLs with setup and supervision - needing cues. He has some reports of soreness/pain with sit to stand and bending over. He reports generalized weakness compared to a month ago.     Vision Patient Visual Report: No change from baseline       Perception     Praxis      Pertinent Vitals/Pain Pain Assessment Pain Assessment: PAINAD Breathing: normal Negative Vocalization: occasional moan/groan, low speech, negative/disapproving quality Facial Expression: sad, frightened, frown Body Language: relaxed Consolability: no need to console PAINAD Score: 2 Pain Intervention(s): Monitored during session     Hand Dominance Right   Extremity/Trunk Assessment Upper Extremity Assessment Upper Extremity Assessment: RUE deficits/detail;LUE deficits/detail RUE Deficits / Details: WFL ROM and 5/5 strength RUE Sensation: WNL RUE Coordination: WNL LUE Deficits / Details: WFl ROM and 5/5 strength LUE Sensation: WNL LUE Coordination: WNL   Lower Extremity Assessment Lower Extremity Assessment: Defer to PT evaluation   Cervical / Trunk Assessment Cervical / Trunk Assessment: Normal   Communication Communication Communication: No difficulties   Cognition Arousal/Alertness: Awake/alert Behavior During Therapy: WFL for tasks assessed/performed Overall Cognitive Status: Within Functional Limits for tasks assessed                                 General Comments: Has some short term memory deficits but alert to self, hospital and grossly to situation.     General Comments       Exercises     Shoulder Instructions      Home Living Family/patient expects to be discharged to:: Unsure Living Arrangements: Spouse/significant other                                       Prior Functioning/Environment Prior Level of Function : Needs assist             Mobility Comments: Reports he has been using walker as needed. Per Rn wife states he has been increasingly unsteady ADLs Comments: Needing increased assistance with bathing and toileting per wife        OT Problem List: Decreased strength;Decreased activity tolerance;Impaired balance (sitting and/or standing);Decreased cognition;Decreased knowledge of use of DME or AE;Pain      OT Treatment/Interventions: Self-care/ADL training;Therapeutic exercise;DME and/or AE instruction;Therapeutic activities;Balance training;Patient/family education    OT Goals(Current goals can be found in the care plan section) Acute Rehab OT Goals Patient Stated Goal: to get stronger OT Goal Formulation: With patient Time For Goal Achievement: 07/25/22 Potential to Achieve Goals: Good  OT Frequency: Min 2X/week    Co-evaluation              AM-PAC OT "6 Clicks" Daily Activity     Outcome Measure Help from another person eating meals?: None Help from another person taking care of personal grooming?: A Little Help from another person toileting, which includes using toliet, bedpan, or urinal?: A Little Help from another  person bathing (including washing, rinsing, drying)?: A Little Help from another person to put on and taking off regular upper body clothing?: A Little Help from another person to put on and taking off regular lower body clothing?: A Little 6 Click Score: 19   End of Session Equipment Utilized During Treatment: Rolling walker (2 wheels) Nurse Communication:  (no chair alarm box in room)  Activity Tolerance: Patient tolerated treatment well Patient left: in chair;with call bell/phone within reach  OT Visit Diagnosis: Unsteadiness on feet (R26.81);Muscle weakness (generalized) (M62.81)                Time: 8110-3159 OT Time Calculation (min): 19 min Charges:   OT General Charges $OT Visit: 1 Visit OT Evaluation $OT Eval Low Complexity: 1 Low  Fedor Kazmierski, OTR/L [place]  Office [phone] Pager: [place] 07/11/2022, 11:12 AM

## 2022-07-12 LAB — HEPATIC FUNCTION PANEL
ALT: 142 U/L — ABNORMAL HIGH (ref 0–44)
AST: 42 U/L — ABNORMAL HIGH (ref 15–41)
Albumin: 2.9 g/dL — ABNORMAL LOW (ref 3.5–5.0)
Alkaline Phosphatase: 81 U/L (ref 38–126)
Bilirubin, Direct: 0.3 mg/dL — ABNORMAL HIGH (ref 0.0–0.2)
Indirect Bilirubin: 0.8 mg/dL (ref 0.3–0.9)
Total Bilirubin: 1.1 mg/dL (ref 0.3–1.2)
Total Protein: 5.7 g/dL — ABNORMAL LOW (ref 6.5–8.1)

## 2022-07-12 LAB — CBC
HCT: 38.1 % — ABNORMAL LOW (ref 39.0–52.0)
Hemoglobin: 12.2 g/dL — ABNORMAL LOW (ref 13.0–17.0)
MCH: 30.3 pg (ref 26.0–34.0)
MCHC: 32 g/dL (ref 30.0–36.0)
MCV: 94.8 fL (ref 80.0–100.0)
Platelets: 177 10*3/uL (ref 150–400)
RBC: 4.02 MIL/uL — ABNORMAL LOW (ref 4.22–5.81)
RDW: 15.9 % — ABNORMAL HIGH (ref 11.5–15.5)
WBC: 7.5 10*3/uL (ref 4.0–10.5)
nRBC: 0 % (ref 0.0–0.2)

## 2022-07-12 LAB — GLUCOSE, CAPILLARY
Glucose-Capillary: 101 mg/dL — ABNORMAL HIGH (ref 70–99)
Glucose-Capillary: 185 mg/dL — ABNORMAL HIGH (ref 70–99)
Glucose-Capillary: 111 mg/dL — ABNORMAL HIGH (ref 70–99)
Glucose-Capillary: 138 mg/dL — ABNORMAL HIGH (ref 70–99)

## 2022-07-12 MED ORDER — APIXABAN 5 MG PO TABS
5.0000 mg | ORAL_TABLET | Freq: Two times a day (BID) | ORAL | Status: DC
  Administered 2022-07-12 – 2022-07-14 (×5): 5 mg via ORAL
  Filled 2022-07-12 (×5): qty 1

## 2022-07-12 MED ORDER — ALUM & MAG HYDROXIDE-SIMETH 200-200-20 MG/5ML PO SUSP: 30.0000 mL | Freq: Four times a day (QID) | ORAL | Status: DC | PRN

## 2022-07-12 NOTE — Progress Notes (Signed)
3 Days Post-Op   Subjective/Chief Complaint: Some cp yesterday, neg ekg and cxr, this is related to this throat hurting and worse with eating, otherwise no complaints   Objective: Vital signs in last 24 hours: Temp:  [97.6 F (36.4 C)-98.5 F (36.9 C)] 97.7 F (36.5 C) (07/16 0500) Pulse Rate:  [58-63] 63 (07/16 0500) Resp:  [15-20] 15 (07/16 0500) BP: (142-160)/(72-86) 149/77 (07/16 0500) SpO2:  [96 %-97 %] 96 % (07/16 0500) Last BM Date : 07/07/22  Intake/Output from previous day: 07/15 0701 - 07/16 0700 In: 665.3 [P.O.:390; I.V.:275.3] Out: 1295 [Urine:1250; Drains:45] Intake/Output this shift: No intake/output data recorded.  Ab soft drain serous, nontender incisions clean   Lab Results:  Recent Labs    07/11/22 0452 07/12/22 0411  WBC 8.6 7.5  HGB 12.0* 12.2*  HCT 37.5* 38.1*  PLT 171 177   BMET Recent Labs    07/10/22 0459  NA 144  K 3.9  CL 108  CO2 27  GLUCOSE 103*  BUN 10  CREATININE 0.73  CALCIUM 8.9   PT/INR No results for input(s): "LABPROT", "INR" in the last 72 hours. ABG No results for input(s): "PHART", "HCO3" in the last 72 hours.  Invalid input(s): "PCO2", "PO2"  Studies/Results: DG Chest Port 1 View  Result Date: 07/11/2022 CLINICAL DATA:  Chest pain, productive cough, recent surgery EXAM: PORTABLE CHEST 1 VIEW COMPARISON:  04/25/2022 FINDINGS: Cardiac size is within normal limits. There are no signs of pulmonary edema. There are linear densities in right lower lung field. There is no pleural effusion or pneumothorax. IMPRESSION: Linear densities in right lower lung fields suggest subsegmental atelectasis. There are no signs of pulmonary edema or focal pulmonary consolidation. Electronically Signed   By: Elmer Picker M.D.   On: 07/11/2022 13:44    Anti-infectives: Anti-infectives (From admission, onward)    Start     Dose/Rate Route Frequency Ordered Stop   07/08/22 0800  ceFAZolin (ANCEF) IVPB 2g/100 mL premix        2  g 200 mL/hr over 30 Minutes Intravenous On call to O.R. 07/08/22 0751 07/08/22 1049       Assessment/Plan: POD 4 lap chole Choledocholithiasis unable to be cleared with ercp -regular diet -chloraseptic for throat -follow labs -needs gi follow up at higher level of care -dispo pending -restart doac today    Rolm Bookbinder 07/12/2022

## 2022-07-13 LAB — CBC
HCT: 36.8 % — ABNORMAL LOW (ref 39.0–52.0)
Hemoglobin: 11.9 g/dL — ABNORMAL LOW (ref 13.0–17.0)
MCH: 30.5 pg (ref 26.0–34.0)
MCHC: 32.3 g/dL (ref 30.0–36.0)
MCV: 94.4 fL (ref 80.0–100.0)
Platelets: 200 10*3/uL (ref 150–400)
RBC: 3.9 MIL/uL — ABNORMAL LOW (ref 4.22–5.81)
RDW: 15.8 % — ABNORMAL HIGH (ref 11.5–15.5)
WBC: 6.7 10*3/uL (ref 4.0–10.5)
nRBC: 0 % (ref 0.0–0.2)

## 2022-07-13 LAB — HEPATIC FUNCTION PANEL
ALT: 101 U/L — ABNORMAL HIGH (ref 0–44)
AST: 26 U/L (ref 15–41)
Albumin: 2.8 g/dL — ABNORMAL LOW (ref 3.5–5.0)
Alkaline Phosphatase: 76 U/L (ref 38–126)
Bilirubin, Direct: 0.3 mg/dL — ABNORMAL HIGH (ref 0.0–0.2)
Indirect Bilirubin: 0.5 mg/dL (ref 0.3–0.9)
Total Bilirubin: 0.8 mg/dL (ref 0.3–1.2)
Total Protein: 5.8 g/dL — ABNORMAL LOW (ref 6.5–8.1)

## 2022-07-13 LAB — GLUCOSE, CAPILLARY
Glucose-Capillary: 111 mg/dL — ABNORMAL HIGH (ref 70–99)
Glucose-Capillary: 221 mg/dL — ABNORMAL HIGH (ref 70–99)
Glucose-Capillary: 81 mg/dL (ref 70–99)
Glucose-Capillary: 119 mg/dL — ABNORMAL HIGH (ref 70–99)

## 2022-07-13 MED ORDER — POLYETHYLENE GLYCOL 3350 17 G PO PACK
17.0000 g | PACK | Freq: Every day | ORAL | Status: DC
  Administered 2022-07-13 – 2022-07-14 (×2): 17 g via ORAL
  Filled 2022-07-13 (×2): qty 1

## 2022-07-13 NOTE — Progress Notes (Signed)
Physical Therapy Treatment Patient Details Name: Douglas Hurst. MRN: [id number] DOB: 09/08/1941 Today's Date: 07/13/2022   History of Present Illness 81 y.o. y/o male with a h/o dismal atrial fibrillation, AAA, aortic dilation, and chronic diastolic heart failure with a recent history of acute calculus choleycystitis now s/p LAPAROSCOPIC CHOLECYSTECTOMY WITH INTRAOPERATIVE CHOLANGIOGRAM AND LAPAROSCOPIC COMMON DUCT EXPLORATION on 07/08/2022    PT Comments    Pt continues to participate well. Tolerated increased distance and exercises on today. Plan is for ST rehab at SNF. We will continue to follow pt during this hospital stay.    Recommendations for follow up therapy are one component of a multi-disciplinary discharge planning process, led by the attending physician.  Recommendations may be updated based on patient status, additional functional criteria and insurance authorization.  Follow Up Recommendations  Skilled nursing-short term rehab (<3 hours/day)     Assistance Recommended at Discharge Frequent or constant Supervision/Assistance  Patient can return home with the following A little help with walking and/or transfers;A little help with bathing/dressing/bathroom;Assistance with cooking/housework;Assist for transportation;Help with stairs or ramp for entrance   Equipment Recommendations  None recommended by PT    Recommendations for Other Services OT consult     Precautions / Restrictions Precautions Precautions: Fall Precaution Comments: JP drain Restrictions Weight Bearing Restrictions: No     Mobility  Bed Mobility Overal bed mobility: Needs Assistance Bed Mobility: Supine to Sit     Supine to sit: Supervision, HOB elevated     General bed mobility comments: Increased time. Cues provided.    Transfers Overall transfer level: Needs assistance Equipment used: Rolling walker (2 wheels) Transfers: Sit to/from Stand Sit to Stand: Min guard, From elevated  surface           General transfer comment: Min guard for safety. Cues for hand placement.    Ambulation/Gait Ambulation/Gait assistance: Min assist Gait Distance (Feet): 70 Feet Assistive device: Rolling walker (2 wheels) Gait Pattern/deviations: Step-through pattern, Decreased stride length, Decreased step length - right, Decreased step length - left, Trunk flexed       General Gait Details: Cues for safety, proper use of RW, RW  proximity, and for pt to increase step lengths. Pt tends to revert back to short steps (near shuffle at times). Unsteady. Fall risk.   Stairs             Wheelchair Mobility    Modified Rankin (Stroke Patients Only)       Balance Overall balance assessment: Needs assistance         Standing balance support: Bilateral upper extremity supported, During functional activity, Reliant on assistive device for balance Standing balance-Leahy Scale: Poor                              Cognition Arousal/Alertness: Awake/alert Behavior During Therapy: WFL for tasks assessed/performed Overall Cognitive Status: Within Functional Limits for tasks assessed                                          Exercises General Exercises - Lower Extremity Heel Slides: AROM, Both, 15 reps, Seated Other Exercises Other Exercises: sit to stands  x 7 reps    General Comments        Pertinent Vitals/Pain Pain Assessment Pain Assessment: Faces Faces Pain Scale: Hurts little more Pain Location: drain  site Pain Descriptors / Indicators: Discomfort, Sore Pain Intervention(s): Monitored during session    Home Living                          Prior Function            PT Goals (current goals can now be found in the care plan section) Progress towards PT goals: Progressing toward goals    Frequency    Min 2X/week      PT Plan Current plan remains appropriate    Co-evaluation              AM-PAC PT  "6 Clicks" Mobility   Outcome Measure  Help needed turning from your back to your side while in a flat bed without using bedrails?: A Little Help needed moving from lying on your back to sitting on the side of a flat bed without using bedrails?: A Little Help needed moving to and from a bed to a chair (including a wheelchair)?: A Little Help needed standing up from a chair using your arms (e.g., wheelchair or bedside chair)?: A Little Help needed to walk in hospital room?: A Little Help needed climbing 3-5 steps with a railing? : A Little 6 Click Score: 18    End of Session Equipment Utilized During Treatment: Gait belt Activity Tolerance: Patient tolerated treatment well Patient left: in chair;with call bell/phone within reach;with family/visitor present;with chair alarm set   PT Visit Diagnosis: Muscle weakness (generalized) (M62.81);Difficulty in walking, not elsewhere classified (R26.2)     Time: 9924-2683 PT Time Calculation (min) (ACUTE ONLY): 13 min  Charges:  $Gait Training: 8-22 mins              Doreatha Massed, PT Acute Rehabilitation  Office: [phone] Pager: [phone]

## 2022-07-13 NOTE — TOC PASRR Note (Signed)
[place] Note  Patient Details  Name: Douglas Hurst. Date of Birth: 1941/06/16 [place] MUST ID: [id number]  Transition of Care (TOC) CM/SW Contact:    Sherie Don, LCSW Phone Number: 07/13/2022, 10:53 AM  To Whom It May Concern:  Please be advised that this patient will require a short-term nursing home stay - anticipated 30 days or less for rehabilitation and strengthening. The plan is for return home.

## 2022-07-13 NOTE — Progress Notes (Addendum)
4 Days Post-Op   Subjective/Chief Complaint: Doing well, no complaints   Objective: Vital signs in last 24 hours: Temp:  [97.4 F (36.3 C)-97.7 F (36.5 C)] 97.5 F (36.4 C) (07/17 0509) Pulse Rate:  [57-66] 58 (07/17 0509) Resp:  [14-19] 14 (07/17 0509) BP: (127-178)/(62-82) 156/70 (07/17 0509) SpO2:  [95 %-96 %] 95 % (07/17 0509) Last BM Date : 07/07/22  Intake/Output from previous day: 07/16 0701 - 07/17 0700 In: 840 [P.O.:840] Out: 2195 [Urine:2150; Drains:45] Intake/Output this shift: Total I/O In: 120 [P.O.:120] Out: 310 [Urine:300; Drains:10]  Ab soft drain serous, nontender incisions clean   Lab Results:  Recent Labs    07/12/22 0411 07/13/22 0419  WBC 7.5 6.7  HGB 12.2* 11.9*  HCT 38.1* 36.8*  PLT 177 200    BMET No results for input(s): "NA", "K", "CL", "CO2", "GLUCOSE", "BUN", "CREATININE", "CALCIUM" in the last 72 hours.  PT/INR No results for input(s): "LABPROT", "INR" in the last 72 hours. ABG No results for input(s): "PHART", "HCO3" in the last 72 hours.  Invalid input(s): "PCO2", "PO2"  Studies/Results: DG Chest Port 1 View  Result Date: 07/11/2022 CLINICAL DATA:  Chest pain, productive cough, recent surgery EXAM: PORTABLE CHEST 1 VIEW COMPARISON:  04/25/2022 FINDINGS: Cardiac size is within normal limits. There are no signs of pulmonary edema. There are linear densities in right lower lung field. There is no pleural effusion or pneumothorax. IMPRESSION: Linear densities in right lower lung fields suggest subsegmental atelectasis. There are no signs of pulmonary edema or focal pulmonary consolidation. Electronically Signed   By: Elmer Picker M.D.   On: 07/11/2022 13:44    Anti-infectives: Anti-infectives (From admission, onward)    Start     Dose/Rate Route Frequency Ordered Stop   07/08/22 0800  ceFAZolin (ANCEF) IVPB 2g/100 mL premix        2 g 200 mL/hr over 30 Minutes Intravenous On call to O.R. 07/08/22 0751 07/08/22 1049        Assessment/Plan: POD 5 lap chole Choledocholithiasis unable to be cleared with ercp -regular diet -chloraseptic for throat -needs gi follow up at higher level of care -medically ready for discharge -restart doac today -Miralax    Douglas Hurst Douglas Hurst 07/13/2022

## 2022-07-13 NOTE — NC FL2 (Signed)
[place] LEVEL OF CARE SCREENING TOOL     IDENTIFICATION  Patient Name: Douglas Hurst. Birthdate: [birth year] Sex: male Admission Date (Current Location): 07/08/2022  [facility] and [place] Number:  Herbalist and Address:  [facility],  [place], [place]      Provider Number: [id number]  Attending Physician Name and Address:  Stechschulte, Nickola Major, MD  Relative Name and Phone Number:  Savan Ruta (spouse) Ph: [phone]    Current Level of Care: Hospital Recommended Level of Care: [place] Prior Approval Number:    Date Approved/Denied:   PASRR Number: Pending  Discharge Plan: SNF    Current Diagnoses: Patient Active Problem List   Diagnosis Date Noted   Choledocholithiasis 07/08/2022   Hyperbilirubinemia 04/30/2022   Hypoxia 04/29/2022   Normocytic anemia 03/70/4888   Acute systolic heart failure (HCC)    PAF (paroxysmal atrial fibrillation) (HCC)    Acute calculous cholecystitis 04/26/2022   Abdominal aortic aneurysm ([place]) 04/25/2022   Bipolar disorder ([place]) 04/25/2022   Chronic obstructive pulmonary disease ([place]) 04/25/2022   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[place]) 04/25/2022   Gastroesophageal reflux disease 04/25/2022   Essential hypertension 04/25/2022   Mixed hyperlipidemia 04/25/2022   sepsis secondary to calculus of bile duct (any) with acute cholecystitis 04/25/2022   Sepsis ([place]) 04/25/2022    Orientation RESPIRATION BLADDER Height & Weight     Self, Time, Situation, Place  Normal Incontinent Weight: 182 lb 1.6 oz (82.6 kg) Height:  '6\' 1"'$  (185.4 cm)  BEHAVIORAL SYMPTOMS/MOOD NEUROLOGICAL BOWEL NUTRITION STATUS   (N/A)  (N/A) Continent Diet (Dysphagia 3 diet)  AMBULATORY STATUS COMMUNICATION OF NEEDS Skin   Limited Assist Verbally Surgical wounds, Other (Comment) (Eccyhmosis: right arm)                       Personal Care  Assistance Level of Assistance  Bathing, Feeding, Dressing Bathing Assistance: Limited assistance Feeding assistance: Independent Dressing Assistance: Limited assistance     Functional Limitations Info  Sight, Hearing, Speech Sight Info: Impaired Hearing Info: Adequate Speech Info: Adequate    SPECIAL CARE FACTORS FREQUENCY  PT (By licensed PT), OT (By licensed OT)     PT Frequency: 5x's/week OT Frequency: 5x's/week            Contractures Contractures Info: Not present    Additional Factors Info  Code Status, Allergies, Insulin Sliding Scale, Psychotropic Code Status Info: Full Allergies Info: NKA Psychotropic Info: Compazine Insulin Sliding Scale Info: See discharge summary       Current Medications (07/13/2022):  This is the current hospital active medication list Current Facility-Administered Medications  Medication Dose Route Frequency Provider Last Rate Last Admin   acetaminophen (TYLENOL) tablet 650 mg  650 mg Oral Q6H Clarene Essex, MD   650 mg at 07/13/22 0642   alum & mag hydroxide-simeth (MAALOX/MYLANTA) 200-200-20 MG/5ML suspension 30 mL  30 mL Oral Q6H PRN Rolm Bookbinder, MD       amiodarone (PACERONE) tablet 200 mg  200 mg Oral Daily Clarene Essex, MD   200 mg at 07/12/22 0934   apixaban (ELIQUIS) tablet 5 mg  5 mg Oral BID Rolm Bookbinder, MD   5 mg at 07/12/22 2146   atorvastatin (LIPITOR) tablet 40 mg  40 mg Oral QPM Clarene Essex, MD   40 mg at 07/12/22 1654   docusate sodium (COLACE) capsule 100 mg  100 mg Oral  BID Clarene Essex, MD   100 mg at 07/12/22 2146   gabapentin (NEURONTIN) capsule 300 mg  300 mg Oral TID Clarene Essex, MD   300 mg at 07/12/22 2146   HYDROmorphone (DILAUDID) injection 0.5 mg  0.5 mg Intravenous Q3H PRN Clarene Essex, MD   0.5 mg at 07/09/22 1771   insulin aspart (novoLOG) injection 0-5 Units  0-5 Units Subcutaneous QHS Clarene Essex, MD       insulin aspart (novoLOG) injection 0-9 Units  0-9 Units Subcutaneous TID WC Clarene Essex, MD    2 Units at 07/12/22 1223   menthol-cetylpyridinium (CEPACOL) lozenge 3 mg  1 lozenge Oral PRN Stechschulte, Nickola Major, MD   3 mg at 07/11/22 1046   methocarbamol (ROBAXIN) 500 mg in dextrose 5 % 50 mL IVPB  500 mg Intravenous Q6H PRN Clarene Essex, MD       metoprolol tartrate (LOPRESSOR) tablet 25 mg  25 mg Oral BID Clarene Essex, MD   25 mg at 07/12/22 2146   ondansetron (ZOFRAN) injection 4 mg  4 mg Intravenous Q6H PRN Clarene Essex, MD       oxyCODONE (Oxy IR/ROXICODONE) immediate release tablet 10 mg  10 mg Oral Q4H PRN Clarene Essex, MD   10 mg at 07/12/22 2146   oxyCODONE (Oxy IR/ROXICODONE) immediate release tablet 5 mg  5 mg Oral Q4H PRN Clarene Essex, MD   5 mg at 07/12/22 1043   pantoprazole (PROTONIX) EC tablet 40 mg  40 mg Oral q morning Clarene Essex, MD   40 mg at 07/12/22 0934   phenol (CHLORASEPTIC) mouth spray 1 spray  1 spray Mouth/Throat PRN Rolm Bookbinder, MD       polyethylene glycol (MIRALAX / GLYCOLAX) packet 17 g  17 g Oral Daily Stechschulte, Nickola Major, MD       prochlorperazine (COMPAZINE) injection 10 mg  10 mg Intravenous Q4H PRN Clarene Essex, MD       simethicone (MYLICON) chewable tablet 80 mg  80 mg Oral QID PRN Clarene Essex, MD       tamsulosin (FLOMAX) capsule 0.4 mg  0.4 mg Oral QPC supper Clarene Essex, MD   0.4 mg at 07/12/22 1654   umeclidinium bromide (INCRUSE ELLIPTA) 62.5 MCG/ACT 1 puff  1 puff Inhalation Daily PRN Clarene Essex, MD         Discharge Medications: Please see discharge summary for a list of discharge medications.  Relevant Imaging Results:  Relevant Lab Results:   Additional Information SSN: [id number]  Sherie Don, LCSW

## 2022-07-13 NOTE — TOC Initial Note (Addendum)
Transition of Care (TOC) - Initial/Assessment Note   Patient Details  Name: Douglas Hurst. MRN: [id number] Date of Birth: 1941-08-18  Transition of Care [facility]) CM/SW Contact:    Sherie Don, LCSW Phone Number: 07/13/2022, 10:41 AM  Clinical Narrative: PT and OT evaluations recommended SNF. Patient and wife are agreeable to SNF. FL2 done; PASRR pending as patient will be a level II. Initial referral faxed out. CSW confirmed patient is managed by Bernadene Bell. TOC awaiting bed offers and PASRR number.  AddendumRosalie Gums number received: [phone] E.  Expected Discharge Plan: Skilled Nursing Facility Barriers to Discharge: [place] Rosalie Gums), SNF Pending bed offer, Insurance Authorization  Patient Goals and CMS Choice Patient states their goals for this hospitalization and ongoing recovery are:: Go to short-term rehab CMS Medicare.gov Compare Post Acute Care list provided to:: Patient Choice offered to / list presented to : Patient, Spouse  Expected Discharge Plan and Services Expected Discharge Plan: [place] In-house Referral: Clinical Social Work Post Acute Care Choice: [place] Living arrangements for the past 2 months: [place]           DME Arranged: N/A DME Agency: NA  Prior Living Arrangements/Services Living arrangements for the past 2 months: Single Family Home Lives with:: Spouse Patient language and need for interpreter reviewed:: Yes Do you feel safe going back to the place where you live?: Yes      Need for Family Participation in Patient Care: No (Comment) Care giver support system in place?: Yes (comment) Criminal Activity/Legal Involvement Pertinent to Current Situation/Hospitalization: No - Comment as needed  Activities of Daily Living Home Assistive Devices/Equipment: Cane (specify quad or straight), Walker (specify type) ADL Screening (condition at time of admission) Patient's cognitive ability adequate  to safely complete daily activities?: Yes Is the patient deaf or have difficulty hearing?: No Does the patient have difficulty seeing, even when wearing glasses/contacts?: No Does the patient have difficulty concentrating, remembering, or making decisions?: No Patient able to express need for assistance with ADLs?: Yes Does the patient have difficulty dressing or bathing?: No Independently performs ADLs?: Yes (appropriate for developmental age) Does the patient have difficulty walking or climbing stairs?: No Weakness of Legs: Both Weakness of Arms/Hands: None  Permission Sought/Granted Permission sought to share information with : Facility Art therapist granted to share information with : Yes, Verbal Permission Granted Permission granted to share info w AGENCY: SNFs  Emotional Assessment Attitude/Demeanor/Rapport: Engaged Affect (typically observed): Accepting Orientation: : Oriented to Self, Oriented to Place, Oriented to  Time, Oriented to Situation Alcohol / Substance Use: Not Applicable  Admission diagnosis:  Choledocholithiasis [K80.50] Patient Active Problem List   Diagnosis Date Noted   Choledocholithiasis 07/08/2022   Hyperbilirubinemia 04/30/2022   Hypoxia 04/29/2022   Normocytic anemia 13/24/4010   Acute systolic heart failure (HCC)    PAF (paroxysmal atrial fibrillation) (HCC)    Acute calculous cholecystitis 04/26/2022   Abdominal aortic aneurysm ([place]) 04/25/2022   Bipolar disorder ([place]) 04/25/2022   Chronic obstructive pulmonary disease ([place]) 04/25/2022   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[place]) 04/25/2022   Gastroesophageal reflux disease 04/25/2022   Essential hypertension 04/25/2022   Mixed hyperlipidemia 04/25/2022   sepsis secondary to calculus of bile duct (any) with acute cholecystitis 04/25/2022   Sepsis ([place]) 04/25/2022   PCP:  Lavone Orn, MD Pharmacy:   [id number]- [place], [place] -  [id number]  [place] [address] Phone: [phone] Fax: [phone]  Readmission Risk Interventions     No data to display

## 2022-07-13 NOTE — Care Management Important Message (Signed)
Important Message  Patient Details IM Letter given to the Patient. Name: Douglas Hurst. MRN: [id number] Date of Birth: [birth year]   Medicare Important Message Given:  Yes     Kerin Salen 07/13/2022, 3:47 PM

## 2022-07-14 DIAGNOSIS — I119 Hypertensive heart disease without heart failure: Secondary | ICD-10-CM | POA: Diagnosis not present

## 2022-07-14 DIAGNOSIS — N401 Enlarged prostate with lower urinary tract symptoms: Secondary | ICD-10-CM | POA: Diagnosis not present

## 2022-07-14 DIAGNOSIS — E119 Type 2 diabetes mellitus without complications: Secondary | ICD-10-CM | POA: Diagnosis not present

## 2022-07-14 DIAGNOSIS — K819 Cholecystitis, unspecified: Secondary | ICD-10-CM | POA: Diagnosis not present

## 2022-07-14 DIAGNOSIS — I4891 Unspecified atrial fibrillation: Secondary | ICD-10-CM | POA: Diagnosis not present

## 2022-07-14 DIAGNOSIS — R5383 Other fatigue: Secondary | ICD-10-CM | POA: Diagnosis not present

## 2022-07-14 DIAGNOSIS — R1312 Dysphagia, oropharyngeal phase: Secondary | ICD-10-CM | POA: Diagnosis not present

## 2022-07-14 DIAGNOSIS — J449 Chronic obstructive pulmonary disease, unspecified: Secondary | ICD-10-CM | POA: Diagnosis not present

## 2022-07-14 DIAGNOSIS — R41841 Cognitive communication deficit: Secondary | ICD-10-CM | POA: Diagnosis not present

## 2022-07-14 DIAGNOSIS — E118 Type 2 diabetes mellitus with unspecified complications: Secondary | ICD-10-CM | POA: Diagnosis not present

## 2022-07-14 DIAGNOSIS — R1310 Dysphagia, unspecified: Secondary | ICD-10-CM | POA: Diagnosis not present

## 2022-07-14 DIAGNOSIS — M6281 Muscle weakness (generalized): Secondary | ICD-10-CM | POA: Diagnosis not present

## 2022-07-14 DIAGNOSIS — R531 Weakness: Secondary | ICD-10-CM | POA: Diagnosis not present

## 2022-07-14 DIAGNOSIS — K8050 Calculus of bile duct without cholangitis or cholecystitis without obstruction: Secondary | ICD-10-CM | POA: Diagnosis not present

## 2022-07-14 DIAGNOSIS — M2550 Pain in unspecified joint: Secondary | ICD-10-CM | POA: Diagnosis not present

## 2022-07-14 DIAGNOSIS — R2681 Unsteadiness on feet: Secondary | ICD-10-CM | POA: Diagnosis not present

## 2022-07-14 DIAGNOSIS — Z7984 Long term (current) use of oral hypoglycemic drugs: Secondary | ICD-10-CM | POA: Diagnosis not present

## 2022-07-14 DIAGNOSIS — F0390 Unspecified dementia without behavioral disturbance: Secondary | ICD-10-CM | POA: Diagnosis not present

## 2022-07-14 DIAGNOSIS — Z434 Encounter for attention to other artificial openings of digestive tract: Secondary | ICD-10-CM | POA: Diagnosis not present

## 2022-07-14 DIAGNOSIS — I5022 Chronic systolic (congestive) heart failure: Secondary | ICD-10-CM | POA: Diagnosis not present

## 2022-07-14 DIAGNOSIS — E1142 Type 2 diabetes mellitus with diabetic polyneuropathy: Secondary | ICD-10-CM | POA: Diagnosis not present

## 2022-07-14 DIAGNOSIS — Z7401 Bed confinement status: Secondary | ICD-10-CM | POA: Diagnosis not present

## 2022-07-14 DIAGNOSIS — F419 Anxiety disorder, unspecified: Secondary | ICD-10-CM | POA: Diagnosis not present

## 2022-07-14 LAB — GLUCOSE, CAPILLARY
Glucose-Capillary: 111 mg/dL — ABNORMAL HIGH (ref 70–99)
Glucose-Capillary: 191 mg/dL — ABNORMAL HIGH (ref 70–99)

## 2022-07-14 MED ORDER — PHENOL 1.4 % MT LIQD: 1.0000 | OROMUCOSAL | Status: DC | PRN

## 2022-07-14 MED ORDER — OXYCODONE-ACETAMINOPHEN 5-325 MG PO TABS: 1.0000 | ORAL_TABLET | ORAL | 0 refills | Status: DC | PRN

## 2022-07-14 NOTE — Progress Notes (Signed)
Patient being transferred to [facility], report given to receiving RN

## 2022-07-14 NOTE — Discharge Summary (Signed)
Patient ID: Douglas Hurst [id number] y.o. 1941-12-28  07/08/2022  Discharge date and time: 07/14/2022  Admitting Physician: [place]  Discharge Physician: [place]  Admission Diagnoses: Choledocholithiasis [K80.50] Patient Active Problem List   Diagnosis Date Noted   Choledocholithiasis 07/08/2022   Hyperbilirubinemia 04/30/2022   Hypoxia 04/29/2022   Normocytic anemia 03/47/4259   Acute systolic heart failure (HCC)    PAF (paroxysmal atrial fibrillation) (HCC)    Acute calculous cholecystitis 04/26/2022   Abdominal aortic aneurysm ([place]) 04/25/2022   Bipolar disorder ([place]) 04/25/2022   Chronic obstructive pulmonary disease ([place]) 04/25/2022   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[place]) 04/25/2022   Gastroesophageal reflux disease 04/25/2022   Essential hypertension 04/25/2022   Mixed hyperlipidemia 04/25/2022   sepsis secondary to calculus of bile duct (any) with acute cholecystitis 04/25/2022   Sepsis ([place]) 04/25/2022     Discharge Diagnoses: Choledocholithiasis  Patient Active Problem List   Diagnosis Date Noted   Choledocholithiasis 07/08/2022   Hyperbilirubinemia 04/30/2022   Hypoxia 04/29/2022   Normocytic anemia 56/38/7564   Acute systolic heart failure (HCC)    PAF (paroxysmal atrial fibrillation) ([place])    Acute calculous cholecystitis 04/26/2022   Abdominal aortic aneurysm ([place]) 04/25/2022   Bipolar disorder ([place]) 04/25/2022   Chronic obstructive pulmonary disease ([place]) 04/25/2022   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 presented after percutaneous cholecystostomy tube and failed ERCP for cholecystitis and non-obstructing choledocholithiasis.  He underwent laparoscopic cholecystectomy with laparoscopic common duct exploration which failed to clear the duct.  He underwent repeat ERCP which was unsuccessful.  He recovered and was discharged to rehab.  [facility]  Liver enzymes normal, choledocholiths appear non-obstructing.  Consults: GI  Significant Diagnostic Studies: ERCP, IOC  Treatments: surgery: as above  Disposition: Skilled nursing facility  Patient Instructions:  Allergies as of 07/14/2022   No Known Allergies      Medication List     TAKE these medications    acetaminophen 325 MG tablet Commonly known as: TYLENOL Take 2 tablets (650 mg total) by mouth every 6 (six) hours as needed for mild pain (or Fever >/= 101).   amiodarone 200 MG tablet Commonly known as: PACERONE Take 1 tablet (200 mg total) by mouth daily.   apixaban 5 MG Tabs tablet Commonly known as: ELIQUIS Take 1 tablet (5 mg total) by mouth 2 (two) times daily.   atorvastatin 40 MG tablet Commonly known as: LIPITOR Take 40 mg by mouth every evening.   metFORMIN 500 MG 24 hr tablet Commonly known as: GLUCOPHAGE-XR Take 500-1,000 mg by mouth See admin instructions. Take 2 tablets (1000 mg) in the morning and 1 tablet (500 mg) at night   metoprolol tartrate 25 MG tablet Commonly known as: LOPRESSOR Take 1 tablet (25 mg total) by mouth 2 (two) times daily.   multivitamin tablet Take 1 tablet by mouth daily.   ondansetron 4 MG tablet Commonly known as: ZOFRAN Take 1 tablet (4 mg total) by mouth every 6 (six) hours as needed for nausea.   oxyCODONE-acetaminophen 5-325  MG tablet Commonly known as: Percocet Take 1 tablet by mouth every  4 (four) hours as needed for severe pain.   pantoprazole 40 MG tablet Commonly known as: PROTONIX Take 40 mg by mouth every morning.   polyethylene glycol powder 17 GM/SCOOP powder Commonly known as: GLYCOLAX/MIRALAX Take 17 g by mouth daily. What changed:  when to take this reasons to take this   Senexon-S 8.6-50 MG tablet Generic drug: senna-docusate Take 1 tablet by mouth at bedtime. What changed:  when to take this reasons to take this   Spiriva Respimat 2.5 MCG/ACT Aers Generic drug: Tiotropium Bromide Monohydrate Inhale 2 puffs into the lungs daily as needed (COPD).   tadalafil 20 MG tablet Commonly known as: CIALIS Take 20 mg by mouth every three (3) days as needed for erectile dysfunction.   tamsulosin 0.4 MG Caps capsule Commonly known as: FLOMAX Take 0.4 mg by mouth daily after supper.        Activity: no heavy lifting for 4 weeks Diet: regular diet Wound Care: keep wound clean and dry  Follow-up:  With Dr. Thermon Leyland in 4 weeks.  Signed: Nickola Major [facility], [place]   07/14/2022, 12:48 PM

## 2022-07-14 NOTE — Progress Notes (Deleted)
Patient asked to paged on call provider if she could get more pain medicine besides what she just got because she reports her pain is still there. On call provider Gershon Cull, NP notified and made aware with advised to give her PRN Vicodin and go with PRN IV medicine whenever no new orders given. Patient was informed.

## 2022-07-14 NOTE — Discharge Instructions (Signed)
 CHOLECYSTECTOMY POST OPERATIVE INSTRUCTIONS  Thinking Clearly  The anesthesia may cause you to feel different for 1 or 2 days. Do not drive, drink alcohol, or make any big decisions for at least 2 days.  Nutrition When you wake up, you will be able to drink small amounts of liquid. If you do not feel sick, you can slowly advance your diet to regular foods. Continue to drink lots of fluids, usually about 8 to 10 glasses per day. Eat a high-fiber diet so you don't strain during bowel movements. High-Fiber Foods Foods high in fiber include beans, bran cereals and whole-grain breads, peas, dried fruit (figs, apricots, and dates), raspberries, blackberries, strawberries, sweet corn, broccoli, baked potatoes with skin, plums, pears, apples, greens, and nuts. Activity Slowly increase your activity. Be sure to get up and walk every hour or so to prevent blood clots. No heavy lifting or strenuous activity for 4 weeks following surgery to prevent hernias at your incision sites It is normal to feel tired. You may need more sleep than usual.  Get your rest but make sure to get up and move around frequently to prevent blood clots and pneumonia.  Work and Return to School You can go back to work when you feel well enough. Discuss the timing with your surgeon. You can usually go back to school or work 1 week after an operation. If your work requires heavy lifting or strenuous activity you need to be placed on light duty for 4 weeks following surgery. You can return to gym class, sports or other physical activities 4 weeks after surgery.  Wound Care Always wash your hands before and after touching near your incision site. Do not soak in a bathtub until cleared at your follow up appointment. You may take a shower 24 hours after surgery. A small amount of drainage from the incision is normal. If the drainage is thick and yellow or the site is red, you may have an infection, so call your surgeon. If you  have a drain in one of your incisions, it will be taken out in office when the drainage stops. Steri-Strips will fall off in 7 to 10 days or they will be removed during your first office visit. If you have dermabond glue covering over the incision, allow the glue to flake off on its own. Avoid wearing tight or rough clothing. It may rub your incisions and make it harder for them to heal. Protect the new skin, especially from the sun. The sun can burn and cause darker scarring. Your scar will heal in about 4 to 6 weeks and will become softer and continue to fade over the next year.  The cosmetic appearance of the incisions will improve over the course of the first year after surgery. Sensation around your incision will return in a few weeks or months.  Bowel Movements After intestinal surgery, you may have loose watery stools for several days. If watery diarrhea lasts longer than 3 days, contact your surgeon. Pain medication (narcotics) can cause constipation. Increase the fiber in your diet with high-fiber foods if you are constipated. You can take an over the counter stool softener like Colace to avoid constipation.  Additional over the counter medications can also be used if Colace isn't sufficient (for example, Milk of Magnesia or Miralax).  Pain The amount of pain is different for each person. Some people need only 1 to 3 doses of pain control medication, while others need more. Take alternating doses of tylenol   and ibuprofen around the clock for the first five days following surgery.  This will provide a baseline of pain control and help with inflammation.  Take the narcotic pain medication in addition if needed for severe pain.  Contact Your Surgeon at [phone], if you have: Pain in your right upper abdomen like a gallbladder attack. Pain that will not go away Pain that gets worse A fever of more than 101F (38.3C) Repeated vomiting Swelling, redness, bleeding, or bad-smelling  drainage from your wound site Strong abdominal pain No bowel movement or unable to pass gas for 3 days Watery diarrhea lasting longer than 3 days  Pain Control The goal of pain control is to minimize pain, keep you moving and help you heal. Your surgical team will work with you on your pain plan. Most often a combination of therapies and medications are used to control your pain. You may also be given medication (local anesthetic) at the surgical site. This may help control your pain for several days. Extreme pain puts extra stress on your body at a time when your body needs to focus on healing. Do not wait until your pain has reached a level "10" or is unbearable before telling your doctor or nurse. It is much easier to control pain before it becomes severe. Following a laparoscopic procedure, pain is sometimes felt in the shoulder. This is due to the gas inserted into your abdomen during the procedure. Moving and walking helps to decrease the gas and the right shoulder pain.  Use the guide below for ways to manage your post-operative pain. Learn more by going to facs.org/safepaincontrol.  How Intense Is My Pain Common Therapies to Feel Better       I hardly notice my pain, and it does not interfere with my activities.  I notice my pain and it distracts me, but I can still do activities (sitting up, walking, standing).  Non-Medication Therapies  Ice (in a bag, applied over clothing at the surgical site), elevation, rest, meditation, massage, distraction (music, TV, play) walking and mild exercise Splinting the abdomen with pillows +  Non-Opioid Medications Acetaminophen (Tylenol) Non-steroidal anti-inflammatory drugs (NSAIDS) Aspirin, Ibuprofen (Motrin, Advil) Naproxen (Aleve) Take these as needed, when you feel pain. Both acetaminophen and NSAIDs help to decrease pain and swelling (inflammation).      My pain is hard to ignore and is more noticeable even when I rest.  My  pain interferes with my usual activities.  Non-Medication Therapies  +  Non-Opioid medications  Take on a regular schedule (around-the-clock) instead of as needed. (For example, Tylenol every 6 hours at 9:00 am, 3:00 pm, 9:00 pm, 3:00 am and Motrin every 6 hours at 12:00 am, 6:00 am, 12:00 pm, 6:00 pm)         I am focused on my pain, and I am not doing my daily activities.  I am groaning in pain, and I cannot sleep. I am unable to do anything.  My pain is as bad as it could be, and nothing else matters.  Non-Medication Therapies  +  Around-the-Clock Non-Opioid Medications  +  Short-acting opioids  Opioids should be used with other medications to manage severe pain. Opioids block pain and give a feeling of euphoria (feel high). Addiction, a serious side effect of opioids, is rare with short-term (a few days) use.  Examples of short-acting opioids include: Tramadol (Ultram), Hydrocodone (Norco, Vicodin), Hydromorphone (Dilaudid), Oxycodone (Oxycontin)     The above directions have been adapted from   the American College of Surgeons Surgical Patient Education Program.  Please refer to the ACS website if needed: https://www.facs.org/-/media/files/education/patient-ed/cholesys.ashx.   Jessaca Philippi, MD [facility] [address], [address], [place], [place]  [address] ?  [address] [address], [place], [place]   [address] [phone] ? [phone] ? FAX [phone] Web site: [email or web address]  

## 2022-07-14 NOTE — TOC Transition Note (Signed)
Transition of Care [facility]) - CM/SW Discharge Note  Patient Details  Name: Douglas Hurst. MRN: [id number] Date of Birth: 09-Mar-1941  [facility] CM/SW Contact:  Sherie Don, LCSW Phone Number: 07/14/2022, 12:41 PM  Clinical Narrative: CSW provided wife with bed offers to discuss with family and [facility] was selected. CSW confirmed bed availability with Lorenza Chick in admissions for today pending insurance authorization  CSW completed insurance authorization on the [employer] portal. Plan Josem Kaufmann ID is: [id number]. Reference # is: [id number]. Patient has been approved for 07/14/2022-07/16/2022.  Patient will go to room 1007P and the number for report is [phone]. Discharge summary, discharge orders, and SNF transfer report faxed to facility in hub. Medical necessity form done; PTAR scheduled. Discharge packet completed. CSW notified wife of discharge. RN updated. TOC signing off.  Final next level of care: Skilled Nursing Facility Barriers to Discharge: Barriers Resolved  Patient Goals and CMS Choice Patient states their goals for this hospitalization and ongoing recovery are:: Go to short-term rehab CMS Medicare.gov Compare Post Acute Care list provided to:: Patient Choice offered to / list presented to : Patient, Spouse  Discharge Placement PASRR number recieved: 07/13/22        Patient chooses bed at: [facility] Patient to be transferred to facility by: [facility] Name of family member notified: Bronte Kropf (spouse) Patient and family notified of of transfer: 07/14/22  Discharge Plan and Services In-house Referral: Clinical Social Work Post Acute Care Choice: [place]          DME Arranged: N/A DME Agency: NA  Readmission Risk Interventions     No data to display

## 2022-07-14 NOTE — Progress Notes (Signed)
5 Days Post-Op   Subjective/Chief Complaint: Doing well, no complaints   Objective: Vital signs in last 24 hours: Temp:  [97.6 F (36.4 C)-97.9 F (36.6 C)] 97.6 F (36.4 C) (07/18 0175) Pulse Rate:  [55-62] 62 (07/18 0608) Resp:  [18] 18 (07/18 0608) BP: (136-163)/(68-74) 163/74 (07/18 0608) SpO2:  [94 %-97 %] 96 % (07/18 0608) Last BM Date : 07/07/22  Intake/Output from previous day: 07/17 0701 - 07/18 0700 In: 360 [P.O.:360] Out: 1520 [Urine:1500; Drains:20] Intake/Output this shift: Total I/O In: 240 [P.O.:240] Out: 200 [Urine:200]  Ab soft drain serous, nontender incisions clean, JP serous   Lab Results:  Recent Labs    07/12/22 0411 07/13/22 0419  WBC 7.5 6.7  HGB 12.2* 11.9*  HCT 38.1* 36.8*  PLT 177 200    BMET No results for input(s): "NA", "K", "CL", "CO2", "GLUCOSE", "BUN", "CREATININE", "CALCIUM" in the last 72 hours.  PT/INR No results for input(s): "LABPROT", "INR" in the last 72 hours. ABG No results for input(s): "PHART", "HCO3" in the last 72 hours.  Invalid input(s): "PCO2", "PO2"  Studies/Results: No results found.  Anti-infectives: Anti-infectives (From admission, onward)    Start     Dose/Rate Route Frequency Ordered Stop   07/08/22 0800  ceFAZolin (ANCEF) IVPB 2g/100 mL premix        2 g 200 mL/hr over 30 Minutes Intravenous On call to O.R. 07/08/22 0751 07/08/22 1049       Assessment/Plan: S/p lap chole 07/08/22 Choledocholithiasis unable to be cleared with ercp -regular diet -chloraseptic for throat -needs gi follow up at higher level of care -medically ready for discharge -restarted doac -Miralax -Throat spray   Douglas Hurst 07/14/2022

## 2022-07-17 NOTE — Telephone Encounter (Signed)
Spoke to the patient's wife who states that patient did just have the cholecystectomy but they couldn't get the stones. [facility] referred patient to [facility] (Phone #: [phone]) and wife stated that she would need another clearance for patient to get the cholecystectomy done again. She also states that [facility] has faxed over clearance but I have informed her that we haven't received it.

## 2022-07-17 NOTE — Telephone Encounter (Signed)
Can you clarify this message as patient was just discharged following cholecystectomy and failed ERCP  Emmaline Life, NP-C    07/17/2022, 3:32 PM [place] [address] Office [phone] Fax [phone]

## 2022-07-17 NOTE — Telephone Encounter (Signed)
Wife calling in for update on the clearance. She state [facility] can get him in next week but need the clearance from our office. Please advise

## 2022-07-20 ENCOUNTER — Telehealth: Payer: Self-pay | Admitting: *Deleted

## 2022-07-20 NOTE — Telephone Encounter (Signed)
Patient with diagnosis of atrial fibrillation on Eliquis for anticoagulation.    Procedure: ERCP Date of procedure: TBD   CHA2DS2-VASc Score = 5   This indicates a 7.2% annual risk of stroke. The patient's score is based upon: CHF History: 1 HTN History: 1 Diabetes History: 1 Stroke History: 0 Vascular Disease History: 0 Age Score: 2 Gender Score: 0   CrCl 100 Platelet count 200  Per office protocol, patient can hold Eliquis for 2 days prior to procedure.   Patient will not need bridging with Lovenox (enoxaparin) around procedure.  **This guidance is not considered finalized until pre-operative APP has relayed final recommendations.**

## 2022-07-20 NOTE — Telephone Encounter (Signed)
   Patient Name: Tyee Vandevoorde.  DOB: 06-29-1941 MRN: [id number]  Primary Cardiologist: Skeet Latch, MD  Chart reviewed as part of pre-operative protocol coverage. Given past medical history and time since last visit, based on ACC/AHA guidelines, Zaidan Keeble. would be at acceptable risk for the planned procedure without further cardiovascular testing.   The patient was advised that if he develops new symptoms prior to surgery to contact our office to arrange for a follow-up visit, and he verbalized understanding.  Patient with diagnosis of atrial fibrillation on Eliquis for anticoagulation.     Procedure: ERCP Date of procedure: TBD     CHA2DS2-VASc Score = 5   This indicates a 7.2% annual risk of stroke. The patient's score is based upon: CHF History: 1 HTN History: 1 Diabetes History: 1 Stroke History: 0 Vascular Disease History: 0 Age Score: 2 Gender Score: 0   CrCl 100 Platelet count 200   Per office protocol, patient can hold Eliquis for 2 days prior to procedure.   Patient will not need bridging with Lovenox (enoxaparin) around procedure.  Please resume Eliquis as soon as possible postprocedure, at the discretion of the surgeon.    I will route this recommendation to the requesting party via Epic fax function and remove from pre-op pool.  Please call with questions.  Lenna Sciara, NP 07/20/2022, 4:08 PM

## 2022-07-20 NOTE — Telephone Encounter (Signed)
Called was dropped into my ext by the operator. When I said this is Douglas Hurst, the pt's wife was on the phone. She is very upset as the pt had a cholecystectomy but Dr. Watt Climes was unable to get all the gallstones and now the pt has been referred to [facility] for procedure to try and remove the gallstones that were left. I did inform the pt's wife that we do have the clearance request from Dr. Thermon Leyland and Dr. Watt Climes, however, I do not see a clearance request from [facility]. Pt's wife states they faxed over a request. I informed her that I do not have it. I asked for her to please have [facility] fax over their request to fax# [phone] attn pre op team and I will be sure to get this reviewed ASAP. Pt's wife thanked me for the help today.

## 2022-07-20 NOTE — Telephone Encounter (Signed)
   Pre-operative Risk Assessment    Patient Name: Douglas Hurst.  DOB: 1941/10/27 MRN: [id number]      Request for Surgical Clearance    Procedure:   ERCP  Date of Surgery:  Clearance TBD                                 Surgeon:  NOT LISTED Surgeon's Group or Practice Name:  [facility] Phone number:  [phone] Fax number:  [phone] ATTN: Donnie Coffin RN   Type of Clearance Requested:   - Medical  - Pharmacy:  Hold Apixaban (Eliquis) REQUESTING HOLD TIME PER THE ASGE GUIDELINES   Type of Anesthesia:  Not Indicated (PROPOFOL?)   Additional requests/questions:    Jiles Prows   07/20/2022, 12:15 PM

## 2022-07-23 DIAGNOSIS — E118 Type 2 diabetes mellitus with unspecified complications: Secondary | ICD-10-CM | POA: Diagnosis not present

## 2022-07-23 DIAGNOSIS — K8050 Calculus of bile duct without cholangitis or cholecystitis without obstruction: Secondary | ICD-10-CM | POA: Diagnosis not present

## 2022-07-23 DIAGNOSIS — I4891 Unspecified atrial fibrillation: Secondary | ICD-10-CM | POA: Diagnosis not present

## 2022-07-27 DIAGNOSIS — Z434 Encounter for attention to other artificial openings of digestive tract: Secondary | ICD-10-CM | POA: Diagnosis not present

## 2022-07-27 DIAGNOSIS — E118 Type 2 diabetes mellitus with unspecified complications: Secondary | ICD-10-CM | POA: Diagnosis not present

## 2022-07-27 DIAGNOSIS — K8050 Calculus of bile duct without cholangitis or cholecystitis without obstruction: Secondary | ICD-10-CM | POA: Diagnosis not present

## 2022-07-27 DIAGNOSIS — I5022 Chronic systolic (congestive) heart failure: Secondary | ICD-10-CM | POA: Diagnosis not present

## 2022-07-27 DIAGNOSIS — J449 Chronic obstructive pulmonary disease, unspecified: Secondary | ICD-10-CM | POA: Diagnosis not present

## 2022-07-27 DIAGNOSIS — Z7984 Long term (current) use of oral hypoglycemic drugs: Secondary | ICD-10-CM | POA: Diagnosis not present

## 2022-07-27 DIAGNOSIS — I119 Hypertensive heart disease without heart failure: Secondary | ICD-10-CM | POA: Diagnosis not present

## 2022-07-30 ENCOUNTER — Inpatient Hospital Stay (HOSPITAL_COMMUNITY)
Admission: EM | Admit: 2022-07-30 | Discharge: 2022-09-08 | DRG: 438 | Disposition: A | Discharge status: Skilled Nursing Facility | Payer: Medicare HMO | Attending: Internal Medicine | Admitting: Internal Medicine

## 2022-07-30 ENCOUNTER — Emergency Department (HOSPITAL_COMMUNITY): Payer: Medicare HMO

## 2022-07-30 DIAGNOSIS — I951 Orthostatic hypotension: Secondary | ICD-10-CM | POA: Diagnosis not present

## 2022-07-30 DIAGNOSIS — J9811 Atelectasis: Secondary | ICD-10-CM | POA: Diagnosis not present

## 2022-07-30 DIAGNOSIS — J449 Chronic obstructive pulmonary disease, unspecified: Secondary | ICD-10-CM | POA: Diagnosis present

## 2022-07-30 DIAGNOSIS — Z7189 Other specified counseling: Secondary | ICD-10-CM | POA: Diagnosis not present

## 2022-07-30 DIAGNOSIS — R64 Cachexia: Secondary | ICD-10-CM | POA: Diagnosis present

## 2022-07-30 DIAGNOSIS — K8581 Other acute pancreatitis with uninfected necrosis: Principal | ICD-10-CM | POA: Diagnosis present

## 2022-07-30 DIAGNOSIS — L02211 Cutaneous abscess of abdominal wall: Secondary | ICD-10-CM | POA: Diagnosis not present

## 2022-07-30 DIAGNOSIS — J9601 Acute respiratory failure with hypoxia: Secondary | ICD-10-CM | POA: Diagnosis not present

## 2022-07-30 DIAGNOSIS — R4182 Altered mental status, unspecified: Secondary | ICD-10-CM | POA: Diagnosis not present

## 2022-07-30 DIAGNOSIS — Z978 Presence of other specified devices: Secondary | ICD-10-CM | POA: Diagnosis not present

## 2022-07-30 DIAGNOSIS — E785 Hyperlipidemia, unspecified: Secondary | ICD-10-CM | POA: Diagnosis present

## 2022-07-30 DIAGNOSIS — T40605A Adverse effect of unspecified narcotics, initial encounter: Secondary | ICD-10-CM | POA: Diagnosis not present

## 2022-07-30 DIAGNOSIS — K265 Chronic or unspecified duodenal ulcer with perforation: Secondary | ICD-10-CM | POA: Diagnosis present

## 2022-07-30 DIAGNOSIS — R1013 Epigastric pain: Secondary | ICD-10-CM | POA: Diagnosis not present

## 2022-07-30 DIAGNOSIS — E1165 Type 2 diabetes mellitus with hyperglycemia: Secondary | ICD-10-CM | POA: Diagnosis not present

## 2022-07-30 DIAGNOSIS — Z823 Family history of stroke: Secondary | ICD-10-CM

## 2022-07-30 DIAGNOSIS — R0902 Hypoxemia: Secondary | ICD-10-CM | POA: Diagnosis not present

## 2022-07-30 DIAGNOSIS — J90 Pleural effusion, not elsewhere classified: Secondary | ICD-10-CM | POA: Diagnosis not present

## 2022-07-30 DIAGNOSIS — Z7901 Long term (current) use of anticoagulants: Secondary | ICD-10-CM

## 2022-07-30 DIAGNOSIS — E119 Type 2 diabetes mellitus without complications: Secondary | ICD-10-CM | POA: Diagnosis not present

## 2022-07-30 DIAGNOSIS — F319 Bipolar disorder, unspecified: Secondary | ICD-10-CM | POA: Diagnosis present

## 2022-07-30 DIAGNOSIS — Z7401 Bed confinement status: Secondary | ICD-10-CM | POA: Diagnosis not present

## 2022-07-30 DIAGNOSIS — R627 Adult failure to thrive: Secondary | ICD-10-CM | POA: Diagnosis not present

## 2022-07-30 DIAGNOSIS — E1169 Type 2 diabetes mellitus with other specified complication: Secondary | ICD-10-CM | POA: Diagnosis not present

## 2022-07-30 DIAGNOSIS — I714 Abdominal aortic aneurysm, without rupture, unspecified: Secondary | ICD-10-CM | POA: Diagnosis not present

## 2022-07-30 DIAGNOSIS — E876 Hypokalemia: Secondary | ICD-10-CM | POA: Diagnosis not present

## 2022-07-30 DIAGNOSIS — R262 Difficulty in walking, not elsewhere classified: Secondary | ICD-10-CM | POA: Diagnosis not present

## 2022-07-30 DIAGNOSIS — I10 Essential (primary) hypertension: Secondary | ICD-10-CM | POA: Diagnosis not present

## 2022-07-30 DIAGNOSIS — K2980 Duodenitis without bleeding: Secondary | ICD-10-CM | POA: Diagnosis present

## 2022-07-30 DIAGNOSIS — I5033 Acute on chronic diastolic (congestive) heart failure: Secondary | ICD-10-CM | POA: Diagnosis not present

## 2022-07-30 DIAGNOSIS — G8929 Other chronic pain: Secondary | ICD-10-CM | POA: Diagnosis present

## 2022-07-30 DIAGNOSIS — Z800 Family history of malignant neoplasm of digestive organs: Secondary | ICD-10-CM

## 2022-07-30 DIAGNOSIS — Z7984 Long term (current) use of oral hypoglycemic drugs: Secondary | ICD-10-CM | POA: Diagnosis not present

## 2022-07-30 DIAGNOSIS — E861 Hypovolemia: Secondary | ICD-10-CM | POA: Diagnosis not present

## 2022-07-30 DIAGNOSIS — D6489 Other specified anemias: Secondary | ICD-10-CM | POA: Diagnosis not present

## 2022-07-30 DIAGNOSIS — N281 Cyst of kidney, acquired: Secondary | ICD-10-CM | POA: Diagnosis not present

## 2022-07-30 DIAGNOSIS — R932 Abnormal findings on diagnostic imaging of liver and biliary tract: Secondary | ICD-10-CM | POA: Diagnosis not present

## 2022-07-30 DIAGNOSIS — M2550 Pain in unspecified joint: Secondary | ICD-10-CM | POA: Diagnosis not present

## 2022-07-30 DIAGNOSIS — K8051 Calculus of bile duct without cholangitis or cholecystitis with obstruction: Secondary | ICD-10-CM | POA: Diagnosis not present

## 2022-07-30 DIAGNOSIS — R6339 Other feeding difficulties: Secondary | ICD-10-CM | POA: Diagnosis not present

## 2022-07-30 DIAGNOSIS — N139 Obstructive and reflux uropathy, unspecified: Secondary | ICD-10-CM | POA: Diagnosis present

## 2022-07-30 DIAGNOSIS — R188 Other ascites: Secondary | ICD-10-CM | POA: Diagnosis not present

## 2022-07-30 DIAGNOSIS — K8689 Other specified diseases of pancreas: Secondary | ICD-10-CM | POA: Diagnosis not present

## 2022-07-30 DIAGNOSIS — K859 Acute pancreatitis without necrosis or infection, unspecified: Principal | ICD-10-CM

## 2022-07-30 DIAGNOSIS — R1011 Right upper quadrant pain: Secondary | ICD-10-CM | POA: Diagnosis not present

## 2022-07-30 DIAGNOSIS — Z9049 Acquired absence of other specified parts of digestive tract: Secondary | ICD-10-CM

## 2022-07-30 DIAGNOSIS — Z8249 Family history of ischemic heart disease and other diseases of the circulatory system: Secondary | ICD-10-CM

## 2022-07-30 DIAGNOSIS — E46 Unspecified protein-calorie malnutrition: Secondary | ICD-10-CM | POA: Diagnosis not present

## 2022-07-30 DIAGNOSIS — K838 Other specified diseases of biliary tract: Secondary | ICD-10-CM | POA: Diagnosis not present

## 2022-07-30 DIAGNOSIS — Z515 Encounter for palliative care: Secondary | ICD-10-CM | POA: Diagnosis not present

## 2022-07-30 DIAGNOSIS — I7143 Infrarenal abdominal aortic aneurysm, without rupture: Secondary | ICD-10-CM | POA: Diagnosis present

## 2022-07-30 DIAGNOSIS — K750 Abscess of liver: Secondary | ICD-10-CM | POA: Diagnosis not present

## 2022-07-30 DIAGNOSIS — Z4682 Encounter for fitting and adjustment of non-vascular catheter: Secondary | ICD-10-CM | POA: Diagnosis not present

## 2022-07-30 DIAGNOSIS — N400 Enlarged prostate without lower urinary tract symptoms: Secondary | ICD-10-CM | POA: Diagnosis not present

## 2022-07-30 DIAGNOSIS — R531 Weakness: Secondary | ICD-10-CM | POA: Diagnosis not present

## 2022-07-30 DIAGNOSIS — R0602 Shortness of breath: Secondary | ICD-10-CM | POA: Diagnosis not present

## 2022-07-30 DIAGNOSIS — R1084 Generalized abdominal pain: Secondary | ICD-10-CM | POA: Diagnosis not present

## 2022-07-30 DIAGNOSIS — I480 Paroxysmal atrial fibrillation: Secondary | ICD-10-CM | POA: Diagnosis present

## 2022-07-30 DIAGNOSIS — Z434 Encounter for attention to other artificial openings of digestive tract: Secondary | ICD-10-CM | POA: Diagnosis not present

## 2022-07-30 DIAGNOSIS — K863 Pseudocyst of pancreas: Secondary | ICD-10-CM | POA: Diagnosis present

## 2022-07-30 DIAGNOSIS — K9189 Other postprocedural complications and disorders of digestive system: Secondary | ICD-10-CM | POA: Diagnosis not present

## 2022-07-30 DIAGNOSIS — E43 Unspecified severe protein-calorie malnutrition: Secondary | ICD-10-CM | POA: Diagnosis present

## 2022-07-30 DIAGNOSIS — K429 Umbilical hernia without obstruction or gangrene: Secondary | ICD-10-CM | POA: Diagnosis not present

## 2022-07-30 DIAGNOSIS — Y848 Other medical procedures as the cause of abnormal reaction of the patient, or of later complication, without mention of misadventure at the time of the procedure: Secondary | ICD-10-CM | POA: Diagnosis present

## 2022-07-30 DIAGNOSIS — K219 Gastro-esophageal reflux disease without esophagitis: Secondary | ICD-10-CM | POA: Diagnosis present

## 2022-07-30 DIAGNOSIS — I110 Hypertensive heart disease with heart failure: Secondary | ICD-10-CM | POA: Diagnosis present

## 2022-07-30 DIAGNOSIS — M549 Dorsalgia, unspecified: Secondary | ICD-10-CM | POA: Diagnosis present

## 2022-07-30 DIAGNOSIS — T502X5A Adverse effect of carbonic-anhydrase inhibitors, benzothiadiazides and other diuretics, initial encounter: Secondary | ICD-10-CM | POA: Diagnosis not present

## 2022-07-30 DIAGNOSIS — K5903 Drug induced constipation: Secondary | ICD-10-CM | POA: Diagnosis not present

## 2022-07-30 DIAGNOSIS — J9690 Respiratory failure, unspecified, unspecified whether with hypoxia or hypercapnia: Secondary | ICD-10-CM | POA: Diagnosis not present

## 2022-07-30 DIAGNOSIS — Z79899 Other long term (current) drug therapy: Secondary | ICD-10-CM

## 2022-07-30 DIAGNOSIS — E871 Hypo-osmolality and hyponatremia: Secondary | ICD-10-CM | POA: Diagnosis not present

## 2022-07-30 DIAGNOSIS — J811 Chronic pulmonary edema: Secondary | ICD-10-CM | POA: Diagnosis not present

## 2022-07-30 DIAGNOSIS — E860 Dehydration: Secondary | ICD-10-CM | POA: Diagnosis present

## 2022-07-30 DIAGNOSIS — R933 Abnormal findings on diagnostic imaging of other parts of digestive tract: Secondary | ICD-10-CM | POA: Diagnosis not present

## 2022-07-30 DIAGNOSIS — N179 Acute kidney failure, unspecified: Secondary | ICD-10-CM | POA: Diagnosis present

## 2022-07-30 DIAGNOSIS — I4891 Unspecified atrial fibrillation: Secondary | ICD-10-CM | POA: Diagnosis not present

## 2022-07-30 DIAGNOSIS — K8050 Calculus of bile duct without cholangitis or cholecystitis without obstruction: Secondary | ICD-10-CM | POA: Diagnosis not present

## 2022-07-30 DIAGNOSIS — R338 Other retention of urine: Secondary | ICD-10-CM | POA: Diagnosis present

## 2022-07-30 DIAGNOSIS — M6281 Muscle weakness (generalized): Secondary | ICD-10-CM | POA: Diagnosis not present

## 2022-07-30 DIAGNOSIS — Z6820 Body mass index (BMI) 20.0-20.9, adult: Secondary | ICD-10-CM

## 2022-07-30 DIAGNOSIS — R269 Unspecified abnormalities of gait and mobility: Secondary | ICD-10-CM | POA: Diagnosis present

## 2022-07-30 DIAGNOSIS — N401 Enlarged prostate with lower urinary tract symptoms: Secondary | ICD-10-CM | POA: Diagnosis present

## 2022-07-30 DIAGNOSIS — K858 Other acute pancreatitis without necrosis or infection: Secondary | ICD-10-CM | POA: Diagnosis not present

## 2022-07-30 DIAGNOSIS — I723 Aneurysm of iliac artery: Secondary | ICD-10-CM | POA: Diagnosis not present

## 2022-07-30 LAB — CBC WITH DIFFERENTIAL/PLATELET
Abs Immature Granulocytes: 0.02 10*3/uL (ref 0.00–0.07)
Basophils Absolute: 0 10*3/uL (ref 0.0–0.1)
Basophils Relative: 0 %
Eosinophils Absolute: 0 10*3/uL (ref 0.0–0.5)
Eosinophils Relative: 0 %
HCT: 43.7 % (ref 39.0–52.0)
Hemoglobin: 13.7 g/dL (ref 13.0–17.0)
Immature Granulocytes: 0 %
Lymphocytes Relative: 3 %
Lymphs Abs: 0.3 10*3/uL — ABNORMAL LOW (ref 0.7–4.0)
MCH: 29.9 pg (ref 26.0–34.0)
MCHC: 31.4 g/dL (ref 30.0–36.0)
MCV: 95.4 fL (ref 80.0–100.0)
Monocytes Absolute: 0.6 10*3/uL (ref 0.1–1.0)
Monocytes Relative: 6 %
Neutro Abs: 8.2 10*3/uL — ABNORMAL HIGH (ref 1.7–7.7)
Neutrophils Relative %: 91 %
Platelets: 298 10*3/uL (ref 150–400)
RBC: 4.58 MIL/uL (ref 4.22–5.81)
RDW: 15.1 % (ref 11.5–15.5)
WBC: 9.1 10*3/uL (ref 4.0–10.5)
nRBC: 0 % (ref 0.0–0.2)

## 2022-07-30 LAB — COMPREHENSIVE METABOLIC PANEL
ALT: 26 U/L (ref 0–44)
AST: 26 U/L (ref 15–41)
Albumin: 3.3 g/dL — ABNORMAL LOW (ref 3.5–5.0)
Alkaline Phosphatase: 68 U/L (ref 38–126)
Anion gap: 17 — ABNORMAL HIGH (ref 5–15)
BUN: 23 mg/dL (ref 8–23)
CO2: 22 mmol/L (ref 22–32)
Calcium: 9.3 mg/dL (ref 8.9–10.3)
Chloride: 99 mmol/L (ref 98–111)
Creatinine, Ser: 1.3 mg/dL — ABNORMAL HIGH (ref 0.61–1.24)
GFR, Estimated: 56 mL/min — ABNORMAL LOW (ref >60)
Glucose, Bld: 203 mg/dL — ABNORMAL HIGH (ref 70–99)
Potassium: 4.1 mmol/L (ref 3.5–5.1)
Sodium: 138 mmol/L (ref 135–145)
Total Bilirubin: 1.3 mg/dL — ABNORMAL HIGH (ref 0.3–1.2)
Total Protein: 6.3 g/dL — ABNORMAL LOW (ref 6.5–8.1)

## 2022-07-30 LAB — LIPASE, BLOOD: Lipase: 21 U/L (ref 11–51)

## 2022-07-30 MED ORDER — IOHEXOL 300 MG/ML  SOLN
85.0000 mL | Freq: Once | INTRAMUSCULAR | Status: AC | PRN
  Administered 2022-07-30: 85 mL via INTRAVENOUS

## 2022-07-30 MED ORDER — LACTATED RINGERS IV SOLN: INTRAVENOUS | Status: DC

## 2022-07-30 MED ORDER — FENTANYL CITRATE PF 50 MCG/ML IJ SOSY
50.0000 ug | PREFILLED_SYRINGE | Freq: Once | INTRAMUSCULAR | Status: AC
  Administered 2022-07-30: 50 ug via INTRAVENOUS
  Filled 2022-07-30: qty 1

## 2022-07-30 MED ORDER — PIPERACILLIN-TAZOBACTAM 3.375 G IVPB
3.3750 g | Freq: Three times a day (TID) | INTRAVENOUS | Status: DC
  Administered 2022-07-30: 3.375 g via INTRAVENOUS
  Filled 2022-07-30: qty 50

## 2022-07-30 MED ORDER — LACTATED RINGERS IV BOLUS: 1000.0000 mL | Freq: Once | INTRAVENOUS | Status: DC

## 2022-07-30 MED ORDER — MORPHINE SULFATE (PF) 4 MG/ML IV SOLN
4.0000 mg | Freq: Once | INTRAVENOUS | Status: AC
  Administered 2022-07-30: 4 mg via INTRAVENOUS
  Filled 2022-07-30: qty 1

## 2022-07-30 NOTE — ED Notes (Signed)
Patient transported to CT 

## 2022-07-30 NOTE — ED Provider Notes (Signed)
[facility] EMERGENCY DEPARTMENT Provider Note   CSN: [id number] Arrival date & time: 07/30/22  2026     History  Chief Complaint  Patient presents with   Abdominal Pain    Mckay Tegtmeyer. is a 81 y.o. male.  HPI 81 year old male past medical history notable for biliary drain, cholecystectomy, choledocholithiasis who is presenting with severe abdominal pain.  Wife and sister-in-law at bedside, contribute to the history.  They report that the patient had a biliary drain placed about 3 months ago.  He had his gallbladder taken out in the middle of July.  He has had known gallstones in his common bile duct, and underwent ERCP earlier today for stone removal.  Patient reports that he has "achy", severe abdominal pain, which he localizes to the right upper quadrant, with some radiation to the right shoulder.  He reports that this abdominal pain is different than the pain created by his stones and began after the surgery.  He denies any nausea, vomiting, fevers.     Home Medications Prior to Admission medications   Medication Sig Start Date End Date Taking? Authorizing Provider  acetaminophen (TYLENOL) 325 MG tablet Take 2 tablets (650 mg total) by mouth every 6 (six) hours as needed for mild pain (or Fever >/= 101). 04/30/22   Raiford Noble Latif, DO  amiodarone (PACERONE) 200 MG tablet Take 1 tablet (200 mg total) by mouth daily. 05/29/22   Loel Dubonnet, NP  apixaban (ELIQUIS) 5 MG TABS tablet Take 1 tablet (5 mg total) by mouth 2 (two) times daily. 05/29/22   Loel Dubonnet, NP  atorvastatin (LIPITOR) 40 MG tablet Take 40 mg by mouth every evening.    [provider]  metFORMIN (GLUCOPHAGE-XR) 500 MG 24 hr tablet Take 500-1,000 mg by mouth See admin instructions. Take 2 tablets (1000 mg) in the morning and 1 tablet (500 mg) at night 03/17/22   [provider]  metoprolol tartrate (LOPRESSOR) 25 MG tablet Take 1 tablet (25 mg total) by mouth 2 (two)  times daily. 05/29/22   Loel Dubonnet, NP  Multiple Vitamin (MULTIVITAMIN) tablet Take 1 tablet by mouth daily.    [provider]  ondansetron (ZOFRAN) 4 MG tablet Take 1 tablet (4 mg total) by mouth every 6 (six) hours as needed for nausea. 04/30/22   Raiford Noble Latif, DO  oxyCODONE-acetaminophen (PERCOCET) 5-325 MG tablet Take 1 tablet by mouth every 4 (four) hours as needed for severe pain. 07/14/22 07/14/23  Norm Parcel, PA-C  pantoprazole (PROTONIX) 40 MG tablet Take 40 mg by mouth every morning.    [provider]  polyethylene glycol powder (GLYCOLAX/MIRALAX) 17 GM/SCOOP powder Take 17 g by mouth daily. Patient taking differently: Take 17 g by mouth daily as needed for mild constipation or moderate constipation. 04/30/22   Sheikh, Omair Latif, DO  senna-docusate (SENOKOT-S) 8.6-50 MG tablet Take 1 tablet by mouth at bedtime. Patient taking differently: Take 1 tablet by mouth at bedtime as needed for mild constipation or moderate constipation. 04/30/22   Sheikh, Omair Latif, DO  SPIRIVA RESPIMAT 2.5 MCG/ACT AERS Inhale 2 puffs into the lungs daily as needed (COPD). 06/04/22   [provider]  tadalafil (CIALIS) 20 MG tablet Take 20 mg by mouth every three (3) days as needed for erectile dysfunction. 03/29/22   [provider]  tamsulosin (FLOMAX) 0.4 MG CAPS capsule Take 0.4 mg by mouth daily after supper. 05/13/22   [provider]  Allergies    Patient has no known allergies.    Review of Systems   Review of Systems As in HPI.  Physical Exam Updated Vital Signs Temp 97.6 F (36.4 C) (Oral)   Ht '6\' 1"'$  (1.854 m)   Wt 80.7 kg   SpO2 98%   BMI 23.48 kg/m  Physical Exam Vitals and nursing note reviewed.  Constitutional:      Appearance: He is well-developed. He is not ill-appearing.     Comments: Uncomfortable appearing, mildly tremulous male.  HENT:     Head: Normocephalic and atraumatic.  Eyes:     Conjunctiva/sclera:  Conjunctivae normal.  Cardiovascular:     Rate and Rhythm: Normal rate and regular rhythm.     Heart sounds: No murmur heard. Pulmonary:     Effort: Pulmonary effort is normal. No respiratory distress.     Breath sounds: Normal breath sounds.  Abdominal:     General: A surgical scar is present.     Tenderness: There is generalized abdominal tenderness. There is guarding.     Comments: Multiple, healing surgical scars.  Small scar along right upper quadrant (reportedly associated with biliary drain per patient and family) with surrounding erythema and some scant drainage.  Diffuse tenderness to palpation with involuntary guarding in all quadrants.  Musculoskeletal:        General: No swelling.     Cervical back: Neck supple.  Skin:    General: Skin is warm and dry.  Neurological:     Mental Status: He is alert.     ED Results / Procedures / Treatments   Labs (all labs ordered are listed, but only abnormal results are displayed) Labs Reviewed  CBC WITH DIFFERENTIAL/PLATELET - Abnormal; Notable for the following components:      Result Value   Neutro Abs 8.2 (*)    Lymphs Abs 0.3 (*)    All other components within normal limits  COMPREHENSIVE METABOLIC PANEL - Abnormal; Notable for the following components:   Glucose, Bld 203 (*)    Creatinine, Ser 1.30 (*)    Total Protein 6.3 (*)    Albumin 3.3 (*)    Total Bilirubin 1.3 (*)    GFR, Estimated 56 (*)    Anion gap 17 (*)    All other components within normal limits  LIPASE, BLOOD    EKG None  Radiology CT ABDOMEN PELVIS W CONTRAST  Result Date: 07/30/2022 CLINICAL DATA:  Abdominal pain, post ERCP EXAM: CT ABDOMEN AND PELVIS WITH CONTRAST TECHNIQUE: Multidetector CT imaging of the abdomen and pelvis was performed using the standard protocol following bolus administration of intravenous contrast. RADIATION DOSE REDUCTION: This exam was performed according to the departmental dose-optimization program which includes automated  exposure control, adjustment of the mA and/or kV according to patient size and/or use of iterative reconstruction technique. CONTRAST:  80m OMNIPAQUE IOHEXOL 300 MG/ML  SOLN COMPARISON:  CT 06/23/2022 FINDINGS: Lower chest: No acute abnormality. Right basilar scarring/atelectasis. Hepatobiliary: Focal round hyperdensities in the hepatic hilum are presumed cholecystectomy clips (series 3/image 21). Pneumobilia likely from ERCP earlier today. The common bile duct is dilated measuring 1.3 cm in diameter and demonstrates mucosal hyperenhancement. Pancreas: Acute peripancreatic fluid about the head of the pancreas. 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 No pancreatic ductal dilation. Spleen: Normal in size without focal abnormality. Adrenals/Urinary Tract: Adrenal glands are unremarkable. Kidneys are normal, without renal calculi, focal lesion, or hydronephrosis. Bladder is unremarkable. Stomach/Bowel: Moderate hiatal hernia. Stomach  is unremarkable. Wall thickening and hyperenhancement of the duodenum. Multiple duodenal diverticula. No definite duodenal perforation. Normal caliber large and small bowel. The appendix is not definitely seen. Vascular/Lymphatic: Mixed density aortic atherosclerotic plaque. Redemonstrated infrarenal abdominal aortic aneurysm measuring 4.0 cm. No suspicious abdominal or pelvic lymphadenopathy. Reproductive: Enlarged prostate. Other: Focal hyperdensity measuring 5 mm within the right pericolic gutter (series 3/image 38) is indeterminate but may represent a dropped gallstone. There is adjacent fluid and stranding. Musculoskeletal: Unchanged lytic lesion in the left iliac wing. IMPRESSION: 1. Findings compatible with post ERCP pancreatitis. There is poor enhancement of the pancreatic head concerning for developing pancreatic necrosis. 2. Wall thickening and hyperenhancement of the duodenum is likely reactive secondary to pancreatitis. No  definite perforation. 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 3. New hyperdensity within the right pericolic gutter with surrounding fluid and inflammatory change is indeterminate but concerning for a dropped gallstone. 4. Infrarenal abdominal aortic aneurysm measuring 4 mm is unchanged from 06/23/2022. Recommend follow-up every 12 months and vascular consultation. Critical Value/emergent results were called by telephone at the time of interpretation on 07/30/2022 at 9:45 pm to provider Sherwood Gambler , who verbally acknowledged these results. Electronically Signed   By: Placido Sou M.D.   On: 07/30/2022 22:00    Procedures Procedures    Medications Ordered in ED Medications  piperacillin-tazobactam (ZOSYN) IVPB 3.375 g (has no administration in time range)  fentaNYL (SUBLIMAZE) injection 50 mcg (has no administration in time range)  lactated ringers infusion (has no administration in time range)  morphine (PF) 4 MG/ML injection 4 mg (4 mg Intravenous Given 07/30/22 2145)  iohexol (OMNIPAQUE) 300 MG/ML solution 85 mL (85 mLs Intravenous Contrast Given 07/30/22 2127)    ED Course/ Medical Decision Making/ A&P                           Medical Decision Making Amount and/or Complexity of Data Reviewed Labs: ordered. Radiology: ordered.  Risk Prescription drug management.    Vearl Allbaugh. is a 81 year old male past medical history notable for choledocholithiasis who underwent ERCP today who is presenting with severe abdominal pain.  Vitals at presentation within normal limits.  Patient is hemodynamically stable, afebrile, satting well on room air.  Physical exam concerning for generalized abdominal tenderness with guarding.  Otherwise, normal heart sounds.  Lungs clear to auscultation bilaterally. No pitting edema.  Lab work obtained, resulted notable for CBC without leukocytosis or anemia.  Lipase within normal limits.  CMP demonstrates AKI, with creatinine 1.3, increased  from 0.73-1.0 on lab work 2-3 weeks prior.  Anion gap was 17 present.   Imaging was pursued given significant tenderness and recent instrumentation, notable for findings compatible with post ERCP pancreatitis, concern for developing pancreatic necrosis.  We will admit the patient to medicine for his pancreatitis, including IV fluids, IV pain medications, and IV antibiotics.  The plan for this patient was discussed with Dr. Regenia Skeeter, who voiced agreement and who oversaw evaluation and treatment of this patient.   Final Clinical Impression(s) / ED Diagnoses Final diagnoses:  Post-ERCP acute pancreatitis    Rx / DC Orders ED Discharge Orders     None         Faylene Million, MD 07/30/22 2322    Sherwood Gambler, MD 07/30/22 [phone]

## 2022-07-30 NOTE — Progress Notes (Signed)
Pharmacy Antibiotic Note  Douglas Hurst. is a 81 y.o. male admitted on 07/30/2022 with possible intraabdominal infection s/p ERCP 8/3 and previous lap chole and ERCPs this year. Pharmacy has been consulted for piperacillin/tazobactam dosing.  WBC wnl, afebrile. ClCr ~52 ml/min.  Plan: Zosyn 3.375g IV q8h (4 hour infusion). Monitor cultures, clinical status, renal function  Narrow abx as able and f/u duration   Height: '6\' 1"'$  (185.4 cm) Weight: 80.7 kg (178 lb) IBW/kg (Calculated) : 79.9  Temp (24hrs), Avg:97.6 F (36.4 C), Min:97.6 F (36.4 C), Max:97.6 F (36.4 C)  Recent Labs  Lab 07/30/22 2048  WBC 9.1  CREATININE 1.30*    Estimated Creatinine Clearance: 52.1 mL/min (A) (by C-G formula based on SCr of 1.3 mg/dL (H)).    No Known Allergies  Antimicrobials this admission: piptazo 8/3 >>    Dose adjustments this admission:    Microbiology results:   Thank you for allowing pharmacy to be a part of this patient's care.  Benetta Spar, PharmD, BCPS, BCCP Clinical Pharmacist  Please check AMION for all [place] phone numbers After 10:00 PM, call [facility] [phone]

## 2022-07-30 NOTE — ED Triage Notes (Signed)
Pt [name] from home for abdominal pain that started this morning after his gallstone removal this morning at 10am. 15mg Fentynl given PTA and pain goes from 10/10 to 8/10  162/70 84HR  16 RR  98% RA  CBG 208

## 2022-07-31 DIAGNOSIS — E871 Hypo-osmolality and hyponatremia: Secondary | ICD-10-CM | POA: Diagnosis not present

## 2022-07-31 DIAGNOSIS — E876 Hypokalemia: Secondary | ICD-10-CM | POA: Diagnosis not present

## 2022-07-31 DIAGNOSIS — K859 Acute pancreatitis without necrosis or infection, unspecified: Secondary | ICD-10-CM

## 2022-07-31 DIAGNOSIS — K8581 Other acute pancreatitis with uninfected necrosis: Secondary | ICD-10-CM | POA: Diagnosis present

## 2022-07-31 DIAGNOSIS — E860 Dehydration: Secondary | ICD-10-CM | POA: Diagnosis present

## 2022-07-31 DIAGNOSIS — R188 Other ascites: Secondary | ICD-10-CM | POA: Diagnosis not present

## 2022-07-31 DIAGNOSIS — N281 Cyst of kidney, acquired: Secondary | ICD-10-CM | POA: Diagnosis not present

## 2022-07-31 DIAGNOSIS — N179 Acute kidney failure, unspecified: Secondary | ICD-10-CM | POA: Diagnosis present

## 2022-07-31 DIAGNOSIS — D6489 Other specified anemias: Secondary | ICD-10-CM | POA: Diagnosis not present

## 2022-07-31 DIAGNOSIS — E1165 Type 2 diabetes mellitus with hyperglycemia: Secondary | ICD-10-CM | POA: Diagnosis not present

## 2022-07-31 DIAGNOSIS — R932 Abnormal findings on diagnostic imaging of liver and biliary tract: Secondary | ICD-10-CM | POA: Diagnosis not present

## 2022-07-31 DIAGNOSIS — I10 Essential (primary) hypertension: Secondary | ICD-10-CM | POA: Diagnosis not present

## 2022-07-31 DIAGNOSIS — I7143 Infrarenal abdominal aortic aneurysm, without rupture: Secondary | ICD-10-CM | POA: Diagnosis present

## 2022-07-31 DIAGNOSIS — L02211 Cutaneous abscess of abdominal wall: Secondary | ICD-10-CM | POA: Diagnosis not present

## 2022-07-31 DIAGNOSIS — I110 Hypertensive heart disease with heart failure: Secondary | ICD-10-CM | POA: Diagnosis present

## 2022-07-31 DIAGNOSIS — K750 Abscess of liver: Secondary | ICD-10-CM | POA: Diagnosis not present

## 2022-07-31 DIAGNOSIS — J449 Chronic obstructive pulmonary disease, unspecified: Secondary | ICD-10-CM | POA: Diagnosis present

## 2022-07-31 DIAGNOSIS — Z7189 Other specified counseling: Secondary | ICD-10-CM | POA: Diagnosis not present

## 2022-07-31 DIAGNOSIS — E1169 Type 2 diabetes mellitus with other specified complication: Secondary | ICD-10-CM | POA: Diagnosis not present

## 2022-07-31 DIAGNOSIS — N139 Obstructive and reflux uropathy, unspecified: Secondary | ICD-10-CM | POA: Diagnosis present

## 2022-07-31 DIAGNOSIS — R627 Adult failure to thrive: Secondary | ICD-10-CM | POA: Diagnosis not present

## 2022-07-31 DIAGNOSIS — J9601 Acute respiratory failure with hypoxia: Secondary | ICD-10-CM | POA: Diagnosis not present

## 2022-07-31 DIAGNOSIS — R1084 Generalized abdominal pain: Secondary | ICD-10-CM | POA: Diagnosis not present

## 2022-07-31 DIAGNOSIS — J9690 Respiratory failure, unspecified, unspecified whether with hypoxia or hypercapnia: Secondary | ICD-10-CM | POA: Diagnosis not present

## 2022-07-31 DIAGNOSIS — R0902 Hypoxemia: Secondary | ICD-10-CM | POA: Diagnosis not present

## 2022-07-31 DIAGNOSIS — Y848 Other medical procedures as the cause of abnormal reaction of the patient, or of later complication, without mention of misadventure at the time of the procedure: Secondary | ICD-10-CM | POA: Diagnosis present

## 2022-07-31 DIAGNOSIS — J811 Chronic pulmonary edema: Secondary | ICD-10-CM | POA: Diagnosis not present

## 2022-07-31 DIAGNOSIS — E43 Unspecified severe protein-calorie malnutrition: Secondary | ICD-10-CM | POA: Diagnosis present

## 2022-07-31 DIAGNOSIS — Z4682 Encounter for fitting and adjustment of non-vascular catheter: Secondary | ICD-10-CM | POA: Diagnosis not present

## 2022-07-31 DIAGNOSIS — K429 Umbilical hernia without obstruction or gangrene: Secondary | ICD-10-CM | POA: Diagnosis not present

## 2022-07-31 DIAGNOSIS — K265 Chronic or unspecified duodenal ulcer with perforation: Secondary | ICD-10-CM | POA: Diagnosis present

## 2022-07-31 DIAGNOSIS — I480 Paroxysmal atrial fibrillation: Secondary | ICD-10-CM | POA: Diagnosis present

## 2022-07-31 DIAGNOSIS — R64 Cachexia: Secondary | ICD-10-CM | POA: Diagnosis present

## 2022-07-31 DIAGNOSIS — K9189 Other postprocedural complications and disorders of digestive system: Secondary | ICD-10-CM | POA: Diagnosis not present

## 2022-07-31 DIAGNOSIS — Z434 Encounter for attention to other artificial openings of digestive tract: Secondary | ICD-10-CM | POA: Diagnosis not present

## 2022-07-31 DIAGNOSIS — I5033 Acute on chronic diastolic (congestive) heart failure: Secondary | ICD-10-CM | POA: Diagnosis not present

## 2022-07-31 DIAGNOSIS — K863 Pseudocyst of pancreas: Secondary | ICD-10-CM | POA: Diagnosis present

## 2022-07-31 DIAGNOSIS — F319 Bipolar disorder, unspecified: Secondary | ICD-10-CM | POA: Diagnosis present

## 2022-07-31 DIAGNOSIS — K8689 Other specified diseases of pancreas: Secondary | ICD-10-CM | POA: Diagnosis not present

## 2022-07-31 DIAGNOSIS — K858 Other acute pancreatitis without necrosis or infection: Secondary | ICD-10-CM | POA: Diagnosis not present

## 2022-07-31 DIAGNOSIS — I714 Abdominal aortic aneurysm, without rupture, unspecified: Secondary | ICD-10-CM | POA: Diagnosis not present

## 2022-07-31 DIAGNOSIS — R1011 Right upper quadrant pain: Secondary | ICD-10-CM | POA: Diagnosis not present

## 2022-07-31 DIAGNOSIS — I723 Aneurysm of iliac artery: Secondary | ICD-10-CM | POA: Diagnosis not present

## 2022-07-31 DIAGNOSIS — Z515 Encounter for palliative care: Secondary | ICD-10-CM | POA: Diagnosis not present

## 2022-07-31 DIAGNOSIS — J90 Pleural effusion, not elsewhere classified: Secondary | ICD-10-CM | POA: Diagnosis not present

## 2022-07-31 DIAGNOSIS — R1013 Epigastric pain: Secondary | ICD-10-CM | POA: Diagnosis not present

## 2022-07-31 DIAGNOSIS — J9811 Atelectasis: Secondary | ICD-10-CM | POA: Diagnosis not present

## 2022-07-31 DIAGNOSIS — K8050 Calculus of bile duct without cholangitis or cholecystitis without obstruction: Secondary | ICD-10-CM | POA: Diagnosis not present

## 2022-07-31 DIAGNOSIS — E785 Hyperlipidemia, unspecified: Secondary | ICD-10-CM | POA: Diagnosis present

## 2022-07-31 DIAGNOSIS — R0602 Shortness of breath: Secondary | ICD-10-CM | POA: Diagnosis not present

## 2022-07-31 LAB — LACTIC ACID, PLASMA
Lactic Acid, Venous: 4.9 mmol/L (ref 0.5–1.9)
Lactic Acid, Venous: 6.2 mmol/L (ref 0.5–1.9)

## 2022-07-31 LAB — CBC
HCT: 39.4 % (ref 39.0–52.0)
Hemoglobin: 13 g/dL (ref 13.0–17.0)
MCH: 30.4 pg (ref 26.0–34.0)
MCHC: 33 g/dL (ref 30.0–36.0)
MCV: 92.1 fL (ref 80.0–100.0)
Platelets: 248 10*3/uL (ref 150–400)
RBC: 4.28 MIL/uL (ref 4.22–5.81)
RDW: 15.2 % (ref 11.5–15.5)
WBC: 6.3 10*3/uL (ref 4.0–10.5)
nRBC: 0 % (ref 0.0–0.2)

## 2022-07-31 LAB — GLUCOSE, CAPILLARY
Glucose-Capillary: 111 mg/dL — ABNORMAL HIGH (ref 70–99)
Glucose-Capillary: 129 mg/dL — ABNORMAL HIGH (ref 70–99)

## 2022-07-31 LAB — COMPREHENSIVE METABOLIC PANEL
ALT: 23 U/L (ref 0–44)
AST: 24 U/L (ref 15–41)
Albumin: 2.8 g/dL — ABNORMAL LOW (ref 3.5–5.0)
Alkaline Phosphatase: 59 U/L (ref 38–126)
Anion gap: 14 (ref 5–15)
BUN: 23 mg/dL (ref 8–23)
CO2: 20 mmol/L — ABNORMAL LOW (ref 22–32)
Calcium: 8.8 mg/dL — ABNORMAL LOW (ref 8.9–10.3)
Chloride: 102 mmol/L (ref 98–111)
Creatinine, Ser: 1.16 mg/dL (ref 0.61–1.24)
GFR, Estimated: >60 mL/min (ref >60)
Glucose, Bld: 188 mg/dL — ABNORMAL HIGH (ref 70–99)
Potassium: 3.8 mmol/L (ref 3.5–5.1)
Sodium: 136 mmol/L (ref 135–145)
Total Bilirubin: 0.9 mg/dL (ref 0.3–1.2)
Total Protein: 5.5 g/dL — ABNORMAL LOW (ref 6.5–8.1)

## 2022-07-31 LAB — PROCALCITONIN: Procalcitonin: 4.25 ng/mL

## 2022-07-31 LAB — CBG MONITORING, ED
Glucose-Capillary: 171 mg/dL — ABNORMAL HIGH (ref 70–99)
Glucose-Capillary: 166 mg/dL — ABNORMAL HIGH (ref 70–99)
Glucose-Capillary: 170 mg/dL — ABNORMAL HIGH (ref 70–99)
Glucose-Capillary: 137 mg/dL — ABNORMAL HIGH (ref 70–99)

## 2022-07-31 LAB — C-REACTIVE PROTEIN: CRP: 5.1 mg/dL — ABNORMAL HIGH (ref <1.0)

## 2022-07-31 MED ORDER — HEPARIN SODIUM (PORCINE) 5000 UNIT/ML IJ SOLN
5000.0000 [IU] | Freq: Three times a day (TID) | INTRAMUSCULAR | Status: DC
  Administered 2022-07-31 – 2022-08-01 (×4): 5000 [IU] via SUBCUTANEOUS
  Filled 2022-07-31 (×4): qty 1

## 2022-07-31 MED ORDER — ACETAMINOPHEN 650 MG RE SUPP: 650.0000 mg | Freq: Four times a day (QID) | RECTAL | Status: DC | PRN

## 2022-07-31 MED ORDER — ACETAMINOPHEN 325 MG PO TABS: 650.0000 mg | ORAL_TABLET | Freq: Four times a day (QID) | ORAL | Status: DC | PRN

## 2022-07-31 MED ORDER — UMECLIDINIUM BROMIDE 62.5 MCG/ACT IN AEPB: 1.0000 | INHALATION_SPRAY | Freq: Every day | RESPIRATORY_TRACT | Status: DC | PRN

## 2022-07-31 MED ORDER — MEROPENEM 1 G IV SOLR
1.0000 g | Freq: Three times a day (TID) | INTRAVENOUS | Status: DC
  Administered 2022-07-31 – 2022-08-02 (×7): 1 g via INTRAVENOUS
  Filled 2022-07-31 (×8): qty 20

## 2022-07-31 MED ORDER — OXYCODONE-ACETAMINOPHEN 5-325 MG PO TABS: 1.0000 | ORAL_TABLET | ORAL | Status: DC | PRN

## 2022-07-31 MED ORDER — HYDROMORPHONE HCL 1 MG/ML IJ SOLN
1.0000 mg | INTRAMUSCULAR | Status: DC | PRN
  Administered 2022-07-31 – 2022-08-01 (×6): 1 mg via INTRAVENOUS
  Filled 2022-07-31 (×6): qty 1

## 2022-07-31 MED ORDER — LACTATED RINGERS IV BOLUS
1000.0000 mL | Freq: Once | INTRAVENOUS | Status: AC
  Administered 2022-07-31: 1000 mL via INTRAVENOUS

## 2022-07-31 MED ORDER — ONDANSETRON HCL 4 MG PO TABS: 4.0000 mg | ORAL_TABLET | Freq: Four times a day (QID) | ORAL | Status: DC | PRN

## 2022-07-31 MED ORDER — OXYCODONE-ACETAMINOPHEN 5-325 MG PO TABS
1.0000 | ORAL_TABLET | ORAL | Status: DC | PRN
  Filled 2022-07-31: qty 1

## 2022-07-31 MED ORDER — TIOTROPIUM BROMIDE MONOHYDRATE 2.5 MCG/ACT IN AERS
2.0000 | INHALATION_SPRAY | Freq: Every day | RESPIRATORY_TRACT | Status: DC | PRN
Start: 2022-07-31 — End: 2022-07-31

## 2022-07-31 MED ORDER — MORPHINE SULFATE (PF) 2 MG/ML IV SOLN: 1.0000 mg | INTRAVENOUS | Status: DC | PRN

## 2022-07-31 MED ORDER — INSULIN ASPART 100 UNIT/ML IJ SOLN
0.0000 [IU] | INTRAMUSCULAR | Status: DC
  Administered 2022-07-31 – 2022-08-06 (×3): 1 [IU] via SUBCUTANEOUS

## 2022-07-31 MED ORDER — ONDANSETRON HCL 4 MG/2ML IJ SOLN: 4.0000 mg | Freq: Four times a day (QID) | INTRAMUSCULAR | Status: DC | PRN

## 2022-07-31 MED ORDER — LACTATED RINGERS IV SOLN
INTRAVENOUS | Status: DC
Start: 2022-07-31 — End: 2022-08-02

## 2022-07-31 NOTE — Progress Notes (Signed)
Pharmacy Antibiotic Note  Douglas Hurst. is a 81 y.o. male admitted on 07/30/2022 with  concern for necrotizing pancreatitis .  Pharmacy has been consulted to change ABX to meropenem.  Plan: Meropenem 1g IV Q8H.  Height: '6\' 1"'$  (185.4 cm) Weight: 80.7 kg (178 lb) IBW/kg (Calculated) : 79.9  Temp (24hrs), Avg:97.6 F (36.4 C), Min:97.6 F (36.4 C), Max:97.6 F (36.4 C)  Recent Labs  Lab 07/30/22 2048  WBC 9.1  CREATININE 1.30*    Estimated Creatinine Clearance: 52.1 mL/min (A) (by C-G formula based on SCr of 1.3 mg/dL (H)).    No Known Allergies   Thank you for allowing pharmacy to be a part of this patient's care.  Wynona Neat, PharmD, BCPS  07/31/2022 2:52 AM

## 2022-07-31 NOTE — H&P (Signed)
History and Physical    Douglas Hurst. MEQ:[id number] DOB: 01-Aug-1941 DOA: 07/30/2022  PCP: Lavone Orn, MD  Patient coming from: home  I have personally briefly reviewed patient's old medical records in [place]  Chief Complaint: abdominal pain s/p ercp  HPI: Douglas Hurst. is a 81 y.o. male with medical history significant of bipolar d/o DMII, HTN, HLD, GERD, COPD , Afib ,diastolic heart failure, AAA with interim history of acute cholecystitis 1.3cm gallstone in the neck of the gallbladder and 51m CBD stone with proximal biliary dilatation noted  on admission on 4/29. He was managed with chocholecystostomy tube. At that time his course was further complicated by afib with rvr and CHF with ref. Patient of note was intolerant of beta blocker due to hypotension and was transitioned to amiodarone. Patient  was discharged on amiodarone and Eliquis. Patient  after discharge was optimized and cleared for surgery. He was noted to have recovery of his ef with echo  5/16 noting ef of 60-65% and no RWMA.He underwent ERCP  but CBD was unable to be explored ,patient was referred for cholecystectomy and CBD exploration. Patient underwent laparoscopic cholecystectomy with laparoscopic common duct exploration which failed to clear the duct 7/23 with plan for repeat ERCP at tertiary center. [facility] . He tolerated the procedure well  and was discharged home.  Patient notes few hour after being home he started to experience severe abdominal pain  with radiation to right shoulder. He notes no fever/ chills/n/v/diarrhea associated with his symptoms of abdominal pain. He currently does endorse difficulty with urination.  ED Course:   On evaluation in ED CT Scan was notable for acute pancreatitis with area of necrosis. Gi DR Magod was consulted who recommend ivfs , supportive care as well as  antibiotics.   vitals Afeb  bp 156/72, hr 86, rr 15  NA 138, K 4.1, cr 1.3  base  0.73ast 26, alt 26 alkpyhos 68 Glu 203 Lipase 21 Wbc 9.1, hgb 13.7,  plt 298  Ctabd/pelvis IMPRESSION: 1. Findings compatible with post ERCP pancreatitis. There is poor enhancement of the pancreatic head concerning for developing pancreatic necrosis. 2. Wall thickening and hyperenhancement of the duodenum is likely reactive secondary to pancreatitis. No definite perforation. 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 3. New hyperdensity within the right pericolic gutter with surrounding fluid and inflammatory change is indeterminate but concerning for a dropped gallstone. 4. Infrarenal abdominal aortic aneurysm measuring 4 mm is unchanged from 06/23/2022. Recommend follow-up every 12 months and vascular consultation.   Review of Systems: As per HPI otherwise 10 point review of systems negative.   Past Medical History:  Diagnosis Date   [place] disorder ([place]    Chronic back pain    Complication of anesthesia    prolonged sedation and confusion   COPD (chronic obstructive pulmonary disease) (HCC)    Dr. gLaurann Montana  Diabetes mellitus without complication ([place]    type 2   Diverticulosis    Dysrhythmia    Afib  onset 03-2022   ED (erectile dysfunction)    Gallstones    GERD (gastroesophageal reflux disease)    Hiatal hernia    Hyperlipidemia    Hypertension    Wears dentures    upper   Wears glasses    Wears hearing aid in both ears     Past Surgical History:  Procedure Laterality Date  BALLOON DILATION N/A 10/01/2015   Procedure: BALLOON DILATION;  Surgeon: Garlan Fair, MD;  Location: [facility];  Service: Endoscopy;  Laterality: N/A;   CATARACT EXTRACTION, BILATERAL     CHOLECYSTECTOMY N/A 07/08/2022   Procedure: LAPAROSCOPIC CHOLECYSTECTOMY WITH INTRAOPERATIVE CHOLANGIOGRAM AND LAPAROSCOPIC COMMON DUCT EXPLORATION;  Surgeon: Felicie Morn, MD;  Location: [facility];   Service: General;  Laterality: N/A;   COLONOSCOPY     removed polyps   COLONOSCOPY WITH PROPOFOL N/A 09/01/2016   Procedure: COLONOSCOPY WITH PROPOFOL;  Surgeon: Garlan Fair, MD;  Location: [facility];  Service: Endoscopy;  Laterality: N/A;   ERCP N/A 07/09/2022   Procedure: ENDOSCOPIC RETROGRADE CHOLANGIOPANCREATOGRAPHY (ERCP);  Surgeon: Clarene Essex, MD;  Location: [facility];  Service: Gastroenterology;  Laterality: N/A;   ESOPHAGEAL DILATION  06/23/2022   Procedure: PYLORIC  DILATION;  Surgeon: Clarene Essex, MD;  Location: [facility];  Service: Gastroenterology;;   ESOPHAGOGASTRODUODENOSCOPY N/A 06/23/2022   Procedure: ESOPHAGOGASTRODUODENOSCOPY (EGD);  Surgeon: Clarene Essex, MD;  Location: [facility];  Service: Gastroenterology;  Laterality: N/A;   ESOPHAGOGASTRODUODENOSCOPY (EGD) WITH PROPOFOL N/A 10/01/2015   Procedure: ESOPHAGOGASTRODUODENOSCOPY (EGD) WITH PROPOFOL;  Surgeon: Garlan Fair, MD;  Location: [facility];  Service: Endoscopy;  Laterality: N/A;   FISTULOTOMY  07/09/2022   Procedure: FISTULOTOMY;  Surgeon: Clarene Essex, MD;  Location: [facility];  Service: Gastroenterology;;   HEMOSTASIS CLIP PLACEMENT  06/23/2022   Procedure: HEMOSTASIS CLIP PLACEMENT;  Surgeon: Clarene Essex, MD;  Location: [facility];  Service: Gastroenterology;;   IR EXCHANGE BILIARY DRAIN  05/15/2022   IR EXCHANGE BILIARY DRAIN  05/26/2022   IR EXCHANGE BILIARY DRAIN  06/11/2022   IR EXCHANGE BILIARY DRAIN  06/23/2022   IR PERC CHOLECYSTOSTOMY  04/26/2022   KNEE ARTHROSCOPY     TONSILLECTOMY       reports that he has quit smoking. His smoking use included cigarettes. He has never used smokeless tobacco. He reports that he does not drink alcohol and does not use drugs.  No Known Allergies  Family History  Problem Relation Age of Onset   Heart attack Mother    Cancer - Colon Mother    CAD Mother    Cancer Mother    Dementia Mother    CAD Father    Cancer Father    CVA Father    Dementia  Father    Heart attack Brother    CAD Brother     Prior to Admission medications   Medication Sig Start Date End Date Taking? Authorizing Provider  acetaminophen (TYLENOL) 325 MG tablet Take 2 tablets (650 mg total) by mouth every 6 (six) hours as needed for mild pain (or Fever >/= 101). 04/30/22   Raiford Noble Latif, DO  amiodarone (PACERONE) 200 MG tablet Take 1 tablet (200 mg total) by mouth daily. 05/29/22   Loel Dubonnet, NP  apixaban (ELIQUIS) 5 MG TABS tablet Take 1 tablet (5 mg total) by mouth 2 (two) times daily. 05/29/22   Loel Dubonnet, NP  atorvastatin (LIPITOR) 40 MG tablet Take 40 mg by mouth every evening.    [provider]  metFORMIN (GLUCOPHAGE-XR) 500 MG 24 hr tablet Take 500-1,000 mg by mouth See admin instructions. Take 2 tablets (1000 mg) in the morning and 1 tablet (500 mg) at night 03/17/22   [provider]  metoprolol tartrate (LOPRESSOR) 25 MG tablet Take 1 tablet (25 mg total) by mouth 2 (two) times daily. 05/29/22   Loel Dubonnet, NP  Multiple Vitamin (  MULTIVITAMIN) tablet Take 1 tablet by mouth daily.    [provider]  ondansetron (ZOFRAN) 4 MG tablet Take 1 tablet (4 mg total) by mouth every 6 (six) hours as needed for nausea. 04/30/22   Raiford Noble Latif, DO  oxyCODONE-acetaminophen (PERCOCET) 5-325 MG tablet Take 1 tablet by mouth every 4 (four) hours as needed for severe pain. 07/14/22 07/14/23  Norm Parcel, PA-C  pantoprazole (PROTONIX) 40 MG tablet Take 40 mg by mouth every morning.    [provider]  polyethylene glycol powder (GLYCOLAX/MIRALAX) 17 GM/SCOOP powder Take 17 g by mouth daily. Patient taking differently: Take 17 g by mouth daily as needed for mild constipation or moderate constipation. 04/30/22   Sheikh, Omair Latif, DO  senna-docusate (SENOKOT-S) 8.6-50 MG tablet Take 1 tablet by mouth at bedtime. Patient taking differently: Take 1 tablet by mouth at bedtime as needed for mild constipation or moderate  constipation. 04/30/22   Sheikh, Omair Latif, DO  SPIRIVA RESPIMAT 2.5 MCG/ACT AERS Inhale 2 puffs into the lungs daily as needed (COPD). 06/04/22   [provider]  tadalafil (CIALIS) 20 MG tablet Take 20 mg by mouth every three (3) days as needed for erectile dysfunction. 03/29/22   [provider]  tamsulosin (FLOMAX) 0.4 MG CAPS capsule Take 0.4 mg by mouth daily after supper. 05/13/22   [provider]    Physical Exam: Vitals:   07/30/22 2045 07/30/22 2145 07/30/22 2230 07/30/22 2315  BP: (!) 156/72 (!) 156/76 (!) 142/73 121/88  Pulse: 86 87 84 85  Resp: 15 (!) 24 (!) 29 18  Temp:      TempSrc:      SpO2: 92% 96% 93% 93%  Weight:      Height:         Vitals:   07/30/22 2045 07/30/22 2145 07/30/22 2230 07/30/22 2315  BP: (!) 156/72 (!) 156/76 (!) 142/73 121/88  Pulse: 86 87 84 85  Resp: 15 (!) 24 (!) 29 18  Temp:      TempSrc:      SpO2: 92% 96% 93% 93%  Weight:      Height:      Constitutional: NAD, calm, comfortable Eyes: PERRL, lids and conjunctivae normal ENMT: Mucous membranes are moist. Posterior pharynx clear of any exudate or lesions.Normal dentition.  Neck: normal, supple, no masses, no thyromegaly Respiratory: clear to auscultation bilaterally, no wheezing, no crackles. Normal respiratory effort. No accessory muscle use.  Cardiovascular: Regular rate and rhythm, no murmurs / rubs / gallops. No extremity edema. 2+ pedal pulses. No carotid bruits.  Abdomen: epigastric tenderness, no masses palpated. No hepatosplenomegaly. Bowel sounds positive. Distended  abdomen, also suprapubic fullness.  Musculoskeletal: no clubbing / cyanosis. No joint deformity upper and lower extremities. Good ROM, no contractures. Normal muscle tone.  Skin: no rashes, lesions, ulcers. No induration Neurologic: CN 2-12 grossly intact. Sensation intact, DTR normal. Strength 5/5 in all 4.  Psychiatric: Normal judgment and insight. Alert and oriented x 3. Normal mood.     Labs on Admission: I have personally reviewed following labs and imaging studies  CBC: Recent Labs  Lab 07/30/22 2048  WBC 9.1  NEUTROABS 8.2*  HGB 13.7  HCT 43.7  MCV 95.4  PLT 354   Basic Metabolic Panel: Recent Labs  Lab 07/30/22 2048  NA 138  K 4.1  CL 99  CO2 22  GLUCOSE 203*  BUN 23  CREATININE 1.30*  CALCIUM 9.3   GFR: Estimated Creatinine Clearance: 52.1 mL/min (  A) (by C-G formula based on SCr of 1.3 mg/dL (H)). Liver Function Tests: Recent Labs  Lab 07/30/22 2048  AST 26  ALT 26  ALKPHOS 68  BILITOT 1.3*  PROT 6.3*  ALBUMIN 3.3*   Recent Labs  Lab 07/30/22 2048  LIPASE 21   No results for input(s): "AMMONIA" in the last 168 hours. Coagulation Profile: No results for input(s): "INR", "PROTIME" in the last 168 hours. Cardiac Enzymes: No results for input(s): "CKTOTAL", "CKMB", "CKMBINDEX", "TROPONINI" in the last 168 hours. BNP (last 3 results) No results for input(s): "PROBNP" in the last 8760 hours. HbA1C: No results for input(s): "HGBA1C" in the last 72 hours. CBG: No results for input(s): "GLUCAP" in the last 168 hours. Lipid Profile: No results for input(s): "CHOL", "HDL", "LDLCALC", "TRIG", "CHOLHDL", "LDLDIRECT" in the last 72 hours. Thyroid Function Tests: No results for input(s): "TSH", "T4TOTAL", "FREET4", "T3FREE", "THYROIDAB" in the last 72 hours. Anemia Panel: No results for input(s): "VITAMINB12", "FOLATE", "FERRITIN", "TIBC", "IRON", "RETICCTPCT" in the last 72 hours. Urine analysis:    Component Value Date/Time   COLORURINE YELLOW 04/25/2022 1232   APPEARANCEUR CLEAR 04/25/2022 1232   LABSPEC <1.005 (L) 04/25/2022 1232   PHURINE 6.0 04/25/2022 1232   GLUCOSEU 100 (A) 04/25/2022 1232   HGBUR TRACE (A) 04/25/2022 1232   BILIRUBINUR NEGATIVE 04/25/2022 1232   KETONESUR NEGATIVE 04/25/2022 1232   PROTEINUR NEGATIVE 04/25/2022 1232   UROBILINOGEN 4.0 (H) 10/02/2015 2135   NITRITE POSITIVE (A) 04/25/2022 1232    LEUKOCYTESUR SMALL (A) 04/25/2022 1232    Radiological Exams on Admission: CT ABDOMEN PELVIS W CONTRAST  Result Date: 07/30/2022 CLINICAL DATA:  Abdominal pain, post ERCP EXAM: CT ABDOMEN AND PELVIS WITH CONTRAST TECHNIQUE: Multidetector CT imaging of the abdomen and pelvis was performed using the standard protocol following bolus administration of intravenous contrast. RADIATION DOSE REDUCTION: This exam was performed according to the departmental dose-optimization program which includes automated exposure control, adjustment of the mA and/or kV according to patient size and/or use of iterative reconstruction technique. CONTRAST:  3m OMNIPAQUE IOHEXOL 300 MG/ML  SOLN COMPARISON:  CT 06/23/2022 FINDINGS: Lower chest: No acute abnormality. Right basilar scarring/atelectasis. Hepatobiliary: Focal round hyperdensities in the hepatic hilum are presumed cholecystectomy clips (series 3/image 21). Pneumobilia likely from ERCP earlier today. The common bile duct is dilated measuring 1.3 cm in diameter and demonstrates mucosal hyperenhancement. Pancreas: Acute peripancreatic fluid about the head of the pancreas. 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 No pancreatic ductal dilation. Spleen: Normal in size without focal abnormality. Adrenals/Urinary Tract: Adrenal glands are unremarkable. Kidneys are normal, without renal calculi, focal lesion, or hydronephrosis. Bladder is unremarkable. Stomach/Bowel: Moderate hiatal hernia. Stomach is unremarkable. Wall thickening and hyperenhancement of the duodenum. Multiple duodenal diverticula. No definite duodenal perforation. Normal caliber large and small bowel. The appendix is not definitely seen. Vascular/Lymphatic: Mixed density aortic atherosclerotic plaque. Redemonstrated infrarenal abdominal aortic aneurysm measuring 4.0 cm. No suspicious abdominal or pelvic lymphadenopathy. Reproductive: Enlarged prostate. Other: Focal  hyperdensity measuring 5 mm within the right pericolic gutter (series 3/image 38) is indeterminate but may represent a dropped gallstone. There is adjacent fluid and stranding. Musculoskeletal: Unchanged lytic lesion in the left iliac wing. IMPRESSION: 1. Findings compatible with post ERCP pancreatitis. There is poor enhancement of the pancreatic head concerning for developing pancreatic necrosis. 2. Wall thickening and hyperenhancement of the duodenum is likely reactive secondary to pancreatitis. No definite perforation. 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 3. New hyperdensity within the right  pericolic gutter with surrounding fluid and inflammatory change is indeterminate but concerning for a dropped gallstone. 4. Infrarenal abdominal aortic aneurysm measuring 4 mm is unchanged from 06/23/2022. Recommend follow-up every 12 months and vascular consultation. Critical Value/emergent results were called by telephone at the time of interpretation on 07/30/2022 at 9:45 pm to provider Sherwood Gambler , who verbally acknowledged these results. Electronically Signed   By: Placido Sou M.D.   On: 07/30/2022 22:00    EKG: Independently reviewed. 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  - admit to progressive care  -npo - ivfs per protocol  -broad spectrum abx due CT read suggestive of necrosis -follow inflammatory markers , lactic  - GI consulted Dr Watt Climes, will follow with patient in am  -supportive care antiemetic pain management    AKI  -due to dehydration  -continue with ivfs -above nephrotoxic medications    DMII -place on q6h poc and iss    HTN -stable resume home regimen with holding parameters   HLD -hold statin    GERD -iv ppi daily    COPD  -no acute exacerbation  -continue home controller medication  Hx of Href with recovered Ef Grade 1 diastolic heart failure -resume cardiac home regimen as able   Afib  -continue on  amiodarone and eliquis  -AC on hold for 48 hours s/p procedure   AAA  -stable on imaging   -DVT prophylaxis:  Heparin  AC on hold 48 hours s/p procedure  Code Status: full Family Communication: none at bedside Disposition Plan: patient  expected to be admitted greater than 2 midnights   Consults called: Gi Magod Admission status: progressive care    Clance Boll MD [facility]   If 7PM-7AM, please contact night-coverage [email or web address] Password [email or web address]  07/31/2022, 1:43 AM

## 2022-07-31 NOTE — ED Notes (Signed)
Upon assessment of patient on this RN rounding, patient was pale, diaphoretic, and shivering. RN checked CBG, rectal temperature- both WDL. Bladder scan showed 455cc. Both night admitter MD Marcello Moores) and day time MD Alanda Amass) paged at this time with no response.

## 2022-07-31 NOTE — Progress Notes (Addendum)
Progress Note   Patient: Douglas Hurst. WVP:[id number] DOB: [birth year] DOA: 07/30/2022     0 DOS: the patient was seen and examined on 07/31/2022   Brief hospital course:  81 y.o. male with medical history significant of bipolar d/o DMII, HTN, HLD, GERD, COPD , Afib ,diastolic heart failure, AAA with interim history of acute cholecystitis 1.3cm gallstone in the neck of the gallbladder and 89m CBD stone with proximal biliary dilatation noted  on admission on 4/29. He was managed with chocholecystostomy tube. At that time his course was further complicated by afib with rvr and CHF with ref. Patient of note was intolerant of beta blocker due to hypotension and was transitioned to amiodarone. Patient  was discharged on amiodarone and Eliquis. Patient  after discharge was optimized and cleared for surgery. He was noted to have recovery of his ef with echo  5/16 noting ef of 60-65% and no RWMA.He underwent ERCP  but CBD was unable to be explored ,patient was referred for cholecystectomy and CBD exploration. Patient underwent laparoscopic cholecystectomy with laparoscopic common duct exploration which failed to clear the duct 7/23 with plan for repeat ERCP at tertiary center[facility] . He tolerated the procedure well  and was discharged home.  Patient notes few hour after being home he started to experience severe abdominal pain  with radiation to right shoulder. He notes no fever/ chills/n/v/diarrhea associated with his symptoms of abdominal pain. He currently does endorse difficulty with urination.  8/4- Lipase normal, GI consult, awaiting formal recommendations     Assessment and Plan: Acute Post ERCP Pancreatitis with area of necrosis  - admit to progressive care  -npo - ivfs per protocol  -broad spectrum abx due CT read suggestive of necrosis -follow inflammatory markers , lactic  - GI  consulted Dr MWatt Climes will follow with patient in am  -supportive care antiemetic pain management      AKI  -due to dehydration  -continue with ivfs -avoid nephrotoxic medications     DMII -place on q6h poc and iss      HTN -stable resume home regimen with holding parameters    HLD -hold statin     GERD -iv ppi daily     COPD  -no acute exacerbation  -continue home controller medication   Hx of Href with recovered Ef Grade 1 diastolic heart failure -resume cardiac home regimen as able     Afib  -continue on amiodarone and eliquis  -AC on hold for 48 hours s/p procedure    AAA  -stable on imaging    -DVT prophylaxis:  Heparin  AC on hold 48 hours s/p procedure  Code Status: full Family Communication: none at bedside Disposition Plan: patient  expected to be admitted greater than 2 midnights     Consults called: Gi Magod Admission status: progressive care        Subjective: C/o abdominal pain overnight. Received I&O urine catheter for urinary retention BP stable, notable tachypnea, low grade temp 99.1.  HR 90s.    Physical Exam: Vitals:   07/30/22 2315 07/31/22 0400 07/31/22 0426 07/31/22 0650  BP: 121/88 118/62    Pulse: 85 94    Resp: 18 (!) 21    Temp:   98.3 F (36.8 C) 99.1 F (37.3 C)  TempSrc:   Axillary Rectal  SpO2: 93% 90%    Weight:  Height:      Constitutional: NAD, calm, comfortable Eyes: PERRL, lids and conjunctivae normal ENMT: Mucous membranes are moist. Posterior pharynx clear of any exudate or lesions.Normal dentition.  Neck: normal, supple, no masses, no thyromegaly Respiratory: clear to auscultation bilaterally, no wheezing, no crackles. Normal respiratory effort. No accessory muscle use.  Cardiovascular: Regular rate and rhythm, no murmurs / rubs / gallops. No extremity edema. 2+ pedal pulses. No carotid bruits.  Abdomen: epigastric tenderness, no masses palpated. No hepatosplenomegaly. Bowel sounds positive. Distended   abdomen, also suprapubic fullness.  Musculoskeletal: no clubbing / cyanosis. No joint deformity upper and lower extremities. Good ROM, no contractures. Normal muscle tone.  Skin: no rashes, lesions, ulcers. No induration Neurologic: CN 2-12 grossly intact. Sensation intact, DTR normal. Strength 5/5 in all 4.  Psychiatric: Normal judgment and insight. Alert and oriented x 3. Normal mood.    Data Reviewed:  There are no new results to review at this time.  Family Communication: none available during rounds  Disposition: Status is: Inpatient Remains inpatient appropriate because: abd pain, possible ERCP by GI  Planned Discharge Destination: Home    Time spent: 25 minutes  Author: Vanna Scotland, MD 07/31/2022 7:31 AM  For on call review [email or web address].

## 2022-07-31 NOTE — ED Notes (Signed)
No response from MD. Patient is c/o 10/10 suprapubic pain. Shaking and still pale and diaphoretic. Patient in n out catherterized with 600cc of urine output

## 2022-07-31 NOTE — Consult Note (Signed)
Referring Provider:  ED Primary Care Physician:  Lavone Orn, MD Primary Gastroenterologist:  [facility]  Reason for Consultation: Abdominal pain  HPI: Douglas Hurst. is a 81 y.o. male with past medical history as mentioned below underwent advanced endoscopy yesterday at [facility] with Dr. Okey Dupre for retained CBD stone.  ERCP was attempted here by Dr. 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  He underwent EUS,Rendezvous ERCP and sphincterotomy with 2 stone removal yesterday.  Regular ERCP was unsuccessful.  Dr. Okey Dupre did a transduodenal approach to puncture the extrahepatic bile duct to pass a guidewire in to the duodenum.  Subsequently ERCP scope was inserted and cannulation was performed over the guidewire.  Largest one was around 12 mm in size.  2 stones were removed from CBD.  Patient presented later yesterday evening to the emergency department here with severe abdominal pain.  Initial blood work showed normal CBC, normal lipase, essentially normal LFTs.  CT abdomen pelvis with contrast yesterday showed acute peripancreatic fluid collection around the pancreatic head with poor enhancement of the pancreatic head concerning for developing pancreatic necrosis . it also showed wall thickening and hyperenhancement of the duodenum.  Patient seen and examined at bedside.  Most of the history obtained from patient's wife.  According to patient's wife, patient started having more abdominal pain after coming back from [facility].  Also had some chills and sweating.  Denied having any fever.  Denied nausea or vomiting.  Denied blood in the stool or black stool.  Past Medical History:  Diagnosis Date   Bipolar disorder ([place])    Chronic back pain    Complication of anesthesia    prolonged sedation and confusion   COPD (chronic obstructive pulmonary disease) (HCC)    Dr. Laurann Montana   Diabetes mellitus without complication ([place])    type 2    Diverticulosis    Dysrhythmia    Afib  onset 03-2022   ED (erectile dysfunction)    Gallstones    GERD (gastroesophageal reflux disease)    Hiatal hernia    Hyperlipidemia    Hypertension    Wears dentures    upper   Wears glasses    Wears hearing aid in both ears     Past Surgical History:  Procedure Laterality Date   BALLOON DILATION N/A 10/01/2015   Procedure: BALLOON DILATION;  Surgeon: Garlan Fair, MD;  Location: [facility];  Service: Endoscopy;  Laterality: N/A;   CATARACT EXTRACTION, BILATERAL     CHOLECYSTECTOMY N/A 07/08/2022   Procedure: LAPAROSCOPIC CHOLECYSTECTOMY WITH INTRAOPERATIVE CHOLANGIOGRAM AND LAPAROSCOPIC COMMON DUCT EXPLORATION;  Surgeon: Felicie Morn, MD;  Location: [facility] ORS;  Service: General;  Laterality: N/A;   COLONOSCOPY     removed polyps   COLONOSCOPY WITH PROPOFOL N/A 09/01/2016   Procedure: COLONOSCOPY WITH PROPOFOL;  Surgeon: Garlan Fair, MD;  Location: [facility] END[facility];  Service: Endoscopy;  Laterality: N/A;   ERCP N/A 07/09/2022   Procedure: ENDOSCOPIC RETROGRADE CHOLANGIOPANCREATOGRAPHY (ERCP);  Surgeon: Clarene Essex, MD;  Location: [facility];  Service: Gastroenterology;  Laterality: N/A;   ESOPHAGEAL DILATION  06/23/2022   Procedure: PYLORIC  DILATION;  Surgeon: Clarene Essex, MD;  Location: [facility];  Service: Gastroenterology;;   ESOPHAGOGASTRODUODENOSCOPY N/A 06/23/2022   Procedure: ESOPHAGOGASTRODUODENOSCOPY (EGD);  Surgeon: Clarene Essex, MD;  Location: [facility];  Service: Gastroenterology;  Laterality: N/A;   ESOPHAGOGASTRODUODENOSCOPY (EGD) WITH PROPOFOL N/A 10/01/2015   Procedure: ESOPHAGOGASTRODUODENOSCOPY (EGD) WITH PROPOFOL;  Surgeon: Garlan Fair, MD;  Location: [facility];  Service: Endoscopy;  Laterality: N/A;   FISTULOTOMY  07/09/2022   Procedure: FISTULOTOMY;  Surgeon: Clarene Essex, MD;  Location: [facility];  Service: Gastroenterology;;   HEMOSTASIS CLIP PLACEMENT  06/23/2022   Procedure: HEMOSTASIS CLIP  PLACEMENT;  Surgeon: Clarene Essex, MD;  Location: [facility];  Service: Gastroenterology;;   IR EXCHANGE BILIARY DRAIN  05/15/2022   IR EXCHANGE BILIARY DRAIN  05/26/2022   IR EXCHANGE BILIARY DRAIN  06/11/2022   IR EXCHANGE BILIARY DRAIN  06/23/2022   IR PERC CHOLECYSTOSTOMY  04/26/2022   KNEE ARTHROSCOPY     TONSILLECTOMY      Prior to Admission medications   Medication Sig Start Date End Date Taking? Authorizing Provider  acetaminophen (TYLENOL) 325 MG tablet Take 2 tablets (650 mg total) by mouth every 6 (six) hours as needed for mild pain (or Fever >/= 101). 04/30/22  Yes Sheikh, Omair Latif, DO  amiodarone (PACERONE) 200 MG tablet Take 1 tablet (200 mg total) by mouth daily. 05/29/22  Yes Loel Dubonnet, NP  atorvastatin (LIPITOR) 40 MG tablet Take 40 mg by mouth every evening.   Yes [provider]  lisinopril-hydrochlorothiazide (ZESTORETIC) 10-12.5 MG tablet Take 0.5 tablets by mouth daily.   Yes [provider]  metFORMIN (GLUCOPHAGE-XR) 500 MG 24 hr tablet Take 500-1,000 mg by mouth See admin instructions. Take 2 tablets (1000 mg) by mouth in the morning and 1 tablet (500 mg) every evening 03/17/22  Yes [provider]  metoprolol tartrate (LOPRESSOR) 25 MG tablet Take 1 tablet (25 mg total) by mouth 2 (two) times daily. 05/29/22  Yes Loel Dubonnet, NP  Multiple Vitamin (MULTIVITAMIN WITH MINERALS) TABS tablet Take 1 tablet by mouth daily.   Yes [provider]  ondansetron (ZOFRAN) 4 MG tablet Take 1 tablet (4 mg total) by mouth every 6 (six) hours as needed for nausea. 04/30/22  Yes Sheikh, Omair Latif, DO  pantoprazole (PROTONIX) 40 MG tablet Take 40 mg by mouth every morning.   Yes [provider]  polyethylene glycol powder (GLYCOLAX/MIRALAX) 17 GM/SCOOP powder Take 17 g by mouth daily. Patient taking differently: Take 17 g by mouth daily as needed for mild constipation or moderate constipation. 04/30/22  Yes Sheikh, Omair Latif, DO   senna-docusate (SENOKOT-S) 8.6-50 MG tablet Take 1 tablet by mouth at bedtime. Patient taking differently: Take 1 tablet by mouth at bedtime as needed for mild constipation or moderate constipation. 04/30/22  Yes Sheikh, Omair Latif, DO  SPIRIVA RESPIMAT 2.5 MCG/ACT AERS Inhale 2 puffs into the lungs daily as needed (COPD). 06/04/22  Yes [provider]  tadalafil (CIALIS) 20 MG tablet Take 20 mg by mouth every three (3) days as needed for erectile dysfunction. 03/29/22  Yes [provider]  tamsulosin (FLOMAX) 0.4 MG CAPS capsule Take 0.4 mg by mouth daily after supper. 05/13/22  Yes [provider]  apixaban (ELIQUIS) 5 MG TABS tablet Take 1 tablet (5 mg total) by mouth 2 (two) times daily. Patient not taking: Reported on 07/31/2022 05/29/22   Loel Dubonnet, NP  oxyCODONE-acetaminophen (PERCOCET) 5-325 MG tablet Take 1 tablet by mouth every 4 (four) hours as needed for severe pain. Patient not taking: Reported on 07/31/2022 07/14/22 07/14/23  Norm Parcel, PA-C    Scheduled Meds:  heparin  5,000 Units Subcutaneous Q8H   insulin aspart  0-6 Units Subcutaneous Q4H   Continuous Infusions:  lactated ringers 75 mL/hr at 07/31/22 0756   meropenem (MERREM) IV Stopped (  07/31/22 0520)   PRN Meds:.acetaminophen **OR** acetaminophen, HYDROmorphone (DILAUDID) injection, morphine injection, ondansetron **OR** ondansetron (ZOFRAN) IV, oxyCODONE-acetaminophen, Tiotropium Bromide Monohydrate  Allergies as of 07/30/2022   (No Known Allergies)    Family History  Problem Relation Age of Onset   Heart attack Mother    Cancer - Colon Mother    CAD Mother    Cancer Mother    Dementia Mother    CAD Father    Cancer Father    CVA Father    Dementia Father    Heart attack Brother    CAD Brother     Social History   Socioeconomic History   Marital status: Married    Spouse name: Not on file   Number of children: Not on file   Years of education: Not on file   Highest  education level: Not on file  Occupational History   Not on file  Tobacco Use   Smoking status: Former    Years: 35.00    Types: Cigarettes   Smokeless tobacco: Never  Vaping Use   Vaping Use: Never used  Substance and Sexual Activity   Alcohol use: No   Drug use: No   Sexual activity: Not Currently  Other Topics Concern   Not on file  Social History Narrative   Not on file   Social Determinants of Health   Financial Resource Strain: Not on file  Food Insecurity: Not on file  Transportation Needs: Not on file  Physical Activity: Not on file  Stress: Not on file  Social Connections: Not on file  Intimate Partner Violence: Not on file    Review of Systems: All negative except as stated above in HPI.  Physical Exam: Vital signs: Vitals:   07/31/22 0800 07/31/22 0900  BP: (!) 118/53 (!) 107/56  Pulse: 93 97  Resp: 18 19  Temp:    SpO2: 96% 93%     General:   Daily patient, resting comfortably Lungs: No visible respiratory distress Heart:  Regular rate and rhythm; no murmurs, clicks, rubs,  or gallops. Abdomen: Epigastric and right upper quadrant tenderness to palpation, abdomen is soft, bowel sounds present, no peritoneal signs Rectal:  Deferred  GI:  Lab Results: Recent Labs    07/30/22 2048 07/31/22 0257  WBC 9.1 6.3  HGB 13.7 13.0  HCT 43.7 39.4  PLT 298 248   BMET Recent Labs    07/30/22 2048 07/31/22 0257  NA 138 136  K 4.1 3.8  CL 99 102  CO2 22 20*  GLUCOSE 203* 188*  BUN 23 23  CREATININE 1.30* 1.16  CALCIUM 9.3 8.8*   LFT Recent Labs    07/31/22 0257  PROT 5.5*  ALBUMIN 2.8*  AST 24  ALT 23  ALKPHOS 59  BILITOT 0.9   PT/INR No results for input(s): "LABPROT", "INR" in the last 72 hours.   Studies/Results: CT ABDOMEN PELVIS W CONTRAST  Result Date: 07/30/2022 CLINICAL DATA:  Abdominal pain, post ERCP EXAM: CT ABDOMEN AND PELVIS WITH CONTRAST TECHNIQUE: Multidetector CT imaging of the abdomen and pelvis was performed using  the standard protocol following bolus administration of intravenous contrast. RADIATION DOSE REDUCTION: This exam was performed according to the departmental dose-optimization program which includes automated exposure control, adjustment of the mA and/or kV according to patient size and/or use of iterative reconstruction technique. CONTRAST:  8m OMNIPAQUE IOHEXOL 300 MG/ML  SOLN COMPARISON:  CT 06/23/2022 FINDINGS: Lower chest: No acute abnormality. Right basilar scarring/atelectasis. Hepatobiliary: Focal round hyperdensities  in the hepatic hilum are presumed cholecystectomy clips (series 3/image 21). Pneumobilia likely from ERCP earlier today. The common bile duct is dilated measuring 1.3 cm in diameter and demonstrates mucosal hyperenhancement. Pancreas: Acute peripancreatic fluid about the head of the pancreas. 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 No pancreatic ductal dilation. Spleen: Normal in size without focal abnormality. Adrenals/Urinary Tract: Adrenal glands are unremarkable. Kidneys are normal, without renal calculi, focal lesion, or hydronephrosis. Bladder is unremarkable. Stomach/Bowel: Moderate hiatal hernia. Stomach is unremarkable. Wall thickening and hyperenhancement of the duodenum. Multiple duodenal diverticula. No definite duodenal perforation. Normal caliber large and small bowel. The appendix is not definitely seen. Vascular/Lymphatic: Mixed density aortic atherosclerotic plaque. Redemonstrated infrarenal abdominal aortic aneurysm measuring 4.0 cm. No suspicious abdominal or pelvic lymphadenopathy. Reproductive: Enlarged prostate. Other: Focal hyperdensity measuring 5 mm within the right pericolic gutter (series 3/image 38) is indeterminate but may represent a dropped gallstone. There is adjacent fluid and stranding. Musculoskeletal: Unchanged lytic lesion in the left iliac wing. IMPRESSION: 1. Findings compatible with post ERCP  pancreatitis. There is poor enhancement of the pancreatic head concerning for developing pancreatic necrosis. 2. Wall thickening and hyperenhancement of the duodenum is likely reactive secondary to pancreatitis. No definite perforation. 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 3. New hyperdensity within the right pericolic gutter with surrounding fluid and inflammatory change is indeterminate but concerning for a dropped gallstone. 4. Infrarenal abdominal aortic aneurysm measuring 4 mm is unchanged from 06/23/2022. Recommend follow-up every 12 months and vascular consultation. Critical Value/emergent results were called by telephone at the time of interpretation on 07/30/2022 at 9:45 pm to provider Sherwood Gambler , who verbally acknowledged these results. Electronically Signed   By: Placido Sou M.D.   On: 07/30/2022 22:00    Impression/Plan: -Worsening abdominal pain s/p complicated ERCP at [facility] as mentioned below yesterday.    CT scan shows pancreatitis involving pancreatic head as well as thickening of the duodenum.  Normal lipase.  Normal LFTs.   He underwent EUS,Rendezvous ERCP and sphincterotomy with 2 stone removal yesterday.  Regular ERCP was unsuccessful.  Dr. Okey Dupre did a transduodenal approach to puncture the extrahepatic bile duct to pass a guidewire in to the duodenum.  Subsequently ERCP scope was inserted and cannulation was performed over the guidewire.  Largest one was around 12 mm in size.  2 stones were removed from CBD.  Recommendations --------------------------- -Long discussion with patient's wife at bedside.  Case also discussed with Dr. Watt Climes. -Fluid around the pancreas and inflammation of the duodenum likely from contained duodenal perforation from transduodenal approach to get access to common bile duct.  However pancreatitis do remain the differential. -Increase IV hydration to 150 cc/h -Continue antibiotics/meropenem -May benefit from repeat CT scan with IV  contrast in next few days to rule out pancreatic necrosis. -GI will follow  Records from care everywhere reviewed.    LOS: 0 days   Otis Brace  MD, FACP 07/31/2022, 9:39 AM  Contact #  [phone]

## 2022-07-31 NOTE — ED Notes (Signed)
ED TO INPATIENT HANDOFF REPORT  ED Nurse Name and Phone #: [phone]   S Name/Age/Gender Douglas Hurst. 81 y.o. male Room/Bed: 001C/001C  Code Status   Code Status: Full Code  Home/SNF/Other Home Patient oriented to: self, place, time, and situation Is this baseline? Yes   Triage Complete: Triage complete  Chief Complaint Acute pancreatitis [K85.90]  Triage Note Pt [name] from home for abdominal pain that started this morning after his gallstone removal this morning at 10am. 142mg Fentynl given PTA and pain goes from 10/10 to 8/10  162/70 84HR  16 RR  98% RA  CBG 208   Allergies No Known Allergies  Level of Care/Admitting Diagnosis ED Disposition     ED Disposition  Admit   Condition  --   Comment  [facility]: [id number]]  Level of Care: Progressive [102]  Admit to Progressive based on following criteria: GI, ENDOCRINE disease patients with GI bleeding, acute liver failure or pancreatitis, stable with diabetic ketoacidosis or thyrotoxicosis (hypothyroid) state.  May admit patient to [facility] equivalent level of care is available:: No  Covid Evaluation: Asymptomatic - no recent exposure (last 10 days) testing not required  Diagnosis: Acute pancreatitis [577.0.ICD-9-CM]  Admitting Physician: TClance Boll[[[id number]] Attending Physician: TClance Boll[[[id number]] Certification:: I certify this patient will need inpatient services for at least 2 midnights  Estimated Length of Stay: 3          B Medical/Surgery History Past Medical History:  Diagnosis Date   Bipolar disorder ([facility]    Chronic back pain    Complication of anesthesia    prolonged sedation and confusion   COPD (chronic obstructive pulmonary disease) ([facility]    Dr. 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   Diverticulosis    Dysrhythmia    Afib  onset 03-2022   ED (erectile dysfunction)    Gallstones     GERD (gastroesophageal reflux disease)    Hiatal hernia    Hyperlipidemia    Hypertension    Wears dentures    upper   Wears glasses    Wears hearing aid in both ears    Past Surgical History:  Procedure Laterality Date   BALLOON DILATION N/A 10/01/2015   Procedure: BALLOON DILATION;  Surgeon: MGarlan Fair MD;  [facility]: [facility];  Service: Endoscopy;  Laterality: N/A;   CATARACT EXTRACTION, BILATERAL     CHOLECYSTECTOMY N/A 07/08/2022   Procedure: LAPAROSCOPIC CHOLECYSTECTOMY WITH INTRAOPERATIVE CHOLANGIOGRAM AND LAPAROSCOPIC COMMON DUCT EXPLORATION;  Surgeon: SFelicie Morn MD;  Location: [facility];  Service: General;  Laterality: N/A;   COLONOSCOPY     removed polyps   COLONOSCOPY WITH PROPOFOL N/A 09/01/2016   Procedure: COLONOSCOPY WITH PROPOFOL;  Surgeon: MGarlan Fair MD;  [facility];  Service: Endoscopy;  Laterality: N/A;   ERCP N/A 07/09/2022   Procedure: ENDOSCOPIC RETROGRADE CHOLANGIOPANCREATOGRAPHY (ERCP);  Surgeon: MClarene Essex MD;  [facility];  Service: Gastroenterology;  Laterality: N/A;   ESOPHAGEAL DILATION  06/23/2022   Procedure: PYLORIC  DILATION;  Surgeon: MClarene Essex MD;  [facility];  Service: Gastroenterology;;   ESOPHAGOGASTRODUODENOSCOPY N/A 06/23/2022   Procedure: ESOPHAGOGASTRODUODENOSCOPY (EGD);  Surgeon: MClarene Essex MD;  [facility];  Service: Gastroenterology;  Laterality: N/A;   ESOPHAGOGASTRODUODENOSCOPY (EGD) WITH PROPOFOL N/A 10/01/2015   Procedure: ESOPHAGOGASTRODUODENOSCOPY (EGD) WITH PROPOFOL;  Surgeon: MGarlan Fair MD;  [facility];  Service: Endoscopy;  Laterality: N/A;   FISTULOTOMY  07/09/2022   Procedure: FISTULOTOMY;  Surgeon: Clarene Essex, MD;  [facility];  Service: Gastroenterology;;   HEMOSTASIS CLIP PLACEMENT  06/23/2022   Procedure: HEMOSTASIS CLIP PLACEMENT;  Surgeon: Clarene Essex, MD;  [facility];  Service: Gastroenterology;;   IR EXCHANGE  BILIARY DRAIN  05/15/2022   IR EXCHANGE BILIARY DRAIN  05/26/2022   IR EXCHANGE BILIARY DRAIN  06/11/2022   IR EXCHANGE BILIARY DRAIN  06/23/2022   IR PERC CHOLECYSTOSTOMY  04/26/2022   KNEE ARTHROSCOPY     TONSILLECTOMY       A IV Location/Drains/Wounds Patient Lines/Drains/Airways Status     Active Line/Drains/Airways     Name Placement date Placement time Site Days   Peripheral IV 07/30/22 22 G Anterior;Distal;Left;Upper Arm 07/30/22  2033  Arm  1   Peripheral IV 07/30/22 20 G Right Antecubital 07/30/22  2047  Antecubital  1   Closed System Drain 1 Right Abdomen Bulb (JP) 19 Fr. 07/08/22  1241  Abdomen  23   Incision (Closed) 07/08/22 Abdomen 07/08/22  1058  -- 23   Incision - 3 Ports Abdomen 1: Right 2: Medial;Upper 3: Medial;Umbilicus 95/18/84  --  -- 23            Intake/Output Last 24 hours  Intake/Output Summary (Last 24 hours) at 07/31/2022 1438 Last data filed at 07/31/2022 0520 Gross per 24 hour  Intake 151.51 ml  Output --  Net 151.51 ml    Labs/Imaging Results for orders placed or performed during the hospital encounter of 07/30/22 (from the past 48 hour(s))  CBC with Differential     Status: Abnormal   Collection Time: 07/30/22  8:48 PM  Result Value Ref Range   WBC 9.1 4.0 - 10.5 K/uL   RBC 4.58 4.22 - 5.81 MIL/uL   Hemoglobin 13.7 13.0 - 17.0 g/dL   HCT 43.7 39.0 - 52.0 %   MCV 95.4 80.0 - 100.0 fL   MCH 29.9 26.0 - 34.0 pg   MCHC 31.4 30.0 - 36.0 g/dL   RDW 15.1 11.5 - 15.5 %   Platelets 298 150 - 400 K/uL   nRBC 0.0 0.0 - 0.2 %   Neutrophils Relative % 91 %   Neutro Abs 8.2 (H) 1.7 - 7.7 K/uL   Lymphocytes Relative 3 %   Lymphs Abs 0.3 (L) 0.7 - 4.0 K/uL   Monocytes Relative 6 %   Monocytes Absolute 0.6 0.1 - 1.0 K/uL   Eosinophils Relative 0 %   Eosinophils Absolute 0.0 0.0 - 0.5 K/uL   Basophils Relative 0 %   Basophils Absolute 0.0 0.0 - 0.1 K/uL   Immature Granulocytes 0 %   Abs Immature Granulocytes 0.02 0.00 - 0.07 K/uL    Comment:  Performed at [facility], [address], [place], [place] [address]  Comprehensive metabolic panel     Status: Abnormal   Collection Time: 07/30/22  8:48 PM  Result Value Ref Range   Sodium 138 135 - 145 mmol/L   Potassium 4.1 3.5 - 5.1 mmol/L   Chloride 99 98 - 111 mmol/L   CO2 22 22 - 32 mmol/L   Glucose, Bld 203 (H) 70 - 99 mg/dL    Comment: Glucose reference range applies only to samples taken after fasting for at least 8 hours.   BUN 23 8 - 23 mg/dL   Creatinine, Ser 1.30 (H) 0.61 - 1.24 mg/dL   Calcium 9.3  8.9 - 10.3 mg/dL   Total Protein 6.3 (L) 6.5 - 8.1 g/dL   Albumin 3.3 (L) 3.5 - 5.0 g/dL   AST 26 15 - 41 U/L   ALT 26 0 - 44 U/L   Alkaline Phosphatase 68 38 - 126 U/L   Total Bilirubin 1.3 (H) 0.3 - 1.2 mg/dL   GFR, Estimated 56 (L) >60 mL/min    Comment: (NOTE) Calculated using the CKD-EPI Creatinine Equation (2021)    Anion gap 17 (H) 5 - 15    Comment: Performed at [facility] [address] [place], [place] [address]  Lipase, blood     Status: None   Collection Time: 07/30/22  8:48 PM  Result Value Ref Range   Lipase 21 11 - 51 U/L    Comment: Performed at [facility] [address] [place], [place] [address]  CBC     Status: None   Collection Time: 07/31/22  2:57 AM  Result Value Ref Range   WBC 6.3 4.0 - 10.5 K/uL   RBC 4.28 4.22 - 5.81 MIL/uL   Hemoglobin 13.0 13.0 - 17.0 g/dL   HCT 39.4 39.0 - 52.0 %   MCV 92.1 80.0 - 100.0 fL   MCH 30.4 26.0 - 34.0 pg   MCHC 33.0 30.0 - 36.0 g/dL   RDW 15.2 11.5 - 15.5 %   Platelets 248 150 - 400 K/uL   nRBC 0.0 0.0 - 0.2 %    Comment: Performed at [facility], [place] [address] [place], [place] [address]  Comprehensive metabolic panel     Status: Abnormal   Collection Time: 07/31/22  2:57 AM  Result Value Ref Range   Sodium 136 135 - 145 mmol/L   Potassium 3.8 3.5 - 5.1 mmol/L   Chloride 102 98 - 111 mmol/L   CO2 20 (L) 22 - 32 mmol/L   Glucose, Bld 188 (H) 70 - 99 mg/dL     Comment: Glucose reference range applies only to samples taken after fasting for at least 8 hours.   BUN 23 8 - 23 mg/dL   Creatinine, Ser 1.16 0.61 - 1.24 mg/dL   Calcium 8.8 (L) 8.9 - 10.3 mg/dL   Total Protein 5.5 (L) 6.5 - 8.1 g/dL   Albumin 2.8 (L) 3.5 - 5.0 g/dL   AST 24 15 - 41 U/L   ALT 23 0 - 44 U/L   Alkaline Phosphatase 59 38 - 126 U/L   Total Bilirubin 0.9 0.3 - 1.2 mg/dL   GFR, Estimated >60 >60 mL/min    Comment: (NOTE) Calculated using the CKD-EPI Creatinine Equation (2021)    Anion gap 14 5 - 15    Comment: Performed at [facility], [place] [address] [place], [place] [address]  Lactic acid, plasma     Status: Abnormal   Collection Time: 07/31/22  2:57 AM  Result Value Ref Range   Lactic Acid, Venous 4.9 (HH) 0.5 - 1.9 mmol/L    Comment: CRITICAL RESULT CALLED TO, READ BACK BY AND VERIFIED WITH A.LUECKE, RN [phone] 08.04.23 MRIVET Performed at [facility], [place] [address], [place], [place] [address]   C-reactive protein     Status: Abnormal   Collection Time: 07/31/22  2:57 AM  Result Value Ref Range   CRP 5.1 (H) <1.0 mg/dL    Comment: Performed at [facility] [address] [place], [place] [address]  Procalcitonin - Baseline  Status: None   Collection Time: 07/31/22  2:57 AM  Result Value Ref Range   Procalcitonin 4.25 ng/mL    Comment:        Interpretation: PCT > 2 ng/mL: Systemic infection (sepsis) is likely, unless other causes are known. (NOTE)       Sepsis PCT Algorithm           Lower Respiratory Tract                                      Infection PCT Algorithm    ----------------------------     ----------------------------         PCT < 0.25 ng/mL                PCT < 0.10 ng/mL          Strongly encourage             Strongly discourage   discontinuation of antibiotics    initiation of antibiotics    ----------------------------     -----------------------------       PCT 0.25 - 0.50 ng/mL            PCT 0.10 - 0.25  ng/mL               OR       >80% decrease in PCT            Discourage initiation of                                            antibiotics      Encourage discontinuation           of antibiotics    ----------------------------     -----------------------------         PCT >= 0.50 ng/mL              PCT 0.26 - 0.50 ng/mL               AND       <80% decrease in PCT              Encourage initiation of                                             antibiotics       Encourage continuation           of antibiotics    ----------------------------     -----------------------------        PCT >= 0.50 ng/mL                  PCT > 0.50 ng/mL               AND         increase in PCT                  Strongly encourage                                      initiation of antibiotics    Strongly encourage  escalation           of antibiotics                                     -----------------------------                                           PCT <= 0.25 ng/mL                                                 OR                                        > 80% decrease in PCT                                      Discontinue / Do not initiate                                             antibiotics  Performed at [facility], [place] [address] [place], [place] [address]   CBG monitoring, ED     Status: Abnormal   Collection Time: 07/31/22  3:41 AM  Result Value Ref Range   Glucose-Capillary 171 (H) 70 - 99 mg/dL    Comment: Glucose reference range applies only to samples taken after fasting for at least 8 hours.  Lactic acid, plasma     Status: Abnormal   Collection Time: 07/31/22  5:28 AM  Result Value Ref Range   Lactic Acid, Venous 6.2 (HH) 0.5 - 1.9 mmol/L    Comment: CRITICAL VALUE NOTED. VALUE IS CONSISTENT WITH PREVIOUSLY REPORTED/CALLED VALUE Performed at [facility], [place] [address] [place], [place] [address]   CBG monitoring, ED     Status: Abnormal   Collection Time:  07/31/22  6:41 AM  Result Value Ref Range   Glucose-Capillary 166 (H) 70 - 99 mg/dL    Comment: Glucose reference range applies only to samples taken after fasting for at least 8 hours.  CBG monitoring, ED     Status: Abnormal   Collection Time: 07/31/22  8:18 AM  Result Value Ref Range   Glucose-Capillary 170 (H) 70 - 99 mg/dL    Comment: Glucose reference range applies only to samples taken after fasting for at least 8 hours.  CBG monitoring, ED     Status: Abnormal   Collection Time: 07/31/22 11:32 AM  Result Value Ref Range   Glucose-Capillary 137 (H) 70 - 99 mg/dL    Comment: Glucose reference range applies only to samples taken after fasting for at least 8 hours.   CT ABDOMEN PELVIS W CONTRAST  Result Date: 07/30/2022 CLINICAL DATA:  Abdominal pain, post ERCP EXAM: CT ABDOMEN AND PELVIS WITH CONTRAST TECHNIQUE: Multidetector CT imaging of the abdomen and pelvis was performed using the standard protocol following bolus administration  of intravenous contrast. RADIATION DOSE REDUCTION: This exam was performed according to the departmental dose-optimization program which includes automated exposure control, adjustment of the mA and/or kV according to patient size and/or use of iterative reconstruction technique. CONTRAST:  63m OMNIPAQUE IOHEXOL 300 MG/ML  SOLN COMPARISON:  CT 06/23/2022 FINDINGS: Lower chest: No acute abnormality. Right basilar scarring/atelectasis. Hepatobiliary: Focal round hyperdensities in the hepatic hilum are presumed cholecystectomy clips (series 3/image 21). Pneumobilia likely from ERCP earlier today. The common bile duct is dilated measuring 1.3 cm in diameter and demonstrates mucosal hyperenhancement. Pancreas: Acute peripancreatic fluid about the head of the pancreas. 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 No pancreatic ductal dilation. Spleen: Normal in size without focal abnormality. Adrenals/Urinary Tract: Adrenal  glands are unremarkable. Kidneys are normal, without renal calculi, focal lesion, or hydronephrosis. Bladder is unremarkable. Stomach/Bowel: Moderate hiatal hernia. Stomach is unremarkable. Wall thickening and hyperenhancement of the duodenum. Multiple duodenal diverticula. No definite duodenal perforation. Normal caliber large and small bowel. The appendix is not definitely seen. Vascular/Lymphatic: Mixed density aortic atherosclerotic plaque. Redemonstrated infrarenal abdominal aortic aneurysm measuring 4.0 cm. No suspicious abdominal or pelvic lymphadenopathy. Reproductive: Enlarged prostate. Other: Focal hyperdensity measuring 5 mm within the right pericolic gutter (series 3/image 38) is indeterminate but may represent a dropped gallstone. There is adjacent fluid and stranding. Musculoskeletal: Unchanged lytic lesion in the left iliac wing. IMPRESSION: 1. Findings compatible with post ERCP pancreatitis. There is poor enhancement of the pancreatic head concerning for developing pancreatic necrosis. 2. Wall thickening and hyperenhancement of the duodenum is likely reactive secondary to pancreatitis. No definite perforation. 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 3. New hyperdensity within the right pericolic gutter with surrounding fluid and inflammatory change is indeterminate but concerning for a dropped gallstone. 4. Infrarenal abdominal aortic aneurysm measuring 4 mm is unchanged from 06/23/2022. Recommend follow-up every 12 months and vascular consultation. Critical Value/emergent results were called by telephone at the time of interpretation on 07/30/2022 at 9:45 pm to provider SSherwood Gambler, who verbally acknowledged these results. Electronically Signed   By: TPlacido SouM.D.   On: 07/30/2022 22:00    Pending Labs Unresulted Labs (From admission, onward)     Start     Ordered   07/31/22 0900  CBC  Tomorrow morning,   R        07/31/22 0235            Vitals/Pain Today's Vitals    07/31/22 1259 07/31/22 1330 07/31/22 1400 07/31/22 1409  BP:  (!) 96/57 (!) 104/53   Pulse:  97 96   Resp:  15 17   Temp:    98.1 F (36.7 C)  TempSrc:      SpO2:  93% 93%   Weight:      Height:      PainSc: 5        Isolation Precautions No active isolations  Medications Medications  HYDROmorphone (DILAUDID) injection 1 mg (1 mg Intravenous Given 07/31/22 1227)  Tiotropium Bromide Monohydrate AERS 2 puff (has no administration in time range)  insulin aspart (novoLOG) injection 0-6 Units ( Subcutaneous Not Given 07/31/22 1134)  heparin injection 5,000 Units (5,000 Units Subcutaneous Given 07/31/22 1407)  acetaminophen (TYLENOL) tablet 650 mg (has no administration in time range)    Or  acetaminophen (TYLENOL) suppository 650 mg (has no administration in time range)  ondansetron (ZOFRAN) tablet 4 mg (has no administration in time range)    Or  ondansetron (ZOFRAN) injection  4 mg (has no administration in time range)  meropenem (MERREM) 1 g in sodium chloride 0.9 % 100 mL IVPB (0 g Intravenous Stopped 07/31/22 1200)  morphine (PF) 2 MG/ML injection 1 mg (has no administration in time range)  oxyCODONE-acetaminophen (PERCOCET/ROXICET) 5-325 MG per tablet 1 tablet (1 tablet Oral Not Given 07/31/22 0756)  lactated ringers infusion ( Intravenous Rate/Dose Change 07/31/22 1258)  morphine (PF) 4 MG/ML injection 4 mg (4 mg Intravenous Given 07/30/22 2145)  iohexol (OMNIPAQUE) 300 MG/ML solution 85 mL (85 mLs Intravenous Contrast Given 07/30/22 2127)  fentaNYL (SUBLIMAZE) injection 50 mcg (50 mcg Intravenous Given 07/30/22 2352)  lactated ringers bolus 1,000 mL (0 mLs Intravenous Stopped 07/31/22 0753)    Mobility walks with device Moderate fall risk   Focused Assessments    R Recommendations: See Admitting Provider Note  Report given to:   Additional Notes:

## 2022-07-31 NOTE — ED Notes (Signed)
Dr. Alanda Amass at bedside

## 2022-07-31 NOTE — ED Notes (Signed)
Oncoimg RN made aware of situation and MD response will have e to refer to oncoming nurse about situation

## 2022-08-01 ENCOUNTER — Inpatient Hospital Stay (HOSPITAL_COMMUNITY): Payer: Medicare HMO

## 2022-08-01 DIAGNOSIS — I480 Paroxysmal atrial fibrillation: Secondary | ICD-10-CM | POA: Diagnosis not present

## 2022-08-01 DIAGNOSIS — R0602 Shortness of breath: Secondary | ICD-10-CM | POA: Diagnosis not present

## 2022-08-01 DIAGNOSIS — E1169 Type 2 diabetes mellitus with other specified complication: Secondary | ICD-10-CM | POA: Diagnosis not present

## 2022-08-01 DIAGNOSIS — R932 Abnormal findings on diagnostic imaging of liver and biliary tract: Secondary | ICD-10-CM | POA: Diagnosis not present

## 2022-08-01 DIAGNOSIS — I10 Essential (primary) hypertension: Secondary | ICD-10-CM

## 2022-08-01 DIAGNOSIS — K859 Acute pancreatitis without necrosis or infection, unspecified: Secondary | ICD-10-CM | POA: Diagnosis not present

## 2022-08-01 DIAGNOSIS — K9189 Other postprocedural complications and disorders of digestive system: Secondary | ICD-10-CM

## 2022-08-01 DIAGNOSIS — R1011 Right upper quadrant pain: Secondary | ICD-10-CM | POA: Diagnosis not present

## 2022-08-01 DIAGNOSIS — R1084 Generalized abdominal pain: Secondary | ICD-10-CM | POA: Diagnosis not present

## 2022-08-01 DIAGNOSIS — R1013 Epigastric pain: Secondary | ICD-10-CM | POA: Diagnosis not present

## 2022-08-01 LAB — CBC WITH DIFFERENTIAL/PLATELET
Abs Immature Granulocytes: 0.4 10*3/uL — ABNORMAL HIGH (ref 0.00–0.07)
Band Neutrophils: 39 %
Basophils Absolute: 0 10*3/uL (ref 0.0–0.1)
Basophils Relative: 0 %
Eosinophils Absolute: 0 10*3/uL (ref 0.0–0.5)
Eosinophils Relative: 0 %
HCT: 38.3 % — ABNORMAL LOW (ref 39.0–52.0)
Hemoglobin: 12.8 g/dL — ABNORMAL LOW (ref 13.0–17.0)
Lymphocytes Relative: 4 %
Lymphs Abs: 0.5 10*3/uL — ABNORMAL LOW (ref 0.7–4.0)
MCH: 30.3 pg (ref 26.0–34.0)
MCHC: 33.4 g/dL (ref 30.0–36.0)
MCV: 90.8 fL (ref 80.0–100.0)
Metamyelocytes Relative: 1 %
Monocytes Absolute: 0.3 10*3/uL (ref 0.1–1.0)
Monocytes Relative: 2 %
Myelocytes: 2 %
Neutro Abs: 11.6 10*3/uL — ABNORMAL HIGH (ref 1.7–7.7)
Neutrophils Relative %: 52 %
Platelets: 215 10*3/uL (ref 150–400)
RBC: 4.22 MIL/uL (ref 4.22–5.81)
RDW: 15.6 % — ABNORMAL HIGH (ref 11.5–15.5)
WBC: 12.7 10*3/uL — ABNORMAL HIGH (ref 4.0–10.5)
nRBC: 0 % (ref 0.0–0.2)

## 2022-08-01 LAB — GLUCOSE, CAPILLARY
Glucose-Capillary: 121 mg/dL — ABNORMAL HIGH (ref 70–99)
Glucose-Capillary: 103 mg/dL — ABNORMAL HIGH (ref 70–99)
Glucose-Capillary: 94 mg/dL (ref 70–99)
Glucose-Capillary: 118 mg/dL — ABNORMAL HIGH (ref 70–99)
Glucose-Capillary: 122 mg/dL — ABNORMAL HIGH (ref 70–99)
Glucose-Capillary: 116 mg/dL — ABNORMAL HIGH (ref 70–99)
Glucose-Capillary: 126 mg/dL — ABNORMAL HIGH (ref 70–99)

## 2022-08-01 LAB — COMPREHENSIVE METABOLIC PANEL
ALT: 23 U/L (ref 0–44)
AST: 22 U/L (ref 15–41)
Albumin: 2.4 g/dL — ABNORMAL LOW (ref 3.5–5.0)
Alkaline Phosphatase: 53 U/L (ref 38–126)
Anion gap: 11 (ref 5–15)
BUN: 28 mg/dL — ABNORMAL HIGH (ref 8–23)
CO2: 26 mmol/L (ref 22–32)
Calcium: 8.4 mg/dL — ABNORMAL LOW (ref 8.9–10.3)
Chloride: 102 mmol/L (ref 98–111)
Creatinine, Ser: 1.09 mg/dL (ref 0.61–1.24)
GFR, Estimated: >60 mL/min (ref >60)
Glucose, Bld: 110 mg/dL — ABNORMAL HIGH (ref 70–99)
Potassium: 3.9 mmol/L (ref 3.5–5.1)
Sodium: 139 mmol/L (ref 135–145)
Total Bilirubin: 1.1 mg/dL (ref 0.3–1.2)
Total Protein: 5.2 g/dL — ABNORMAL LOW (ref 6.5–8.1)

## 2022-08-01 LAB — APTT: aPTT: 121 seconds — ABNORMAL HIGH (ref 24–36)

## 2022-08-01 LAB — HEPARIN LEVEL (UNFRACTIONATED): Heparin Unfractionated: >1.1 IU/mL — ABNORMAL HIGH (ref 0.30–0.70)

## 2022-08-01 LAB — TROPONIN I (HIGH SENSITIVITY): Troponin I (High Sensitivity): 78 ng/L — ABNORMAL HIGH (ref <18)

## 2022-08-01 MED ORDER — HEPARIN (PORCINE) 25000 UT/250ML-% IV SOLN
1300.0000 [IU]/h | INTRAVENOUS | Status: AC
  Administered 2022-08-01: 1200 [IU]/h via INTRAVENOUS
  Administered 2022-08-02 – 2022-08-05 (×5): 1050 [IU]/h via INTRAVENOUS
  Administered 2022-08-07: 1250 [IU]/h via INTRAVENOUS
  Administered 2022-08-07: 1150 [IU]/h via INTRAVENOUS
  Administered 2022-08-08 – 2022-08-09 (×2): 1250 [IU]/h via INTRAVENOUS
  Administered 2022-08-10: 1400 [IU]/h via INTRAVENOUS
  Administered 2022-08-11 – 2022-08-19 (×10): 1500 [IU]/h via INTRAVENOUS
  Administered 2022-08-20: 1600 [IU]/h via INTRAVENOUS
  Administered 2022-08-20: 1500 [IU]/h via INTRAVENOUS
  Administered 2022-08-21 – 2022-08-24 (×5): 1600 [IU]/h via INTRAVENOUS
  Administered 2022-08-24 – 2022-08-25 (×2): 1450 [IU]/h via INTRAVENOUS
  Administered 2022-08-27: 1300 [IU]/h via INTRAVENOUS
  Filled 2022-08-01 (×36): qty 250

## 2022-08-01 MED ORDER — HYDROMORPHONE HCL 1 MG/ML IJ SOLN
1.0000 mg | INTRAMUSCULAR | Status: DC | PRN
  Administered 2022-08-01 – 2022-08-05 (×16): 1 mg via INTRAVENOUS
  Filled 2022-08-01 (×16): qty 1

## 2022-08-01 MED ORDER — AMIODARONE HCL IN DEXTROSE 360-4.14 MG/200ML-% IV SOLN
30.0000 mg/h | INTRAVENOUS | Status: DC
Start: 2022-08-01 — End: 2022-08-03
  Administered 2022-08-01 – 2022-08-02 (×3): 30 mg/h via INTRAVENOUS
  Filled 2022-08-01 (×4): qty 200

## 2022-08-01 MED ORDER — METOPROLOL TARTRATE 25 MG PO TABS
25.0000 mg | ORAL_TABLET | Freq: Three times a day (TID) | ORAL | Status: DC
  Filled 2022-08-01: qty 1

## 2022-08-01 MED ORDER — AMIODARONE HCL IN DEXTROSE 360-4.14 MG/200ML-% IV SOLN
60.0000 mg/h | INTRAVENOUS | Status: AC
  Administered 2022-08-01: 60 mg/h via INTRAVENOUS
  Filled 2022-08-01: qty 200

## 2022-08-01 MED ORDER — METOPROLOL TARTRATE 5 MG/5ML IV SOLN
5.0000 mg | Freq: Once | INTRAVENOUS | Status: AC
  Administered 2022-08-01: 5 mg via INTRAVENOUS
  Filled 2022-08-01: qty 5

## 2022-08-01 MED ORDER — DILTIAZEM HCL 25 MG/5ML IV SOLN
10.0000 mg | Freq: Once | INTRAVENOUS | Status: DC
  Filled 2022-08-01: qty 5

## 2022-08-01 MED ORDER — IOHEXOL 300 MG/ML  SOLN
100.0000 mL | Freq: Once | INTRAMUSCULAR | Status: AC | PRN
  Administered 2022-08-01: 100 mL via INTRAVENOUS

## 2022-08-01 MED ORDER — AMIODARONE LOAD VIA INFUSION
150.0000 mg | Freq: Once | INTRAVENOUS | Status: AC
  Administered 2022-08-01: 150 mg via INTRAVENOUS
  Filled 2022-08-01: qty 83.34

## 2022-08-01 NOTE — Progress Notes (Addendum)
PROGRESS NOTE        PATIENT DETAILS Name: Douglas Hurst. Age: 81 y.o. Sex: male Date of Birth: 02-Jul-1941 Admit Date: 07/30/2022 Admitting Physician Clance Boll, MD BTD:[id number], [name], MD  Brief Summary: Patient is a 81 y.o.  male with history of PAF on Eliquis, HTN, HLD, COPD, chronic HFrEF (recovered EF)-with recent history of cholecystitis-s/p cholecystectomy but with retained CBD stone.  ERCP here was unsuccessful.  Patient was referred to [facility] underwent advanced endoscopy via transduodenal approach for ERCP on 8/3 at [facility] with Dr. Okey Dupre -he subsequently presented to the ED with severe abdominal pain-was found to have pancreatitis and admitted to the hospitalist service.  Significant events: 8/3>> admit to [facility]-abdominal pain after ERCP at [facility]-found to have acute pancreatitis 8/5>> A-fib RVR-no response to IV Lopressor-blood pressure soft-amiodarone infusion started.  After discussion with GI-heparin started without bolus.  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  Significant microbiology data:   Procedures:   Consults: GI Phone consult-cards-Dr Crenshaw  Subjective: Lying comfortably in bed-denies any chest pain or shortness of breath.Has ongoing epigastric pain  Objective: Vitals: Blood pressure (!) 79/49, pulse (!) 116, temperature 98.1 F (36.7 C), temperature source Oral, resp. rate 20, height '6\' 1"'$  (1.854 m), weight 80.7 kg, SpO2 95 %.   Exam: Gen Exam:Alert awake-not in any distress HEENT:atraumatic, normocephalic Chest: B/L clear to auscultation anteriorly CVS:S1S2 irregular Abdomen:soft -tender in the upper abdomen without any peritoneal signs. Extremities:no edema Neurology: Non focal Skin: no rash  Pertinent Labs/Radiology:    Latest Ref Rng & Units 08/01/2022     7:17 AM 07/31/2022    2:57 AM 07/30/2022    8:48 PM  CBC  WBC 4.0 - 10.5 K/uL 12.7  6.3  9.1   Hemoglobin 13.0 - 17.0 g/dL 12.8  13.0  13.7   Hematocrit 39.0 - 52.0 % 38.3  39.4  43.7   Platelets 150 - 400 K/uL 215  248  298     Lab Results  Component Value Date   NA 139 08/01/2022   K 3.9 08/01/2022   CL 102 08/01/2022   CO2 26 08/01/2022      Assessment/Plan: Acute pancreatitis (possibly necrotizing)-duodenitis with contained duodenal perforation (transduodenal approach to puncture extrahepatic bile duct to place guidewire into duodenum-subsequently ERCP scope inserted and stone removed): Continues to have epigastric pain-remains on empiric meropenem-Per GI-okay to advance to clear liquids.  GI contemplating repeating CT imaging tomorrow.  PAF with RVR: Provoked by pancreatitis/pain/acute illness-given 1 dose of IV Lopressor-minimal change in heart rate-but now blood pressure dropping/soft-discussed with cardiology-Dr. Stanford Breed over the phone-we will start amiodarone infusion (on oral amiodarone at home).  After discussion with GI-start heparin infusion without bolus (Eliquis held since 7/31 for ERCP).  Continue to monitor closely on telemetry.  When blood pressure more acceptable-we will start oral metoprolol.  AKI: Resolved-likely hemodynamically mediated.  Chronic HFrEF with recovered EF: Some mild shortness of breath-but also anxious-no obvious signs of volume overload-monitor closely while on IVF for pancreatitis.    DM-2: CBGs stable with SSI.  Resume metformin when closer to discharge.  Recent Labs    08/01/22 0213 08/01/22 0505 08/01/22 0759  GLUCAP 121* 103* 94    HTN: BP soft-continue to hold all antihypertensives.  BPH:  Resume Flomax when BP more stable.  COPD: Continue bronchodilators  Infrarenal abdominal aortic aneurysm 4 mm: Unchanged from prior study-annual follow-up is required.  BMI: Estimated body mass index is 23.48 kg/m as calculated from the  following:   Height as of this encounter: '6\' 1"'$  (1.854 m).   Weight as of this encounter: 80.7 kg.   Code status:   Code Status: Full Code   DVT Prophylaxis:IV heparin    Family Communication: Spousee at bedside   Disposition Plan: Status is: Inpatient Remains inpatient appropriate because: Pancreatitis-A-fib RVR-not yet stable for discharge.   Planned Discharge Destination:Home   Diet: Diet Order             Diet clear liquid Room service appropriate? Yes; Fluid consistency: Thin  Diet effective now                     Antimicrobial agents: Anti-infectives (From admission, onward)    Start     Dose/Rate Route Frequency Ordered Stop   07/31/22 0400  meropenem (MERREM) 1 g in sodium chloride 0.9 % 100 mL IVPB        1 g 200 mL/hr over 30 Minutes Intravenous Every 8 hours 07/31/22 0252     07/30/22 2245  piperacillin-tazobactam (ZOSYN) IVPB 3.375 g  Status:  Discontinued        3.375 g 12.5 mL/hr over 240 Minutes Intravenous Every 8 hours 07/30/22 2239 07/31/22 0239        MEDICATIONS: Scheduled Meds:  amiodarone  150 mg Intravenous Once   diltiazem  10 mg Intravenous Once   insulin aspart  0-6 Units Subcutaneous Q4H   metoprolol tartrate  25 mg Oral TID   Continuous Infusions:  amiodarone     Followed by   amiodarone     heparin 1,200 Units/hr (08/01/22 1027)   lactated ringers 150 mL/hr at 08/01/22 1017   meropenem (MERREM) IV 1 g (08/01/22 0515)   PRN Meds:.acetaminophen **OR** acetaminophen, HYDROmorphone (DILAUDID) injection, morphine injection, ondansetron **OR** ondansetron (ZOFRAN) IV, oxyCODONE-acetaminophen, umeclidinium bromide   I have personally reviewed following labs and imaging studies  LABORATORY DATA: CBC: Recent Labs  Lab 07/30/22 2048 07/31/22 0257 08/01/22 0717  WBC 9.1 6.3 12.7*  NEUTROABS 8.2*  --  PENDING  HGB 13.7 13.0 12.8*  HCT 43.7 39.4 38.3*  MCV 95.4 92.1 90.8  PLT 298 248 638    Basic Metabolic  Panel: Recent Labs  Lab 07/30/22 2048 07/31/22 0257 08/01/22 0717  NA 138 136 139  K 4.1 3.8 3.9  CL 99 102 102  CO2 22 20* 26  GLUCOSE 203* 188* 110*  BUN 23 23 28*  CREATININE 1.30* 1.16 1.09  CALCIUM 9.3 8.8* 8.4*    GFR: Estimated Creatinine Clearance: 62.1 mL/min (by C-G formula based on SCr of 1.09 mg/dL).  Liver Function Tests: Recent Labs  Lab 07/30/22 2048 07/31/22 0257 08/01/22 0717  AST '26 24 22  '$ ALT '26 23 23  '$ ALKPHOS 68 59 53  BILITOT 1.3* 0.9 1.1  PROT 6.3* 5.5* 5.2*  ALBUMIN 3.3* 2.8* 2.4*   Recent Labs  Lab 07/30/22 2048  LIPASE 21   No results for input(s): "AMMONIA" in the last 168 hours.  Coagulation Profile: No results for input(s): "INR", "PROTIME" in the last 168 hours.  Cardiac Enzymes: No results for input(s): "CKTOTAL", "CKMB", "CKMBINDEX", "TROPONINI" in the last 168 hours.  BNP (last 3 results) No results for input(s): "PROBNP" in the last 8760 hours.  Lipid Profile:  No results for input(s): "CHOL", "HDL", "LDLCALC", "TRIG", "CHOLHDL", "LDLDIRECT" in the last 72 hours.  Thyroid Function Tests: No results for input(s): "TSH", "T4TOTAL", "FREET4", "T3FREE", "THYROIDAB" in the last 72 hours.  Anemia Panel: No results for input(s): "VITAMINB12", "FOLATE", "FERRITIN", "TIBC", "IRON", "RETICCTPCT" in the last 72 hours.  Urine analysis:    Component Value Date/Time   COLORURINE YELLOW 04/25/2022 1232   APPEARANCEUR CLEAR 04/25/2022 1232   LABSPEC <1.005 (L) 04/25/2022 1232   PHURINE 6.0 04/25/2022 1232   GLUCOSEU 100 (A) 04/25/2022 1232   HGBUR TRACE (A) 04/25/2022 1232   BILIRUBINUR NEGATIVE 04/25/2022 1232   KETONESUR NEGATIVE 04/25/2022 1232   PROTEINUR NEGATIVE 04/25/2022 1232   UROBILINOGEN 4.0 (H) 10/02/2015 2135   NITRITE POSITIVE (A) 04/25/2022 1232   LEUKOCYTESUR SMALL (A) 04/25/2022 1232    Sepsis Labs: Lactic Acid, Venous    Component Value Date/Time   LATICACIDVEN 6.2 (HH) 07/31/2022 0528     MICROBIOLOGY: No results found for this or any previous visit (from the past 240 hour(s)).  RADIOLOGY STUDIES/RESULTS: CT ABDOMEN PELVIS W CONTRAST  Result Date: 07/30/2022 CLINICAL DATA:  Abdominal pain, post ERCP EXAM: CT ABDOMEN AND PELVIS WITH CONTRAST TECHNIQUE: Multidetector CT imaging of the abdomen and pelvis was performed using the standard protocol following bolus administration of intravenous contrast. RADIATION DOSE REDUCTION: This exam was performed according to the departmental dose-optimization program which includes automated exposure control, adjustment of the mA and/or kV according to patient size and/or use of iterative reconstruction technique. CONTRAST:  78m OMNIPAQUE IOHEXOL 300 MG/ML  SOLN COMPARISON:  CT 06/23/2022 FINDINGS: Lower chest: No acute abnormality. Right basilar scarring/atelectasis. Hepatobiliary: Focal round hyperdensities in the hepatic hilum are presumed cholecystectomy clips (series 3/image 21). Pneumobilia likely from ERCP earlier today. The common bile duct is dilated measuring 1.3 cm in diameter and demonstrates mucosal hyperenhancement. Pancreas: Acute peripancreatic fluid about the head of the pancreas. 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 No pancreatic ductal dilation. Spleen: Normal in size without focal abnormality. Adrenals/Urinary Tract: Adrenal glands are unremarkable. Kidneys are normal, without renal calculi, focal lesion, or hydronephrosis. Bladder is unremarkable. Stomach/Bowel: Moderate hiatal hernia. Stomach is unremarkable. Wall thickening and hyperenhancement of the duodenum. Multiple duodenal diverticula. No definite duodenal perforation. Normal caliber large and small bowel. The appendix is not definitely seen. Vascular/Lymphatic: Mixed density aortic atherosclerotic plaque. Redemonstrated infrarenal abdominal aortic aneurysm measuring 4.0 cm. No suspicious abdominal or pelvic  lymphadenopathy. Reproductive: Enlarged prostate. Other: Focal hyperdensity measuring 5 mm within the right pericolic gutter (series 3/image 38) is indeterminate but may represent a dropped gallstone. There is adjacent fluid and stranding. Musculoskeletal: Unchanged lytic lesion in the left iliac wing. IMPRESSION: 1. Findings compatible with post ERCP pancreatitis. There is poor enhancement of the pancreatic head concerning for developing pancreatic necrosis. 2. Wall thickening and hyperenhancement of the duodenum is likely reactive secondary to pancreatitis. No definite perforation. 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 3. New hyperdensity within the right pericolic gutter with surrounding fluid and inflammatory change is indeterminate but concerning for a dropped gallstone. 4. Infrarenal abdominal aortic aneurysm measuring 4 mm is unchanged from 06/23/2022. Recommend follow-up every 12 months and vascular consultation. Critical Value/emergent results were called by telephone at the time of interpretation on 07/30/2022 at 9:45 pm to provider SSherwood Gambler, who verbally acknowledged these results. Electronically Signed   By: TPlacido SouM.D.   On: 07/30/2022 22:00     LOS: 1 day   SOren Binet MD  [facility]    To contact the attending provider between 7A-7P or the covering provider during after hours 7P-7A, please log into the web site [email or web address] and access using universal [place] password for that web site. If you do not have the password, please call the hospital operator.  08/01/2022, 10:48 AM

## 2022-08-01 NOTE — Progress Notes (Addendum)
  Amiodarone Drug - Drug Interaction Consult Note  Recommendations: Reasonable to initiate amiodarone therapy. No significant drug interactions noted.  Amiodarone is metabolized by the cytochrome P450 system and therefore has the potential to cause many drug interactions. Amiodarone has an average plasma half-life of 50 days (range 20 to 100 days).   There is potential for drug interactions to occur several weeks or months after stopping treatment and the onset of drug interactions may be slow after initiating amiodarone.   '[]'$  Statins: Increased risk of myopathy. Simvastatin- restrict dose to '20mg'$  daily. Other statins: counsel patients to report any muscle pain or weakness immediately.  '[]'$  Anticoagulants: Amiodarone can increase anticoagulant effect. Consider warfarin dose reduction. Patients should be monitored closely and the dose of anticoagulant altered accordingly, remembering that amiodarone levels take several weeks to stabilize.    '[]'$  Antiepileptics: Amiodarone can increase plasma concentration of phenytoin, the dose should be reduced. Note that small changes in phenytoin dose can result in large changes in levels. Monitor patient and counsel on signs of toxicity.  '[]'$  Beta blockers: increased risk of bradycardia, AV block and myocardial depression. Sotalol - avoid concomitant use.  '[]'$   Calcium channel blockers (diltiazem and verapamil): increased risk of bradycardia, AV block and myocardial depression.  '[]'$   Cyclosporine: Amiodarone increases levels of cyclosporine. Reduced dose of cyclosporine is recommended.  '[]'$  Digoxin dose should be halved when amiodarone is started.  '[]'$  Diuretics: increased risk of cardiotoxicity if hypokalemia occurs.  '[]'$  Oral hypoglycemic agents (glyburide, glipizide, glimepiride): increased risk of hypoglycemia. Patient's glucose levels should be monitored closely when initiating amiodarone therapy.   '[]'$  Drugs that prolong the QT interval:  Torsades de  pointes risk may be increased with concurrent use - avoid if possible.  Monitor QTc, also keep magnesium/potassium WNL if concurrent therapy can't be avoided.  Antibiotics: e.g. fluoroquinolones, erythromycin.  Antiarrhythmics: e.g. quinidine, procainamide, disopyramide, sotalol.  Antipsychotics: e.g. phenothiazines, haloperidol.   Lithium, tricyclic antidepressants, and methadone.  Thank You,  Vicenta Dunning, PharmD  PGY1 Pharmacy Resident  -------------------------------------------------------------------------------------------------------------------- I discussed / reviewed the pharmacy note by Dr. Kara Mead and I agree with the resident's findings and plans as documented. Vaughan Basta BS, PharmD, BCPS Clinical Pharmacist 08/01/2022 11:09 AM  Contact: [phone] after 3 PM  "Be curious, not judgmental..." -Jamal Maes --------------------------------------------------------------------------------------------------------------------

## 2022-08-01 NOTE — Progress Notes (Addendum)
ANTICOAGULATION CONSULT NOTE - Initial Consult  Pharmacy Consult for heparin infusion Indication: atrial fibrillation  No Known Allergies  Patient Measurements: Height: '6\' 1"'$  (185.4 cm) Weight: 80.7 kg (178 lb) IBW/kg (Calculated) : 79.9 Heparin Dosing Weight: 80.7  Vital Signs: Temp: 98.1 F (36.7 C) (08/05 0500) Temp Source: Oral (08/05 0500) BP: 123/70 (08/05 0958) Pulse Rate: 106 (08/05 0800)  Labs: Recent Labs    07/30/22 2048 07/31/22 0257 08/01/22 0717  HGB 13.7 13.0 12.8*  HCT 43.7 39.4 38.3*  PLT 298 248 215  CREATININE 1.30* 1.16 1.09    Estimated Creatinine Clearance: 62.1 mL/min (by C-G formula based on SCr of 1.09 mg/dL).   Medical History: Past Medical History:  Diagnosis Date   Bipolar disorder ([place])    Chronic back pain    Complication of anesthesia    prolonged sedation and confusion   COPD (chronic obstructive pulmonary disease) (HCC)    Dr. Laurann Montana   Diabetes mellitus without complication ([place])    type 2   Diverticulosis    Dysrhythmia    Afib  onset 03-2022   ED (erectile dysfunction)    Gallstones    GERD (gastroesophageal reflux disease)    Hiatal hernia    Hyperlipidemia    Hypertension    Wears dentures    upper   Wears glasses    Wears hearing aid in both ears     Medications:  Infusions:   lactated ringers 125 mL/hr at 07/31/22 1258   meropenem (MERREM) IV 1 g (08/01/22 0515)    Assessment: Douglas Hurst is a 81 y/o male whose pertinent medication history includes Afib and CHF. Pharmacy has been consulted to initiate a heparin infusion, no bolus per MD. His last dose of Eliquis was charted as 7/30. Will plan to order both heparin levels and initial aPTT to ensure correlation.   Goal of Therapy:  Heparin level 0.3-0.7 units/ml aPTT 66-102 seconds Monitor platelets by anticoagulation protocol: Yes    Plan:  No bolus Start heparin infusion at 1200 units/hr Check aPTT and Hep level in 8 hours  Check anti-Xa level in 8 hours and  daily while on heparin Continue to monitor H&H and platelets via [place], PharmD  PGY1 Pharmacy Resident  ---------------------------------------------------------------------------------------------------------- I discussed / reviewed the pharmacy note by Dr. Kara Mead and I agree with the resident's findings and plans as documented. Vaughan Basta BS, PharmD, BCPS Clinical Pharmacist 08/01/2022 10:16 AM  Contact: [phone] after 3 PM  "Be curious, not judgmental..." -Jamal Maes ----------------------------------------------------------------------------------------------------------

## 2022-08-01 NOTE — Progress Notes (Signed)
ANTICOAGULATION CONSULT NOTE - Initial Consult  Pharmacy Consult for heparin infusion Indication: atrial fibrillation  No Known Allergies  Patient Measurements: Height: '6\' 1"'$  (185.4 cm) Weight: 80.7 kg (178 lb) IBW/kg (Calculated) : 79.9 Heparin Dosing Weight: 80.7  Vital Signs: Temp: 98.1 F (36.7 C) (08/05 1553) Temp Source: Oral (08/05 1553) BP: 116/73 (08/05 1553) Pulse Rate: 101 (08/05 1553)  Labs: Recent Labs    07/30/22 2048 07/31/22 0257 08/01/22 0717 08/01/22 1832  HGB 13.7 13.0 12.8*  --   HCT 43.7 39.4 38.3*  --   PLT 298 248 215  --   APTT  --   --   --  121*  HEPARINUNFRC  --   --   --  >1.10*  CREATININE 1.30* 1.16 1.09  --      Estimated Creatinine Clearance: 62.1 mL/min (by C-G formula based on SCr of 1.09 mg/dL).   Medical History: Past Medical History:  Diagnosis Date   Bipolar disorder ([place])    Chronic back pain    Complication of anesthesia    prolonged sedation and confusion   COPD (chronic obstructive pulmonary disease) (HCC)    Dr. Laurann Montana   Diabetes mellitus without complication ([place])    type 2   Diverticulosis    Dysrhythmia    Afib  onset 03-2022   ED (erectile dysfunction)    Gallstones    GERD (gastroesophageal reflux disease)    Hiatal hernia    Hyperlipidemia    Hypertension    Wears dentures    upper   Wears glasses    Wears hearing aid in both ears     Medications:  Infusions:   amiodarone 30 mg/hr (08/01/22 1633)   heparin 1,200 Units/hr (08/01/22 1027)   lactated ringers 150 mL/hr at 08/01/22 1017   meropenem (MERREM) IV 1 g (08/01/22 1113)    Assessment: GJ is a 81 y/o male whose pertinent medication history includes Afib and CHF. Pharmacy has been consulted to initiate a heparin infusion, no bolus per MD. His last dose of Eliquis was charted as 7/30. Will plan to order both heparin levels and initial aPTT to ensure correlation.   Heparin level and PTT came back above goal. We can't tell where the draw took  place. He was on a much higher rate when he was previously here. We will decrease the rate for now and check level in AM.  Goal of Therapy:  Heparin level 0.3-0.7 units/ml aPTT 66-102 seconds Monitor platelets by anticoagulation protocol: Yes    Plan:  Decrease heparin infusion at 1050 units/hr Check aPTT and Hep in AM  Continue to monitor H&H and platelets via [place], PharmD, BCIDP, AAHIVP, CPP Infectious Disease Pharmacist 08/01/2022 8:06 PM   ----------------------------------------------------------------------------------------------------------

## 2022-08-01 NOTE — Progress Notes (Signed)
[facility] Gastroenterology Progress Note  Rockne Dearinger. 81 y.o. 1941/11/25  CC: Abdominal pain, pancreatitis/duodenitis   Subjective: Patient seen and examined at bedside.  Continues to have abdominal pain.  Denies nausea or vomiting.  Has mild shortness of breath.  ROS : Afebrile, positive for shortness of breath, negative for chest pain   Objective: Vital signs in last 24 hours: Vitals:   08/01/22 0800 08/01/22 0958  BP: 123/72 123/70  Pulse: (!) 106   Resp: 20   Temp:    SpO2: 95%     Physical Exam:  General -elderly patient.  In mild distress from pain oxygen by nasal cannula Abdomen -epigastric tenderness to palpation, abdomen is soft, bowel sounds present, no peritoneal signs Chest -difficult examination as patient was not able to take a deep breath.  Poor air entry noted Lower extremity -no edema  Lab Results: Recent Labs    07/31/22 0257 08/01/22 0717  NA 136 139  K 3.8 3.9  CL 102 102  CO2 20* 26  GLUCOSE 188* 110*  BUN 23 28*  CREATININE 1.16 1.09  CALCIUM 8.8* 8.4*   Recent Labs    07/31/22 0257 08/01/22 0717  AST 24 22  ALT 23 23  ALKPHOS 59 53  BILITOT 0.9 1.1  PROT 5.5* 5.2*  ALBUMIN 2.8* 2.4*   Recent Labs    07/30/22 2048 07/31/22 0257 08/01/22 0717  WBC 9.1 6.3 12.7*  NEUTROABS 8.2*  --  PENDING  HGB 13.7 13.0 12.8*  HCT 43.7 39.4 38.3*  MCV 95.4 92.1 90.8  PLT 298 248 215   No results for input(s): "LABPROT", "INR" in the last 72 hours.    Assessment/Plan: -Worsening abdominal pain s/p complicated ERCP at [facility] as mentioned below on 07/30/2022    CT scan shows pancreatitis involving pancreatic head as well as thickening of the duodenum.  Normal lipase.  Normal LFTs.     He underwent EUS,[place] ERCP and sphincterotomy with 2 stones removal. Regular ERCP was unsuccessful.  Dr. Okey Dupre did a transduodenal approach to puncture the extrahepatic bile duct to pass a guidewire in to the duodenum.  Subsequently ERCP scope was  inserted and cannulation was performed over the guidewire.  Largest one was around 12 mm in size.  2 stones were removed from CBD.   -History of A-fib.  Eliquis on hold  Recommendations --------------------------- -Patient continues to have abdominal pain.  Has some shortness of breath.  Blood work shows mild elevation in the BUN as well as elevation in hematocrit likely from underlying hemoconcentration. -IV hydration to 150 cc/h. -Case discussed with hospitalist Dr.Ghimire.  Patient in need for anticoagulation for his A-fib.  I think is okay to start low-dose heparin drip.  Also recommend to get chest x-ray for further evaluation of shortness of breath and to rule out pleural effusion from IV hydration. -Okay to have clear liquids diet.  GI will follow -May consider repeat CT with IV contrast tomorrow   Otis Brace MD, FACP 08/01/2022, 10:03 AM  Contact #  [phone]

## 2022-08-02 DIAGNOSIS — K8689 Other specified diseases of pancreas: Secondary | ICD-10-CM | POA: Diagnosis not present

## 2022-08-02 DIAGNOSIS — K859 Acute pancreatitis without necrosis or infection, unspecified: Secondary | ICD-10-CM | POA: Diagnosis not present

## 2022-08-02 DIAGNOSIS — R1011 Right upper quadrant pain: Secondary | ICD-10-CM | POA: Diagnosis not present

## 2022-08-02 DIAGNOSIS — R1084 Generalized abdominal pain: Secondary | ICD-10-CM | POA: Diagnosis not present

## 2022-08-02 DIAGNOSIS — K8050 Calculus of bile duct without cholangitis or cholecystitis without obstruction: Secondary | ICD-10-CM | POA: Diagnosis not present

## 2022-08-02 DIAGNOSIS — R1013 Epigastric pain: Secondary | ICD-10-CM | POA: Diagnosis not present

## 2022-08-02 DIAGNOSIS — R932 Abnormal findings on diagnostic imaging of liver and biliary tract: Secondary | ICD-10-CM | POA: Diagnosis not present

## 2022-08-02 DIAGNOSIS — N281 Cyst of kidney, acquired: Secondary | ICD-10-CM | POA: Diagnosis not present

## 2022-08-02 LAB — COMPREHENSIVE METABOLIC PANEL
ALT: 20 U/L (ref 0–44)
AST: 19 U/L (ref 15–41)
Albumin: 2.3 g/dL — ABNORMAL LOW (ref 3.5–5.0)
Alkaline Phosphatase: 72 U/L (ref 38–126)
Anion gap: 12 (ref 5–15)
BUN: 24 mg/dL — ABNORMAL HIGH (ref 8–23)
CO2: 23 mmol/L (ref 22–32)
Calcium: 8.3 mg/dL — ABNORMAL LOW (ref 8.9–10.3)
Chloride: 101 mmol/L (ref 98–111)
Creatinine, Ser: 0.98 mg/dL (ref 0.61–1.24)
GFR, Estimated: >60 mL/min (ref >60)
Glucose, Bld: 109 mg/dL — ABNORMAL HIGH (ref 70–99)
Potassium: 3.2 mmol/L — ABNORMAL LOW (ref 3.5–5.1)
Sodium: 136 mmol/L (ref 135–145)
Total Bilirubin: 1 mg/dL (ref 0.3–1.2)
Total Protein: 5.5 g/dL — ABNORMAL LOW (ref 6.5–8.1)

## 2022-08-02 LAB — GLUCOSE, CAPILLARY
Glucose-Capillary: 106 mg/dL — ABNORMAL HIGH (ref 70–99)
Glucose-Capillary: 117 mg/dL — ABNORMAL HIGH (ref 70–99)
Glucose-Capillary: 96 mg/dL (ref 70–99)
Glucose-Capillary: 85 mg/dL (ref 70–99)
Glucose-Capillary: 96 mg/dL (ref 70–99)
Glucose-Capillary: 109 mg/dL — ABNORMAL HIGH (ref 70–99)

## 2022-08-02 LAB — CBC
HCT: 38.1 % — ABNORMAL LOW (ref 39.0–52.0)
Hemoglobin: 12.7 g/dL — ABNORMAL LOW (ref 13.0–17.0)
MCH: 30 pg (ref 26.0–34.0)
MCHC: 33.3 g/dL (ref 30.0–36.0)
MCV: 90.1 fL (ref 80.0–100.0)
Platelets: 221 10*3/uL (ref 150–400)
RBC: 4.23 MIL/uL (ref 4.22–5.81)
RDW: 15.7 % — ABNORMAL HIGH (ref 11.5–15.5)
WBC: 15.3 10*3/uL — ABNORMAL HIGH (ref 4.0–10.5)
nRBC: 0 % (ref 0.0–0.2)

## 2022-08-02 LAB — HEPARIN LEVEL (UNFRACTIONATED)
Heparin Unfractionated: 0.67 IU/mL (ref 0.30–0.70)
Heparin Unfractionated: 0.41 IU/mL (ref 0.30–0.70)

## 2022-08-02 LAB — APTT: aPTT: 73 seconds — ABNORMAL HIGH (ref 24–36)

## 2022-08-02 LAB — TROPONIN I (HIGH SENSITIVITY): Troponin I (High Sensitivity): 65 ng/L — ABNORMAL HIGH (ref <18)

## 2022-08-02 MED ORDER — TAMSULOSIN HCL 0.4 MG PO CAPS
0.4000 mg | ORAL_CAPSULE | Freq: Every day | ORAL | Status: DC
  Administered 2022-08-02 – 2022-08-04 (×3): 0.4 mg via ORAL
  Filled 2022-08-02 (×3): qty 1

## 2022-08-02 MED ORDER — PIPERACILLIN-TAZOBACTAM 3.375 G IVPB
3.3750 g | Freq: Three times a day (TID) | INTRAVENOUS | Status: DC
  Administered 2022-08-02 – 2022-08-13 (×33): 3.375 g via INTRAVENOUS
  Filled 2022-08-02 (×37): qty 50

## 2022-08-02 MED ORDER — PIPERACILLIN-TAZOBACTAM 4.5 G IVPB
4.5000 g | Freq: Once | INTRAVENOUS | Status: AC
Start: 2022-08-02 — End: 2022-08-02
  Administered 2022-08-02: 4.5 g via INTRAVENOUS
  Filled 2022-08-02 (×2): qty 100

## 2022-08-02 NOTE — Progress Notes (Signed)
PROGRESS NOTE        PATIENT DETAILS Name: Douglas Hurst. Age: 81 y.o. Sex: male Date of Birth: [birth year] Admit Date: 07/30/2022 Admitting Physician Clance Boll, MD [id number], [name], MD  Brief Summary: Patient is a 81 y.o.  male with history of PAF on Eliquis, HTN, HLD, COPD, chronic HFrEF (recovered EF)-with recent history of cholecystitis-s/p cholecystectomy but with retained CBD stone.  ERCP here was unsuccessful.  Patient was referred to [facility] underwent advanced endoscopy via transduodenal approach for ERCP on 8/3 at [facility] with Dr. Okey Dupre -he subsequently presented to the ED with severe abdominal pain-was found to have pancreatitis and admitted to the hospitalist service.  Significant events: 8/3>> admit to [facility]-abdominal pain after ERCP at [facility]-found to have acute pancreatitis 8/5>> A-fib RVR-no response to IV Lopressor-blood pressure soft-amiodarone infusion started.  After discussion with GI-heparin started without bolus.  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 8/5>>Repeat urgent CT-AP overnight due to worsening pain without over changes  Consults: GI Phone consult-cards-Dr Crenshaw  Subjective: Lying comfortably in bed-denies any chest pain or shortness of breath. Increasing pelvic pain - bladder scan >400 - in/out catheter x1  Objective: Vitals: Blood pressure (!) 180/80, pulse (!) 105, temperature 97.7 F (36.5 C), temperature source Oral, resp. rate 20, height '6\' 1"'$  (1.854 m), weight 80.7 kg, SpO2 94 %.   Exam: Gen Exam:Alert awake-not in any distress HEENT:atraumatic, normocephalic Chest: B/L clear to auscultation anteriorly CVS:S1S2 irregular Abdomen:soft -tender in the upper abdomen without any peritoneal signs. Extremities:no edema Neurology: Non focal Skin: no  rash  Pertinent Labs/Radiology:    Latest Ref Rng & Units 08/02/2022    7:03 AM 08/01/2022    7:17 AM 07/31/2022    2:57 AM  CBC  WBC 4.0 - 10.5 K/uL 15.3  12.7  6.3   Hemoglobin 13.0 - 17.0 g/dL 12.7  12.8  13.0   Hematocrit 39.0 - 52.0 % 38.1  38.3  39.4   Platelets 150 - 400 K/uL 221  215  248     Lab Results  Component Value Date   NA 139 08/01/2022   K 3.9 08/01/2022   CL 102 08/01/2022   CO2 26 08/01/2022      Assessment/Plan: Acute pancreatitis (possibly necrotizing)-duodenitis with contained duodenal perforation (transduodenal approach to puncture extrahepatic bile duct to place guidewire into duodenum-subsequently ERCP scope inserted and stone removed): Continues to have epigastric pain-remains on empiric meropenem-Per GI-okay to advance to clear liquids.  GI contemplating repeating CT imaging tomorrow.  PAF with RVR: Provoked by pancreatitis/pain/acute illness-given 1 dose of IV Lopressor-minimal change in heart rate-but now blood pressure dropping/soft-discussed with cardiology-Dr. Stanford Breed over the phone-we will start amiodarone infusion (on oral amiodarone at home).  After discussion with GI-start heparin infusion without bolus (Eliquis held since 7/31 for ERCP).  Continue to monitor closely on telemetry.  When blood pressure more acceptable-we will start oral metoprolol.  AKI, resolved Complicated by BPH and lower urinary tract obstruction  Resolved-likely hemodynamically mediated. Resume flomax due to worsening symptoms  Chronic HFrEF with recovered EF: Some mild shortness of breath-but also anxious-no obvious signs of volume overload-monitor closely while on IVF for pancreatitis.    DM-2: CBGs stable with SSI.  Resume metformin when closer to discharge.  HTN:  BP soft-continue to hold all antihypertensives.  COPD: Continue bronchodilators  Infrarenal abdominal aortic aneurysm 4 mm: Unchanged from prior study-annual follow-up is required.  BMI: Estimated body mass  index is 23.48 kg/m as calculated from the following:   Height as of this encounter: '6\' 1"'$  (1.854 m).   Weight as of this encounter: 80.7 kg.   Code status:   Code Status: Full Code   DVT Prophylaxis:IV heparin    Family Communication: Spousee at bedside   Disposition Plan: Status is: Inpatient Remains inpatient appropriate because: Pancreatitis-A-fib RVR-not yet stable for discharge.   Planned Discharge Destination:Home   Diet: Diet Order             Diet clear liquid Room service appropriate? Yes; Fluid consistency: Thin  Diet effective now                     Antimicrobial agents: Anti-infectives (From admission, onward)    Start     Dose/Rate Route Frequency Ordered Stop   07/31/22 0400  meropenem (MERREM) 1 g in sodium chloride 0.9 % 100 mL IVPB        1 g 200 mL/hr over 30 Minutes Intravenous Every 8 hours 07/31/22 0252     07/30/22 2245  piperacillin-tazobactam (ZOSYN) IVPB 3.375 g  Status:  Discontinued        3.375 g 12.5 mL/hr over 240 Minutes Intravenous Every 8 hours 07/30/22 2239 07/31/22 0239        MEDICATIONS: Scheduled Meds:  insulin aspart  0-6 Units Subcutaneous Q4H   Continuous Infusions:  amiodarone 30 mg/hr (08/02/22 0044)   heparin 1,050 Units/hr (08/02/22 0432)   lactated ringers 150 mL/hr at 08/01/22 1017   meropenem (MERREM) IV 1 g (08/02/22 0429)   PRN Meds:.acetaminophen **OR** acetaminophen, HYDROmorphone (DILAUDID) injection, ondansetron **OR** ondansetron (ZOFRAN) IV, umeclidinium bromide   I have personally reviewed following labs and imaging studies  LABORATORY DATA: CBC: Recent Labs  Lab 07/30/22 2048 07/31/22 0257 08/01/22 0717 08/02/22 0703  WBC 9.1 6.3 12.7* 15.3*  NEUTROABS 8.2*  --  11.6*  --   HGB 13.7 13.0 12.8* 12.7*  HCT 43.7 39.4 38.3* 38.1*  MCV 95.4 92.1 90.8 90.1  PLT 298 248 215 221     Basic Metabolic Panel: Recent Labs  Lab 07/30/22 2048 07/31/22 0257 08/01/22 0717  NA 138 136 139   K 4.1 3.8 3.9  CL 99 102 102  CO2 22 20* 26  GLUCOSE 203* 188* 110*  BUN 23 23 28*  CREATININE 1.30* 1.16 1.09  CALCIUM 9.3 8.8* 8.4*     GFR: Estimated Creatinine Clearance: 62.1 mL/min (by C-G formula based on SCr of 1.09 mg/dL).  Liver Function Tests: Recent Labs  Lab 07/30/22 2048 07/31/22 0257 08/01/22 0717  AST '26 24 22  '$ ALT '26 23 23  '$ ALKPHOS 68 59 53  BILITOT 1.3* 0.9 1.1  PROT 6.3* 5.5* 5.2*  ALBUMIN 3.3* 2.8* 2.4*    Recent Labs  Lab 07/30/22 2048  LIPASE 21    No results for input(s): "AMMONIA" in the last 168 hours.  Coagulation Profile: No results for input(s): "INR", "PROTIME" in the last 168 hours.  Cardiac Enzymes: No results for input(s): "CKTOTAL", "CKMB", "CKMBINDEX", "TROPONINI" in the last 168 hours.  BNP (last 3 results) No results for input(s): "PROBNP" in the last 8760 hours.  Lipid Profile: No results for input(s): "CHOL", "HDL", "LDLCALC", "TRIG", "CHOLHDL", "LDLDIRECT" in the last 72 hours.  Thyroid Function Tests: No results  for input(s): "TSH", "T4TOTAL", "FREET4", "T3FREE", "THYROIDAB" in the last 72 hours.  Anemia Panel: No results for input(s): "VITAMINB12", "FOLATE", "FERRITIN", "TIBC", "IRON", "RETICCTPCT" in the last 72 hours.  Urine analysis:    Component Value Date/Time   COLORURINE YELLOW 04/25/2022 1232   APPEARANCEUR CLEAR 04/25/2022 1232   LABSPEC <1.005 (L) 04/25/2022 1232   PHURINE 6.0 04/25/2022 1232   GLUCOSEU 100 (A) 04/25/2022 1232   HGBUR TRACE (A) 04/25/2022 1232   BILIRUBINUR NEGATIVE 04/25/2022 1232   KETONESUR NEGATIVE 04/25/2022 1232   PROTEINUR NEGATIVE 04/25/2022 1232   UROBILINOGEN 4.0 (H) 10/02/2015 2135   NITRITE POSITIVE (A) 04/25/2022 1232   LEUKOCYTESUR SMALL (A) 04/25/2022 1232    Sepsis Labs: Lactic Acid, Venous    Component Value Date/Time   LATICACIDVEN 6.2 (HH) 07/31/2022 0528    MICROBIOLOGY: No results found for this or any previous visit (from the past 240  hour(s)).  RADIOLOGY STUDIES/RESULTS: CT ABDOMEN PELVIS W CONTRAST  Result Date: 08/02/2022 CLINICAL DATA:  Choledocholithiasis status post endoscopic stone extraction complicated by post ERCP pancreatitis. Progressive abdominal pain. EXAM: CT ABDOMEN AND PELVIS WITH CONTRAST TECHNIQUE: Multidetector CT imaging of the abdomen and pelvis was performed using the standard protocol following bolus administration of intravenous contrast. RADIATION DOSE REDUCTION: This exam was performed according to the departmental dose-optimization program which includes automated exposure control, adjustment of the mA and/or kV according to patient size and/or use of iterative reconstruction technique. CONTRAST:  129m OMNIPAQUE IOHEXOL 300 MG/ML  SOLN COMPARISON:  07/30/2022 FINDINGS: Lower chest: Probable left anterior descending coronary artery stenting. Global cardiac size within normal limits. No pericardial effusion. Small bilateral pleural effusions have developed with associated mild bibasilar compressive atelectasis. Hepatobiliary: Status post cholecystectomy. No intra or extrahepatic biliary ductal dilation. Tiny focus of extraluminal gas adjacent to the distal common duct within the retroperitoneal soft tissues at axial image # 34/2 is seen, unchanged from prior examination. Additional punctate foci of extraluminal gas within this region appears to have resolved. Inflammatory changes within this region extending into the porta hepatis are again identified, possibly reflecting postsurgical changes related to reported sphincterotomy or changes related to acute interstitial/edematous pancreatitis. No enhancing intrahepatic mass identified. Portal vein is patent. Mild perihepatic ascites appears minimally increased. Pancreas: Previously noted hypoenhancement of the head of the pancreas has resolved and there is normal enhancement of the pancreatic parenchyma. The pancreatic duct is not dilated. As noted above, inflammatory  changes adjacent to the head of the pancreas extending into the porta hepatis are again identified and appear unchanged. Inflammatory changes also extend into the right anterior pararenal space and involving the mesenteric fat along the right lateral abdominal wall with associated peritoneal enhancement and thickening. Duodenal diverticulum again noted within the pancreatic head. Spleen: Unremarkable.  Mild perisplenic ascites has developed. Adrenals/Urinary Tract: The adrenal glands are unremarkable. The kidneys are normal save for simple cortical cyst within the interpolar region of the right kidney. No further follow-up imaging is recommended for this lesion. The bladder is unremarkable. Stomach/Bowel: There is circumferential wall thickening and mild surrounding inflammatory stranding involving the gastric antrum and duodenal bulb which may relate to the adjacent inflammatory changes within the retroperitoneum surrounding the head of the pancreas and extending into the porta hepatis. However, a superimposed infectious or inflammatory antritis/duodenitis is difficult to exclude. This appears similar to prior examination. The stomach, small bowel, and large bowel are otherwise unremarkable save for moderate descending and sigmoid colonic diverticulosis. Mild ascites has increased in the interval since  prior examination. Inflammatory changes surround the appendix, similar to prior examination, likely related to tracking changes from the right upper quadrant. No free intraperitoneal gas. Vascular/Lymphatic: Stable 4 cm infrarenal abdominal aortic aneurysm. Moderate aortoiliac atherosclerotic calcification. No pathologic adenopathy within the abdomen and pelvis. Reproductive: Moderate prostatic enlargement. Other: Tiny fat containing umbilical hernia. Bilateral inguinal hernia repair with mesh has been performed. Mild interval increase in diffuse subcutaneous body wall edema in keeping with progressive anasarca  Musculoskeletal: Lytic expansile lesion within the left iliac wing is unchanged from prior examination, possibly posttraumatic in etiology. 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 Degenerative changes noted of the thoracolumbar spine. No acute bone abnormality. IMPRESSION: 1. Interval resolution of previously noted hypoenhancement of the head of the pancreas with normal enhancement of the pancreatic parenchyma. 2. Persistent inflammatory changes within the retroperitoneum surrounding the head of the pancreas and extending into the porta hepatis and along the right lateral abdominal wall with associated peritoneal thickening and enhancement. This may reflect changes related to acute interstitial/edematous pancreatitis or reflect post surgical changes related to reported sphincterotomy with extraluminal, retroperitoneal fluid leak. Punctate foci of extraluminal gas within this region has improved in the interval since prior exam. No evidence of pancreatic necrosis. 3. Interval development of small bilateral pleural effusions, mild diffuse body wall edema and mild ascites in keeping with progressive mild anasarca. 4. Stable 4 cm infrarenal abdominal aortic aneurysm. 5. Moderate descending and sigmoid colonic diverticulosis. 6. Moderate prostatic enlargement. 7. Stable lytic lesion within the left iliac wing, possibly posttraumatic in etiology. Aortic Atherosclerosis (ICD10-I70.0). Electronically Signed   By: Fidela Salisbury M.D.   On: 08/02/2022 01:39   DG Chest Port 1V same Day  Result Date: 08/01/2022 CLINICAL DATA:  [id number]; shortness of breath EXAM: PORTABLE CHEST 1 VIEW COMPARISON:  Radiograph dated July 11, 2022, CT dated July 30, 2022 FINDINGS: The cardiomediastinal silhouette is unchanged in contour.Atherosclerotic calcifications of the aorta. No pleural effusion. No pneumothorax. RIGHT basilar platelike opacity. Visualized abdomen is unremarkable. IMPRESSION: RIGHT basilar platelike opacity  most consistent with atelectasis. Electronically Signed   By: Valentino Saxon M.D.   On: 08/01/2022 11:07     LOS: 2 days   Little Ishikawa, DO  [facility]    To contact the attending provider between 7A-7P or the covering provider during after hours 7P-7A, please log into the web site [email or web address] and access using universal [place] password for that web site. If you do not have the password, please call the hospital operator.  08/02/2022, 8:02 AM

## 2022-08-02 NOTE — Progress Notes (Signed)
ANTICOAGULATION CONSULT NOTE - Initial Consult  Pharmacy Consult for heparin infusion Indication: atrial fibrillation  No Known Allergies  Patient Measurements: Height: '6\' 1"'$  (185.4 cm) Weight: 80.7 kg (178 lb) IBW/kg (Calculated) : 79.9 Heparin Dosing Weight: 80.7  Vital Signs: Temp: 97.7 F (36.5 C) (08/06 1200) Temp Source: Oral (08/06 1200) BP: 136/63 (08/06 1200) Pulse Rate: 88 (08/06 1200)  Labs: Recent Labs    07/31/22 0257 08/01/22 0717 08/01/22 1832 08/01/22 2158 08/01/22 2338 08/02/22 0703 08/02/22 1453  HGB 13.0 12.8*  --   --   --  12.7*  --   HCT 39.4 38.3*  --   --   --  38.1*  --   PLT 248 215  --   --   --  221  --   APTT  --   --  121*  --   --  73*  --   HEPARINUNFRC  --   --  >1.10*  --   --  0.67 0.41  CREATININE 1.16 1.09  --   --   --  0.98  --   TROPONINIHS  --   --   --  78* 65*  --   --      Estimated Creatinine Clearance: 69.1 mL/min (by C-G formula based on SCr of 0.98 mg/dL).   Medical History: Past Medical History:  Diagnosis Date   Bipolar disorder ([place])    Chronic back pain    Complication of anesthesia    prolonged sedation and confusion   COPD (chronic obstructive pulmonary disease) (HCC)    Dr. Laurann Montana   Diabetes mellitus without complication ([place])    type 2   Diverticulosis    Dysrhythmia    Afib  onset 03-2022   ED (erectile dysfunction)    Gallstones    GERD (gastroesophageal reflux disease)    Hiatal hernia    Hyperlipidemia    Hypertension    Wears dentures    upper   Wears glasses    Wears hearing aid in both ears     Medications:  Infusions:   amiodarone 30 mg/hr (08/02/22 0044)   heparin 1,050 Units/hr (08/02/22 0432)   piperacillin-tazobactam (ZOSYN)  IV      Assessment: Douglas Hurst is a 81 y/o male whose pertinent medication history includes Afib and CHF. Pharmacy has been consulted to initiate a heparin infusion, no bolus per MD. His last dose of Eliquis was charted as 7/30.   Confirm heparin level came  back therapeutic this PM. Cont current rate.   Goal of Therapy:  Heparin level: 0.3-0.7 Monitor platelets by anticoagulation protocol: Yes    Plan:  Continue heparin infusion at 1050 units/hr Continue daily heparin levels with AM labs  Continue to monitor H&H and platelets via [place], PharmD, BCIDP, AAHIVP, CPP Infectious Disease Pharmacist 08/02/2022 3:38 PM   ----------------------------------------------------------------------------------------------------------

## 2022-08-02 NOTE — Progress Notes (Addendum)
ANTICOAGULATION CONSULT NOTE - Initial Consult  Pharmacy Consult for heparin infusion Indication: atrial fibrillation  No Known Allergies  Patient Measurements: Height: '6\' 1"'$  (185.4 cm) Weight: 80.7 kg (178 lb) IBW/kg (Calculated) : 79.9 Heparin Dosing Weight: 80.7  Vital Signs: Temp: 97.7 F (36.5 C) (08/06 0753) Temp Source: Oral (08/06 0753) BP: 180/80 (08/06 0753) Pulse Rate: 105 (08/06 0753)  Labs: Recent Labs    07/31/22 0257 08/01/22 0717 08/01/22 1832 08/01/22 2158 08/01/22 2338 08/02/22 0703  HGB 13.0 12.8*  --   --   --  12.7*  HCT 39.4 38.3*  --   --   --  38.1*  PLT 248 215  --   --   --  221  APTT  --   --  121*  --   --  73*  HEPARINUNFRC  --   --  >1.10*  --   --  0.67  CREATININE 1.16 1.09  --   --   --  0.98  TROPONINIHS  --   --   --  78* 65*  --      Estimated Creatinine Clearance: 69.1 mL/min (by C-G formula based on SCr of 0.98 mg/dL).   Medical History: Past Medical History:  Diagnosis Date   Bipolar disorder ([place])    Chronic back pain    Complication of anesthesia    prolonged sedation and confusion   COPD (chronic obstructive pulmonary disease) (HCC)    Dr. Laurann Montana   Diabetes mellitus without complication ([place])    type 2   Diverticulosis    Dysrhythmia    Afib  onset 03-2022   ED (erectile dysfunction)    Gallstones    GERD (gastroesophageal reflux disease)    Hiatal hernia    Hyperlipidemia    Hypertension    Wears dentures    upper   Wears glasses    Wears hearing aid in both ears     Medications:  Infusions:   amiodarone 30 mg/hr (08/02/22 0044)   heparin 1,050 Units/hr (08/02/22 0432)   lactated ringers 150 mL/hr at 08/01/22 1017   meropenem (MERREM) IV 1 g (08/02/22 0429)    Assessment: GJ is a 81 y/o male whose pertinent medication history includes Afib and CHF. Pharmacy has been consulted to initiate a heparin infusion, no bolus per MD. His last dose of Eliquis was charted as 7/30.   aPTT of 73 seconds and  heparin level of 0.67 both therapeutic this morning and appear to be correlating. This is the first therapeutic measurement. Will discontinue aPTT levels and proceed with heparin levels only. RN noted no concerns for bleeding or issues with the line.   Goal of Therapy:  Heparin level 0.3-0.7 units/ml aPTT 66-102 seconds Monitor platelets by anticoagulation protocol: Yes    Plan:  Continue heparin infusion at 1050 units/hr Order a confirmatory heparin level in 8 hours  Continue daily heparin levels with AM labs  Continue to monitor H&H and platelets via [place], PharmD  PGY1 Pharmacy Resident  ----------------------------------------------------------------------------------------------------------I discussed / reviewed the pharmacy note by Dr. Kara Mead and I agree with the resident's findings and plans as documented.  Vaughan Basta BS, PharmD, BCPS Clinical Pharmacist 08/02/2022 9:58 AM  Contact: [phone] after 3 PM  "Be curious, not judgmental..." -Jamal Maes ----------------------------------------------------------------------------------------------------------

## 2022-08-02 NOTE — Progress Notes (Signed)
Pharmacy Antibiotic Note- follow-up  Douglas Hurst. is a 81 y.o. male admitted on 07/30/2022 with  pancreatitis w/o evidence of necrotizing pancreatitis on CT w/ contrast .  Pharmacy has been consulted to change merrem to zosyn. Patient is clinically doing worse at this point  Assessment: CT abdomen pelvis w/ contrast performed on 08/01/2022. The impressions from the radiologist are as follows:  1. Interval resolution of previously noted hypoenhancement of the head of the pancreas with normal enhancement of the pancreatic parenchyma  2. Persistent inflammatory changes within the retroperitoneum surrounding the head of the pancreas and extending into the porta hepatis and along the right lateral abdominal wall with associated peritoneal thickening and enhancement. This may reflect changes related to acute interstitial/edematous pancreatitis or reflect post surgical changes related to reported sphincterotomy with extraluminal, retroperitoneal fluid leak. Punctate foci of extraluminal gas within this region has improved in the interval since prior exam. No evidence of pancreatic necrosis  3. Interval development of small bilateral pleural effusions, mild diffuse body wall edema and mild ascites in keeping with progressive mild anasarca  4. Stable 4 cm infrarenal abdominal aortic aneurysm  5. Moderate descending and sigmoid colonic diverticulosis  6. Moderate prostatic enlargement  7. Stable lytic lesion within the left iliac wing, possibly posttraumatic in etiology  Plan:  DC Meropenem Start zosyn 4.5 grams iv x1 f/b 3.375 gm EI iv q8h   Height: '6\' 1"'$  (185.4 cm) Weight: 80.7 kg (178 lb) IBW/kg (Calculated) : 79.9  Temp (24hrs), Avg:97.9 F (36.6 C), Min:97.4 F (36.3 C), Max:98.2 F (36.8 C)  Recent Labs  Lab 07/30/22 2048 07/31/22 0257 07/31/22 0528 08/01/22 0717 08/02/22 0703  WBC 9.1 6.3  --  12.7* 15.3*  CREATININE 1.30* 1.16  --  1.09 0.98  LATICACIDVEN  --   4.9* 6.2*  --   --      Estimated Creatinine Clearance: 69.1 mL/min (by C-G formula based on SCr of 0.98 mg/dL).    No Known Allergies  Micro's No micro's sent  8/3- zosyn x1 dose 8/4- merrem >> 8/6 8/6- zosyn >>  Thank you for allowing pharmacy to be a part of this patient's care.  Vaughan Basta BS, PharmD, BCPS Clinical Pharmacist 08/02/2022 11:20 AM  Contact: [phone] after 3 PM  "Be curious, not judgmental..." -Jamal Maes

## 2022-08-02 NOTE — Progress Notes (Addendum)
[facility] night coverage note:  Pt with worsening abd pain, mostly epigastric, but has peritoneal findings on exam with guarding throughout abdomen now.  Denies CP, denies L arm pain.  SBP 160, HR 120s A.Fib.  Pt got emergent repeat CT AP to see if any worsening of the known contained perf / necrotizing pancreatitis.  CTAP: official read pending but I spoke with radiologist on phone: Tiny focus of extraluminal air that is similar to slightly improved when compared to the 8/3 CT suggests that the contained perf is at least no-worse than previously.  Does have increased inflammation.  Overall necrosis of pancreas also doesn't look any worse / more severe.  Overall looks like hes 'just' having worsening inflammation / peritonitis related to his severe pancreatitis.  Doesn't appear that perforation or necrosis are any worse or surgical at this point.  On merrem. Increased dilaudid to '1mg'$  Q2H PRN, may need to increase further.

## 2022-08-02 NOTE — Progress Notes (Signed)
[facility] Gastroenterology Progress Note  Douglas Hurst. 81 y.o. 16-Dec-1941  CC: Abdominal pain, pancreatitis/duodenitis   Subjective: Patient seen and examined at bedside.  He continues to have abdominal pain.  Was somewhat agitated this morning.  ROS : Afebrile, positive for shortness of breath, negative for chest pain   Objective: Vital signs in last 24 hours: Vitals:   08/02/22 0400 08/02/22 0753  BP: (!) 144/77 (!) 180/80  Pulse: 88 (!) 105  Resp: 18 20  Temp: 97.9 F (36.6 C) 97.7 F (36.5 C)  SpO2: 94% 94%    Physical Exam:  General -elderly patient.  In mild distress from pain ,oxygen by nasal cannula.  Somewhat agitated Abdomen -epigastric/generalized tenderness to palpation, abdomen is soft, bowel sounds present, no peritoneal signs Chest -anterior exam only, poor air entry bilaterally Lower extremity -no edema  Lab Results: Recent Labs    08/01/22 0717 08/02/22 0703  NA 139 136  K 3.9 3.2*  CL 102 101  CO2 26 23  GLUCOSE 110* 109*  BUN 28* 24*  CREATININE 1.09 0.98  CALCIUM 8.4* 8.3*   Recent Labs    08/01/22 0717 08/02/22 0703  AST 22 19  ALT 23 20  ALKPHOS 53 72  BILITOT 1.1 1.0  PROT 5.2* 5.5*  ALBUMIN 2.4* 2.3*   Recent Labs    07/30/22 2048 07/31/22 0257 08/01/22 0717 08/02/22 0703  WBC 9.1   < > 12.7* 15.3*  NEUTROABS 8.2*  --  11.6*  --   HGB 13.7   < > 12.8* 12.7*  HCT 43.7   < > 38.3* 38.1*  MCV 95.4   < > 90.8 90.1  PLT 298   < > 215 221   < > = values in this interval not displayed.   No results for input(s): "LABPROT", "INR" in the last 72 hours.    Assessment/Plan: -Worsening abdominal pain s/p complicated ERCP at [facility] as mentioned below on 07/30/2022    CT scan shows pancreatitis involving pancreatic head as well as thickening of the duodenum.  Normal lipase.  Normal LFTs.     He underwent EUS,[place] ERCP and sphincterotomy with 2 stones removal. Regular ERCP was unsuccessful.  Dr. Okey Dupre did a transduodenal  approach to puncture the extrahepatic bile duct to pass a guidewire in to the duodenum.  Subsequently ERCP scope was inserted and cannulation was performed over the guidewire.  Largest one was around 12 mm in size.  2 stones were removed from CBD.   -History of A-fib.  Eliquis on hold  Recommendations --------------------------- -Repeat CT abdomen with IV contrast last night showed resolution of hypoenhancement of the pancreatic head with persistent inflammatory changes within the retroperitoneum surrounding the head of the pancreas.  This likely reflects retroperitoneal fluid leaks/microperforation from transduodenal approach /sphincterotomy related complication.  -Decrease IV hydration to 75  cc/h because of his shortness of breath/pleural effusion.  -Discussed with pharmacy team as well as hospitalist.  Given patient's worsening leukocytosis and abdominal pain, we will change antibiotic from meropenem to Zosyn.   Otis Brace MD, [place] 08/02/2022, 10:29 AM  Contact #  [phone]

## 2022-08-03 DIAGNOSIS — R1011 Right upper quadrant pain: Secondary | ICD-10-CM | POA: Diagnosis not present

## 2022-08-03 DIAGNOSIS — R1013 Epigastric pain: Secondary | ICD-10-CM | POA: Diagnosis not present

## 2022-08-03 DIAGNOSIS — R1084 Generalized abdominal pain: Secondary | ICD-10-CM | POA: Diagnosis not present

## 2022-08-03 DIAGNOSIS — R932 Abnormal findings on diagnostic imaging of liver and biliary tract: Secondary | ICD-10-CM | POA: Diagnosis not present

## 2022-08-03 DIAGNOSIS — K859 Acute pancreatitis without necrosis or infection, unspecified: Secondary | ICD-10-CM | POA: Diagnosis not present

## 2022-08-03 LAB — CBC
HCT: 36.9 % — ABNORMAL LOW (ref 39.0–52.0)
Hemoglobin: 12.3 g/dL — ABNORMAL LOW (ref 13.0–17.0)
MCH: 29.6 pg (ref 26.0–34.0)
MCHC: 33.3 g/dL (ref 30.0–36.0)
MCV: 88.7 fL (ref 80.0–100.0)
Platelets: 161 10*3/uL (ref 150–400)
RBC: 4.16 MIL/uL — ABNORMAL LOW (ref 4.22–5.81)
RDW: 16 % — ABNORMAL HIGH (ref 11.5–15.5)
WBC: 10.4 10*3/uL (ref 4.0–10.5)
nRBC: 0 % (ref 0.0–0.2)

## 2022-08-03 LAB — COMPREHENSIVE METABOLIC PANEL
ALT: 17 U/L (ref 0–44)
AST: 18 U/L (ref 15–41)
Albumin: 2 g/dL — ABNORMAL LOW (ref 3.5–5.0)
Alkaline Phosphatase: 64 U/L (ref 38–126)
Anion gap: 8 (ref 5–15)
BUN: 17 mg/dL (ref 8–23)
CO2: 26 mmol/L (ref 22–32)
Calcium: 8.3 mg/dL — ABNORMAL LOW (ref 8.9–10.3)
Chloride: 105 mmol/L (ref 98–111)
Creatinine, Ser: 0.79 mg/dL (ref 0.61–1.24)
GFR, Estimated: >60 mL/min (ref >60)
Glucose, Bld: 95 mg/dL (ref 70–99)
Potassium: 3.4 mmol/L — ABNORMAL LOW (ref 3.5–5.1)
Sodium: 139 mmol/L (ref 135–145)
Total Bilirubin: 1 mg/dL (ref 0.3–1.2)
Total Protein: 5 g/dL — ABNORMAL LOW (ref 6.5–8.1)

## 2022-08-03 LAB — GLUCOSE, CAPILLARY
Glucose-Capillary: 88 mg/dL (ref 70–99)
Glucose-Capillary: 78 mg/dL (ref 70–99)
Glucose-Capillary: 82 mg/dL (ref 70–99)
Glucose-Capillary: 103 mg/dL — ABNORMAL HIGH (ref 70–99)
Glucose-Capillary: 97 mg/dL (ref 70–99)

## 2022-08-03 LAB — HEPARIN LEVEL (UNFRACTIONATED): Heparin Unfractionated: 0.51 IU/mL (ref 0.30–0.70)

## 2022-08-03 LAB — MAGNESIUM: Magnesium: 1.7 mg/dL (ref 1.7–2.4)

## 2022-08-03 MED ORDER — AMIODARONE HCL 200 MG PO TABS
200.0000 mg | ORAL_TABLET | Freq: Every day | ORAL | Status: DC
  Administered 2022-08-03 – 2022-09-08 (×37): 200 mg via ORAL
  Filled 2022-08-03 (×37): qty 1

## 2022-08-03 NOTE — Progress Notes (Signed)
Subjective: Abdominal pain improving (not resolved).  Objective: Vital signs in last 24 hours: Temp:  [97.6 F (36.4 C)-98.5 F (36.9 C)] 97.6 F (36.4 C) (08/07 1133) Pulse Rate:  [83-100] 87 (08/07 1133) Resp:  [8-20] 16 (08/07 1133) BP: (132-185)/(70-89) 161/76 (08/07 1133) SpO2:  [95 %-99 %] 99 % (08/07 1133) Weight:  [80.9 kg] 80.9 kg (08/07 0500) Weight change:    PE: GEN:  Elderly, NAD, thin HEENT:  [place]/AT, anicteric NEURO:  A/O, no encephalopathy ABD:  Soft, mild/moderate PU/EG tenderness with voluntary guarding (improved)  Lab Results: CBC    Component Value Date/Time   WBC 10.4 08/03/2022 0819   RBC 4.16 (L) 08/03/2022 0819   HGB 12.3 (L) 08/03/2022 0819   HGB 13.5 06/15/2022 1006   HCT 36.9 (L) 08/03/2022 0819   HCT 41.3 06/15/2022 1006   PLT 161 08/03/2022 0819   PLT 219 06/15/2022 1006   MCV 88.7 08/03/2022 0819   MCV 92 06/15/2022 1006   MCH 29.6 08/03/2022 0819   MCHC 33.3 08/03/2022 0819   RDW 16.0 (H) 08/03/2022 0819   RDW 13.6 06/15/2022 1006   LYMPHSABS 0.5 (L) 08/01/2022 0717   MONOABS 0.3 08/01/2022 0717   EOSABS 0.0 08/01/2022 0717   BASOSABS 0.0 08/01/2022 0717   CMP     Component Value Date/Time   NA 139 08/03/2022 0819   NA 139 06/15/2022 1006   K 3.4 (L) 08/03/2022 0819   CL 105 08/03/2022 0819   CO2 26 08/03/2022 0819   GLUCOSE 95 08/03/2022 0819   BUN 17 08/03/2022 0819   BUN 12 06/15/2022 1006   CREATININE 0.79 08/03/2022 0819   CALCIUM 8.3 (L) 08/03/2022 0819   PROT 5.0 (L) 08/03/2022 0819   PROT 5.8 (L) 06/15/2022 1006   ALBUMIN 2.0 (L) 08/03/2022 0819   ALBUMIN 3.7 06/15/2022 1006   AST 18 08/03/2022 0819   ALT 17 08/03/2022 0819   ALKPHOS 64 08/03/2022 0819   BILITOT 1.0 08/03/2022 0819   BILITOT 0.5 06/15/2022 1006   GFRNONAA >60 08/03/2022 0819   GFRAA >60 10/02/2015 1956   Assessment:   ERCP-mediated pancreatitis, improving.  Plan:   Supportive care with judicious analgesics, gentle hydrate. Clear liquid  diet. PT evaluation in progress. Discussed with patient/wife if we don't make much progress with diet in the next few days, we would need to consider nasoenteric feeding tube. [facility] will follow.   Landry Dyke 08/03/2022, 2:53 PM   Cell [phone] If no answer or after 5 PM call [phone]

## 2022-08-03 NOTE — Progress Notes (Signed)
ANTICOAGULATION CONSULT NOTE - follow-up  Pharmacy Consult for heparin infusion Indication: atrial fibrillation  No Known Allergies  Patient Measurements: Height: '6\' 1"'$  (185.4 cm) Weight: 80.9 kg (178 lb 5.6 oz) IBW/kg (Calculated) : 79.9 Heparin Dosing Weight: 80.7  Vital Signs: Temp: 98 F (36.7 C) (08/07 0750) Temp Source: Oral (08/07 0750) BP: 136/73 (08/07 0750) Pulse Rate: 86 (08/07 0750)  Labs: Recent Labs    08/01/22 0717 08/01/22 1832 08/01/22 1832 08/01/22 2158 08/01/22 2338 08/02/22 0703 08/02/22 1453 08/03/22 0819  HGB 12.8*  --   --   --   --  12.7*  --  12.3*  HCT 38.3*  --   --   --   --  38.1*  --  36.9*  PLT 215  --   --   --   --  221  --  161  APTT  --  121*  --   --   --  73*  --   --   HEPARINUNFRC  --  >1.10*   < >  --   --  0.67 0.41 0.51  CREATININE 1.09  --   --   --   --  0.98  --  0.79  TROPONINIHS  --   --   --  78* 65*  --   --   --    < > = values in this interval not displayed.     Estimated Creatinine Clearance: 84.6 mL/min (by C-G formula based on SCr of 0.79 mg/dL).   Medical History: Past Medical History:  Diagnosis Date   Bipolar disorder ([place])    Chronic back pain    Complication of anesthesia    prolonged sedation and confusion   COPD (chronic obstructive pulmonary disease) (HCC)    Dr. Laurann Montana   Diabetes mellitus without complication ([place])    type 2   Diverticulosis    Dysrhythmia    Afib  onset 03-2022   ED (erectile dysfunction)    Gallstones    GERD (gastroesophageal reflux disease)    Hiatal hernia    Hyperlipidemia    Hypertension    Wears dentures    upper   Wears glasses    Wears hearing aid in both ears     Medications:  Infusions:   amiodarone 30 mg/hr (08/02/22 2249)   heparin 1,050 Units/hr (08/03/22 0514)   piperacillin-tazobactam (ZOSYN)  IV 3.375 g (08/02/22 2333)    Assessment: Douglas Hurst is a 81 y/o male whose pertinent medication history includes Afib and CHF. Pharmacy has been consulted to  initiate a heparin infusion, no bolus per MD. His last dose of Eliquis was charted as 7/30.   Confirm heparin level came back therapeutic this AM. Cont current rate.   Goal of Therapy:  Heparin level: 0.3-0.7 Monitor platelets by anticoagulation protocol: Yes    Plan:  Continue heparin infusion at 1050 units/hr Continue daily heparin levels with AM labs  Continue to monitor H&H and platelets via CBC  Chantea Surace BS, PharmD, BCPS Clinical Pharmacist 08/03/2022 9:35 AM  Contact: [phone] after 3 PM  "Be curious, not judgmental..." -Jamal Maes

## 2022-08-03 NOTE — Evaluation (Signed)
Physical Therapy Evaluation Patient Details Name: Douglas Hurst. MRN: [id number] DOB: 12/29/1940 Today's Date: 08/03/2022  History of Present Illness  Patient is a 81 yo male presenting to the ED with severe abdominal pain with radiation to right shoulder on 07/31/22. Patient had underwent ECRP for removal of stones from biliary and pancreatic duct on 8/3. Admitted with acute pancreatitis. PMH includes: bipolar d/o DMII, HTN, HLD, GERD, COPD ,  Afib ,diastolic heart failure, AAA with interim history of acute cholecystitis  Clinical Impression  Pt was seen for mobility after having readmitted for abd pain from his original procedure.  Has good expectations for improvement, but is presently tired and in pain.  He is medicated, and will anticipate that he is going to improve his mobility as his pain recedes.  Wife is supportive of plan to go to rehab, and will expect him to make improvement in a timely way to return home as was planned before pain flared.  He is potentially a CIR candidate per initial chart screen and so will work with pt toward this goal, to be up OOB and walking as tolerated with AD as needed.       Recommendations for follow up therapy are one component of a multi-disciplinary discharge planning process, led by the attending physician.  Recommendations may be updated based on patient status, additional functional criteria and insurance authorization.  Follow Up Recommendations Acute inpatient rehab (3hours/day)      Assistance Recommended at Discharge Frequent or constant Supervision/Assistance  Patient can return home with the following  A lot of help with walking and/or transfers;A lot of help with bathing/dressing/bathroom;Assistance with cooking/housework;Direct supervision/assist for medications management;Direct supervision/assist for financial management;Assist for transportation;Help with stairs or ramp for entrance    Equipment Recommendations None recommended by PT   Recommendations for Other Services  Rehab consult    Functional Status Assessment Patient has had a recent decline in their functional status and demonstrates the ability to make significant improvements in function in a reasonable and predictable amount of time.     Precautions / Restrictions Precautions Precautions: Fall Restrictions Weight Bearing Restrictions: No      Mobility  Bed Mobility Overal bed mobility: Needs Assistance Bed Mobility: Supine to Sit, Sit to Supine     Supine to sit: Mod assist Sit to supine: Mod assist   General bed mobility comments: mod assist with repetitive and variable cues needed to sequence to bed esp after being up    Transfers Overall transfer level: Needs assistance                 General transfer comment: declined to try to stand, but was feeling bad adn fatigued from OT visit    Ambulation/Gait               General Gait Details: declined to try  Stairs            Wheelchair Mobility    Modified Rankin (Stroke Patients Only)       Balance Overall balance assessment: Needs assistance Sitting-balance support: Feet supported Sitting balance-Leahy Scale: Fair         Standing balance comment: would not attempt                             Pertinent Vitals/Pain Pain Assessment Pain Assessment: Faces Faces Pain Scale: Hurts little more Pain Location: stomach Pain Descriptors / Indicators: Grimacing, Guarding Pain Intervention(s): Limited  activity within patient's tolerance, Premedicated before session, Monitored during session, Repositioned    Home Living Family/patient expects to be discharged to:: Private residence Living Arrangements: Spouse/significant other Available Help at Discharge: Family;Available 24 hours/day Type of Home: House Home Access: Stairs to enter Entrance Stairs-Rails: None Entrance Stairs-Number of Steps: 3   Home Layout: One level Home Equipment: Wheelchair  - manual;BSC/3in1;Cane - single point;Rollator (4 wheels) Additional Comments: Sponge bathes at baselin    Prior Function Prior Level of Function : Needs assist       Physical Assist : Mobility (physical) Mobility (physical): Gait   Mobility Comments: has been using a rollator       Hand Dominance   Dominant Hand: Right    Extremity/Trunk Assessment   Upper Extremity Assessment Upper Extremity Assessment: Defer to OT evaluation    Lower Extremity Assessment Lower Extremity Assessment: Generalized weakness    Cervical / Trunk Assessment Cervical / Trunk Assessment: Kyphotic  Communication   Communication: No difficulties  Cognition Arousal/Alertness: Awake/alert Behavior During Therapy: Flat affect Overall Cognitive Status: Within Functional Limits for tasks assessed                                 General Comments: due to memory wife is helping to answer questions        General Comments General comments (skin integrity, edema, etc.): Pt is weak generally and cannot tolerate much mobility, requiring help to return to bed    Exercises     Assessment/Plan    PT Assessment Patient needs continued PT services  PT Problem List Decreased strength;Decreased range of motion;Decreased activity tolerance;Decreased balance;Decreased mobility;Decreased coordination;Decreased cognition;Decreased knowledge of use of DME;Decreased safety awareness;Pain       PT Treatment Interventions DME instruction;Gait training;Stair training;Functional mobility training;Therapeutic activities;Therapeutic exercise;Balance training;Neuromuscular re-education;Patient/family education    PT Goals (Current goals can be found in the Care Plan section)  Acute Rehab PT Goals Patient Stated Goal: to get stronger and pain reduced PT Goal Formulation: With patient/family Time For Goal Achievement: 08/17/22 Potential to Achieve Goals: Good    Frequency Min 3X/week      Co-evaluation               AM-PAC PT "6 Clicks" Mobility  Outcome Measure Help needed turning from your back to your side while in a flat bed without using bedrails?: A Lot Help needed moving from lying on your back to sitting on the side of a flat bed without using bedrails?: A Lot Help needed moving to and from a bed to a chair (including a wheelchair)?: Total Help needed standing up from a chair using your arms (e.g., wheelchair or bedside chair)?: Total Help needed to walk in hospital room?: Total Help needed climbing 3-5 steps with a railing? : Total 6 Click Score: 8    End of Session Equipment Utilized During Treatment: Oxygen Activity Tolerance: Treatment limited secondary to medical complications (Comment);Patient limited by pain Patient left: in bed;with call bell/phone within reach;with bed alarm set Nurse Communication: Mobility status PT Visit Diagnosis: Muscle weakness (generalized) (M62.81);Difficulty in walking, not elsewhere classified (R26.2);Other abnormalities of gait and mobility (R26.89);Pain Pain - Right/Left: Right Pain - part of body:  (abdomen)    Time: 8921-1941 PT Time Calculation (min) (ACUTE ONLY): 10 min   Charges:   PT Evaluation $PT Eval Moderate Complexity: 1 Mod         Ramond Dial 08/03/2022,  1:14 PM  Mee Hives, PT PhD Acute Rehab Dept. Number: [place] and [place]

## 2022-08-03 NOTE — Progress Notes (Signed)
   Inpatient Rehab Admissions Coordinator :  Per OT recommendations, patient was screened for CIR candidacy by Danne Baxter RN MSN.  At this time patient appears to be a potential candidate for CIR. I will place a rehab consult per protocol for full assessment. Please call me with any questions.  Danne Baxter RN MSN Admissions Coordinator [phone]

## 2022-08-03 NOTE — Care Management Important Message (Signed)
Important Message  Patient Details  Name: Douglas Hurst. MRN: [id number] Date of Birth: 04-10-1941   Medicare Important Message Given:  Yes     [name] 08/03/2022, 3:03 PM

## 2022-08-03 NOTE — Progress Notes (Signed)
  Transition of Care [facility]) Screening Note   Patient Details  Name: Douglas Hurst. Date of Birth: 1941-11-11   Transition of Care [facility]) CM/SW Contact:    Cyndi Bender, RN Phone Number: 08/03/2022, 8:05 AM    Transition of Care Department [facility]) has reviewed patient and no TOC needs have been identified at this time. We will continue to monitor patient advancement through interdisciplinary progression rounds. If new patient transition needs arise, please place a TOC consult.

## 2022-08-03 NOTE — Progress Notes (Signed)
PROGRESS NOTE        PATIENT DETAILS Name: Douglas Hurst. Age: 81 y.o. Sex: male Date of Birth: 05-Oct-1941 Admit Date: 07/30/2022 Admitting Physician Clance Boll, MD ZGY:FVCBSWH, [name], MD  Brief Summary: Patient is a 81 y.o.  male with history of PAF on Eliquis, HTN, HLD, COPD, chronic HFrEF (recovered EF)-with recent history of cholecystitis-s/p cholecystectomy but with retained CBD stone.  ERCP here was unsuccessful.  Patient was referred to [facility] underwent advanced endoscopy via transduodenal approach for ERCP on 8/3 at [facility] with Dr. Okey Dupre -he subsequently presented to the ED with severe abdominal pain-was found to have pancreatitis and admitted to the hospitalist service.  Significant events: 8/3>> admit to [facility]-abdominal pain after ERCP at [facility]-found to have acute pancreatitis 8/5>> A-fib RVR-no response to IV Lopressor-blood pressure soft-amiodarone infusion started.  After discussion with GI-heparin started without bolus. 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 8/5>>Repeat urgent CT-AP overnight due to worsening pain without over changes  Consults: GI Phone consult-cards-Dr Crenshaw  Subjective: Lying comfortably in bed-denies any chest pain or shortness of breath. Pelvic pain with urge to void ongoing but improving on flomax - able to urinate overnight/this am without incident.  Objective: Vitals: Blood pressure 139/78, pulse 100, temperature 97.8 F (36.6 C), temperature source Oral, resp. rate 15, height '6\' 1"'$  (1.854 m), weight 80.9 kg, SpO2 95 %.   Exam: Gen Exam:Alert awake-not in any distress HEENT:atraumatic, normocephalic Chest: B/L clear to auscultation  anteriorly CVS:S1S2 irregular Abdomen:soft -tender in the upper abdomen without any peritoneal signs. Extremities:no edema Neurology: Non focal Skin: no rash  Pertinent Labs/Radiology:    Latest Ref Rng & Units 08/02/2022    7:03 AM 08/01/2022    7:17 AM 07/31/2022    2:57 AM  CBC  WBC 4.0 - 10.5 K/uL 15.3  12.7  6.3   Hemoglobin 13.0 - 17.0 g/dL 12.7  12.8  13.0   Hematocrit 39.0 - 52.0 % 38.1  38.3  39.4   Platelets 150 - 400 K/uL 221  215  248     Lab Results  Component Value Date   NA 136 08/02/2022   K 3.2 (L) 08/02/2022   CL 101 08/02/2022   CO2 23 08/02/2022      Assessment/Plan:  Acute pancreatitis (possibly necrotizing)-duodenitis with contained duodenal perforation (transduodenal approach to puncture extrahepatic bile duct to place guidewire into duodenum-subsequently ERCP scope inserted and stone removed):  Continues to have epigastric pain-transition to zosyn from meropenem Per GI-okay to advance to clear liquids.  Repeat CT shows stable disease  PAF with RVR:  Provoked by pancreatitis/pain/acute illness-given 1 dose of IV Lopressor-minimal change in heart rate-but now blood pressure dropping/soft-discussed with cardiology-Dr. Stanford Breed over the phone-we will start amiodarone infusion (on oral amiodarone at home).  After discussion with GI-start heparin infusion without bolus (Eliquis held since 7/31 for ERCP).  Continue to monitor closely on telemetry.  When blood pressure more acceptable-we will start oral metoprolol.  Ambulatory dysfunction Multifactorial - follow PT/OT recs  AKI, resolved Complicated by BPH and lower urinary tract obstruction  Resolved-likely hemodynamically mediated. Resume flomax - symptoms improving somewhat already; not back to baseline  Chronic HFrEF with recovered EF: Some mild shortness of breath-but also anxious-no obvious signs of volume overload-monitor closely while on IVF for pancreatitis.    DM-2: CBGs stable with SSI.  Resume  metformin when closer to discharge.  HTN: BP soft-continue to hold all antihypertensives.  COPD: Continue bronchodilators  Infrarenal abdominal aortic aneurysm 4 mm: Unchanged from prior study-annual follow-up is required.  BMI: Estimated body mass index is 23.53 kg/m as calculated from the following:   Height as of this encounter: '6\' 1"'$  (1.854 m).   Weight as of this encounter: 80.9 kg.   Code status:   Code Status: Full Code   DVT Prophylaxis:IV heparin    Family Communication: Spousee at bedside   Disposition Plan: Status is: Inpatient Remains inpatient appropriate because: Pancreatitis-A-fib RVR-not yet stable for discharge.   Planned Discharge Destination:Home   Diet: Diet Order             Diet clear liquid Room service appropriate? Yes; Fluid consistency: Thin  Diet effective now                     Antimicrobial agents: Anti-infectives (From admission, onward)    Start     Dose/Rate Route Frequency Ordered Stop   08/02/22 1600  piperacillin-tazobactam (ZOSYN) IVPB 3.375 g        3.375 g 12.5 mL/hr over 240 Minutes Intravenous Every 8 hours 08/02/22 1039     08/02/22 1130  piperacillin-tazobactam (ZOSYN) IVPB 4.5 g        4.5 g 200 mL/hr over 30 Minutes Intravenous  Once 08/02/22 1039 08/02/22 1240   07/31/22 0400  meropenem (MERREM) 1 g in sodium chloride 0.9 % 100 mL IVPB  Status:  Discontinued        1 g 200 mL/hr over 30 Minutes Intravenous Every 8 hours 07/31/22 0252 08/02/22 1039   07/30/22 2245  piperacillin-tazobactam (ZOSYN) IVPB 3.375 g  Status:  Discontinued        3.375 g 12.5 mL/hr over 240 Minutes Intravenous Every 8 hours 07/30/22 2239 07/31/22 0239        MEDICATIONS: Scheduled Meds:  insulin aspart  0-6 Units Subcutaneous Q4H   tamsulosin  0.4 mg Oral QPC supper   Continuous Infusions:  amiodarone 30 mg/hr (08/02/22 2249)   heparin 1,050 Units/hr (08/03/22 0514)   piperacillin-tazobactam (ZOSYN)  IV 3.375 g (08/02/22  2333)   PRN Meds:.acetaminophen **OR** acetaminophen, HYDROmorphone (DILAUDID) injection, ondansetron **OR** ondansetron (ZOFRAN) IV, umeclidinium bromide   I have personally reviewed following labs and imaging studies  LABORATORY DATA: CBC: Recent Labs  Lab 07/30/22 2048 07/31/22 0257 08/01/22 0717 08/02/22 0703  WBC 9.1 6.3 12.7* 15.3*  NEUTROABS 8.2*  --  11.6*  --   HGB 13.7 13.0 12.8* 12.7*  HCT 43.7 39.4 38.3* 38.1*  MCV 95.4 92.1 90.8 90.1  PLT 298 248 215 221     Basic Metabolic Panel: Recent Labs  Lab 07/30/22 2048 07/31/22 0257 08/01/22 0717 08/02/22 0703  NA 138 136 139 136  K 4.1 3.8 3.9 3.2*  CL 99 102 102 101  CO2 22 20* 26 23  GLUCOSE 203* 188* 110* 109*  BUN 23 23 28* 24*  CREATININE 1.30* 1.16 1.09 0.98  CALCIUM 9.3 8.8* 8.4* 8.3*     GFR: Estimated Creatinine Clearance: 69.1 mL/min (by C-G formula based on SCr of 0.98 mg/dL).  Liver Function Tests: Recent Labs  Lab 07/30/22 2048 07/31/22 0257 08/01/22 0717 08/02/22 0703  AST 26 24  22 19  ALT '26 23 23 20  '$ ALKPHOS 68 59 53 72  BILITOT 1.3* 0.9 1.1 1.0  PROT 6.3* 5.5* 5.2* 5.5*  ALBUMIN 3.3* 2.8* 2.4* 2.3*    Recent Labs  Lab 07/30/22 2048  LIPASE 21    No results for input(s): "AMMONIA" in the last 168 hours.  Coagulation Profile: No results for input(s): "INR", "PROTIME" in the last 168 hours.  Cardiac Enzymes: No results for input(s): "CKTOTAL", "CKMB", "CKMBINDEX", "TROPONINI" in the last 168 hours.  BNP (last 3 results) No results for input(s): "PROBNP" in the last 8760 hours.  Lipid Profile: No results for input(s): "CHOL", "HDL", "LDLCALC", "TRIG", "CHOLHDL", "LDLDIRECT" in the last 72 hours.  Thyroid Function Tests: No results for input(s): "TSH", "T4TOTAL", "FREET4", "T3FREE", "THYROIDAB" in the last 72 hours.  Anemia Panel: No results for input(s): "VITAMINB12", "FOLATE", "FERRITIN", "TIBC", "IRON", "RETICCTPCT" in the last 72 hours.  Urine analysis:     Component Value Date/Time   COLORURINE YELLOW 04/25/2022 1232   APPEARANCEUR CLEAR 04/25/2022 1232   LABSPEC <1.005 (L) 04/25/2022 1232   PHURINE 6.0 04/25/2022 1232   GLUCOSEU 100 (A) 04/25/2022 1232   HGBUR TRACE (A) 04/25/2022 1232   BILIRUBINUR NEGATIVE 04/25/2022 1232   KETONESUR NEGATIVE 04/25/2022 1232   PROTEINUR NEGATIVE 04/25/2022 1232   UROBILINOGEN 4.0 (H) 10/02/2015 2135   NITRITE POSITIVE (A) 04/25/2022 1232   LEUKOCYTESUR SMALL (A) 04/25/2022 1232    Sepsis Labs: Lactic Acid, Venous    Component Value Date/Time   LATICACIDVEN 6.2 (HH) 07/31/2022 0528    MICROBIOLOGY: No results found for this or any previous visit (from the past 240 hour(s)).  RADIOLOGY STUDIES/RESULTS: CT ABDOMEN PELVIS W CONTRAST  Result Date: 08/02/2022 CLINICAL DATA:  Choledocholithiasis status post endoscopic stone extraction complicated by post ERCP pancreatitis. Progressive abdominal pain. EXAM: CT ABDOMEN AND PELVIS WITH CONTRAST TECHNIQUE: Multidetector CT imaging of the abdomen and pelvis was performed using the standard protocol following bolus administration of intravenous contrast. RADIATION DOSE REDUCTION: This exam was performed according to the departmental dose-optimization program which includes automated exposure control, adjustment of the mA and/or kV according to patient size and/or use of iterative reconstruction technique. CONTRAST:  152m OMNIPAQUE IOHEXOL 300 MG/ML  SOLN COMPARISON:  07/30/2022 FINDINGS: Lower chest: Probable left anterior descending coronary artery stenting. Global cardiac size within normal limits. No pericardial effusion. Small bilateral pleural effusions have developed with associated mild bibasilar compressive atelectasis. Hepatobiliary: Status post cholecystectomy. No intra or extrahepatic biliary ductal dilation. Tiny focus of extraluminal gas adjacent to the distal common duct within the retroperitoneal soft tissues at axial image # 34/2 is seen, unchanged  from prior examination. Additional punctate foci of extraluminal gas within this region appears to have resolved. Inflammatory changes within this region extending into the porta hepatis are again identified, possibly reflecting postsurgical changes related to reported sphincterotomy or changes related to acute interstitial/edematous pancreatitis. No enhancing intrahepatic mass identified. Portal vein is patent. Mild perihepatic ascites appears minimally increased. Pancreas: Previously noted hypoenhancement of the head of the pancreas has resolved and there is normal enhancement of the pancreatic parenchyma. The pancreatic duct is not dilated. As noted above, inflammatory changes adjacent to the head of the pancreas extending into the porta hepatis are again identified and appear unchanged. Inflammatory changes also extend into the right anterior pararenal space and involving the mesenteric fat along the right lateral abdominal wall with associated peritoneal enhancement and thickening. Duodenal diverticulum again noted within the pancreatic head. Spleen: Unremarkable.  Mild perisplenic ascites has developed. Adrenals/Urinary Tract: The adrenal glands are unremarkable. The kidneys are normal save for simple cortical cyst within the interpolar region of the right kidney. No further follow-up imaging is recommended for this lesion. The bladder is unremarkable. Stomach/Bowel: There is circumferential wall thickening and mild surrounding inflammatory stranding involving the gastric antrum and duodenal bulb which may relate to the adjacent inflammatory changes within the retroperitoneum surrounding the head of the pancreas and extending into the porta hepatis. However, a superimposed infectious or inflammatory antritis/duodenitis is difficult to exclude. This appears similar to prior examination. The stomach, small bowel, and large bowel are otherwise unremarkable save for moderate descending and sigmoid colonic  diverticulosis. Mild ascites has increased in the interval since prior examination. Inflammatory changes surround the appendix, similar to prior examination, likely related to tracking changes from the right upper quadrant. No free intraperitoneal gas. Vascular/Lymphatic: Stable 4 cm infrarenal abdominal aortic aneurysm. Moderate aortoiliac atherosclerotic calcification. No pathologic adenopathy within the abdomen and pelvis. Reproductive: Moderate prostatic enlargement. Other: Tiny fat containing umbilical hernia. Bilateral inguinal hernia repair with mesh has been performed. Mild interval increase in diffuse subcutaneous body wall edema in keeping with progressive anasarca Musculoskeletal: Lytic expansile lesion within the left iliac wing is unchanged from prior examination, possibly posttraumatic in etiology. 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 Degenerative changes noted of the thoracolumbar spine. No acute bone abnormality. IMPRESSION: 1. Interval resolution of previously noted hypoenhancement of the head of the pancreas with normal enhancement of the pancreatic parenchyma. 2. Persistent inflammatory changes within the retroperitoneum surrounding the head of the pancreas and extending into the porta hepatis and along the right lateral abdominal wall with associated peritoneal thickening and enhancement. This may reflect changes related to acute interstitial/edematous pancreatitis or reflect post surgical changes related to reported sphincterotomy with extraluminal, retroperitoneal fluid leak. Punctate foci of extraluminal gas within this region has improved in the interval since prior exam. No evidence of pancreatic necrosis. 3. Interval development of small bilateral pleural effusions, mild diffuse body wall edema and mild ascites in keeping with progressive mild anasarca. 4. Stable 4 cm infrarenal abdominal aortic aneurysm. 5. Moderate descending and sigmoid colonic diverticulosis. 6.  Moderate prostatic enlargement. 7. Stable lytic lesion within the left iliac wing, possibly posttraumatic in etiology. Aortic Atherosclerosis (ICD10-I70.0). Electronically Signed   By: Fidela Salisbury M.D.   On: 08/02/2022 01:39   DG Chest Port 1V same Day  Result Date: 08/01/2022 CLINICAL DATA:  [id number]; shortness of breath EXAM: PORTABLE CHEST 1 VIEW COMPARISON:  Radiograph dated July 11, 2022, CT dated July 30, 2022 FINDINGS: The cardiomediastinal silhouette is unchanged in contour.Atherosclerotic calcifications of the aorta. No pleural effusion. No pneumothorax. RIGHT basilar platelike opacity. Visualized abdomen is unremarkable. IMPRESSION: RIGHT basilar platelike opacity most consistent with atelectasis. Electronically Signed   By: Valentino Saxon M.D.   On: 08/01/2022 11:07     LOS: 3 days   Little Ishikawa, DO  [facility]    To contact the attending provider between 7A-7P or the covering provider during after hours 7P-7A, please log into the web site [email or web address] and access using universal [place] password for that web site. If you do not have the password, please call the hospital operator.  08/03/2022, 7:43 AM

## 2022-08-03 NOTE — Evaluation (Signed)
Occupational Therapy Evaluation Patient Details Name: Douglas Hurst. MRN: [id number] DOB: 1941-07-30 Today's Date: 08/03/2022   History of Present Illness Patient is a 81 yo male presenting to the ED with severe abdominal pain with radiation to right shoulder on 07/31/22. Patient had underwent ECRP for removal of stones from biliary and pancreatic duct on 8/3. Admitted with acute pancreatitis. PMH includes: bipolar d/o DMII, HTN, HLD, GERD, COPD ,  Afib ,diastolic heart failure, AAA with interim history of acute cholecystitis   Clinical Impression   Prior to this admission, patient living with wife and requiring increasingly more assist in order to complete ADLs and ambulation. Patient using a walker currently, but states he has been more unsteady recently. Currently, patient is requiring +2 assist to advance gait safely (tremulous with all movement) and mod A in order to compete basic ADL tasks. Patient with recent admission to [facility] in July of 23, with current level of mobility and ADL capability declining. Patient also limited due to significant stomach pain (RN notified for request of pain meds) for evaluation. OT recommending inpatient rehab due to prior to level of function and patient having 24/7 support and is motivated to progress. OT will continue to follow acutely.       Recommendations for follow up therapy are one component of a multi-disciplinary discharge planning process, led by the attending physician.  Recommendations may be updated based on patient status, additional functional criteria and insurance authorization.   Follow Up Recommendations  Acute inpatient rehab (3hours/day)    Assistance Recommended at Discharge Frequent or constant Supervision/Assistance  Patient can return home with the following Two people to help with walking and/or transfers;A lot of help with bathing/dressing/bathroom;Assistance with cooking/housework;Direct supervision/assist for medications  management;Assist for transportation;Help with stairs or ramp for entrance    Functional Status Assessment  Patient has had a recent decline in their functional status and demonstrates the ability to make significant improvements in function in a reasonable and predictable amount of time.  Equipment Recommendations  Other (comment) (Will continue to assess)    Recommendations for Other Services Rehab consult     Precautions / Restrictions Precautions Precautions: Fall Restrictions Weight Bearing Restrictions: No      Mobility Bed Mobility Overal bed mobility: Needs Assistance Bed Mobility: Supine to Sit, Sit to Supine     Supine to sit: Mod assist Sit to supine: Mod assist   General bed mobility comments: Patient requiring multi-modal cues in order to complete, reaching out for OTs pant leg in attempt to pull up instead of grab bar, unable to bring legs back into bed without assitance    Transfers Overall transfer level: Needs assistance Equipment used: Rolling walker (2 wheels) Transfers: Sit to/from Stand Sit to Stand: Min assist, Mod assist           General transfer comment: min-mod A depending on level of fatigue, cues for hand placement and increased time to complete, unable to take more than 3 side steps to [facility] prior to fatigue and LOB      Balance Overall balance assessment: Needs assistance Sitting-balance support: Bilateral upper extremity supported, Feet supported Sitting balance-Leahy Scale: Poor     Standing balance support: Bilateral upper extremity supported, During functional activity, Reliant on assistive device for balance Standing balance-Leahy Scale: Poor Standing balance comment: tremulous with all movement, especially in standing  ADL either performed or assessed with clinical judgement   ADL Overall ADL's : Needs assistance/impaired Eating/Feeding: Set up;Sitting   Grooming: Minimal  assistance;Sitting   Upper Body Bathing: Minimal assistance;Sitting   Lower Body Bathing: Moderate assistance;Maximal assistance;Sitting/lateral leans;Sit to/from stand   Upper Body Dressing : Minimal assistance;Sitting   Lower Body Dressing: Moderate assistance;Maximal assistance;Sitting/lateral leans;Sit to/from stand   Toilet Transfer: Minimal assistance;Cueing for safety;Cueing for sequencing;Rolling walker (2 wheels) Toilet Transfer Details (indicate cue type and reason): increased time and tremulous with all movement, unable to progress steps safely to attempt due to LOB into bed x1 Toileting- Clothing Manipulation and Hygiene: Maximal assistance;Total assistance;Sitting/lateral lean Toileting - Clothing Manipulation Details (indicate cue type and reason): patients condom cath off upon arrival requiring peri care     Functional mobility during ADLs: Moderate assistance;Cueing for safety;Cueing for sequencing;Rolling walker (2 wheels) General ADL Comments: Patient presenting with increased pain, decreased activity tolerance, and unable to progress with mobility safely without increased assist     Vision Baseline Vision/History: 1 Wears glasses (Reading) Ability to See in Adequate Light: 0 Adequate Patient Visual Report: No change from baseline       Perception     Praxis      Pertinent Vitals/Pain Pain Assessment Pain Assessment: 0-10 Pain Score: 5  Pain Location: stomach Pain Descriptors / Indicators: Discomfort, Grimacing, Guarding Pain Intervention(s): Limited activity within patient's tolerance, Monitored during session, Repositioned     Hand Dominance Right   Extremity/Trunk Assessment Upper Extremity Assessment Upper Extremity Assessment: Generalized weakness   Lower Extremity Assessment Lower Extremity Assessment: Defer to PT evaluation   Cervical / Trunk Assessment Cervical / Trunk Assessment: Kyphotic (Minimally)   Communication  Communication Communication: No difficulties   Cognition Arousal/Alertness: Awake/alert Behavior During Therapy: WFL for tasks assessed/performed Overall Cognitive Status: Within Functional Limits for tasks assessed                                 General Comments: Mild cognitive impairments noted but alert and oriented to situation     General Comments       Exercises     Shoulder Instructions      Home Living Family/patient expects to be discharged to:: Private residence Living Arrangements: Spouse/significant other Available Help at Discharge: Family;Available 24 hours/day Type of Home: House Home Access: Stairs to enter [employer] of Steps: 3 Entrance Stairs-Rails: None Home Layout: One level     Bathroom Shower/Tub: Tub/shower unit;Walk-in shower   Bathroom Toilet: Standard     Home Equipment: Conservation officer, nature (2 wheels);Wheelchair - manual;BSC/3in1   Additional Comments: Sponge bathes at baselin      Prior Functioning/Environment Prior Level of Function : Needs assist             Mobility Comments: Using a walker but is progressively getting more difficult and unsteady ADLs Comments: Needing assist with bathing and toileting from wife        OT Problem List: Decreased strength;Decreased activity tolerance;Impaired balance (sitting and/or standing);Decreased cognition;Decreased knowledge of use of DME or AE;Pain      OT Treatment/Interventions: Self-care/ADL training;Therapeutic exercise;DME and/or AE instruction;Therapeutic activities;Balance training;Patient/family education    OT Goals(Current goals can be found in the care plan section) Acute Rehab OT Goals Patient Stated Goal: to be in less pain OT Goal Formulation: With patient Time For Goal Achievement: 08/17/22 Potential to Achieve Goals: Good  OT Frequency: Min 3X/week  Co-evaluation              AM-PAC OT "6 Clicks" Daily Activity     Outcome Measure  Help from another person eating meals?: A Little Help from another person taking care of personal grooming?: A Little Help from another person toileting, which includes using toliet, bedpan, or urinal?: A Lot Help from another person bathing (including washing, rinsing, drying)?: A Lot Help from another person to put on and taking off regular upper body clothing?: A Little Help from another person to put on and taking off regular lower body clothing?: A Lot 6 Click Score: 15   End of Session Equipment Utilized During Treatment: Gait belt;Rolling walker (2 wheels) Nurse Communication: Mobility status;Patient requests pain meds;Other (comment) (Condom cath off)  Activity Tolerance: Patient limited by fatigue;Patient limited by pain Patient left: in bed;with call bell/phone within reach;with bed alarm set  OT Visit Diagnosis: Unsteadiness on feet (R26.81);Muscle weakness (generalized) (M62.81)                Time: 4782-9562 OT Time Calculation (min): 22 min Charges:  OT General Charges $OT Visit: 1 Visit OT Evaluation $OT Eval Moderate Complexity: 1 Mod  Corinne Ports E. Tamyka Bezio, OTR/L Acute Rehabilitation Services [phone]   Ascencion Dike 08/03/2022, 9:56 AM

## 2022-08-04 DIAGNOSIS — R932 Abnormal findings on diagnostic imaging of liver and biliary tract: Secondary | ICD-10-CM | POA: Diagnosis not present

## 2022-08-04 DIAGNOSIS — R1011 Right upper quadrant pain: Secondary | ICD-10-CM | POA: Diagnosis not present

## 2022-08-04 DIAGNOSIS — R1013 Epigastric pain: Secondary | ICD-10-CM | POA: Diagnosis not present

## 2022-08-04 DIAGNOSIS — K859 Acute pancreatitis without necrosis or infection, unspecified: Secondary | ICD-10-CM | POA: Diagnosis not present

## 2022-08-04 DIAGNOSIS — R1084 Generalized abdominal pain: Secondary | ICD-10-CM | POA: Diagnosis not present

## 2022-08-04 LAB — GLUCOSE, CAPILLARY
Glucose-Capillary: 107 mg/dL — ABNORMAL HIGH (ref 70–99)
Glucose-Capillary: 115 mg/dL — ABNORMAL HIGH (ref 70–99)
Glucose-Capillary: 136 mg/dL — ABNORMAL HIGH (ref 70–99)
Glucose-Capillary: 83 mg/dL (ref 70–99)
Glucose-Capillary: 89 mg/dL (ref 70–99)
Glucose-Capillary: 93 mg/dL (ref 70–99)

## 2022-08-04 LAB — CBC
HCT: 38.1 % — ABNORMAL LOW (ref 39.0–52.0)
Hemoglobin: 12.7 g/dL — ABNORMAL LOW (ref 13.0–17.0)
MCH: 30 pg (ref 26.0–34.0)
MCHC: 33.3 g/dL (ref 30.0–36.0)
MCV: 90.1 fL (ref 80.0–100.0)
Platelets: 160 10*3/uL (ref 150–400)
RBC: 4.23 MIL/uL (ref 4.22–5.81)
RDW: 16.3 % — ABNORMAL HIGH (ref 11.5–15.5)
WBC: 7.7 10*3/uL (ref 4.0–10.5)
nRBC: 0 % (ref 0.0–0.2)

## 2022-08-04 LAB — HEPARIN LEVEL (UNFRACTIONATED): Heparin Unfractionated: 0.36 IU/mL (ref 0.30–0.70)

## 2022-08-04 NOTE — Progress Notes (Signed)
ANTICOAGULATION CONSULT NOTE - follow-up  Pharmacy Consult for heparin infusion Indication: atrial fibrillation  No Known Allergies  Patient Measurements: Height: '6\' 1"'$  (185.4 cm) Weight: 87.3 kg (192 lb 7.4 oz) IBW/kg (Calculated) : 79.9 Heparin Dosing Weight: 80.7  Vital Signs: Temp: 97.8 F (36.6 C) (08/08 0309) Temp Source: Oral (08/08 0309) BP: 138/77 (08/08 0309) Pulse Rate: 84 (08/08 0309)  Labs: Recent Labs    08/01/22 1832 08/01/22 1832 08/01/22 2158 08/01/22 2338 08/02/22 0703 08/02/22 1453 08/03/22 0819 08/04/22 0509  HGB  --    < >  --   --  12.7*  --  12.3* 12.7*  HCT  --   --   --   --  38.1*  --  36.9* 38.1*  PLT  --   --   --   --  221  --  161 160  APTT 121*  --   --   --  73*  --   --   --   HEPARINUNFRC >1.10*  --   --   --  0.67 0.41 0.51 0.36  CREATININE  --   --   --   --  0.98  --  0.79  --   TROPONINIHS  --   --  78* 65*  --   --   --   --    < > = values in this interval not displayed.     Estimated Creatinine Clearance: 84.6 mL/min (by C-G formula based on SCr of 0.79 mg/dL).    Medications:  Infusions:   heparin 1,050 Units/hr (08/04/22 0315)   piperacillin-tazobactam (ZOSYN)  IV 3.375 g (08/04/22 0004)    Assessment: Douglas Hurst is a 81 y/o male whose pertinent medication history includes Afib and CHF. Pharmacy has been consulted to initiate a heparin infusion, no bolus per MD. His last dose of Eliquis was charted as 7/30.   Heparin level therapeutic this AM  Goal of Therapy:  Heparin level: 0.3-0.7 Monitor platelets by anticoagulation protocol: Yes    Plan:  Continue heparin infusion at 1050 units/hr Continue daily heparin levels with AM labs  Continue to monitor H&H and platelets via CBC  Thank you Anette Guarneri, PharmD 08/04/2022 7:30 AM  Contact: [phone] after 3 PM

## 2022-08-04 NOTE — Progress Notes (Signed)
Inpatient Rehab Coordinator Note:  I met with patient and his wife at bedside to discuss CIR recommendations and goals/expectations of CIR stay.  We reviewed 3 hrs/day of therapy, physician follow up, and average length of stay 2 weeks (dependent upon progress) with goals of supervision.  Per spouse, she was providing "a lot" of help for ADLs since hospitalization in May after a percutaneous cholecystostomy tube placement.  Underwent cholecystectomy and failed ERCP on 7/12 per Dr. Thermon Leyland, followed by [facility] for rehab.  Per wife, [employer] cut SNF stay after 2 weeks.  He underwent ERCP at [facility] between SNF d/c on 7/31 and readmit to [facility] on 8/3.  She feels pt needs to be able to reach supervision level for mobility and ADLs in order for her to reasonably provide care for him at home.  We discussed timing of potential transfer to CIR and that it will depend on medical stability and approval from [facility].  Note still on CLD with potential for cortrak if not able to advance.  Will follow for medical progression and readiness to start insurance authorization.    Shann Medal, PT, DPT Admissions Coordinator [phone] 08/04/22  12:05 PM

## 2022-08-04 NOTE — Progress Notes (Signed)
Subjective: Abdominal pain unchanged (not worse, not better) from yesterday. Has gotten out-of-bed into chair at bedside.  Objective: Vital signs in last 24 hours: Temp:  [97.8 F (36.6 C)-98.8 F (37.1 C)] 98 F (36.7 C) (08/08 0805) Pulse Rate:  [84-90] 88 (08/08 0805) Resp:  [12-21] 20 (08/08 0805) BP: (138-152)/(68-83) 143/68 (08/08 0805) SpO2:  [97 %-99 %] 99 % (08/08 0805) Weight:  [87.3 kg] 87.3 kg (08/08 0500) Weight change: 6.4 kg Last BM Date : 08/02/22 (as per patient)  PE: GEN:  NAD ABD:  Soft, mild epigastric tenderness, no peritonitis NEURO:  A/O, no encephalopathy  Lab Results: CBC    Component Value Date/Time   WBC 7.7 08/04/2022 0509   RBC 4.23 08/04/2022 0509   HGB 12.7 (L) 08/04/2022 0509   HGB 13.5 06/15/2022 1006   HCT 38.1 (L) 08/04/2022 0509   HCT 41.3 06/15/2022 1006   PLT 160 08/04/2022 0509   PLT 219 06/15/2022 1006   MCV 90.1 08/04/2022 0509   MCV 92 06/15/2022 1006   MCH 30.0 08/04/2022 0509   MCHC 33.3 08/04/2022 0509   RDW 16.3 (H) 08/04/2022 0509   RDW 13.6 06/15/2022 1006   LYMPHSABS 0.5 (L) 08/01/2022 0717   MONOABS 0.3 08/01/2022 0717   EOSABS 0.0 08/01/2022 0717   BASOSABS 0.0 08/01/2022 0717  CMP     Component Value Date/Time   NA 139 08/03/2022 0819   NA 139 06/15/2022 1006   K 3.4 (L) 08/03/2022 0819   CL 105 08/03/2022 0819   CO2 26 08/03/2022 0819   GLUCOSE 95 08/03/2022 0819   BUN 17 08/03/2022 0819   BUN 12 06/15/2022 1006   CREATININE 0.79 08/03/2022 0819   CALCIUM 8.3 (L) 08/03/2022 0819   PROT 5.0 (L) 08/03/2022 0819   PROT 5.8 (L) 06/15/2022 1006   ALBUMIN 2.0 (L) 08/03/2022 0819   ALBUMIN 3.7 06/15/2022 1006   AST 18 08/03/2022 0819   ALT 17 08/03/2022 0819   ALKPHOS 64 08/03/2022 0819   BILITOT 1.0 08/03/2022 0819   BILITOT 0.5 06/15/2022 1006   GFRNONAA >60 08/03/2022 0819   GFRAA >60 10/02/2015 1956   Assessment:   Post ERCP pancreatitis.  About same today vs yesterday, ongoing pain requiring IV  analgesics.  Plan:   Hydration, clear liquid diet only, judicious analgesics, follow labs. OOBTC, ambulate halls as possible. [facility] will follow.     Landry Dyke 08/04/2022, 12:13 PM   Cell [phone] If no answer or after 5 PM call [phone]

## 2022-08-04 NOTE — Progress Notes (Signed)
PROGRESS NOTE        PATIENT DETAILS Name: Douglas Hurst. Age: 81 y.o. Sex: male Date of Birth: Jul 27, 1941 Admit Date: 07/30/2022 Admitting Physician Clance Boll, MD OYD:XAJOINO, [name], MD  Brief Summary: Patient is a 81 y.o.  male with history of PAF on Eliquis, HTN, HLD, COPD, chronic HFrEF (recovered EF)-with recent history of cholecystitis-s/p cholecystectomy but with retained CBD stone.  ERCP here was unsuccessful.  Patient was referred to [facility] underwent advanced endoscopy via transduodenal approach for ERCP on 8/3 at [facility] with Dr. Okey Dupre -he subsequently presented to the ED with severe abdominal pain-was found to have pancreatitis and admitted to the hospitalist service.  Significant events: 8/3>> admit to [facility]-abdominal pain after ERCP at [facility]-found to have acute pancreatitis 8/5>> A-fib RVR-no response to IV Lopressor-blood pressure soft-amiodarone infusion started.  After discussion with GI-heparin started without bolus. 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 8/5>>Repeat urgent CT-AP overnight due to worsening pain without over changes  Consults: GI Phone consult-cards-Dr Crenshaw  Subjective: Lying comfortably in bed-denies any chest pain or shortness of breath. Pelvic pain with urge to void ongoing but improving on flomax - able to urinate overnight/this am without incident.  Objective: Vitals: Blood pressure 138/77, pulse 84, temperature 97.8 F (36.6 C), temperature source Oral, resp. rate 12, height '6\' 1"'$  (1.854 m), weight 87.3 kg, SpO2 97 %.   Exam: Gen Exam:Alert awake-not in any distress HEENT:atraumatic, normocephalic Chest: B/L clear to auscultation  anteriorly CVS:S1S2 irregular Abdomen:soft -tender in the upper abdomen without any peritoneal signs. Extremities:no edema Neurology: Non focal Skin: no rash  Pertinent Labs/Radiology:    Latest Ref Rng & Units 08/04/2022    5:09 AM 08/03/2022    8:19 AM 08/02/2022    7:03 AM  CBC  WBC 4.0 - 10.5 K/uL 7.7  10.4  15.3   Hemoglobin 13.0 - 17.0 g/dL 12.7  12.3  12.7   Hematocrit 39.0 - 52.0 % 38.1  36.9  38.1   Platelets 150 - 400 K/uL 160  161  221     Lab Results  Component Value Date   NA 139 08/03/2022   K 3.4 (L) 08/03/2022   CL 105 08/03/2022   CO2 26 08/03/2022      Assessment/Plan:  Acute pancreatitis (possibly necrotizing)-duodenitis with contained duodenal perforation (transduodenal approach to puncture extrahepatic bile duct to place guidewire into duodenum-subsequently ERCP scope inserted and stone removed):  Continues to have epigastric pain-transition to zosyn from meropenem Per GI-okay to advance to clear liquids.  Repeat CT shows stable disease  PAF with RVR:  Provoked by pancreatitis/pain/acute illness-given 1 dose of IV Lopressor-minimal change in heart rate-but now blood pressure dropping/soft-discussed with cardiology-Dr. Stanford Breed over the phone-we will start amiodarone infusion (on oral amiodarone at home).  After discussion with GI-start heparin infusion without bolus (Eliquis held since 7/31 for ERCP).  Continue to monitor closely on telemetry.  When blood pressure more acceptable-we will start oral metoprolol.  Ambulatory dysfunction Multifactorial - follow PT/OT recs  AKI, resolved Complicated by BPH and lower urinary tract obstruction  Resolved-likely hemodynamically mediated. Resume flomax - symptoms improving somewhat already; not back to baseline  Chronic HFrEF with recovered EF: Some mild shortness of breath-but also anxious-no obvious signs of volume overload-monitor closely while on IVF for pancreatitis.    DM-2: CBGs stable with SSI.  Resume  metformin when closer to discharge.  HTN: BP soft-continue to hold all antihypertensives.  COPD: Continue bronchodilators  Infrarenal abdominal aortic aneurysm 4 mm: Unchanged from prior study-annual follow-up is required.  BMI: Estimated body mass index is 25.39 kg/m as calculated from the following:   Height as of this encounter: '6\' 1"'$  (1.854 m).   Weight as of this encounter: 87.3 kg.   Code status:   Code Status: Full Code   DVT Prophylaxis:IV heparin    Family Communication: Spousee at bedside   Disposition Plan: Status is: Inpatient Remains inpatient appropriate because: Pancreatitis-A-fib RVR-not yet stable for discharge.   Planned Discharge Destination:Home   Diet: Diet Order             Diet clear liquid Room service appropriate? Yes; Fluid consistency: Thin  Diet effective now                     Antimicrobial agents: Anti-infectives (From admission, onward)    Start     Dose/Rate Route Frequency Ordered Stop   08/02/22 1600  piperacillin-tazobactam (ZOSYN) IVPB 3.375 g        3.375 g 12.5 mL/hr over 240 Minutes Intravenous Every 8 hours 08/02/22 1039     08/02/22 1130  piperacillin-tazobactam (ZOSYN) IVPB 4.5 g        4.5 g 200 mL/hr over 30 Minutes Intravenous  Once 08/02/22 1039 08/02/22 1240   07/31/22 0400  meropenem (MERREM) 1 g in sodium chloride 0.9 % 100 mL IVPB  Status:  Discontinued        1 g 200 mL/hr over 30 Minutes Intravenous Every 8 hours 07/31/22 0252 08/02/22 1039   07/30/22 2245  piperacillin-tazobactam (ZOSYN) IVPB 3.375 g  Status:  Discontinued        3.375 g 12.5 mL/hr over 240 Minutes Intravenous Every 8 hours 07/30/22 2239 07/31/22 0239        MEDICATIONS: Scheduled Meds:  amiodarone  200 mg Oral Daily   insulin aspart  0-6 Units Subcutaneous Q4H   tamsulosin  0.4 mg Oral QPC supper   Continuous Infusions:  heparin 1,050 Units/hr (08/04/22 0315)   piperacillin-tazobactam (ZOSYN)  IV 3.375 g (08/04/22 0004)    PRN Meds:.acetaminophen **OR** acetaminophen, HYDROmorphone (DILAUDID) injection, ondansetron **OR** ondansetron (ZOFRAN) IV, umeclidinium bromide   I have personally reviewed following labs and imaging studies  LABORATORY DATA: CBC: Recent Labs  Lab 07/30/22 2048 07/31/22 0257 08/01/22 0717 08/02/22 0703 08/03/22 0819 08/04/22 0509  WBC 9.1 6.3 12.7* 15.3* 10.4 7.7  NEUTROABS 8.2*  --  11.6*  --   --   --   HGB 13.7 13.0 12.8* 12.7* 12.3* 12.7*  HCT 43.7 39.4 38.3* 38.1* 36.9* 38.1*  MCV 95.4 92.1 90.8 90.1 88.7 90.1  PLT 298 248 215 221 161 160     Basic Metabolic Panel: Recent Labs  Lab 07/30/22 2048 07/31/22 0257 08/01/22 0717 08/02/22 0703 08/03/22 0819  NA 138 136 139 136 139  K 4.1 3.8 3.9 3.2* 3.4*  CL 99 102 102 101 105  CO2 22 20* '26 23 26  '$ GLUCOSE 203* 188* 110* 109* 95  BUN 23 23 28* 24* 17  CREATININE 1.30* 1.16 1.09 0.98 0.79  CALCIUM 9.3 8.8* 8.4* 8.3* 8.3*  MG  --   --   --   --  1.7     GFR: Estimated Creatinine Clearance: 84.6 mL/min (by C-G formula based on SCr of 0.79 mg/dL).  Liver Function Tests: Recent Labs  Lab 07/30/22 2048 07/31/22 0257 08/01/22 0717 08/02/22 0703 08/03/22 0819  AST '26 24 22 19 18  '$ ALT '26 23 23 20 17  '$ ALKPHOS 68 59 53 72 64  BILITOT 1.3* 0.9 1.1 1.0 1.0  PROT 6.3* 5.5* 5.2* 5.5* 5.0*  ALBUMIN 3.3* 2.8* 2.4* 2.3* 2.0*    Recent Labs  Lab 07/30/22 2048  LIPASE 21    No results for input(s): "AMMONIA" in the last 168 hours.  Coagulation Profile: No results for input(s): "INR", "PROTIME" in the last 168 hours.  Cardiac Enzymes: No results for input(s): "CKTOTAL", "CKMB", "CKMBINDEX", "TROPONINI" in the last 168 hours.  BNP (last 3 results) No results for input(s): "PROBNP" in the last 8760 hours.  Lipid Profile: No results for input(s): "CHOL", "HDL", "LDLCALC", "TRIG", "CHOLHDL", "LDLDIRECT" in the last 72 hours.  Thyroid Function Tests: No results for input(s): "TSH", "T4TOTAL", "FREET4",  "T3FREE", "THYROIDAB" in the last 72 hours.  Anemia Panel: No results for input(s): "VITAMINB12", "FOLATE", "FERRITIN", "TIBC", "IRON", "RETICCTPCT" in the last 72 hours.  Urine analysis:    Component Value Date/Time   COLORURINE YELLOW 04/25/2022 1232   APPEARANCEUR CLEAR 04/25/2022 1232   LABSPEC <1.005 (L) 04/25/2022 1232   PHURINE 6.0 04/25/2022 1232   GLUCOSEU 100 (A) 04/25/2022 1232   HGBUR TRACE (A) 04/25/2022 1232   BILIRUBINUR NEGATIVE 04/25/2022 1232   KETONESUR NEGATIVE 04/25/2022 1232   PROTEINUR NEGATIVE 04/25/2022 1232   UROBILINOGEN 4.0 (H) 10/02/2015 2135   NITRITE POSITIVE (A) 04/25/2022 1232   LEUKOCYTESUR SMALL (A) 04/25/2022 1232    Sepsis Labs: Lactic Acid, Venous    Component Value Date/Time   LATICACIDVEN 6.2 (HH) 07/31/2022 0528    MICROBIOLOGY: No results found for this or any previous visit (from the past 240 hour(s)).  RADIOLOGY STUDIES/RESULTS: No results found.   LOS: 4 days   Little Ishikawa, DO  [facility]    To contact the attending provider between 7A-7P or the covering provider during after hours 7P-7A, please log into the web site [email or web address] and access using universal [place] password for that web site. If you do not have the password, please call the hospital operator.  08/04/2022, 7:36 AM

## 2022-08-05 ENCOUNTER — Other Ambulatory Visit: Payer: Self-pay

## 2022-08-05 DIAGNOSIS — K859 Acute pancreatitis without necrosis or infection, unspecified: Secondary | ICD-10-CM | POA: Diagnosis not present

## 2022-08-05 DIAGNOSIS — R932 Abnormal findings on diagnostic imaging of liver and biliary tract: Secondary | ICD-10-CM | POA: Diagnosis not present

## 2022-08-05 DIAGNOSIS — R1084 Generalized abdominal pain: Secondary | ICD-10-CM | POA: Diagnosis not present

## 2022-08-05 DIAGNOSIS — R1011 Right upper quadrant pain: Secondary | ICD-10-CM | POA: Diagnosis not present

## 2022-08-05 DIAGNOSIS — R1013 Epigastric pain: Secondary | ICD-10-CM | POA: Diagnosis not present

## 2022-08-05 LAB — CBC
HCT: 34 % — ABNORMAL LOW (ref 39.0–52.0)
Hemoglobin: 11.5 g/dL — ABNORMAL LOW (ref 13.0–17.0)
MCH: 30.1 pg (ref 26.0–34.0)
MCHC: 33.8 g/dL (ref 30.0–36.0)
MCV: 89 fL (ref 80.0–100.0)
Platelets: 134 10*3/uL — ABNORMAL LOW (ref 150–400)
RBC: 3.82 MIL/uL — ABNORMAL LOW (ref 4.22–5.81)
RDW: 16 % — ABNORMAL HIGH (ref 11.5–15.5)
WBC: 6.9 10*3/uL (ref 4.0–10.5)
nRBC: 0 % (ref 0.0–0.2)

## 2022-08-05 LAB — GLUCOSE, CAPILLARY
Glucose-Capillary: 127 mg/dL — ABNORMAL HIGH (ref 70–99)
Glucose-Capillary: 130 mg/dL — ABNORMAL HIGH (ref 70–99)
Glucose-Capillary: 139 mg/dL — ABNORMAL HIGH (ref 70–99)
Glucose-Capillary: 180 mg/dL — ABNORMAL HIGH (ref 70–99)
Glucose-Capillary: 93 mg/dL (ref 70–99)
Glucose-Capillary: 93 mg/dL (ref 70–99)
Glucose-Capillary: 98 mg/dL (ref 70–99)

## 2022-08-05 LAB — HEPARIN LEVEL (UNFRACTIONATED): Heparin Unfractionated: 0.31 IU/mL (ref 0.30–0.70)

## 2022-08-05 MED ORDER — HYDROMORPHONE HCL 1 MG/ML IJ SOLN
0.5000 mg | INTRAMUSCULAR | Status: DC | PRN
  Administered 2022-08-05 – 2022-08-22 (×43): 0.5 mg via INTRAVENOUS
  Filled 2022-08-05 (×43): qty 0.5

## 2022-08-05 MED ORDER — SENNOSIDES-DOCUSATE SODIUM 8.6-50 MG PO TABS
1.0000 | ORAL_TABLET | Freq: Two times a day (BID) | ORAL | Status: DC
Start: 2022-08-05 — End: 2022-08-11
  Administered 2022-08-05 – 2022-08-11 (×7): 1 via ORAL
  Filled 2022-08-05 (×10): qty 1

## 2022-08-05 MED ORDER — HYDROCODONE-ACETAMINOPHEN 5-325 MG PO TABS
1.0000 | ORAL_TABLET | Freq: Four times a day (QID) | ORAL | Status: DC | PRN
  Administered 2022-08-05 – 2022-08-26 (×21): 1 via ORAL
  Filled 2022-08-05 (×21): qty 1

## 2022-08-05 MED ORDER — TAMSULOSIN HCL 0.4 MG PO CAPS
0.8000 mg | ORAL_CAPSULE | Freq: Every day | ORAL | Status: DC
  Administered 2022-08-05 – 2022-09-08 (×35): 0.8 mg via ORAL
  Filled 2022-08-05 (×36): qty 2

## 2022-08-05 MED ORDER — TAMSULOSIN HCL 0.4 MG PO CAPS
0.8000 mg | ORAL_CAPSULE | Freq: Every day | ORAL | Status: DC
Start: 2022-08-05 — End: 2022-08-05

## 2022-08-05 MED ORDER — POLYETHYLENE GLYCOL 3350 17 G PO PACK
17.0000 g | PACK | Freq: Two times a day (BID) | ORAL | Status: DC
  Administered 2022-08-05 (×2): 17 g via ORAL
  Filled 2022-08-05 (×2): qty 1

## 2022-08-05 NOTE — Progress Notes (Signed)
Physical Therapy Treatment Patient Details Name: Douglas Hurst. MRN: [id number] DOB: 1941/08/18 Today's Date: 08/05/2022   History of Present Illness Patient is a 81 yo male presenting to the ED with severe abdominal pain with radiation to right shoulder on 07/31/22. Patient had underwent ECRP for removal of stones from biliary and pancreatic duct on 8/3. Admitted with acute pancreatitis. PMH includes: bipolar d/o DMII, HTN, HLD, GERD, COPD ,  Afib ,diastolic heart failure, AAA with interim history of acute cholecystitis    PT Comments    Pt received in recliner, eager to return to bed from chair PTA called in to room to assist, NT present as well due to multiple lines.  Pt able to perform transfers with decreased assist in afternoon session compared with AM, needing minA for pivotal steps from chair>bed and impulsive to sit when fatigued, but able to maintain standing 2-3 minutes at RW with tactile cues due to need to rearrange lines for safety prior to him sitting back down. Pt performed seated/supine LE exercises and instructed on use of Incentive Spirometer. Pt continues to benefit from PT services to progress toward functional mobility goals.   Recommendations for follow up therapy are one component of a multi-disciplinary discharge planning process, led by the attending physician.  Recommendations may be updated based on patient status, additional functional criteria and insurance authorization.  Follow Up Recommendations  Acute inpatient rehab (3hours/day)     Assistance Recommended at Discharge Frequent or constant Supervision/Assistance  Patient can return home with the following A lot of help with walking and/or transfers;A lot of help with bathing/dressing/bathroom;Assistance with cooking/housework;Direct supervision/assist for medications management;Direct supervision/assist for financial management;Assist for transportation;Help with stairs or ramp for entrance   Equipment  Recommendations  None recommended by PT    Recommendations for Other Services Rehab consult     Precautions / Restrictions Precautions Precautions: Fall Restrictions Weight Bearing Restrictions: No     Mobility  Bed Mobility Overal bed mobility: Needs Assistance Bed Mobility: Rolling, Sit to Sidelying Rolling: Min guard Sidelying to sit: Min assist     Sit to sidelying: Min assist, +2 for safety/equipment General bed mobility comments: cues for reverse log roll for abdominal protection/pain mgmt, pt benefits from visual and verbal cues    Transfers Overall transfer level: Needs assistance Equipment used: Rolling walker (2 wheels) Transfers: Sit to/from Stand, Bed to chair/wheelchair/BSC Sit to Stand: Min assist, +2 safety/equipment, +2 physical assistance   Step pivot transfers: Min assist, +2 safety/equipment       General transfer comment: from chair with RW support, NT in room also assisting for supervision with lines    Ambulation/Gait Ambulation/Gait assistance: Min assist, +2 safety/equipment Gait Distance (Feet): 5 Feet Assistive device: Rolling walker (2 wheels) Gait Pattern/deviations: Leaning posteriorly, Trunk flexed, Narrow base of support, Decreased dorsiflexion - right, Decreased dorsiflexion - left, Shuffle       General Gait Details: distance limited due to pt c/o fatigue after stepping from chair>bed, pt agreeable to perform seated exercises at EOB after transfer but refusing ambulation away from the bed      Balance Overall balance assessment: Needs assistance Sitting-balance support: Feet supported, Single extremity supported Sitting balance-Leahy Scale: Fair     Standing balance support: Bilateral upper extremity supported, During functional activity, Reliant on assistive device for balance Standing balance-Leahy Scale: Poor Standing balance comment: reliant on RW and external support                Cognition Arousal/Alertness:  Awake/alert Behavior During Therapy: Flat affect Overall Cognitive Status: Within Functional Limits for tasks assessed                    Exercises General Exercises - Lower Extremity Ankle Circles/Pumps: AROM, Both, 10 reps, Supine Heel Slides: AROM, Both, 5 reps, Supine Hip ABduction/ADduction: AROM, AAROM, Both, 10 reps, Supine Other Exercises Other Exercises: seated BLE AROM: LAQ, hip flexion (visual/verbal cues) x10 reps ea Other Exercises: IS x 10 reps, achieves 200-750 mL    General Comments General comments (skin integrity, edema, etc.): SpO2 90% and greater on 2L O2 [place], poor pleth signal at times.      Pertinent Vitals/Pain Pain Assessment Pain Assessment: Faces Faces Pain Scale: Hurts little more Pain Location: abdomen (more to R side) Pain Descriptors / Indicators: Grimacing, Guarding Pain Intervention(s): Monitored during session, Repositioned, Limited activity within patient's tolerance     PT Goals (current goals can now be found in the care plan section) Acute Rehab PT Goals Patient Stated Goal: to get stronger and pain reduced PT Goal Formulation: With patient/family Time For Goal Achievement: 08/17/22 Potential to Achieve Goals: Good Progress towards PT goals: Progressing toward goals    Frequency    Min 3X/week      PT Plan Current plan remains appropriate    Co-evaluation PT/OT/SLP Co-Evaluation/Treatment: Yes Reason for Co-Treatment: For patient/therapist safety;To address functional/ADL transfers PT goals addressed during session: Mobility/safety with mobility;Proper use of DME;Balance;Strengthening/ROM        AM-PAC PT "6 Clicks" Mobility   Outcome Measure  Help needed turning from your back to your side while in a flat bed without using bedrails?: A Little Help needed moving from lying on your back to sitting on the side of a flat bed without using bedrails?: A Lot (mod cues) Help needed moving to and from a bed to a chair  (including a wheelchair)?: A Lot (mod cues) Help needed standing up from a chair using your arms (e.g., wheelchair or bedside chair)?: A Lot (mod cues) Help needed to walk in hospital room?: Total Help needed climbing 3-5 steps with a railing? : Total 6 Click Score: 11    End of Session Equipment Utilized During Treatment: Gait belt;Oxygen Activity Tolerance: Patient tolerated treatment well;Patient limited by pain Patient left: with call bell/phone within reach;in bed;with bed alarm set;Other (comment) (heels floated) Nurse Communication: Mobility status;Need for lift equipment (+2 with Stedy or RW to pivot back) PT Visit Diagnosis: Muscle weakness (generalized) (M62.81);Difficulty in walking, not elsewhere classified (R26.2);Other abnormalities of gait and mobility (R26.89);Pain Pain - Right/Left: Right     Time: 1550-1606 PT Time Calculation (min) (ACUTE ONLY): 16 min  Charges:  $Therapeutic Exercise: 8-22 mins                    Tyger Oka P., PTA Acute Rehabilitation Services Secure Chat Preferred 9a-5:30pm Office: [place] 08/05/2022, 4:14 PM

## 2022-08-05 NOTE — Progress Notes (Signed)
Physical Therapy Treatment Patient Details Name: Douglas Hurst. MRN: [id number] DOB: 18-Nov-1941 Today's Date: 08/05/2022   History of Present Illness Patient is a 81 yo male presenting to the ED with severe abdominal pain with radiation to right shoulder on 07/31/22. Patient had underwent ECRP for removal of stones from biliary and pancreatic duct on 8/3. Admitted with acute pancreatitis. PMH includes: bipolar d/o DMII, HTN, HLD, GERD, COPD ,  Afib ,diastolic heart failure, AAA with interim history of acute cholecystitis    PT Comments    Pt received in supine, agreeable to therapy session with encouragement after premedication by RN, pt making progress toward goals with good participation in transfer training. Gait distance limited due to pain with noted posterior lean and fatigue, pt receptive to instruction. Pt agreeable to sit up in recliner >1 hour with chair alarm on for safety. Plan to progress gait distance with chair follow for safety next session. Pt continues to benefit from PT services to progress toward functional mobility goals.    Recommendations for follow up therapy are one component of a multi-disciplinary discharge planning process, led by the attending physician.  Recommendations may be updated based on patient status, additional functional criteria and insurance authorization.  Follow Up Recommendations  Acute inpatient rehab (3hours/day)     Assistance Recommended at Discharge Frequent or constant Supervision/Assistance  Patient can return home with the following A lot of help with walking and/or transfers;A lot of help with bathing/dressing/bathroom;Assistance with cooking/housework;Direct supervision/assist for medications management;Direct supervision/assist for financial management;Assist for transportation;Help with stairs or ramp for entrance   Equipment Recommendations  None recommended by PT    Recommendations for Other Services Rehab consult     Precautions  / Restrictions Precautions Precautions: Fall Restrictions Weight Bearing Restrictions: No     Mobility  Bed Mobility Overal bed mobility: Needs Assistance Bed Mobility: Rolling, Sidelying to Sit Rolling: Min guard Sidelying to sit: Min assist       General bed mobility comments: cues for hand placement, but able to initiate at trunk with increased strength in session    Transfers Overall transfer level: Needs assistance Equipment used: Rolling walker (2 wheels) Transfers: Sit to/from Stand, Bed to chair/wheelchair/BSC Sit to Stand: Min assist, +2 safety/equipment, +2 physical assistance   Step pivot transfers: Mod assist, +2 physical assistance       General transfer comment: patient able to complete sit<>stand x2 in session, however continues to demonstrate posterior lean with movement requiring tactile cues to maintain balance to complete transfer with RW and +2 external assist at trunk/hips to prevent increased posterior lean    Ambulation/Gait Ambulation/Gait assistance: Mod assist, +2 physical assistance Gait Distance (Feet): 5 Feet Assistive device: Rolling walker (2 wheels) Gait Pattern/deviations: Leaning posteriorly, Trunk flexed, Narrow base of support, Decreased dorsiflexion - right, Decreased dorsiflexion - left, Shuffle       General Gait Details: distance limited due to pt c/o fatigue after stepping from bed>chair.       Balance Overall balance assessment: Needs assistance Sitting-balance support: Feet supported, Single extremity supported Sitting balance-Leahy Scale: Fair     Standing balance support: Bilateral upper extremity supported, During functional activity, Reliant on assistive device for balance Standing balance-Leahy Scale: Poor Standing balance comment: reliant on RW                            Cognition Arousal/Alertness: Awake/alert Behavior During Therapy: Flat affect Overall Cognitive Status: Within Functional  Limits  for tasks assessed            Exercises Other Exercises Other Exercises: seated BLE A/AAROM: ankle pumps, LAQ (AA), hip flexion x5-10 reps ea    General Comments General comments (skin integrity, edema, etc.): SpO2 92% and greater on 2L [place]; HR 90's bpm      Pertinent Vitals/Pain Pain Assessment Pain Assessment: Faces Faces Pain Scale: Hurts little more Pain Location: abdomen (more to R side) Pain Descriptors / Indicators: Grimacing, Guarding Pain Intervention(s): Monitored during session, Repositioned, Premedicated before session     PT Goals (current goals can now be found in the care plan section) Acute Rehab PT Goals Patient Stated Goal: to get stronger and pain reduced PT Goal Formulation: With patient/family Time For Goal Achievement: 08/17/22 Potential to Achieve Goals: Good Progress towards PT goals: Progressing toward goals    Frequency    Min 3X/week      PT Plan Current plan remains appropriate    Co-evaluation PT/OT/SLP Co-Evaluation/Treatment: Yes Reason for Co-Treatment: For patient/therapist safety;To address functional/ADL transfers PT goals addressed during session: Mobility/safety with mobility;Proper use of DME;Balance;Strengthening/ROM        AM-PAC PT "6 Clicks" Mobility   Outcome Measure  Help needed turning from your back to your side while in a flat bed without using bedrails?: A Little Help needed moving from lying on your back to sitting on the side of a flat bed without using bedrails?: A Lot (mod cues) Help needed moving to and from a bed to a chair (including a wheelchair)?: A Lot Help needed standing up from a chair using your arms (e.g., wheelchair or bedside chair)?: A Lot (mod cues) Help needed to walk in hospital room?: Total Help needed climbing 3-5 steps with a railing? : Total 6 Click Score: 11    End of Session Equipment Utilized During Treatment: Gait belt Activity Tolerance: Patient tolerated treatment well;Patient  limited by pain Patient left: in chair;with call bell/phone within reach;with chair alarm set Nurse Communication: Mobility status;Need for lift equipment (+2 with Stedy or RW to pivot back) PT Visit Diagnosis: Muscle weakness (generalized) (M62.81);Difficulty in walking, not elsewhere classified (R26.2);Other abnormalities of gait and mobility (R26.89);Pain Pain - Right/Left: Right     Time: 1200-1227 PT Time Calculation (min) (ACUTE ONLY): 27 min  Charges:  $Therapeutic Activity: 8-22 mins                     Douglas Hurst P., PTA Acute Rehabilitation Services Secure Chat Preferred 9a-5:30pm Office: [place] 08/05/2022, 3:37 PM

## 2022-08-05 NOTE — Progress Notes (Signed)
Subjective: Pain improving.  Objective: Vital signs in last 24 hours: Temp:  [97.9 F (36.6 C)-98.2 F (36.8 C)] 98 F (36.7 C) (08/09 1255) Pulse Rate:  [70-94] 83 (08/09 1255) Resp:  [14-21] 15 (08/09 1255) BP: (120-154)/(68-86) 141/84 (08/09 1255) SpO2:  [91 %-95 %] 94 % (08/09 1255) Weight:  [88 kg] 88 kg (08/09 0500) Weight change: 0.7 kg Last BM Date : 08/01/22  PE: GEN:  NAD ABD:  Soft, mild (but improving) tenderness, no peritonitis  Lab Results:  CMP     Component Value Date/Time   NA 139 08/03/2022 0819   NA 139 06/15/2022 1006   K 3.4 (L) 08/03/2022 0819   CL 105 08/03/2022 0819   CO2 26 08/03/2022 0819   GLUCOSE 95 08/03/2022 0819   BUN 17 08/03/2022 0819   BUN 12 06/15/2022 1006   CREATININE 0.79 08/03/2022 0819   CALCIUM 8.3 (L) 08/03/2022 0819   PROT 5.0 (L) 08/03/2022 0819   PROT 5.8 (L) 06/15/2022 1006   ALBUMIN 2.0 (L) 08/03/2022 0819   ALBUMIN 3.7 06/15/2022 1006   AST 18 08/03/2022 0819   ALT 17 08/03/2022 0819   ALKPHOS 64 08/03/2022 0819   BILITOT 1.0 08/03/2022 0819   BILITOT 0.5 06/15/2022 1006   GFRNONAA >60 08/03/2022 0819   GFRAA >60 10/02/2015 1956   CBC    Component Value Date/Time   WBC 6.9 08/05/2022 0525   RBC 3.82 (L) 08/05/2022 0525   HGB 11.5 (L) 08/05/2022 0525   HGB 13.5 06/15/2022 1006   HCT 34.0 (L) 08/05/2022 0525   HCT 41.3 06/15/2022 1006   PLT 134 (L) 08/05/2022 0525   PLT 219 06/15/2022 1006   MCV 89.0 08/05/2022 0525   MCV 92 06/15/2022 1006   MCH 30.1 08/05/2022 0525   MCHC 33.8 08/05/2022 0525   RDW 16.0 (H) 08/05/2022 0525   RDW 13.6 06/15/2022 1006   LYMPHSABS 0.5 (L) 08/01/2022 0717   MONOABS 0.3 08/01/2022 0717   EOSABS 0.0 08/01/2022 0717   BASOSABS 0.0 08/01/2022 0717   Assessment:   Post-ERCP pancreatitis.  Plan:   Continue antibiotics, maybe transition to oral in 1-2 days. Advance diet. Transition analgesics to oral as tolerated. PT evaluation in progress; may need short term  rehab? [facility] will follow.   Douglas Hurst 08/05/2022, 2:18 PM   Cell [phone] If no answer or after 5 PM call [phone]

## 2022-08-05 NOTE — Patient Outreach (Signed)
[facility]) Care Management  08/05/2022  Douglas Hurst. 09-Sep-1941 [id number]   Received referral from [facility] as patient high risk readmission. Patient noted to be admitted currently to hospital on 07/30/22 from [facility].  Currently recommendations for CIR.    Plan: RN CM will close case at this time.    Jone Baseman, RN, MSN [facility] Care Management Care Management Coordinator Direct Line [phone] Toll Free: [phone]  Fax: [phone]

## 2022-08-05 NOTE — Progress Notes (Addendum)
PROGRESS NOTE        PATIENT DETAILS Name: Douglas Hurst. Age: 81 y.o. Sex: male Date of Birth: 10/27/1941 Admit Date: 07/30/2022 Admitting Physician Clance Boll, MD AJO:INOMVEH, [name], MD  Brief Summary: Patient is a 81 y.o.  male with history of PAF on Eliquis, HTN, HLD, COPD, chronic HFrEF (recovered EF)-with recent history of cholecystitis-s/p cholecystectomy but with retained CBD stone.  ERCP here was unsuccessful.  Patient was referred to [facility] underwent advanced endoscopy via transduodenal approach for ERCP on 8/3 at [facility] with Dr. Okey Dupre -he subsequently presented to the ED with severe abdominal pain-was found to have pancreatitis and admitted to the hospitalist service.  Significant events: 8/3>> admit to [facility]-abdominal pain after ERCP at [facility]-found to have acute pancreatitis 8/5>> A-fib RVR-no response to IV Lopressor-blood pressure soft-amiodarone infusion started.  After discussion with GI-heparin started without bolus. 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 8/5>>Repeat urgent CT-AP overnight due to worsening pain without over changes  Consults: GI Phone consult-cards-Dr Crenshaw  Subjective: No acute issues or events overnight, minimally worsening abdominal pain epigastric overnight, pelvic pain with previous urinary obstruction appears to be resolved at this point  Objective: Vitals: Blood pressure 131/73, pulse 92, temperature 98.2 F (36.8 C), temperature source Oral, resp. rate 20, height '6\' 1"'$  (1.854 m), weight 88 kg, SpO2 91 %.   Exam: Gen Exam:Alert awake-not in any distress HEENT:atraumatic, normocephalic Chest: B/L clear to auscultation  anteriorly CVS:S1S2 irregular Abdomen:soft -tender in the upper abdomen without any peritoneal signs. Extremities:no edema Neurology: Non focal Skin: no rash  Pertinent Labs/Radiology:    Latest Ref Rng & Units 08/05/2022    5:25 AM 08/04/2022    5:09 AM 08/03/2022    8:19 AM  CBC  WBC 4.0 - 10.5 K/uL 6.9  7.7  10.4   Hemoglobin 13.0 - 17.0 g/dL 11.5  12.7  12.3   Hematocrit 39.0 - 52.0 % 34.0  38.1  36.9   Platelets 150 - 400 K/uL 134  160  161     Lab Results  Component Value Date   NA 139 08/03/2022   K 3.4 (L) 08/03/2022   CL 105 08/03/2022   CO2 26 08/03/2022      Assessment/Plan:  Acute pancreatitis (possibly necrotizing)-duodenitis with contained duodenal perforation (transduodenal approach to puncture extrahepatic bile duct to place guidewire into duodenum-subsequently ERCP scope inserted and stone removed):  Continues to have epigastric pain-transition to zosyn from meropenem Per GI-okay to advance to clear liquids.  Repeat CT shows stable disease  PAF with RVR:  Provoked by pancreatitis/pain/acute illness-given 1 dose of IV Lopressor-minimal change in heart rate-but now blood pressure dropping/soft-discussed with cardiology-Dr. Stanford Breed over the phone-we will start amiodarone infusion (on oral amiodarone at home).  After discussion with GI-start heparin infusion without bolus (Eliquis held since 7/31 for ERCP).  Continue to monitor closely on telemetry.  When blood pressure more acceptable-we will start oral metoprolol.  Ambulatory dysfunction Multifactorial - follow PT/OT recs  AKI, resolved Complicated by BPH and lower urinary tract obstruction  Resolved-likely hemodynamically mediated. Resume flomax - symptoms improving somewhat already; not back to baseline  Chronic HFrEF with recovered  EF: Some mild shortness of breath-but also anxious-no obvious signs of volume overload-monitor closely while on IVF for pancreatitis.    DM-2: CBGs stable with SSI.  Resume  metformin when closer to discharge.  HTN: BP soft-continue to hold all antihypertensives.  COPD: Continue bronchodilators  Infrarenal abdominal aortic aneurysm 4 mm: Unchanged from prior study-annual follow-up is required per protocol  BMI: Estimated body mass index is 25.6 kg/m as calculated from the following:   Height as of this encounter: '6\' 1"'$  (1.854 m).   Weight as of this encounter: 88 kg.   Code status:   Code Status: Full Code   DVT Prophylaxis:IV heparin    Family Communication: Spousee at bedside   Disposition Plan: Status is: Inpatient Remains inpatient appropriate because: Pancreatitis not yet stable for discharge, requires ongoing evaluation with GI as well as given ongoing ambulatory dysfunction per PT   Planned Discharge Destination:Home   Diet: Diet Order             Diet clear liquid Room service appropriate? Yes; Fluid consistency: Thin  Diet effective now                     Antimicrobial agents: Anti-infectives (From admission, onward)    Start     Dose/Rate Route Frequency Ordered Stop   08/02/22 1600  piperacillin-tazobactam (ZOSYN) IVPB 3.375 g        3.375 g 12.5 mL/hr over 240 Minutes Intravenous Every 8 hours 08/02/22 1039     08/02/22 1130  piperacillin-tazobactam (ZOSYN) IVPB 4.5 g        4.5 g 200 mL/hr over 30 Minutes Intravenous  Once 08/02/22 1039 08/02/22 1240   07/31/22 0400  meropenem (MERREM) 1 g in sodium chloride 0.9 % 100 mL IVPB  Status:  Discontinued        1 g 200 mL/hr over 30 Minutes Intravenous Every 8 hours 07/31/22 0252 08/02/22 1039   07/30/22 2245  piperacillin-tazobactam (ZOSYN) IVPB 3.375 g  Status:  Discontinued        3.375 g 12.5 mL/hr over 240 Minutes Intravenous Every 8 hours 07/30/22 2239 07/31/22 0239        MEDICATIONS: Scheduled Meds:  amiodarone  200 mg Oral Daily   insulin aspart  0-6 Units Subcutaneous Q4H   tamsulosin  0.4 mg Oral QPC supper   Continuous Infusions:  heparin 1,050  Units/hr (08/05/22 0139)   piperacillin-tazobactam (ZOSYN)  IV 3.375 g (08/04/22 2337)   PRN Meds:.acetaminophen **OR** acetaminophen, HYDROmorphone (DILAUDID) injection, ondansetron **OR** ondansetron (ZOFRAN) IV, umeclidinium bromide   I have personally reviewed following labs and imaging studies  LABORATORY DATA: CBC: Recent Labs  Lab 07/30/22 2048 07/31/22 0257 08/01/22 0717 08/02/22 0703 08/03/22 0819 08/04/22 0509 08/05/22 0525  WBC 9.1   < > 12.7* 15.3* 10.4 7.7 6.9  NEUTROABS 8.2*  --  11.6*  --   --   --   --   HGB 13.7   < > 12.8* 12.7* 12.3* 12.7* 11.5*  HCT 43.7   < > 38.3* 38.1* 36.9* 38.1* 34.0*  MCV 95.4   < > 90.8 90.1 88.7 90.1 89.0  PLT 298   < > 215 221 161 160 134*   < > = values in this interval not displayed.     Basic Metabolic Panel: Recent Labs  Lab 07/30/22 2048 07/31/22 0257 08/01/22 0717 08/02/22 0703 08/03/22 0819  NA 138 136 139 136 139  K 4.1 3.8 3.9 3.2* 3.4*  CL  99 102 102 101 105  CO2 22 20* '26 23 26  '$ GLUCOSE 203* 188* 110* 109* 95  BUN 23 23 28* 24* 17  CREATININE 1.30* 1.16 1.09 0.98 0.79  CALCIUM 9.3 8.8* 8.4* 8.3* 8.3*  MG  --   --   --   --  1.7     GFR: Estimated Creatinine Clearance: 84.6 mL/min (by C-G formula based on SCr of 0.79 mg/dL).  Liver Function Tests: Recent Labs  Lab 07/30/22 2048 07/31/22 0257 08/01/22 0717 08/02/22 0703 08/03/22 0819  AST '26 24 22 19 18  '$ ALT '26 23 23 20 17  '$ ALKPHOS 68 59 53 72 64  BILITOT 1.3* 0.9 1.1 1.0 1.0  PROT 6.3* 5.5* 5.2* 5.5* 5.0*  ALBUMIN 3.3* 2.8* 2.4* 2.3* 2.0*    Recent Labs  Lab 07/30/22 2048  LIPASE 21    No results for input(s): "AMMONIA" in the last 168 hours.  Coagulation Profile: No results for input(s): "INR", "PROTIME" in the last 168 hours.  Cardiac Enzymes: No results for input(s): "CKTOTAL", "CKMB", "CKMBINDEX", "TROPONINI" in the last 168 hours.  BNP (last 3 results) No results for input(s): "PROBNP" in the last 8760 hours.  Lipid  Profile: No results for input(s): "CHOL", "HDL", "LDLCALC", "TRIG", "CHOLHDL", "LDLDIRECT" in the last 72 hours.  Thyroid Function Tests: No results for input(s): "TSH", "T4TOTAL", "FREET4", "T3FREE", "THYROIDAB" in the last 72 hours.  Anemia Panel: No results for input(s): "VITAMINB12", "FOLATE", "FERRITIN", "TIBC", "IRON", "RETICCTPCT" in the last 72 hours.  Urine analysis:    Component Value Date/Time   COLORURINE YELLOW 04/25/2022 1232   APPEARANCEUR CLEAR 04/25/2022 1232   LABSPEC <1.005 (L) 04/25/2022 1232   PHURINE 6.0 04/25/2022 1232   GLUCOSEU 100 (A) 04/25/2022 1232   HGBUR TRACE (A) 04/25/2022 1232   BILIRUBINUR NEGATIVE 04/25/2022 1232   KETONESUR NEGATIVE 04/25/2022 1232   PROTEINUR NEGATIVE 04/25/2022 1232   UROBILINOGEN 4.0 (H) 10/02/2015 2135   NITRITE POSITIVE (A) 04/25/2022 1232   LEUKOCYTESUR SMALL (A) 04/25/2022 1232    Sepsis Labs: Lactic Acid, Venous    Component Value Date/Time   LATICACIDVEN 6.2 (HH) 07/31/2022 0528    MICROBIOLOGY: No results found for this or any previous visit (from the past 240 hour(s)).  RADIOLOGY STUDIES/RESULTS: No results found.   LOS: 5 days   Little Ishikawa, DO  [facility]    To contact the attending provider between 7A-7P or the covering provider during after hours 7P-7A, please log into the web site [email or web address] and access using universal [place] password for that web site. If you do not have the password, please call the hospital operator.  08/05/2022, 7:54 AM

## 2022-08-05 NOTE — Progress Notes (Signed)
ANTICOAGULATION CONSULT NOTE - follow-up  Pharmacy Consult for heparin infusion Indication: atrial fibrillation  No Known Allergies  Patient Measurements: Height: '6\' 1"'$  (185.4 cm) Weight: 88 kg (194 lb 0.1 oz) IBW/kg (Calculated) : 79.9 Heparin Dosing Weight: 80.7  Vital Signs: Temp: 98.2 F (36.8 C) (08/09 0334) Temp Source: Oral (08/09 0334) BP: 131/73 (08/09 0334) Pulse Rate: 92 (08/09 0334)  Labs: Recent Labs    08/03/22 0819 08/04/22 0509 08/05/22 0525  HGB 12.3* 12.7* 11.5*  HCT 36.9* 38.1* 34.0*  PLT 161 160 134*  HEPARINUNFRC 0.51 0.36 0.31  CREATININE 0.79  --   --      Estimated Creatinine Clearance: 84.6 mL/min (by C-G formula based on SCr of 0.79 mg/dL).    Medications:  Infusions:   heparin 1,050 Units/hr (08/05/22 0139)   piperacillin-tazobactam (ZOSYN)  IV 3.375 g (08/04/22 2337)    Assessment: Douglas Hurst is a 81 y/o male whose pertinent medication history includes Afib and CHF. Pharmacy has been consulted to initiate a heparin infusion, no bolus per MD. His last dose of Eliquis was charted as 7/30.   Heparin level therapeutic this AM at 0.31, hgb 11.5, PLTC 134  Goal of Therapy:  Heparin level: 0.3-0.7 Monitor platelets by anticoagulation protocol: Yes    Plan:  Continue heparin infusion at 1050 units/hr Continue daily heparin levels with AM labs  Continue to monitor H&H and platelets via CBC  Mande Auvil A. Levada Dy, PharmD, BCPS, FNKF Clinical Pharmacist [place] Please utilize Amion for appropriate phone number to reach the unit pharmacist ([place])

## 2022-08-05 NOTE — Progress Notes (Signed)
Occupational Therapy Treatment Patient Details Name: Douglas Hurst. MRN: [id number] DOB: 1941-09-02 Today's Date: 08/05/2022   History of present illness Patient is a 81 yo male presenting to the ED with severe abdominal pain with radiation to right shoulder on 07/31/22. Patient had underwent ECRP for removal of stones from biliary and pancreatic duct on 8/3. Admitted with acute pancreatitis. PMH includes: bipolar d/o DMII, HTN, HLD, GERD, COPD ,  Afib ,diastolic heart failure, AAA with interim history of acute cholecystitis   OT comments  Patient continues to make steady progress towards goals in skilled OT session. Patient's session encompassed  co-treat with PTA to advance functional mobility through skilled intervention. Patient with improved affect in session and decreased pain, requiring min A for bed mobility, and min A +2 in order to stand and complete stand pivot transfers. Patient continues to demonstrate a posterior lean in standing and poor awareness of base of support with RW, requiring tactile cues to prevent LOB. OT continues to endorse AIR placement to promote increased functional independence. OT will continue to follow acutely.    Recommendations for follow up therapy are one component of a multi-disciplinary discharge planning process, led by the attending physician.  Recommendations may be updated based on patient status, additional functional criteria and insurance authorization.    Follow Up Recommendations  Acute inpatient rehab (3hours/day)    Assistance Recommended at Discharge    Patient can return home with the following  Two people to help with walking and/or transfers;A lot of help with bathing/dressing/bathroom;Assistance with cooking/housework;Direct supervision/assist for medications management;Assist for transportation;Help with stairs or ramp for entrance   Equipment Recommendations  Other (comment) (will continue to assess)    Recommendations for Other  Services      Precautions / Restrictions Precautions Precautions: Fall Restrictions Weight Bearing Restrictions: No       Mobility Bed Mobility Overal bed mobility: Needs Assistance Bed Mobility: Supine to Sit     Supine to sit: Min assist     General bed mobility comments: cues for hand placement, but able to initiate at trunk with increased strength in session    Transfers Overall transfer level: Needs assistance Equipment used: Rolling walker (2 wheels) Transfers: Sit to/from Stand Sit to Stand: Min assist, +2 safety/equipment, +2 physical assistance           General transfer comment: patient able to complete sit<>stand x2 in session, however continues to demonstrate posterior lean with movement requiring tactile cues to maintain balance to complete transfer     Balance Overall balance assessment: Needs assistance Sitting-balance support: Feet supported Sitting balance-Leahy Scale: Fair     Standing balance support: Bilateral upper extremity supported, During functional activity, Reliant on assistive device for balance Standing balance-Leahy Scale: Poor Standing balance comment: reliant on RW                           ADL either performed or assessed with clinical judgement   ADL Overall ADL's : Needs assistance/impaired     Grooming: Wash/dry hands;Wash/dry face;Sitting;Set up                   Toilet Transfer: Minimal assistance;Cueing for safety;Cueing for sequencing;Rolling walker (2 wheels);+2 for safety/equipment;+2 for physical assistance Toilet Transfer Details (indicate cue type and reason): increased time and tactile cues to prevent posterior lean         Functional mobility during ADLs: Cueing for safety;Cueing for sequencing;Rolling walker (2  wheels);Minimal assistance General ADL Comments: Patient with improved pain and participation in session to date    Extremity/Trunk Assessment              Vision        Perception     Praxis      Cognition Arousal/Alertness: Awake/alert Behavior During Therapy: Flat affect Overall Cognitive Status: Within Functional Limits for tasks assessed                                          Exercises      Shoulder Instructions       General Comments      Pertinent Vitals/ Pain       Pain Assessment Pain Assessment: Faces Faces Pain Scale: Hurts a little bit Pain Location: stomach Pain Descriptors / Indicators: Grimacing, Guarding Pain Intervention(s): Limited activity within patient's tolerance, Monitored during session, Repositioned, Premedicated before session  Home Living                                          Prior Functioning/Environment              Frequency  Min 3X/week        Progress Toward Goals  OT Goals(current goals can now be found in the care plan section)  Progress towards OT goals: Progressing toward goals  Acute Rehab OT Goals Patient Stated Goal: to get stronger OT Goal Formulation: With patient Time For Goal Achievement: 08/17/22 Potential to Achieve Goals: Good  Plan Discharge plan remains appropriate    Co-evaluation                 AM-PAC OT "6 Clicks" Daily Activity     Outcome Measure   Help from another person eating meals?: None Help from another person taking care of personal grooming?: A Little Help from another person toileting, which includes using toliet, bedpan, or urinal?: A Lot Help from another person bathing (including washing, rinsing, drying)?: A Lot Help from another person to put on and taking off regular upper body clothing?: A Little Help from another person to put on and taking off regular lower body clothing?: A Lot 6 Click Score: 16    End of Session Equipment Utilized During Treatment: Gait belt;Rolling walker (2 wheels)  OT Visit Diagnosis: Unsteadiness on feet (R26.81);Muscle weakness (generalized) (M62.81)   Activity  Tolerance Patient tolerated treatment well   Patient Left in chair;with call bell/phone within reach;with chair alarm set   Nurse Communication Mobility status        Time: 0623-7628 OT Time Calculation (min): 26 min  Charges: OT General Charges $OT Visit: 1 Visit OT Treatments $Self Care/Home Management : 8-22 mins  Corinne Ports E. Mykal Batiz, OTR/L Acute Rehabilitation Services [phone]   Ascencion Dike 08/05/2022, 2:27 PM

## 2022-08-06 DIAGNOSIS — R1084 Generalized abdominal pain: Secondary | ICD-10-CM | POA: Diagnosis not present

## 2022-08-06 DIAGNOSIS — K859 Acute pancreatitis without necrosis or infection, unspecified: Secondary | ICD-10-CM | POA: Diagnosis not present

## 2022-08-06 DIAGNOSIS — R932 Abnormal findings on diagnostic imaging of liver and biliary tract: Secondary | ICD-10-CM | POA: Diagnosis not present

## 2022-08-06 DIAGNOSIS — R1011 Right upper quadrant pain: Secondary | ICD-10-CM | POA: Diagnosis not present

## 2022-08-06 DIAGNOSIS — R1013 Epigastric pain: Secondary | ICD-10-CM | POA: Diagnosis not present

## 2022-08-06 LAB — CBC
HCT: 32.6 % — ABNORMAL LOW (ref 39.0–52.0)
Hemoglobin: 11.2 g/dL — ABNORMAL LOW (ref 13.0–17.0)
MCH: 30.2 pg (ref 26.0–34.0)
MCHC: 34.4 g/dL (ref 30.0–36.0)
MCV: 87.9 fL (ref 80.0–100.0)
Platelets: 138 10*3/uL — ABNORMAL LOW (ref 150–400)
RBC: 3.71 MIL/uL — ABNORMAL LOW (ref 4.22–5.81)
RDW: 15.9 % — ABNORMAL HIGH (ref 11.5–15.5)
WBC: 8.1 10*3/uL (ref 4.0–10.5)
nRBC: 0 % (ref 0.0–0.2)

## 2022-08-06 LAB — GLUCOSE, CAPILLARY
Glucose-Capillary: 115 mg/dL — ABNORMAL HIGH (ref 70–99)
Glucose-Capillary: 94 mg/dL (ref 70–99)
Glucose-Capillary: 120 mg/dL — ABNORMAL HIGH (ref 70–99)
Glucose-Capillary: 157 mg/dL — ABNORMAL HIGH (ref 70–99)
Glucose-Capillary: 135 mg/dL — ABNORMAL HIGH (ref 70–99)

## 2022-08-06 LAB — HEPARIN LEVEL (UNFRACTIONATED)
Heparin Unfractionated: 0.23 IU/mL — ABNORMAL LOW (ref 0.30–0.70)
Heparin Unfractionated: 0.35 IU/mL (ref 0.30–0.70)

## 2022-08-06 MED ORDER — BISACODYL 10 MG RE SUPP
10.0000 mg | Freq: Every day | RECTAL | Status: AC
Start: 2022-08-06 — End: 2022-08-07
  Administered 2022-08-06 – 2022-08-07 (×2): 10 mg via RECTAL
  Filled 2022-08-06 (×2): qty 1

## 2022-08-06 MED ORDER — POLYETHYLENE GLYCOL 3350 17 G PO PACK
17.0000 g | PACK | Freq: Three times a day (TID) | ORAL | Status: DC
  Administered 2022-08-06 – 2022-08-10 (×5): 17 g via ORAL
  Filled 2022-08-06 (×9): qty 1

## 2022-08-06 MED ORDER — INSULIN ASPART 100 UNIT/ML IJ SOLN
0.0000 [IU] | Freq: Three times a day (TID) | INTRAMUSCULAR | Status: DC
  Administered 2022-08-11 – 2022-08-14 (×4): 1 [IU] via SUBCUTANEOUS
  Administered 2022-08-14 (×2): 2 [IU] via SUBCUTANEOUS
  Administered 2022-08-15 (×2): 1 [IU] via SUBCUTANEOUS
  Administered 2022-08-15 – 2022-08-16 (×3): 2 [IU] via SUBCUTANEOUS

## 2022-08-06 NOTE — NC FL2 (Signed)
[place] LEVEL OF CARE SCREENING TOOL     IDENTIFICATION  Patient Name: Douglas Hurst. Birthdate: 29-Jun-1941 Sex: male Admission Date (Current Location): 07/30/2022  [facility] and [place] Number:  Herbalist and Address:  [facility], [place] [address], [place], [place] [address]      Provider Number: [id number]  Attending Physician Name and Address:  Little Ishikawa, MD  Relative Name and Phone Number:       Current Level of Care: Hospital Recommended Level of Care: [place] Prior Approval Number:    Date Approved/Denied:   PASRR Number: [id number] E expires 08/12/22  Discharge Plan: SNF    Current Diagnoses: Patient Active Problem List   Diagnosis Date Noted   Acute pancreatitis 07/31/2022   Choledocholithiasis 07/08/2022   Hyperbilirubinemia 04/30/2022   Hypoxia 04/29/2022   Normocytic anemia 35/32/9924   Acute systolic heart failure (HCC)    PAF (paroxysmal atrial fibrillation) ([place])    Acute calculous cholecystitis 04/26/2022   Abdominal aortic aneurysm ([place]) 04/25/2022   Bipolar disorder ([place]) 04/25/2022   Chronic obstructive pulmonary disease ([place]) 04/25/2022   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[place]) 04/25/2022   Gastroesophageal reflux disease 04/25/2022   Essential hypertension 04/25/2022   Mixed hyperlipidemia 04/25/2022   sepsis secondary to calculus of bile duct (any) with acute cholecystitis 04/25/2022   Sepsis ([place]) 04/25/2022    Orientation RESPIRATION BLADDER Height & Weight     Self, Time, Situation, Place  O2 (2L nasal cannula) Continent, External catheter Weight: 195 lb 5.2 oz (88.6 kg) Height:  '6\' 1"'$  (185.4 cm)  BEHAVIORAL SYMPTOMS/MOOD NEUROLOGICAL BOWEL NUTRITION STATUS      Continent Diet (See dc summary)  AMBULATORY STATUS COMMUNICATION OF NEEDS Skin   Limited Assist Verbally Surgical wounds (Closed incision on abdomen)                        Personal Care Assistance Level of Assistance  Bathing, Feeding, Dressing Bathing Assistance: Limited assistance Feeding assistance: Independent Dressing Assistance: Limited assistance     Functional Limitations Info  Sight Sight Info: Impaired        SPECIAL CARE FACTORS FREQUENCY  PT (By licensed PT), OT (By licensed OT)     PT Frequency: 5x/week OT Frequency: 5x/week            Contractures Contractures Info: Not present    Additional Factors Info  Code Status, Allergies, Insulin Sliding Scale Code Status Info: Full Allergies Info: NKA   Insulin Sliding Scale Info: See dc summary       Current Medications (08/06/2022):  This is the current hospital active medication list Current Facility-Administered Medications  Medication Dose Route Frequency Provider Last Rate Last Admin   acetaminophen (TYLENOL) tablet 650 mg  650 mg Oral Q6H PRN Clance Boll, MD       Or   acetaminophen (TYLENOL) suppository 650 mg  650 mg Rectal Q6H PRN Clance Boll, MD       amiodarone (PACERONE) tablet 200 mg  200 mg Oral Daily Little Ishikawa, MD   200 mg at 08/06/22 0941   heparin ADULT infusion 100 units/mL (25000 units/253m)  1,150 Units/hr Intravenous Continuous LErenest Blank RPH 11.5 mL/hr at 08/06/22 0615 1,150 Units/hr at 08/06/22 0615   HYDROcodone-acetaminophen (NORCO/VICODIN) 5-325 MG per tablet 1 tablet  1 tablet Oral Q6H PRN LLittle Ishikawa MD   1 tablet  at 08/05/22 1647   HYDROmorphone (DILAUDID) injection 0.5 mg  0.5 mg Intravenous Q4H PRN Little Ishikawa, MD   0.5 mg at 08/06/22 0949   insulin aspart (novoLOG) injection 0-6 Units  0-6 Units Subcutaneous Q4H Myles Rosenthal A, MD   1 Units at 08/05/22 2029   ondansetron (ZOFRAN) tablet 4 mg  4 mg Oral Q6H PRN Clance Boll, MD       Or   ondansetron [facility]) injection 4 mg  4 mg Intravenous Q6H PRN Clance Boll, MD       piperacillin-tazobactam (ZOSYN) IVPB  3.375 g  3.375 g Intravenous Q8H Holli Humbles C, MD 12.5 mL/hr at 08/06/22 0935 3.375 g at 08/06/22 0935   polyethylene glycol (MIRALAX / GLYCOLAX) packet 17 g  17 g Oral TID Little Ishikawa, MD   17 g at 08/06/22 4627   senna-docusate (Senokot-S) tablet 1 tablet  1 tablet Oral BID Little Ishikawa, MD   1 tablet at 08/06/22 0935   tamsulosin (FLOMAX) capsule 0.8 mg  0.8 mg Oral Daily Little Ishikawa, MD   0.8 mg at 08/06/22 0935   umeclidinium bromide (INCRUSE ELLIPTA) 62.5 MCG/ACT 1 puff  1 puff Inhalation Daily PRN Theotis Burrow, [facility]         Discharge Medications: Please see discharge summary for a list of discharge medications.  Relevant Imaging Results:  Relevant Lab Results:   Additional Information SSN: [id number]  [place], LCSW

## 2022-08-06 NOTE — Progress Notes (Signed)
Subjective: Worsening abdominal pain. No bowel movement 4 days (routine every 1-2 days).  Objective: Vital signs in last 24 hours: Temp:  [98.2 F (36.8 C)-98.7 F (37.1 C)] 98.7 F (37.1 C) (08/10 0932) Pulse Rate:  [85-88] 88 (08/10 0932) Resp:  [11-21] 21 (08/10 0932) BP: (144-157)/(74) 157/74 (08/10 0932) SpO2:  [93 %-98 %] 94 % (08/10 0932) Weight:  [88.6 kg] 88.6 kg (08/10 0500) Weight change: 0.6 kg Last BM Date : 08/01/22  PE: GEN:  NAD, less comfortable-appearing but non-toxic ABD:  Soft, mild/moderate epigastric tenderness with some voluntary guarding but no peritonitis  Lab Results: CBC    Component Value Date/Time   WBC 8.1 08/06/2022 0404   RBC 3.71 (L) 08/06/2022 0404   HGB 11.2 (L) 08/06/2022 0404   HGB 13.5 06/15/2022 1006   HCT 32.6 (L) 08/06/2022 0404   HCT 41.3 06/15/2022 1006   PLT 138 (L) 08/06/2022 0404   PLT 219 06/15/2022 1006   MCV 87.9 08/06/2022 0404   MCV 92 06/15/2022 1006   MCH 30.2 08/06/2022 0404   MCHC 34.4 08/06/2022 0404   RDW 15.9 (H) 08/06/2022 0404   RDW 13.6 06/15/2022 1006   LYMPHSABS 0.5 (L) 08/01/2022 0717   MONOABS 0.3 08/01/2022 0717   EOSABS 0.0 08/01/2022 0717   BASOSABS 0.0 08/01/2022 0717  CMP     Component Value Date/Time   NA 139 08/03/2022 0819   NA 139 06/15/2022 1006   K 3.4 (L) 08/03/2022 0819   CL 105 08/03/2022 0819   CO2 26 08/03/2022 0819   GLUCOSE 95 08/03/2022 0819   BUN 17 08/03/2022 0819   BUN 12 06/15/2022 1006   CREATININE 0.79 08/03/2022 0819   CALCIUM 8.3 (L) 08/03/2022 0819   PROT 5.0 (L) 08/03/2022 0819   PROT 5.8 (L) 06/15/2022 1006   ALBUMIN 2.0 (L) 08/03/2022 0819   ALBUMIN 3.7 06/15/2022 1006   AST 18 08/03/2022 0819   ALT 17 08/03/2022 0819   ALKPHOS 64 08/03/2022 0819   BILITOT 1.0 08/03/2022 0819   BILITOT 0.5 06/15/2022 1006   GFRNONAA >60 08/03/2022 0819   GFRAA >60 10/02/2015 1956   Assessment:   Post-ERCP pancreatitis. Constipation. Abdominal pain.   Unclear  constipation versus pancreatitis versus both.  Plan:   Increase Miralax. Dulcolax suppositories. De-escalate diet. Reassess tomorrow; if pain not improving, may have to consider another repeat CT scan. [facility] will follow.   Douglas Hurst 08/06/2022, 1:01 PM   Cell [phone] If no answer or after 5 PM call [phone]

## 2022-08-06 NOTE — Progress Notes (Signed)
Inpatient Rehab Admissions Coordinator:   Note backing down on diet due to constipation and abdominal pain.  Possibility of repeat CT if no improvement tomorrow.  Will continue to follow.   Shann Medal, PT, DPT Admissions Coordinator [phone] 08/06/22  4:08 PM

## 2022-08-06 NOTE — Progress Notes (Signed)
PROGRESS NOTE        PATIENT DETAILS Name: Douglas Hurst. Age: 81 y.o. Sex: male Date of Birth: 02-Jun-1941 Admit Date: 07/30/2022 Admitting Physician Clance Boll, MD BLT:[id number], [name], MD  Brief Summary: Patient is a 81 y.o.  male with history of PAF on Eliquis, HTN, HLD, COPD, chronic HFrEF (recovered EF)-with recent history of cholecystitis-s/p cholecystectomy but with retained CBD stone.  ERCP here was unsuccessful.  Patient was referred to [facility] underwent advanced endoscopy via transduodenal approach for ERCP on 8/3 at [facility] with Dr. Okey Dupre -he subsequently presented to the ED with severe abdominal pain-was found to have pancreatitis and admitted to the hospitalist service.  Significant events: 8/3>> admit to [facility]-abdominal pain after ERCP at [facility]-found to have acute pancreatitis 8/5>> A-fib RVR-no response to IV Lopressor-blood pressure soft-amiodarone infusion started.  After discussion with GI-heparin started without bolus. 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 8/5>>Repeat urgent CT-AP overnight due to worsening pain without over changes  Consults: GI Phone consult-cards-Dr Crenshaw  Subjective: No acute issues or events overnight, abdominal pain resolving - no further urinary symptoms - continues to complain of generalized weakness but looking forward to ongoing PT  Objective: Vitals: Blood pressure (!) 144/74, pulse 87, temperature 98.3 F (36.8 C), temperature source Oral, resp. rate 11, height '6\' 1"'$  (1.854 m), weight 88.6 kg, SpO2 93 %.   Exam: Gen Exam:Alert awake-not in any distress HEENT:atraumatic, normocephalic Chest: B/L clear to auscultation  anteriorly CVS:S1S2 irregular Abdomen:soft -tender in the upper abdomen without any peritoneal signs. Extremities:no edema Neurology: Non focal Skin: no rash  Pertinent Labs/Radiology:    Latest Ref Rng & Units 08/06/2022    4:04 AM 08/05/2022    5:25 AM 08/04/2022    5:09 AM  CBC  WBC 4.0 - 10.5 K/uL 8.1  6.9  7.7   Hemoglobin 13.0 - 17.0 g/dL 11.2  11.5  12.7   Hematocrit 39.0 - 52.0 % 32.6  34.0  38.1   Platelets 150 - 400 K/uL 138  134  160     Lab Results  Component Value Date   NA 139 08/03/2022   K 3.4 (L) 08/03/2022   CL 105 08/03/2022   CO2 26 08/03/2022      Assessment/Plan:  Acute pancreatitis (possibly necrotizing)-duodenitis with contained duodenal perforation (transduodenal approach to puncture extrahepatic bile duct to place guidewire into duodenum-subsequently ERCP scope inserted and stone removed):  Epigastric pain resolving - advancing diet slowly Appreciate GI insight/recs Continue zosyn per GI recs Repeat CT shows stable disease  PAF with RVR:  Provoked by pancreatitis/pain/acute illness- - Controlled on PO amiodarone (off gtt) -Heparin ongoing - transition to eliquis at DC -Consider metoprolol but currently well controlled  Ambulatory dysfunction Multifactorial - follow PT/OT recs  AKI, resolved Complicated by BPH and lower urinary tract obstruction  Resolved-likely hemodynamically mediated. Resume flomax - symptoms improving somewhat already; not back to baseline  Chronic HFrEF with recovered EF: Some mild shortness of breath-but also anxious-no obvious signs of volume overload-monitor closely while on IVF for pancreatitis.    DM-2: CBGs stable with SSI.  Resume metformin when closer to discharge.  HTN: BP soft-continue  to hold all antihypertensives.  COPD: Continue bronchodilators  Infrarenal abdominal aortic aneurysm 4 mm: Unchanged from prior study-annual follow-up is required per protocol  BMI: Estimated body mass index is 25.77 kg/m  as calculated from the following:   Height as of this encounter: '6\' 1"'$  (1.854 m).   Weight as of this encounter: 88.6 kg.   Code status:   Code Status: Full Code   DVT Prophylaxis:IV heparin    Family Communication: Spousee at bedside   Disposition Plan: Status is: Inpatient Remains inpatient appropriate because: ongoing evaluation with GI/PT for safe dispo - still on IV antibiotics/heparin gt   Planned Discharge Destination:Home   Diet: Diet Order             DIET DYS 3 Room service appropriate? Yes; Fluid consistency: Thin  Diet effective now                     Antimicrobial agents: Anti-infectives (From admission, onward)    Start     Dose/Rate Route Frequency Ordered Stop   08/02/22 1600  piperacillin-tazobactam (ZOSYN) IVPB 3.375 g        3.375 g 12.5 mL/hr over 240 Minutes Intravenous Every 8 hours 08/02/22 1039     08/02/22 1130  piperacillin-tazobactam (ZOSYN) IVPB 4.5 g        4.5 g 200 mL/hr over 30 Minutes Intravenous  Once 08/02/22 1039 08/02/22 1240   07/31/22 0400  meropenem (MERREM) 1 g in sodium chloride 0.9 % 100 mL IVPB  Status:  Discontinued        1 g 200 mL/hr over 30 Minutes Intravenous Every 8 hours 07/31/22 0252 08/02/22 1039   07/30/22 2245  piperacillin-tazobactam (ZOSYN) IVPB 3.375 g  Status:  Discontinued        3.375 g 12.5 mL/hr over 240 Minutes Intravenous Every 8 hours 07/30/22 2239 07/31/22 0239        MEDICATIONS: Scheduled Meds:  amiodarone  200 mg Oral Daily   insulin aspart  0-6 Units Subcutaneous Q4H   polyethylene glycol  17 g Oral BID   senna-docusate  1 tablet Oral BID   tamsulosin  0.8 mg Oral Daily   Continuous Infusions:  heparin 1,150 Units/hr (08/06/22 0615)   piperacillin-tazobactam (ZOSYN)  IV 3.375 g (08/06/22 0042)   PRN Meds:.acetaminophen **OR** acetaminophen, HYDROcodone-acetaminophen, HYDROmorphone (DILAUDID) injection, ondansetron **OR** ondansetron (ZOFRAN) IV, umeclidinium bromide   I have  personally reviewed following labs and imaging studies  LABORATORY DATA: CBC: Recent Labs  Lab 07/30/22 2048 07/31/22 0257 08/01/22 0717 08/02/22 0703 08/03/22 0819 08/04/22 0509 08/05/22 0525 08/06/22 0404  WBC 9.1   < > 12.7* 15.3* 10.4 7.7 6.9 8.1  NEUTROABS 8.2*  --  11.6*  --   --   --   --   --   HGB 13.7   < > 12.8* 12.7* 12.3* 12.7* 11.5* 11.2*  HCT 43.7   < > 38.3* 38.1* 36.9* 38.1* 34.0* 32.6*  MCV 95.4   < > 90.8 90.1 88.7 90.1 89.0 87.9  PLT 298   < > 215 221 161 160 134* 138*   < > = values in this interval not displayed.     Basic Metabolic Panel: Recent Labs  Lab 07/30/22 2048 07/31/22 0257 08/01/22 0717 08/02/22 0703 08/03/22 0819  NA 138 136 139 136 139  K 4.1 3.8 3.9 3.2* 3.4*  CL 99 102 102 101 105  CO2 22 20* '26 23 26  '$ GLUCOSE 203* 188* 110* 109*  95  BUN 23 23 28* 24* 17  CREATININE 1.30* 1.16 1.09 0.98 0.79  CALCIUM 9.3 8.8* 8.4* 8.3* 8.3*  MG  --   --   --   --  1.7     GFR: Estimated Creatinine Clearance: 84.6 mL/min (by C-G formula based on SCr of 0.79 mg/dL).  Liver Function Tests: Recent Labs  Lab 07/30/22 2048 07/31/22 0257 08/01/22 0717 08/02/22 0703 08/03/22 0819  AST '26 24 22 19 18  '$ ALT '26 23 23 20 17  '$ ALKPHOS 68 59 53 72 64  BILITOT 1.3* 0.9 1.1 1.0 1.0  PROT 6.3* 5.5* 5.2* 5.5* 5.0*  ALBUMIN 3.3* 2.8* 2.4* 2.3* 2.0*    Recent Labs  Lab 07/30/22 2048  LIPASE 21    No results for input(s): "AMMONIA" in the last 168 hours.  Coagulation Profile: No results for input(s): "INR", "PROTIME" in the last 168 hours.  Cardiac Enzymes: No results for input(s): "CKTOTAL", "CKMB", "CKMBINDEX", "TROPONINI" in the last 168 hours.  BNP (last 3 results) No results for input(s): "PROBNP" in the last 8760 hours.  Lipid Profile: No results for input(s): "CHOL", "HDL", "LDLCALC", "TRIG", "CHOLHDL", "LDLDIRECT" in the last 72 hours.  Thyroid Function Tests: No results for input(s): "TSH", "T4TOTAL", "FREET4", "T3FREE",  "THYROIDAB" in the last 72 hours.  Anemia Panel: No results for input(s): "VITAMINB12", "FOLATE", "FERRITIN", "TIBC", "IRON", "RETICCTPCT" in the last 72 hours.  Urine analysis:    Component Value Date/Time   COLORURINE YELLOW 04/25/2022 1232   APPEARANCEUR CLEAR 04/25/2022 1232   LABSPEC <1.005 (L) 04/25/2022 1232   PHURINE 6.0 04/25/2022 1232   GLUCOSEU 100 (A) 04/25/2022 1232   HGBUR TRACE (A) 04/25/2022 1232   BILIRUBINUR NEGATIVE 04/25/2022 1232   KETONESUR NEGATIVE 04/25/2022 1232   PROTEINUR NEGATIVE 04/25/2022 1232   UROBILINOGEN 4.0 (H) 10/02/2015 2135   NITRITE POSITIVE (A) 04/25/2022 1232   LEUKOCYTESUR SMALL (A) 04/25/2022 1232    Sepsis Labs: Lactic Acid, Venous    Component Value Date/Time   LATICACIDVEN 6.2 (HH) 07/31/2022 0528    MICROBIOLOGY: No results found for this or any previous visit (from the past 240 hour(s)).  RADIOLOGY STUDIES/RESULTS: No results found.   LOS: 6 days   Little Ishikawa, DO  [facility]    To contact the attending provider between 7A-7P or the covering provider during after hours 7P-7A, please log into the web site [email or web address] and access using universal [place] password for that web site. If you do not have the password, please call the hospital operator.  08/06/2022, 7:54 AM

## 2022-08-06 NOTE — Progress Notes (Signed)
ANTICOAGULATION CONSULT NOTE - Follow Up Consult  Pharmacy Consult for Heparin Indication: atrial fibrillation  No Known Allergies  Patient Measurements: Height: '6\' 1"'$  (185.4 cm) Weight: 88.6 kg (195 lb 5.2 oz) IBW/kg (Calculated) : 79.9 Heparin Dosing Weight: 80 kg  Vital Signs: Temp: 98.2 F (36.8 C) (08/10 1000) Temp Source: Oral (08/10 1000) BP: 151/77 (08/10 1000) Pulse Rate: 90 (08/10 1000)  Labs: Recent Labs    08/04/22 0509 08/05/22 0525 08/06/22 0404 08/06/22 1308  HGB 12.7* 11.5* 11.2*  --   HCT 38.1* 34.0* 32.6*  --   PLT 160 134* 138*  --   HEPARINUNFRC 0.36 0.31 0.23* 0.35    Estimated Creatinine Clearance: 84.6 mL/min (by C-G formula based on SCr of 0.79 mg/dL).  Assessment: 81 y/o male whose pertinent medication history includes Afib and CHF. Pharmacy has been consulted for IV heparin dosing, no bolus per MD. PTA Eliquis is on hold. His last dose of Eliquis was on 07/26/22 PTA.   Heparin level was low this morning (0.23) on 1050 units/hr and infusion rate was increased to 1150 units/hr. Level is now therapeutic (0.35).  Platelet count normal on admit, and has trended down. No further decrease today. No bleeding reported.  Goal of Therapy:  Heparin level 0.3-0.7 units/ml Monitor platelets by anticoagulation protocol: Yes   Plan:  Continue heparin drip at 1150 units/hr Daily heparin level and CBC.  Eliquis on hold.  Arty Baumgartner, RPh 08/06/2022,2:27 PM

## 2022-08-06 NOTE — Progress Notes (Signed)
ANTICOAGULATION CONSULT NOTE - follow-up  Pharmacy Consult for heparin infusion Indication: atrial fibrillation  No Known Allergies  Patient Measurements: Height: '6\' 1"'$  (185.4 cm) Weight: 88 kg (194 lb 0.1 oz) IBW/kg (Calculated) : 79.9 Heparin Dosing Weight: 80.7  Vital Signs: Temp: 98.3 F (36.8 C) (08/09 2000) Temp Source: Oral (08/09 2000) BP: 144/74 (08/09 2000) Pulse Rate: 87 (08/09 2000)  Labs: Recent Labs    08/03/22 0819 08/04/22 0509 08/05/22 0525 08/06/22 0404  HGB 12.3* 12.7* 11.5* 11.2*  HCT 36.9* 38.1* 34.0* 32.6*  PLT 161 160 134* 138*  HEPARINUNFRC 0.51 0.36 0.31 0.23*  CREATININE 0.79  --   --   --      Estimated Creatinine Clearance: 84.6 mL/min (by C-G formula based on SCr of 0.79 mg/dL).    Medications:  Infusions:   heparin 1,050 Units/hr (08/05/22 2323)   piperacillin-tazobactam (ZOSYN)  IV 3.375 g (08/06/22 0042)    Assessment: Douglas Hurst is a 81 y/o male whose pertinent medication history includes Afib and CHF. Pharmacy has been consulted to initiate a heparin infusion, no bolus per MD. His last dose of Eliquis was charted as 7/30.   8/10 AM update:  Heparin level low Hgb stable from yesterday   Goal of Therapy:  Heparin level: 0.3-0.7 Monitor platelets by anticoagulation protocol: Yes    Plan:  Inc heparin to 1150 units/hr 1300 heparin level  Douglas Hurst, PharmD, BCPS Clinical Pharmacist Phone: [phone]

## 2022-08-06 NOTE — Progress Notes (Signed)
Physical Therapy Treatment Patient Details Name: Douglas Hurst. MRN: [id number] DOB: [birth year] Today's Date: 08/06/2022   History of Present Illness Patient is a 81 yo male presenting to the ED with severe abdominal pain with radiation to right shoulder on 07/31/22. Patient had underwent ECRP for removal of stones from biliary and pancreatic duct on 8/3. Admitted with acute pancreatitis. PMH includes: bipolar d/o DMII, HTN, HLD, GERD, COPD ,  Afib ,diastolic heart failure, AAA with interim history of acute cholecystitis    PT Comments    Pt received in supine, agreeable to therapy session with encouragement, with emphasis on transfer and pre-gait training. Pt needing up to [place] for functional mobility tasks with RW support. Pt reports 9/10 modified RPE at end of session. Pt continues to benefit from PT services to progress toward functional mobility goals.    Recommendations for follow up therapy are one component of a multi-disciplinary discharge planning process, led by the attending physician.  Recommendations may be updated based on patient status, additional functional criteria and insurance authorization.  Follow Up Recommendations  Acute inpatient rehab (3hours/day)     Assistance Recommended at Discharge Frequent or constant Supervision/Assistance  Patient can return home with the following A lot of help with walking and/or transfers;A lot of help with bathing/dressing/bathroom;Assistance with cooking/housework;Direct supervision/assist for medications management;Direct supervision/assist for financial management;Assist for transportation;Help with stairs or ramp for entrance   Equipment Recommendations  None recommended by PT    Recommendations for Other Services Rehab consult     Precautions / Restrictions Precautions Precautions: Fall Precaution Comments: abdominal pain Restrictions Weight Bearing Restrictions: No     Mobility  Bed Mobility Overal bed mobility:  Needs Assistance Bed Mobility: Rolling, Sit to Sidelying, Sidelying to Sit Rolling: Min guard Sidelying to sit: Mod assist     Sit to sidelying: Min assist General bed mobility comments: cues for reverse log roll for abdominal protection/pain mgmt, pt benefits from visual and verbal cues, modA to raise trunk from HOB ~10*    Transfers Overall transfer level: Needs assistance Equipment used: Rolling walker (2 wheels) Transfers: Sit to/from Stand, Bed to chair/wheelchair/BSC Sit to Stand: Mod assist, From elevated surface           General transfer comment: sidesteps along EOB after standing to RW; fatigues quickly; modA lifting assist and for anterior weight shift    Ambulation/Gait Ambulation/Gait assistance: Mod assist   Assistive device: Rolling walker (2 wheels)       Pre-gait activities: standing hip flexion x5 reps each LE; sidesteps x3 along EOB     Stairs             Wheelchair Mobility    Modified Rankin (Stroke Patients Only)       Balance Overall balance assessment: Needs assistance Sitting-balance support: Feet supported, Single extremity supported Sitting balance-Leahy Scale: Fair     Standing balance support: Bilateral upper extremity supported, During functional activity, Reliant on assistive device for balance Standing balance-Leahy Scale: Poor Standing balance comment: reliant on RW and external support due to posterior lean                            Cognition Arousal/Alertness: Awake/alert Behavior During Therapy: Flat affect Overall Cognitive Status: Within Functional Limits for tasks assessed  General Comments: following 1-step commands well, increased time.        Exercises General Exercises - Lower Extremity Ankle Circles/Pumps: AROM, Both, 10 reps, Supine Long Arc Quad: AROM, Both, 10 reps, Seated Hip Flexion/Marching: AROM, Both, 15 reps, Seated, Standing (x5 reps  ea standing, x10 reps ea seated) Other Exercises Other Exercises: encouraged IS use, IS placed on bed table    General Comments General comments (skin integrity, edema, etc.): SpO2 88-92% on 2L O2 [place] with exertion; HR to 120 bpm with standing tasks; BP 142/80 supine post exertion      Pertinent Vitals/Pain Pain Assessment Pain Assessment: Faces Faces Pain Scale: Hurts little more Pain Location: abdomen (more to R side) Pain Descriptors / Indicators: Grimacing, Guarding Pain Intervention(s): Monitored during session, Limited activity within patient's tolerance, Repositioned    Home Living                          Prior Function            PT Goals (current goals can now be found in the care plan section) Acute Rehab PT Goals Patient Stated Goal: to get stronger and pain reduced PT Goal Formulation: With patient/family Time For Goal Achievement: 08/17/22 Progress towards PT goals: Progressing toward goals    Frequency    Min 3X/week      PT Plan Current plan remains appropriate    Co-evaluation              AM-PAC PT "6 Clicks" Mobility   Outcome Measure  Help needed turning from your back to your side while in a flat bed without using bedrails?: A Little Help needed moving from lying on your back to sitting on the side of a flat bed without using bedrails?: A Lot (mod cues) Help needed moving to and from a bed to a chair (including a wheelchair)?: A Lot (mod cues) Help needed standing up from a chair using your arms (e.g., wheelchair or bedside chair)?: A Lot (mod cues) Help needed to walk in hospital room?: Total Help needed climbing 3-5 steps with a railing? : Total 6 Click Score: 11    End of Session Equipment Utilized During Treatment: Gait belt;Oxygen Activity Tolerance: Patient limited by pain;Patient limited by fatigue Patient left: with call bell/phone within reach;in bed;with bed alarm set;Other (comment) (heels floated, bed in chair  posture) Nurse Communication: Mobility status (pt condom cath fell off while he stood at bedside) PT Visit Diagnosis: Muscle weakness (generalized) (M62.81);Difficulty in walking, not elsewhere classified (R26.2);Other abnormalities of gait and mobility (R26.89);Pain Pain - Right/Left: Right     Time: 1219-7588 PT Time Calculation (min) (ACUTE ONLY): 18 min  Charges:  $Therapeutic Exercise: 8-22 mins                     Douglas Hurst P., PTA Acute Rehabilitation Services Secure Chat Preferred 9a-5:30pm Office: [place] 08/06/2022, 6:23 PM

## 2022-08-06 NOTE — TOC Initial Note (Signed)
Transition of Care (TOC) - Initial/Assessment Note    Patient Details  Name: Douglas Hurst. MRN: [id number] Date of Birth: 1941/08/09  Transition of Care [facility]) CM/SW Contact:    Benard Halsted, LCSW Phone Number: 08/06/2022, 11:17 AM  Clinical Narrative:                 CSW received consult for possible SNF placement at time of discharge if CIR is unable to accept him. CSW spoke with patient and spouse (and pastor came into room later). Patient's spouse reported that she is currently unable to care for patient at their home given patient's current physical needs and fall risk. Patient expressed understanding of PT recommendation and is agreeable to SNF placement at time of discharge if CIR cannot accept him, though he prefers to return home. Patient's spouse reports preference for [facility] as it is closer to home than [place] was. CSW discussed insurance authorization process and spouse reported understanding that patient only has about a week left before Medicare requires out of pocket copays for SNF. CSW will send out referrals for review and provide bed offers as available.   Skilled Nursing Rehab Facilities-   [email or web address]   Ratings out of 5 stars (the highest)   Name Address  Phone # [place] [facility] Overall  [facility] [place] [address] [place] '5 5 2 4  '$ [facility]  [address] [place], [place] [phone] '4 2 5 5  '$ [facility] [place], [place] '1 1 1 1  '$ [place], [place] '2 2 4 4  '$ [facility] [address] [place] '1 1 2 1  '$ [place]. [address], [place] [phone] '3 2 4 4  '$ [facility] [address], [place] '5 1 3 3  '$ [facility] [address] [place] '5 2 3 4  '$ [address] ([place]) [place], [place]  [phone] '4 1 2 1  '$ [facility] [phone], [place] [phone] '4 1 1 1  '$ [facility] [address] [facility] [phone] '3 1 2 1  '$ [facility] ([place]) [place]. [place], [place] [phone] '4 1 1 1  '$ [facility] [address] [phone] '4 2 4 4          '$ [place] '4 1 3 2  '$ [facility] [place], [place] '3 1 5 4  '$ [facility], [place], [place] [phone] '1 1 1 1  '$ [facility] [address] [phone] '2 2 3 3          '$ [address] (no [facility]) [address], [place] [phone] '5 5 5 5  '$ [facility] (No Humana) [address] [phone] [address], [place] '4 1 4 3  '$ [facility] (No UHC) [place], [place] [phone] '5 5 5 5  '$ [facility] [address], [address] [phone] '2 3 5 5  '$ [facility] [address] [place] '1 1 2 1  '$ [facility] [address], [address] [place] '3 1 1 1  '$ [place], [place] '5 2 4 5  '$ [facility]  [address], [place] '2 2 1 1  '$ [facility] [address], [place] 3 2  [place], [place] '2 2 2 2          '$ [facility] [address], [place] [phone] '1 1 1 1  '$ [place] [address] [place]  [phone] '2 4 2 2  '$ [facility] [address], [place] [phone] '4 2 3 3  '$ [place] [address], [place] '2 1 1 1  '$ [place] (No Humana) [address], [place] [phone] '2 2 3 3  '$ [facility] [address] [place] [phone] '2 1 1 1          '$ [facility] [place], [place] '5 4 5 5  '$ [facility])  [address], [place] '2 1 2 1  '$ [facility]) [place] [address] [place] '3 1 4 3  '$ [place] [address], [place] '3 3 4 4  '$ [address] [place], [place] '2 3 1 1  '$ [facility] [place]) [address] [place] [phone] '2 1 4 3     '$ Expected Discharge Plan: [place] Barriers to Discharge: Insurance Authorization, SNF Pending bed offer   Patient Goals and CMS Choice Patient states their goals for this hospitalization and ongoing recovery are:: Rehab CMS Medicare.gov Compare Post Acute Care list provided to:: Patient Choice offered to / list presented to : Patient, Spouse  Expected Discharge Plan and Services Expected Discharge Plan: [place] In-house Referral: Clinical Social Work   Post Acute Care Choice: [place] Living arrangements for the past 2 months: [place]                                      Prior Living Arrangements/Services Living arrangements for the past 2 months: Single Family Home Lives with:: Spouse Patient language and need for interpreter reviewed:: Yes Do you feel safe going back to the place where you live?: Yes      Need for Family Participation in Patient Care: No (Comment) Care giver support system in place?: Yes (comment)   Criminal Activity/Legal Involvement Pertinent to Current Situation/Hospitalization: No - Comment as needed  Activities of Daily Living      Permission Sought/Granted Permission sought to share information with : Facility Sport and exercise psychologist, Family Supports Permission granted to share information with : Yes, Verbal Permission Granted  Share Information with NAME: Jamas Lav  Permission granted to share info w AGENCY: SNFs  Permission granted to share info w Relationship: Spouse  Permission granted to share info w Contact Information:  [phone]  Emotional Assessment Appearance:: Appears stated age Attitude/Demeanor/Rapport: Engaged Affect (typically observed): Accepting, Appropriate Orientation: : Oriented to Self, Oriented to Place, Oriented to  Time, Oriented to Situation Alcohol / Substance Use: Not Applicable Psych Involvement: No (comment)  Admission diagnosis:  Acute pancreatitis [K85.90] Post-ERCP acute pancreatitis [K91.89, K85.90] Patient Active Problem List   Diagnosis Date Noted   Acute pancreatitis 07/31/2022   Choledocholithiasis 07/08/2022   Hyperbilirubinemia 04/30/2022   Hypoxia 04/29/2022   Normocytic anemia 57/26/2035   Acute systolic heart failure (HCC)    PAF (paroxysmal atrial fibrillation) (HCC)    Acute calculous cholecystitis 04/26/2022   Abdominal aortic aneurysm ([place]) 04/25/2022   Bipolar disorder ([place]) 04/25/2022   Chronic obstructive pulmonary disease ([place]) 04/25/2022   Diabetic  peripheral neuropathy associated with type 2 diabetes mellitus ([place]) 04/25/2022   Gastroesophageal reflux disease 04/25/2022   Essential hypertension 04/25/2022   Mixed hyperlipidemia 04/25/2022   sepsis secondary to calculus of bile duct (any) with acute cholecystitis 04/25/2022   Sepsis ([place]) 04/25/2022   PCP:  Lavone Orn, MD Pharmacy[facility]   [id number] [address] [id number] [id number]: [phone]: [phone]    Social Determinants of Health (SDOH) Interventions    Readmission Risk Interventions     No data to display

## 2022-08-07 DIAGNOSIS — R1084 Generalized abdominal pain: Secondary | ICD-10-CM | POA: Diagnosis not present

## 2022-08-07 DIAGNOSIS — R1013 Epigastric pain: Secondary | ICD-10-CM | POA: Diagnosis not present

## 2022-08-07 DIAGNOSIS — R1011 Right upper quadrant pain: Secondary | ICD-10-CM | POA: Diagnosis not present

## 2022-08-07 DIAGNOSIS — K859 Acute pancreatitis without necrosis or infection, unspecified: Secondary | ICD-10-CM | POA: Diagnosis not present

## 2022-08-07 DIAGNOSIS — R932 Abnormal findings on diagnostic imaging of liver and biliary tract: Secondary | ICD-10-CM | POA: Diagnosis not present

## 2022-08-07 LAB — CBC
HCT: 31.8 % — ABNORMAL LOW (ref 39.0–52.0)
Hemoglobin: 10.9 g/dL — ABNORMAL LOW (ref 13.0–17.0)
MCH: 30.6 pg (ref 26.0–34.0)
MCHC: 34.3 g/dL (ref 30.0–36.0)
MCV: 89.3 fL (ref 80.0–100.0)
Platelets: 132 10*3/uL — ABNORMAL LOW (ref 150–400)
RBC: 3.56 MIL/uL — ABNORMAL LOW (ref 4.22–5.81)
RDW: 15.8 % — ABNORMAL HIGH (ref 11.5–15.5)
WBC: 11.6 10*3/uL — ABNORMAL HIGH (ref 4.0–10.5)
nRBC: 0 % (ref 0.0–0.2)

## 2022-08-07 LAB — GLUCOSE, CAPILLARY
Glucose-Capillary: 110 mg/dL — ABNORMAL HIGH (ref 70–99)
Glucose-Capillary: 132 mg/dL — ABNORMAL HIGH (ref 70–99)
Glucose-Capillary: 117 mg/dL — ABNORMAL HIGH (ref 70–99)
Glucose-Capillary: 132 mg/dL — ABNORMAL HIGH (ref 70–99)

## 2022-08-07 LAB — HEPARIN LEVEL (UNFRACTIONATED)
Heparin Unfractionated: 0.15 IU/mL — ABNORMAL LOW (ref 0.30–0.70)
Heparin Unfractionated: 0.3 IU/mL (ref 0.30–0.70)

## 2022-08-07 NOTE — Progress Notes (Signed)
Inpatient Rehab Admissions Coordinator:   Spoke to pts spouse and updated on progress towards CIR.  Still following for pt to be closer to medically ready to begin insurance auth process.    Shann Medal, PT, DPT Admissions Coordinator [phone] 08/07/22  11:22 AM

## 2022-08-07 NOTE — Progress Notes (Signed)
ANTICOAGULATION CONSULT NOTE - Follow Up Consult  Pharmacy Consult for Heparin Indication: atrial fibrillation  No Known Allergies  Patient Measurements: Height: '6\' 1"'$  (185.4 cm) Weight: 88.6 kg (195 lb 5.2 oz) IBW/kg (Calculated) : 79.9 Heparin Dosing Weight: 80 kg  Vital Signs: Temp: 97.8 F (36.6 C) (08/11 1217) Temp Source: Oral (08/11 1217) BP: 157/78 (08/11 1217) Pulse Rate: 84 (08/11 1217)  Labs: Recent Labs    08/05/22 0525 08/06/22 0404 08/06/22 1308 08/07/22 0319 08/07/22 1230  HGB 11.5* 11.2*  --  10.9*  --   HCT 34.0* 32.6*  --  31.8*  --   PLT 134* 138*  --  132*  --   HEPARINUNFRC 0.31 0.23* 0.35 0.15* 0.30     Estimated Creatinine Clearance: 84.6 mL/min (by C-G formula based on SCr of 0.79 mg/dL).  Assessment: 81 y/o male whose pertinent medication history includes Afib and CHF. Pharmacy has been consulted for IV heparin dosing, no bolus per MD. PTA Eliquis is on hold. His last dose of Eliquis was on 07/26/22 PTA.   Heparin level was low again this morning (0.15) on 1150 units/hr and infusion rate was increased to 1250 units/hr. Level is now low therapeutic (0.30).  Platelet count normal on admit, and has trended down. About the same today, 130s x 3 days. No bleeding reported.  Goal of Therapy:  Heparin level 0.3-0.7 units/ml Monitor platelets by anticoagulation protocol: Yes   Plan:  Continue heparin drip at 1250 units/hr Daily heparin level and CBC.  Eliquis on hold.  Arty Baumgartner, RPh 08/07/2022,3:27 PM

## 2022-08-07 NOTE — Consult Note (Signed)
   [facility] CM Inpatient Consult   08/07/2022  Douglas Hurst. [birth year] [id number]  [place] [facility] [ACO] Patient: [name]   Insurance referral noted for [facility] RNCM note  Brieffed for readmission prevention  Primary Care Provider:  Lavone Orn, MD [facility]  is an Independent embedded provider with a Chronic Care Management team and program, and is listed for the transition of care follow up and appointments.  Patient was screened for Embedded practice service needs for chronic care management and patient encounter noted active with CCM team with another care management team - [facility].  Please contact for further questions,  Natividad Brood, RN BSN [place] [facility] Liaison  [phone] business mobile phone Toll free office [phone]  Fax number: [phone] [email or web address]

## 2022-08-07 NOTE — Progress Notes (Signed)
ANTICOAGULATION CONSULT NOTE - follow-up  Pharmacy Consult for heparin infusion Indication: atrial fibrillation  No Known Allergies  Patient Measurements: Height: '6\' 1"'$  (185.4 cm) Weight: 88.6 kg (195 lb 5.2 oz) IBW/kg (Calculated) : 79.9 Heparin Dosing Weight: 80.7  Vital Signs: BP: 142/80 (08/10 1700)  Labs: Recent Labs    08/05/22 0525 08/06/22 0404 08/06/22 1308 08/07/22 0319  HGB 11.5* 11.2*  --  10.9*  HCT 34.0* 32.6*  --  31.8*  PLT 134* 138*  --  132*  HEPARINUNFRC 0.31 0.23* 0.35 0.15*     Estimated Creatinine Clearance: 84.6 mL/min (by C-G formula based on SCr of 0.79 mg/dL).    Medications:  Infusions:   heparin 1,150 Units/hr (08/07/22 0054)   piperacillin-tazobactam (ZOSYN)  IV 3.375 g (08/07/22 0057)    Assessment: GJ is a 81 y/o male whose pertinent medication history includes Afib and CHF. Pharmacy has been consulted to initiate a heparin infusion, no bolus per MD. His last dose of Eliquis was charted as 7/30.   8/11 AM update:  Heparin level low Hgb stable   Goal of Therapy:  Heparin level: 0.3-0.7 units/mL Monitor platelets by anticoagulation protocol: Yes   Plan:  Inc heparin to 1250 units/hr 1300 heparin level  Narda Bonds, PharmD, BCPS Clinical Pharmacist Phone: [phone]

## 2022-08-07 NOTE — Progress Notes (Signed)
Physical Therapy Treatment Patient Details Name: Douglas Hurst. MRN: [id number] DOB: Jul 13, 1941 Today's Date: 08/07/2022   History of Present Illness Patient is a 81 yo male presenting to the ED with severe abdominal pain with radiation to right shoulder on 07/31/22. Patient had underwent ECRP for removal of stones from biliary and pancreatic duct on 8/3. Admitted with acute pancreatitis. PMH includes: bipolar d/o DMII, HTN, HLD, GERD, COPD ,  Afib ,diastolic heart failure, AAA with interim history of acute cholecystitis    PT Comments    Pt received in supine, agreeable to therapy session, eager to wean O2. Pt SpO2 92-94% on RA with good pleth signal with seated/standing tasks so [place] left off once therapy session complete, RN notified. Emphasis on seated/standing LE exercises transfer training and short gait trial at bedside, pt needing up to modA to perform functional tasks with RW support. Pt continues to benefit from PT services to progress toward functional mobility goals. Pt with improving activity tolerance and making good progress toward goals, reporting only 5/10 modified RPE (fatigue) at end of session.  Recommendations for follow up therapy are one component of a multi-disciplinary discharge planning process, led by the attending physician.  Recommendations may be updated based on patient status, additional functional criteria and insurance authorization.  Follow Up Recommendations  Acute inpatient rehab (3hours/day)     Assistance Recommended at Discharge Frequent or constant Supervision/Assistance  Patient can return home with the following A lot of help with walking and/or transfers;A lot of help with bathing/dressing/bathroom;Assistance with cooking/housework;Direct supervision/assist for medications management;Direct supervision/assist for financial management;Assist for transportation;Help with stairs or ramp for entrance   Equipment Recommendations  None recommended by PT     Recommendations for Other Services Rehab consult     Precautions / Restrictions Precautions Precautions: Fall Precaution Comments: abdominal pain Restrictions Weight Bearing Restrictions: No     Mobility  Bed Mobility Overal bed mobility: Needs Assistance Bed Mobility: Rolling, Sidelying to Sit Rolling: Min guard Sidelying to sit: Min assist       General bed mobility comments: cues for log roll for abdominal protection/pain mgmt, pt benefits from visual and verbal cues    Transfers Overall transfer level: Needs assistance Equipment used: Rolling walker (2 wheels) Transfers: Sit to/from Stand, Bed to chair/wheelchair/BSC Sit to Stand: From elevated surface, Min assist   Step pivot transfers: Min assist       General transfer comment: cues for posture, RW mgmt, and line awareness    Ambulation/Gait Ambulation/Gait assistance: Mod assist, Min assist Gait Distance (Feet): 12 Feet Assistive device: Rolling walker (2 wheels) Gait Pattern/deviations: Leaning posteriorly, Trunk flexed, Narrow base of support, Decreased dorsiflexion - right, Decreased dorsiflexion - left, Shuffle, Step-to pattern       General Gait Details: forward/backward in front of chair, pt c/o fatigue after 12f and deferring second gait trial; pt also performed step pivot ~3-479ffrom bed to chair prior to this trial. minA for forward stepping and modA at times with backward stepping.   Stairs             Wheelchair Mobility    Modified Rankin (Stroke Patients Only)       Balance Overall balance assessment: Needs assistance Sitting-balance support: Feet supported, Single extremity supported Sitting balance-Leahy Scale: Fair     Standing balance support: Bilateral upper extremity supported, During functional activity, Reliant on assistive device for balance Standing balance-Leahy Scale: Poor Standing balance comment: reliant on RW and external support due to posterior  lean                             Cognition Arousal/Alertness: Awake/alert Behavior During Therapy: Flat affect Overall Cognitive Status: Within Functional Limits for tasks assessed                                 General Comments: following 1-step commands well, increased time.        Exercises General Exercises - Lower Extremity Ankle Circles/Pumps: AROM, Both, 10 reps, Supine Long Arc Quad: AROM, Both, 10 reps, Seated Hip Flexion/Marching: AROM, Both, Seated, Standing, 20 reps (x10 reps ea standing, x10 reps ea seated) Other Exercises Other Exercises: IS x 10 reps (200-500 mL)    General Comments General comments (skin integrity, edema, etc.): SpO2 92-94% on RA once [place] removed, RN notified, left off.      Pertinent Vitals/Pain Pain Assessment Pain Assessment: Faces Faces Pain Scale: Hurts a little bit Pain Location: abdomen (more to R side) Pain Descriptors / Indicators: Grimacing, Guarding Pain Intervention(s): Monitored during session, Repositioned           PT Goals (current goals can now be found in the care plan section) Acute Rehab PT Goals Patient Stated Goal: to get stronger and pain reduced PT Goal Formulation: With patient/family Time For Goal Achievement: 08/17/22 Progress towards PT goals: Progressing toward goals    Frequency    Min 3X/week      PT Plan Current plan remains appropriate       AM-PAC PT "6 Clicks" Mobility   Outcome Measure  Help needed turning from your back to your side while in a flat bed without using bedrails?: A Little Help needed moving from lying on your back to sitting on the side of a flat bed without using bedrails?: A Lot (mod cues) Help needed moving to and from a bed to a chair (including a wheelchair)?: A Lot (mod cues) Help needed standing up from a chair using your arms (e.g., wheelchair or bedside chair)?: A Lot (mod cues) Help needed to walk in hospital room?: Total Help needed climbing 3-5  steps with a railing? : Total 6 Click Score: 11    End of Session Equipment Utilized During Treatment: Gait belt;Oxygen (pt weaned to RA and VSS) Activity Tolerance: Patient limited by pain;Patient limited by fatigue Patient left: with call bell/phone within reach;in bed;with bed alarm set;Other (comment) (heels floated, bed in chair posture) Nurse Communication: Mobility status (pt condom cath fell off while he stood at bedside) PT Visit Diagnosis: Muscle weakness (generalized) (M62.81);Difficulty in walking, not elsewhere classified (R26.2);Other abnormalities of gait and mobility (R26.89);Pain Pain - Right/Left: Right     Time: 1914-7829 PT Time Calculation (min) (ACUTE ONLY): 23 min  Charges:  $Gait Training: 8-22 mins $Therapeutic Exercise: 8-22 mins                     Rashad Obeid P., PTA Acute Rehabilitation Services Secure Chat Preferred 9a-5:30pm Office: [place] 08/07/2022, 6:20 PM

## 2022-08-07 NOTE — Progress Notes (Signed)
Subjective: Stool output with suppositories. Pain a bit better today.  Objective: Vital signs in last 24 hours: Temp:  [97.6 F (36.4 C)-98.1 F (36.7 C)] 97.8 F (36.6 C) (08/11 1217) Pulse Rate:  [84-89] 84 (08/11 1217) Resp:  [17-20] 20 (08/11 1217) BP: (142-166)/(78-80) 157/78 (08/11 1217) SpO2:  [90 %-94 %] 94 % (08/11 1217) Weight change:  Last BM Date : 08/01/22  PE: GEN:  NAD ABD:  Soft, mild epigastric tenderness (better than yesterday)  Lab Results: CBC    Component Value Date/Time   WBC 11.6 (H) 08/07/2022 0319   RBC 3.56 (L) 08/07/2022 0319   HGB 10.9 (L) 08/07/2022 0319   HGB 13.5 06/15/2022 1006   HCT 31.8 (L) 08/07/2022 0319   HCT 41.3 06/15/2022 1006   PLT 132 (L) 08/07/2022 0319   PLT 219 06/15/2022 1006   MCV 89.3 08/07/2022 0319   MCV 92 06/15/2022 1006   MCH 30.6 08/07/2022 0319   MCHC 34.3 08/07/2022 0319   RDW 15.8 (H) 08/07/2022 0319   RDW 13.6 06/15/2022 1006   LYMPHSABS 0.5 (L) 08/01/2022 0717   MONOABS 0.3 08/01/2022 0717   EOSABS 0.0 08/01/2022 0717   BASOSABS 0.0 08/01/2022 0717  CMP     Component Value Date/Time   NA 139 08/03/2022 0819   NA 139 06/15/2022 1006   K 3.4 (L) 08/03/2022 0819   CL 105 08/03/2022 0819   CO2 26 08/03/2022 0819   GLUCOSE 95 08/03/2022 0819   BUN 17 08/03/2022 0819   BUN 12 06/15/2022 1006   CREATININE 0.79 08/03/2022 0819   CALCIUM 8.3 (L) 08/03/2022 0819   PROT 5.0 (L) 08/03/2022 0819   PROT 5.8 (L) 06/15/2022 1006   ALBUMIN 2.0 (L) 08/03/2022 0819   ALBUMIN 3.7 06/15/2022 1006   AST 18 08/03/2022 0819   ALT 17 08/03/2022 0819   ALKPHOS 64 08/03/2022 0819   BILITOT 1.0 08/03/2022 0819   BILITOT 0.5 06/15/2022 1006   GFRNONAA >60 08/03/2022 0819   GFRAA >60 10/02/2015 1956   Assessment:  Post-ERCP pancreatitis. Constipation. Abdominal pain.   Has improved but not resolved.  Unclear constipation versus pancreatitis versus both.  Plan:   Full liquid diet (low lactose, low fat), as part of  trying to slowly advance. [facility] will follow; if ongoing pain, and certainly if worsening pain, would need to consider repeat CT scan +/- nasoenteric feeding tube placement. [facility] will follow.   Douglas Hurst 08/07/2022, 1:50 PM   Cell [phone] If no answer or after 5 PM call [phone]

## 2022-08-07 NOTE — Progress Notes (Signed)
PROGRESS NOTE        PATIENT DETAILS Name: Douglas Hurst. Age: 81 y.o. Sex: male Date of Birth: [birth year] Admit Date: 07/30/2022 Admitting Physician Clance Boll, MD UJW:[id number], [name], MD  Brief Summary: Patient is a 81 y.o.  male with history of PAF on Eliquis, HTN, HLD, COPD, chronic HFrEF (recovered EF)-with recent history of cholecystitis-s/p cholecystectomy but with retained CBD stone.  ERCP here was unsuccessful.  Patient was referred to [facility] underwent advanced endoscopy via transduodenal approach for ERCP on 8/3 at [facility] with Dr. Okey Dupre -he subsequently presented to the ED with severe abdominal pain-was found to have pancreatitis and admitted to the hospitalist service.  Significant events: 8/3>> admit to [facility]-abdominal pain after ERCP at [facility]-found to have acute pancreatitis 8/5>> A-fib RVR-no response to IV Lopressor-blood pressure soft-amiodarone infusion started.  After discussion with GI-heparin started without bolus. 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 8/5>>Repeat urgent CT-AP overnight due to worsening pain without over changes  Consults: GI Phone consult-cards-Dr Crenshaw  Subjective: No acute issues or events overnight denies nausea vomiting diarrhea constipation headache fevers chills or chest pain.  Abdominal pain going but markedly improved since admission.  No further urinary symptoms or pain or obstructive process.  Family continues to worry about patient's debility given his bedbound status and generalized weakness, we continue to discuss ongoing physical therapy and likely disposition to rehab given his baseline.  Objective: Vitals: Blood pressure (!)  166/78, pulse 89, temperature 97.6 F (36.4 C), temperature source Oral, resp. rate 20, height '6\' 1"'$  (1.854 m), weight 88.6 kg, SpO2 94 %.   Exam: Gen Exam:Alert awake-not in any distress HEENT:atraumatic, normocephalic Chest: B/L clear to auscultation anteriorly CVS:S1S2 irregular Abdomen: Soft, nontender nondistended. Extremities:no edema Neurology: Non focal Skin: no rash  Pertinent Labs/Radiology:    Latest Ref Rng & Units 08/07/2022    3:19 AM 08/06/2022    4:04 AM 08/05/2022    5:25 AM  CBC  WBC 4.0 - 10.5 K/uL 11.6  8.1  6.9   Hemoglobin 13.0 - 17.0 g/dL 10.9  11.2  11.5   Hematocrit 39.0 - 52.0 % 31.8  32.6  34.0   Platelets 150 - 400 K/uL 132  138  134     Lab Results  Component Value Date   NA 139 08/03/2022   K 3.4 (L) 08/03/2022   CL 105 08/03/2022   CO2 26 08/03/2022      Assessment/Plan:  Acute pancreatitis (possibly necrotizing)-duodenitis with contained duodenal perforation (transduodenal approach to puncture extrahepatic bile duct to place guidewire into duodenum-subsequently ERCP scope inserted and stone removed):  Epigastric pain resolving - advancing diet slowly Appreciate GI insight/recs Continue zosyn per GI recs Repeat CT shows stable disease -no indication for further imaging  PAF with RVR:  Provoked by pancreatitis/pain/acute illness- - Controlled on PO amiodarone (off gtt) -Heparin ongoing - transition to eliquis at DC -Consider metoprolol but currently well controlled  Ambulatory dysfunction Multifactorial - follow PT/OT recs  AKI, resolved Complicated by BPH and lower urinary tract obstruction  Resolved-likely hemodynamically mediated. Resume flomax - symptoms improving somewhat already; not back to baseline  Chronic HFrEF with recovered  EF: Some mild shortness of breath-but also anxious-no obvious signs of volume overload-monitor closely while on IVF for pancreatitis.    DM-2: CBGs stable with SSI.  Resume metformin when closer to  discharge.  HTN: BP soft-continue to hold all antihypertensives.  COPD: Continue bronchodilators  Infrarenal abdominal aortic aneurysm 4 mm: Unchanged from prior study-annual follow-up is required per protocol  BMI: Estimated body mass index is 25.77 kg/m as calculated from the following:   Height as of this encounter: '6\' 1"'$  (1.854 m).   Weight as of this encounter: 88.6 kg.   Code status:   Code Status: Full Code   DVT Prophylaxis:IV heparin    Family Communication: Spouse and brother at bedside   Disposition Plan: Status is: Inpatient Remains inpatient appropriate because: ongoing evaluation with GI/PT for safe dispo - still on IV antibiotics/heparin gtt   Planned Discharge Destination:Home   Diet: Diet Order             Diet clear liquid Room service appropriate? Yes; Fluid consistency: Thin  Diet effective now                     Antimicrobial agents: Anti-infectives (From admission, onward)    Start     Dose/Rate Route Frequency Ordered Stop   08/02/22 1600  piperacillin-tazobactam (ZOSYN) IVPB 3.375 g        3.375 g 12.5 mL/hr over 240 Minutes Intravenous Every 8 hours 08/02/22 1039     08/02/22 1130  piperacillin-tazobactam (ZOSYN) IVPB 4.5 g        4.5 g 200 mL/hr over 30 Minutes Intravenous  Once 08/02/22 1039 08/02/22 1240   07/31/22 0400  meropenem (MERREM) 1 g in sodium chloride 0.9 % 100 mL IVPB  Status:  Discontinued        1 g 200 mL/hr over 30 Minutes Intravenous Every 8 hours 07/31/22 0252 08/02/22 1039   07/30/22 2245  piperacillin-tazobactam (ZOSYN) IVPB 3.375 g  Status:  Discontinued        3.375 g 12.5 mL/hr over 240 Minutes Intravenous Every 8 hours 07/30/22 2239 07/31/22 0239        MEDICATIONS: Scheduled Meds:  amiodarone  200 mg Oral Daily   insulin aspart  0-6 Units Subcutaneous TID WC   polyethylene glycol  17 g Oral TID   senna-docusate  1 tablet Oral BID   tamsulosin  0.8 mg Oral Daily   Continuous Infusions:   heparin 1,250 Units/hr (08/07/22 0501)   piperacillin-tazobactam (ZOSYN)  IV 3.375 g (08/07/22 0057)   PRN Meds:.acetaminophen **OR** acetaminophen, HYDROcodone-acetaminophen, HYDROmorphone (DILAUDID) injection, ondansetron **OR** ondansetron (ZOFRAN) IV, umeclidinium bromide   I have personally reviewed following labs and imaging studies  LABORATORY DATA: CBC: Recent Labs  Lab 08/01/22 0717 08/02/22 0703 08/03/22 0819 08/04/22 0509 08/05/22 0525 08/06/22 0404 08/07/22 0319  WBC 12.7*   < > 10.4 7.7 6.9 8.1 11.6*  NEUTROABS 11.6*  --   --   --   --   --   --   HGB 12.8*   < > 12.3* 12.7* 11.5* 11.2* 10.9*  HCT 38.3*   < > 36.9* 38.1* 34.0* 32.6* 31.8*  MCV 90.8   < > 88.7 90.1 89.0 87.9 89.3  PLT 215   < > 161 160 134* 138* 132*   < > = values in this interval not displayed.     Basic Metabolic Panel: Recent Labs  Lab 08/01/22 0717 08/02/22 0703 08/03/22 0819  NA 139 136 139  K 3.9 3.2* 3.4*  CL 102 101 105  CO2 '26 23 26  '$ GLUCOSE 110* 109* 95  BUN 28* 24* 17  CREATININE 1.09 0.98 0.79  CALCIUM 8.4* 8.3* 8.3*  MG  --   --  1.7     GFR: Estimated Creatinine Clearance: 84.6 mL/min (by C-G formula based on SCr of 0.79 mg/dL).  Liver Function Tests: Recent Labs  Lab 08/01/22 0717 08/02/22 0703 08/03/22 0819  AST '22 19 18  '$ ALT '23 20 17  '$ ALKPHOS 53 72 64  BILITOT 1.1 1.0 1.0  PROT 5.2* 5.5* 5.0*  ALBUMIN 2.4* 2.3* 2.0*    No results for input(s): "LIPASE", "AMYLASE" in the last 168 hours.  No results for input(s): "AMMONIA" in the last 168 hours.  Coagulation Profile: No results for input(s): "INR", "PROTIME" in the last 168 hours.  Cardiac Enzymes: No results for input(s): "CKTOTAL", "CKMB", "CKMBINDEX", "TROPONINI" in the last 168 hours.  BNP (last 3 results) No results for input(s): "PROBNP" in the last 8760 hours.  Lipid Profile: No results for input(s): "CHOL", "HDL", "LDLCALC", "TRIG", "CHOLHDL", "LDLDIRECT" in the last 72 hours.  Thyroid  Function Tests: No results for input(s): "TSH", "T4TOTAL", "FREET4", "T3FREE", "THYROIDAB" in the last 72 hours.  Anemia Panel: No results for input(s): "VITAMINB12", "FOLATE", "FERRITIN", "TIBC", "IRON", "RETICCTPCT" in the last 72 hours.  Urine analysis:    Component Value Date/Time   COLORURINE YELLOW 04/25/2022 1232   APPEARANCEUR CLEAR 04/25/2022 1232   LABSPEC <1.005 (L) 04/25/2022 1232   PHURINE 6.0 04/25/2022 1232   GLUCOSEU 100 (A) 04/25/2022 1232   HGBUR TRACE (A) 04/25/2022 1232   BILIRUBINUR NEGATIVE 04/25/2022 1232   KETONESUR NEGATIVE 04/25/2022 1232   PROTEINUR NEGATIVE 04/25/2022 1232   UROBILINOGEN 4.0 (H) 10/02/2015 2135   NITRITE POSITIVE (A) 04/25/2022 1232   LEUKOCYTESUR SMALL (A) 04/25/2022 1232    Sepsis Labs: Lactic Acid, Venous    Component Value Date/Time   LATICACIDVEN 6.2 (HH) 07/31/2022 0528    MICROBIOLOGY: No results found for this or any previous visit (from the past 240 hour(s)).  RADIOLOGY STUDIES/RESULTS: No results found.   LOS: 7 days   Little Ishikawa, DO  [facility]    To contact the attending provider between 7A-7P or the covering provider during after hours 7P-7A, please log into the web site [email or web address] and access using universal [place] password for that web site. If you do not have the password, please call the hospital operator.  08/07/2022, 8:29 AM

## 2022-08-08 DIAGNOSIS — R1084 Generalized abdominal pain: Secondary | ICD-10-CM | POA: Diagnosis not present

## 2022-08-08 DIAGNOSIS — R1013 Epigastric pain: Secondary | ICD-10-CM | POA: Diagnosis not present

## 2022-08-08 DIAGNOSIS — R1011 Right upper quadrant pain: Secondary | ICD-10-CM | POA: Diagnosis not present

## 2022-08-08 DIAGNOSIS — R932 Abnormal findings on diagnostic imaging of liver and biliary tract: Secondary | ICD-10-CM | POA: Diagnosis not present

## 2022-08-08 DIAGNOSIS — K859 Acute pancreatitis without necrosis or infection, unspecified: Secondary | ICD-10-CM | POA: Diagnosis not present

## 2022-08-08 LAB — CBC
HCT: 30.8 % — ABNORMAL LOW (ref 39.0–52.0)
Hemoglobin: 10.3 g/dL — ABNORMAL LOW (ref 13.0–17.0)
MCH: 29.9 pg (ref 26.0–34.0)
MCHC: 33.4 g/dL (ref 30.0–36.0)
MCV: 89.3 fL (ref 80.0–100.0)
Platelets: 150 10*3/uL (ref 150–400)
RBC: 3.45 MIL/uL — ABNORMAL LOW (ref 4.22–5.81)
RDW: 15.7 % — ABNORMAL HIGH (ref 11.5–15.5)
WBC: 11.8 10*3/uL — ABNORMAL HIGH (ref 4.0–10.5)
nRBC: 0 % (ref 0.0–0.2)

## 2022-08-08 LAB — HEPARIN LEVEL (UNFRACTIONATED): Heparin Unfractionated: 0.35 IU/mL (ref 0.30–0.70)

## 2022-08-08 LAB — GLUCOSE, CAPILLARY
Glucose-Capillary: 97 mg/dL (ref 70–99)
Glucose-Capillary: 94 mg/dL (ref 70–99)
Glucose-Capillary: 117 mg/dL — ABNORMAL HIGH (ref 70–99)
Glucose-Capillary: 113 mg/dL — ABNORMAL HIGH (ref 70–99)

## 2022-08-08 NOTE — Progress Notes (Signed)
[facility] Gastroenterology Progress Note  Suhas Estis. 81 y.o. July 22, 1941  CC: Abdominal pain, pancreatitis/duodenitis   Subjective: Patient seen and examined at bedside.  Recent events noted.  Had worsening abdominal pain after being on regular diet.  Feeling better now.  No abdominal pain this morning.  ROS : Afebrile, positive for weakness   Objective: Vital signs in last 24 hours: Vitals:   08/08/22 0809 08/08/22 1134  BP: (!) 154/79 (!) 163/76  Pulse: 85 83  Resp: 20 20  Temp: 98 F (36.7 C) 97.8 F (36.6 C)  SpO2: 97% 95%    Physical Exam:  General -elderly patient.  In mild distress from pain ,oxygen by nasal cannula.  Somewhat agitated Abdomen -nontender, abdomen is soft, bowel sounds present, no peritoneal signs Chest -anterior exam only, poor air entry bilaterally Lower extremity -no edema  Lab Results: No results for input(s): "NA", "K", "CL", "CO2", "GLUCOSE", "BUN", "CREATININE", "CALCIUM", "MG", "PHOS" in the last 72 hours.  No results for input(s): "AST", "ALT", "ALKPHOS", "BILITOT", "PROT", "ALBUMIN" in the last 72 hours.  Recent Labs    08/07/22 0319 08/08/22 0338  WBC 11.6* 11.8*  HGB 10.9* 10.3*  HCT 31.8* 30.8*  MCV 89.3 89.3  PLT 132* 150   No results for input(s): "LABPROT", "INR" in the last 72 hours.    Assessment/Plan: -Worsening abdominal pain s/p complicated ERCP at [facility] as mentioned below on 07/30/2022    CT scan shows pancreatitis involving pancreatic head as well as thickening of the duodenum.  Normal lipase.  Normal LFTs.     He underwent EUS,[place] ERCP and sphincterotomy with 2 stones removal. Regular ERCP was unsuccessful.  Dr. Okey Dupre did a transduodenal approach to puncture the extrahepatic bile duct to pass a guidewire in to the duodenum.  Subsequently ERCP scope was inserted and cannulation was performed over the guidewire.  Largest one was around 12 mm in size.  2 stones were removed from CBD.   -History of A-fib.   On heparin drip now  Recommendations --------------------------- -Patient noted worsening abdominal pain after being on regular diet.  Pain improved now.  No abdominal pain this morning. -Okay to resume full liquid diet. -If starts having pain after taking full liquid diet, recommend repeat CT abdomen with IV contrast -Continue other supportive care with antibiotics for now. -GI will follow   Otis Brace MD, [place] 08/08/2022, 11:50 AM  Contact #  [phone]

## 2022-08-08 NOTE — Progress Notes (Signed)
Physical Therapy Treatment Patient Details Name: Douglas Hurst. MRN: [id number] DOB: 29-Dec-1940 Today's Date: 08/08/2022   History of Present Illness Patient is a 81 yo male presenting to the ED with severe abdominal pain with radiation to right shoulder on 07/31/22. Patient had underwent ECRP for removal of stones from biliary and pancreatic duct on 8/3. Admitted with acute pancreatitis. PMH includes: bipolar d/o DMII, HTN, HLD, GERD, COPD ,  Afib ,diastolic heart failure, AAA with interim history of acute cholecystitis    PT Comments    Pt is progressing very slowly.  He is weak and deconditioned.  Attempted gait with chair to follow and he was unable to make it out of the room before he was too fatigued to go further.  Will re-assess daily, but may be more appropriate for SNF level rehab due to slow progress and poor tolerance of activity.  PT will continue to follow acutely for safe mobility progression   Recommendations for follow up therapy are one component of a multi-disciplinary discharge planning process, led by the attending physician.  Recommendations may be updated based on patient status, additional functional criteria and insurance authorization.  Follow Up Recommendations  Acute inpatient rehab (3hours/day)     Assistance Recommended at Discharge Frequent or constant Supervision/Assistance  Patient can return home with the following A lot of help with walking and/or transfers;A lot of help with bathing/dressing/bathroom;Assistance with cooking/housework;Direct supervision/assist for medications management;Direct supervision/assist for financial management;Assist for transportation;Help with stairs or ramp for entrance   Equipment Recommendations  None recommended by PT    Recommendations for Other Services Rehab consult     Precautions / Restrictions Precautions Precautions: Fall Precaution Comments: abdominal pain     Mobility  Bed Mobility Overal bed mobility:  Needs Assistance Bed Mobility: Supine to Sit     Supine to sit: Min assist, HOB elevated     General bed mobility comments: Min hand held assis to come up to sitting EOB with HOB elevated.    Transfers Overall transfer level: Needs assistance Equipment used: Rolling walker (2 wheels) Transfers: Sit to/from Stand Sit to Stand: Min assist           General transfer comment: Min assist to stand from bed with RW cues for safe hand placement and assist to power up over weak legs    Ambulation/Gait Ambulation/Gait assistance: Min assist, +2 safety/equipment Gait Distance (Feet): 15 Feet Assistive device: Rolling walker (2 wheels) Gait Pattern/deviations: Leaning posteriorly, Trunk flexed, Narrow base of support, Decreased dorsiflexion - right, Decreased dorsiflexion - left, Shuffle, Step-to pattern Gait velocity: decreased Gait velocity interpretation: <1.31 ft/sec, indicative of household ambulator   General Gait Details: Pt abmulated a short distance around the end of the bed, but could not make it into the hallway before sitting down in the chair that was pushed behind him and said he was done (did not want to try again).   Stairs             Wheelchair Mobility    Modified Rankin (Stroke Patients Only)       Balance Overall balance assessment: Needs assistance Sitting-balance support: Feet supported, Bilateral upper extremity supported Sitting balance-Leahy Scale: Fair Sitting balance - Comments: close supervision EOB   Standing balance support: Bilateral upper extremity supported Standing balance-Leahy Scale: Poor Standing balance comment: reliant on RW and external support due to posterior lean  Cognition Arousal/Alertness: Awake/alert Behavior During Therapy: Flat affect Overall Cognitive Status: Within Functional Limits for tasks assessed                                          Exercises  General Exercises - Upper Extremity Shoulder Flexion: AROM, Both, 10 reps General Exercises - Lower Extremity Ankle Circles/Pumps: AROM, Both, 10 reps Long Arc Quad: AROM, Both, 10 reps Straight Leg Raises: AROM, Both, 10 reps    General Comments        Pertinent Vitals/Pain Pain Assessment Pain Assessment: No/denies pain Pain Score: 0-No pain    Home Living                          Prior Function            PT Goals (current goals can now be found in the care plan section) Acute Rehab PT Goals Patient Stated Goal: to get stronger and pain reduced Progress towards PT goals: Progressing toward goals    Frequency    Min 3X/week      PT Plan Current plan remains appropriate    Co-evaluation              AM-PAC PT "6 Clicks" Mobility   Outcome Measure  Help needed turning from your back to your side while in a flat bed without using bedrails?: A Little Help needed moving from lying on your back to sitting on the side of a flat bed without using bedrails?: A Little Help needed moving to and from a bed to a chair (including a wheelchair)?: A Little Help needed standing up from a chair using your arms (e.g., wheelchair or bedside chair)?: A Little Help needed to walk in hospital room?: Total Help needed climbing 3-5 steps with a railing? : Total 6 Click Score: 14    End of Session Equipment Utilized During Treatment: Gait belt;Oxygen Activity Tolerance: Patient limited by fatigue Patient left: in chair;with call bell/phone within reach;with chair alarm set;with family/visitor present   PT Visit Diagnosis: Muscle weakness (generalized) (M62.81);Difficulty in walking, not elsewhere classified (R26.2);Other abnormalities of gait and mobility (R26.89);Pain     Time: 7628-3151 PT Time Calculation (min) (ACUTE ONLY): 23 min  Charges:  $Gait Training: 8-22 mins $Therapeutic Exercise: 8-22 mins                     Verdene Lennert, PT, DPT  Acute  Rehabilitation Secure chat is best for contact #[phone] office

## 2022-08-08 NOTE — Progress Notes (Signed)
ANTICOAGULATION CONSULT NOTE - Follow Up Consult  Pharmacy Consult for Heparin Indication: atrial fibrillation  No Known Allergies  Patient Measurements: Height: '6\' 1"'$  (185.4 cm) Weight: 88.6 kg (195 lb 5.2 oz) IBW/kg (Calculated) : 79.9 kg  Heparin Dosing Weight: 80.7 kg  Vital Signs: Temp: 97.4 F (36.3 C) (08/12 0401) Temp Source: Oral (08/12 0401) BP: 144/69 (08/12 0401) Pulse Rate: 84 (08/12 0401)  Labs: Recent Labs    08/06/22 0404 08/06/22 1308 08/07/22 0319 08/07/22 1230 08/08/22 0338  HGB 11.2*  --  10.9*  --  10.3*  HCT 32.6*  --  31.8*  --  30.8*  PLT 138*  --  132*  --  150  HEPARINUNFRC 0.23*   < > 0.15* 0.30 0.35   < > = values in this interval not displayed.     Estimated Creatinine Clearance: 84.6 mL/min (by C-G formula based on SCr of 0.79 mg/dL).  Assessment: 81 y/o male whose pertinent medication history includes Afib and CHF. Pharmacy has been consulted for IV heparin dosing for Afib, no bolus per MD. PTA Eliquis is on hold. His last dose of Eliquis was on 07/26/22 PTA.  Heparin level today is therapeutic at 0.35, on 1250 units/hr. Hgb slowly trending down, plt normal on admit, trended down, now increasing and back to normal . No line issues or signs/symptoms of bleeding per RN.   Goal of Therapy:  Heparin level 0.3-0.7 units/ml Monitor platelets by anticoagulation protocol: Yes   Plan:  Continue heparin 1250 units/hr. Hold Eliquis. Daily CBC, heparin level. Monitor for signs/symptoms of bleeding.  Jeneen Rinks, Pharm.D PGY1 Pharmacy Resident 08/08/2022 7:41 AM

## 2022-08-08 NOTE — Progress Notes (Signed)
PROGRESS NOTE        PATIENT DETAILS Name: Douglas Hurst. Age: 81 y.o. Sex: male Date of Birth: 1941/08/26 Admit Date: 07/30/2022 Admitting Physician Clance Boll, MD [id number], [name], MD  Brief Summary: Patient is a 81 y.o.  male with history of PAF on Eliquis, HTN, HLD, COPD, chronic HFrEF (recovered EF)-with recent history of cholecystitis-s/p cholecystectomy but with retained CBD stone.  ERCP here was unsuccessful.  Patient was referred to [facility] underwent advanced endoscopy via transduodenal approach for ERCP on 8/3 at [facility] with Dr. Okey Dupre -he subsequently presented to the ED with severe abdominal pain-was found to have pancreatitis and admitted to the hospitalist service.  Significant events: 8/3>> admit to [facility]-abdominal pain after ERCP at [facility]-found to have acute pancreatitis 8/5>> A-fib RVR-no response to IV Lopressor-blood pressure soft-amiodarone infusion started.  After discussion with GI-heparin started without bolus. 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 8/5>>Repeat urgent CT-AP overnight due to worsening pain without over changes  Consults: GI Phone consult-cards-Dr Crenshaw  Subjective: No acute issues or events overnight denies nausea vomiting diarrhea constipation headache fevers chills or chest pain.  Abdominal pain resolved, back on clear liquid diet, patient requesting advancement of diet which we discussed would need to be quite slow given his intolerance of diet advancement previously.  Objective: Vitals: Blood pressure (!) 154/79, pulse 85, temperature 98 F (36.7 C), temperature source Axillary, resp. rate 20, height '6\' 1"'$  (1.854 m), weight 88.6 kg,  SpO2 97 %.   Exam: Gen Exam:Alert awake-not in any distress HEENT:atraumatic, normocephalic Chest: B/L clear to auscultation anteriorly CVS:S1S2 irregular Abdomen: Soft, nontender nondistended. Extremities:no edema Neurology: Non focal Skin: no rash  Pertinent Labs/Radiology:    Latest Ref Rng & Units 08/08/2022    3:38 AM 08/07/2022    3:19 AM 08/06/2022    4:04 AM  CBC  WBC 4.0 - 10.5 K/uL 11.8  11.6  8.1   Hemoglobin 13.0 - 17.0 g/dL 10.3  10.9  11.2   Hematocrit 39.0 - 52.0 % 30.8  31.8  32.6   Platelets 150 - 400 K/uL 150  132  138     Lab Results  Component Value Date   NA 139 08/03/2022   K 3.4 (L) 08/03/2022   CL 105 08/03/2022   CO2 26 08/03/2022      Assessment/Plan:  Acute pancreatitis (possibly necrotizing)-duodenitis with contained duodenal perforation (transduodenal approach to puncture extrahepatic bile duct to place guidewire into duodenum-subsequently ERCP scope inserted and stone removed):  Epigastric pain resolved- advancing diet slowly(did not tolerate advancement earlier this week to a more regular diet) Appreciate GI insight/recs Continue zosyn per GI recs Repeat CT shows stable disease -no indication for further imaging  PAF with RVR:  Provoked by pancreatitis/pain/acute illness- - Controlled on PO amiodarone (off gtt) -Heparin ongoing - transition to eliquis at DC -Consider metoprolol but currently well controlled  Ambulatory dysfunction Multifactorial - follow PT/OT recs  AKI, resolved Complicated by BPH and lower urinary tract obstruction  Resolved-likely hemodynamically mediated. Resume flomax - symptoms improving somewhat already; not back to baseline  Chronic HFrEF with recovered EF: Some mild shortness of breath-but also anxious-no obvious  signs of volume overload-monitor closely while on IVF for pancreatitis.    DM-2: CBGs stable with SSI.  Resume metformin when closer to discharge.  HTN: BP soft-continue to hold all  antihypertensives.  COPD: Continue bronchodilators  Infrarenal abdominal aortic aneurysm 4 mm: Unchanged from prior study-annual follow-up is required per protocol  BMI: Estimated body mass index is 25.77 kg/m as calculated from the following:   Height as of this encounter: '6\' 1"'$  (1.854 m).   Weight as of this encounter: 88.6 kg.   Code status:   Code Status: Full Code   DVT Prophylaxis:IV heparin    Family Communication: Spouse and brother at bedside   Disposition Plan: Status is: Inpatient Remains inpatient appropriate because: ongoing evaluation with GI/PT for safe dispo -PT currently recommending inpatient rehab   Planned Discharge Destination:Home   Diet: Diet Order             Diet full liquid Room service appropriate? Yes; Fluid consistency: Thin  Diet effective now                     Antimicrobial agents: Anti-infectives (From admission, onward)    Start     Dose/Rate Route Frequency Ordered Stop   08/02/22 1600  piperacillin-tazobactam (ZOSYN) IVPB 3.375 g        3.375 g 12.5 mL/hr over 240 Minutes Intravenous Every 8 hours 08/02/22 1039     08/02/22 1130  piperacillin-tazobactam (ZOSYN) IVPB 4.5 g        4.5 g 200 mL/hr over 30 Minutes Intravenous  Once 08/02/22 1039 08/02/22 1240   07/31/22 0400  meropenem (MERREM) 1 g in sodium chloride 0.9 % 100 mL IVPB  Status:  Discontinued        1 g 200 mL/hr over 30 Minutes Intravenous Every 8 hours 07/31/22 0252 08/02/22 1039   07/30/22 2245  piperacillin-tazobactam (ZOSYN) IVPB 3.375 g  Status:  Discontinued        3.375 g 12.5 mL/hr over 240 Minutes Intravenous Every 8 hours 07/30/22 2239 07/31/22 0239        MEDICATIONS: Scheduled Meds:  amiodarone  200 mg Oral Daily   insulin aspart  0-6 Units Subcutaneous TID WC   polyethylene glycol  17 g Oral TID   senna-docusate  1 tablet Oral BID   tamsulosin  0.8 mg Oral Daily   Continuous Infusions:  heparin 1,250 Units/hr (08/07/22 2100)    piperacillin-tazobactam (ZOSYN)  IV 3.375 g (08/08/22 0030)   PRN Meds:.acetaminophen **OR** acetaminophen, HYDROcodone-acetaminophen, HYDROmorphone (DILAUDID) injection, ondansetron **OR** ondansetron (ZOFRAN) IV, umeclidinium bromide   I have personally reviewed following labs and imaging studies  LABORATORY DATA: CBC: Recent Labs  Lab 08/04/22 0509 08/05/22 0525 08/06/22 0404 08/07/22 0319 08/08/22 0338  WBC 7.7 6.9 8.1 11.6* 11.8*  HGB 12.7* 11.5* 11.2* 10.9* 10.3*  HCT 38.1* 34.0* 32.6* 31.8* 30.8*  MCV 90.1 89.0 87.9 89.3 89.3  PLT 160 134* 138* 132* 150     Basic Metabolic Panel: Recent Labs  Lab 08/02/22 0703 08/03/22 0819  NA 136 139  K 3.2* 3.4*  CL 101 105  CO2 23 26  GLUCOSE 109* 95  BUN 24* 17  CREATININE 0.98 0.79  CALCIUM 8.3* 8.3*  MG  --  1.7     GFR: Estimated Creatinine Clearance: 84.6 mL/min (by C-G formula based on SCr of 0.79 mg/dL).  Liver Function Tests: Recent Labs  Lab 08/02/22 0703 08/03/22 0819  AST 19 18  ALT 20 17  ALKPHOS 72 64  BILITOT 1.0 1.0  PROT 5.5* 5.0*  ALBUMIN 2.3* 2.0*    No results for input(s): "LIPASE", "AMYLASE" in the last 168 hours.  No results for input(s): "AMMONIA" in the last 168 hours.  Coagulation Profile: No results for input(s): "INR", "PROTIME" in the last 168 hours.  Cardiac Enzymes: No results for input(s): "CKTOTAL", "CKMB", "CKMBINDEX", "TROPONINI" in the last 168 hours.  BNP (last 3 results) No results for input(s): "PROBNP" in the last 8760 hours.  Lipid Profile: No results for input(s): "CHOL", "HDL", "LDLCALC", "TRIG", "CHOLHDL", "LDLDIRECT" in the last 72 hours.  Thyroid Function Tests: No results for input(s): "TSH", "T4TOTAL", "FREET4", "T3FREE", "THYROIDAB" in the last 72 hours.  Anemia Panel: No results for input(s): "VITAMINB12", "FOLATE", "FERRITIN", "TIBC", "IRON", "RETICCTPCT" in the last 72 hours.  Urine analysis:    Component Value Date/Time   COLORURINE YELLOW  04/25/2022 1232   APPEARANCEUR CLEAR 04/25/2022 1232   LABSPEC <1.005 (L) 04/25/2022 1232   PHURINE 6.0 04/25/2022 1232   GLUCOSEU 100 (A) 04/25/2022 1232   HGBUR TRACE (A) 04/25/2022 1232   BILIRUBINUR NEGATIVE 04/25/2022 1232   KETONESUR NEGATIVE 04/25/2022 1232   PROTEINUR NEGATIVE 04/25/2022 1232   UROBILINOGEN 4.0 (H) 10/02/2015 2135   NITRITE POSITIVE (A) 04/25/2022 1232   LEUKOCYTESUR SMALL (A) 04/25/2022 1232    Sepsis Labs: Lactic Acid, Venous    Component Value Date/Time   LATICACIDVEN 6.2 (HH) 07/31/2022 0528    MICROBIOLOGY: No results found for this or any previous visit (from the past 240 hour(s)).  RADIOLOGY STUDIES/RESULTS: No results found.   LOS: 8 days   Little Ishikawa, DO  [facility]    To contact the attending provider between 7A-7P or the covering provider during after hours 7P-7A, please log into the web site [email or web address] and access using universal [place] password for that web site. If you do not have the password, please call the hospital operator.  08/08/2022, 8:15 AM

## 2022-08-09 ENCOUNTER — Inpatient Hospital Stay (HOSPITAL_COMMUNITY): Payer: Medicare HMO

## 2022-08-09 DIAGNOSIS — R1013 Epigastric pain: Secondary | ICD-10-CM | POA: Diagnosis not present

## 2022-08-09 DIAGNOSIS — K859 Acute pancreatitis without necrosis or infection, unspecified: Secondary | ICD-10-CM | POA: Diagnosis not present

## 2022-08-09 DIAGNOSIS — R932 Abnormal findings on diagnostic imaging of liver and biliary tract: Secondary | ICD-10-CM | POA: Diagnosis not present

## 2022-08-09 DIAGNOSIS — R1011 Right upper quadrant pain: Secondary | ICD-10-CM | POA: Diagnosis not present

## 2022-08-09 DIAGNOSIS — R1084 Generalized abdominal pain: Secondary | ICD-10-CM | POA: Diagnosis not present

## 2022-08-09 LAB — GLUCOSE, CAPILLARY
Glucose-Capillary: 104 mg/dL — ABNORMAL HIGH (ref 70–99)
Glucose-Capillary: 118 mg/dL — ABNORMAL HIGH (ref 70–99)
Glucose-Capillary: 116 mg/dL — ABNORMAL HIGH (ref 70–99)
Glucose-Capillary: 127 mg/dL — ABNORMAL HIGH (ref 70–99)

## 2022-08-09 LAB — CBC
HCT: 33.9 % — ABNORMAL LOW (ref 39.0–52.0)
Hemoglobin: 11.3 g/dL — ABNORMAL LOW (ref 13.0–17.0)
MCH: 30 pg (ref 26.0–34.0)
MCHC: 33.3 g/dL (ref 30.0–36.0)
MCV: 89.9 fL (ref 80.0–100.0)
Platelets: 176 10*3/uL (ref 150–400)
RBC: 3.77 MIL/uL — ABNORMAL LOW (ref 4.22–5.81)
RDW: 15.8 % — ABNORMAL HIGH (ref 11.5–15.5)
WBC: 11.4 10*3/uL — ABNORMAL HIGH (ref 4.0–10.5)
nRBC: 0 % (ref 0.0–0.2)

## 2022-08-09 LAB — HEPARIN LEVEL (UNFRACTIONATED): Heparin Unfractionated: 0.35 IU/mL (ref 0.30–0.70)

## 2022-08-09 MED ORDER — IOHEXOL 300 MG/ML  SOLN
50.0000 mL | Freq: Once | INTRAMUSCULAR | Status: AC | PRN
  Administered 2022-08-09: 50 mL via INTRAVENOUS

## 2022-08-09 NOTE — Progress Notes (Signed)
[facility] Gastroenterology Progress Note  Douglas Hurst. 81 y.o. 1941-12-22  CC: Abdominal pain, pancreatitis/duodenitis   Subjective: Patient seen and examined at bedside.  Continues to have abdominal pain.  Remains on liquid diet.  ROS : Afebrile, positive for weakness   Objective: Vital signs in last 24 hours: Vitals:   08/09/22 0313 08/09/22 0758  BP: (!) 161/76 (!) 160/70  Pulse: 87 85  Resp: 18 20  Temp: 97.8 F (36.6 C) 97.6 F (36.4 C)  SpO2: 91% 93%    Physical Exam:  General -elderly patient.  In mild distress from pain ,oxygen by nasal cannula.  Somewhat agitated Abdomen -mild epigastric tenderness to palpation, abdomen is soft, bowel sounds present, no peritoneal signs Chest -anterior exam only, poor air entry bilaterally Lower extremity -no edema  Lab Results: No results for input(s): "NA", "K", "CL", "CO2", "GLUCOSE", "BUN", "CREATININE", "CALCIUM", "MG", "PHOS" in the last 72 hours.  No results for input(s): "AST", "ALT", "ALKPHOS", "BILITOT", "PROT", "ALBUMIN" in the last 72 hours.  Recent Labs    08/08/22 0338 08/09/22 0339  WBC 11.8* 11.4*  HGB 10.3* 11.3*  HCT 30.8* 33.9*  MCV 89.3 89.9  PLT 150 176   No results for input(s): "LABPROT", "INR" in the last 72 hours.    Assessment/Plan: -Worsening abdominal pain s/p complicated ERCP at [facility] as mentioned below on 07/30/2022    CT scan shows pancreatitis involving pancreatic head as well as thickening of the duodenum.  Normal lipase.  Normal LFTs.     He underwent EUS,[place] ERCP and sphincterotomy with 2 stones removal. Regular ERCP was unsuccessful.  Dr. Okey Dupre did a transduodenal approach to puncture the extrahepatic bile duct to pass a guidewire in to the duodenum.  Subsequently ERCP scope was inserted and cannulation was performed over the guidewire.  Largest one was around 12 mm in size.  2 stones were removed from CBD.   -History of A-fib.  On heparin drip  now  Recommendations --------------------------- -Patient continues to have mild abdominal pain.  -We will get repeat CT abdomen with IV contrast for follow-up.  If CT scan shows improvement, we may be able to change IV antibiotics to oral Augmentin and also change anticoagulation from heparin drip to Eliquis -Continue liquid diet for now -Repeat labs in the morning -Discussed with family as well as hospitalist   Otis Brace MD, [place] 08/09/2022, 10:25 AM  Contact #  [phone]

## 2022-08-09 NOTE — Progress Notes (Signed)
PROGRESS NOTE        PATIENT DETAILS Name: Douglas Hurst. Age: 81 y.o. Sex: male Date of Birth: Jan 10, 1941 Admit Date: 07/30/2022 Admitting Physician Clance Boll, MD ZOX:[id number], [name], MD  Brief Summary: Patient is a 81 y.o.  male with history of PAF on Eliquis, HTN, HLD, COPD, chronic HFrEF (recovered EF)-with recent history of cholecystitis-s/p cholecystectomy but with retained CBD stone.  ERCP here was unsuccessful.  Patient was referred to [facility] underwent advanced endoscopy via transduodenal approach for ERCP on 8/3 at [facility] with Dr. Okey Dupre -he subsequently presented to the ED with severe abdominal pain-was found to have pancreatitis and admitted to the hospitalist service.  Significant events: 8/3>> admit to [facility]-abdominal pain after ERCP at [facility]-found to have acute pancreatitis 8/5>> A-fib RVR-no response to IV Lopressor-blood pressure soft-amiodarone infusion started.  After discussion with GI-heparin started without bolus. 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 8/5>>Repeat urgent CT-AP overnight due to worsening pain without over changes 8/13>> CT shows enlarged perihepatic fluid - likely expanding pseudocyst  Consults: GI Phone consult-cards-Dr Crenshaw  Subjective: No acute issues or events overnight denies nausea vomiting diarrhea constipation headache fevers chills or chest pain.  Abdominal pain resolved, back on clear liquid diet, patient requesting advancement of diet which we discussed would need to be quite slow given his intolerance of diet advancement  previously.  Objective: Vitals: Blood pressure (!) 160/70, pulse 85, temperature 97.6 F (36.4 C), temperature source Oral, resp. rate 20, height '6\' 1"'$  (1.854 m), weight 86.4 kg, SpO2 93 %.   Exam: Gen Exam:Alert awake-not in any distress HEENT:atraumatic, normocephalic Chest: B/L clear to auscultation anteriorly CVS:S1S2 irregular Abdomen: Soft, minimally tender diffusely, nondistended. Extremities:no edema Neurology: Non focal Skin: no rash  Pertinent Labs/Radiology:    Latest Ref Rng & Units 08/09/2022    3:39 AM 08/08/2022    3:38 AM 08/07/2022    3:19 AM  CBC  WBC 4.0 - 10.5 K/uL 11.4  11.8  11.6   Hemoglobin 13.0 - 17.0 g/dL 11.3  10.3  10.9   Hematocrit 39.0 - 52.0 % 33.9  30.8  31.8   Platelets 150 - 400 K/uL 176  150  132     Lab Results  Component Value Date   NA 139 08/03/2022   K 3.4 (L) 08/03/2022   CL 105 08/03/2022   CO2 26 08/03/2022      Assessment/Plan:  Acute pancreatitis (possibly necrotizing)-duodenitis with contained duodenal perforation (transduodenal approach to puncture extrahepatic bile duct to place guidewire into duodenum-subsequently ERCP scope inserted and stone removed):  Epigastric pain resolved- advancing diet slowly(did not tolerate advancement earlier this week to a more regular diet) Appreciate GI insight/recs Continue zosyn per GI recs Repeat CT 8/13 shows enlarging pseudocyst  PAF with RVR:  Provoked by pancreatitis/pain/acute illness- - Controlled on PO amiodarone (off gtt) -Heparin ongoing - transition to eliquis at DC -Consider metoprolol but currently well controlled  Ambulatory dysfunction Multifactorial - follow PT/OT recs  AKI, resolved Complicated by BPH and lower urinary tract obstruction  Resolved-likely hemodynamically mediated. Resume flomax -  symptoms improving somewhat already; not back to baseline  Chronic HFrEF with recovered EF: Some mild shortness of breath-but also anxious-no obvious signs of volume  overload-monitor closely while on IVF for pancreatitis.    DM-2: CBGs stable with SSI.  Resume metformin when closer to discharge.  HTN: BP soft-continue to hold all antihypertensives.  COPD: Continue bronchodilators  Infrarenal abdominal aortic aneurysm 4 mm: Unchanged from prior study-annual follow-up is required per protocol  BMI: Estimated body mass index is 25.13 kg/m as calculated from the following:   Height as of this encounter: '6\' 1"'$  (1.854 m).   Weight as of this encounter: 86.4 kg.   Code status:   Code Status: Full Code   DVT Prophylaxis:IV heparin    Family Communication: Spouse and brother at bedside   Disposition Plan: Status is: Inpatient Remains inpatient appropriate because: ongoing evaluation with GI/PT for safe dispo -PT currently recommending inpatient rehab   Planned Discharge Destination:Home   Diet: Diet Order             Diet full liquid Room service appropriate? Yes; Fluid consistency: Thin  Diet effective now                     Antimicrobial agents: Anti-infectives (From admission, onward)    Start     Dose/Rate Route Frequency Ordered Stop   08/02/22 1600  piperacillin-tazobactam (ZOSYN) IVPB 3.375 g        3.375 g 12.5 mL/hr over 240 Minutes Intravenous Every 8 hours 08/02/22 1039     08/02/22 1130  piperacillin-tazobactam (ZOSYN) IVPB 4.5 g        4.5 g 200 mL/hr over 30 Minutes Intravenous  Once 08/02/22 1039 08/02/22 1240   07/31/22 0400  meropenem (MERREM) 1 g in sodium chloride 0.9 % 100 mL IVPB  Status:  Discontinued        1 g 200 mL/hr over 30 Minutes Intravenous Every 8 hours 07/31/22 0252 08/02/22 1039   07/30/22 2245  piperacillin-tazobactam (ZOSYN) IVPB 3.375 g  Status:  Discontinued        3.375 g 12.5 mL/hr over 240 Minutes Intravenous Every 8 hours 07/30/22 2239 07/31/22 0239        MEDICATIONS: Scheduled Meds:  amiodarone  200 mg Oral Daily   insulin aspart  0-6 Units Subcutaneous TID WC    polyethylene glycol  17 g Oral TID   senna-docusate  1 tablet Oral BID   tamsulosin  0.8 mg Oral Daily   Continuous Infusions:  heparin 1,250 Units/hr (08/08/22 1859)   piperacillin-tazobactam (ZOSYN)  IV 3.375 g (08/09/22 0146)   PRN Meds:.acetaminophen **OR** acetaminophen, HYDROcodone-acetaminophen, HYDROmorphone (DILAUDID) injection, ondansetron **OR** ondansetron (ZOFRAN) IV, umeclidinium bromide   I have personally reviewed following labs and imaging studies  LABORATORY DATA: CBC: Recent Labs  Lab 08/05/22 0525 08/06/22 0404 08/07/22 0319 08/08/22 0338 08/09/22 0339  WBC 6.9 8.1 11.6* 11.8* 11.4*  HGB 11.5* 11.2* 10.9* 10.3* 11.3*  HCT 34.0* 32.6* 31.8* 30.8* 33.9*  MCV 89.0 87.9 89.3 89.3 89.9  PLT 134* 138* 132* 150 176     Basic Metabolic Panel: Recent Labs  Lab 08/03/22 0819  NA 139  K 3.4*  CL 105  CO2 26  GLUCOSE 95  BUN 17  CREATININE 0.79  CALCIUM 8.3*  MG 1.7     GFR: Estimated Creatinine Clearance: 84.6 mL/min (by C-G formula based on SCr of 0.79 mg/dL).  Liver Function Tests: Recent Labs  Lab 08/03/22 0819  AST  18  ALT 17  ALKPHOS 64  BILITOT 1.0  PROT 5.0*  ALBUMIN 2.0*    No results for input(s): "LIPASE", "AMYLASE" in the last 168 hours.  No results for input(s): "AMMONIA" in the last 168 hours.  Coagulation Profile: No results for input(s): "INR", "PROTIME" in the last 168 hours.  Cardiac Enzymes: No results for input(s): "CKTOTAL", "CKMB", "CKMBINDEX", "TROPONINI" in the last 168 hours.  BNP (last 3 results) No results for input(s): "PROBNP" in the last 8760 hours.  Lipid Profile: No results for input(s): "CHOL", "HDL", "LDLCALC", "TRIG", "CHOLHDL", "LDLDIRECT" in the last 72 hours.  Thyroid Function Tests: No results for input(s): "TSH", "T4TOTAL", "FREET4", "T3FREE", "THYROIDAB" in the last 72 hours.  Anemia Panel: No results for input(s): "VITAMINB12", "FOLATE", "FERRITIN", "TIBC", "IRON", "RETICCTPCT" in the  last 72 hours.  Urine analysis:    Component Value Date/Time   COLORURINE YELLOW 04/25/2022 1232   APPEARANCEUR CLEAR 04/25/2022 1232   LABSPEC <1.005 (L) 04/25/2022 1232   PHURINE 6.0 04/25/2022 1232   GLUCOSEU 100 (A) 04/25/2022 1232   HGBUR TRACE (A) 04/25/2022 1232   BILIRUBINUR NEGATIVE 04/25/2022 1232   KETONESUR NEGATIVE 04/25/2022 1232   PROTEINUR NEGATIVE 04/25/2022 1232   UROBILINOGEN 4.0 (H) 10/02/2015 2135   NITRITE POSITIVE (A) 04/25/2022 1232   LEUKOCYTESUR SMALL (A) 04/25/2022 1232    Sepsis Labs: Lactic Acid, Venous    Component Value Date/Time   LATICACIDVEN 6.2 (HH) 07/31/2022 0528    MICROBIOLOGY: No results found for this or any previous visit (from the past 240 hour(s)).  RADIOLOGY STUDIES/RESULTS: No results found.   LOS: 9 days   Little Ishikawa, DO  [facility]    To contact the attending provider between 7A-7P or the covering provider during after hours 7P-7A, please log into the web site [email or web address] and access using universal [place] password for that web site. If you do not have the password, please call the hospital operator.  08/09/2022, 8:31 AM

## 2022-08-09 NOTE — Progress Notes (Signed)
ANTICOAGULATION CONSULT NOTE - Follow Up Consult  Pharmacy Consult for Heparin Indication: atrial fibrillation  No Known Allergies  Patient Measurements: Height: '6\' 1"'$  (185.4 cm) Weight: 86.4 kg (190 lb 7.6 oz) IBW/kg (Calculated) : 79.9 kg  Heparin Dosing Weight: 80.7 kg  Vital Signs: Temp: 97.6 F (36.4 C) (08/13 0758) Temp Source: Oral (08/13 0758) BP: 160/70 (08/13 0758) Pulse Rate: 85 (08/13 0758)  Labs: Recent Labs    08/07/22 0319 08/07/22 1230 08/08/22 0338 08/09/22 0339  HGB 10.9*  --  10.3* 11.3*  HCT 31.8*  --  30.8* 33.9*  PLT 132*  --  150 176  HEPARINUNFRC 0.15* 0.30 0.35 0.35     Estimated Creatinine Clearance: 84.6 mL/min (by C-G formula based on SCr of 0.79 mg/dL).  Assessment: 81 y/o male whose pertinent medication history includes Afib and CHF. Pharmacy has been consulted for IV heparin dosing for Afib, no bolus per MD. PTA Eliquis is on hold. His last dose of Eliquis was on 07/26/22 PTA.  Heparin level today is therapeutic at 0.35, on 1250 units/hr. Hgb improving, plt normal on admit, trended down, now increasing and back to normal . No line issues or signs/symptoms of bleeding per RN.  Goal of Therapy:  Heparin level 0.3-0.7 units/ml Monitor platelets by anticoagulation protocol: Yes   Plan:  Continue heparin 1250 units/hr. Hold Eliquis. Daily CBC, heparin level. Monitor for signs/symptoms of bleeding.  Jeneen Rinks, Pharm.D PGY1 Pharmacy Resident 08/09/2022 8:02 AM

## 2022-08-10 DIAGNOSIS — K858 Other acute pancreatitis without necrosis or infection: Secondary | ICD-10-CM | POA: Diagnosis not present

## 2022-08-10 LAB — COMPREHENSIVE METABOLIC PANEL
ALT: 16 U/L (ref 0–44)
AST: 17 U/L (ref 15–41)
Albumin: 1.8 g/dL — ABNORMAL LOW (ref 3.5–5.0)
Alkaline Phosphatase: 73 U/L (ref 38–126)
Anion gap: 7 (ref 5–15)
BUN: 6 mg/dL — ABNORMAL LOW (ref 8–23)
CO2: 28 mmol/L (ref 22–32)
Calcium: 8.4 mg/dL — ABNORMAL LOW (ref 8.9–10.3)
Chloride: 102 mmol/L (ref 98–111)
Creatinine, Ser: 0.88 mg/dL (ref 0.61–1.24)
GFR, Estimated: >60 mL/min (ref >60)
Glucose, Bld: 113 mg/dL — ABNORMAL HIGH (ref 70–99)
Potassium: 3.1 mmol/L — ABNORMAL LOW (ref 3.5–5.1)
Sodium: 137 mmol/L (ref 135–145)
Total Bilirubin: 0.7 mg/dL (ref 0.3–1.2)
Total Protein: 5.4 g/dL — ABNORMAL LOW (ref 6.5–8.1)

## 2022-08-10 LAB — CBC
HCT: 34 % — ABNORMAL LOW (ref 39.0–52.0)
Hemoglobin: 11.3 g/dL — ABNORMAL LOW (ref 13.0–17.0)
MCH: 29.7 pg (ref 26.0–34.0)
MCHC: 33.2 g/dL (ref 30.0–36.0)
MCV: 89.5 fL (ref 80.0–100.0)
Platelets: 221 10*3/uL (ref 150–400)
RBC: 3.8 MIL/uL — ABNORMAL LOW (ref 4.22–5.81)
RDW: 15.7 % — ABNORMAL HIGH (ref 11.5–15.5)
WBC: 10.6 10*3/uL — ABNORMAL HIGH (ref 4.0–10.5)
nRBC: 0 % (ref 0.0–0.2)

## 2022-08-10 LAB — GLUCOSE, CAPILLARY
Glucose-Capillary: 112 mg/dL — ABNORMAL HIGH (ref 70–99)
Glucose-Capillary: 146 mg/dL — ABNORMAL HIGH (ref 70–99)
Glucose-Capillary: 120 mg/dL — ABNORMAL HIGH (ref 70–99)
Glucose-Capillary: 134 mg/dL — ABNORMAL HIGH (ref 70–99)

## 2022-08-10 LAB — HEPARIN LEVEL (UNFRACTIONATED)
Heparin Unfractionated: 0.23 IU/mL — ABNORMAL LOW (ref 0.30–0.70)
Heparin Unfractionated: 0.29 IU/mL — ABNORMAL LOW (ref 0.30–0.70)

## 2022-08-10 MED ORDER — POTASSIUM CHLORIDE CRYS ER 20 MEQ PO TBCR
40.0000 meq | EXTENDED_RELEASE_TABLET | Freq: Once | ORAL | Status: AC
  Administered 2022-08-10: 40 meq via ORAL
  Filled 2022-08-10: qty 2

## 2022-08-10 NOTE — Progress Notes (Signed)
Douglas Hurst. 10:54 AM  Subjective: Patient seen and examined and discussed with my partner Dr. Alessandra Bevels as well as his wife and he is actually doing okay eating some no new symptoms still with some pain working with PT  Objective: Vital signs stable afebrile no acute distress abdomen is soft nontender today labs stable CT reviewed  Assessment: Post ERCP questionable pancreatitis versus symptomatic microscopic perforation  Plan: Consider consulting IR for aspiration of post hepatic fluid otherwise by patient request we will have some soft solids and see how he does and will check on tomorrow  [facility]  office [phone] After 5PM or if no answer call [phone]

## 2022-08-10 NOTE — Progress Notes (Signed)
Progress Note  Patient: Douglas Hurst. ASN:[id number] DOB: 1941/11/06  DOA: 07/30/2022  DOS: 08/10/2022    Brief hospital course: Patient is a 81 y.o.  male with history of PAF on Eliquis, HTN, HLD, COPD, chronic HFrEF (recovered EF)-with recent history of cholecystitis-s/p cholecystectomy but with retained CBD stone.  ERCP here was unsuccessful.  Patient was referred to [facility] underwent advanced endoscopy via transduodenal approach for ERCP on 8/3 at [facility] with Dr. Okey Dupre -he subsequently presented to the ED with severe abdominal pain-was found to have pancreatitis and admitted to the hospitalist service.   Significant events: 8/3>> admit to [facility]-abdominal pain after ERCP at [facility]-found to have acute pancreatitis 8/5>> A-fib RVR-no response to IV Lopressor-blood pressure soft-amiodarone infusion started.  After discussion with GI-heparin started without bolus. 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 8/5>>Repeat urgent CT-AP overnight due to worsening pain without over changes 8/13>> CT shows enlarged perihepatic fluid - likely expanding pseudocyst   Consults: GI  Assessment and Plan: Acute pancreatitis-duodenitis with contained duodenal perforation (transduodenal approach to puncture extrahepatic bile duct to place guidewire into duodenum-subsequently ERCP scope inserted and stone removed):  - CT repeated 8/13 showing fluid collections, enlarging. Leukocytosis is persistent, Tmax 99.67F while on zosyn (started 8/6), was also on meropenem 8/3-8/5.  - GI following, planning to advance diet per pt request.    PAF with  RVR:  Provoked by pancreatitis/pain/acute illness- - Controlled on PO amiodarone (off gtt) - Heparin ongoing - transition to eliquis at DC - Remains in NSR.   Ambulatory dysfunction Multifactorial - follow PT/OT recs, pursue CIR.   AKI, resolved Complicated by BPH and lower urinary tract obstruction  Resolved-likely hemodynamically mediated. Resume flomax - symptoms improving somewhat already; not back to baseline   Chronic HFrEF with recovered EF: Some mild shortness of breath-but also anxious-no obvious signs of volume overload.  - Judicious IVF, currently off while we are initiating diet.    DM-2: CBGs stable with SSI.  Resume metformin when closer to discharge.   HTN: BP soft-continue to hold all antihypertensives.   COPD: Continue bronchodilators   Infrarenal abdominal aortic aneurysm 4 mm: Unchanged from prior study-annual follow-up is required per protocol  Hypokalemia: Supplement and monitor    Subjective: Pt with stable upper abdominal pain, he is hungry and would like diet. No vomiting, no bleeding.  Objective: Vitals:   08/10/22 0800 08/10/22 1200 08/10/22 1600 08/10/22 1609  BP: (!) 159/79 (!) 149/80  (!) 165/74  Pulse: 84 83  81  Resp: (!) 21 20  (!) 21  Temp:  98.7 F (37.1 C) 98.7 F (37.1 C)   TempSrc:  Axillary    SpO2: 94% 95%  95%  Weight:      Height:       Gen: 81 y.o. male in no distress Pulm: Nonlabored breathing room air. Clear CV: Regular rate and rhythm. No murmur, rub, or gallop. No JVD, no dependent edema. GI: Abdomen soft, moderately tender in epigastrium and RUQ without rebound or rigidity, non-distended, with normoactive bowel sounds.  Ext: Warm, no deformities Skin: No rashes, lesions or ulcers on visualized skin. Neuro: Alert and oriented. No focal neurological deficits. Psych: Judgement and insight appear fair.  Mood euthymic & affect congruent. Behavior is appropriate.    Data Personally reviewed: CBC: Recent Labs  Lab  08/06/22 0404 08/07/22 0319 08/08/22 0338 08/09/22 0339 08/10/22 0747  WBC 8.1 11.6* 11.8* 11.4* 10.6*  HGB 11.2* 10.9* 10.3* 11.3* 11.3*  HCT 32.6* 31.8* 30.8* 33.9* 34.0*  MCV 87.9 89.3 89.3 89.9 89.5  PLT 138* 132* 150 176 962   Basic Metabolic Panel: Recent Labs  Lab 08/10/22 0747  NA 137  K 3.1*  CL 102  CO2 28  GLUCOSE 113*  BUN 6*  CREATININE 0.88  CALCIUM 8.4*   GFR: Estimated Creatinine Clearance: 76.9 mL/min (by C-G formula based on SCr of 0.88 mg/dL). Liver Function Tests: Recent Labs  Lab 08/10/22 0747  AST 17  ALT 16  ALKPHOS 73  BILITOT 0.7  PROT 5.4*  ALBUMIN 1.8*   No results for input(s): "LIPASE", "AMYLASE" in the last 168 hours. No results for input(s): "AMMONIA" in the last 168 hours. Coagulation Profile: No results for input(s): "INR", "PROTIME" in the last 168 hours. Cardiac Enzymes: No results for input(s): "CKTOTAL", "CKMB", "CKMBINDEX", "TROPONINI" in the last 168 hours. BNP (last 3 results) No results for input(s): "PROBNP" in the last 8760 hours. HbA1C: No results for input(s): "HGBA1C" in the last 72 hours. CBG: Recent Labs  Lab 08/09/22 1624 08/09/22 2112 08/10/22 0746 08/10/22 1204 08/10/22 1620  GLUCAP 116* 127* 112* 146* 120*   Lipid Profile: No results for input(s): "CHOL", "HDL", "LDLCALC", "TRIG", "CHOLHDL", "LDLDIRECT" in the last 72 hours. Thyroid Function Tests: No results for input(s): "TSH", "T4TOTAL", "FREET4", "T3FREE", "THYROIDAB" in the last 72 hours. Anemia Panel: No results for input(s): "VITAMINB12", "FOLATE", "FERRITIN", "TIBC", "IRON", "RETICCTPCT" in the last 72 hours. Urine analysis:    Component Value Date/Time   COLORURINE YELLOW 04/25/2022 1232   APPEARANCEUR CLEAR 04/25/2022 1232   LABSPEC <1.005 (L) 04/25/2022 1232   PHURINE 6.0 04/25/2022 1232   GLUCOSEU 100 (A) 04/25/2022 1232   HGBUR TRACE (A) 04/25/2022 1232   BILIRUBINUR NEGATIVE 04/25/2022 [place] 04/25/2022 1232    PROTEINUR NEGATIVE 04/25/2022 1232   UROBILINOGEN 4.0 (H) 10/02/2015 2135   NITRITE POSITIVE (A) 04/25/2022 1232   LEUKOCYTESUR SMALL (A) 04/25/2022 1232   No results found for this or any previous visit (from the past 240 hour(s)).   CT ABDOMEN W CONTRAST  Result Date: 08/09/2022 CLINICAL DATA:  Follow-up acute pancreatitis. EXAM: CT ABDOMEN WITH CONTRAST TECHNIQUE: Multidetector CT imaging of the abdomen was performed using the standard protocol following bolus administration of intravenous contrast. RADIATION DOSE REDUCTION: This exam was performed according to the departmental dose-optimization program which includes automated exposure control, adjustment of the mA and/or kV according to patient size and/or use of iterative reconstruction technique. CONTRAST:  13m OMNIPAQUE IOHEXOL 300 MG/ML  SOLN COMPARISON:  08/02/2022 FINDINGS: Lower chest: Moderate bilateral pleural effusions and bilateral lower lobe atelectasis have increased since prior exam. Hepatobiliary: No hepatic masses identified. Prior cholecystectomy. No evidence of biliary obstruction. Increased size of large multiloculated fluid collections with peripheral rim enhancement are seen in the right and left perihepatic spaces, consistent with enlarging pseudocysts. Pancreas: Inflammatory changes are again seen surrounding the pancreatic head, with increased adjacent fluid collection showing peripheral rim enhancement which measures 5.5 x 5.2 cm on image 38/3. This is consistent with a developing pseudocyst. A few adjacent extraluminal air bubbles are again noted. No evidence of free intraperitoneal air. Spleen:  Within normal limits in size and appearance. Adrenals/Urinary Tract: No masses  identified. Stable small right renal cyst (no followup imaging recommended) . No evidence of hydronephrosis. Stomach/Bowel: Unremarkable. Vascular/Lymphatic: No pathologically enlarged lymph nodes identified. Stable 3.9 cm infrarenal abdominal aortic  aneurysm. Other:  None. Musculoskeletal:  No suspicious bone lesions identified. 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 Tiny extraluminal air bubbles are again noted adjacent to the pancreatic head and duodenum. Increased moderate bilateral pleural effusions and bilateral lower lobe atelectasis. Stable 3.9 cm infrarenal abdominal aortic aneurysm. Recommend follow-up every 2 years. This recommendation follows ACR consensus guidelines: White Paper of the ACR Incidental Findings Committee II on Vascular Findings. J Am Coll Radiol 2013; 10:789-794. Electronically Signed   By: Marlaine Hind M.D.   On: 08/09/2022 14:39    Family Communication: None at bedside  Disposition: Status is: Inpatient Remains inpatient appropriate because: Persistent pancreatitis Planned Discharge Destination: CIR  Patrecia Pour, MD 08/10/2022 7:04 PM Page by [email or web address]

## 2022-08-10 NOTE — Progress Notes (Addendum)
ANTICOAGULATION CONSULT NOTE - Follow Up Consult  Pharmacy Consult for Heparin Indication: atrial fibrillation  No Known Allergies  Patient Measurements: Height: '6\' 1"'$  (185.4 cm) Weight: 88 kg (194 lb 0.1 oz) IBW/kg (Calculated) : 79.9 kg  Heparin Dosing Weight: 80.7 kg  Vital Signs: Temp: 98.7 F (37.1 C) (08/14 0741) Temp Source: Axillary (08/14 0741) BP: 160/68 (08/14 0318) Pulse Rate: 86 (08/14 0318)  Labs: Recent Labs    08/08/22 0338 08/09/22 0339 08/10/22 0747  HGB 10.3* 11.3* 11.3*  HCT 30.8* 33.9* 34.0*  PLT 150 176 221  HEPARINUNFRC 0.35 0.35 0.23*     Estimated Creatinine Clearance: 84.6 mL/min (by C-G formula based on SCr of 0.79 mg/dL).  Assessment: 81 y/o male whose pertinent medication history includes Afib and CHF. Pharmacy has been consulted for IV heparin dosing for Afib, no bolus per MD. PTA Eliquis is on hold. His last dose of Eliquis was on 07/26/22 PTA.  Heparin level today 8/14 is down to 0.23, subtherapeutic on 1250 units/hr. Hgb improved,  plt normal on admit, trended down, now increasing and back to normal .   No issues with heparin infusion and no bleeding per RN.  Goal of Therapy:  Heparin level 0.3-0.7 units/ml Monitor platelets by anticoagulation protocol: Yes   Plan:  Increase heparin rate to 1400 units/hr. Check 8 hour heparin level  ~ 1800 tonight. Daily CBC, heparin level. Monitor for signs/symptoms of bleeding.   Nicole Cella, RPh Clinical Pharmacist [phone] 08/10/2022 9:55 AM Please check AMION for all [place] phone numbers After 10:00 PM, call [place] [phone]

## 2022-08-10 NOTE — Progress Notes (Signed)
ANTICOAGULATION CONSULT NOTE - Follow Up Consult  Pharmacy Consult for Heparin Indication: atrial fibrillation  No Known Allergies  Patient Measurements: Height: '6\' 1"'$  (185.4 cm) Weight: 88 kg (194 lb 0.1 oz) IBW/kg (Calculated) : 79.9 Heparin Dosing Weight: 80 kg  Vital Signs: Temp: 98.6 F (37 C) (08/14 1927) Temp Source: Oral (08/14 1927) BP: 152/75 (08/14 1927) Pulse Rate: 79 (08/14 1927)  Labs: Recent Labs    08/08/22 0338 08/09/22 0339 08/10/22 0747 08/10/22 1839  HGB 10.3* 11.3* 11.3*  --   HCT 30.8* 33.9* 34.0*  --   PLT 150 176 221  --   HEPARINUNFRC 0.35 0.35 0.23* 0.29*  CREATININE  --   --  0.88  --      Estimated Creatinine Clearance: 76.9 mL/min (by C-G formula based on SCr of 0.88 mg/dL).  Assessment: 81 y/o male whose pertinent medication history includes Afib and CHF. Pharmacy has been consulted for IV heparin dosing, no bolus per MD. PTA Eliquis is on hold. His last dose of Eliquis was on 07/26/22 PTA.   Heparin level was low this morning (0.23) on 1250 units/hr and infusion rate was increased to 1400 units/hr. Level is now just below target range (0.29).  Hgb stable. Platelet count normal on admit, trended down for several days, now back to normal. No bleeding reported.  Goal of Therapy:  Heparin level 0.3-0.7 units/ml Monitor platelets by anticoagulation protocol: Yes   Plan:  Increase heparin drip to 1500 units/hr Next heparin level and CBC in am. Eliquis on hold.  Arty Baumgartner, RPh 08/10/2022,9:29 PM

## 2022-08-11 ENCOUNTER — Inpatient Hospital Stay (HOSPITAL_COMMUNITY): Payer: Medicare HMO

## 2022-08-11 DIAGNOSIS — K858 Other acute pancreatitis without necrosis or infection: Secondary | ICD-10-CM | POA: Diagnosis not present

## 2022-08-11 LAB — BASIC METABOLIC PANEL
Anion gap: 5 (ref 5–15)
BUN: 7 mg/dL — ABNORMAL LOW (ref 8–23)
CO2: 29 mmol/L (ref 22–32)
Calcium: 8.7 mg/dL — ABNORMAL LOW (ref 8.9–10.3)
Chloride: 102 mmol/L (ref 98–111)
Creatinine, Ser: 0.82 mg/dL (ref 0.61–1.24)
GFR, Estimated: >60 mL/min (ref >60)
Glucose, Bld: 138 mg/dL — ABNORMAL HIGH (ref 70–99)
Potassium: 3.4 mmol/L — ABNORMAL LOW (ref 3.5–5.1)
Sodium: 136 mmol/L (ref 135–145)

## 2022-08-11 LAB — CBC
HCT: 32 % — ABNORMAL LOW (ref 39.0–52.0)
Hemoglobin: 11 g/dL — ABNORMAL LOW (ref 13.0–17.0)
MCH: 30.3 pg (ref 26.0–34.0)
MCHC: 34.4 g/dL (ref 30.0–36.0)
MCV: 88.2 fL (ref 80.0–100.0)
Platelets: 228 10*3/uL (ref 150–400)
RBC: 3.63 MIL/uL — ABNORMAL LOW (ref 4.22–5.81)
RDW: 15.6 % — ABNORMAL HIGH (ref 11.5–15.5)
WBC: 10.5 10*3/uL (ref 4.0–10.5)
nRBC: 0 % (ref 0.0–0.2)

## 2022-08-11 LAB — GLUCOSE, CAPILLARY
Glucose-Capillary: 127 mg/dL — ABNORMAL HIGH (ref 70–99)
Glucose-Capillary: 157 mg/dL — ABNORMAL HIGH (ref 70–99)
Glucose-Capillary: 139 mg/dL — ABNORMAL HIGH (ref 70–99)
Glucose-Capillary: 154 mg/dL — ABNORMAL HIGH (ref 70–99)

## 2022-08-11 LAB — PROTIME-INR
INR: 1.1 (ref 0.8–1.2)
Prothrombin Time: 14.3 seconds (ref 11.4–15.2)

## 2022-08-11 LAB — HEPARIN LEVEL (UNFRACTIONATED): Heparin Unfractionated: 0.4 IU/mL (ref 0.30–0.70)

## 2022-08-11 LAB — C-REACTIVE PROTEIN: CRP: 17.3 mg/dL — ABNORMAL HIGH (ref <1.0)

## 2022-08-11 MED ORDER — LIDOCAINE HCL 1 % IJ SOLN
10.0000 mL | Freq: Once | INTRAMUSCULAR | Status: DC
  Filled 2022-08-11: qty 10

## 2022-08-11 MED ORDER — SENNOSIDES-DOCUSATE SODIUM 8.6-50 MG PO TABS
1.0000 | ORAL_TABLET | Freq: Every day | ORAL | Status: DC
  Administered 2022-08-17 – 2022-09-06 (×14): 1 via ORAL
  Filled 2022-08-11 (×22): qty 1

## 2022-08-11 MED ORDER — METOPROLOL TARTRATE 25 MG PO TABS
25.0000 mg | ORAL_TABLET | Freq: Two times a day (BID) | ORAL | Status: DC
  Administered 2022-08-11 – 2022-09-08 (×56): 25 mg via ORAL
  Filled 2022-08-11 (×57): qty 1

## 2022-08-11 MED ORDER — MIDAZOLAM HCL 2 MG/2ML IJ SOLN
INTRAMUSCULAR | Status: AC | PRN
  Administered 2022-08-11: .5 mg via INTRAVENOUS
  Administered 2022-08-11: 1 mg via INTRAVENOUS

## 2022-08-11 MED ORDER — FUROSEMIDE 10 MG/ML IJ SOLN
20.0000 mg | Freq: Once | INTRAMUSCULAR | Status: AC
  Administered 2022-08-11: 20 mg via INTRAVENOUS
  Filled 2022-08-11: qty 2

## 2022-08-11 MED ORDER — POTASSIUM CHLORIDE CRYS ER 20 MEQ PO TBCR
40.0000 meq | EXTENDED_RELEASE_TABLET | Freq: Once | ORAL | Status: AC
  Administered 2022-08-11: 40 meq via ORAL
  Filled 2022-08-11: qty 2

## 2022-08-11 MED ORDER — FENTANYL CITRATE (PF) 100 MCG/2ML IJ SOLN
INTRAMUSCULAR | Status: AC
  Filled 2022-08-11: qty 2

## 2022-08-11 MED ORDER — MIDAZOLAM HCL 2 MG/2ML IJ SOLN
INTRAMUSCULAR | Status: AC
  Filled 2022-08-11: qty 2

## 2022-08-11 MED ORDER — FENTANYL CITRATE (PF) 100 MCG/2ML IJ SOLN
INTRAMUSCULAR | Status: AC | PRN
  Administered 2022-08-11 (×2): 25 ug via INTRAVENOUS

## 2022-08-11 MED ORDER — POLYETHYLENE GLYCOL 3350 17 G PO PACK
17.0000 g | PACK | Freq: Every day | ORAL | Status: DC
  Administered 2022-08-17 – 2022-08-24 (×4): 17 g via ORAL
  Filled 2022-08-11 (×6): qty 1

## 2022-08-11 NOTE — Procedures (Signed)
Interventional Radiology Procedure Note  Procedure: CT guided abdominal drain placement  Findings: Please refer to procedural dictation for full description. 10.2 Fr right hepatic subcapsular fluid drain placed. Approximately 200 mL dark brown/maroon translucent fluid aspirated.  Sample sent for culture, bilirubin.  Drain placed to bag drainage.  Complications: None immediate  Estimated Blood Loss: < 5 mL  Recommendations: Keep to bag drainage. Follow up fluid studies. IR will follow.   Ruthann Cancer, MD Pager: [phone]

## 2022-08-11 NOTE — Progress Notes (Signed)
Inpatient Rehab Admissions Coordinator:   Met with pt and wife at bedside to update.  Per chart review it seems plan for IR intervention today and continued observation for improvement of abdominal pain and tolerance of diet.  We will continue to follow.   Shann Medal, PT, DPT Admissions Coordinator [phone] 08/11/22  2:59 PM

## 2022-08-11 NOTE — Progress Notes (Signed)
Progress Note  Patient: Douglas Hurst. EXH:[id number] DOB: 1941/06/19  DOA: 07/30/2022  DOS: 08/11/2022    Brief hospital course: Douglas Hurst. is a 81 y.o. male with a history of HFw/recoveredEF, PAF, NIDT2DM, HTN, HLD, GERD, COPD, and acute calculous cholecystitis and choledocholithiasis admitted here 4/29, discharged with cholecystostomy tube and discharged. He had AFib w/RVR and CHF during that admit, but with rate and rhythm control, EF normalized. He had unsuccessful ERCP and subsequent laparoscopic cholecystectomy 7/23 with inability to clear the bile duct. Referred for ERCP at [facility]. [facility] ERCP was performed 8/3 and he was discharged home only to return to [facility] with abdominal pain and post-ERCP pancreatitis with imaging concerning for necrosis. Also had difficulty urinating. IVF, IV antibiotics were started, and GI consulted on admission. Hospitalization complicated by AFib w/RVR which has since converted to NSR. Urinary retention improved on higher dose tamsulosin. He failed advancing diet on 8/10, repeat CT showed enlarging pseudocysts. GI recommends IR evaluation.    Assessment and Plan: Acute pancreatitis-duodenitis with contained duodenal perforation (s/p rendezvous ERCP; transduodenal approach to puncture extrahepatic bile duct to place guidewire into duodenum-subsequently ERCP scope inserted and stone removed):  - CT repeated 8/13 showing fluid collections, enlarging. Leukocytosis is persistent, Tmax 99.39F while on zosyn (started 8/6), was also on meropenem 8/3-8/5. Will seek IR recommendations Re: aspiration. CRP grossly elevated at 17.3.  - GI following, advanced diet 8/14. Has received hydrocodone and IV dilaudid very regularly since that time. D/w pt that we will need to pursue alternatives (e.g. post-pyloric feeding vs. TPN).   PAF with RVR: RVR precipitated by acute pancreatitis.  - Remains in NSR on amiodarone. BP rising, will restart home metoprolol as well.  -  Continue heparin gtt, plan to transition to DOAC once durably tolerating PO and no procedure planned.   Acute hypoxic respiratory failure: Due to pleural effusions, atelectasis based on personal review of CT abd 8/13.  - Stopped IVF. Will check CXR. Pancreatitis patients at high risk of infections.  - Wean O2 as tolerated.  - Consider lasix.  - Incentive spirometry.   Ambulatory dysfunction: Pt very weakened/deconditioned. - Ordered OOB qShift if possible, continue PT/OT. Current recommendation has been for CIR.    AKI: Prerenal and suspected postrenal. Resolved.  - SCr is wnl. Monitor volume status  Acute urinary retention, BPH: Resolved retention.    - Continue tamsulosin 0.'8mg'$  daily  Constipation: Resolved.  - Deescalate bowel regimen to miralax and senna once daily.   Chronic HFrEF with recovered EF: LVEF 60-65%, G1DD, normal RV and PASP on echo May 2023.  - Judicious IVF, currently off while we are initiating diet.    NIDT2DM: Well controlled chronically with HbA1c 5.6% July 2023.  - Continue SSI, remains at inpatient goal.    HTN: BP soft-continue to hold all antihypertensives.   COPD: No wheezing.  - Continue bronchodilators   Infrarenal abdominal aortic aneurysm 4 mm: Unchanged from prior study-annual follow-up is required per protocol  Hypokalemia: Improved.  - Repeat supplement, monitor. With recent AFib, goal would be 4.0    Subjective: Pain in lower chest, upper abdomen is stable, waxing/waning. He doesn't want any of the food he's been offered, but knows he needs to eat. He's not certain about whether food makes pain worse. Has received dilaudid IV x3 and hydrocodone PO x2 in past 24 hours. No nausea or vomiting.  Objective: Vitals:   08/10/22 1927 08/10/22 2341 08/11/22 0316 08/11/22 0500  BP: (!) 152/75 Marland Kitchen)  151/75 (!) 158/83   Pulse: 79 83 80   Resp: (!) 22 (!) 21 20   Temp: 98.6 F (37 C) 97.9 F (36.6 C) 97.8 F (36.6 C)   TempSrc: Oral Oral Oral    SpO2: 94% 91% 93%   Weight:    85.5 kg  Height:       Gen: 81 y.o. male in no distress Pulm: Nonlabored breathing supplemental oxygen, very diminished at bases with some crackles, no wheezes. CV: Regular rate and rhythm. No murmur, rub, or gallop. No JVD, no dependent edema. GI: Abdomen soft, tender in epigastrium without rebound. Non-distended, with normoactive bowel sounds.  Ext: Warm, no deformities Skin: No rashes, lesions or ulcers on visualized skin. Neuro: Alert and oriented. No focal neurological deficits. Psych: Judgement and insight appear fair. Mood euthymic & affect congruent. Behavior is appropriate.    Data Personally reviewed: CBC: Recent Labs  Lab 08/07/22 0319 08/08/22 0338 08/09/22 0339 08/10/22 0747 08/11/22 0448  WBC 11.6* 11.8* 11.4* 10.6* 10.5  HGB 10.9* 10.3* 11.3* 11.3* 11.0*  HCT 31.8* 30.8* 33.9* 34.0* 32.0*  MCV 89.3 89.3 89.9 89.5 88.2  PLT 132* 150 176 221 045   Basic Metabolic Panel: Recent Labs  Lab 08/10/22 0747 08/11/22 0448  NA 137 136  K 3.1* 3.4*  CL 102 102  CO2 28 29  GLUCOSE 113* 138*  BUN 6* 7*  CREATININE 0.88 0.82  CALCIUM 8.4* 8.7*   GFR: Estimated Creatinine Clearance: 82.6 mL/min (by C-G formula based on SCr of 0.82 mg/dL). Liver Function Tests: Recent Labs  Lab 08/10/22 0747  AST 17  ALT 16  ALKPHOS 73  BILITOT 0.7  PROT 5.4*  ALBUMIN 1.8*   No results for input(s): "LIPASE", "AMYLASE" in the last 168 hours. No results for input(s): "AMMONIA" in the last 168 hours. Coagulation Profile: No results for input(s): "INR", "PROTIME" in the last 168 hours. Cardiac Enzymes: No results for input(s): "CKTOTAL", "CKMB", "CKMBINDEX", "TROPONINI" in the last 168 hours. BNP (last 3 results) No results for input(s): "PROBNP" in the last 8760 hours. HbA1C: No results for input(s): "HGBA1C" in the last 72 hours. CBG: Recent Labs  Lab 08/09/22 2112 08/10/22 0746 08/10/22 1204 08/10/22 1620 08/10/22 2125  GLUCAP  127* 112* 146* 120* 134*   Lipid Profile: No results for input(s): "CHOL", "HDL", "LDLCALC", "TRIG", "CHOLHDL", "LDLDIRECT" in the last 72 hours. Thyroid Function Tests: No results for input(s): "TSH", "T4TOTAL", "FREET4", "T3FREE", "THYROIDAB" in the last 72 hours. Anemia Panel: No results for input(s): "VITAMINB12", "FOLATE", "FERRITIN", "TIBC", "IRON", "RETICCTPCT" in the last 72 hours. Urine analysis:    Component Value Date/Time   COLORURINE YELLOW 04/25/2022 1232   APPEARANCEUR CLEAR 04/25/2022 1232   LABSPEC <1.005 (L) 04/25/2022 1232   PHURINE 6.0 04/25/2022 1232   GLUCOSEU 100 (A) 04/25/2022 1232   HGBUR TRACE (A) 04/25/2022 1232   BILIRUBINUR NEGATIVE 04/25/2022 [place] 04/25/2022 1232   PROTEINUR NEGATIVE 04/25/2022 1232   UROBILINOGEN 4.0 (H) 10/02/2015 2135   NITRITE POSITIVE (A) 04/25/2022 1232   LEUKOCYTESUR SMALL (A) 04/25/2022 1232   No results found for this or any previous visit (from the past 240 hour(s)).   CT ABDOMEN W CONTRAST  Result Date: 08/09/2022 CLINICAL DATA:  Follow-up acute pancreatitis. EXAM: CT ABDOMEN WITH CONTRAST TECHNIQUE: Multidetector CT imaging of the abdomen was performed using the standard protocol following bolus administration of intravenous contrast. RADIATION DOSE REDUCTION: This exam was performed according to the departmental dose-optimization program which includes  automated exposure control, adjustment of the mA and/or kV according to patient size and/or use of iterative reconstruction technique. CONTRAST:  74m OMNIPAQUE IOHEXOL 300 MG/ML  SOLN COMPARISON:  08/02/2022 FINDINGS: Lower chest: Moderate bilateral pleural effusions and bilateral lower lobe atelectasis have increased since prior exam. Hepatobiliary: No hepatic masses identified. Prior cholecystectomy. No evidence of biliary obstruction. Increased size of large multiloculated fluid collections with peripheral rim enhancement are seen in the right and left  perihepatic spaces, consistent with enlarging pseudocysts. Pancreas: Inflammatory changes are again seen surrounding the pancreatic head, with increased adjacent fluid collection showing peripheral rim enhancement which measures 5.5 x 5.2 cm on image 38/3. This is consistent with a developing pseudocyst. A few adjacent extraluminal air bubbles are again noted. No evidence of free intraperitoneal air. Spleen:  Within normal limits in size and appearance. Adrenals/Urinary Tract: No masses identified. Stable small right renal cyst (no followup imaging recommended) . No evidence of hydronephrosis. Stomach/Bowel: Unremarkable. Vascular/Lymphatic: No pathologically enlarged lymph nodes identified. Stable 3.9 cm infrarenal abdominal aortic aneurysm. Other:  None. Musculoskeletal:  No suspicious bone lesions identified. 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 Tiny extraluminal air bubbles are again noted adjacent to the pancreatic head and duodenum. Increased moderate bilateral pleural effusions and bilateral lower lobe atelectasis. Stable 3.9 cm infrarenal abdominal aortic aneurysm. Recommend follow-up every 2 years. This recommendation follows ACR consensus guidelines: White Paper of the ACR Incidental Findings Committee II on Vascular Findings. J Am Coll Radiol 2013; 10:789-794. Electronically Signed   By: JMarlaine HindM.D.   On: 08/09/2022 14:39    Family Communication: None at bedside  Disposition: Status is: Inpatient Remains inpatient appropriate because: Persistent pancreatitis Planned Discharge Destination: CIR  RPatrecia Pour MD 08/11/2022 8:49 AM Page by aShea [email or web address]

## 2022-08-11 NOTE — Progress Notes (Signed)
PT Cancellation Note  Patient Details Name: Douglas Hurst. MRN: [id number] DOB: 1941/08/16   Cancelled Treatment:    Reason Eval/Treat Not Completed: (P) Patient at procedure or test/unavailable (pt working with OT then off the floor for procedure when reattempted.) Will continue efforts per PT plan of care as schedule permits.   [name] Douglas Hurst 08/11/2022, 5:14 PM

## 2022-08-11 NOTE — Progress Notes (Signed)
Occupational Therapy Treatment Patient Details Name: Douglas Hurst. MRN: [id number] DOB: [birth year] Today's Date: 08/11/2022   History of present illness Patient is a 81 yo male presenting to the ED with severe abdominal pain with radiation to right shoulder on 07/31/22. Patient had underwent ECRP for removal of stones from biliary and pancreatic duct on 8/3. Admitted with acute pancreatitis. PMH includes: bipolar d/o DMII, HTN, HLD, GERD, COPD ,  Afib ,diastolic heart failure, AAA with interim history of acute cholecystitis   OT comments  Pt currently at mod assist for supine to sit from a flat bed and then min assist for sit to stand from slightly elevated bed with use of the RW for support.  Decreased overall endurance noted during toileting tasks with standing initially at 2-3 mins decreasing to less than 1 min after a couple intervals.  Oxygen sats decreasing down into the upper 80s on 2Ls nasal cannula with activity.  Recommend continued acute care OT to address these deficits with recommendation for more intense rehab at AIR level to reach supervision level goals for home.     Recommendations for follow up therapy are one component of a multi-disciplinary discharge planning process, led by the attending physician.  Recommendations may be updated based on patient status, additional functional criteria and insurance authorization.    Follow Up Recommendations  Acute inpatient rehab (3hours/day)    Assistance Recommended at Discharge Frequent or constant Supervision/Assistance  Patient can return home with the following  A little help with walking and/or transfers;A little help with bathing/dressing/bathroom;Assistance with cooking/housework;Assist for transportation;Help with stairs or ramp for entrance;Direct supervision/assist for medications management   Equipment Recommendations  Other (comment) (TBD next venue of care)       Precautions / Restrictions Precautions Precautions:  Fall Restrictions Weight Bearing Restrictions: No       Mobility Bed Mobility Overal bed mobility: Needs Assistance Bed Mobility: Supine to Sit     Supine to sit: Mod assist     General bed mobility comments: Assist for lifting trunk up to sitting    Transfers Overall transfer level: Needs assistance Equipment used: Rolling walker (2 wheels) Transfers: Sit to/from Stand Sit to Stand: Min assist     Step pivot transfers: Min assist     General transfer comment: Mod instructional cueing for pushing up from the surface he is sitting on and then reaching back to sit down.     Balance Overall balance assessment: Needs assistance Sitting-balance support: Feet supported, Bilateral upper extremity supported Sitting balance-Leahy Scale: Fair     Standing balance support: Bilateral upper extremity supported, Reliant on assistive device for balance Standing balance-Leahy Scale: Poor Standing balance comment: Pt needs BUE support for balance in standing.                           ADL either performed or assessed with clinical judgement   ADL Overall ADL's : Needs assistance/impaired                     Lower Body Dressing: Moderate assistance Lower Body Dressing Details (indicate cue type and reason): for donning gripper socks Toilet Transfer: Minimal assistance;Stand-pivot;Rolling walker (2 wheels) Toilet Transfer Details (indicate cue type and reason): simulated secondary to pt declining need to toilet Toileting- Clothing Manipulation and Hygiene: Moderate assistance;Sit to/from stand       Functional mobility during ADLs: Minimal assistance;Rolling walker (2 wheels) General ADL Comments: Pt's O2  sats at 87-91 % on 2Ls nasal cannula.  They decreased down to 88% in sitting on room air with a need for supplemental O2 to increase back up to 90%.  Standing endurance statcally at 2-3 mins initially but decreasing down to around 1 min after standing and  working on washing buttocks.               Cognition Arousal/Alertness: Awake/alert Behavior During Therapy: Flat affect Overall Cognitive Status: Within Functional Limits for tasks assessed                                 General Comments: following 1-step commands well, increased time.                   Pertinent Vitals/ Pain       Pain Assessment Pain Assessment: Faces Pain Score: 0-No pain         Frequency  Min 3X/week        Progress Toward Goals  OT Goals(current goals can now be found in the care plan section)  Progress towards OT goals: Progressing toward goals  Acute Rehab OT Goals Patient Stated Goal: To go to rehab OT Goal Formulation: With patient Time For Goal Achievement: 08/17/22 Potential to Achieve Goals: Good  Plan Discharge plan remains appropriate       AM-PAC OT "6 Clicks" Daily Activity     Outcome Measure   Help from another person eating meals?: None Help from another person taking care of personal grooming?: A Little Help from another person toileting, which includes using toliet, bedpan, or urinal?: A Lot Help from another person bathing (including washing, rinsing, drying)?: A Lot Help from another person to put on and taking off regular upper body clothing?: None Help from another person to put on and taking off regular lower body clothing?: A Lot 6 Click Score: 17    End of Session Equipment Utilized During Treatment: Gait belt;Rolling walker (2 wheels)  OT Visit Diagnosis: Unsteadiness on feet (R26.81);Muscle weakness (generalized) (M62.81)   Activity Tolerance Patient tolerated treatment well   Patient Left in chair;with call bell/phone within reach;with chair alarm set   Nurse Communication Mobility status        Time: 6222-9798 OT Time Calculation (min): 41 min  Charges: OT General Charges $OT Visit: 1 Visit OT Treatments $Self Care/Home Management : 38-52 mins  Idamae Coccia  OTR/L 08/11/2022, 1:34 PM

## 2022-08-11 NOTE — Progress Notes (Signed)
ANTICOAGULATION CONSULT NOTE - Follow Up Consult  Pharmacy Consult for Heparin Indication: atrial fibrillation  No Known Allergies  Patient Measurements: Height: '6\' 1"'$  (185.4 cm) Weight: 85.5 kg (188 lb 7.9 oz) IBW/kg (Calculated) : 79.9 Heparin Dosing Weight: 80 kg  Vital Signs: Temp: 97.8 F (36.6 C) (08/15 0316) Temp Source: Oral (08/15 0316) BP: 149/70 (08/15 0851) Pulse Rate: 87 (08/15 0851)  Labs: Recent Labs    08/09/22 0339 08/10/22 0747 08/10/22 1839 08/11/22 0448 08/11/22 0941  HGB 11.3* 11.3*  --  11.0*  --   HCT 33.9* 34.0*  --  32.0*  --   PLT 176 221  --  228  --   LABPROT  --   --   --   --  14.3  INR  --   --   --   --  1.1  HEPARINUNFRC 0.35 0.23* 0.29* 0.40  --   CREATININE  --  0.88  --  0.82  --      Estimated Creatinine Clearance: 82.6 mL/min (by C-G formula based on SCr of 0.82 mg/dL).  Assessment: 81 y/o male whose pertinent medication history includes Afib and CHF. Pharmacy has been consulted for IV heparin dosing, no bolus per MD. PTA Eliquis is on hold. His last dose of Eliquis was on 07/26/22 PTA.  Heparin level 0.40 therapeutic after Heparin infusion rate increased to 1500 units/hr.   Hgb stable. Platelet count  within normal.  No bleeding reported. MD plans to transition back to Eliquis once durably tolerating PO and no procedure planned.   Goal of Therapy:  Heparin level 0.3-0.7 units/ml Monitor platelets by anticoagulation protocol: Yes   Plan:  Continue IV heparin drip to 1500 units/hr Daily HL and CBC Eliquis on hold.  Nicole Cella, RPh Clinical Pharmacist [phone] 08/11/2022,11:14 AM Please check AMION for all [facility] phone numbers After 10:00 PM, call [place] [phone]

## 2022-08-11 NOTE — Consult Note (Signed)
Chief Complaint: Patient was seen in consultation today for post hepatic fluid collection drain placement Chief Complaint  Patient presents with   Abdominal Pain   at the request of Dr Mathews Robinsons  Referring Physician(s): Dr Cleaster Corin  Supervising Physician: Ruthann Cancer  Patient Status: [facility] - [facility]  History of Present Illness: Douglas Merrick. is a 81 y.o. male   Hx AF -- on eliquis--- now only on IV Heparin HTN; HLD; COPD; CHF Cholecystitis post cholecystectomy- but retained CBD stones Unsuccessful ERCP- to [facility] for endoscopy 07/30/22 Presented to ED [facility] same day severe abd pain- found to have pancreatitis  CT 8/13: IMPRESSION: Increased size of large perihepatic pseudocysts and developing pseudocyst adjacent to the pancreatic head. Tiny extraluminal air bubbles are again noted adjacent to the pancreatic head and duodenum. Increased moderate bilateral pleural effusions and bilateral lower lobe atelectasis.  Dr Watt Climes note 8/14:  Assessment: Post ERCP questionable pancreatitis versus symptomatic microscopic perforation Plan: Consider consulting IR for aspiration of post hepatic fluid otherwise by patient request we will have some soft solids and see how he does and will check on tomorrow  Request made for IR to aspirate/drain post perihepatic fluid collection Dr Serafina Royals has reviewed imaging and approves procedure  Past Medical History:  Diagnosis Date   Bipolar disorder ([place])    Chronic back pain    Complication of anesthesia    prolonged sedation and confusion   COPD (chronic obstructive pulmonary disease) ([place])    Dr. Laurann Montana   Diabetes mellitus without complication ([place])    type 2   Diverticulosis    Dysrhythmia    Afib  onset 03-2022   ED (erectile dysfunction)    Gallstones    GERD (gastroesophageal reflux disease)    Hiatal hernia    Hyperlipidemia    Hypertension    Wears dentures    upper   Wears glasses    Wears hearing aid in both  ears     Past Surgical History:  Procedure Laterality Date   BALLOON DILATION N/A 10/01/2015   Procedure: BALLOON DILATION;  Surgeon: Garlan Fair, MD;  Location: [facility] ENDOSCOPY;  Service: Endoscopy;  Laterality: N/A;   CATARACT EXTRACTION, BILATERAL     CHOLECYSTECTOMY N/A 07/08/2022   Procedure: LAPAROSCOPIC CHOLECYSTECTOMY WITH INTRAOPERATIVE CHOLANGIOGRAM AND LAPAROSCOPIC COMMON DUCT EXPLORATION;  Surgeon: Felicie Morn, MD;  Location: [facility] ORS;  Service: General;  Laterality: N/A;   COLONOSCOPY     removed polyps   COLONOSCOPY WITH PROPOFOL N/A 09/01/2016   Procedure: COLONOSCOPY WITH PROPOFOL;  Surgeon: Garlan Fair, MD;  Location: [facility] ENDOS[facility];  Service: Endoscopy;  Laterality: N/A;   ERCP N/A 07/09/2022   Procedure: ENDOSCOPIC RETROGRADE CHOLANGIOPANCREATOGRAPHY (ERCP);  Surgeon: Clarene Essex, MD;  Location: [facility];  Service: Gastroenterology;  Laterality: N/A;   ESOPHAGEAL DILATION  06/23/2022   Procedure: PYLORIC  DILATION;  Surgeon: Clarene Essex, MD;  Location: [facility];  Service: Gastroenterology;;   ESOPHAGOGASTRODUODENOSCOPY N/A 06/23/2022   Procedure: ESOPHAGOGASTRODUODENOSCOPY (EGD);  Surgeon: Clarene Essex, MD;  Location: [facility];  Service: Gastroenterology;  Laterality: N/A;   ESOPHAGOGASTRODUODENOSCOPY (EGD) WITH PROPOFOL N/A 10/01/2015   Procedure: ESOPHAGOGASTRODUODENOSCOPY (EGD) WITH PROPOFOL;  Surgeon: Garlan Fair, MD;  Location: [facility];  Service: Endoscopy;  Laterality: N/A;   FISTULOTOMY  07/09/2022   Procedure: FISTULOTOMY;  Surgeon: Clarene Essex, MD;  Location: [facility];  Service: Gastroenterology;;   HEMOSTASIS CLIP PLACEMENT  06/23/2022   Procedure: HEMOSTASIS CLIP PLACEMENT;  Surgeon: Watt Climes,  Altamese Dilling, MD;  Location: [facility];  Service: Gastroenterology;;   IR EXCHANGE BILIARY DRAIN  05/15/2022   IR EXCHANGE BILIARY DRAIN  05/26/2022   IR EXCHANGE BILIARY DRAIN  06/11/2022   IR EXCHANGE BILIARY DRAIN  06/23/2022   IR PERC  CHOLECYSTOSTOMY  04/26/2022   KNEE ARTHROSCOPY     TONSILLECTOMY      Allergies: Patient has no known allergies.  Medications: Prior to Admission medications   Medication Sig Start Date End Date Taking? Authorizing Provider  acetaminophen (TYLENOL) 325 MG tablet Take 2 tablets (650 mg total) by mouth every 6 (six) hours as needed for mild pain (or Fever >/= 101). 04/30/22  Yes Sheikh, Omair Latif, DO  amiodarone (PACERONE) 200 MG tablet Take 1 tablet (200 mg total) by mouth daily. 05/29/22  Yes Loel Dubonnet, NP  atorvastatin (LIPITOR) 40 MG tablet Take 40 mg by mouth every evening.   Yes [provider]  lisinopril-hydrochlorothiazide (ZESTORETIC) 10-12.5 MG tablet Take 0.5 tablets by mouth daily.   Yes [provider]  metFORMIN (GLUCOPHAGE-XR) 500 MG 24 hr tablet Take 500-1,000 mg by mouth See admin instructions. Take 2 tablets (1000 mg) by mouth in the morning and 1 tablet (500 mg) every evening 03/17/22  Yes [provider]  metoprolol tartrate (LOPRESSOR) 25 MG tablet Take 1 tablet (25 mg total) by mouth 2 (two) times daily. 05/29/22  Yes Loel Dubonnet, NP  Multiple Vitamin (MULTIVITAMIN WITH MINERALS) TABS tablet Take 1 tablet by mouth daily.   Yes [provider]  ondansetron (ZOFRAN) 4 MG tablet Take 1 tablet (4 mg total) by mouth every 6 (six) hours as needed for nausea. 04/30/22  Yes Sheikh, Omair Latif, DO  pantoprazole (PROTONIX) 40 MG tablet Take 40 mg by mouth every morning.   Yes [provider]  polyethylene glycol powder (GLYCOLAX/MIRALAX) 17 GM/SCOOP powder Take 17 g by mouth daily. Patient taking differently: Take 17 g by mouth daily as needed for mild constipation or moderate constipation. 04/30/22  Yes Sheikh, Omair Latif, DO  senna-docusate (SENOKOT-S) 8.6-50 MG tablet Take 1 tablet by mouth at bedtime. Patient taking differently: Take 1 tablet by mouth at bedtime as needed for mild constipation or moderate constipation. 04/30/22   Yes Sheikh, Omair Latif, DO  SPIRIVA RESPIMAT 2.5 MCG/ACT AERS Inhale 2 puffs into the lungs daily as needed (COPD). 06/04/22  Yes [provider]  tadalafil (CIALIS) 20 MG tablet Take 20 mg by mouth every three (3) days as needed for erectile dysfunction. 03/29/22  Yes [provider]  tamsulosin (FLOMAX) 0.4 MG CAPS capsule Take 0.4 mg by mouth daily after supper. 05/13/22  Yes [provider]  apixaban (ELIQUIS) 5 MG TABS tablet Take 1 tablet (5 mg total) by mouth 2 (two) times daily. Patient not taking: Reported on 07/31/2022 05/29/22   Loel Dubonnet, NP  oxyCODONE-acetaminophen (PERCOCET) 5-325 MG tablet Take 1 tablet by mouth every 4 (four) hours as needed for severe pain. Patient not taking: Reported on 07/31/2022 07/14/22 07/14/23  Norm Parcel, PA-C     Family History  Problem Relation Age of Onset   Heart attack Mother    Cancer - Colon Mother    CAD Mother    Cancer Mother    Dementia Mother    CAD Father    Cancer Father    CVA Father    Dementia Father    Heart attack Brother    CAD Brother     Social History  Socioeconomic History   Marital status: Married    Spouse name: Not on file   Number of children: Not on file   Years of education: Not on file   Highest education level: Not on file  Occupational History   Not on file  Tobacco Use   Smoking status: Former    Years: 35.00    Types: Cigarettes   Smokeless tobacco: Never  Vaping Use   Vaping Use: Never used  Substance and Sexual Activity   Alcohol use: No   Drug use: No   Sexual activity: Not Currently  Other Topics Concern   Not on file  Social History Narrative   Not on file   Social Determinants of Health   Financial Resource Strain: Not on file  Food Insecurity: Not on file  Transportation Needs: Not on file  Physical Activity: Not on file  Stress: Not on file  Social Connections: Not on file    Review of Systems: A 12 point ROS discussed and pertinent positives  are indicated in the HPI above.  All other systems are negative.  Review of Systems  Constitutional:  Positive for activity change, appetite change, fatigue and unexpected weight change.  Respiratory:  Negative for cough and shortness of breath.   Cardiovascular:  Negative for chest pain.  Gastrointestinal:  Positive for abdominal pain and nausea.  Neurological:  Positive for weakness.  Psychiatric/Behavioral:  Negative for behavioral problems and confusion.     Vital Signs: BP (!) 149/70 (BP Location: Left Arm)   Pulse 87   Temp 97.8 F (36.6 C) (Oral)   Resp (!) 23   Ht '6\' 1"'$  (1.854 m)   Wt 188 lb 7.9 oz (85.5 kg)   SpO2 95%   BMI 24.87 kg/m    Physical Exam Vitals reviewed.  Cardiovascular:     Rate and Rhythm: Normal rate and regular rhythm.  Pulmonary:     Effort: Pulmonary effort is normal.     Breath sounds: Normal breath sounds.  Abdominal:     Palpations: Abdomen is soft.     Tenderness: There is abdominal tenderness.  Skin:    General: Skin is warm.  Neurological:     Mental Status: He is alert and oriented to person, place, and time.  Psychiatric:        Behavior: Behavior normal.     Imaging: CT ABDOMEN W CONTRAST  Result Date: 08/09/2022 CLINICAL DATA:  Follow-up acute pancreatitis. EXAM: CT ABDOMEN WITH CONTRAST TECHNIQUE: Multidetector CT imaging of the abdomen was performed using the standard protocol following bolus administration of intravenous contrast. RADIATION DOSE REDUCTION: This exam was performed according to the departmental dose-optimization program which includes automated exposure control, adjustment of the mA and/or kV according to patient size and/or use of iterative reconstruction technique. CONTRAST:  15m OMNIPAQUE IOHEXOL 300 MG/ML  SOLN COMPARISON:  08/02/2022 FINDINGS: Lower chest: Moderate bilateral pleural effusions and bilateral lower lobe atelectasis have increased since prior exam. Hepatobiliary: No hepatic masses identified.  Prior cholecystectomy. No evidence of biliary obstruction. Increased size of large multiloculated fluid collections with peripheral rim enhancement are seen in the right and left perihepatic spaces, consistent with enlarging pseudocysts. Pancreas: Inflammatory changes are again seen surrounding the pancreatic head, with increased adjacent fluid collection showing peripheral rim enhancement which measures 5.5 x 5.2 cm on image 38/3. This is consistent with a developing pseudocyst. A few adjacent extraluminal air bubbles are again noted. No evidence of free intraperitoneal air. Spleen:  Within normal limits  in size and appearance. Adrenals/Urinary Tract: No masses identified. Stable small right renal cyst (no followup imaging recommended) . No evidence of hydronephrosis. Stomach/Bowel: Unremarkable. Vascular/Lymphatic: No pathologically enlarged lymph nodes identified. Stable 3.9 cm infrarenal abdominal aortic aneurysm. Other:  None. Musculoskeletal:  No suspicious bone lesions identified. 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 Tiny extraluminal air bubbles are again noted adjacent to the pancreatic head and duodenum. Increased moderate bilateral pleural effusions and bilateral lower lobe atelectasis. Stable 3.9 cm infrarenal abdominal aortic aneurysm. Recommend follow-up every 2 years. This recommendation follows ACR consensus guidelines: White Paper of the ACR Incidental Findings Committee II on Vascular Findings. J Am Coll Radiol 2013; 10:789-794. Electronically Signed   By: Marlaine Hind M.D.   On: 08/09/2022 14:39   CT ABDOMEN PELVIS W CONTRAST  Result Date: 08/02/2022 CLINICAL DATA:  Choledocholithiasis status post endoscopic stone extraction complicated by post ERCP pancreatitis. Progressive abdominal pain. EXAM: CT ABDOMEN AND PELVIS WITH CONTRAST TECHNIQUE: Multidetector CT imaging of the abdomen and pelvis was performed using  the standard protocol following bolus administration of intravenous contrast. RADIATION DOSE REDUCTION: This exam was performed according to the departmental dose-optimization program which includes automated exposure control, adjustment of the mA and/or kV according to patient size and/or use of iterative reconstruction technique. CONTRAST:  166m OMNIPAQUE IOHEXOL 300 MG/ML  SOLN COMPARISON:  07/30/2022 FINDINGS: Lower chest: Probable left anterior descending coronary artery stenting. Global cardiac size within normal limits. No pericardial effusion. Small bilateral pleural effusions have developed with associated mild bibasilar compressive atelectasis. Hepatobiliary: Status post cholecystectomy. No intra or extrahepatic biliary ductal dilation. Tiny focus of extraluminal gas adjacent to the distal common duct within the retroperitoneal soft tissues at axial image # 34/2 is seen, unchanged from prior examination. Additional punctate foci of extraluminal gas within this region appears to have resolved. Inflammatory changes within this region extending into the porta hepatis are again identified, possibly reflecting postsurgical changes related to reported sphincterotomy or changes related to acute interstitial/edematous pancreatitis. No enhancing intrahepatic mass identified. Portal vein is patent. Mild perihepatic ascites appears minimally increased. Pancreas: Previously noted hypoenhancement of the head of the pancreas has resolved and there is normal enhancement of the pancreatic parenchyma. The pancreatic duct is not dilated. As noted above, inflammatory changes adjacent to the head of the pancreas extending into the porta hepatis are again identified and appear unchanged. Inflammatory changes also extend into the right anterior pararenal space and involving the mesenteric fat along the right lateral abdominal wall with associated peritoneal enhancement and thickening. Duodenal diverticulum again noted within  the pancreatic head. Spleen: Unremarkable.  Mild perisplenic ascites has developed. Adrenals/Urinary Tract: The adrenal glands are unremarkable. The kidneys are normal save for simple cortical cyst within the interpolar region of the right kidney. No further follow-up imaging is recommended for this lesion. The bladder is unremarkable. Stomach/Bowel: There is circumferential wall thickening and mild surrounding inflammatory stranding involving the gastric antrum and duodenal bulb which may relate to the adjacent inflammatory changes within the retroperitoneum surrounding the head of the pancreas and extending into the porta hepatis. However, a superimposed infectious or inflammatory antritis/duodenitis is difficult to exclude. This appears similar to prior examination. The stomach, small bowel, and large bowel are otherwise unremarkable save for moderate descending and sigmoid colonic diverticulosis. Mild ascites has increased in the interval since prior examination. Inflammatory changes surround the appendix, similar to prior examination, likely related to tracking changes from the right  upper quadrant. No free intraperitoneal gas. Vascular/Lymphatic: Stable 4 cm infrarenal abdominal aortic aneurysm. Moderate aortoiliac atherosclerotic calcification. No pathologic adenopathy within the abdomen and pelvis. Reproductive: Moderate prostatic enlargement. Other: Tiny fat containing umbilical hernia. Bilateral inguinal hernia repair with mesh has been performed. Mild interval increase in diffuse subcutaneous body wall edema in keeping with progressive anasarca Musculoskeletal: Lytic expansile lesion within the left iliac wing is unchanged from prior examination, possibly posttraumatic in etiology. 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 Degenerative changes noted of the thoracolumbar spine. No acute bone abnormality. IMPRESSION: 1. Interval resolution of previously noted hypoenhancement of the head of the  pancreas with normal enhancement of the pancreatic parenchyma. 2. Persistent inflammatory changes within the retroperitoneum surrounding the head of the pancreas and extending into the porta hepatis and along the right lateral abdominal wall with associated peritoneal thickening and enhancement. This may reflect changes related to acute interstitial/edematous pancreatitis or reflect post surgical changes related to reported sphincterotomy with extraluminal, retroperitoneal fluid leak. Punctate foci of extraluminal gas within this region has improved in the interval since prior exam. No evidence of pancreatic necrosis. 3. Interval development of small bilateral pleural effusions, mild diffuse body wall edema and mild ascites in keeping with progressive mild anasarca. 4. Stable 4 cm infrarenal abdominal aortic aneurysm. 5. Moderate descending and sigmoid colonic diverticulosis. 6. Moderate prostatic enlargement. 7. Stable lytic lesion within the left iliac wing, possibly posttraumatic in etiology. Aortic Atherosclerosis (ICD10-I70.0). Electronically Signed   By: Fidela Salisbury M.D.   On: 08/02/2022 01:39   DG Chest Port 1V same Day  Result Date: 08/01/2022 CLINICAL DATA:  [id number]; shortness of breath EXAM: PORTABLE CHEST 1 VIEW COMPARISON:  Radiograph dated July 11, 2022, CT dated July 30, 2022 FINDINGS: The cardiomediastinal silhouette is unchanged in contour.Atherosclerotic calcifications of the aorta. No pleural effusion. No pneumothorax. RIGHT basilar platelike opacity. Visualized abdomen is unremarkable. IMPRESSION: RIGHT basilar platelike opacity most consistent with atelectasis. Electronically Signed   By: Valentino Saxon M.D.   On: 08/01/2022 11:07   CT ABDOMEN PELVIS W CONTRAST  Result Date: 07/30/2022 CLINICAL DATA:  Abdominal pain, post ERCP EXAM: CT ABDOMEN AND PELVIS WITH CONTRAST TECHNIQUE: Multidetector CT imaging of the abdomen and pelvis was performed using the standard protocol following  bolus administration of intravenous contrast. RADIATION DOSE REDUCTION: This exam was performed according to the departmental dose-optimization program which includes automated exposure control, adjustment of the mA and/or kV according to patient size and/or use of iterative reconstruction technique. CONTRAST:  61m OMNIPAQUE IOHEXOL 300 MG/ML  SOLN COMPARISON:  CT 06/23/2022 FINDINGS: Lower chest: No acute abnormality. Right basilar scarring/atelectasis. Hepatobiliary: Focal round hyperdensities in the hepatic hilum are presumed cholecystectomy clips (series 3/image 21). Pneumobilia likely from ERCP earlier today. The common bile duct is dilated measuring 1.3 cm in diameter and demonstrates mucosal hyperenhancement. Pancreas: Acute peripancreatic fluid about the head of the pancreas. 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 No pancreatic ductal dilation. Spleen: Normal in size without focal abnormality. Adrenals/Urinary Tract: Adrenal glands are unremarkable. Kidneys are normal, without renal calculi, focal lesion, or hydronephrosis. Bladder is unremarkable. Stomach/Bowel: Moderate hiatal hernia. Stomach is unremarkable. Wall thickening and hyperenhancement of the duodenum. Multiple duodenal diverticula. No definite duodenal perforation. Normal caliber large and small bowel. The appendix is not definitely seen. Vascular/Lymphatic: Mixed density aortic atherosclerotic plaque. Redemonstrated infrarenal abdominal aortic aneurysm measuring 4.0 cm. No suspicious abdominal or pelvic lymphadenopathy. Reproductive: Enlarged prostate. Other: Focal hyperdensity measuring  5 mm within the right pericolic gutter (series 3/image 38) is indeterminate but may represent a dropped gallstone. There is adjacent fluid and stranding. Musculoskeletal: Unchanged lytic lesion in the left iliac wing. IMPRESSION: 1. Findings compatible with post ERCP pancreatitis. There is poor enhancement  of the pancreatic head concerning for developing pancreatic necrosis. 2. Wall thickening and hyperenhancement of the duodenum is likely reactive secondary to pancreatitis. No definite perforation. 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 3. New hyperdensity within the right pericolic gutter with surrounding fluid and inflammatory change is indeterminate but concerning for a dropped gallstone. 4. Infrarenal abdominal aortic aneurysm measuring 4 mm is unchanged from 06/23/2022. Recommend follow-up every 12 months and vascular consultation. Critical Value/emergent results were called by telephone at the time of interpretation on 07/30/2022 at 9:45 pm to provider Sherwood Gambler , who verbally acknowledged these results. Electronically Signed   By: Placido Sou M.D.   On: 07/30/2022 22:00    Labs:  CBC: Recent Labs    08/08/22 0338 08/09/22 0339 08/10/22 0747 08/11/22 0448  WBC 11.8* 11.4* 10.6* 10.5  HGB 10.3* 11.3* 11.3* 11.0*  HCT 30.8* 33.9* 34.0* 32.0*  PLT 150 176 221 228    COAGS: Recent Labs    04/26/22 0843 08/01/22 1832 08/02/22 0703  INR 1.3*  --   --   APTT  --  121* 73*    BMP: Recent Labs    08/02/22 0703 08/03/22 0819 08/10/22 0747 08/11/22 0448  NA 136 139 137 136  K 3.2* 3.4* 3.1* 3.4*  CL 101 105 102 102  CO2 '23 26 28 29  '$ GLUCOSE 109* 95 113* 138*  BUN 24* 17 6* 7*  CALCIUM 8.3* 8.3* 8.4* 8.7*  CREATININE 0.98 0.79 0.88 0.82  GFRNONAA >60 >60 >60 >60    LIVER FUNCTION TESTS: Recent Labs    08/01/22 0717 08/02/22 0703 08/03/22 0819 08/10/22 0747  BILITOT 1.1 1.0 1.0 0.7  AST '22 19 18 17  '$ ALT '23 20 17 16  '$ ALKPHOS 53 72 64 73  PROT 5.2* 5.5* 5.0* 5.4*  ALBUMIN 2.4* 2.3* 2.0* 1.8*    TUMOR MARKERS: No results for input(s): "AFPTM", "CEA", "CA199", "CHROMGRNA" in the last 8760 hours.  Assessment and Plan:  Perihepatic fluid collection Scheduled for aspiration drain placement in IR Risks and benefits discussed with the patient  including bleeding, infection, damage to adjacent structures, bowel perforation/fistula connection, and sepsis.  All of the patient's questions were answered, patient is agreeable to proceed. Consent signed and in chart.   Thank you for this interesting consult.  I greatly enjoyed meeting Douglas Hurst. and look forward to participating in their care.  A copy of this report was sent to the requesting provider on this date.  Electronically Signed: Lavonia Drafts, PA-C 08/11/2022, 9:55 AM   I spent a total of 40 Minutes    in face to face in clinical consultation, greater than 50% of which was counseling/coordinating care for perihepatic fluid collection aspiration/drain

## 2022-08-11 NOTE — TOC Progression Note (Signed)
Transition of Care (TOC) - Progression Note    Patient Details  Name: Douglas Hurst. MRN: [id number] Date of Birth: 1941-05-09  Transition of Care [facility]) CM/SW [place], LCSW Phone Number: 08/11/2022, 10:15 AM  Clinical Narrative:    CSW still following for discharge planning once medically stable.    Expected Discharge Plan: Skilled Nursing Facility Barriers to Discharge: Ship broker, SNF Pending bed offer  Expected Discharge Plan and Services Expected Discharge Plan: [place] In-house Referral: Clinical Social Work   Post Acute Care Choice: [place] Living arrangements for the past 2 months: Single Family Home                                       Social Determinants of Health (SDOH) Interventions    Readmission Risk Interventions     No data to display

## 2022-08-11 NOTE — Progress Notes (Signed)
Douglas Hurst. 10:39 AM  Subjective: Patient seen and examined and case discussed with his wife and we had a long talk about his need to eat he likes oatmeal but cannot decide about other foods and we discussed drain versus aspiration and answered all of their questions  Objective: Vital signs stable afebrile no acute distress abdomen is soft nontender labs all stable  Assessment: ERCP complication  Plan: Patient and wife really dislikes the drain and if able just to aspirate a majority of the fluid and long is not obviously infected hopefully IR will feel comfortable not placing a drain and I encouraged him to eat more possibly 6 small meals a day and await chest x-ray results which she just returned from  [facility]  office [phone] After 5PM or if no answer call [phone]

## 2022-08-12 ENCOUNTER — Inpatient Hospital Stay (HOSPITAL_COMMUNITY): Payer: Medicare HMO

## 2022-08-12 DIAGNOSIS — K858 Other acute pancreatitis without necrosis or infection: Secondary | ICD-10-CM | POA: Diagnosis not present

## 2022-08-12 DIAGNOSIS — E43 Unspecified severe protein-calorie malnutrition: Secondary | ICD-10-CM | POA: Diagnosis not present

## 2022-08-12 LAB — GLUCOSE, CAPILLARY
Glucose-Capillary: 108 mg/dL — ABNORMAL HIGH (ref 70–99)
Glucose-Capillary: 106 mg/dL — ABNORMAL HIGH (ref 70–99)
Glucose-Capillary: 115 mg/dL — ABNORMAL HIGH (ref 70–99)
Glucose-Capillary: 114 mg/dL — ABNORMAL HIGH (ref 70–99)

## 2022-08-12 LAB — CBC
HCT: 31.2 % — ABNORMAL LOW (ref 39.0–52.0)
Hemoglobin: 10.3 g/dL — ABNORMAL LOW (ref 13.0–17.0)
MCH: 30.1 pg (ref 26.0–34.0)
MCHC: 33 g/dL (ref 30.0–36.0)
MCV: 91.2 fL (ref 80.0–100.0)
Platelets: 270 10*3/uL (ref 150–400)
RBC: 3.42 MIL/uL — ABNORMAL LOW (ref 4.22–5.81)
RDW: 15.7 % — ABNORMAL HIGH (ref 11.5–15.5)
WBC: 9.6 10*3/uL (ref 4.0–10.5)
nRBC: 0 % (ref 0.0–0.2)

## 2022-08-12 LAB — C-REACTIVE PROTEIN: CRP: 17.5 mg/dL — ABNORMAL HIGH (ref <1.0)

## 2022-08-12 LAB — BASIC METABOLIC PANEL
Anion gap: 6 (ref 5–15)
BUN: 8 mg/dL (ref 8–23)
CO2: 27 mmol/L (ref 22–32)
Calcium: 8.7 mg/dL — ABNORMAL LOW (ref 8.9–10.3)
Chloride: 103 mmol/L (ref 98–111)
Creatinine, Ser: 0.86 mg/dL (ref 0.61–1.24)
GFR, Estimated: >60 mL/min (ref >60)
Glucose, Bld: 119 mg/dL — ABNORMAL HIGH (ref 70–99)
Potassium: 3.8 mmol/L (ref 3.5–5.1)
Sodium: 136 mmol/L (ref 135–145)

## 2022-08-12 LAB — HEPARIN LEVEL (UNFRACTIONATED): Heparin Unfractionated: 0.37 IU/mL (ref 0.30–0.70)

## 2022-08-12 MED ORDER — VITAL 1.5 CAL PO LIQD
1000.0000 mL | ORAL | Status: DC
  Administered 2022-08-12 – 2022-08-17 (×5): 1000 mL
  Filled 2022-08-12 (×10): qty 1000

## 2022-08-12 MED ORDER — PROSOURCE TF20 ENFIT COMPATIBL EN LIQD
60.0000 mL | Freq: Every day | ENTERAL | Status: DC
  Administered 2022-08-12 – 2022-08-18 (×7): 60 mL
  Filled 2022-08-12 (×7): qty 60

## 2022-08-12 MED ORDER — FREE WATER
150.0000 mL | Status: DC
  Administered 2022-08-12 – 2022-08-23 (×63): 150 mL

## 2022-08-12 MED ORDER — FUROSEMIDE 10 MG/ML IJ SOLN
40.0000 mg | Freq: Once | INTRAMUSCULAR | Status: AC
Start: 2022-08-12 — End: 2022-08-12
  Administered 2022-08-12: 40 mg via INTRAVENOUS
  Filled 2022-08-12: qty 4

## 2022-08-12 NOTE — Progress Notes (Signed)
Referring Physician(s): Dr. Watt Climes  Supervising Physician: Sandi Mariscal  Patient Status:  [facility] - In-pt  Chief Complaint: Acute cholecystitis  Subjective: Working with OT at time of visit.  Able to assess drainage.  No complaints.   Allergies: Patient has no known allergies.  Medications: Prior to Admission medications   Medication Sig Start Date End Date Taking? Authorizing Provider  acetaminophen (TYLENOL) 325 MG tablet Take 2 tablets (650 mg total) by mouth every 6 (six) hours as needed for mild pain (or Fever >/= 101). 04/30/22  Yes Sheikh, Omair Latif, DO  amiodarone (PACERONE) 200 MG tablet Take 1 tablet (200 mg total) by mouth daily. 05/29/22  Yes Loel Dubonnet, NP  atorvastatin (LIPITOR) 40 MG tablet Take 40 mg by mouth every evening.   Yes [provider]  lisinopril-hydrochlorothiazide (ZESTORETIC) 10-12.5 MG tablet Take 0.5 tablets by mouth daily.   Yes [provider]  metFORMIN (GLUCOPHAGE-XR) 500 MG 24 hr tablet Take 500-1,000 mg by mouth See admin instructions. Take 2 tablets (1000 mg) by mouth in the morning and 1 tablet (500 mg) every evening 03/17/22  Yes [provider]  metoprolol tartrate (LOPRESSOR) 25 MG tablet Take 1 tablet (25 mg total) by mouth 2 (two) times daily. 05/29/22  Yes Loel Dubonnet, NP  Multiple Vitamin (MULTIVITAMIN WITH MINERALS) TABS tablet Take 1 tablet by mouth daily.   Yes [provider]  ondansetron (ZOFRAN) 4 MG tablet Take 1 tablet (4 mg total) by mouth every 6 (six) hours as needed for nausea. 04/30/22  Yes Sheikh, Omair Latif, DO  pantoprazole (PROTONIX) 40 MG tablet Take 40 mg by mouth every morning.   Yes [provider]  polyethylene glycol powder (GLYCOLAX/MIRALAX) 17 GM/SCOOP powder Take 17 g by mouth daily. Patient taking differently: Take 17 g by mouth daily as needed for mild constipation or moderate constipation. 04/30/22  Yes Sheikh, Omair Latif, DO  senna-docusate (SENOKOT-S) 8.6-50  MG tablet Take 1 tablet by mouth at bedtime. Patient taking differently: Take 1 tablet by mouth at bedtime as needed for mild constipation or moderate constipation. 04/30/22  Yes Sheikh, Omair Latif, DO  SPIRIVA RESPIMAT 2.5 MCG/ACT AERS Inhale 2 puffs into the lungs daily as needed (COPD). 06/04/22  Yes [provider]  tadalafil (CIALIS) 20 MG tablet Take 20 mg by mouth every three (3) days as needed for erectile dysfunction. 03/29/22  Yes [provider]  tamsulosin (FLOMAX) 0.4 MG CAPS capsule Take 0.4 mg by mouth daily after supper. 05/13/22  Yes [provider]  apixaban (ELIQUIS) 5 MG TABS tablet Take 1 tablet (5 mg total) by mouth 2 (two) times daily. Patient not taking: Reported on 07/31/2022 05/29/22   Loel Dubonnet, NP  oxyCODONE-acetaminophen (PERCOCET) 5-325 MG tablet Take 1 tablet by mouth every 4 (four) hours as needed for severe pain. Patient not taking: Reported on 07/31/2022 07/14/22 07/14/23  Norm Parcel, PA-C     Vital Signs: BP 136/68 (BP Location: Left Arm)   Pulse 80   Temp 97.7 F (36.5 C) (Oral)   Resp 18   Ht '6\' 1"'$  (1.854 m)   Wt 188 lb 7.9 oz (85.5 kg)   SpO2 94%   BMI 24.87 kg/m   Physical Exam NAD, alert Unable to assess insertion site.  Output thin, blood-tinged.   Imaging: DG Abd Portable 1V  Result Date: 08/12/2022 CLINICAL DATA:  Feeding tube placement EXAM: PORTABLE ABDOMEN - 1 VIEW COMPARISON:  August 09, 2022 FINDINGS: The  tip of the enteric tube projects over the expected region of the pylorus/proximal duodenum. Nonobstructive bowel gas pattern. Coils overlying the pelvis. Of note, the lateral aspect of the left hemiabdomen is excluded from the field of view. No acute osseous abnormality. IMPRESSION: The enteric tube tip is near the distal pylorus/proximal duodenum. Electronically Signed   By: Beryle Flock M.D.   On: 08/12/2022 11:49   CT GUIDED VISCERAL FLUID DRAIN BY PERC CATH  Result Date: 08/12/2022 INDICATION:  81 year old male with history of recent pancreatitis and subcapsular right hepatic fluid collection concerning for abscess, possible biloma. EXAM: CT IMAGE GUIDED DRAINAGE BY PERCUTANEOUS CATHETER COMPARISON:  08/09/2022 MEDICATIONS: The patient is currently admitted to the hospital and receiving intravenous antibiotics. The antibiotics were administered within an appropriate time frame prior to the initiation of the procedure. ANESTHESIA/SEDATION: Moderate (conscious) sedation was employed during this procedure. A total of Versed 1.5 mg and Fentanyl 50 mcg was administered intravenously. Moderate Sedation Time: 11 minutes. The patient's level of consciousness and vital signs were monitored continuously by radiology nursing throughout the procedure under my direct supervision. CONTRAST:  None COMPLICATIONS: None immediate. PROCEDURE: RADIATION DOSE REDUCTION: This exam was performed according to the departmental dose-optimization program which includes automated exposure control, adjustment of the mA and/or kV according to patient size and/or use of iterative reconstruction technique. Informed written consent was obtained from the patient after a discussion of the risks, benefits and alternatives to treatment. The patient was placed supine on the CT gantry and a pre procedural CT was performed re-demonstrating the known abscess/fluid collection within the subcapsular aspect of the right lobe of the liver. The procedure was planned. A timeout was performed prior to the initiation of the procedure. The right upper quadrant was prepped and draped in the usual sterile fashion. The overlying soft tissues were anesthetized with 1% lidocaine with epinephrine. Appropriate trajectory was planned with the use of a 22 gauge spinal needle. An 18 gauge trocar needle was advanced into the abscess/fluid collection and a short Amplatz super stiff wire was coiled within the collection. Appropriate positioning was confirmed with a  limited CT scan. The tract was serially dilated allowing placement of a 10.2 [place] all-purpose drainage catheter. Appropriate positioning was confirmed with a limited postprocedural CT scan. Approximately 200 ml of viscous, dark brown fluid was aspirated. The tube was connected to a drainage bag and sutured in place. A dressing was placed. The patient tolerated the procedure well without immediate post procedural complication. IMPRESSION: Successful CT guided placement of a 10.2 [place] all purpose drain catheter into the right subcapsular fluid collection with aspiration of approximately 200 mL of viscous, dark brown fluid. Samples were sent to the laboratory as requested by the ordering clinical team. Ruthann Cancer, MD [facility] Electronically Signed   By: Ruthann Cancer M.D.   On: 08/12/2022 07:48   DG Chest 2 View  Result Date: 08/11/2022 CLINICAL DATA:  81 year old male with respiratory failure and hypoxia. EXAM: CHEST - 2 VIEW COMPARISON:  CT Abdomen and Pelvis 08/09/2022. FINDINGS: Portable AP semi upright view at 1011 hours. Layering pleural effusions seen 2 days ago by CT, more apparent on the right. Mildly lower lung volumes since 08/01/2022. Normal cardiac size and mediastinal contours. Visualized tracheal air column is within normal limits. No pneumothorax. Increased pulmonary vascularity diffusely. No consolidation. No acute osseous abnormality identified. IMPRESSION: Ongoing pleural effusions more apparent on the right. Increased pulmonary vascular congestion since 08/01/2022 suspicious for mild  interstitial edema. Electronically Signed   By: Genevie Ann M.D.   On: 08/11/2022 10:36   CT ABDOMEN W CONTRAST  Result Date: 08/09/2022 CLINICAL DATA:  Follow-up acute pancreatitis. EXAM: CT ABDOMEN WITH CONTRAST TECHNIQUE: Multidetector CT imaging of the abdomen was performed using the standard protocol following bolus administration of  intravenous contrast. RADIATION DOSE REDUCTION: This exam was performed according to the departmental dose-optimization program which includes automated exposure control, adjustment of the mA and/or kV according to patient size and/or use of iterative reconstruction technique. CONTRAST:  58m OMNIPAQUE IOHEXOL 300 MG/ML  SOLN COMPARISON:  08/02/2022 FINDINGS: Lower chest: Moderate bilateral pleural effusions and bilateral lower lobe atelectasis have increased since prior exam. Hepatobiliary: No hepatic masses identified. Prior cholecystectomy. No evidence of biliary obstruction. Increased size of large multiloculated fluid collections with peripheral rim enhancement are seen in the right and left perihepatic spaces, consistent with enlarging pseudocysts. Pancreas: Inflammatory changes are again seen surrounding the pancreatic head, with increased adjacent fluid collection showing peripheral rim enhancement which measures 5.5 x 5.2 cm on image 38/3. This is consistent with a developing pseudocyst. A few adjacent extraluminal air bubbles are again noted. No evidence of free intraperitoneal air. Spleen:  Within normal limits in size and appearance. Adrenals/Urinary Tract: No masses identified. Stable small right renal cyst (no followup imaging recommended) . No evidence of hydronephrosis. Stomach/Bowel: Unremarkable. Vascular/Lymphatic: No pathologically enlarged lymph nodes identified. Stable 3.9 cm infrarenal abdominal aortic aneurysm. Other:  None. Musculoskeletal:  No suspicious bone lesions identified. 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 Tiny extraluminal air bubbles are again noted adjacent to the pancreatic head and duodenum. Increased moderate bilateral pleural effusions and bilateral lower lobe atelectasis. Stable 3.9 cm infrarenal abdominal aortic aneurysm. Recommend follow-up every 2 years. This recommendation follows ACR  consensus guidelines: White Paper of the ACR Incidental Findings Committee II on Vascular Findings. J Am Coll Radiol 2013; 10:789-794. Electronically Signed   By: JMarlaine HindM.D.   On: 08/09/2022 14:39    Labs:  CBC: Recent Labs    08/09/22 0339 08/10/22 0747 08/11/22 0448 08/12/22 0505  WBC 11.4* 10.6* 10.5 9.6  HGB 11.3* 11.3* 11.0* 10.3*  HCT 33.9* 34.0* 32.0* 31.2*  PLT 176 221 228 270    COAGS: Recent Labs    04/26/22 0843 08/01/22 1832 08/02/22 0703 08/11/22 0941  INR 1.3*  --   --  1.1  APTT  --  121* 73*  --     BMP: Recent Labs    08/03/22 0819 08/10/22 0747 08/11/22 0448 08/12/22 0505  NA 139 137 136 136  K 3.4* 3.1* 3.4* 3.8  CL 105 102 102 103  CO2 '26 28 29 27  '$ GLUCOSE 95 113* 138* 119*  BUN 17 6* 7* 8  CALCIUM 8.3* 8.4* 8.7* 8.7*  CREATININE 0.79 0.88 0.82 0.86  GFRNONAA >60 >60 >60 >60    LIVER FUNCTION TESTS: Recent Labs    08/01/22 0717 08/02/22 0703 08/03/22 0819 08/10/22 0747  BILITOT 1.1 1.0 1.0 0.7  AST '22 19 18 17  '$ ALT '23 20 17 16  '$ ALKPHOS 53 72 64 73  PROT 5.2* 5.5* 5.0* 5.4*  ALBUMIN 2.4* 2.3* 2.0* 1.8*    Assessment and Plan: Hepatic abscess s/p drain placement. Drain Location: RUQ Size: Fr size: 10 Fr Date of placement: 08/11/22  Currently to: Drain collection device: gravity 24 hour output:  Output by Drain (mL) 08/10/22 0701 - 08/10/22 1900 08/10/22  1901 - 08/11/22 0700 08/11/22 0701 - 08/11/22 1900 08/11/22 1901 - 08/12/22 0700 08/12/22 0701 - 08/12/22 1556  Closed System Drain 1 Right;Anterior;Lateral RUQ Other (Comment) 10 Fr.   200      Interval imaging/drain manipulation:  None  Current examination: Working with OT.  WBC improved.  Patient without complaints.   Plan: Continue TID flushes with 5 cc NS. Record output Q shift. Dressing changes QD or PRN if soiled.  Call IR APP or on call IR MD if difficulty flushing or sudden change in drain output.  Repeat imaging/possible drain injection once output <  10 mL/QD (excluding flush material). Consideration for drain removal if output is < 10 mL/QD (excluding flush material), pending discussion with the providing surgical service.  Discharge planning: Please contact IR APP or on call IR MD prior to patient d/c to ensure appropriate follow up plans are in place. Typically patient will follow up with IR clinic 10-14 days post d/c for repeat imaging/possible drain injection. IR scheduler will contact patient with date/time of appointment. Patient will need to flush drain QD with 5 cc NS, record output QD, dressing changes every 2-3 days or earlier if soiled.   IR will continue to follow - please call with questions or concerns.   Electronically Signed: Docia Barrier, PA 08/12/2022, 3:54 PM   I spent a total of 15 Minutes at the the patient's bedside AND on the patient's hospital floor or unit, greater than 50% of which was counseling/coordinating care for hepatic abscess.

## 2022-08-12 NOTE — Progress Notes (Signed)
ANTICOAGULATION CONSULT NOTE - Follow Up Consult  Pharmacy Consult for Heparin Indication: atrial fibrillation  No Known Allergies  Patient Measurements: Height: '6\' 1"'$  (185.4 cm) Weight: 85.5 kg (188 lb 7.9 oz) IBW/kg (Calculated) : 79.9 Heparin Dosing Weight: 80 kg  Vital Signs: Temp: 98 F (36.7 C) (08/16 0752) Temp Source: Oral (08/16 0752) BP: 133/66 (08/16 0752) Pulse Rate: 77 (08/16 0752)  Labs: Recent Labs    08/10/22 0747 08/10/22 1839 08/11/22 0448 08/11/22 0941 08/12/22 0505  HGB 11.3*  --  11.0*  --  10.3*  HCT 34.0*  --  32.0*  --  31.2*  PLT 221  --  228  --  270  LABPROT  --   --   --  14.3  --   INR  --   --   --  1.1  --   HEPARINUNFRC 0.23* 0.29* 0.40  --  0.37  CREATININE 0.88  --  0.82  --  0.86     Estimated Creatinine Clearance: 78.7 mL/min (by C-G formula based on SCr of 0.86 mg/dL).  Assessment: 81 y/o male whose pertinent medication history includes Afib , on Eliquis prior to  admission and CHF. Pharmacy has been consulted for IV heparin dosing, no bolus per MD. PTA Eliquis is on hold. His last dose of Eliquis was on 07/26/22 PTA.  Heparin level 0.37 therapeutic on  Heparin infusion 1500 units/hr.   Hgb stable 11s -10s. Platelet count  within normal.  No bleeding reported. MD plans to transition back to Eliquis once durably tolerating PO and no procedure planned.   s/p CT guided abdominal drain placement today 08/12/22.    Goal of Therapy:  Heparin level 0.3-0.7 units/ml Monitor platelets by anticoagulation protocol: Yes   Plan:  Continue IV heparin drip to 1500 units/hr Daily HL and CBC Follow up for plans to transition back to Eliquis once tolerating PO and no procedures planned.  Nicole Cella, RPh Clinical Pharmacist [phone] 08/12/2022,9:55 AM Please check AMION for all [place] phone numbers After 10:00 PM, call [place] [phone]

## 2022-08-12 NOTE — Progress Notes (Signed)
Initial Nutrition Assessment  DOCUMENTATION CODES:   Severe malnutrition in context of chronic illness  INTERVENTION:  Initiate tube feeding via Cortrak (tip near distal pylorus/proximal duodenum): Start Vital 1.5 at 20 ml/h and advance by 54m q8h until a goal rate of 641mhr (1440 ml per day) Prosource TF20 60 ml once daily Free water flushes 1501m4h   Provides 2240 kcal, 117 gm protein, 2000 ml free water daily  Monitor magnesium, potassium, and phosphorus BID for at least 3 days, MD to replete as needed, as pt is at risk for refeeding syndrome   NUTRITION DIAGNOSIS:   Severe Malnutrition related to chronic illness (COPD, HF, acute pancreatitis) as evidenced by severe fat depletion, moderate fat depletion, severe muscle depletion, moderate muscle depletion.  GOAL:   Patient will meet greater than or equal to 90% of their needs  MONITOR:   Diet advancement, Labs, Weight trends, I & O's, TF tolerance  REASON FOR ASSESSMENT:   Consult Enteral/tube feeding initiation and management  ASSESSMENT:   Pt admitted with abdomina pain s/p ERCP, found to have acute pancreatitis. PMH significant for bipolar d/o, T2DM, HTN, HLD, GERD, COPD, Afib, dHF, AAA, interim h/o acute cholecystitis. S/p laparoscopic cholecystectomy with laparoscopic common duct exploration 7/23  CT upon admission findings of acute pancreatitis with area of necrosis  8/15: s/p CT guided abdominal drain placement 8/16: Cortrak placed (tip near distal pylorus/proximal duodenum  Spoke with MD and GI. Ok to start tube feeding as is without further advancement and will monitor for tolerance.   Pt laying in bed with Cortrak placed. Wife at bedside. He reports not eating well throughout admission. His wife states that he has not enjoyed being on a liquid diet. Up until this admission she reports that he was eating well. [facility]. Denies having had any  difficulty chewing or swallowing PTA. No current nausea and vomiting per patient. He also denies current abdominal pain.   Pt's wife reports that he had about a 10 lb weight loss PTA stating his weight was 187 lbs at that time. He is uncertain of his current weight but suspects on going weight loss.  Unfortunately there is limited documentation of weight history to review within the last year. Since 04/29, his weight appears to have remained fairly stable between 82.6-85.5 kg. Will continue to monitor throughout admission.   Medications: lasix, SSI 0-96 units TID, miralax, senna  Labs: HgbA1c 5.6% (07/23), CBG's 108-157 x24 hours  RUQ drain: 200m77m2 hours  NUTRITION - FOCUSED PHYSICAL EXAM:  Flowsheet Row Most Recent Value  Orbital Region Severe depletion  Upper Arm Region Severe depletion  Thoracic and Lumbar Region Moderate depletion  Buccal Region Moderate depletion  Temple Region Moderate depletion  Clavicle Bone Region Severe depletion  Clavicle and Acromion Bone Region Severe depletion  Scapular Bone Region Severe depletion  Dorsal Hand Moderate depletion  Patellar Region Severe depletion  Anterior Thigh Region Severe depletion  Posterior Calf Region Moderate depletion  Edema (RD Assessment) None  Hair Reviewed  Eyes Reviewed  Mouth Reviewed  Skin Reviewed  Nails Reviewed      Diet Order:   Diet Order             Diet NPO time specified Except for: Sips with Meds, Ice Chips  Diet effective now                   EDUCATION NEEDS:  Education needs have been addressed  Skin:  Skin Assessment: Skin Integrity Issues: Skin Integrity Issues:: Other (Comment) Other: RU abdomen drain  Last BM:  8/14 (type 6)  Height:   Ht Readings from Last 1 Encounters:  07/30/22 '6\' 1"'$  (1.854 m)    Weight:   Wt Readings from Last 1 Encounters:  08/11/22 85.5 kg   BMI:  Body mass index is 24.87 kg/m.  Estimated Nutritional Needs:   Kcal:  2200-2400  Protein:   115-130g  Fluid:  >/=2L  Clayborne Dana, RDN, LDN Clinical Nutrition

## 2022-08-12 NOTE — Progress Notes (Signed)
Occupational Therapy Treatment Patient Details Name: Douglas Hurst. MRN: [id number] DOB: [birth year] Today's Date: 08/12/2022   History of present illness Patient is a 81 yo male presenting to the ED with severe abdominal pain with radiation to right shoulder on 07/31/22. Patient had underwent ECRP for removal of stones from biliary and pancreatic duct on 8/3. Admitted with acute pancreatitis. PMH includes: bipolar d/o DMII, HTN, HLD, GERD, COPD ,  Afib ,diastolic heart failure, AAA with interim history of acute cholecystitis.   OT comments  Pt currently min assist for supine to sit and for completion of stand pivot transfers simulated to the [facility].  Oxygen sats able to stay above 90% on room air during this session at rest and during grooming tasks in sitting and transfer/ standing.  Will continue to follow for acute care OT needs with hopeful transition to AIR for more intense therapy for home.     Recommendations for follow up therapy are one component of a multi-disciplinary discharge planning process, led by the attending physician.  Recommendations may be updated based on patient status, additional functional criteria and insurance authorization.    Follow Up Recommendations  Acute inpatient rehab (3hours/day)    Assistance Recommended at Discharge Frequent or constant Supervision/Assistance  Patient can return home with the following  A little help with walking and/or transfers;A little help with bathing/dressing/bathroom;Assistance with cooking/housework;Assist for transportation;Help with stairs or ramp for entrance;Direct supervision/assist for medications management   Equipment Recommendations  Other (comment) (TBD next venue of care)       Precautions / Restrictions Precautions Precautions: Fall Precaution Comments: abdominal pain Restrictions Weight Bearing Restrictions: No       Mobility Bed Mobility Overal bed mobility: Needs Assistance Bed Mobility: Rolling Rolling:  Min assist Sidelying to sit: Min assist            Transfers Overall transfer level: Needs assistance Equipment used: Rolling walker (2 wheels) Transfers: Sit to/from Stand Sit to Stand: Min assist     Step pivot transfers: Min assist     General transfer comment: Min instructional cueing for hand placement with sit to stand secondary to pt wanting to place hands on the RW instead of pushing up from the arms of the chair.     Balance Overall balance assessment: Needs assistance Sitting-balance support: Feet supported, Bilateral upper extremity supported Sitting balance-Leahy Scale: Good Sitting balance - Comments: Pt able to maintain balance while sitting EOB without support.   Standing balance support: During functional activity, Reliant on assistive device for balance Standing balance-Leahy Scale: Poor Standing balance comment: Needs BUE support on the RW.                           ADL either performed or assessed with clinical judgement   ADL Overall ADL's : Needs assistance/impaired Eating/Feeding: Minimal assistance Eating/Feeding Details (indicate cue type and reason): pt worked on shaving with his electric razor in sitting EOB and then EOC.                                   General ADL Comments: Pt's O2 sats at 90-93% during session on room air.  HR stable in the mid 70s to low 80s throughout.  He was able to sit EOB to complete shaving with the electric razor to start.  Transferred over to the bedside recliner with min assist to  finish secondary to fatigue.               Cognition Arousal/Alertness: Awake/alert Behavior During Therapy: Flat affect Overall Cognitive Status: Within Functional Limits for tasks assessed                                                General Comments BP 136/68 (89) pre-mobility; BP 145/74 (95) post-bed mobility; SpO2 94% on RA; HR 79-83 bpm    Pertinent Vitals/ Pain       Pain  Assessment Pain Assessment: No/denies pain         Frequency  Min 3X/week        Progress Toward Goals  OT Goals(current goals can now be found in the care plan section)  Progress towards OT goals: Progressing toward goals  Acute Rehab OT Goals Patient Stated Goal: Pt requested to get up to the bedside recliner OT Goal Formulation: With patient Time For Goal Achievement: 08/17/22 Potential to Achieve Goals: Good  Plan Discharge plan remains appropriate       AM-PAC OT "6 Clicks" Daily Activity     Outcome Measure   Help from another person eating meals?: None Help from another person taking care of personal grooming?: A Little Help from another person toileting, which includes using toliet, bedpan, or urinal?: A Lot Help from another person bathing (including washing, rinsing, drying)?: A Little Help from another person to put on and taking off regular upper body clothing?: None Help from another person to put on and taking off regular lower body clothing?: A Little 6 Click Score: 19    End of Session Equipment Utilized During Treatment: Gait belt;Rolling walker (2 wheels)  OT Visit Diagnosis: Unsteadiness on feet (R26.81);Muscle weakness (generalized) (M62.81)   Activity Tolerance Patient tolerated treatment well   Patient Left in chair;with call bell/phone within reach;with chair alarm set   Nurse Communication Mobility status        Time: 1410-1450 OT Time Calculation (min): 40 min  Charges: OT General Charges $OT Visit: 1 Visit OT Treatments $Self Care/Home Management : 38-52 mins  Cecilia Vancleve OTR/L 08/12/2022, 3:07 PM

## 2022-08-12 NOTE — Progress Notes (Signed)
Douglas Hurst. 10:46 AM  Subjective: Patient seen and examined and I had a long conversation with both the hospital team and his wife and brother as well and we discussed his nutritional status his pain is minimal p.o. intake and answered all of their questions  Objective: Vital signs stable afebrile no acute distress sitting up reading the paper this morning abdomen is soft nontender by my exam labs are all stable drain with 200 cc initially and a little bit in the bag now mostly bloody  Assessment: ERCP complication  Plan: Agree with tube feeds to try to build his nutrition and wean pain medicines slowly and if fluid culture is negative consider stopping antibiotics soon and depending how he does with the tube feeds consider nighttime feeds and allow him to eat during the day and will check on tomorrow and hopefully drain will not need to be in very long and await IR's decision on that  [facility]  office [phone] After 5PM or if no answer call [phone]

## 2022-08-12 NOTE — Progress Notes (Signed)
Progress Note  Patient: Douglas Hurst. LTJ:[id number] DOB: 04/20/1941  DOA: 07/30/2022  DOS: 08/12/2022    Brief hospital course: Douglas Hurst. is a 81 y.o. male with a history of HFw/recoveredEF, PAF, NIDT2DM, HTN, HLD, GERD, COPD, and acute calculous cholecystitis and choledocholithiasis admitted here 4/29, discharged with cholecystostomy tube and discharged. He had AFib w/RVR and CHF during that admit, but with rate and rhythm control, EF normalized. He had unsuccessful ERCP and subsequent laparoscopic cholecystectomy 7/23 with inability to clear the bile duct. Referred for ERCP at [facility]. [facility]vous ERCP was performed 8/3 and he was discharged home only to return [facility] with abdominal pain and post-ERCP pancreatitis with imaging concerning for necrosis. Also had difficulty urinating. IVF, IV antibiotics were started, and GI consulted on admission. Hospitalization complicated by AFib w/RVR which has since converted to NSR. Urinary retention improved on higher dose tamsulosin. He failed advancing diet on 8/10, repeat CT showed enlarging pseudocysts. GI recommends IR evaluation.    Assessment and Plan: Acute pancreatitis-duodenitis with contained duodenal perforation (s/p rendezvous ERCP; transduodenal approach to puncture extrahepatic bile duct to place guidewire into duodenum-subsequently ERCP scope inserted and stone removed):  - CT repeated 8/13 showing fluid collections, enlarging. Favor reactive inflammation and not necessarily pancreatic pseudocyst. Discussed with GI this AM. Note lipase was normal on admission. No pancreatic duct involvement in preceding procedure. ?if symptoms primarily related to duodenal inflammation. Initial concern for pancreatic head necrosis not borne out with serial exams, currently appears normal. s/p drain into fluid collection with significant output so will continue this drain for now. Appreciate IR reevaluations.  - Leukocytosis is persistent, though if cultures  NG would stop zosyn.  - Discussed risk of malnutrition at length with patient and family, opted to initiate tube feeds today. With concern more for self-limited duodenitis/duodenal perforation, ordered postpyloric tube insertion. This appears to be around the level of the duodenum on my review of XR. D/w GI, ok to use at that level.   PAF with RVR: RVR precipitated by acute pancreatitis.  - Remains in NSR on amiodarone. BP rising, will restart home metoprolol as well.  - Continue heparin gtt, plan to transition to DOAC once durably tolerating PO and no procedure planned.   Acute hypoxic respiratory failure: Due to pleural effusions, atelectasis based on personal review of CT abd 8/13.  - Stopped IVF. Will check CXR. Pancreatitis patients at high risk of infections.  - Wean O2 as tolerated.  - Consider lasix.  - Incentive spirometry.   Ambulatory dysfunction: Pt very weakened/deconditioned. - Ordered OOB qShift if possible, continue PT/OT. Current recommendation has been for CIR.    AKI: Prerenal and suspected postrenal. Resolved.  - SCr is wnl. Monitor volume status  Acute urinary retention, BPH: Resolved retention.    - Continue tamsulosin 0.'8mg'$  daily  Constipation: Resolved.  - Deescalate bowel regimen to miralax and senna once daily.   Chronic HFrEF with recovered EF: LVEF 60-65%, G1DD, normal RV and PASP on echo May 2023.  - Weight coming back down with initiation of lasix. Admit weight ~80kg, up to 88.6kg 8/10, since declining with good UOP, 85.5kg this AM. Continue IV lasix, may administer BID depending on ongoing UOP and tube feed volumes.    NIDT2DM: Well controlled chronically with HbA1c 5.6% July 2023.  - Continue SSI, remains at inpatient goal.    HTN: BP soft-continue to hold all antihypertensives.   COPD: No wheezing.  - Continue bronchodilators   Infrarenal abdominal aortic  aneurysm 4 mm: Unchanged from prior study-annual follow-up is required per  protocol  Hypokalemia: Resolved. Will monitor, With recent AFib, goal would be 4.0    Subjective: Has been taking dilaudid very regularly, not taking much by mouth but still trying. Denies shortness of breath but isn't getting OOB much. He and family would like to bolster nutrition.   Objective: Vitals:   08/12/22 0752 08/12/22 1209 08/12/22 1616 08/12/22 1943  BP: 133/66 136/68 123/67 132/66  Pulse: 77 80 70 80  Resp: '18 18 15 18  '$ Temp: 98 F (36.7 C) 97.7 F (36.5 C) (!) 97.3 F (36.3 C) 98.2 F (36.8 C)  TempSrc: Oral Oral Oral Oral  SpO2: 92% 94% 94% 95%  Weight:      Height:      Gen: Thin elderly male in no distress Pulm: Nonlabored breathing supplemental oxygen, diminished at bases.  CV: Regular rate and rhythm. No murmur, rub, or gallop. No JVD, + dependent edema. GI: Abdomen soft, modestly diffusely tender without rebound or guarding, non-distended, with normoactive bowel sounds.  Ext: Warm, no deformities Skin: No new rashes, lesions or ulcers on visualized skin. drain site c/d/I with brownish red output.  Neuro: Alert and oriented. No focal neurological deficits. Psych: Judgement and insight appear fair. Mood euthymic & affect congruent. Behavior is appropriate.    Data Personally reviewed: CBC: Recent Labs  Lab 08/08/22 0338 08/09/22 0339 08/10/22 0747 08/11/22 0448 08/12/22 0505  WBC 11.8* 11.4* 10.6* 10.5 9.6  HGB 10.3* 11.3* 11.3* 11.0* 10.3*  HCT 30.8* 33.9* 34.0* 32.0* 31.2*  MCV 89.3 89.9 89.5 88.2 91.2  PLT 150 176 221 228 962   Basic Metabolic Panel: Recent Labs  Lab 08/10/22 0747 08/11/22 0448 08/12/22 0505  NA 137 136 136  K 3.1* 3.4* 3.8  CL 102 102 103  CO2 '28 29 27  '$ GLUCOSE 113* 138* 119*  BUN 6* 7* 8  CREATININE 0.88 0.82 0.86  CALCIUM 8.4* 8.7* 8.7*   GFR: Estimated Creatinine Clearance: 78.7 mL/min (by C-G formula based on SCr of 0.86 mg/dL). Liver Function Tests: Recent Labs  Lab 08/10/22 0747  AST 17  ALT 16  ALKPHOS 73   BILITOT 0.7  PROT 5.4*  ALBUMIN 1.8*   No results for input(s): "LIPASE", "AMYLASE" in the last 168 hours. No results for input(s): "AMMONIA" in the last 168 hours. Coagulation Profile: Recent Labs  Lab 08/11/22 0941  INR 1.1   Cardiac Enzymes: No results for input(s): "CKTOTAL", "CKMB", "CKMBINDEX", "TROPONINI" in the last 168 hours. BNP (last 3 results) No results for input(s): "PROBNP" in the last 8760 hours. HbA1C: No results for input(s): "HGBA1C" in the last 72 hours. CBG: Recent Labs  Lab 08/11/22 1721 08/11/22 2053 08/12/22 0751 08/12/22 1211 08/12/22 1618  GLUCAP 139* 154* 108* 106* 115*   Lipid Profile: No results for input(s): "CHOL", "HDL", "LDLCALC", "TRIG", "CHOLHDL", "LDLDIRECT" in the last 72 hours. Thyroid Function Tests: No results for input(s): "TSH", "T4TOTAL", "FREET4", "T3FREE", "THYROIDAB" in the last 72 hours. Anemia Panel: No results for input(s): "VITAMINB12", "FOLATE", "FERRITIN", "TIBC", "IRON", "RETICCTPCT" in the last 72 hours. Urine analysis:    Component Value Date/Time   COLORURINE YELLOW 04/25/2022 1232   APPEARANCEUR CLEAR 04/25/2022 1232   LABSPEC <1.005 (L) 04/25/2022 1232   PHURINE 6.0 04/25/2022 1232   GLUCOSEU 100 (A) 04/25/2022 1232   HGBUR TRACE (A) 04/25/2022 [place] 04/25/2022 [place] 04/25/2022 1232   PROTEINUR NEGATIVE 04/25/2022 1232  UROBILINOGEN 4.0 (H) 10/02/2015 2135   NITRITE POSITIVE (A) 04/25/2022 1232   LEUKOCYTESUR SMALL (A) 04/25/2022 1232   Recent Results (from the past 240 hour(s))  Aerobic/Anaerobic Culture w Gram Stain (surgical/deep wound)     Status: None (Preliminary result)   Collection Time: 08/11/22  4:42 PM   Specimen: Abscess  Result Value Ref Range Status   Specimen Description ABSCESS  Final   Special Requests LIVER  Final   Gram Stain NO ORGANISMS SEEN NO WBC SEEN   Final   Culture   Final    NO GROWTH < 24 HOURS Performed at [facility], [place] [address] [place], [place] [address]    Report Status PENDING  Incomplete     DG Abd Portable 1V  Result Date: 08/12/2022 CLINICAL DATA:  Feeding tube placement EXAM: PORTABLE ABDOMEN - 1 VIEW COMPARISON:  August 09, 2022 FINDINGS: The tip of the enteric tube projects over the expected region of the pylorus/proximal duodenum. Nonobstructive bowel gas pattern. Coils overlying the pelvis. Of note, the lateral aspect of the left hemiabdomen is excluded from the field of view. No acute osseous abnormality. IMPRESSION: The enteric tube tip is near the distal pylorus/proximal duodenum. Electronically Signed   By: Beryle Flock M.D.   On: 08/12/2022 11:49   CT GUIDED VISCERAL FLUID DRAIN BY PERC CATH  Result Date: 08/12/2022 INDICATION: 81 year old male with history of recent pancreatitis and subcapsular right hepatic fluid collection concerning for abscess, possible biloma. EXAM: CT IMAGE GUIDED DRAINAGE BY PERCUTANEOUS CATHETER COMPARISON:  08/09/2022 MEDICATIONS: The patient is currently admitted to the hospital and receiving intravenous antibiotics. The antibiotics were administered within an appropriate time frame prior to the initiation of the procedure. ANESTHESIA/SEDATION: Moderate (conscious) sedation was employed during this procedure. A total of Versed 1.5 mg and Fentanyl 50 mcg was administered intravenously. Moderate Sedation Time: 11 minutes. The patient's level of consciousness and vital signs were monitored continuously by radiology nursing throughout the procedure under my direct supervision. CONTRAST:  None COMPLICATIONS: None immediate. PROCEDURE: RADIATION DOSE REDUCTION: This exam was performed according to the departmental dose-optimization program which includes automated exposure control, adjustment of the mA and/or kV according to patient size and/or use of iterative reconstruction technique. Informed written consent was obtained from the patient after a discussion of  the risks, benefits and alternatives to treatment. The patient was placed supine on the CT gantry and a pre procedural CT was performed re-demonstrating the known abscess/fluid collection within the subcapsular aspect of the right lobe of the liver. The procedure was planned. A timeout was performed prior to the initiation of the procedure. The right upper quadrant was prepped and draped in the usual sterile fashion. The overlying soft tissues were anesthetized with 1% lidocaine with epinephrine. Appropriate trajectory was planned with the use of a 22 gauge spinal needle. An 18 gauge trocar needle was advanced into the abscess/fluid collection and a short Amplatz super stiff wire was coiled within the collection. Appropriate positioning was confirmed with a limited CT scan. The tract was serially dilated allowing placement of a 10.2 [place] all-purpose drainage catheter. Appropriate positioning was confirmed with a limited postprocedural CT scan. Approximately 200 ml of viscous, dark brown fluid was aspirated. The tube was connected to a drainage bag and sutured in place. A dressing was placed. The patient tolerated the procedure well without immediate post procedural complication. IMPRESSION: Successful CT guided placement of a 10.2 [place] all purpose drain catheter into the right subcapsular fluid  collection with aspiration of approximately 200 mL of viscous, dark brown fluid. Samples were sent to the laboratory as requested by the ordering clinical team. Ruthann Cancer, MD [facility] Electronically Signed   By: Ruthann Cancer M.D.   On: 08/12/2022 07:48   DG Chest 2 View  Result Date: 08/11/2022 CLINICAL DATA:  81 year old male with respiratory failure and hypoxia. EXAM: CHEST - 2 VIEW COMPARISON:  CT Abdomen and Pelvis 08/09/2022. FINDINGS: Portable AP semi upright view at 1011 hours. Layering pleural effusions seen 2 days ago by CT, more apparent on  the right. Mildly lower lung volumes since 08/01/2022. Normal cardiac size and mediastinal contours. Visualized tracheal air column is within normal limits. No pneumothorax. Increased pulmonary vascularity diffusely. No consolidation. No acute osseous abnormality identified. IMPRESSION: Ongoing pleural effusions more apparent on the right. Increased pulmonary vascular congestion since 08/01/2022 suspicious for mild interstitial edema. Electronically Signed   By: Genevie Ann M.D.   On: 08/11/2022 10:36    Family Communication: Spouse by phone yesterday, spouse and brother at bedside today  Disposition: Status is: Inpatient Remains inpatient appropriate because: Persistent pancreatitis/duodenitis Planned Discharge Destination: CIR  Patrecia Pour, MD 08/12/2022 8:12 PM Page by [email or web address]

## 2022-08-12 NOTE — Procedures (Signed)
Cortrak  Person Inserting Tube:  Alroy Dust, Naika Noto L, RD Tube Type:  Cortrak - 43 inches Tube Size:  10 Tube Location:  Left nare Secured by: Bridle Technique Used to Measure Tube Placement:  Marking at nare/corner of mouth Cortrak Secured At:  95 cm   Cortrak Tube Team Note:  Consult received to place a Cortrak feeding tube.   X-ray is required, abdominal x-ray has been ordered by the Cortrak team. Please confirm tube placement before using the Cortrak tube.   If the tube becomes dislodged please keep the tube and contact the Cortrak team at [email or web address] ([email or web address]) for replacement.  If after hours and replacement cannot be delayed, place a NG tube and confirm placement with an abdominal x-ray.    Hermina Barters RD, LDN Clinical Dietitian See [facility] for contact information.

## 2022-08-12 NOTE — Progress Notes (Signed)
Physical Therapy Treatment Patient Details Name: Douglas Hurst. MRN: [id number] DOB: [birth year] Today's Date: 08/12/2022   History of Present Illness Patient is a 81 yo male presenting to the ED with severe abdominal pain with radiation to right shoulder on 07/31/22. Patient had underwent ECRP for removal of stones from biliary and pancreatic duct on 8/3. Admitted with acute pancreatitis. PMH includes: bipolar d/o DMII, HTN, HLD, GERD, COPD ,  Afib ,diastolic heart failure, AAA with interim history of acute cholecystitis.    PT Comments    Pt received in supine, lethargic/fatigued and agreeable to limited therapy session at bed-level. Pt found to be in wet bed needing full bed change due to urinary incontinence, encouraged pt to sit up in chair for strengthening while linens changed however he refuses due to fatigue. Pt needing minA to roll multiple reps to ea side during bed linen change/clean-up and receptive to instruction on LE HEP for strengthening but unable to complete full set of repetitions for each exercise due to severe c/o fatigue. Handout given to reinforce. NT in room to replace condom cath at end of session. Pt continues to benefit from PT services to progress toward functional mobility goals.   Recommendations for follow up therapy are one component of a multi-disciplinary discharge planning process, led by the attending physician.  Recommendations may be updated based on patient status, additional functional criteria and insurance authorization.  Follow Up Recommendations  Acute inpatient rehab (3hours/day)     Assistance Recommended at Discharge Frequent or constant Supervision/Assistance  Patient can return home with the following A lot of help with walking and/or transfers;A lot of help with bathing/dressing/bathroom;Assistance with cooking/housework;Direct supervision/assist for medications management;Direct supervision/assist for financial management;Assist for  transportation;Help with stairs or ramp for entrance   Equipment Recommendations  None recommended by PT    Recommendations for Other Services Rehab consult     Precautions / Restrictions Precautions Precautions: Fall Precaution Comments: abdominal pain Restrictions Weight Bearing Restrictions: No     Mobility  Bed Mobility Overal bed mobility: Needs Assistance Bed Mobility: Rolling Rolling: Min assist         General bed mobility comments: x6 reps rolling to L/R sides while bed linens changed and pt lower body cleaned for hygiene, NT present to assist with cleaning/bed linen change.    Transfers Overall transfer level: Needs assistance                 General transfer comment: pt defers due to severe fatigue after rolling for bed linen change and multiple procedures in past day.       Balance Overall balance assessment: Needs assistance     Sitting balance - Comments: pt defers full upright posture, using bed side rails for balance with partial transfer to long sit                                    Cognition Arousal/Alertness: Lethargic Behavior During Therapy: Flat affect                                   General Comments: following 1-step commands with increased time, pt c/o fatigue after drain placed yesterday evening and today cortrak placement. Pt spouse present and receptive to instruction.        Exercises General Exercises - Lower Extremity Ankle Circles/Pumps: AROM, Both,  5 reps, Supine Quad Sets: AROM, Both, 5 reps, Supine Gluteal Sets: AROM, Both, 5 reps, Supine Short Arc Quad: AROM, Supine (3-5 reps ea due to fatigue) Heel Slides: Both, Supine (a couple reps ea side due to fatigue) Hip ABduction/ADduction: AAROM, Both, Supine (1-2 reps ea for teachback)    General Comments General comments (skin integrity, edema, etc.): BP 136/68 (89) pre-mobility; BP 145/74 (95) post-bed mobility; SpO2 94% on RA; HR 79-83  bpm      Pertinent Vitals/Pain Pain Assessment Pain Assessment: Faces Faces Pain Scale: Hurts a little bit Pain Location: abdomen (more to R side) Pain Descriptors / Indicators: Grimacing Pain Intervention(s): Limited activity within patient's tolerance, Monitored during session, Premedicated before session, Repositioned           PT Goals (current goals can now be found in the care plan section) Acute Rehab PT Goals Patient Stated Goal: to get stronger and pain reduced. PT Goal Formulation: With patient/family Time For Goal Achievement: 08/17/22 Progress towards PT goals: Progressing toward goals    Frequency    Min 3X/week      PT Plan Current plan remains appropriate       AM-PAC PT "6 Clicks" Mobility   Outcome Measure  Help needed turning from your back to your side while in a flat bed without using bedrails?: A Little Help needed moving from lying on your back to sitting on the side of a flat bed without using bedrails?: A Little Help needed moving to and from a bed to a chair (including a wheelchair)?: A Lot (anticipate based on bed mobility/fatigue level) Help needed standing up from a chair using your arms (e.g., wheelchair or bedside chair)?: A Lot Help needed to walk in hospital room?: Total Help needed climbing 3-5 steps with a railing? : Total 6 Click Score: 12    End of Session   Activity Tolerance: Patient limited by fatigue Patient left: in bed;with call bell/phone within reach;with bed alarm set;with family/visitor present (spouse in room) Nurse Communication: Mobility status PT Visit Diagnosis: Muscle weakness (generalized) (M62.81);Difficulty in walking, not elsewhere classified (R26.2);Other abnormalities of gait and mobility (R26.89);Pain Pain - part of body:  (r flank)     Time: 1610-9604 PT Time Calculation (min) (ACUTE ONLY): 29 min  Charges:  $Therapeutic Exercise: 8-22 mins $Therapeutic Activity: 8-22 mins                      Lucius Wise P., PTA Acute Rehabilitation Services Secure Chat Preferred 9a-5:30pm Office: [place] 08/12/2022, 2:21 PM

## 2022-08-13 DIAGNOSIS — K858 Other acute pancreatitis without necrosis or infection: Secondary | ICD-10-CM | POA: Diagnosis not present

## 2022-08-13 DIAGNOSIS — E43 Unspecified severe protein-calorie malnutrition: Secondary | ICD-10-CM | POA: Diagnosis not present

## 2022-08-13 LAB — PHOSPHORUS
Phosphorus: 3 mg/dL (ref 2.5–4.6)
Phosphorus: 2.6 mg/dL (ref 2.5–4.6)

## 2022-08-13 LAB — CBC
HCT: 33.1 % — ABNORMAL LOW (ref 39.0–52.0)
Hemoglobin: 10.8 g/dL — ABNORMAL LOW (ref 13.0–17.0)
MCH: 29.3 pg (ref 26.0–34.0)
MCHC: 32.6 g/dL (ref 30.0–36.0)
MCV: 89.9 fL (ref 80.0–100.0)
Platelets: 300 10*3/uL (ref 150–400)
RBC: 3.68 MIL/uL — ABNORMAL LOW (ref 4.22–5.81)
RDW: 15.8 % — ABNORMAL HIGH (ref 11.5–15.5)
WBC: 9.6 10*3/uL (ref 4.0–10.5)
nRBC: 0 % (ref 0.0–0.2)

## 2022-08-13 LAB — BASIC METABOLIC PANEL
Anion gap: 8 (ref 5–15)
BUN: 11 mg/dL (ref 8–23)
CO2: 27 mmol/L (ref 22–32)
Calcium: 8.7 mg/dL — ABNORMAL LOW (ref 8.9–10.3)
Chloride: 102 mmol/L (ref 98–111)
Creatinine, Ser: 0.87 mg/dL (ref 0.61–1.24)
GFR, Estimated: >60 mL/min (ref >60)
Glucose, Bld: 114 mg/dL — ABNORMAL HIGH (ref 70–99)
Potassium: 3.9 mmol/L (ref 3.5–5.1)
Sodium: 137 mmol/L (ref 135–145)

## 2022-08-13 LAB — HEPARIN LEVEL (UNFRACTIONATED): Heparin Unfractionated: 0.37 IU/mL (ref 0.30–0.70)

## 2022-08-13 LAB — GLUCOSE, CAPILLARY
Glucose-Capillary: 151 mg/dL — ABNORMAL HIGH (ref 70–99)
Glucose-Capillary: 173 mg/dL — ABNORMAL HIGH (ref 70–99)
Glucose-Capillary: 149 mg/dL — ABNORMAL HIGH (ref 70–99)
Glucose-Capillary: 180 mg/dL — ABNORMAL HIGH (ref 70–99)

## 2022-08-13 LAB — MAGNESIUM
Magnesium: 1.9 mg/dL (ref 1.7–2.4)
Magnesium: 2 mg/dL (ref 1.7–2.4)

## 2022-08-13 MED ORDER — FENTANYL 12 MCG/HR TD PT72
1.0000 | MEDICATED_PATCH | TRANSDERMAL | Status: DC
  Administered 2022-08-13 – 2022-09-06 (×9): 1 via TRANSDERMAL
  Filled 2022-08-13 (×10): qty 1

## 2022-08-13 MED ORDER — FUROSEMIDE 10 MG/ML IJ SOLN
40.0000 mg | Freq: Two times a day (BID) | INTRAMUSCULAR | Status: DC
  Administered 2022-08-13 – 2022-08-14 (×3): 40 mg via INTRAVENOUS
  Filled 2022-08-13 (×3): qty 4

## 2022-08-13 NOTE — Progress Notes (Signed)
SATURATION QUALIFICATIONS: (This note is used to comply with regulatory documentation for home oxygen)  Patient Saturations on Room Air at Rest = 87%  Patient Saturations on Room Air while Ambulating = N/A  Patient Saturations on 3 Liters of oxygen at Rest = 92%  Please briefly explain why patient needs home oxygen: Pt currently requiring 3L to maintain SpO2 92% and greater at rest. Pt defers OOB mobility today unable to assess O2 sats while ambulating.

## 2022-08-13 NOTE — Progress Notes (Signed)
Tube feed increased by 96m  to 434mat this time.

## 2022-08-13 NOTE — Progress Notes (Signed)
ANTICOAGULATION CONSULT NOTE - Follow Up Consult  Pharmacy Consult for Heparin Indication: atrial fibrillation  No Known Allergies  Patient Measurements: Height: '6\' 1"'$  (185.4 cm) Weight: 79.2 kg (174 lb 9.7 oz) IBW/kg (Calculated) : 79.9 Heparin Dosing Weight: 80 kg  Vital Signs: Temp: 98.1 F (36.7 C) (08/17 0800) Temp Source: Oral (08/17 0800) BP: 125/72 (08/17 0800) Pulse Rate: 78 (08/17 0800)  Labs: Recent Labs    08/11/22 0448 08/11/22 0941 08/12/22 0505 08/13/22 0431  HGB 11.0*  --  10.3* 10.8*  HCT 32.0*  --  31.2* 33.1*  PLT 228  --  270 300  LABPROT  --  14.3  --   --   INR  --  1.1  --   --   HEPARINUNFRC 0.40  --  0.37 0.37  CREATININE 0.82  --  0.86 0.87     Estimated Creatinine Clearance: 77.1 mL/min (by C-G formula based on SCr of 0.87 mg/dL).  Assessment: 81 y/o male whose pertinent medication history includes Afib , on Eliquis prior to  admission and CHF. Pharmacy has been consulted for IV heparin dosing, no bolus per MD. PTA Eliquis is on hold. His last dose of Eliquis was on 07/26/22 PTA.  Heparin level remains therapeutic on  Heparin infusion 1500 units/hr.   Hgb stable 11s -10s. Platelet count  within normal /stable.  No bleeding reported. MD plans to transition back to Eliquis once durably tolerating PO and no procedure planned.   s/p CT guided abdominal drain placement today 08/11/22.    Goal of Therapy:  Heparin level 0.3-0.7 units/ml Monitor platelets by anticoagulation protocol: Yes   Plan:  Continue IV heparin drip to 1500 units/hr Daily HL and CBC Follow up for plans to transition back to Eliquis once tolerating PO and no procedures planned.  Nicole Cella, RPh Clinical Pharmacist [phone] 08/13/2022,10:17 AM Please check AMION for all [place] phone numbers After 10:00 PM, call [place] [phone]

## 2022-08-13 NOTE — Progress Notes (Signed)
Physical Therapy Treatment Patient Details Name: Douglas Hurst. MRN: [id number] DOB: [birth year] Today's Date: 08/13/2022   History of Present Illness Patient is a 81 yo male presenting to the ED with severe abdominal pain with radiation to right shoulder on 07/31/22. Patient had underwent ECRP for removal of stones from biliary and pancreatic duct on 8/3. Admitted with acute pancreatitis. PMH includes: bipolar d/o DMII, HTN, HLD, GERD, COPD ,  Afib ,diastolic heart failure, AAA with interim history of acute cholecystitis.    PT Comments    Pt received in supine, agreeable to therapy session after initial reluctance when encouraged to reposition in bed due to sub-optimal position for back comfort and cortrak/digestion. Pt needing +2 maxA for posterior supine scooting toward HOB and min to modA for reclined to long sitting transfer in bed. Pt c/o fatigue after performing supine UE/LE exercises in bed and adamantly refusing EOB/OOB transfers today. Discussed benefits of OOB transfers for pressure relief, improved pulmonary clearance and strength/endurance building but pt defers, he was agreeable to bed placed in upright chair position. L lean with reclined posture so pillow under LUE to promote neutral seated posture. Pt with decreased pulmonary clearance today using IS (see below) and desat when weaned to RA so repositioned [place] and WFL on 3L/min. Pt continues to benefit from PT services to progress toward functional mobility goals.   Recommendations for follow up therapy are one component of a multi-disciplinary discharge planning process, led by the attending physician.  Recommendations may be updated based on patient status, additional functional criteria and insurance authorization.  Follow Up Recommendations  Acute inpatient rehab (3hours/day)     Assistance Recommended at Discharge Frequent or constant Supervision/Assistance  Patient can return home with the following A lot of help with  walking and/or transfers;A lot of help with bathing/dressing/bathroom;Assistance with cooking/housework;Direct supervision/assist for medications management;Direct supervision/assist for financial management;Assist for transportation;Help with stairs or ramp for entrance   Equipment Recommendations  None recommended by PT    Recommendations for Other Services Rehab consult     Precautions / Restrictions Precautions Precautions: Fall Precaution Comments: cortrak Restrictions Weight Bearing Restrictions: No     Mobility  Bed Mobility Overal bed mobility: Needs Assistance Bed Mobility: Supine to Sit, Sit to Supine     Supine to sit: Min assist, Mod assist, HOB elevated Sit to supine: Min assist, HOB elevated   General bed mobility comments: pulling to long sit x5 reps from [facility] ~50*, pt using B side rails and min to modA trunk support, supine rest a few seconds in between reps. Pt refusing EOB transfer or standing/OOB.    Transfers                   General transfer comment: pt refusing due to fatigue.      Balance Overall balance assessment: Needs assistance Sitting-balance support: Feet supported, Bilateral upper extremity supported Sitting balance-Leahy Scale: Fair Sitting balance - Comments: in long sit in bed needs BUE support       Standing balance comment: pt defers today                            Cognition Arousal/Alertness: Awake/alert Behavior During Therapy: Flat affect Overall Cognitive Status: Within Functional Limits for tasks assessed         General Comments: following 1-step commands with increased time, pt c/o fatigue and refused OT in AM, initially wanted to refuse PT however agreeable  to repositioning for improved comfort/pressure relief in bed and then to supine exercises with encouragement.        Exercises General Exercises - Lower Extremity Ankle Circles/Pumps: AROM, Both, Supine, 10 reps Long Arc Quad: AROM, Both, 20  reps (bed in chair posture) Hip Flexion/Marching: AROM, Both, Seated, 20 reps Other Exercises Other Exercises: IS x 10 reps (200-300 mL) Other Exercises: AROM: pulling to long sit from [place] ~50* using side rails "bed crunches" with cues for core activation x5 reps some AA behind back for fully upright posture    General Comments General comments (skin integrity, edema, etc.): Pt received with [place] only in single nares and agreeable to removal during supine exercises with bed in high fowlers position. Pt desat to 87% after a few minutes despite Incentive Spirometry use so replaced on 3L O2 [place] and SpO2 reading 92% and greater on 3L. HR 70's-80's bpm with supine exercises.      Pertinent Vitals/Pain Pain Assessment Pain Assessment: No/denies pain Pain Intervention(s): Monitored during session, Repositioned     PT Goals (current goals can now be found in the care plan section) Acute Rehab PT Goals Patient Stated Goal: to get stronger and pain reduced. PT Goal Formulation: With patient/family Time For Goal Achievement: 08/17/22 Progress towards PT goals: Progressing toward goals    Frequency    Min 3X/week      PT Plan Current plan remains appropriate (may need to space out rehab full 7 days in post-acute to get total amount of hours)       AM-PAC PT "6 Clicks" Mobility   Outcome Measure  Help needed turning from your back to your side while in a flat bed without using bedrails?: A Little Help needed moving from lying on your back to sitting on the side of a flat bed without using bedrails?: A Little Help needed moving to and from a bed to a chair (including a wheelchair)?: A Lot Help needed standing up from a chair using your arms (e.g., wheelchair or bedside chair)?: A Lot Help needed to walk in hospital room?: Total Help needed climbing 3-5 steps with a railing? : Total 6 Click Score: 12    End of Session Equipment Utilized During Treatment: Oxygen Activity Tolerance:  Patient limited by fatigue Patient left: in bed;with call bell/phone within reach;with bed alarm set;Other (comment) (pillow under L elbow for support due to L lean) Nurse Communication: Mobility status PT Visit Diagnosis: Muscle weakness (generalized) (M62.81);Difficulty in walking, not elsewhere classified (R26.2);Other abnormalities of gait and mobility (R26.89);Pain Pain - Right/Left: Right     Time: 6734-1937 PT Time Calculation (min) (ACUTE ONLY): 21 min  Charges:  $Therapeutic Exercise: 8-22 mins                     Shriya Aker P., PTA Acute Rehabilitation Services Secure Chat Preferred 9a-5:30pm Office: [place] 08/13/2022, 3:50 PM

## 2022-08-13 NOTE — Progress Notes (Signed)
Douglas Hurst. 5:40 PM  Subjective: Patient seen and examined and case discussed with his wife and the hospital team and I think his pain is from the drain and is not doing as well today because of the tube in his nose and we discussed how he needed to be active in physical therapy which he did this afternoon it sounds like but not this morning and he does not remember a bowel movement today although 1 is in the chart and he has no new complaints  Objective: Vital signs stable afebrile no acute distress his abdominal exam is soft nontender his drain output is less than yesterday based on the computer he is not sure how many times they have changed the bag labs okay  Assessment: Multiple medical problems including failure to thrive  Plan: If doing better tomorrow may allow p.o.'s of choice like his oatmeal that he likes and I believe his pain is just from the drain and please encourage him to do OT and PT and consider upper GI with Gastrografin to rule out a slow leak or possibly even an endoscopy to evaluate the upper tract and continue to try to wean pain medicine and if not needed sooner consider a repeat CT early next week to reevaluate his liver fluid collection in his pancreas and make sure oral contrast is given or could even put it down his tube which would also help confirm or identify a leak and will check on tomorrow  [facility]  office [phone] After 5PM or if no answer call [phone]

## 2022-08-13 NOTE — Progress Notes (Signed)
Progress Note  Patient: Douglas Hurst. WCH:[id number] DOB: February 04, 1941  DOA: 07/30/2022  DOS: 08/13/2022    Brief hospital course: Douglas Hurst. is a 81 y.o. male with a history of HFw/recoveredEF, PAF, NIDT2DM, HTN, HLD, GERD, COPD, and acute calculous cholecystitis and choledocholithiasis admitted here 4/29, discharged with cholecystostomy tube and discharged. He had AFib w/RVR and CHF during that admit, but with rate and rhythm control, EF normalized. He had unsuccessful ERCP and subsequent laparoscopic cholecystectomy 7/23 with inability to clear the bile duct. Referred for ERCP at [facility]. [facility] ERCP was performed 8/3 and he was discharged home only to return to [facility] with abdominal pain and post-ERCP pancreatitis with imaging concerning for necrosis. Also had difficulty urinating. IVF, IV antibiotics were started, and GI consulted on admission. Hospitalization complicated by AFib w/RVR which has since converted to NSR. Urinary retention improved on higher dose tamsulosin. He failed advancing diet on 8/10, repeat CT showed enlarging perihepatic fluid collections. IR placed drain 8/15.  Assessment and Plan: Acute pancreatitis-duodenitis with contained duodenal perforation (s/p rendezvous ERCP; transduodenal approach to puncture extrahepatic bile duct to place guidewire into duodenum-subsequently ERCP scope inserted and stone removed):  - CT repeated 8/13 showing fluid collections, enlarging. Favor reactive inflammation and not necessarily pancreatic pseudocyst. Discussed with GI daily. Could consider upper endoscopy vs. gastrografin exam to r/o persistent leak. No contrast into fluid collection on CT's. Note lipase was normal on admission. No pancreatic duct involvement in preceding procedure. ?if symptoms primarily related to duodenal inflammation/puncture. Initial concern for pancreatic head necrosis not borne out with serial exams, currently appears normal. - s/p aspiration and drain  placement for perihepatic fluid collection, continues to have significant dark brown output.  - Leukocytosis resolved, afebrile, culture remains negative, has received 14 full days of IV antibiotics. We will stop zosyn and monitor for now.  - Pain control as ordered, would prefer to limit reliance on IV narcotic. Will trial fentanyl patch as well.  - s/p cortrak placed 8/16, continue TFs    PAF with RVR: RVR precipitated by acute pancreatitis.  - Remains in NSR on amiodarone, metoprolol - Continue heparin gtt, plan to transition to DOAC once durably tolerating PO and no procedure planned.   Acute hypoxic respiratory failure: Due to pleural effusions, atelectasis, improved. - Wean O2 as tolerated.  - Incentive spirometry.   Ambulatory dysfunction: Pt very weakened/deconditioned. - Ordered OOB qShift if possible, continue PT/OT. Current recommendation has been for CIR.    AKI: Prerenal and suspected postrenal. Resolved.  - SCr is wnl. Monitor volume status  Acute urinary retention, BPH: Resolved retention.    - Continue tamsulosin 0.'8mg'$  daily  Constipation: Resolved.  - Deescalate bowel regimen to miralax and senna once daily.   Chronic HFrEF with recovered EF: LVEF 60-65%, G1DD, normal RV and PASP on echo May 2023.  - Weight coming back down with initiation of lasix. Admit weight ~80kg, up to 88.6kg 8/10, since declining with good UOP, 79.2kg this AM. Continue IV lasix BID and monitoring volume status   NIDT2DM: Well controlled chronically with HbA1c 5.6% July 2023.  - Continue SSI, remains at inpatient goal.    HTN:  - Continue meds   COPD: No wheezing.  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 Will monitor, With recent AFib, goal would be 4.0    Subjective: Continues to report significant upper/RUQ abdominal pain that is constant, he  feels discomfort  in his throat extending down the neck/chest since cortrak placed.   Objective: Vitals:   08/13/22 0318 08/13/22 0617 08/13/22 0800 08/13/22 1200  BP: 134/66  125/72 137/69  Pulse: 74  78 76  Resp: 15  (!) 21 13  Temp: 98.3 F (36.8 C)  98.1 F (36.7 C)   TempSrc: Oral  Oral   SpO2: 92%  90% 97%  Weight:  79.2 kg    Height:      Gen: Thin elderly male in no distress Pulm: Nonlabored breathing supplemental oxygen, diminished at bases.  CV: Regular rate and rhythm. No murmur, rub, or gallop. No JVD, + dependent edema. GI: Abdomen soft, modestly diffusely tender without rebound or guarding, non-distended, with normoactive bowel sounds.  Ext: Warm, no deformities Skin: No new rashes, lesions or ulcers on visualized skin. drain site c/d/I with brownish red output.  Neuro: Alert and oriented. No focal neurological deficits. Psych: Judgement and insight appear fair. Mood euthymic & affect congruent. Behavior is appropriate.  Gen: Thin elderly male in no distress Pulm: Nonlabored breathing supplemental oxygen, diminished at bases still without wheezes. CV: RRR, no murmur, rub, or gallop. No JVD, trace dependent edema. GI: Abdomen soft, diffusely tender in right side and epigastrium, no rebound or guarding, elsewhere is non-tender, non-distended, with normoactive bowel sounds.  Ext: Warm, no deformities Skin: No new rashes, lesions or ulcers on visualized skin. Drain site is c/d/I with continued brown output to bag. Neuro: Alert and oriented. No focal neurological deficits. Psych: Judgement and insight appear fair. Mood euthymic & affect congruent. Behavior is appropriate.      Data Personally reviewed: CBC: Recent Labs  Lab 08/09/22 0339 08/10/22 0747 08/11/22 0448 08/12/22 0505 08/13/22 0431  WBC 11.4* 10.6* 10.5 9.6 9.6  HGB 11.3* 11.3* 11.0* 10.3* 10.8*  HCT 33.9* 34.0* 32.0* 31.2* 33.1*  MCV 89.9 89.5 88.2 91.2 89.9  PLT 176 221 228 270 497   Basic Metabolic Panel: Recent  Labs  Lab 08/10/22 0747 08/11/22 0448 08/12/22 0505 08/13/22 0431  NA 137 136 136 137  K 3.1* 3.4* 3.8 3.9  CL 102 102 103 102  CO2 '28 29 27 27  '$ GLUCOSE 113* 138* 119* 114*  BUN 6* 7* 8 11  CREATININE 0.88 0.82 0.86 0.87  CALCIUM 8.4* 8.7* 8.7* 8.7*  MG  --   --   --  1.9  PHOS  --   --   --  3.0   GFR: Estimated Creatinine Clearance: 77.1 mL/min (by C-G formula based on SCr of 0.87 mg/dL). Liver Function Tests: Recent Labs  Lab 08/10/22 0747  AST 17  ALT 16  ALKPHOS 73  BILITOT 0.7  PROT 5.4*  ALBUMIN 1.8*   No results for input(s): "LIPASE", "AMYLASE" in the last 168 hours. No results for input(s): "AMMONIA" in the last 168 hours. Coagulation Profile: Recent Labs  Lab 08/11/22 0941  INR 1.1   Cardiac Enzymes: No results for input(s): "CKTOTAL", "CKMB", "CKMBINDEX", "TROPONINI" in the last 168 hours. BNP (last 3 results) No results for input(s): "PROBNP" in the last 8760 hours. HbA1C: No results for input(s): "HGBA1C" in the last 72 hours. CBG: Recent Labs  Lab 08/12/22 1211 08/12/22 1618 08/12/22 2129 08/13/22 0809 08/13/22 1132  GLUCAP 106* 115* 114* 151* 173*   Lipid Profile: No results for input(s): "CHOL", "HDL", "LDLCALC", "TRIG", "CHOLHDL", "LDLDIRECT" in the last 72 hours. Thyroid Function Tests: No results for input(s): "TSH", "T4TOTAL", "FREET4", "T3FREE", "THYROIDAB" in the last 72  hours. Anemia Panel: No results for input(s): "VITAMINB12", "FOLATE", "FERRITIN", "TIBC", "IRON", "RETICCTPCT" in the last 72 hours. Urine analysis:    Component Value Date/Time   COLORURINE YELLOW 04/25/2022 1232   APPEARANCEUR CLEAR 04/25/2022 1232   LABSPEC <1.005 (L) 04/25/2022 1232   PHURINE 6.0 04/25/2022 1232   GLUCOSEU 100 (A) 04/25/2022 1232   HGBUR TRACE (A) 04/25/2022 1232   BILIRUBINUR NEGATIVE 04/25/2022 1232   KETONESUR NEGATIVE 04/25/2022 1232   PROTEINUR NEGATIVE 04/25/2022 1232   UROBILINOGEN 4.0 (H) 10/02/2015 2135   NITRITE POSITIVE (A)  04/25/2022 1232   LEUKOCYTESUR SMALL (A) 04/25/2022 1232   Recent Results (from the past 240 hour(s))  Aerobic/Anaerobic Culture w Gram Stain (surgical/deep wound)     Status: None (Preliminary result)   Collection Time: 08/11/22  4:42 PM   Specimen: Abscess  Result Value Ref Range Status   Specimen Description ABSCESS  Final   Special Requests LIVER  Final   Gram Stain NO ORGANISMS SEEN NO WBC SEEN   Final   Culture   Final    NO GROWTH 2 DAYS NO ANAEROBES ISOLATED; CULTURE IN PROGRESS FOR 5 DAYS Performed at [facility], [place] [address], [place], [place] [address]    Report Status PENDING  Incomplete     DG Abd Portable 1V  Result Date: 08/12/2022 CLINICAL DATA:  Feeding tube placement EXAM: PORTABLE ABDOMEN - 1 VIEW COMPARISON:  August 09, 2022 FINDINGS: The tip of the enteric tube projects over the expected region of the pylorus/proximal duodenum. Nonobstructive bowel gas pattern. Coils overlying the pelvis. Of note, the lateral aspect of the left hemiabdomen is excluded from the field of view. No acute osseous abnormality. IMPRESSION: The enteric tube tip is near the distal pylorus/proximal duodenum. Electronically Signed   By: Beryle Flock M.D.   On: 08/12/2022 11:49   CT GUIDED VISCERAL FLUID DRAIN BY PERC CATH  Result Date: 08/12/2022 INDICATION: 80 year old male with history of recent pancreatitis and subcapsular right hepatic fluid collection concerning for abscess, possible biloma. EXAM: CT IMAGE GUIDED DRAINAGE BY PERCUTANEOUS CATHETER COMPARISON:  08/09/2022 MEDICATIONS: The patient is currently admitted to the hospital and receiving intravenous antibiotics. The antibiotics were administered within an appropriate time frame prior to the initiation of the procedure. ANESTHESIA/SEDATION: Moderate (conscious) sedation was employed during this procedure. A total of Versed 1.5 mg and Fentanyl 50 mcg was administered intravenously. Moderate Sedation Time: 11 minutes. The  patient's level of consciousness and vital signs were monitored continuously by radiology nursing throughout the procedure under my direct supervision. CONTRAST:  None COMPLICATIONS: None immediate. PROCEDURE: RADIATION DOSE REDUCTION: This exam was performed according to the departmental dose-optimization program which includes automated exposure control, adjustment of the mA and/or kV according to patient size and/or use of iterative reconstruction technique. Informed written consent was obtained from the patient after a discussion of the risks, benefits and alternatives to treatment. The patient was placed supine on the CT gantry and a pre procedural CT was performed re-demonstrating the known abscess/fluid collection within the subcapsular aspect of the right lobe of the liver. The procedure was planned. A timeout was performed prior to the initiation of the procedure. The right upper quadrant was prepped and draped in the usual sterile fashion. The overlying soft tissues were anesthetized with 1% lidocaine with epinephrine. Appropriate trajectory was planned with the use of a 22 gauge spinal needle. An 18 gauge trocar needle was advanced into the abscess/fluid collection and a short Amplatz super stiff wire  was coiled within the collection. Appropriate positioning was confirmed with a limited CT scan. The tract was serially dilated allowing placement of a 10.2 [place] all-purpose drainage catheter. Appropriate positioning was confirmed with a limited postprocedural CT scan. Approximately 200 ml of viscous, dark brown fluid was aspirated. The tube was connected to a drainage bag and sutured in place. A dressing was placed. The patient tolerated the procedure well without immediate post procedural complication. IMPRESSION: Successful CT guided placement of a 10.2 [place] all purpose drain catheter into the right subcapsular fluid collection with aspiration of approximately 200 mL of viscous, dark brown fluid.  Samples were sent to the laboratory as requested by the ordering clinical team. Ruthann Cancer, MD [facility] Electronically Signed   By: Ruthann Cancer M.D.   On: 08/12/2022 07:48    Family Communication: None at bedside today.  Disposition: Status is: Inpatient Remains inpatient appropriate because: Persistent abdominal pain Planned Discharge Destination: CIR  Patrecia Pour, MD 08/13/2022 2:53 PM Page by [email or web address]

## 2022-08-13 NOTE — Progress Notes (Signed)
OT Cancellation Note  Patient Details Name: Douglas Hurst. MRN: [id number] DOB: 12-18-1941   Cancelled Treatment:    Reason Eval/Treat Not Completed: Patient declined, expressed feeling unwell since Dobhoff tube inserted. Pt was encouraged with education on importance of therapy ad lib, however pt asked to take the day off as he still felt unwell and did not sleep last night much.   Julien Girt 08/13/2022, 11:32 AM

## 2022-08-14 DIAGNOSIS — K858 Other acute pancreatitis without necrosis or infection: Secondary | ICD-10-CM | POA: Diagnosis not present

## 2022-08-14 DIAGNOSIS — E43 Unspecified severe protein-calorie malnutrition: Secondary | ICD-10-CM | POA: Diagnosis not present

## 2022-08-14 LAB — BASIC METABOLIC PANEL
Anion gap: 9 (ref 5–15)
BUN: 15 mg/dL (ref 8–23)
CO2: 28 mmol/L (ref 22–32)
Calcium: 8.7 mg/dL — ABNORMAL LOW (ref 8.9–10.3)
Chloride: 99 mmol/L (ref 98–111)
Creatinine, Ser: 0.79 mg/dL (ref 0.61–1.24)
GFR, Estimated: >60 mL/min (ref >60)
Glucose, Bld: 204 mg/dL — ABNORMAL HIGH (ref 70–99)
Potassium: 3.7 mmol/L (ref 3.5–5.1)
Sodium: 136 mmol/L (ref 135–145)

## 2022-08-14 LAB — GLUCOSE, CAPILLARY
Glucose-Capillary: 205 mg/dL — ABNORMAL HIGH (ref 70–99)
Glucose-Capillary: 183 mg/dL — ABNORMAL HIGH (ref 70–99)
Glucose-Capillary: 213 mg/dL — ABNORMAL HIGH (ref 70–99)
Glucose-Capillary: 202 mg/dL — ABNORMAL HIGH (ref 70–99)

## 2022-08-14 LAB — CBC
HCT: 33.2 % — ABNORMAL LOW (ref 39.0–52.0)
Hemoglobin: 11 g/dL — ABNORMAL LOW (ref 13.0–17.0)
MCH: 29.9 pg (ref 26.0–34.0)
MCHC: 33.1 g/dL (ref 30.0–36.0)
MCV: 90.2 fL (ref 80.0–100.0)
Platelets: 333 10*3/uL (ref 150–400)
RBC: 3.68 MIL/uL — ABNORMAL LOW (ref 4.22–5.81)
RDW: 15.5 % (ref 11.5–15.5)
WBC: 9 10*3/uL (ref 4.0–10.5)
nRBC: 0 % (ref 0.0–0.2)

## 2022-08-14 LAB — MAGNESIUM
Magnesium: 1.9 mg/dL (ref 1.7–2.4)
Magnesium: 2 mg/dL (ref 1.7–2.4)

## 2022-08-14 LAB — PHOSPHORUS
Phosphorus: 2.2 mg/dL — ABNORMAL LOW (ref 2.5–4.6)
Phosphorus: 2.9 mg/dL (ref 2.5–4.6)

## 2022-08-14 LAB — HEPARIN LEVEL (UNFRACTIONATED): Heparin Unfractionated: 0.3 IU/mL (ref 0.30–0.70)

## 2022-08-14 MED ORDER — SODIUM PHOSPHATES 45 MMOLE/15ML IV SOLN
30.0000 mmol | Freq: Once | INTRAVENOUS | Status: AC
  Administered 2022-08-14: 30 mmol via INTRAVENOUS
  Filled 2022-08-14 (×2): qty 10

## 2022-08-14 NOTE — Progress Notes (Signed)
Physical Therapy Treatment Patient Details Name: Douglas Hurst. MRN: [id number] DOB: 07/01/1941 Today's Date: 08/14/2022   History of Present Illness Patient is a 81 yo male presenting to the ED with severe abdominal pain with radiation to right shoulder on 07/31/22. Patient had underwent ECRP for removal of stones from biliary and pancreatic duct on 8/3. Admitted with acute pancreatitis. PMH includes: bipolar d/o DMII, HTN, HLD, GERD, COPD ,  Afib ,diastolic heart failure, AAA with interim history of acute cholecystitis.    PT Comments    Pt received in chair, RN/NT present assisting pt with dressing after bath given in chair, pt agreeable to therapy session. Pt eager to return to supine from sitting up in chair ~1hr, needing increased assist for pivotal transfer from chair to bed due to increased L lean today. Pt reports he is leaning to left while seated due to R flank pain but unable to self-correct left lean in stance needing modA external support in addition to RW. Pt quick to fatigue but able to perform seated/supine LE exercises and incentive spirometry today with multimodal cues. Increased time needed due to pt episode of bowel incontinence upon return to bed from chair and pt needing totalA for peri-care and minA for rolling multiple reps for linen change. Per pt this was second episode in less than an hour. Pt continues to benefit from PT services to progress toward functional mobility goals.   Recommendations for follow up therapy are one component of a multi-disciplinary discharge planning process, led by the attending physician.  Recommendations may be updated based on patient status, additional functional criteria and insurance authorization.  Follow Up Recommendations  Acute inpatient rehab (3hours/day)     Assistance Recommended at Discharge Frequent or constant Supervision/Assistance  Patient can return home with the following A lot of help with walking and/or transfers;A lot  of help with bathing/dressing/bathroom;Assistance with cooking/housework;Direct supervision/assist for medications management;Direct supervision/assist for financial management;Assist for transportation;Help with stairs or ramp for entrance   Equipment Recommendations  None recommended by PT (TBD)    Recommendations for Other Services       Precautions / Restrictions Precautions Precautions: Fall Precaution Comments: cortrak Restrictions Weight Bearing Restrictions: No     Mobility  Bed Mobility Overal bed mobility: Needs Assistance Bed Mobility: Sit to Sidelying, Rolling Rolling: Min assist       Sit to sidelying: Mod assist General bed mobility comments: increased LE assist to clear edge of bed with log roll; multiple L/R rolls (~6) due to bowel incontinence and need for peri-care and linen change.    Transfers Overall transfer level: Needs assistance Equipment used: Rolling walker (2 wheels) Transfers: Sit to/from Stand Sit to Stand: Min assist   Step pivot transfers: Mod assist       General transfer comment: Increased left head and trunk lean in standing, pt reports pain at R flank makes this posture more comfortable but increased lean as he fatigued.        Balance Overall balance assessment: Needs assistance Sitting-balance support: Feet supported, Bilateral upper extremity supported Sitting balance-Leahy Scale: Poor Sitting balance - Comments: L lean Postural control: Left lateral lean Standing balance support: During functional activity, Reliant on assistive device for balance Standing balance-Leahy Scale: Poor Standing balance comment: min to modA for dynamic standing tasks at RW, increased L lean with fatigue  Cognition Arousal/Alertness: Lethargic Behavior During Therapy: Flat affect Overall Cognitive Status: Impaired/Different from baseline Area of Impairment: Attention                   Current  Attention Level: Focused, Sustained           General Comments: Pt with slightly slower processing and more fatigued looking in this session compared to previous sessions. Pale pallor.        Exercises General Exercises - Lower Extremity Ankle Circles/Pumps: AROM, Both, Supine, 10 reps Short Arc Quad: AROM, Supine, 5 reps, Both Long Arc Quad: AROM, Both, 10 reps, Seated Straight Leg Raises: AROM, Both, 5 reps, Supine Hip Flexion/Marching: AROM, Both, 10 reps, Seated Other Exercises Other Exercises: IS x 10 reps (100-250 mL)    General Comments General comments (skin integrity, edema, etc.): BP 109/56 (72) seated in chair, BP 105/58 (70) standing at bedside, then 140/90 (101) after return to supine.      Pertinent Vitals/Pain Pain Assessment Pain Assessment: Faces Faces Pain Scale: Hurts even more Pain Location: abdomen (more to R side) Pain Descriptors / Indicators: Grimacing, Guarding, Moaning Pain Intervention(s): Limited activity within patient's tolerance, Monitored during session, Repositioned, Patient requesting pain meds-RN notified           PT Goals (current goals can now be found in the care plan section) Acute Rehab PT Goals Patient Stated Goal: to get stronger and pain reduced. PT Goal Formulation: With patient/family Time For Goal Achievement: 08/17/22 Progress towards PT goals: Progressing toward goals    Frequency    Min 3X/week      PT Plan Current plan remains appropriate       AM-PAC PT "6 Clicks" Mobility   Outcome Measure  Help needed turning from your back to your side while in a flat bed without using bedrails?: A Little Help needed moving from lying on your back to sitting on the side of a flat bed without using bedrails?: A Lot Help needed moving to and from a bed to a chair (including a wheelchair)?: A Lot Help needed standing up from a chair using your arms (e.g., wheelchair or bedside chair)?: A Lot Help needed to walk in  hospital room?: Total Help needed climbing 3-5 steps with a railing? : Total 6 Click Score: 11    End of Session Equipment Utilized During Treatment: Gait belt;Oxygen Activity Tolerance: Patient limited by fatigue Patient left: in bed;with call bell/phone within reach;with bed alarm set Nurse Communication: Mobility status PT Visit Diagnosis: Muscle weakness (generalized) (M62.81);Difficulty in walking, not elsewhere classified (R26.2);Other abnormalities of gait and mobility (R26.89);Pain Pain - Right/Left: Right     Time: 6384-5364 PT Time Calculation (min) (ACUTE ONLY): 43 min  Charges:  $Therapeutic Exercise: 8-22 mins $Therapeutic Activity: 23-37 mins                     Kennedi Lizardo P., PTA Acute Rehabilitation Services Secure Chat Preferred 9a-5:30pm Office: [place] 08/14/2022, 4:33 PM

## 2022-08-14 NOTE — TOC Progression Note (Signed)
Transition of Care (TOC) - Progression Note    Patient Details  Name: Douglas Hurst. MRN: [id number] Date of Birth: 09/10/1941  Transition of Care [facility]) CM/SW [place], LCSW Phone Number: 08/14/2022, 9:12 AM  Clinical Narrative:    CSW continuing to follow for needs and medical readiness.    Expected Discharge Plan: IP Rehab Facility Barriers to Discharge: Ship broker, Continued Medical Work up (CIR vs. snf)  Expected Discharge Plan and Services Expected Discharge Plan: [place] In-house Referral: Clinical Social Work   Post Acute Care Choice: IP Rehab Living arrangements for the past 2 months: Single Family Home                                       Social Determinants of Health (SDOH) Interventions    Readmission Risk Interventions     No data to display

## 2022-08-14 NOTE — Progress Notes (Signed)
Progress Note  Patient: Douglas Hurst. LNL:[id number] DOB: 1941/10/19  DOA: 07/30/2022  DOS: 08/14/2022    Brief hospital course: Douglas Hurst. is a 81 y.o. male with a history of HFw/recoveredEF, PAF, NIDT2DM, HTN, HLD, GERD, COPD, and acute calculous cholecystitis and choledocholithiasis admitted here 4/29, discharged with cholecystostomy tube and discharged. He had AFib w/RVR and CHF during that admit, but with rate and rhythm control, EF normalized. He had unsuccessful ERCP and subsequent laparoscopic cholecystectomy 7/23 with inability to clear the bile duct. Referred for ERCP at [facility]. [facility] ERCP was performed 8/3 and he was discharged home only to return to [facility] with abdominal pain and post-ERCP pancreatitis with imaging concerning for necrosis. Also had difficulty urinating. IVF, IV antibiotics were started, and GI consulted on admission. Hospitalization complicated by AFib w/RVR which has since converted to NSR. Urinary retention improved on higher dose tamsulosin. He failed advancing diet on 8/10, repeat CT showed enlarging perihepatic fluid collections. IR placed drain 8/15.  Assessment and Plan: Acute pancreatitis-duodenitis with contained duodenal perforation (s/p rendezvous ERCP; transduodenal approach to puncture extrahepatic bile duct to place guidewire into duodenum-subsequently ERCP scope inserted and stone removed):  - CT repeated 8/13 showing fluid collections, enlarging. Favor reactive inflammation and not necessarily pancreatic pseudocyst. Discussed with GI daily. Could consider upper endoscopy vs. gastrografin exam to r/o persistent leak. No contrast into fluid collection on CT's. Note lipase was normal on admission. No pancreatic duct involvement in preceding procedure. ?if symptoms primarily related to duodenal inflammation/puncture. Initial concern for pancreatic head necrosis not borne out with serial exams, currently appears normal. - s/p aspiration and drain  placement for perihepatic fluid collection, continues to have significant dark brown output.  - Leukocytosis resolved, afebrile, culture remains negative, has received 14 full days of IV antibiotics. We will stop zosyn and monitor for now.  - Pain control as ordered, continue fentanyl low dose patch, emphasize minimizing IV narcotics.  - s/p cortrak placed 8/16, continue TFs, may also have oatmeal, skim milk as tolerated.   PAF with RVR: RVR precipitated by acute pancreatitis.  - Remains in NSR on amiodarone, metoprolol - Continue heparin gtt, plan to transition to DOAC once durably tolerating PO and no procedure planned.   Acute hypoxic respiratory failure: Due to pleural effusions, atelectasis, improved. - Wean O2 as tolerated. If unable to wean when felt to be euvolemic, would repeat CXR, consider thoracentesis.  - Incentive spirometry.   Ambulatory dysfunction: Pt very weakened/deconditioned. - Ordered OOB qShift if possible, continue PT/OT. Current recommendation has been for CIR.    AKI: Prerenal and suspected postrenal. Resolved.  - SCr is wnl. Monitor volume status  Acute urinary retention, BPH: Resolved retention.    - Continue tamsulosin 0.'8mg'$  daily  Constipation: Resolved.  - Deescalate bowel regimen to miralax and senna once daily.   Hypophosphatemia: Supplement.  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  - Weight coming back down with initiation of lasix. Admit weight ~80kg, up to 88.6kg 8/10, since declining with good UOP. Augment lasix to BID, monitor BMP and volume status. BUN:Cr widening.     NIDT2DM: Well controlled chronically with HbA1c 5.6% July 2023.  - Continue SSI, remains at inpatient goal.    HTN:  - Continue meds   COPD: No wheezing.  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  Will monitor, With recent AFib, goal would be 4.0    Subjective: Abdominal pain stable, mod-severe, improved with pain meds though he gets tired and doesn't want to get OOB much with this. Wants to eat.   Objective: Vitals:   08/14/22 0000 08/14/22 0428 08/14/22 0442 08/14/22 1347  BP: 127/85 (!) 142/63  (!) 109/56  Pulse: 73 81  84  Resp: '17 20  14  '$ Temp: 97.9 F (36.6 C) 98.1 F (36.7 C)  98.2 F (36.8 C)  TempSrc: Oral Oral  Oral  SpO2: (!) 89% 96%  97%  Weight:   80.9 kg   Height:      Gen: 81 y.o. male in no distress Pulm: Nonlabored breathing supplemental oxygen, diminished at bases. CV: Regular rate and rhythm. No murmur, rub, or gallop. No JVD, + dependent edema. GI: Abdomen soft, modestly tender to palpation, non-distended, with normoactive bowel sounds.  Ext: Warm, no deformities Skin: No new rashes, lesions or ulcers on visualized skin. Right drain site c/d/I still with significant but less output Neuro: Alert and oriented. No focal neurological deficits. Psych: Judgement and insight appear fair. Mood euthymic & affect congruent. Behavior is appropriate.      Data Personally reviewed: CBC: Recent Labs  Lab 08/10/22 0747 08/11/22 0448 08/12/22 0505 08/13/22 0431 08/14/22 0421  WBC 10.6* 10.5 9.6 9.6 9.0  HGB 11.3* 11.0* 10.3* 10.8* 11.0*  HCT 34.0* 32.0* 31.2* 33.1* 33.2*  MCV 89.5 88.2 91.2 89.9 90.2  PLT 221 228 270 300 401   Basic Metabolic Panel: Recent Labs  Lab 08/10/22 0747 08/11/22 0448 08/12/22 0505 08/13/22 0431 08/13/22 1609 08/14/22 0421 08/14/22 1753  NA 137 136 136 137  --  136  --   K 3.1* 3.4* 3.8 3.9  --  3.7  --   CL 102 102 103 102  --  99  --   CO2 '28 29 27 27  '$ --  28  --   GLUCOSE 113* 138* 119* 114*  --  204*  --   BUN 6* 7* 8 11  --  15  --   CREATININE 0.88 0.82 0.86 0.87  --  0.79  --   CALCIUM 8.4* 8.7* 8.7* 8.7*  --  8.7*  --   MG  --   --   --  1.9 2.0 1.9 2.0  PHOS  --   --   --  3.0 2.6 2.2* 2.9    GFR: Estimated Creatinine Clearance: 84.6 mL/min (by C-G formula based on SCr of 0.79 mg/dL). Liver Function Tests: Recent Labs  Lab 08/10/22 0747  AST 17  ALT 16  ALKPHOS 73  BILITOT 0.7  PROT 5.4*  ALBUMIN 1.8*   No results for input(s): "LIPASE", "AMYLASE" in the last 168 hours. No results for input(s): "AMMONIA" in the last 168 hours. Coagulation Profile: Recent Labs  Lab 08/11/22 0941  INR 1.1   Cardiac Enzymes: No results for input(s): "CKTOTAL", "CKMB", "CKMBINDEX", "TROPONINI" in the last 168 hours. BNP (last 3 results) No results for input(s): "PROBNP" in the last 8760 hours. HbA1C: No results for input(s): "HGBA1C" in the last 72 hours. CBG: Recent Labs  Lab 08/13/22 1549 08/13/22 2048 08/14/22 0800 08/14/22 1200 08/14/22 1552  GLUCAP 149* 180* 205* 183* 213*   Lipid Profile: No results for input(s): "CHOL", "HDL", "LDLCALC", "TRIG", "CHOLHDL", "LDLDIRECT" in the last 72 hours. Thyroid Function Tests: No results for input(s): "TSH", "T4TOTAL", "FREET4", "T3FREE", "THYROIDAB" in the last 72 hours. Anemia Panel: No results  for input(s): "VITAMINB12", "FOLATE", "FERRITIN", "TIBC", "IRON", "RETICCTPCT" in the last 72 hours. Urine analysis:    Component Value Date/Time   COLORURINE YELLOW 04/25/2022 1232   APPEARANCEUR CLEAR 04/25/2022 1232   LABSPEC <1.005 (L) 04/25/2022 1232   PHURINE 6.0 04/25/2022 1232   GLUCOSEU 100 (A) 04/25/2022 1232   HGBUR TRACE (A) 04/25/2022 1232   BILIRUBINUR NEGATIVE 04/25/2022 1232   KETONESUR NEGATIVE 04/25/2022 1232   PROTEINUR NEGATIVE 04/25/2022 1232   UROBILINOGEN 4.0 (H) 10/02/2015 2135   NITRITE POSITIVE (A) 04/25/2022 1232   LEUKOCYTESUR SMALL (A) 04/25/2022 1232   Recent Results (from the past 240 hour(s))  Aerobic/Anaerobic Culture w Gram Stain (surgical/deep wound)     Status: None (Preliminary result)   Collection Time: 08/11/22  4:42 PM   Specimen: Abscess  Result Value Ref Range Status   Specimen  Description ABSCESS  Final   Special Requests LIVER  Final   Gram Stain NO ORGANISMS SEEN NO WBC SEEN   Final   Culture   Final    NO GROWTH 3 DAYS NO ANAEROBES ISOLATED; CULTURE IN PROGRESS FOR 5 DAYS Performed at [facility], [place] [address], [place], [place] [address]    Report Status PENDING  Incomplete     No results found.  Family Communication: Spouse at bedside today.  Disposition: Status is: Inpatient Remains inpatient appropriate because: Persistent abdominal pain Planned Discharge Destination: CIR  Patrecia Pour, MD 08/14/2022 6:51 PM Page by [email or web address]

## 2022-08-14 NOTE — Progress Notes (Signed)
Douglas Hurst. 10:24 AM  Subjective: Patient seen and examined and case discussed with his wife and he is definitely doing better not having abdominal pain today reading the paper still would eat some oatmeal tolerating tube feeds no new complaints  Objective: Vital signs stable afebrile no acute distress abdomen is soft nontender decreasing bloody fluid in the bag hopefully this can be removed soon which is definitely playing a role with some of his discomfort labs okay  Assessment: Improved  Plan: Continue present management we will check on this weekend and will allow him to eat oatmeal with skim milk twice a day along with his tube feeds  [place]  office [phone] After 5PM or if no answer call [phone]

## 2022-08-14 NOTE — Progress Notes (Signed)
Inpatient Rehab Admissions Coordinator:   Requiring ongoing medical management; looks like plan to reassess early next week.  I will follow up Monday or Tuesday.   Shann Medal, PT, DPT Admissions Coordinator [phone] 08/14/22  11:09 AM

## 2022-08-14 NOTE — Progress Notes (Signed)
ANTICOAGULATION CONSULT NOTE - Follow Up Consult  Pharmacy Consult for Heparin Indication: atrial fibrillation  No Known Allergies  Patient Measurements: Height: '6\' 1"'$  (185.4 cm) Weight: 80.9 kg (178 lb 5.6 oz) IBW/kg (Calculated) : 79.9 Heparin Dosing Weight: 80 kg  Vital Signs: Temp: 98.1 F (36.7 C) (08/18 0428) Temp Source: Oral (08/18 0428) BP: 142/63 (08/18 0428) Pulse Rate: 81 (08/18 0428)  Labs: Recent Labs    08/11/22 0941 08/12/22 0505 08/12/22 0505 08/13/22 0431 08/14/22 0421  HGB  --  10.3*   < > 10.8* 11.0*  HCT  --  31.2*  --  33.1* 33.2*  PLT  --  270  --  300 333  LABPROT 14.3  --   --   --   --   INR 1.1  --   --   --   --   HEPARINUNFRC  --  0.37  --  0.37 0.30  CREATININE  --  0.86  --  0.87  --    < > = values in this interval not displayed.     Estimated Creatinine Clearance: 77.8 mL/min (by C-G formula based on SCr of 0.87 mg/dL).  Assessment: 81 y/o male whose pertinent medication history includes Afib , on Eliquis prior to  admission and CHF. Pharmacy has been consulted for IV heparin dosing, no bolus per MD. PTA Eliquis is on hold. His last dose of Eliquis was on 07/26/22 PTA.  Heparin level remains therapeutic on  Heparin infusion 1500 units/hr.   Hgb stable 11s -10s. Platelet count  within normal /stable.  No bleeding reported. MD plans to transition back to Eliquis once durably tolerating PO and no procedure planned.   Goal of Therapy:  Heparin level 0.3-0.7 units/ml Monitor platelets by anticoagulation protocol: Yes   Plan:  Continue IV heparin drip to 1500 units/hr Daily HL and CBC Follow up for plans to transition back to Eliquis once tolerating PO and no procedures planned.  Kerby Borner A. Levada Dy, PharmD, BCPS, FNKF Clinical Pharmacist [place] Please utilize Amion for appropriate phone number to reach the unit pharmacist ([place])

## 2022-08-14 NOTE — Progress Notes (Signed)
Nutrition Follow-up  DOCUMENTATION CODES:   Severe malnutrition in context of chronic illness  INTERVENTION:   Tube Feeds via Cortrak: Vital 1.5 at 60 mL/hr (1440 mL/day) 60 mL ProSource TF 20 - daily 150 mL free water flush q4h  Provides 2240 kcal, 117 gm of protein, and 2000 mL free water daily.   NUTRITION DIAGNOSIS:   Severe Malnutrition related to chronic illness (COPD, HF, acute pancreatitis) as evidenced by severe fat depletion, moderate fat depletion, severe muscle depletion, moderate muscle depletion. - Ongoing   GOAL:   Patient will meet greater than or equal to 90% of their needs - Being met via TF  MONITOR:   Diet advancement, Labs, Weight trends, I & O's, TF tolerance  REASON FOR ASSESSMENT:   Consult Enteral/tube feeding initiation and management  ASSESSMENT:   Pt admitted with abdomina pain s/p ERCP, found to have acute pancreatitis. PMH significant for bipolar d/o, T2DM, HTN, HLD, GERD, COPD, Afib, dHF, AAA, interim h/o acute cholecystitis. S/p laparoscopic cholecystectomy with laparoscopic common duct exploration 7/23  8/11 - diet advanced to full liquids 8/14 - diet advanced to SOFT diet  8/15 - s/p CT guided abdominal drain placement; diet downgraded to full liquids 8/16 - NPO; Cortrak placed (tip near distal pylorus/proximal duodenum; TF started   Pt sleeping at time of RD visit, wife at bedside. Wife reports that pt has been doing better. States that he has not had any nausea or vomiting. Denies any pain related to tube feeds.   Reached out to GI doctor, GI allowing oatmeal twice per day.   Medications reviewed and include: Lasix, NovoLog, Miralax, Senokot-S Labs reviewed: Phosphorus 2.2, 24 hr CBG 149-205   Diet Order:   Diet Order             Diet NPO time specified Except for: Sips with Meds, Ice Chips  Diet effective now                   EDUCATION NEEDS:   Education needs have been addressed  Skin:  Skin Assessment: Skin  Integrity Issues: Skin Integrity Issues:: Other (Comment) Other: RU abdomen drain  Last BM:  8/14  Height:   Ht Readings from Last 1 Encounters:  07/30/22 6' 1"  (1.854 m)    Weight:   Wt Readings from Last 1 Encounters:  08/14/22 80.9 kg    Ideal Body Weight:  83.6 kg  BMI:  Body mass index is 23.53 kg/m.  Estimated Nutritional Needs:   Kcal:  2200-2400  Protein:  115-130g  Fluid:  >/=2L    Hermina Barters RD, LDN Clinical Dietitian See [facility] for contact information.

## 2022-08-14 NOTE — Progress Notes (Signed)
Occupational Therapy Treatment Patient Details Name: Douglas Hurst. MRN: [id number] DOB: 01-24-1941 Today's Date: 08/14/2022   History of present illness Patient is a 81 yo male presenting to the ED with severe abdominal pain with radiation to right shoulder on 07/31/22. Patient had underwent ECRP for removal of stones from biliary and pancreatic duct on 8/3. Admitted with acute pancreatitis. PMH includes: bipolar d/o DMII, HTN, HLD, GERD, COPD ,  Afib ,diastolic heart failure, AAA with interim history of acute cholecystitis.   OT comments  Pt currently more fatigued this session compared to previous session.  He did participate in selfcare tasks at EOB with transfer to the recliner at min assist level.  He exhibited increased head tilt and lean to the left in sitting with LOB posteriorly and left as well increased lean to the left with standing.  No noted difficulty with UE use during bathing tasks or with holding onto the RW.  Nursing made aware of left lean.  Feel pt will continue to benefit from acute care OT with transition to AIR for more intense therapy.     Recommendations for follow up therapy are one component of a multi-disciplinary discharge planning process, led by the attending physician.  Recommendations may be updated based on patient status, additional functional criteria and insurance authorization.    Follow Up Recommendations  Acute inpatient rehab (3hours/day)    Assistance Recommended at Discharge Frequent or constant Supervision/Assistance  Patient can return home with the following  A little help with walking and/or transfers;A little help with bathing/dressing/bathroom;Assistance with cooking/housework;Assist for transportation;Help with stairs or ramp for entrance;Direct supervision/assist for medications management   Equipment Recommendations  Other (comment) (TBD next venue of care)    Recommendations for Other Services      Precautions / Restrictions  Precautions Precautions: Fall Precaution Comments: cortrak Restrictions Weight Bearing Restrictions: No       Mobility Bed Mobility Overal bed mobility: Needs Assistance Bed Mobility: Supine to Sit     Supine to sit: Mod assist, HOB elevated (HOB 37 Degrees secondary to cortrak)     General bed mobility comments: Pt needed assist with scooting hips out to the edge of the bed and for transition to sitting with trunk.    Transfers Overall transfer level: Needs assistance Equipment used: Rolling walker (2 wheels) Transfers: Sit to/from Stand Sit to Stand: Min assist     Step pivot transfers: Min assist     General transfer comment: Increased left head and trunk lean in standing.     Balance Overall balance assessment: Needs assistance Sitting-balance support: Feet supported, Bilateral upper extremity supported Sitting balance-Leahy Scale: Poor Sitting balance - Comments: frequent posterior LOB sitting on the EOB statically   Standing balance support: During functional activity, Reliant on assistive device for balance Standing balance-Leahy Scale: Poor                             ADL either performed or assessed with clinical judgement   ADL Overall ADL's : Needs assistance/impaired     Grooming: Wash/dry face;Wash/dry hands;Minimal assistance;Sitting Grooming Details (indicate cue type and reason): unsupported EOB Upper Body Bathing: Minimal assistance;Sitting Upper Body Bathing Details (indicate cue type and reason): unsupported EOB Lower Body Bathing: Moderate assistance;Sit to/from stand   Upper Body Dressing : Moderate assistance;Sitting Upper Body Dressing Details (indicate cue type and reason): hospital gown  Functional mobility during ADLs: Minimal assistance;Rolling walker (2 wheels) (stand pivot to the bedside chair) General ADL Comments: Pt with decreased sitting balance today compared to previous sessions.  Mod assist  for static sitting unsupported secondary to left head tilt and left lean.  This was also present in standing. No noted weakness or ataxia in the RUE with functional use and pt appearing more tired than last session with soft voice noted to questions.  Vitals stable overall throughout session on with Oxygen at 92% on 2Ls O2 via nasal cannula.  They decreased down to 88% on room air during transfer to the recliner.               Cognition Arousal/Alertness: Lethargic Behavior During Therapy: Flat affect Overall Cognitive Status: Impaired/Different from baseline Area of Impairment: Attention                   Current Attention Level: Focused, Sustained           General Comments: Pt with slightly slower processing and more fatigued looking in this session compared to previous sessions.                   Pertinent Vitals/ Pain       Pain Assessment Pain Assessment: No/denies pain         Frequency  Min 3X/week        Progress Toward Goals  OT Goals(current goals can now be found in the care plan section)  Progress towards OT goals: Progressing toward goals  Acute Rehab OT Goals OT Goal Formulation: With patient Time For Goal Achievement: 08/17/22 Potential to Achieve Goals: Good  Plan Discharge plan remains appropriate       AM-PAC OT "6 Clicks" Daily Activity     Outcome Measure   Help from another person eating meals?: None Help from another person taking care of personal grooming?: A Little Help from another person toileting, which includes using toliet, bedpan, or urinal?: A Lot Help from another person bathing (including washing, rinsing, drying)?: A Lot Help from another person to put on and taking off regular upper body clothing?: A Lot Help from another person to put on and taking off regular lower body clothing?: A Lot 6 Click Score: 15    End of Session Equipment Utilized During Treatment: Gait belt;Rolling walker (2 wheels)  OT Visit  Diagnosis: Unsteadiness on feet (R26.81);Muscle weakness (generalized) (M62.81)   Activity Tolerance Patient tolerated treatment well   Patient Left in chair;with call bell/phone within reach;with chair alarm set   Nurse Communication Mobility status;Other (comment) (pts left lean)        Time: 2876-8115 OT Time Calculation (min): 35 min  Charges: OT General Charges $OT Visit: 1 Visit OT Treatments $Self Care/Home Management : 23-37 mins  Zoiey Christy OTR/L 08/14/2022, 3:48 PM

## 2022-08-15 DIAGNOSIS — K858 Other acute pancreatitis without necrosis or infection: Secondary | ICD-10-CM | POA: Diagnosis not present

## 2022-08-15 DIAGNOSIS — E43 Unspecified severe protein-calorie malnutrition: Secondary | ICD-10-CM | POA: Diagnosis not present

## 2022-08-15 LAB — CBC
HCT: 32.2 % — ABNORMAL LOW (ref 39.0–52.0)
Hemoglobin: 10.5 g/dL — ABNORMAL LOW (ref 13.0–17.0)
MCH: 29.6 pg (ref 26.0–34.0)
MCHC: 32.6 g/dL (ref 30.0–36.0)
MCV: 90.7 fL (ref 80.0–100.0)
Platelets: 352 10*3/uL (ref 150–400)
RBC: 3.55 MIL/uL — ABNORMAL LOW (ref 4.22–5.81)
RDW: 15.6 % — ABNORMAL HIGH (ref 11.5–15.5)
WBC: 9.1 10*3/uL (ref 4.0–10.5)
nRBC: 0 % (ref 0.0–0.2)

## 2022-08-15 LAB — BASIC METABOLIC PANEL
Anion gap: 8 (ref 5–15)
BUN: 15 mg/dL (ref 8–23)
CO2: 30 mmol/L (ref 22–32)
Calcium: 8.7 mg/dL — ABNORMAL LOW (ref 8.9–10.3)
Chloride: 97 mmol/L — ABNORMAL LOW (ref 98–111)
Creatinine, Ser: 0.72 mg/dL (ref 0.61–1.24)
GFR, Estimated: >60 mL/min (ref >60)
Glucose, Bld: 194 mg/dL — ABNORMAL HIGH (ref 70–99)
Potassium: 3.8 mmol/L (ref 3.5–5.1)
Sodium: 135 mmol/L (ref 135–145)

## 2022-08-15 LAB — GLUCOSE, CAPILLARY
Glucose-Capillary: 198 mg/dL — ABNORMAL HIGH (ref 70–99)
Glucose-Capillary: 199 mg/dL — ABNORMAL HIGH (ref 70–99)
Glucose-Capillary: 213 mg/dL — ABNORMAL HIGH (ref 70–99)
Glucose-Capillary: 253 mg/dL — ABNORMAL HIGH (ref 70–99)

## 2022-08-15 LAB — PHOSPHORUS
Phosphorus: 2.6 mg/dL (ref 2.5–4.6)
Phosphorus: 2.5 mg/dL (ref 2.5–4.6)

## 2022-08-15 LAB — HEPARIN LEVEL (UNFRACTIONATED): Heparin Unfractionated: 0.38 IU/mL (ref 0.30–0.70)

## 2022-08-15 LAB — MAGNESIUM
Magnesium: 1.8 mg/dL (ref 1.7–2.4)
Magnesium: 1.8 mg/dL (ref 1.7–2.4)

## 2022-08-15 MED ORDER — FUROSEMIDE 10 MG/ML IJ SOLN
60.0000 mg | Freq: Two times a day (BID) | INTRAMUSCULAR | Status: DC
  Administered 2022-08-15 – 2022-08-16 (×4): 60 mg via INTRAVENOUS
  Filled 2022-08-15 (×4): qty 6

## 2022-08-15 NOTE — Progress Notes (Signed)
Progress Note  Patient: Douglas Hurst. GUY:[id number] DOB: [birth year]  DOA: 07/30/2022  DOS: 08/15/2022    Brief hospital course: Douglas Hurst. is a 81 y.o. male with a history of HFw/recoveredEF, PAF, NIDT2DM, HTN, HLD, GERD, COPD, and acute calculous cholecystitis and choledocholithiasis admitted here 4/29, discharged with cholecystostomy tube and discharged. He had AFib w/RVR and CHF during that admit, but with rate and rhythm control, EF normalized. He had unsuccessful ERCP and subsequent laparoscopic cholecystectomy 7/23 with inability to clear the bile duct. Referred for ERCP at [facility]. Rendezvous ERCP was performed 8/3 and he was discharged home only to return to [facility] with abdominal pain and post-ERCP pancreatitis with imaging concerning for necrosis. Also had difficulty urinating. IVF, IV antibiotics were started, and GI consulted on admission. Hospitalization complicated by AFib w/RVR which has since converted to NSR. Urinary retention improved on higher dose tamsulosin. He failed advancing diet on 8/10, repeat CT showed enlarging perihepatic fluid collections. IR placed drain 8/15.  Assessment and Plan: Acute pancreatitis-duodenitis with contained duodenal perforation (s/p rendezvous ERCP; transduodenal approach to puncture extrahepatic bile duct to place guidewire into duodenum-subsequently ERCP scope inserted and stone removed):  - CT repeated 8/13 showing fluid collections, enlarging. Favor reactive inflammation and not necessarily pancreatic pseudocyst. Discussed with GI daily. Could consider upper endoscopy vs. gastrografin exam to r/o persistent leak. No contrast into fluid collection on CT's. Note lipase was normal on admission. No pancreatic duct involvement in preceding procedure. ?if symptoms primarily related to duodenal inflammation/puncture. Initial concern for pancreatic head necrosis not borne out with serial exams, currently appears normal. - s/p aspiration and drain  placement for perihepatic fluid collection 8/15, output declining 200cc > 375cc > 75cc > 60cc. Per IR, repeat CT once output is 10cc/24hrs. Culture negative. - Leukocytosis resolved, afebrile, culture remains negative, received 14 full days of IV antibiotics.   - Pain control as ordered, continue fentanyl low dose patch, emphasize minimizing IV narcotics.  - s/p cortrak placed 8/16, continue TFs, may also have oatmeal, skim milk as tolerated BID per GI.   PAF with RVR: RVR precipitated by acute pancreatitis.  - Remains in NSR on amiodarone, metoprolol - Continue heparin gtt, plan to transition to DOAC once durably tolerating PO and no procedure planned.   Acute hypoxic respiratory failure: Due to pleural effusions, atelectasis, improved. - Wean O2 as tolerated. Augment diuresis as below. If unable to wean when felt to be euvolemic, would repeat CXR, consider thoracentesis.  - Incentive spirometry.   Ambulatory dysfunction: Pt very weakened/deconditioned. - Ordered OOB qShift if possible, continue PT/OT. Current recommendation has been for CIR.    AKI: Prerenal and suspected postrenal. Resolved.  - SCr is wnl. Monitor volume status  Acute urinary retention, BPH: Resolved retention.    - Continue tamsulosin 0.'8mg'$  daily  Constipation: Resolved.  - Deescalate bowel regimen to miralax and senna once daily.   Hypophosphatemia: Supplement.  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  - Weight coming back down with initiation of lasix. Admit weight ~80kg, up to 88.6kg 8/10, since declining. UOP down < 1L. Monitor BMP and volume status. BUN:Cr stable, wnl.    NIDT2DM: Well controlled chronically with HbA1c 5.6% July 2023.  - Continue SSI   HTN:  - Continue meds   COPD: No wheezing.  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 Will monitor, With recent AFib, goal would be 4.0    Subjective: Oatmeal did not cause worse abdominal pain. Has some shortness of breath still. No new complaints.   Objective: Vitals:   08/14/22 2300 08/14/22 2339 08/15/22 0300 08/15/22 0800  BP: (!) 117/55 (!) 88/65 (!) 108/56 133/62  Pulse: 71 72 76 86  Resp: '16 15 17 15  '$ Temp: 98.1 F (36.7 C) 97.6 F (36.4 C) 97.8 F (36.6 C) 97.6 F (36.4 C)  TempSrc: Oral Oral Oral Oral  SpO2: 94% 94% 94% 94%  Weight:      Height:      Gen: 81 y.o. male in no distress Pulm: Nonlabored breathing with nasal cannula oxygen. Remains diminished at bases. CV: Regular rate and rhythm. No murmur, rub, or gallop. No JVD, mild dependent edema. GI: Abdomen soft, diffusely tender without rebound or guarding, no masses, non-distended, with normoactive bowel sounds.  Ext: Warm, no deformities Skin: No jaundice or new rashes, lesions or ulcers on visualized skin. Drain site in RUQ c/d/i Neuro: Alert and oriented. No focal neurological deficits. Psych: Judgement and insight appear fair. Mood euthymic & affect congruent. Behavior is appropriate.        Data Personally reviewed: CBC: Recent Labs  Lab 08/11/22 0448 08/12/22 0505 08/13/22 0431 08/14/22 0421 08/15/22 0431  WBC 10.5 9.6 9.6 9.0 9.1  HGB 11.0* 10.3* 10.8* 11.0* 10.5*  HCT 32.0* 31.2* 33.1* 33.2* 32.2*  MCV 88.2 91.2 89.9 90.2 90.7  PLT 228 270 300 333 130   Basic Metabolic Panel: Recent Labs  Lab 08/11/22 0448 08/12/22 0505 08/13/22 0431 08/13/22 1609 08/14/22 0421 08/14/22 1753 08/15/22 0431  NA 136 136 137  --  136  --  135  K 3.4* 3.8 3.9  --  3.7  --  3.8  CL 102 103 102  --  99  --  97*  CO2 '29 27 27  '$ --  28  --  30  GLUCOSE 138* 119* 114*  --  204*  --  194*  BUN 7* 8 11  --  15  --  15  CREATININE 0.82 0.86 0.87  --  0.79  --  0.72  CALCIUM 8.7* 8.7* 8.7*  --  8.7*  --  8.7*  MG  --   --  1.9 2.0 1.9 2.0 1.8  PHOS  --   --  3.0  2.6 2.2* 2.9 2.6   GFR: Estimated Creatinine Clearance: 84.6 mL/min (by C-G formula based on SCr of 0.72 mg/dL). Liver Function Tests: Recent Labs  Lab 08/10/22 0747  AST 17  ALT 16  ALKPHOS 73  BILITOT 0.7  PROT 5.4*  ALBUMIN 1.8*   Coagulation Profile: Recent Labs  Lab 08/11/22 0941  INR 1.1   CBG: Recent Labs  Lab 08/14/22 0800 08/14/22 1200 08/14/22 1552 08/14/22 2212 08/15/22 0816  GLUCAP 205* 183* 213* 202* 198*   Urine analysis:    Component Value Date/Time   COLORURINE YELLOW 04/25/2022 Maria Antonia 04/25/2022 1232   LABSPEC <1.005 (L) 04/25/2022 1232   PHURINE 6.0 04/25/2022 1232   GLUCOSEU 100 (A) 04/25/2022 1232   HGBUR TRACE (A) 04/25/2022 1232   BILIRUBINUR NEGATIVE 04/25/2022 1232   KETONESUR NEGATIVE 04/25/2022 1232   PROTEINUR NEGATIVE 04/25/2022 1232   UROBILINOGEN 4.0 (H) 10/02/2015 2135   NITRITE POSITIVE (A) 04/25/2022 1232   LEUKOCYTESUR SMALL (A) 04/25/2022 1232   Recent Results (from the past 240 hour(s))  Aerobic/Anaerobic Culture w Gram Stain (  surgical/deep wound)     Status: None (Preliminary result)   Collection Time: 08/11/22  4:42 PM   Specimen: Abscess  Result Value Ref Range Status   Specimen Description ABSCESS  Final   Special Requests LIVER  Final   Gram Stain NO ORGANISMS SEEN NO WBC SEEN   Final   Culture   Final    NO GROWTH 4 DAYS NO ANAEROBES ISOLATED; CULTURE IN PROGRESS FOR 5 DAYS Performed at [facility], [address] [place], [place] [address]    Report Status PENDING  Incomplete     No results found.  Family Communication: None at bedside this AM.  Disposition: Status is: Inpatient Remains inpatient appropriate because: Persistent abdominal pain Planned Discharge Destination: CIR  Patrecia Pour, MD 08/15/2022 9:21 AM Page by [email or web address]

## 2022-08-15 NOTE — Progress Notes (Signed)
Physical Therapy Treatment Patient Details Name: Douglas Hurst. MRN: [id number] DOB: 1941/10/09 Today's Date: 08/15/2022   History of Present Illness Patient is a 81 yo male presenting to the ED with severe abdominal pain with radiation to right shoulder on 07/31/22. Patient had underwent ECRP for removal of stones from biliary and pancreatic duct on 8/3. Admitted with acute pancreatitis. PMH includes: bipolar d/o DMII, HTN, HLD, GERD, COPD ,  Afib ,diastolic heart failure, AAA with interim history of acute cholecystitis.    PT Comments    Pt received supine and agreeable to session with encouragement, however pt limited by increased fatigue. Pt found have bowel incontinence upon initiation of bed mobility, pt needing min assist to roll R/L for pericare and mod assist to elevate trunk to come to sitting EOB. Pt able to come to standing with min assist and take small steps away from EOB. Pt continued to have L lateral lean with increased fatigue this session needing mod assist and multimodal cues. Pt continues to benefit from skilled PT services to progress toward functional mobility goals.    Recommendations for follow up therapy are one component of a multi-disciplinary discharge planning process, led by the attending physician.  Recommendations may be updated based on patient status, additional functional criteria and insurance authorization.  Follow Up Recommendations  Acute inpatient rehab (3hours/day)     Assistance Recommended at Discharge Frequent or constant Supervision/Assistance  Patient can return home with the following A lot of help with walking and/or transfers;A lot of help with bathing/dressing/bathroom;Assistance with cooking/housework;Direct supervision/assist for medications management;Direct supervision/assist for financial management;Assist for transportation;Help with stairs or ramp for entrance   Equipment Recommendations  None recommended by PT (TBD)     Recommendations for Other Services       Precautions / Restrictions Precautions Precautions: Fall Precaution Comments: cortrak Restrictions Weight Bearing Restrictions: No     Mobility  Bed Mobility Overal bed mobility: Needs Assistance Bed Mobility: Rolling, Sidelying to Sit Rolling: Min assist Sidelying to sit: Mod assist       General bed mobility comments: min assist to roll R/L for peri care, mod asssit to elevate trunk from sidelying to sit    Transfers Overall transfer level: Needs assistance Equipment used: Rolling walker (2 wheels) Transfers: Sit to/from Stand Sit to Stand: Min assist   Step pivot transfers: Mod assist       General transfer comment: able to take small steps away from EOB to recliner, increased lean with fatigue, pt declining further ambulation trials/attempts    Ambulation/Gait                   Stairs             Wheelchair Mobility    Modified Rankin (Stroke Patients Only)       Balance Overall balance assessment: Needs assistance Sitting-balance support: Feet supported, Bilateral upper extremity supported Sitting balance-Leahy Scale: Fair   Postural control: Left lateral lean Standing balance support: During functional activity, Reliant on assistive device for balance Standing balance-Leahy Scale: Poor Standing balance comment: min to modA for dynamic standing tasks at RW, increased L lean with fatigue                            Cognition Arousal/Alertness: Lethargic Behavior During Therapy: Flat affect Overall Cognitive Status: Impaired/Different from baseline Area of Impairment: Attention  Current Attention Level: Focused, Sustained           General Comments: Pt with slightly slower processing and fatigued looking in this session compared. Pale pallor.        Exercises General Exercises - Lower Extremity Ankle Circles/Pumps: AROM, Both, Supine, 10 reps     General Comments General comments (skin integrity, edema, etc.): VSS on 2L [place]      Pertinent Vitals/Pain Pain Assessment Pain Assessment: Faces Faces Pain Scale: Hurts even more Pain Location: nose Pain Descriptors / Indicators: Grimacing, Guarding Pain Intervention(s): Monitored during session    Home Living                          Prior Function            PT Goals (current goals can now be found in the care plan section) Acute Rehab PT Goals Patient Stated Goal: to get stronger and pain reduced. PT Goal Formulation: With patient/family Time For Goal Achievement: 08/17/22    Frequency    Min 3X/week      PT Plan Current plan remains appropriate    Co-evaluation              AM-PAC PT "6 Clicks" Mobility   Outcome Measure  Help needed turning from your back to your side while in a flat bed without using bedrails?: A Little Help needed moving from lying on your back to sitting on the side of a flat bed without using bedrails?: A Lot Help needed moving to and from a bed to a chair (including a wheelchair)?: A Lot Help needed standing up from a chair using your arms (e.g., wheelchair or bedside chair)?: A Lot Help needed to walk in hospital room?: Total Help needed climbing 3-5 steps with a railing? : Total 6 Click Score: 11    End of Session Equipment Utilized During Treatment: Gait belt;Oxygen Activity Tolerance: Patient limited by fatigue Patient left: with call bell/phone within reach;in chair;with family/visitor present Nurse Communication: Mobility status PT Visit Diagnosis: Muscle weakness (generalized) (M62.81);Difficulty in walking, not elsewhere classified (R26.2);Other abnormalities of gait and mobility (R26.89);Pain Pain - Right/Left: Right     Time: 0973-5329 PT Time Calculation (min) (ACUTE ONLY): 28 min  Charges:  $Therapeutic Activity: 23-37 mins                     Sheneka Schrom R. PTA Acute Rehabilitation Services Office:  [place] 08/15/2022, 3:06 PM

## 2022-08-15 NOTE — Progress Notes (Signed)
Douglas Hurst. 11:46 AM  Subjective: Patient doing fine without any obvious change and drainage about the same and he does not like the tube in his nose and we had a very long conversation with the patient and the family multiple members regarding his diet and the frustrations with the computer and the hospital policies and my involvement we answered all of their questions  Objective: Vital signs stable afebrile no acute distress abdomen is soft nontender labs stable  Assessment: ERCP complication with fluid collection and failure to thrive  Plan: After our long conversation and multiple calls and epic chat from nurses and dietitians yesterday I think the best way to proceed is to put him on a regular diet like the patient and the wife and the family pick his foods which will include oatmeal with every meal and he will probably not eat anything else but they can at least choose things like eggs and cream vegetables which he possibly likes and we will hold tube feeds for 3 hours if he eats more than 50% and please do not call me to discuss this but I will be on rounds tomorrow and happy to answer all questions at that time  [facility]  office [phone] After 5PM or if no answer call [phone]

## 2022-08-15 NOTE — Progress Notes (Signed)
ANTICOAGULATION CONSULT NOTE - Follow Up Consult  Pharmacy Consult for Heparin Indication: atrial fibrillation  No Known Allergies  Patient Measurements: Height: '6\' 1"'$  (185.4 cm) Weight: 80.9 kg (178 lb 5.6 oz) IBW/kg (Calculated) : 79.9 Heparin Dosing Weight: 80 kg  Vital Signs: Temp: 97.8 F (36.6 C) (08/19 0300) Temp Source: Oral (08/19 0300) BP: 108/56 (08/19 0300) Pulse Rate: 76 (08/19 0300)  Labs: Recent Labs    08/13/22 0431 08/14/22 0421 08/15/22 0431  HGB 10.8* 11.0* 10.5*  HCT 33.1* 33.2* 32.2*  PLT 300 333 352  HEPARINUNFRC 0.37 0.30 0.38  CREATININE 0.87 0.79 0.72     Estimated Creatinine Clearance: 84.6 mL/min (by C-G formula based on SCr of 0.72 mg/dL).  Assessment: Douglas Hurst is a 81 y/o male whose pertinent medication history includes Afib (on Eliquis prior to  admission) and CHF. Pharmacy has been consulted for IV heparin dosing, no bolus per MD. PTA Eliquis is on hold. His last dose of Eliquis was on 07/26/22 PTA.  Heparin level remains therapeutic at 0.38 IU/mL.  Hgb stable. Platelet count within normal /stable.  RN notes no bleeding concerns or issues with the line.    Goal of Therapy:  Heparin level 0.3-0.7 units/ml Monitor platelets by anticoagulation protocol: Yes   Plan:  Continue IV heparin drip at 1500 units/hr Monitor with daily HL and CBC Follow up for plans to transition back to Eliquis once tolerating PO and no procedures planned.  Vicenta Dunning, PharmD  PGY1 Pharmacy Resident

## 2022-08-15 NOTE — Progress Notes (Signed)
Referring Physician(s): Dr Mathews Robinsons Dr Cleaster Corin   Supervising Physician: Corrie Mckusick  Patient Status:  [facility] - In-pt  Chief Complaint:  Perihepatic collection drain  Subjective:  Asleep; no complaints Denies pain Groggy   Allergies: Patient has no known allergies.  Medications: Prior to Admission medications   Medication Sig Start Date End Date Taking? Authorizing Provider  acetaminophen (TYLENOL) 325 MG tablet Take 2 tablets (650 mg total) by mouth every 6 (six) hours as needed for mild pain (or Fever >/= 101). 04/30/22  Yes Sheikh, Omair Latif, DO  amiodarone (PACERONE) 200 MG tablet Take 1 tablet (200 mg total) by mouth daily. 05/29/22  Yes Loel Dubonnet, NP  atorvastatin (LIPITOR) 40 MG tablet Take 40 mg by mouth every evening.   Yes [provider]  lisinopril-hydrochlorothiazide (ZESTORETIC) 10-12.5 MG tablet Take 0.5 tablets by mouth daily.   Yes [provider]  metFORMIN (GLUCOPHAGE-XR) 500 MG 24 hr tablet Take 500-1,000 mg by mouth See admin instructions. Take 2 tablets (1000 mg) by mouth in the morning and 1 tablet (500 mg) every evening 03/17/22  Yes [provider]  metoprolol tartrate (LOPRESSOR) 25 MG tablet Take 1 tablet (25 mg total) by mouth 2 (two) times daily. 05/29/22  Yes Loel Dubonnet, NP  Multiple Vitamin (MULTIVITAMIN WITH MINERALS) TABS tablet Take 1 tablet by mouth daily.   Yes [provider]  ondansetron (ZOFRAN) 4 MG tablet Take 1 tablet (4 mg total) by mouth every 6 (six) hours as needed for nausea. 04/30/22  Yes Sheikh, Omair Latif, DO  pantoprazole (PROTONIX) 40 MG tablet Take 40 mg by mouth every morning.   Yes [provider]  polyethylene glycol powder (GLYCOLAX/MIRALAX) 17 GM/SCOOP powder Take 17 g by mouth daily. Patient taking differently: Take 17 g by mouth daily as needed for mild constipation or moderate constipation. 04/30/22  Yes Sheikh, Omair Latif, DO  senna-docusate (SENOKOT-S) 8.6-50 MG  tablet Take 1 tablet by mouth at bedtime. Patient taking differently: Take 1 tablet by mouth at bedtime as needed for mild constipation or moderate constipation. 04/30/22  Yes Sheikh, Omair Latif, DO  SPIRIVA RESPIMAT 2.5 MCG/ACT AERS Inhale 2 puffs into the lungs daily as needed (COPD). 06/04/22  Yes [provider]  tadalafil (CIALIS) 20 MG tablet Take 20 mg by mouth every three (3) days as needed for erectile dysfunction. 03/29/22  Yes [provider]  tamsulosin (FLOMAX) 0.4 MG CAPS capsule Take 0.4 mg by mouth daily after supper. 05/13/22  Yes [provider]  apixaban (ELIQUIS) 5 MG TABS tablet Take 1 tablet (5 mg total) by mouth 2 (two) times daily. Patient not taking: Reported on 07/31/2022 05/29/22   Loel Dubonnet, NP  oxyCODONE-acetaminophen (PERCOCET) 5-325 MG tablet Take 1 tablet by mouth every 4 (four) hours as needed for severe pain. Patient not taking: Reported on 07/31/2022 07/14/22 07/14/23  Norm Parcel, PA-C     Vital Signs: BP (!) 108/56 (BP Location: Right Arm)   Pulse 76   Temp 97.8 F (36.6 C) (Oral)   Resp 17   Ht '6\' 1"'$  (1.854 m)   Wt 178 lb 5.6 oz (80.9 kg)   SpO2 94%   BMI 23.53 kg/m   Physical Exam Vitals reviewed.  Constitutional:      Appearance: He is ill-appearing.  Skin:    General: Skin is warm.     Comments: Drain is intact OP blood tinged: 60 cc yesterday; 30 cc in bag NO  growth  Flushes easily Clean and dry No bleeding; no sign of infection      Imaging: DG Abd Portable 1V  Result Date: 08/12/2022 CLINICAL DATA:  Feeding tube placement EXAM: PORTABLE ABDOMEN - 1 VIEW COMPARISON:  August 09, 2022 FINDINGS: The tip of the enteric tube projects over the expected region of the pylorus/proximal duodenum. Nonobstructive bowel gas pattern. Coils overlying the pelvis. Of note, the lateral aspect of the left hemiabdomen is excluded from the field of view. No acute osseous abnormality. IMPRESSION: The enteric tube tip is near  the distal pylorus/proximal duodenum. Electronically Signed   By: Beryle Flock M.D.   On: 08/12/2022 11:49   CT GUIDED VISCERAL FLUID DRAIN BY PERC CATH  Result Date: 08/12/2022 INDICATION: 81 year old male with history of recent pancreatitis and subcapsular right hepatic fluid collection concerning for abscess, possible biloma. EXAM: CT IMAGE GUIDED DRAINAGE BY PERCUTANEOUS CATHETER COMPARISON:  08/09/2022 MEDICATIONS: The patient is currently admitted to the hospital and receiving intravenous antibiotics. The antibiotics were administered within an appropriate time frame prior to the initiation of the procedure. ANESTHESIA/SEDATION: Moderate (conscious) sedation was employed during this procedure. A total of Versed 1.5 mg and Fentanyl 50 mcg was administered intravenously. Moderate Sedation Time: 11 minutes. The patient's level of consciousness and vital signs were monitored continuously by radiology nursing throughout the procedure under my direct supervision. CONTRAST:  None COMPLICATIONS: None immediate. PROCEDURE: RADIATION DOSE REDUCTION: This exam was performed according to the departmental dose-optimization program which includes automated exposure control, adjustment of the mA and/or kV according to patient size and/or use of iterative reconstruction technique. Informed written consent was obtained from the patient after a discussion of the risks, benefits and alternatives to treatment. The patient was placed supine on the CT gantry and a pre procedural CT was performed re-demonstrating the known abscess/fluid collection within the subcapsular aspect of the right lobe of the liver. The procedure was planned. A timeout was performed prior to the initiation of the procedure. The right upper quadrant was prepped and draped in the usual sterile fashion. The overlying soft tissues were anesthetized with 1% lidocaine with epinephrine. Appropriate trajectory was planned with the use of a 22 gauge spinal  needle. An 18 gauge trocar needle was advanced into the abscess/fluid collection and a short Amplatz super stiff wire was coiled within the collection. Appropriate positioning was confirmed with a limited CT scan. The tract was serially dilated allowing placement of a 10.2 [place] all-purpose drainage catheter. Appropriate positioning was confirmed with a limited postprocedural CT scan. Approximately 200 ml of viscous, dark brown fluid was aspirated. The tube was connected to a drainage bag and sutured in place. A dressing was placed. The patient tolerated the procedure well without immediate post procedural complication. IMPRESSION: Successful CT guided placement of a 10.2 [place] all purpose drain catheter into the right subcapsular fluid collection with aspiration of approximately 200 mL of viscous, dark brown fluid. Samples were sent to the laboratory as requested by the ordering clinical team. Ruthann Cancer, MD [facility] Electronically Signed   By: Ruthann Cancer M.D.   On: 08/12/2022 07:48   DG Chest 2 View  Result Date: 08/11/2022 CLINICAL DATA:  81 year old male with respiratory failure and hypoxia. EXAM: CHEST - 2 VIEW COMPARISON:  CT Abdomen and Pelvis 08/09/2022. FINDINGS: Portable AP semi upright view at 1011 hours. Layering pleural effusions seen 2 days ago by CT, more apparent on the right. Mildly lower lung volumes since  08/01/2022. Normal cardiac size and mediastinal contours. Visualized tracheal air column is within normal limits. No pneumothorax. Increased pulmonary vascularity diffusely. No consolidation. No acute osseous abnormality identified. IMPRESSION: Ongoing pleural effusions more apparent on the right. Increased pulmonary vascular congestion since 08/01/2022 suspicious for mild interstitial edema. Electronically Signed   By: Genevie Ann M.D.   On: 08/11/2022 10:36    Labs:  CBC: Recent Labs    08/12/22 0505 08/13/22 0431  08/14/22 0421 08/15/22 0431  WBC 9.6 9.6 9.0 9.1  HGB 10.3* 10.8* 11.0* 10.5*  HCT 31.2* 33.1* 33.2* 32.2*  PLT 270 300 333 352    COAGS: Recent Labs    04/26/22 0843 08/01/22 1832 08/02/22 0703 08/11/22 0941  INR 1.3*  --   --  1.1  APTT  --  121* 73*  --     BMP: Recent Labs    08/12/22 0505 08/13/22 0431 08/14/22 0421 08/15/22 0431  NA 136 137 136 135  K 3.8 3.9 3.7 3.8  CL 103 102 99 97*  CO2 '27 27 28 30  '$ GLUCOSE 119* 114* 204* 194*  BUN '8 11 15 15  '$ CALCIUM 8.7* 8.7* 8.7* 8.7*  CREATININE 0.86 0.87 0.79 0.72  GFRNONAA >60 >60 >60 >60    LIVER FUNCTION TESTS: Recent Labs    08/01/22 0717 08/02/22 0703 08/03/22 0819 08/10/22 0747  BILITOT 1.1 1.0 1.0 0.7  AST '22 19 18 17  '$ ALT '23 20 17 16  '$ ALKPHOS 53 72 64 73  PROT 5.2* 5.5* 5.0* 5.4*  ALBUMIN 2.4* 2.3* 2.0* 1.8*   Drain Location: RUQ Size: Fr size: 10 Fr Date of placement: 08/11/22  Currently to: Drain collection device: gravity 24 hour output:  Output by Drain (mL) 08/13/22 0701 - 08/13/22 1900 08/13/22 1901 - 08/14/22 0700 08/14/22 0701 - 08/14/22 1900 08/14/22 1901 - 08/15/22 0700 08/15/22 0701 - 08/15/22 0820  Closed System Drain 1 Right;Anterior;Lateral RUQ Other (Comment) 10 Fr.  60       Interval imaging/drain manipulation:  None  Current examination: Flushes/aspirates easily.  Insertion site unremarkable. Suture and stat lock in place. Dressed appropriately.   Plan: Continue TID flushes with 5 cc NS. Record output Q shift. Dressing changes QD or PRN if soiled.  Call IR APP or on call IR MD if difficulty flushing or sudden change in drain output.  Repeat imaging/possible drain injection once output < 10 mL/QD (excluding flush material). Consideration for drain removal if output is < 10 mL/QD (excluding flush material), pending discussion with the providing surgical service.  Discharge planning: Please contact IR APP or on call IR MD prior to patient d/c to ensure appropriate follow  up plans are in place. Typically patient will follow up with IR clinic 10-14 days post d/c for repeat imaging/possible drain injection. IR scheduler will contact patient with date/time of appointment. Patient will need to flush drain QD with 5 cc NS, record output QD, dressing changes every 2-3 days or earlier if soiled.   IR will continue to follow - please call with questions or concerns.  Assessment and Plan:  Perihepatic collection drain intact Flushes easily No growth Will follow --- will need CT when OP is less than 10 cc/24 hr If home with drain-- IR scheduler will call pt with time and date of follow up  Electronically Signed: Lavonia Drafts, PA-C 08/15/2022, 8:16 AM   I spent a total of 15 Minutes at the the patient's bedside AND on the patient's hospital floor or unit,  greater than 50% of which was counseling/coordinating care for perihepatic collection drain

## 2022-08-16 DIAGNOSIS — K858 Other acute pancreatitis without necrosis or infection: Secondary | ICD-10-CM | POA: Diagnosis not present

## 2022-08-16 DIAGNOSIS — E43 Unspecified severe protein-calorie malnutrition: Secondary | ICD-10-CM | POA: Diagnosis not present

## 2022-08-16 LAB — BASIC METABOLIC PANEL
Anion gap: 9 (ref 5–15)
BUN: 20 mg/dL (ref 8–23)
CO2: 27 mmol/L (ref 22–32)
Calcium: 8.5 mg/dL — ABNORMAL LOW (ref 8.9–10.3)
Chloride: 93 mmol/L — ABNORMAL LOW (ref 98–111)
Creatinine, Ser: 0.8 mg/dL (ref 0.61–1.24)
GFR, Estimated: >60 mL/min (ref >60)
Glucose, Bld: 265 mg/dL — ABNORMAL HIGH (ref 70–99)
Potassium: 4 mmol/L (ref 3.5–5.1)
Sodium: 129 mmol/L — ABNORMAL LOW (ref 135–145)

## 2022-08-16 LAB — AEROBIC/ANAEROBIC CULTURE W GRAM STAIN (SURGICAL/DEEP WOUND)
Culture: NO GROWTH
Gram Stain: NONE SEEN

## 2022-08-16 LAB — CBC
HCT: 30.6 % — ABNORMAL LOW (ref 39.0–52.0)
Hemoglobin: 10 g/dL — ABNORMAL LOW (ref 13.0–17.0)
MCH: 29.5 pg (ref 26.0–34.0)
MCHC: 32.7 g/dL (ref 30.0–36.0)
MCV: 90.3 fL (ref 80.0–100.0)
Platelets: 360 10*3/uL (ref 150–400)
RBC: 3.39 MIL/uL — ABNORMAL LOW (ref 4.22–5.81)
RDW: 15.6 % — ABNORMAL HIGH (ref 11.5–15.5)
WBC: 10.4 10*3/uL (ref 4.0–10.5)
nRBC: 0 % (ref 0.0–0.2)

## 2022-08-16 LAB — GLUCOSE, CAPILLARY
Glucose-Capillary: 211 mg/dL — ABNORMAL HIGH (ref 70–99)
Glucose-Capillary: 235 mg/dL — ABNORMAL HIGH (ref 70–99)
Glucose-Capillary: 240 mg/dL — ABNORMAL HIGH (ref 70–99)
Glucose-Capillary: 250 mg/dL — ABNORMAL HIGH (ref 70–99)
Glucose-Capillary: 253 mg/dL — ABNORMAL HIGH (ref 70–99)

## 2022-08-16 LAB — HEPARIN LEVEL (UNFRACTIONATED): Heparin Unfractionated: 0.39 IU/mL (ref 0.30–0.70)

## 2022-08-16 MED ORDER — INSULIN ASPART 100 UNIT/ML IJ SOLN
0.0000 [IU] | INTRAMUSCULAR | Status: DC
  Administered 2022-08-16: 3 [IU] via SUBCUTANEOUS
  Administered 2022-08-16: 5 [IU] via SUBCUTANEOUS
  Administered 2022-08-17 (×2): 3 [IU] via SUBCUTANEOUS

## 2022-08-16 NOTE — Progress Notes (Addendum)
Progress Note  Patient: Douglas Hurst. ZOX:[id number] DOB: 1941/08/01  DOA: 07/30/2022  DOS: 08/16/2022    Brief hospital course: Douglas Hurst. is a 81 y.o. male with a history of HFw/recoveredEF, PAF, NIDT2DM, HTN, HLD, GERD, COPD, and acute calculous cholecystitis and choledocholithiasis admitted here 4/29, discharged with cholecystostomy tube and discharged. He had AFib w/RVR and CHF during that admit, but with rate and rhythm control, EF normalized. He had unsuccessful ERCP and subsequent laparoscopic cholecystectomy 7/23 with inability to clear the bile duct. Referred for ERCP at [facility]. [facility] ERCP was performed 8/3 and he was discharged home only to return to [facility] with abdominal pain and post-ERCP pancreatitis with imaging concerning for necrosis. Also had difficulty urinating. IVF, IV antibiotics were started, and GI consulted on admission. Hospitalization complicated by AFib w/RVR which has since converted to NSR. Urinary retention improved on higher dose tamsulosin. He failed advancing diet on 8/10, repeat CT showed enlarging perihepatic fluid collections. IR placed drain 8/15.  Assessment and Plan: Acute pancreatitis-duodenitis with contained duodenal perforation (s/p rendezvous ERCP; transduodenal approach to puncture extrahepatic bile duct to place guidewire into duodenum-subsequently ERCP scope inserted and stone removed):  - CT repeated 8/13 showing fluid collections, enlarging. Favor reactive inflammation and not necessarily pancreatic pseudocyst.  - GI considering repeat CT with oral contrast early this coming week, also considering upper endoscopy.  No contrast into fluid collection on CT's. Note lipase was normal on admission. No pancreatic duct involvement in preceding procedure. ?if symptoms primarily related to duodenal inflammation/puncture. Initial concern for pancreatic head necrosis not borne out with serial exams, currently appears normal. - s/p aspiration and drain  placement for perihepatic fluid collection 8/15, output declining 200cc > 375cc > 75cc > 60cc > 90cc. Per IR, repeat CT once output is 10cc/24hrs. Culture negative. - Leukocytosis resolved, afebrile, culture remains negative, received 14 full days of IV antibiotics.   - Pain control as ordered, continue fentanyl low dose patch, emphasize minimizing IV narcotics.  - s/p cortrak placed 8/16, continue TFs, may also have unrestricted diet per GI.    Severe protein calorie malnutrition: As part of a general failure to thrive. - TF's and can push oral intake as well.  PAF with RVR: RVR precipitated by acute pancreatitis.  - Remains in NSR on amiodarone, metoprolol - Continue heparin gtt, plan to transition to DOAC once durably tolerating PO and no procedure planned.   Acute hypoxic respiratory failure: Due to pleural effusions, atelectasis, improved. - Wean O2 as tolerated. Augment diuresis as below. If unable to wean when felt to be euvolemic, would repeat CXR, consider thoracentesis. ?if related to CHF and/or abdominal process - Incentive spirometry encouraged  Ambulatory dysfunction: Pt very weakened/deconditioned. - Ordered OOB qShift if possible, continue PT/OT. Current recommendation has been for CIR.    AKI: Prerenal and suspected postrenal. Resolved.  - SCr is stable with IV diuresis. Monitor volume status  Acute urinary retention, BPH: Resolved retention, having UOP but less than would be expected based on weight change and aggressive diuresis.    - Continue tamsulosin 0.'8mg'$  daily  Constipation: Resolved.  - Deescalate bowel regimen to miralax and senna once daily.   Hypophosphatemia: Supplement.  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  - Weight coming back down with initiation of lasix. Admit weight ~80kg, up to 88.6kg 8/10, since declining, said to be 75.5kg today which is out of proportion to documented UOP.  -  Repeat  lasix BID today and monitor   NIDT2DM: Well controlled chronically with HbA1c 5.6% July 2023.  - Continue SSI, change to sensitive scale and q4h with continuous tube feeds and rising hyperglycemia.    HTN:  - Continue meds   COPD: No wheezing.  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 Will monitor, With recent AFib, goal would be 4.0    Subjective: Not eating much but with max encouragement is taking some bites every meal. No vomiting, no nausea, no worsening of pain with po intake. He just has no appetite and finds it difficult to force himself to eat more.   Objective: Vitals:   08/16/22 0500 08/16/22 0750 08/16/22 0800 08/16/22 1223  BP:  (!) 131/56 (!) 131/57 (!) 127/59  Pulse:  80 81 74  Resp:  '17 15 17  '$ Temp:  98 F (36.7 C)  98.4 F (36.9 C)  TempSrc:  Oral  Oral  SpO2:  90% 92%   Weight: 75.5 kg     Height:      Gen: Frail elderly male in no distress Pulm: Nonlabored breathing supplemental oxygen, Diminished at R > L base.  CV: Regular, without murmur, rub, or gallop. No JVD, trace dependent edema. GI: Abdomen soft, tender in diffuse R/upper abdomen without fluid wave, distention, rebound or guarding. +bowel sounds.  Ext: Warm, no deformities Skin: No new rashes, lesions or ulcers on visualized skin. RUQ drain site c/d/i Neuro: Alert and oriented. No focal neurological deficits. Psych: Judgement and insight appear intact. Mood euthymic & affect congruent. Behavior is appropriate.    Data Personally reviewed: CBC: Recent Labs  Lab 08/12/22 0505 08/13/22 0431 08/14/22 0421 08/15/22 0431 08/16/22 0739  WBC 9.6 9.6 9.0 9.1 10.4  HGB 10.3* 10.8* 11.0* 10.5* 10.0*  HCT 31.2* 33.1* 33.2* 32.2* 30.6*  MCV 91.2 89.9 90.2 90.7 90.3  PLT 270 300 333 352 254   Basic Metabolic Panel: Recent Labs  Lab 08/12/22 0505 08/13/22 0431  08/13/22 0431 08/13/22 1609 08/14/22 0421 08/14/22 1753 08/15/22 0431 08/15/22 1629 08/16/22 0739  NA 136 137  --   --  136  --  135  --  129*  K 3.8 3.9  --   --  3.7  --  3.8  --  4.0  CL 103 102  --   --  99  --  97*  --  93*  CO2 27 27  --   --  28  --  30  --  27  GLUCOSE 119* 114*  --   --  204*  --  194*  --  265*  BUN 8 11  --   --  15  --  15  --  20  CREATININE 0.86 0.87  --   --  0.79  --  0.72  --  0.80  CALCIUM 8.7* 8.7*  --   --  8.7*  --  8.7*  --  8.5*  MG  --  1.9   < > 2.0 1.9 2.0 1.8 1.8  --   PHOS  --  3.0   < > 2.6 2.2* 2.9 2.6 2.5  --    < > = values in this interval not displayed.   GFR: Estimated Creatinine Clearance: 80 mL/min (by C-G formula based on SCr of 0.8 mg/dL). Liver Function Tests: Recent Labs  Lab 08/10/22 0747  AST 17  ALT 16  ALKPHOS 73  BILITOT 0.7  PROT 5.4*  ALBUMIN 1.8*   Coagulation Profile: Recent Labs  Lab 08/11/22 0941  INR 1.1   CBG: Recent Labs  Lab 08/15/22 1228 08/15/22 1643 08/15/22 2222 08/16/22 0752 08/16/22 1220  GLUCAP 199* 213* 253* 250* 240*   Urine analysis:    Component Value Date/Time   COLORURINE YELLOW 04/25/2022 1232   APPEARANCEUR CLEAR 04/25/2022 1232   LABSPEC <1.005 (L) 04/25/2022 1232   PHURINE 6.0 04/25/2022 1232   GLUCOSEU 100 (A) 04/25/2022 1232   HGBUR TRACE (A) 04/25/2022 1232   BILIRUBINUR NEGATIVE 04/25/2022 1232   KETONESUR NEGATIVE 04/25/2022 1232   PROTEINUR NEGATIVE 04/25/2022 1232   UROBILINOGEN 4.0 (H) 10/02/2015 2135   NITRITE POSITIVE (A) 04/25/2022 1232   LEUKOCYTESUR SMALL (A) 04/25/2022 1232   Recent Results (from the past 240 hour(s))  Aerobic/Anaerobic Culture w Gram Stain (surgical/deep wound)     Status: None   Collection Time: 08/11/22  4:42 PM   Specimen: Abscess  Result Value Ref Range Status   Specimen Description ABSCESS  Final   Special Requests LIVER  Final   Gram Stain NO ORGANISMS SEEN NO WBC SEEN   Final   Culture   Final    No growth  aerobically or anaerobically. Performed at [facility], [place] [address] [place], [place] [address]    Report Status 08/16/2022 FINAL  Final     No results found.  Family Communication: Spouse at bedside  Disposition: Status is: Inpatient Remains inpatient appropriate because: Persistent abdominal pain, inadequate oral intake Planned Discharge Destination: CIR  Patrecia Pour, MD 08/16/2022 2:12 PM Page by [email or web address]

## 2022-08-16 NOTE — Progress Notes (Signed)
ANTICOAGULATION CONSULT NOTE - Follow Up Consult  Pharmacy Consult for Heparin Indication: atrial fibrillation  No Known Allergies  Patient Measurements: Height: '6\' 1"'$  (185.4 cm) Weight: 75.5 kg (166 lb 7.2 oz) IBW/kg (Calculated) : 79.9 Heparin Dosing Weight: 80 kg  Vital Signs: Temp: 98 F (36.7 C) (08/20 0750) Temp Source: Oral (08/20 0750) BP: 131/57 (08/20 0800) Pulse Rate: 81 (08/20 0800)  Labs: Recent Labs    08/14/22 0421 08/15/22 0431 08/16/22 0739  HGB 11.0* 10.5* 10.0*  HCT 33.2* 32.2* 30.6*  PLT 333 352 360  HEPARINUNFRC 0.30 0.38 0.39  CREATININE 0.79 0.72 0.80     Estimated Creatinine Clearance: 80 mL/min (by C-G formula based on SCr of 0.8 mg/dL).  Assessment: Douglas Hurst is a 81 y/o male whose pertinent medication history includes Afib (on Eliquis prior to  admission) and CHF. Pharmacy has been consulted for IV heparin dosing, no bolus per MD. PTA Eliquis is on hold. His last dose of Eliquis was on 07/26/22 PTA.  Heparin level remains therapeutic at 0.39 IU/mL.  Hgb stable. Platelet count within normal /stable.  RN notes no bleeding concerns or issues with the line.    Goal of Therapy:  Heparin level 0.3-0.7 units/ml Monitor platelets by anticoagulation protocol: Yes   Plan:  Continue IV heparin drip at 1500 units/hr Monitor with daily HL and CBC Follow up for plans to transition back to Eliquis once tolerating PO and no procedures planned.  Vicenta Dunning, PharmD  PGY1 Pharmacy Resident

## 2022-08-16 NOTE — Progress Notes (Signed)
[name] 10:16 AM  Subjective: Patient seen and examined and discussed with his wife and he is eating a little and yesterday did physical therapy and hurts around his drain has no new complaints  Objective: Vital signs stable afebrile no acute distress abdomen is soft nontender minimal bloody fluid in the drain decreased quantity on I's and O's labs stable  Assessment: Failure to thrive  Plan: Probable repeat CT next week as needed or if it continues to improve may be Wednesday or Thursday and as soon as possible get drain out so you will be able to I believe weaning his pain medicine further particularly if CT improved and the wife is happy with the current feeding situation and I encouraged him to continue to work with physical therapy and eat as much as he can and my partner Dr. Michail Sermon will check on tomorrow  [facility]  office [phone] After 5PM or if no answer call [phone]

## 2022-08-17 DIAGNOSIS — E43 Unspecified severe protein-calorie malnutrition: Secondary | ICD-10-CM | POA: Diagnosis not present

## 2022-08-17 DIAGNOSIS — J9601 Acute respiratory failure with hypoxia: Secondary | ICD-10-CM | POA: Diagnosis not present

## 2022-08-17 DIAGNOSIS — K858 Other acute pancreatitis without necrosis or infection: Secondary | ICD-10-CM | POA: Diagnosis not present

## 2022-08-17 DIAGNOSIS — I5033 Acute on chronic diastolic (congestive) heart failure: Secondary | ICD-10-CM | POA: Diagnosis not present

## 2022-08-17 LAB — BASIC METABOLIC PANEL
Anion gap: 5 (ref 5–15)
BUN: 20 mg/dL (ref 8–23)
CO2: 34 mmol/L — ABNORMAL HIGH (ref 22–32)
Calcium: 9 mg/dL (ref 8.9–10.3)
Chloride: 94 mmol/L — ABNORMAL LOW (ref 98–111)
Creatinine, Ser: 0.84 mg/dL (ref 0.61–1.24)
GFR, Estimated: >60 mL/min (ref >60)
Glucose, Bld: 205 mg/dL — ABNORMAL HIGH (ref 70–99)
Potassium: 4.3 mmol/L (ref 3.5–5.1)
Sodium: 133 mmol/L — ABNORMAL LOW (ref 135–145)

## 2022-08-17 LAB — HEPARIN LEVEL (UNFRACTIONATED): Heparin Unfractionated: 0.32 IU/mL (ref 0.30–0.70)

## 2022-08-17 LAB — CBC
HCT: 30.4 % — ABNORMAL LOW (ref 39.0–52.0)
Hemoglobin: 10.2 g/dL — ABNORMAL LOW (ref 13.0–17.0)
MCH: 29.8 pg (ref 26.0–34.0)
MCHC: 33.6 g/dL (ref 30.0–36.0)
MCV: 88.9 fL (ref 80.0–100.0)
Platelets: 411 10*3/uL — ABNORMAL HIGH (ref 150–400)
RBC: 3.42 MIL/uL — ABNORMAL LOW (ref 4.22–5.81)
RDW: 15.7 % — ABNORMAL HIGH (ref 11.5–15.5)
WBC: 11.3 10*3/uL — ABNORMAL HIGH (ref 4.0–10.5)
nRBC: 0 % (ref 0.0–0.2)

## 2022-08-17 LAB — GLUCOSE, CAPILLARY
Glucose-Capillary: 213 mg/dL — ABNORMAL HIGH (ref 70–99)
Glucose-Capillary: 220 mg/dL — ABNORMAL HIGH (ref 70–99)
Glucose-Capillary: 242 mg/dL — ABNORMAL HIGH (ref 70–99)
Glucose-Capillary: 297 mg/dL — ABNORMAL HIGH (ref 70–99)
Glucose-Capillary: 223 mg/dL — ABNORMAL HIGH (ref 70–99)
Glucose-Capillary: 181 mg/dL — ABNORMAL HIGH (ref 70–99)
Glucose-Capillary: 173 mg/dL — ABNORMAL HIGH (ref 70–99)

## 2022-08-17 MED ORDER — INSULIN ASPART 100 UNIT/ML IJ SOLN
0.0000 [IU] | INTRAMUSCULAR | Status: DC
  Administered 2022-08-17: 2 [IU] via SUBCUTANEOUS
  Administered 2022-08-17: 5 [IU] via SUBCUTANEOUS
  Administered 2022-08-17: 3 [IU] via SUBCUTANEOUS
  Administered 2022-08-17: 2 [IU] via SUBCUTANEOUS
  Administered 2022-08-18: 3 [IU] via SUBCUTANEOUS
  Administered 2022-08-18: 2 [IU] via SUBCUTANEOUS
  Administered 2022-08-18 (×2): 3 [IU] via SUBCUTANEOUS
  Administered 2022-08-18: 5 [IU] via SUBCUTANEOUS
  Administered 2022-08-18 – 2022-08-19 (×2): 2 [IU] via SUBCUTANEOUS
  Administered 2022-08-19: 1 [IU] via SUBCUTANEOUS
  Administered 2022-08-19: 2 [IU] via SUBCUTANEOUS
  Administered 2022-08-19: 5 [IU] via SUBCUTANEOUS
  Administered 2022-08-19 – 2022-08-20 (×4): 3 [IU] via SUBCUTANEOUS
  Administered 2022-08-20 – 2022-08-21 (×2): 5 [IU] via SUBCUTANEOUS
  Administered 2022-08-21: 2 [IU] via SUBCUTANEOUS
  Administered 2022-08-21: 1 [IU] via SUBCUTANEOUS
  Administered 2022-08-21: 5 [IU] via SUBCUTANEOUS
  Administered 2022-08-21 (×2): 1 [IU] via SUBCUTANEOUS
  Administered 2022-08-22 (×2): 3 [IU] via SUBCUTANEOUS
  Administered 2022-08-22 (×3): 2 [IU] via SUBCUTANEOUS
  Administered 2022-08-22: 3 [IU] via SUBCUTANEOUS
  Administered 2022-08-23: 1 [IU] via SUBCUTANEOUS
  Administered 2022-08-23: 2 [IU] via SUBCUTANEOUS
  Administered 2022-08-23 (×2): 3 [IU] via SUBCUTANEOUS
  Administered 2022-08-23 – 2022-08-24 (×2): 1 [IU] via SUBCUTANEOUS
  Administered 2022-08-25: 2 [IU] via SUBCUTANEOUS
  Administered 2022-08-25: 3 [IU] via SUBCUTANEOUS
  Administered 2022-08-26 – 2022-08-27 (×2): 2 [IU] via SUBCUTANEOUS
  Administered 2022-08-27 – 2022-08-28 (×2): 1 [IU] via SUBCUTANEOUS
  Administered 2022-08-30: 2 [IU] via SUBCUTANEOUS
  Administered 2022-08-30 – 2022-08-31 (×3): 1 [IU] via SUBCUTANEOUS
  Administered 2022-09-01 (×2): 2 [IU] via SUBCUTANEOUS
  Administered 2022-09-01 – 2022-09-03 (×5): 1 [IU] via SUBCUTANEOUS
  Administered 2022-09-03: 2 [IU] via SUBCUTANEOUS
  Administered 2022-09-03 – 2022-09-04 (×3): 1 [IU] via SUBCUTANEOUS
  Administered 2022-09-04 (×2): 2 [IU] via SUBCUTANEOUS
  Administered 2022-09-05 (×4): 1 [IU] via SUBCUTANEOUS
  Administered 2022-09-05 – 2022-09-06 (×2): 2 [IU] via SUBCUTANEOUS
  Administered 2022-09-07 (×2): 1 [IU] via SUBCUTANEOUS
  Administered 2022-09-07: 2 [IU] via SUBCUTANEOUS

## 2022-08-17 MED ORDER — FUROSEMIDE 10 MG/ML IJ SOLN
80.0000 mg | Freq: Two times a day (BID) | INTRAMUSCULAR | Status: DC
Start: 2022-08-17 — End: 2022-08-19
  Administered 2022-08-17 – 2022-08-18 (×4): 80 mg via INTRAVENOUS
  Filled 2022-08-17 (×4): qty 8

## 2022-08-17 MED ORDER — INSULIN ASPART 100 UNIT/ML IJ SOLN
3.0000 [IU] | INTRAMUSCULAR | Status: DC
  Administered 2022-08-17 – 2022-08-23 (×31): 3 [IU] via SUBCUTANEOUS

## 2022-08-17 MED ORDER — INSULIN ASPART 100 UNIT/ML IJ SOLN
0.0000 [IU] | INTRAMUSCULAR | Status: DC
  Administered 2022-08-17: 5 [IU] via SUBCUTANEOUS

## 2022-08-17 NOTE — Progress Notes (Signed)
ANTICOAGULATION CONSULT NOTE - Follow Up Consult  Pharmacy Consult for Heparin Indication: atrial fibrillation  No Known Allergies  Patient Measurements: Height: '6\' 1"'$  (185.4 cm) Weight: 79.6 kg (175 lb 7.8 oz) IBW/kg (Calculated) : 79.9 Heparin Dosing Weight: 80 kg  Vital Signs: Temp: 97.6 F (36.4 C) (08/21 0802) Temp Source: Oral (08/21 0802) BP: 138/65 (08/21 0802) Pulse Rate: 76 (08/21 0802)  Labs: Recent Labs    08/15/22 0431 08/16/22 0739 08/17/22 0308  HGB 10.5* 10.0* 10.2*  HCT 32.2* 30.6* 30.4*  PLT 352 360 411*  HEPARINUNFRC 0.38 0.39 0.32  CREATININE 0.72 0.80 0.84     Estimated Creatinine Clearance: 80.3 mL/min (by C-G formula based on SCr of 0.84 mg/dL).  Assessment: Douglas Hurst is a 81 y/o male whose pertinent medication history includes Afib (on Eliquis prior to  admission) and CHF. Pharmacy has been consulted for IV heparin dosing, no bolus per MD. PTA Eliquis is on hold. His last dose of Eliquis was on 07/26/22 PTA.  Heparin level remains therapeutic at 0.32 IU/mL.  Hgb stable. Platelet count stable.  No bleeding concerns or issues with the line noted.   Goal of Therapy:  Heparin level 0.3-0.7 units/ml Monitor platelets by anticoagulation protocol: Yes   Plan:  Continue IV heparin drip at 1500 units/hr Monitor with daily HL and CBC Follow up for plans to transition back to Eliquis once tolerating PO and no procedures planned.   Thank you for allowing [place] to participate in this patients care. Jens Som, PharmD 08/17/2022 9:37 AM  **Pharmacist phone directory can be found on [email or web address] listed under [place]**

## 2022-08-17 NOTE — Inpatient Diabetes Management (Signed)
Inpatient Diabetes Program Recommendations  AACE/ADA: New Consensus Statement on Inpatient Glycemic Control (2015)  Target Ranges:  Prepandial:   less than 140 mg/dL      Peak postprandial:   less than 180 mg/dL (1-2 hours)      Critically ill patients:  140 - 180 mg/dL   Lab Results  Component Value Date   GLUCAP 242 (H) 08/17/2022   HGBA1C 5.6 07/03/2022    Review of Glycemic Control  Latest Reference Range & Units 08/16/22 07:52 08/16/22 12:20 08/16/22 16:10 08/16/22 19:45 08/16/22 23:36 08/17/22 03:23 08/17/22 04:09 08/17/22 09:21  Glucose-Capillary 70 - 99 mg/dL 250 (H) 240 (H) 253 (H) 211 (H) 235 (H) 213 (H) 220 (H) 242 (H)   Diabetes history: DM 2 Outpatient Diabetes medications: Metformin 1000 mg Daily, 500 mg qpm Current orders for Inpatient glycemic control:  Novolog 0-15 units Q4 hours  Vital 60 ml/hr A1c 5.6% on 7/7  Inpatient Diabetes Program Recommendations:    -  Start Novolog 3 units Q4 hours Tube Feed Coverage.  Thanks, Tama Headings RN, MSN, BC-ADM Inpatient Diabetes Coordinator Team Pager [phone] (8a-5p)

## 2022-08-17 NOTE — Progress Notes (Signed)
[facility] Gastroenterology Progress Note  Kristjan Derner. 81 y.o. 1941/06/22  CC:  Abdominal pain, pancreatitis/duodenitis   Subjective: Patient seen and examined at bedside.  Continues to have right-sided abdominal pain but denies nausea/vomiting.  Tolerated oatmeal this morning.  Has been having bowel movements.  ROS : Review of Systems  Constitutional:  Negative for chills and fever.  Gastrointestinal:  Positive for abdominal pain. Negative for blood in stool, constipation, diarrhea, melena, nausea and vomiting.      Objective: Vital signs in last 24 hours: Vitals:   08/17/22 0411 08/17/22 0802  BP: 126/66 138/65  Pulse: 81 76  Resp: 20 (!) 22  Temp: 97.6 F (36.4 C) 97.6 F (36.4 C)  SpO2: 92%     Physical Exam:  General:  Alert, cooperative, no distress, mildly agitated  Head:  Normocephalic, without obvious abnormality, atraumatic.   Eyes:  Anicteric sclera, EOM's intact  Lungs:   Clear to auscultation bilaterally, respirations unlabored, supplemental oxygen  Heart:  Regular rate and rhythm, S1, S2 normal  Abdomen:   Soft, tender to palpation right side of abdomen, particularly around site of drain placement, no rebound, bowel sounds active  Extremities: Extremities normal, atraumatic, no  edema  Pulses: 2+ and symmetric    Lab Results: Recent Labs    08/15/22 0431 08/15/22 1629 08/16/22 0739 08/17/22 0308  NA 135  --  129* 133*  K 3.8  --  4.0 4.3  CL 97*  --  93* 94*  CO2 30  --  27 34*  GLUCOSE 194*  --  265* 205*  BUN 15  --  20 20  CREATININE 0.72  --  0.80 0.84  CALCIUM 8.7*  --  8.5* 9.0  MG 1.8 1.8  --   --   PHOS 2.6 2.5  --   --    No results for input(s): "AST", "ALT", "ALKPHOS", "BILITOT", "PROT", "ALBUMIN" in the last 72 hours. Recent Labs    08/16/22 0739 08/17/22 0308  WBC 10.4 11.3*  HGB 10.0* 10.2*  HCT 30.6* 30.4*  MCV 90.3 88.9  PLT 360 411*   No results for input(s): "LABPROT", "INR" in the last 72  hours.    Assessment Abdominal pain, pancreatitis/duodenitis -S/p ERCP at [facility] 08/05/3809 complicated by post-ERCP pancreatitis -He underwent EUS, rendezvous ERCP and sphincterotomy with 2 stones removed.  Regular ERCP was unsuccessful.  Dr. Okey Dupre did a transduodenal approach to puncture the extrahepatic bile duct to pass a guidewire into the duodenum.  Subsequently ERCP scope was inserted and cannulation was performed over the guidewire.  Largest one was around 12 mm in size.  2 stones were removed from CBD.  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  Tiny extraluminal air bubbles again noted adjacent to the pancreatic head and duodenum.  Increased moderate bilateral pleural effusions and bilateral lower lobe atelectasis.  -S/p IR aspiration and drain placement on 08/11/2022 for perihepatic fluid collection -Output of 250 mL at 0300 this morning -Received 14 full days of IV antibiotics, culture remains negative, patient is afebrile and had improvement in leukocytosis though WBC is elevated to 11.3 this morning  Plan: Patient continues to have right-sided abdominal pain around site of drain.  Drain placed 08/11/2022 with output of 250 mL overnight.  Leukocytosis had resolved following 14 days of IV antibiotics, however WBC is elevated this morning to 11.3.  He is afebrile.  Will discuss timing of repeat CT  scan with Dr. Michail Sermon who will see patient later today.  Appears plan per IR is to wait until drain output is 10 cc/24 hours. Continue supportive care and pain management. Diet as tolerated per primary team. [facility] will follow.  Angelique Holm PA-C 08/17/2022, 10:18 AM  Contact #  [phone]

## 2022-08-17 NOTE — Progress Notes (Signed)
Progress Note  Patient: Douglas Hurst. TIR:[id number] DOB: 21-Aug-1941  DOA: 07/30/2022  DOS: 08/17/2022    Brief hospital course: [name] is a 81 y.o. male with a history of HFw/recoveredEF, PAF, NIDT2DM, HTN, HLD, GERD, COPD, and acute calculous cholecystitis and choledocholithiasis admitted here 4/29, discharged with cholecystostomy tube and discharged. He had AFib w/RVR and CHF during that admit, but with rate and rhythm control, EF normalized. He had unsuccessful ERCP and subsequent laparoscopic cholecystectomy 7/23 with inability to clear the bile duct. Referred for ERCP at [facility]. Rendezvous ERCP was performed 8/3 and he was discharged home only to return to [facility] with abdominal pain and post-ERCP pancreatitis with imaging concerning for necrosis. Also had difficulty urinating. IVF, IV antibiotics were started, and GI consulted on admission. Hospitalization complicated by AFib w/RVR which has since converted to NSR. Urinary retention improved on higher dose tamsulosin. He failed advancing diet on 8/10, repeat CT showed enlarging perihepatic fluid collections. IR placed drain 8/15.  Assessment and Plan: Acute pancreatitis-duodenitis with contained duodenal perforation (s/p rendezvous ERCP; transduodenal approach to puncture extrahepatic bile duct to place guidewire into duodenum-subsequently ERCP scope inserted and stone removed):  - CT repeated 8/13 showing fluid collections, enlarging. Favor reactive inflammation and not necessarily pancreatic pseudocyst.  - GI considering repeat CT with oral contrast early this coming week, also considering upper endoscopy.  No contrast into fluid collection on CT's. Note lipase was normal on admission. No pancreatic duct involvement in preceding procedure. ?if symptoms primarily related to duodenal inflammation/puncture. Initial concern for pancreatic head necrosis not borne out with serial exams, currently appears normal. - s/p aspiration and drain  placement for perihepatic fluid collection 8/15, output declining declining initially but still significant. Per IR, repeat CT once output is 10cc/24hrs. Culture negative. - Leukocytosis recurrent, checking UA as below. Anticipate repeat CT and/or endoscopic evaluation this week, defer to GI for timing. Will monitor leukocytosis and abd exam closely. - Pain control as ordered, continue fentanyl low dose patch, emphasize minimizing IV narcotics.  - s/p cortrak placed 8/16, continue TFs, may also have unrestricted diet per GI.    Severe protein calorie malnutrition: As part of a general failure to thrive. - TF's and can push oral intake as well.  PAF with RVR: RVR precipitated by acute pancreatitis.  - Remains in NSR on amiodarone, metoprolol - Continue heparin gtt, plan to transition to DOAC once durably tolerating PO and no procedure planned.   Acute hypoxic respiratory failure: Due to pleural effusions, atelectasis, improved. - Attempt wean to room air today. Augmented diuresis as below. If unable to wean when felt to be euvolemic, would repeat CXR, consider thoracentesis. ?if related to CHF and/or abdominal process - Incentive spirometry encouraged  Ambulatory dysfunction: Pt very weakened/deconditioned. - Ordered OOB qShift if possible, continue PT/OT. Current recommendation has been for CIR.    AKI: Prerenal and suspected postrenal. Resolved.  - SCr is stable with IV diuresis. Monitor volume status  Acute urinary retention, BPH: Resolved retention, having UOP but less than would be expected based on weight change and aggressive diuresis.    - Continue tamsulosin 0.'8mg'$  daily  Constipation: Resolved.  - Continue miralax and senna once daily.   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  - Weight down overall, UOP fair, negative bladder scan for retention. Will augment lasix to '80mg'$  IV  BID as UOP is not  what it has been with diuresis in the past, metabolic profile is very stable. - Admit weight ~80kg, up to 88.6kg 8/10, since declining   NIDT2DM: Well controlled chronically with HbA1c 5.6% July 2023.  - Continue SSI, changed to moderate SSI, though agree with diabetes coordinator that adding 3u q4h to sensitive scale is better plan. Orders changed. Hyperglycemia suggests oral intake is improving.   HTN:  - Continue meds   COPD: No wheezing.  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 Will monitor, With recent AFib, goal would be 4.0  Suprapubic tenderness:  - Check UA. Also has leukocytosis which has been attributed to inflammatory abdominal process.    Subjective: Ate most of the oatmeal this morning, got cleaned up, feeling overall about the same. Does not complain of dyspnea. No fever. No NV or constipation.  Objective: Vitals:   08/17/22 0347 08/17/22 0411 08/17/22 0439 08/17/22 0802  BP: 132/63 126/66  138/65  Pulse: 81 81  76  Resp: 20 20  (!) 22  Temp: (!) 97.3 F (36.3 C) 97.6 F (36.4 C)  97.6 F (36.4 C)  TempSrc: Oral Oral  Oral  SpO2: 92% 92%    Weight:   79.6 kg   Height:      Gen: Frail elderly thin male in no distress Pulm: Nonlabored breathing supplemental oxygen. Clear upper, diminished lower anterolateral exam. CV: RRR without murmur, rub, or gallop. No JVD, improved dependent edema. GI: Abdomen soft, stable tenderness without rebound or guarding in R/upper abdomen, new suprapubic tenderness, +BS.   Ext: Warm, no deformities Skin: R abd drain site c/d/I without rashes, lesions or ulcers on visualized skin. Neuro: Alert and oriented. No focal neurological deficits. Psych: Judgement and insight appear fair. Mood euthymic & affect congruent. Behavior is appropriate.    Data Personally reviewed: CBC: Recent Labs  Lab  08/13/22 0431 08/14/22 0421 08/15/22 0431 08/16/22 0739 08/17/22 0308  WBC 9.6 9.0 9.1 10.4 11.3*  HGB 10.8* 11.0* 10.5* 10.0* 10.2*  HCT 33.1* 33.2* 32.2* 30.6* 30.4*  MCV 89.9 90.2 90.7 90.3 88.9  PLT 300 333 352 360 563*   Basic Metabolic Panel: Recent Labs  Lab 08/13/22 0431 08/13/22 1609 08/14/22 0421 08/14/22 1753 08/15/22 0431 08/15/22 1629 08/16/22 0739 08/17/22 0308  NA 137  --  136  --  135  --  129* 133*  K 3.9  --  3.7  --  3.8  --  4.0 4.3  CL 102  --  99  --  97*  --  93* 94*  CO2 27  --  28  --  30  --  27 34*  GLUCOSE 114*  --  204*  --  194*  --  265* 205*  BUN 11  --  15  --  15  --  20 20  CREATININE 0.87  --  0.79  --  0.72  --  0.80 0.84  CALCIUM 8.7*  --  8.7*  --  8.7*  --  8.5* 9.0  MG 1.9 2.0 1.9 2.0 1.8 1.8  --   --   PHOS 3.0 2.6 2.2* 2.9 2.6 2.5  --   --    GFR: Estimated Creatinine Clearance: 80.3 mL/min (by C-G formula based on SCr of 0.84 mg/dL). Liver Function Tests: No results for input(s): "AST", "ALT", "ALKPHOS", "BILITOT", "PROT", "ALBUMIN" in the last 168 hours.  Coagulation Profile: Recent Labs  Lab 08/11/22 0941  INR 1.1   CBG: Recent Labs  Lab 08/16/22 1945 08/16/22 2336 08/17/22 0323 08/17/22 0409 08/17/22 0921  GLUCAP 211* 235* 213* 220* 242*   Urine analysis:    Component Value Date/Time   COLORURINE YELLOW 04/25/2022 1232   APPEARANCEUR CLEAR 04/25/2022 1232   LABSPEC <1.005 (L) 04/25/2022 1232   PHURINE 6.0 04/25/2022 1232   GLUCOSEU 100 (A) 04/25/2022 1232   HGBUR TRACE (A) 04/25/2022 1232   BILIRUBINUR NEGATIVE 04/25/2022 1232   KETONESUR NEGATIVE 04/25/2022 1232   PROTEINUR NEGATIVE 04/25/2022 1232   UROBILINOGEN 4.0 (H) 10/02/2015 2135   NITRITE POSITIVE (A) 04/25/2022 1232   LEUKOCYTESUR SMALL (A) 04/25/2022 1232   Recent Results (from the past 240 hour(s))  Aerobic/Anaerobic Culture w Gram Stain (surgical/deep wound)     Status: None   Collection Time: 08/11/22  4:42 PM   Specimen: Abscess   Result Value Ref Range Status   Specimen Description ABSCESS  Final   Special Requests LIVER  Final   Gram Stain NO ORGANISMS SEEN NO WBC SEEN   Final   Culture   Final    No growth aerobically or anaerobically. Performed at [facility], [place] [address], [place], [place] [address]    Report Status 08/16/2022 FINAL  Final     No results found.  Family Communication: Spouse at bedside  Disposition: Status is: Inpatient Remains inpatient appropriate because: Persistent abdominal pain, inadequate oral intake Planned Discharge Destination: CIR  Patrecia Pour, MD 08/17/2022 11:22 AM Page by [email or web address]

## 2022-08-17 NOTE — Progress Notes (Signed)
OT Cancellation Note  Patient Details Name: Douglas Hurst. MRN: [id number] DOB: [birth year]   Cancelled Treatment:    Reason Eval/Treat Not Completed: Pain limiting ability to participate  pt reports pain in ABD declining OOB mobility or ADLS at this time, will f/u as time allows for OT session.  Harley Alto., COTA/L Acute Rehabilitation Services [phone]   Precious Haws 08/17/2022, 2:52 PM

## 2022-08-18 DIAGNOSIS — J9601 Acute respiratory failure with hypoxia: Secondary | ICD-10-CM | POA: Diagnosis not present

## 2022-08-18 DIAGNOSIS — K858 Other acute pancreatitis without necrosis or infection: Secondary | ICD-10-CM | POA: Diagnosis not present

## 2022-08-18 DIAGNOSIS — I5033 Acute on chronic diastolic (congestive) heart failure: Secondary | ICD-10-CM | POA: Diagnosis not present

## 2022-08-18 DIAGNOSIS — E43 Unspecified severe protein-calorie malnutrition: Secondary | ICD-10-CM | POA: Diagnosis not present

## 2022-08-18 LAB — BASIC METABOLIC PANEL
Anion gap: 9 (ref 5–15)
BUN: 24 mg/dL — ABNORMAL HIGH (ref 8–23)
CO2: 31 mmol/L (ref 22–32)
Calcium: 9.5 mg/dL (ref 8.9–10.3)
Chloride: 92 mmol/L — ABNORMAL LOW (ref 98–111)
Creatinine, Ser: 0.87 mg/dL (ref 0.61–1.24)
GFR, Estimated: >60 mL/min (ref >60)
Glucose, Bld: 230 mg/dL — ABNORMAL HIGH (ref 70–99)
Potassium: 4.4 mmol/L (ref 3.5–5.1)
Sodium: 132 mmol/L — ABNORMAL LOW (ref 135–145)

## 2022-08-18 LAB — URINALYSIS, ROUTINE W REFLEX MICROSCOPIC
Bilirubin Urine: NEGATIVE
Glucose, UA: NEGATIVE mg/dL
Hgb urine dipstick: NEGATIVE
Ketones, ur: NEGATIVE mg/dL
Leukocytes,Ua: NEGATIVE
Nitrite: NEGATIVE
Protein, ur: NEGATIVE mg/dL
Specific Gravity, Urine: 1.013 (ref 1.005–1.030)
pH: 9 — ABNORMAL HIGH (ref 5.0–8.0)

## 2022-08-18 LAB — HEPARIN LEVEL (UNFRACTIONATED): Heparin Unfractionated: 0.32 IU/mL (ref 0.30–0.70)

## 2022-08-18 LAB — GLUCOSE, CAPILLARY
Glucose-Capillary: 229 mg/dL — ABNORMAL HIGH (ref 70–99)
Glucose-Capillary: 168 mg/dL — ABNORMAL HIGH (ref 70–99)
Glucose-Capillary: 229 mg/dL — ABNORMAL HIGH (ref 70–99)
Glucose-Capillary: 172 mg/dL — ABNORMAL HIGH (ref 70–99)
Glucose-Capillary: 221 mg/dL — ABNORMAL HIGH (ref 70–99)
Glucose-Capillary: 292 mg/dL — ABNORMAL HIGH (ref 70–99)

## 2022-08-18 MED ORDER — PROSOURCE TF20 ENFIT COMPATIBL EN LIQD
60.0000 mL | Freq: Three times a day (TID) | ENTERAL | Status: DC
  Administered 2022-08-18 – 2022-08-26 (×17): 60 mL
  Filled 2022-08-18 (×16): qty 60

## 2022-08-18 MED ORDER — OSMOLITE 1.5 CAL PO LIQD
1000.0000 mL | ORAL | Status: DC
  Administered 2022-08-18 – 2022-08-22 (×5): 1000 mL
  Filled 2022-08-18 (×9): qty 1000

## 2022-08-18 NOTE — Progress Notes (Signed)
Referring Physician(s): Dr Mathews Robinsons Dr Cleaster Corin   Supervising Physician: Corrie Mckusick  Patient Status:  [facility] - In-pt  Chief Complaint:  Perihepatic collection drain  Subjective:  Asleep; no complaints Denies much pain   Allergies: Patient has no known allergies.  Medications:  Current Facility-Administered Medications:    acetaminophen (TYLENOL) tablet 650 mg, 650 mg, Oral, Q6H PRN **OR** acetaminophen (TYLENOL) suppository 650 mg, 650 mg, Rectal, Q6H PRN, Clance Boll, MD   amiodarone (PACERONE) tablet 200 mg, 200 mg, Oral, Daily, Little Ishikawa, MD, 200 mg at 08/18/22 1020   feeding supplement (PROSource TF20) liquid 60 mL, 60 mL, Per Tube, Daily, Patrecia Pour, MD, 60 mL at 08/18/22 1024   feeding supplement (VITAL 1.5 CAL) liquid 1,000 mL, 1,000 mL, Per Tube, Continuous, Clarene Essex, MD, Last Rate: 60 mL/hr at 08/17/22 1955, 1,000 mL at 08/17/22 1955   fentaNYL (DURAGESIC) 12 MCG/HR 1 patch, 1 patch, Transdermal, Q72H, Patrecia Pour, MD, 1 patch at 08/16/22 1642   free water 150 mL, 150 mL, Per Tube, Q4H, Vance Gather B, MD, 150 mL at 08/18/22 1200   furosemide (LASIX) injection 80 mg, 80 mg, Intravenous, BID, Patrecia Pour, MD, 80 mg at 08/18/22 1022   heparin ADULT infusion 100 units/mL (25000 units/227m), 1,500 Units/hr, Intravenous, Continuous, GPatrecia Pour MD, Last Rate: 15 mL/hr at 08/17/22 2221, 1,500 Units/hr at 08/17/22 2221   HYDROcodone-acetaminophen (NORCO/VICODIN) 5-325 MG per tablet 1 tablet, 1 tablet, Oral, Q6H PRN, LLittle Ishikawa MD, 1 tablet at 08/17/22 1953   HYDROmorphone (DILAUDID) injection 0.5 mg, 0.5 mg, Intravenous, Q4H PRN, LLittle Ishikawa MD, 0.5 mg at 08/18/22 1022   insulin aspart (novoLOG) injection 0-9 Units, 0-9 Units, Subcutaneous, Q4H, GPatrecia Pour MD, 2 Units at 08/18/22 0830   insulin aspart (novoLOG) injection 3 Units, 3 Units, Subcutaneous, Q4H, GPatrecia Pour MD, 3 Units at 08/18/22 0830   metoprolol  tartrate (LOPRESSOR) tablet 25 mg, 25 mg, Oral, BID, GVance GatherB, MD, 25 mg at 08/18/22 1020   ondansetron (ZOFRAN) tablet 4 mg, 4 mg, Oral, Q6H PRN **OR** ondansetron (ZOFRAN) injection 4 mg, 4 mg, Intravenous, Q6H PRN, TClance Boll MD   polyethylene glycol (MIRALAX / GLYCOLAX) packet 17 g, 17 g, Oral, Daily, GPatrecia Pour MD, 17 g at 08/18/22 1022   senna-docusate (Senokot-S) tablet 1 tablet, 1 tablet, Oral, Daily, GPatrecia Pour MD, 1 tablet at 08/18/22 1020   tamsulosin (FLOMAX) capsule 0.8 mg, 0.8 mg, Oral, Daily, LLittle Ishikawa MD, 0.8 mg at 08/18/22 1020   umeclidinium bromide (INCRUSE ELLIPTA) 62.5 MCG/ACT 1 puff, 1 puff, Inhalation, Daily PRN, PTheotis Burrow RPH    Vital Signs: BP 129/63 (BP Location: Right Arm)   Pulse 79   Temp 97.9 F (36.6 C) (Oral)   Resp 16   Ht '6\' 1"'$  (1.854 m)   Wt 175 lb 7.8 oz (79.6 kg)   SpO2 96%   BMI 23.15 kg/m   Physical Exam Vitals reviewed.  Constitutional:      Appearance: He is ill-appearing.  Skin:    General: Skin is warm.     Comments: Drain is intact OP: Minimal today, only 30 cc yesterday after large volume >350 mL 8/20 NO growth on Cx  Flushes easily Clean and dry No bleeding; no sign of infection      Imaging: No results found.  Labs:  CBC: Recent Labs    08/14/22 0421 08/15/22 0431 08/16/22  3149 08/17/22 0308  WBC 9.0 9.1 10.4 11.3*  HGB 11.0* 10.5* 10.0* 10.2*  HCT 33.2* 32.2* 30.6* 30.4*  PLT 333 352 360 411*     COAGS: Recent Labs    04/26/22 0843 08/01/22 1832 08/02/22 0703 08/11/22 0941  INR 1.3*  --   --  1.1  APTT  --  121* 73*  --      BMP: Recent Labs    08/15/22 0431 08/16/22 0739 08/17/22 0308 08/18/22 0328  NA 135 129* 133* 132*  K 3.8 4.0 4.3 4.4  CL 97* 93* 94* 92*  CO2 30 27 34* 31  GLUCOSE 194* 265* 205* 230*  BUN '15 20 20 '$ 24*  CALCIUM 8.7* 8.5* 9.0 9.5  CREATININE 0.72 0.80 0.84 0.87  GFRNONAA >60 >60 >60 >60     LIVER FUNCTION TESTS: Recent  Labs    08/01/22 0717 08/02/22 0703 08/03/22 0819 08/10/22 0747  BILITOT 1.1 1.0 1.0 0.7  AST '22 19 18 17  '$ ALT '23 20 17 16  '$ ALKPHOS 53 72 64 73  PROT 5.2* 5.5* 5.0* 5.4*  ALBUMIN 2.4* 2.3* 2.0* 1.8*    Drain Location: RUQ Size: Fr size: 10 Fr Date of placement: 08/11/22  Currently to: Drain collection device: gravity 24 hour output:  Output by Drain (mL) 08/16/22 0701 - 08/16/22 1900 08/16/22 1901 - 08/17/22 0700 08/17/22 0701 - 08/17/22 1900 08/17/22 1901 - 08/18/22 0700 08/18/22 0701 - 08/18/22 1319  Closed System Drain 1 Right;Anterior;Lateral RUQ Other (Comment) 10 Fr. 105 250  30     Interval imaging/drain manipulation:  None  Current examination: Flushes/aspirates easily.  Insertion site unremarkable. Suture and stat lock in place. Dressed appropriately.   Plan: Continue TID flushes with 5 cc NS. Record output Q shift. Dressing changes QD or PRN if soiled.  Call IR APP or on call IR MD if difficulty flushing or sudden change in drain output.  Repeat imaging/possible drain injection once output < 10 mL/QD (excluding flush material). Consideration for drain removal if output is < 10 mL/QD (excluding flush material), pending discussion with the providing surgical service.  Discharge planning: Please contact IR APP or on call IR MD prior to patient d/c to ensure appropriate follow up plans are in place. Typically patient will follow up with IR clinic 10-14 days post d/c for repeat imaging/possible drain injection. IR scheduler will contact patient with date/time of appointment. Patient will need to flush drain QD with 5 cc NS, record output QD, dressing changes every 2-3 days or earlier if soiled.   IR will continue to follow - please call with questions or concerns.  Assessment and Plan:  Perihepatic collection drain intact Flushes easily No growth Will follow --CT ordered for tomorrow If home with drain-- IR scheduler will call pt with time and date of follow  up  Electronically Signed: Ascencion Dike, PA-C 08/18/2022, 1:19 PM   I spent a total of 15 Minutes at the the patient's bedside AND on the patient's hospital floor or unit, greater than 50% of which was counseling/coordinating care for perihepatic collection drain

## 2022-08-18 NOTE — Progress Notes (Signed)
Inpatient Rehab Admissions Coordinator:   Continuing to follow for medical readiness to open insurance.  Met with pt/spouse at bedside to update.    Shann Medal, PT, DPT Admissions Coordinator [phone] 08/18/22  12:42 PM

## 2022-08-18 NOTE — Progress Notes (Signed)
Occupational Therapy Treatment Patient Details Name: Douglas Hurst. MRN: [id number] DOB: [birth year] Today's Date: 08/18/2022   History of present illness Patient is a 81 yo male presenting to the ED with severe abdominal pain with radiation to right shoulder on 07/31/22. Patient had underwent ECRP for removal of stones from biliary and pancreatic duct on 8/3. Admitted with acute pancreatitis. Patient with coretrak and Perihepatic  drain placement.  PMH includes: bipolar d/o DMII, HTN, HLD, GERD, COPD ,  Afib ,diastolic heart failure, AAA with interim history of acute cholecystitis.   OT comments  Pt currently still needing mod assist for supine to sit with elevated HOB at approximately 30 degrees.  Min assist for static sitting balance with occasional posterior LOB.  He completed grooming tasks at min assist level EOB and then needed mod assist for sit to stand and for taking small, shuffling steps up toward the top of the bed.  Pt with questionable hypotensive episode once returned to sitting from standing.  Pt with blank start and list to the left with therapist quickly assisting him back to supine.  Pt more responsive after a few seconds but noted clamminess and slight perspiration.  BP taken in supine at 143/70 and 136/64.  Feel pt will continue to benefit from acute care OT to address current ADL dependence.  Still recommend AIR level therapy for follow-up at this time.    Recommendations for follow up therapy are one component of a multi-disciplinary discharge planning process, led by the attending physician.  Recommendations may be updated based on patient status, additional functional criteria and insurance authorization.    Follow Up Recommendations  Acute inpatient rehab (3hours/day)    Assistance Recommended at Discharge Frequent or constant Supervision/Assistance  Patient can return home with the following  Assist for transportation;Help with stairs or ramp for entrance;Direct  supervision/assist for medications management;A lot of help with walking and/or transfers;A lot of help with bathing/dressing/bathroom;Assistance with cooking/housework   Equipment Recommendations  Other (comment) (TBD next venue of care)       Precautions / Restrictions Precautions Precautions: Fall Precaution Comments: cortrak, flexiseal Restrictions Weight Bearing Restrictions: No       Mobility Bed Mobility Overal bed mobility: Needs Assistance Bed Mobility: Supine to Sit, Sit to Supine Rolling: Min guard Sidelying to sit: Mod assist, HOB elevated   Sit to supine: Max assist   General bed mobility comments: Pt needed mod assist to lift trunk up to midline when sitting up with max assist for sit to supine secondary to likely hypotensive episode.    Transfers Overall transfer level: Needs assistance Equipment used: Rolling walker (2 wheels) Transfers: Sit to/from Stand Sit to Stand: Mod assist     Step pivot transfers: Mod assist     General transfer comment: Mod demonstrational cueing for hand placement with sit to stand and stand to sit in order to take a few small steps toward the top of the bed.     Balance Overall balance assessment: Needs assistance Sitting-balance support: Feet supported, Bilateral upper extremity supported Sitting balance-Leahy Scale: Poor Sitting balance - Comments: posterior LOB in sitting   Standing balance support: During functional activity, Reliant on assistive device for balance Standing balance-Leahy Scale: Poor Standing balance comment: Mod assist for standing balance with use of the RW and therapist assist.                           ADL either performed or  assessed with clinical judgement   ADL Overall ADL's : Needs assistance/impaired     Grooming: Wash/dry face;Wash/dry hands;Set up                   Toilet Transfer: Moderate assistance;Stand-pivot;Rolling walker (2 wheels) Toilet Transfer Details  (indicate cue type and reason): simulated secondary to pt having flexiseal and primo fit         Functional mobility during ADLs: Moderate assistance (supine to sit with HOB elevated and for standing at the side of the bed.) General ADL Comments: Pt able to sit EOB for 10 mins while therapist assisted with washing her back and pt washed his face.  Sitting balance overall min assist secondary to posterior bais at times.  Oxygen sats at 95-96% on room air throughout session.  After standing and taking small steps up toward the top of the bed, he sat down and became non-responsive with therapist transferring him to supine quickly.  He was able to verbally respond immediately with BP taken at 143/70 and the 136/64.  HR stable throughout in the 70's.               Cognition Arousal/Alertness: Awake/alert Behavior During Therapy: WFL for tasks assessed/performed Overall Cognitive Status: Impaired/Different from baseline Area of Impairment: Problem solving, Awareness                   Current Attention Level: Sustained       Awareness: Intellectual Problem Solving: Requires verbal cues General Comments: Pt with greater attention to tasks than previous sessions, but not oriented to month.  Mod demonstrational cueing needed for hand placement with sit to stand and stand to sit.                   Pertinent Vitals/ Pain       Pain Assessment Pain Assessment: Faces Faces Pain Scale: Hurts a little bit Pain Location: nose where coretrak is placed Pain Descriptors / Indicators: Grimacing, Guarding, Discomfort Pain Intervention(s): Monitored during session         Frequency  Min 2X/week        Progress Toward Goals  OT Goals(current goals can now be found in the care plan section)  Progress towards OT goals: Not progressing toward goals - comment (goals updated based on LOS and slower progress than expected)  Acute Rehab OT Goals Patient Stated Goal: Pt did not state  but agreeable to participate in therapy OT Goal Formulation: With patient Time For Goal Achievement: 09/01/22 Potential to Achieve Goals: Good  Plan Discharge plan remains appropriate       AM-PAC OT "6 Clicks" Daily Activity     Outcome Measure   Help from another person eating meals?: None Help from another person taking care of personal grooming?: A Little Help from another person toileting, which includes using toliet, bedpan, or urinal?: A Lot Help from another person bathing (including washing, rinsing, drying)?: A Lot Help from another person to put on and taking off regular upper body clothing?: A Little Help from another person to put on and taking off regular lower body clothing?: A Lot 6 Click Score: 16    End of Session Equipment Utilized During Treatment: Rolling walker (2 wheels)  OT Visit Diagnosis: Unsteadiness on feet (R26.81);Muscle weakness (generalized) (M62.81);Other symptoms and signs involving cognitive function;Pain Pain - Right/Left: Right Pain - part of body:  (abdomen)   Activity Tolerance Patient limited by fatigue   Patient Left in bed;with  call bell/phone within reach;with bed alarm set   Nurse Communication Mobility status;Other (comment) (hypotensive episode)        Time: 1400-1431 OT Time Calculation (min): 31 min  Charges: OT General Charges $OT Visit: 1 Visit OT Treatments $Self Care/Home Management : 23-37 mins  Chuckie Mccathern OTR/L 08/18/2022, 3:00 PM

## 2022-08-18 NOTE — Progress Notes (Signed)
ANTICOAGULATION CONSULT NOTE - Follow Up Consult  Pharmacy Consult for Heparin Indication: atrial fibrillation  No Known Allergies  Patient Measurements: Height: '6\' 1"'$  (185.4 cm) Weight: 79.6 kg (175 lb 7.8 oz) IBW/kg (Calculated) : 79.9 Heparin Dosing Weight: 80 kg  Vital Signs: Temp: 97.9 F (36.6 C) (08/22 0425) Temp Source: Oral (08/22 0425) BP: 121/60 (08/22 0800) Pulse Rate: 74 (08/22 0800)  Labs: Recent Labs    08/16/22 0739 08/17/22 0308 08/18/22 0328  HGB 10.0* 10.2*  --   HCT 30.6* 30.4*  --   PLT 360 411*  --   HEPARINUNFRC 0.39 0.32 0.32  CREATININE 0.80 0.84 0.87     Estimated Creatinine Clearance: 77.5 mL/min (by C-G formula based on SCr of 0.87 mg/dL).  Assessment: Douglas Hurst is a 81 y/o male whose pertinent medication history includes Afib (on Eliquis prior to  admission) and CHF. Pharmacy has been consulted for IV heparin dosing, no bolus per MD. PTA Eliquis is on hold. His last dose of Eliquis was on 07/26/22 PTA.  Heparin level remains therapeutic at 0.32 IU/mL.  As of 8/21: Hgb stable. Platelet count stable.  No bleeding concerns or issues with the line noted. 8/21: Vomiting reported after eating lunch, per wife.   Goal of Therapy:  Heparin level 0.3-0.7 units/ml Monitor platelets by anticoagulation protocol: Yes   Plan:  Continue IV heparin drip at 1500 units/hr Monitor with daily HL and CBC Follow up for plans to transition back to Eliquis once tolerating PO and no procedures planned.   Thank you for allowing [place] to participate in this patients care. Jens Som, PharmD 08/18/2022 10:46 AM  **Pharmacist phone directory can be found on [email or web address] listed under [place]**

## 2022-08-18 NOTE — Progress Notes (Signed)
[facility] Gastroenterology Progress Note  Douglas Hurst. 81 y.o. 07-31-1941   Subjective: Weak. Intermittent abdominal pain. Wife in room and says he is "listless."  Objective: Vital signs: Vitals:   08/18/22 0800 08/18/22 1156  BP: 121/60 129/63  Pulse: 74 79  Resp: 17 16  Temp:  97.9 F (36.6 C)  SpO2: 94% 96%    Physical Exam: Gen: elderly, lethargic, thin, no acute distress  HEENT: anicteric sclera CV: RRR Chest: CTA B Abd: diffuse tenderness with guarding, soft, nondistended, +BS Ext: no edema  Lab Results: Recent Labs    08/15/22 1629 08/16/22 0739 08/17/22 0308 08/18/22 0328  NA  --    < > 133* 132*  K  --    < > 4.3 4.4  CL  --    < > 94* 92*  CO2  --    < > 34* 31  GLUCOSE  --    < > 205* 230*  BUN  --    < > 20 24*  CREATININE  --    < > 0.84 0.87  CALCIUM  --    < > 9.0 9.5  MG 1.8  --   --   --   PHOS 2.5  --   --   --    < > = values in this interval not displayed.   No results for input(s): "AST", "ALT", "ALKPHOS", "BILITOT", "PROT", "ALBUMIN" in the last 72 hours. Recent Labs    08/16/22 0739 08/17/22 0308  WBC 10.4 11.3*  HGB 10.0* 10.2*  HCT 30.6* 30.4*  MCV 90.3 88.9  PLT 360 411*      Assessment/Plan: Complicated pancreatitis pseudocysts with perihepatic drain (output down to 30 cc yesterday and none so far today). TFs at 60 cc/hr. On carb modified diet. Will repeat abd CT tomorrow. NPO p MN for CT. Supportive care.   Lear Ng 08/18/2022, 12:33 PM  Questions please call [phone] Patient ID: Douglas Hurst., male   DOB: [birth year], 81 y.o.   MRN: [id number]

## 2022-08-18 NOTE — Progress Notes (Signed)
Physical Therapy Treatment Patient Details Name: Douglas Hurst. MRN: [id number] DOB: 12/12/1941 Today's Date: 08/18/2022   History of Present Illness Patient is a 81 yo male presenting to the ED with severe abdominal pain with radiation to right shoulder on 07/31/22. Patient had underwent ECRP for removal of stones from biliary and pancreatic duct on 8/3. Admitted with acute pancreatitis. Patient with coretrak and Perihepatic  drain placement.  PMH includes: bipolar d/o DMII, HTN, HLD, GERD, COPD ,  Afib ,diastolic heart failure, AAA with interim history of acute cholecystitis.    PT Comments    Pt received in supine, agreeable to therapy session with encouragement, pt limited due to c/o low back pain and symptomatic orthostatic hypotension.  Pt may benefit from compression hose for BLE to see if this improves hemodynamics with transfers. Pt c/o pain and fatigue and needing up to modA for bed mobility and transfers, with orthostatic symptoms and fatigue preventing pt from progressing gait in room. Pt continues to benefit from PT services to progress toward functional mobility goals. Pt with minimal progress toward goals, may need to consider lower intensity therapies pending progress acutely, will continue to assess next session. Orthostatic BPs Sitting 100/52  Sitting after standing 90/46  Supine 140/70      Recommendations for follow up therapy are one component of a multi-disciplinary discharge planning process, led by the attending physician.  Recommendations may be updated based on patient status, additional functional criteria and insurance authorization.  Follow Up Recommendations  Acute inpatient rehab (3hours/day) (pending progress acutely; currently may require lower intensity)     Assistance Recommended at Discharge Frequent or constant Supervision/Assistance  Patient can return home with the following A lot of help with walking and/or transfers;A lot of help with  bathing/dressing/bathroom;Assistance with cooking/housework;Direct supervision/assist for medications management;Direct supervision/assist for financial management;Assist for transportation;Help with stairs or ramp for entrance   Equipment Recommendations  None recommended by PT (pending progress)    Recommendations for Other Services       Precautions / Restrictions Precautions Precautions: Fall Precaution Comments: cortrak, flexiseal, RUQ drain Restrictions Weight Bearing Restrictions: No     Mobility  Bed Mobility Overal bed mobility: Needs Assistance Bed Mobility: Rolling, Sidelying to Sit, Sit to Sidelying Rolling: Min assist Sidelying to sit: Mod assist, HOB elevated     Sit to sidelying: Mod assist General bed mobility comments: Pt needed mod assist to lift trunk up to midline when sitting up, tactile/verbal cues for UE placement and BLE assist to return to sidelying safely.    Transfers Overall transfer level: Needs assistance Equipment used: Rolling walker (2 wheels) Transfers: Sit to/from Stand Sit to Stand: Mod assist   Step pivot transfers: Mod assist, +2 safety/equipment       General transfer comment: Mod demonstrational cueing for hand placement with sit to stand and stand to sit in order to take a few small steps toward the top of the bed (and to simulate stand pivot transfer).    Ambulation/Gait             Pre-gait activities: sidesteps toward HOB shuffled and reliant on RW; L lean      Balance Overall balance assessment: Needs assistance Sitting-balance support: Feet supported, Bilateral upper extremity supported Sitting balance-Leahy Scale: Poor Sitting balance - Comments: L/posterior lean/LOB needing intermittent min guard for safety   Standing balance support: During functional activity, Reliant on assistive device for balance Standing balance-Leahy Scale: Poor Standing balance comment: Mod assist for standing balance  with use of the RW  and therapist assist.                            Cognition Arousal/Alertness: Awake/alert Behavior During Therapy: Flat affect Overall Cognitive Status: Impaired/Different from baseline Area of Impairment: Problem solving, Awareness, Safety/judgement                   Current Attention Level: Sustained     Safety/Judgement: Decreased awareness of safety Awareness: Intellectual Problem Solving: Requires verbal cues, Decreased initiation, Difficulty sequencing General Comments: Mod cues and increased time to initiate and perform tasks. Pt c/o pain and fatigue/likely orthostatic symptoms with standing although unable to stand long enough for full standing orthostatics to be taken.           General Comments General comments (skin integrity, edema, etc.): BP 90/46 seated EOB after standing then 139/65 after return to supine, RN notified pt requesting pain meds; HR not accurate due to leads coming off from chest, NT notified.      Pertinent Vitals/Pain Pain Assessment Pain Assessment: Faces Faces Pain Scale: Hurts little more Pain Location: R flank, low back Pain Descriptors / Indicators: Grimacing, Guarding, Discomfort Pain Intervention(s): Monitored during session, Repositioned, Limited activity within patient's tolerance, Patient requesting pain meds-RN notified     PT Goals (current goals can now be found in the care plan section) Acute Rehab PT Goals Patient Stated Goal: to get stronger and pain reduced. PT Goal Formulation: With patient/family Time For Goal Achievement: 08/17/22 Progress towards PT goals: Progressing toward goals (slow progress)    Frequency    Min 3X/week      PT Plan Current plan remains appropriate       AM-PAC PT "6 Clicks" Mobility   Outcome Measure  Help needed turning from your back to your side while in a flat bed without using bedrails?: A Little Help needed moving from lying on your back to sitting on the side of a  flat bed without using bedrails?: A Lot Help needed moving to and from a bed to a chair (including a wheelchair)?: A Lot Help needed standing up from a chair using your arms (e.g., wheelchair or bedside chair)?: A Lot Help needed to walk in hospital room?: Total Help needed climbing 3-5 steps with a railing? : Total 6 Click Score: 11    End of Session Equipment Utilized During Treatment: Gait belt Activity Tolerance: Patient limited by fatigue;Treatment limited secondary to medical complications (Comment) (symptomatic orthostatic hypotension) Patient left: in bed;with call bell/phone within reach;with bed alarm set;Other (comment) (encouraged pt to use IS and to get OOB to chair more during day) Nurse Communication: Mobility status;Other (comment) (pt may need to trial TED hose due to BP drop with transfers) PT Visit Diagnosis: Muscle weakness (generalized) (M62.81);Difficulty in walking, not elsewhere classified (R26.2);Other abnormalities of gait and mobility (R26.89);Pain Pain - Right/Left: Right Pain - part of body:  (low back pain)     Time: 4196-2229 PT Time Calculation (min) (ACUTE ONLY): 18 min  Charges:  $Therapeutic Activity: 8-22 mins                     Calianna Kim P., PTA Acute Rehabilitation Services Secure Chat Preferred 9a-5:30pm Office: [place] 08/18/2022, 6:03 PM

## 2022-08-18 NOTE — Progress Notes (Signed)
Progress Note  Patient: Douglas Hurst. WFU:[id number] DOB: [birth year]  DOA: 07/30/2022  DOS: 08/18/2022    Brief hospital course: Douglas Hurst. is a 81 y.o. male with a history of HFw/recoveredEF, PAF, NIDT2DM, HTN, HLD, GERD, COPD, and acute calculous cholecystitis and choledocholithiasis admitted here 4/29, discharged with cholecystostomy tube and discharged. He had AFib w/RVR and CHF during that admit, but with rate and rhythm control, EF normalized. He had unsuccessful ERCP and subsequent laparoscopic cholecystectomy 7/23 with inability to clear the bile duct. Referred for ERCP at [facility]. [facility] ERCP was performed 8/3 and he was discharged home only to return to [facility] with abdominal pain and post-ERCP pancreatitis with imaging concerning for necrosis. Also had difficulty urinating. IVF, IV antibiotics were started, and GI consulted on admission. Hospitalization complicated by AFib w/RVR which has since converted to NSR. Urinary retention improved on higher dose tamsulosin. He failed advancing diet on 8/10, repeat CT showed enlarging perihepatic fluid collections. IR placed drain 8/15.  Assessment and Plan: Acute pancreatitis-duodenitis with contained duodenal perforation (s/p rendezvous ERCP; transduodenal approach to puncture extrahepatic bile duct to place guidewire into duodenum-subsequently ERCP scope inserted and stone removed):  - CT repeated 8/13 showing fluid collections, enlarging. Favor reactive inflammation / pseudocyst.  - GI considering repeat CT with oral contrast early this coming week, also considering upper endoscopy.  No contrast into fluid collection on CT's. Note lipase was normal on admission. No pancreatic duct involvement in preceding procedure. ?if symptoms primarily related to duodenal inflammation/puncture. Initial concern for pancreatic head necrosis not borne out with serial exams, currently appears normal. - s/p aspiration and drain placement for perihepatic fluid  collection 8/15, output declining declining initially but still significant. Per IR, repeat CT once output is 10cc/24hrs. Culture negative. - Leukocytosis recurrent, checking UA as below. Anticipate repeat CT and/or endoscopic evaluation this week, defer to GI for timing. Will monitor leukocytosis and abd exam closely. - Pain control as ordered, continue fentanyl low dose patch, emphasize minimizing IV narcotics.  - s/p cortrak placed 8/16, continue TFs to supplement protein, but also cleared for diet.   Severe protein calorie malnutrition: As part of a general failure to thrive. - TF's and can push oral intake as well.  PAF with RVR: RVR precipitated by acute pancreatitis.  - Remains in NSR on amiodarone, metoprolol - Continue heparin gtt, plan to transition to DOAC once durably tolerating PO and no procedure planned.   Acute hypoxic respiratory failure: Due to pleural effusions, atelectasis, improved. - Attempt wean to room air today. Augmented diuresis as below. If unable to wean when felt to be euvolemic, would repeat CXR, consider thoracentesis. ?if related to CHF and/or abdominal process - Incentive spirometry encouraged  Ambulatory dysfunction: Pt very weakened/deconditioned. - Ordered OOB qShift if possible, continue PT/OT. Current recommendation has been for CIR.    AKI: Prerenal and suspected postrenal. Resolved.  - SCr is stable with IV diuresis. Monitor volume status  Acute urinary retention, BPH: Resolved retention, having UOP but less than would be expected based on weight change and aggressive diuresis.    - Continue tamsulosin 0.'8mg'$  daily  Constipation: Resolved.  - Continue miralax and senna once daily.   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  - Weight down overall, UOP improved with higher lasix dose, Cr stable, still on oxygen. Continue lasix '80mg'$  IV BID   -  Admit weight ~  80kg, up to 88.6kg 8/10, since declining   NIDT2DM: Well controlled chronically with HbA1c 5.6% July 2023.  - Continue novolog 3u q4h + sensitive scale    HTN:  - Continue meds   COPD: No wheezing.  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 Will monitor, With recent AFib, goal would be 4.0  Suprapubic tenderness:  - Check UA (reordered, not collected yesterday). Also has leukocytosis which has been attributed to inflammatory abdominal process.    Subjective: Minimal breakfast at time of my exam. Feels his breathing is getting better and eager to come off oxygen. Denies fevers or bleeding. Abdominal pain is intermittent, still severe enough to require IV dilaudid x3 in past 24 hours.  Objective: Vitals:   08/17/22 1940 08/17/22 2301 08/18/22 0425 08/18/22 0800  BP: 120/61 (!) 117/55 (!) 139/59 121/60  Pulse: 79 70 76 74  Resp: '18 16 17 17  '$ Temp: 97.6 F (36.4 C) 97.8 F (36.6 C) 97.9 F (36.6 C)   TempSrc: Oral Oral Oral   SpO2: 92% 92% 95% 94%  Weight:      Height:      Gen: 81 y.o. male in no distress Pulm: Nonlabored breathing supplemental oxygen. Remains diminished at bases without wheezes or crackles, normal effort and rate. CV: Regular rate and rhythm. No murmur, rub, or gallop. No JVD, no significant dependent edema. GI: Abdomen soft, moderately tender stable in upper/right quadrants/areas but also definite suprapubic tenderness. +BS, no rebound or guarding. Drain site intact, no exudate.   Ext: Warm, no deformities Skin: No new rashes, lesions or ulcers on visualized skin. No jaundice Neuro: Alert and oriented. No focal neurological deficits. Psych: Judgement and insight appear fair. Mood euthymic & affect congruent. Behavior is appropriate.     Data Personally reviewed: CBC: Recent Labs  Lab 08/13/22 0431 08/14/22 0421  08/15/22 0431 08/16/22 0739 08/17/22 0308  WBC 9.6 9.0 9.1 10.4 11.3*  HGB 10.8* 11.0* 10.5* 10.0* 10.2*  HCT 33.1* 33.2* 32.2* 30.6* 30.4*  MCV 89.9 90.2 90.7 90.3 88.9  PLT 300 333 352 360 732*   Basic Metabolic Panel: Recent Labs  Lab 08/13/22 1609 08/14/22 0421 08/14/22 1753 08/15/22 0431 08/15/22 1629 08/16/22 0739 08/17/22 0308 08/18/22 0328  NA  --  136  --  135  --  129* 133* 132*  K  --  3.7  --  3.8  --  4.0 4.3 4.4  CL  --  99  --  97*  --  93* 94* 92*  CO2  --  28  --  30  --  27 34* 31  GLUCOSE  --  204*  --  194*  --  265* 205* 230*  BUN  --  15  --  15  --  20 20 24*  CREATININE  --  0.79  --  0.72  --  0.80 0.84 0.87  CALCIUM  --  8.7*  --  8.7*  --  8.5* 9.0 9.5  MG 2.0 1.9 2.0 1.8 1.8  --   --   --   PHOS 2.6 2.2* 2.9 2.6 2.5  --   --   --    GFR: Estimated Creatinine Clearance: 77.5 mL/min (by C-G formula based on SCr of 0.87 mg/dL). Liver Function Tests: No results for input(s): "AST", "ALT", "ALKPHOS", "BILITOT", "PROT", "ALBUMIN" in the last 168 hours.  Coagulation Profile: Recent Labs  Lab 08/11/22  0941  INR 1.1   CBG: Recent Labs  Lab 08/17/22 1652 08/17/22 1942 08/17/22 2329 08/18/22 0440 08/18/22 0801  GLUCAP 223* 181* 173* 229* 168*   Urine analysis:    Component Value Date/Time   COLORURINE YELLOW 04/25/2022 1232   APPEARANCEUR CLEAR 04/25/2022 1232   LABSPEC <1.005 (L) 04/25/2022 1232   PHURINE 6.0 04/25/2022 1232   GLUCOSEU 100 (A) 04/25/2022 1232   HGBUR TRACE (A) 04/25/2022 1232   BILIRUBINUR NEGATIVE 04/25/2022 1232   KETONESUR NEGATIVE 04/25/2022 1232   PROTEINUR NEGATIVE 04/25/2022 1232   UROBILINOGEN 4.0 (H) 10/02/2015 2135   NITRITE POSITIVE (A) 04/25/2022 1232   LEUKOCYTESUR SMALL (A) 04/25/2022 1232   Recent Results (from the past 240 hour(s))  Aerobic/Anaerobic Culture w Gram Stain (surgical/deep wound)     Status: None   Collection Time: 08/11/22  4:42 PM   Specimen: Abscess  Result Value Ref Range  Status   Specimen Description ABSCESS  Final   Special Requests LIVER  Final   Gram Stain NO ORGANISMS SEEN NO WBC SEEN   Final   Culture   Final    No growth aerobically or anaerobically. Performed at [facility], [place] [address], [place], [place] [address]    Report Status 08/16/2022 FINAL  Final      Family Communication: None at bedside  Disposition: Status is: Inpatient Remains inpatient appropriate because: Persistent abdominal pain, inadequate oral intake Planned Discharge Destination: CIR  Patrecia Pour, MD 08/18/2022 9:30 AM Page by [email or web address]

## 2022-08-18 NOTE — Progress Notes (Signed)
Nutrition Follow-up  DOCUMENTATION CODES:   Severe malnutrition in context of chronic illness  INTERVENTION:   Tube Feeds via Cortrak: Transition to nocturnal feeds, Osmolite 1.5 at 80 mL/hr x 12 hours (6 pm- 6 am) 60 mL ProSource TF 20 - TID 150 mL free water flush q4h  Provides 1680 kcal, 120 gm of protein, and 1631 mL free water daily.  Liberalize pt diet to regular to increase pt menu options  NUTRITION DIAGNOSIS:   Severe Malnutrition related to chronic illness (COPD, HF, acute pancreatitis) as evidenced by severe fat depletion, moderate fat depletion, severe muscle depletion, moderate muscle depletion. - Ongoing   GOAL:   Patient will meet greater than or equal to 90% of their needs - Being met via TF  MONITOR:   PO intake, I & O's, Labs, TF tolerance  REASON FOR ASSESSMENT:   Consult Enteral/tube feeding initiation and management  ASSESSMENT:   Pt admitted with abdomina pain s/p ERCP, found to have acute pancreatitis. PMH significant for bipolar d/o, T2DM, HTN, HLD, GERD, COPD, Afib, dHF, AAA, interim h/o acute cholecystitis. S/p laparoscopic cholecystectomy with laparoscopic common duct exploration 7/23  8/11 - diet advanced to full liquids 8/14 - diet advanced to SOFT diet  8/15 - s/p CT guided abdominal drain placement; diet downgraded to full liquids 8/16 - NPO; Cortrak placed (tip near distal pylorus/proximal duodenum; TF started  8/19 - diet advanced to regular  Pt resting in bed, wife at bedside. Reports that he is doing ok, eating a little bit thus far. Denies any nausea, vomiting, and abdominal pain. Wife states that they are having difficulty ordering items that pt likes and wants to eat due to carb restriction; RD to remove.  Discussed transitioning to nocturnal tube feeds and encouraging pt to eat more during the day. Discussed with wife that she can bring in food from home if helpful.  Meal completions documents: 25-50% x 6 meals   Pt going for repeat  abdominal CT tomorrow. NPO after midnight, discussed with RN to turn off TF at midnight for procedure.   Discussed with primary MD and GI.   Medications reviewed and include: Lasix, NovoLog, Miralax, Senokot-S Labs reviewed: Sodium 132, BUN 24, 24 hr CBG 173-297   Diet Order:   Diet Order             Diet NPO time specified  Diet effective midnight           Diet regular Room service appropriate? Yes; Fluid consistency: Thin  Diet effective now                   EDUCATION NEEDS:   No education needs have been identified at this time  Skin:  Skin Assessment: Skin Integrity Issues: Skin Integrity Issues:: Other (Comment) Other: RU abdomen drain  Last BM:  8/21  Height:   Ht Readings from Last 1 Encounters:  07/30/22 _0  (1.854 m)    Weight:   Wt Readings from Last 1 Encounters:  08/17/22 79.6 kg    Ideal Body Weight:  83.6 kg  BMI:  Body mass index is 23.15 kg/m.  Estimated Nutritional Needs:   Kcal:  2200-2400  Protein:  115-130g  Fluid:  >/=2L    Hermina Barters RD, LDN Clinical Dietitian See [facility] for contact information.

## 2022-08-19 ENCOUNTER — Inpatient Hospital Stay (HOSPITAL_COMMUNITY): Payer: Medicare HMO

## 2022-08-19 DIAGNOSIS — E43 Unspecified severe protein-calorie malnutrition: Secondary | ICD-10-CM | POA: Diagnosis not present

## 2022-08-19 DIAGNOSIS — K859 Acute pancreatitis without necrosis or infection, unspecified: Secondary | ICD-10-CM | POA: Diagnosis not present

## 2022-08-19 DIAGNOSIS — K9189 Other postprocedural complications and disorders of digestive system: Secondary | ICD-10-CM | POA: Diagnosis not present

## 2022-08-19 LAB — BASIC METABOLIC PANEL
Anion gap: 10 (ref 5–15)
BUN: 39 mg/dL — ABNORMAL HIGH (ref 8–23)
CO2: 29 mmol/L (ref 22–32)
Calcium: 10.1 mg/dL (ref 8.9–10.3)
Chloride: 93 mmol/L — ABNORMAL LOW (ref 98–111)
Creatinine, Ser: 1.04 mg/dL (ref 0.61–1.24)
GFR, Estimated: >60 mL/min (ref >60)
Glucose, Bld: 146 mg/dL — ABNORMAL HIGH (ref 70–99)
Potassium: 4.5 mmol/L (ref 3.5–5.1)
Sodium: 132 mmol/L — ABNORMAL LOW (ref 135–145)

## 2022-08-19 LAB — CBC
HCT: 33.5 % — ABNORMAL LOW (ref 39.0–52.0)
Hemoglobin: 11.2 g/dL — ABNORMAL LOW (ref 13.0–17.0)
MCH: 29.6 pg (ref 26.0–34.0)
MCHC: 33.4 g/dL (ref 30.0–36.0)
MCV: 88.4 fL (ref 80.0–100.0)
Platelets: 428 10*3/uL — ABNORMAL HIGH (ref 150–400)
RBC: 3.79 MIL/uL — ABNORMAL LOW (ref 4.22–5.81)
RDW: 15.6 % — ABNORMAL HIGH (ref 11.5–15.5)
WBC: 12.1 10*3/uL — ABNORMAL HIGH (ref 4.0–10.5)
nRBC: 0 % (ref 0.0–0.2)

## 2022-08-19 LAB — GLUCOSE, CAPILLARY
Glucose-Capillary: 145 mg/dL — ABNORMAL HIGH (ref 70–99)
Glucose-Capillary: 192 mg/dL — ABNORMAL HIGH (ref 70–99)
Glucose-Capillary: 200 mg/dL — ABNORMAL HIGH (ref 70–99)
Glucose-Capillary: 196 mg/dL — ABNORMAL HIGH (ref 70–99)
Glucose-Capillary: 247 mg/dL — ABNORMAL HIGH (ref 70–99)
Glucose-Capillary: 237 mg/dL — ABNORMAL HIGH (ref 70–99)

## 2022-08-19 LAB — HEPARIN LEVEL (UNFRACTIONATED): Heparin Unfractionated: 0.4 IU/mL (ref 0.30–0.70)

## 2022-08-19 MED ORDER — IOHEXOL 300 MG/ML  SOLN
100.0000 mL | Freq: Once | INTRAMUSCULAR | Status: AC | PRN
  Administered 2022-08-19: 100 mL via INTRAVENOUS

## 2022-08-19 NOTE — Progress Notes (Addendum)
Referring Physician(s): Aileen Fass   Supervising Physician: Sandi Mariscal  Patient Status:  [facility] - In-pt  Chief Complaint:  Right hepatic abscess Drain placed in IR 8/15  Subjective:  Feeling some better Up in bed  Has had CT today: IMPRESSION: There is interval decrease in loculated perihepatic fluid collection after placement of percutaneous drain. There is 3.7 x 1.5 cm loculated fluid collection along the anterior margin of head of the pancreas with interval decrease in size suggesting resolving pseudocyst. There are no new loculated fluid collections in or around the pancreas.  OP is bloody 25 cc yesterday; 30 cc day before     Allergies: Patient has no known allergies.  Medications: Prior to Admission medications   Medication Sig Start Date End Date Taking? Authorizing Provider  acetaminophen (TYLENOL) 325 MG tablet Take 2 tablets (650 mg total) by mouth every 6 (six) hours as needed for mild pain (or Fever >/= 101). 04/30/22  Yes Sheikh, Omair Latif, DO  amiodarone (PACERONE) 200 MG tablet Take 1 tablet (200 mg total) by mouth daily. 05/29/22  Yes Loel Dubonnet, NP  atorvastatin (LIPITOR) 40 MG tablet Take 40 mg by mouth every evening.   Yes [provider]  lisinopril-hydrochlorothiazide (ZESTORETIC) 10-12.5 MG tablet Take 0.5 tablets by mouth daily.   Yes [provider]  metFORMIN (GLUCOPHAGE-XR) 500 MG 24 hr tablet Take 500-1,000 mg by mouth See admin instructions. Take 2 tablets (1000 mg) by mouth in the morning and 1 tablet (500 mg) every evening 03/17/22  Yes [provider]  metoprolol tartrate (LOPRESSOR) 25 MG tablet Take 1 tablet (25 mg total) by mouth 2 (two) times daily. 05/29/22  Yes Loel Dubonnet, NP  Multiple Vitamin (MULTIVITAMIN WITH MINERALS) TABS tablet Take 1 tablet by mouth daily.   Yes [provider]  ondansetron (ZOFRAN) 4 MG tablet Take 1 tablet (4 mg total) by mouth every 6 (six) hours as needed for  nausea. 04/30/22  Yes Sheikh, Omair Latif, DO  pantoprazole (PROTONIX) 40 MG tablet Take 40 mg by mouth every morning.   Yes [provider]  polyethylene glycol powder (GLYCOLAX/MIRALAX) 17 GM/SCOOP powder Take 17 g by mouth daily. Patient taking differently: Take 17 g by mouth daily as needed for mild constipation or moderate constipation. 04/30/22  Yes Sheikh, Omair Latif, DO  senna-docusate (SENOKOT-S) 8.6-50 MG tablet Take 1 tablet by mouth at bedtime. Patient taking differently: Take 1 tablet by mouth at bedtime as needed for mild constipation or moderate constipation. 04/30/22  Yes Sheikh, Omair Latif, DO  SPIRIVA RESPIMAT 2.5 MCG/ACT AERS Inhale 2 puffs into the lungs daily as needed (COPD). 06/04/22  Yes [provider]  tadalafil (CIALIS) 20 MG tablet Take 20 mg by mouth every three (3) days as needed for erectile dysfunction. 03/29/22  Yes [provider]  tamsulosin (FLOMAX) 0.4 MG CAPS capsule Take 0.4 mg by mouth daily after supper. 05/13/22  Yes [provider]  apixaban (ELIQUIS) 5 MG TABS tablet Take 1 tablet (5 mg total) by mouth 2 (two) times daily. Patient not taking: Reported on 07/31/2022 05/29/22   Loel Dubonnet, NP  oxyCODONE-acetaminophen (PERCOCET) 5-325 MG tablet Take 1 tablet by mouth every 4 (four) hours as needed for severe pain. Patient not taking: Reported on 07/31/2022 07/14/22 07/14/23  Norm Parcel, PA-C     Vital Signs: BP (!) 126/59 (BP Location: Right Arm)   Pulse 90   Temp 97.8 F (36.6 C) (Oral)  Resp (!) 24   Ht '6\' 1"'$  (1.854 m)   Wt 153 lb 14.1 oz (69.8 kg)   SpO2 97%   BMI 20.30 kg/m   Physical Exam Vitals reviewed.  Skin:    General: Skin is warm.     Comments: Site is clean anddry NT no bleeding No sign of infection OP bloody 25 cc yesterday     Imaging: CT ABDOMEN PELVIS W CONTRAST  Result Date: 08/19/2022 CLINICAL DATA:  Pancreatitis, follow-up of pseudocyst EXAM: CT ABDOMEN AND PELVIS WITH CONTRAST  TECHNIQUE: Multidetector CT imaging of the abdomen and pelvis was performed using the standard protocol following bolus administration of intravenous contrast. RADIATION DOSE REDUCTION: This exam was performed according to the departmental dose-optimization program which includes automated exposure control, adjustment of the mA and/or kV according to patient size and/or use of iterative reconstruction technique. CONTRAST:  123m OMNIPAQUE IOHEXOL 300 MG/ML  SOLN COMPARISON:  Previous studies including the examination of 08/09/2022 FINDINGS: Lower chest: There is interval decrease in bilateral pleural effusions. Small residual bilateral effusions are seen, more so on the right side. There is interval improvement in the aeration of lower lung fields with residual atelectasis/pneumonia in both lower lung fields, more so on the right side. Coronary artery calcifications are seen. Hepatobiliary: There is interval placement of percutaneous drain into the loculated fluid collection along the lateral aspect of the liver. There is partial clearing of loculated perihepatic fluid collection. In the current study, maximum transverse diameter of the collection measures 3 cm. No focal abnormalities are seen in liver parenchyma. There is no dilation of bile ducts. Surgical clips are seen in gallbladder fossa. Pancreas: There is 3.7 x 1.5 cm loculated fluid collection along the anterior aspect of head of the pancreas with interval decrease in size. Head of the pancreas is larger in comparison to the body and tail with adjacent stranding suggesting resolving pancreatitis. There are no new loculated fluid collections in or around the pancreas. Spleen: Unremarkable. Adrenals/Urinary Tract: Hyperplasia of left adrenal and possible subcentimeter low-density in the right adrenal appear stable. There is no hydronephrosis. There is 1.9 cm cyst in lateral upper pole of right kidney with no change. There are scattered arterial  calcifications. No renal or ureteral stones are seen. Urinary bladder is not distended. There is mild diffuse wall thickening in the bladder. Stomach/Bowel: Tip of enteric tube is seen in the distal antrum of stomach. Small bowel loops are not dilated. The appendix is not seen. There is no wall thickening in colon. Scattered diverticula are seen in colon without signs of focal acute diverticulitis. 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 There is ectasia of left common iliac artery measuring 1.6 cm. Reproductive: Prostate is enlarged projecting into the base of the bladder. Other: There is no pneumoperitoneum or significant ascites. Umbilical hernia containing fat is seen. Musculoskeletal: Degenerative changes are noted in lumbar spine. IMPRESSION: There is interval decrease in loculated perihepatic fluid collection after placement of percutaneous drain. There is 3.7 x 1.5 cm loculated fluid collection along the anterior margin of head of the pancreas with interval decrease in size suggesting resolving pseudocyst. There are no new loculated fluid collections in or around the pancreas. There is no evidence of intestinal obstruction or pneumoperitoneum. There is no hydronephrosis. There is interval decrease in bilateral pleural effusions with small residual effusions on both sides, more so on the right side. There is interval decrease  in patchy infiltrates in both lower lung fields suggesting resolving atelectasis/pneumonia. 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 Follow-up imaging every 2 years may be considered. Coronary artery disease. Enlarged prostate. Other findings as described in the body of the report. Electronically Signed   By: Elmer Picker M.D.   On: 08/19/2022 13:41     Labs:  CBC: Recent Labs    08/15/22 0431 08/16/22 0739 08/17/22 0308 08/19/22 0448  WBC 9.1 10.4 11.3* 12.1*  HGB 10.5* 10.0* 10.2* 11.2*  HCT 32.2* 30.6* 30.4* 33.5*  PLT 352 360 411* 428*    COAGS: Recent Labs    04/26/22 0843 08/01/22 1832 08/02/22 0703 08/11/22 0941  INR 1.3*  --   --  1.1  APTT  --  121* 73*  --     BMP: Recent Labs    08/16/22 0739 08/17/22 0308 08/18/22 0328 08/19/22 0448  NA 129* 133* 132* 132*  K 4.0 4.3 4.4 4.5  CL 93* 94* 92* 93*  CO2 27 34* 31 29  GLUCOSE 265* 205* 230* 146*  BUN 20 20 24* 39*  CALCIUM 8.5* 9.0 9.5 10.1  CREATININE 0.80 0.84 0.87 1.04  GFRNONAA >60 >60 >60 >60    LIVER FUNCTION TESTS: Recent Labs    08/01/22 0717 08/02/22 0703 08/03/22 0819 08/10/22 0747  BILITOT 1.1 1.0 1.0 0.7  AST '22 19 18 17  '$ ALT '23 20 17 16  '$ ALKPHOS 53 72 64 73  PROT 5.2* 5.5* 5.0* 5.4*  ALBUMIN 2.4* 2.3* 2.0* 1.8*    Drain Location: perihepatic drain Size: Fr size: 10 Fr Date of placement: 08/11/22  Currently to: Drain collection device: gravity 24 hour output:  Output by Drain (mL) 08/17/22 0701 - 08/17/22 1900 08/17/22 1901 - 08/18/22 0700 08/18/22 0701 - 08/18/22 1900 08/18/22 1901 - 08/19/22 0700 08/19/22 0701 - 08/19/22 1407  Closed System Drain 1 Right;Anterior;Lateral RUQ Other (Comment) 10 Fr.  30 25 0     Interval imaging/drain manipulation:  CT today  Current examination: Flushes/aspirates easily.  Insertion site unremarkable. Suture and stat lock in place. Dressed appropriately.  OP still 25-35 cc daily: bloody OP   Plan: Continue TID flushes with 5 cc NS. Record output Q shift. Dressing changes QD or PRN if soiled.  Call IR APP or on call IR MD if difficulty flushing or sudden change in drain output.  Cx: NG  Discharge planning: Please contact IR APP or on call IR MD prior to patient d/c to ensure appropriate follow up plans are in place. Typically patient will follow up with IR clinic 10-14 days  post d/c for repeat imaging/possible drain injection. IR scheduler will contact patient with date/time of appointment. Patient will need to flush drain QD with 5 cc NS, record output QD, dressing changes every 2-3 days or earlier if soiled.   IR will continue to follow - please call with questions or concerns. Will discuss CT findings with IR Rad      Assessment and Plan:  CT done today OP bloody: 25-35 cc per day NGTD Will discuss with IR Rad but OP still significant  Electronically Signed: Lavonia Drafts, PA-C 08/19/2022, 2:07 PM   I spent a total of 15 Minutes at the the patient's bedside AND on the patient's hospital floor or unit, greater than 50% of which was counseling/coordinating care for  perihepatic abscess drain

## 2022-08-19 NOTE — Progress Notes (Addendum)
[facility] PROGRESS NOTE    Progress Note  [name]  FOY:[id number] DOB: September 27, 1941 DOA: 07/30/2022 PCP: Lavone Orn, MD     Brief Narrative:   Deshaun Weisinger. is an 81 y.o. male past medical history of heart failure with recovered EF, paroxysmal atrial fibrillation on amiodarone and apixaban, insulin-dependent diabetes mellitus type 2 COPD and history of a calculus cholecystitis recently discharged on April of this month with a percutaneous cholecystectomy tube, during that time he had A-fib with RVR, he also had a unsuccessful ERCP and subsequent laparoscopic cholecystectomy on 07/19/2022 with an inability to clear the bile duct, referred to [facility] for ERCP which was performed on 07/30/2022 and was discharged home returns to the ED for abdominal pain post ERCP pancreatitis with imaging concerning for necrosis GI was consulted started empirically on antibiotics and IV fluids hospitalization was complicated by A-fib with RVR now converted to sinus rhythm.  Also developed urinary retention which has improved with Flomax.  We were unable to advance his diet on 08/06/2022 CT scan showed a large perihepatic fluid collection IR was consulted and placed a drain on 08/11/2022   Assessment/Plan:   Post ERCP pancreatitis with necrosis: CT scan 08/09/2022 showed fluid collection favoring reactive/pseudocyst. GI was consulted repeated CT with oral contrast showed no fluid collection. Status post aspiration and drain placement of perihepatic fluid collection 08/11/2022, output decline. IR recommended to repeat CT once output less than 10 cc in 24 hours culture has been negative.  At about 30 cc of output through the tube yesterday. Pain control with fentanyl. Status post core track placement May 12, 2022 GI on board recommended a CT scan of the abdomen pelvis today. PT evaluated the patient, will need inpatient rehab. Relates his pain is controlled. Last bowel movement was 2 days  ago.  Protein-calorie malnutrition, severe Advance oral intake.  Paroxysmal atrial fibrillation with RVR: Likely precipitated by acute pancreatitis remains on amiodarone now in sinus rhythm continue metoprolol. Continue IV heparin can plan to transition to oral DOAC once no further interventions are needed.  Acute respiratory failure with hypoxia: Likely due to pleural effusion.  The try to wean off to room air yesterday but were unsuccessful. Pushing diuresis. Encourage incentive spirometry.  Generalized weakness: Continue to work with physical therapy has been recommended for inpatient rehab.  Acute kidney injury: Resolved with diuresis creatinine has returned to baseline. Hold Lasix for today is currently on free water 150 every 4 hours. Recheck basic metabolic panel tomorrow morning.  Acute urinary retention/BPH: Continue Flomax.  Constipation: Resolved with MiraLAX.  Hypophosphatemia: Supplemented now resolved.  Acute on chronic diastolic heart failure: With a recovery EF weight is down overall with good urine output creatinine is slowly rising currently on IV Lasix twice a day we will hold. Weight has improved since admission.  Well-controlled diabetes mellitus type 2: With an A1c of 5.6 continue NovoLog on sliding scale insulin.  Essential hypertension: Well-controlled continue current regimen.  COPD: Continue inhalers.  Infrarenal abdominal aortic aneurysm: Measuring 3.4 cm follow-up with vascular as an outpatient.  Hypokalemia: Replete orally now resolved.    DVT prophylaxis: heparin Family Communication:none Status is: Inpatient Remains inpatient appropriate because: Post ERCP pancreatitis    Code Status:     Code Status Orders  (From admission, onward)           Start     Ordered   07/31/22 0233  Full code  Continuous  07/31/22 0235           Code Status History     Date Active Date Inactive Code Status Order ID Comments  User Context   07/08/2022 1532 07/14/2022 1956 Full Code [id number]  Felicie Morn, MD Inpatient   04/25/2022 1512 04/30/2022 2147 Full Code [id number]  Orma Flaming, MD ED         IV Access:   Peripheral IV   Procedures and diagnostic studies:   No results found.   Medical Consultants:   None.   Subjective:    [name] no complaints.  Objective:    Vitals:   08/19/22 0500 08/19/22 0800 08/19/22 0830 08/19/22 1122  BP:  (!) 102/47  (!) 126/59  Pulse:  87  90  Resp:  (!) 21  (!) 24  Temp:  98.7 F (37.1 C)  97.8 F (36.6 C)  TempSrc:  Axillary  Oral  SpO2:  95% 96% 97%  Weight: 69.8 kg     Height:       SpO2: 97 % O2 Flow Rate (L/min): 0.5 L/min   Intake/Output Summary (Last 24 hours) at 08/19/2022 1308 Last data filed at 08/19/2022 0409 Gross per 24 hour  Intake 50 ml  Output 1625 ml  Net -1575 ml   Filed Weights   08/16/22 0500 08/17/22 0439 08/19/22 0500  Weight: 75.5 kg 79.6 kg 69.8 kg    Exam: General exam: In no acute distress. Respiratory system: Good air movement and clear to auscultation. Cardiovascular system: S1 & S2 heard, RRR. No JVD. Gastrointestinal system: Abdomen is nondistended, soft and nontender, drain in place Extremities: No pedal edema. Skin: No rashes, lesions or ulcers Psychiatry: Judgement and insight appear normal. Mood & affect appropriate.    Data Reviewed:    Labs: Basic Metabolic Panel: Recent Labs  Lab 08/13/22 1609 08/14/22 0421 08/14/22 1753 08/15/22 0431 08/15/22 1629 08/16/22 0739 08/17/22 0308 08/18/22 0328 08/19/22 0448  NA  --  136  --  135  --  129* 133* 132* 132*  K  --  3.7  --  3.8  --  4.0 4.3 4.4 4.5  CL  --  99  --  97*  --  93* 94* 92* 93*  CO2  --  28  --  30  --  27 34* 31 29  GLUCOSE  --  204*  --  194*  --  265* 205* 230* 146*  BUN  --  15  --  15  --  20 20 24* 39*  CREATININE  --  0.79  --  0.72  --  0.80 0.84 0.87 1.04  CALCIUM  --  8.7*  --  8.7*  --  8.5* 9.0  9.5 10.1  MG 2.0 1.9 2.0 1.8 1.8  --   --   --   --   PHOS 2.6 2.2* 2.9 2.6 2.5  --   --   --   --    GFR Estimated Creatinine Clearance: 56.9 mL/min (by C-G formula based on SCr of 1.04 mg/dL). Liver Function Tests: No results for input(s): "AST", "ALT", "ALKPHOS", "BILITOT", "PROT", "ALBUMIN" in the last 168 hours. No results for input(s): "LIPASE", "AMYLASE" in the last 168 hours. No results for input(s): "AMMONIA" in the last 168 hours. Coagulation profile No results for input(s): "INR", "PROTIME" in the last 168 hours. COVID-19 Labs  No results for input(s): "DDIMER", "FERRITIN", "LDH", "CRP" in the last 72 hours.  No results found for: "  [place]"  CBC: Recent Labs  Lab 08/14/22 0421 08/15/22 0431 08/16/22 0739 08/17/22 0308 08/19/22 0448  WBC 9.0 9.1 10.4 11.3* 12.1*  HGB 11.0* 10.5* 10.0* 10.2* 11.2*  HCT 33.2* 32.2* 30.6* 30.4* 33.5*  MCV 90.2 90.7 90.3 88.9 88.4  PLT 333 352 360 411* 428*   Cardiac Enzymes: No results for input(s): "CKTOTAL", "CKMB", "CKMBINDEX", "TROPONINI" in the last 168 hours. BNP (last 3 results) No results for input(s): "PROBNP" in the last 8760 hours. CBG: Recent Labs  Lab 08/18/22 1956 08/18/22 2322 08/19/22 0441 08/19/22 0832 08/19/22 1121  GLUCAP 221* 292* 145* 192* 200*   D-Dimer: No results for input(s): "DDIMER" in the last 72 hours. Hgb A1c: No results for input(s): "HGBA1C" in the last 72 hours. Lipid Profile: No results for input(s): "CHOL", "HDL", "LDLCALC", "TRIG", "CHOLHDL", "LDLDIRECT" in the last 72 hours. Thyroid function studies: No results for input(s): "TSH", "T4TOTAL", "T3FREE", "THYROIDAB" in the last 72 hours.  Invalid input(s): "FREET3" Anemia work up: No results for input(s): "VITAMINB12", "FOLATE", "FERRITIN", "TIBC", "IRON", "RETICCTPCT" in the last 72 hours. Sepsis Labs: Recent Labs  Lab 08/15/22 0431 08/16/22 0739 08/17/22 0308 08/19/22 0448  WBC 9.1 10.4 11.3* 12.1*    Microbiology Recent Results (from the past 240 hour(s))  Aerobic/Anaerobic Culture w Gram Stain (surgical/deep wound)     Status: None   Collection Time: 08/11/22  4:42 PM   Specimen: Abscess  Result Value Ref Range Status   Specimen Description ABSCESS  Final   Special Requests LIVER  Final   Gram Stain NO ORGANISMS SEEN NO WBC SEEN   Final   Culture   Final    No growth aerobically or anaerobically. Performed at [facility], [place] [address] [place], [place] [address]    Report Status 08/16/2022 FINAL  Final     Medications:    amiodarone  200 mg Oral Daily   feeding supplement (OSMOLITE 1.5 CAL)  1,000 mL Per Tube Q24H   feeding supplement (PROSource TF20)  60 mL Per Tube TID   fentaNYL  1 patch Transdermal Q72H   free water  150 mL Per Tube Q4H   furosemide  80 mg Intravenous BID   insulin aspart  0-9 Units Subcutaneous Q4H   insulin aspart  3 Units Subcutaneous Q4H   metoprolol tartrate  25 mg Oral BID   polyethylene glycol  17 g Oral Daily   senna-docusate  1 tablet Oral Daily   tamsulosin  0.8 mg Oral Daily   Continuous Infusions:  heparin 1,500 Units/hr (08/19/22 0916)      LOS: 19 days   Charlynne Cousins  [facility]  08/19/2022, 1:08 PM

## 2022-08-19 NOTE — Progress Notes (Signed)
ANTICOAGULATION CONSULT NOTE - Follow Up Consult  Pharmacy Consult for Heparin Indication: atrial fibrillation  No Known Allergies  Patient Measurements: Height: '6\' 1"'$  (185.4 cm) Weight: 69.8 kg (153 lb 14.1 oz) IBW/kg (Calculated) : 79.9 Heparin Dosing Weight: 80 kg  Vital Signs: Temp: 98.7 F (37.1 C) (08/23 0800) Temp Source: Axillary (08/23 0800) BP: 102/47 (08/23 0800) Pulse Rate: 87 (08/23 0800)  Labs: Recent Labs    08/17/22 0308 08/18/22 0328 08/19/22 0448  HGB 10.2*  --  11.2*  HCT 30.4*  --  33.5*  PLT 411*  --  428*  HEPARINUNFRC 0.32 0.32 0.40  CREATININE 0.84 0.87 1.04     Estimated Creatinine Clearance: 56.9 mL/min (by C-G formula based on SCr of 1.04 mg/dL).  Assessment: Douglas Hurst is a 81 y/o male whose pertinent medication history includes Afib (on Eliquis prior to  admission) and CHF. Pharmacy has been consulted for IV heparin dosing, no bolus per MD. PTA Eliquis is on hold. His last dose of Eliquis was on 07/26/22 PTA.  Heparin level remains therapeutic at 0.4 IU/mL.  Hgb stable 11-12s. Platelet count stable.  No bleeding concerns or issues with the line noted.   Goal of Therapy:  Heparin level 0.3-0.7 units/ml Monitor platelets by anticoagulation protocol: Yes   Plan:  Continue IV heparin drip at 1500 units/hr Monitor with daily HL and CBC Follow up for plans to transition back to Eliquis once tolerating PO and no procedures planned  Thank you for involving pharmacy in this patient's care.  Renold Genta, PharmD, BCPS Clinical Pharmacist Clinical phone for 08/19/2022 until 3p is [phone] 08/19/2022 10:29 AM

## 2022-08-19 NOTE — Progress Notes (Signed)
Physical Therapy Treatment Patient Details Name: Douglas Hurst. MRN: [id number] DOB: 03/25/1941 Today's Date: 08/19/2022   History of Present Illness Patient is a 81 yo male presenting to the ED with severe abdominal pain with radiation to right shoulder on 07/31/22. Patient had underwent ECRP for removal of stones from biliary and pancreatic duct on 8/3. Admitted with acute pancreatitis. Patient with cortrak and Perihepatic drain placement. PMH includes: bipolar d/o DMII, HTN, HLD, GERD, COPD ,  Afib ,diastolic heart failure, AAA with interim history of acute cholecystitis.    PT Comments    Pt received in supine, agreeable to therapy session with heavy encouragement after initially refusing OOB mobility. Pt able to perform bed mobility and pivotal transfers to chair at bedside with modA and max cues for safety/sequencing. Pt needing increased assist for eccentric control sitting down to chair due to fatigue. Drop in BP initially upon sitting but BP stabilized after seated exercises performed (thigh high TED hose also donned).  Orthostatic BPs Supine 106/61  Sitting 95/44  Sitting after 3 min 102/57  Standing 103/79  Standing after >2 min Deferred; pt unable   Pt continues to benefit from PT services to progress toward functional mobility goals.   Recommendations for follow up therapy are one component of a multi-disciplinary discharge planning process, led by the attending physician.  Recommendations may be updated based on patient status, additional functional criteria and insurance authorization.  Follow Up Recommendations  Acute inpatient rehab (3hours/day) (pending progress acutely; currently may require lower intensity)     Assistance Recommended at Discharge Frequent or constant Supervision/Assistance  Patient can return home with the following A lot of help with walking and/or transfers;A lot of help with bathing/dressing/bathroom;Assistance with cooking/housework;Direct  supervision/assist for medications management;Direct supervision/assist for financial management;Assist for transportation;Help with stairs or ramp for entrance   Equipment Recommendations  None recommended by PT (pending progress)    Recommendations for Other Services       Precautions / Restrictions Precautions Precautions: Fall Precaution Comments: cortrak, rectal pouch, RUQ drain Restrictions Weight Bearing Restrictions: No     Mobility  Bed Mobility Overal bed mobility: Needs Assistance Bed Mobility: Rolling, Sidelying to Sit Rolling: Min assist Sidelying to sit: Mod assist, HOB elevated       General bed mobility comments: Pt needed mod assist to lift trunk up to midline when sitting up, tactile/verbal cues for UE placement.    Transfers Overall transfer level: Needs assistance Equipment used: Rolling walker (2 wheels) Transfers: Sit to/from Stand Sit to Stand: Mod assist, Max assist   Step pivot transfers: Mod assist       General transfer comment: Mod demonstrational cueing for hand placement with sit to stand and stand to sit, pt needing increased assist for stand>sit transfer due to fatigue and maxA needed to guide hips safely to middle of chair, pt not reaching back for arm rests despite cues; BP stable    Ambulation/Gait Ambulation/Gait assistance: Mod assist   Assistive device: Rolling walker (2 wheels)       Pre-gait activities: pivotal steps ~44f from bed>chair at bedside     Stairs             Wheelchair Mobility    Modified Rankin (Stroke Patients Only)       Balance Overall balance assessment: Needs assistance Sitting-balance support: Feet supported, Bilateral upper extremity supported Sitting balance-Leahy Scale: Poor Sitting balance - Comments: L/posterior lean/LOB needing intermittent min guard for safety   Standing balance support:  During functional activity, Reliant on assistive device for balance Standing balance-Leahy  Scale: Poor Standing balance comment: Mod assist for standing balance with use of the RW and therapist assist.                            Cognition Arousal/Alertness: Awake/alert Behavior During Therapy: Flat affect Overall Cognitive Status: Impaired/Different from baseline Area of Impairment: Problem solving, Awareness, Safety/judgement                   Current Attention Level: Sustained     Safety/Judgement: Decreased awareness of safety Awareness: Intellectual Problem Solving: Requires verbal cues, Decreased initiation, Difficulty sequencing General Comments: Mod cues and increased time to initiate and perform tasks. Pt c/o pain and impulsive to sit with fatigue without reaching back and almost sat on chair arm rest.        Exercises General Exercises - Lower Extremity Long Arc Quad: AROM, Both, 10 reps, Seated Hip Flexion/Marching: AROM, Both, 10 reps, Seated Other Exercises Other Exercises: IS x 10 reps (300-600 mL)    General Comments General comments (skin integrity, edema, etc.): BP 106/61 supine; BP 95/44 seated EOB initially, then BP 102/57 seated EOB after 3 mins; BP 103/79 standing; BP 118/57 after transfer to recliner chair      Pertinent Vitals/Pain Pain Assessment Pain Assessment: Faces Faces Pain Scale: Hurts little more Pain Location: R flank, low back Pain Descriptors / Indicators: Grimacing, Guarding, Discomfort Pain Intervention(s): Monitored during session, Repositioned     PT Goals (current goals can now be found in the care plan section) Acute Rehab PT Goals Patient Stated Goal: to get stronger and pain reduced. PT Goal Formulation: With patient/family Time For Goal Achievement: 08/17/22 (PT notified pt needs goal update) Progress towards PT goals: Progressing toward goals (slow progress)    Frequency    Min 3X/week      PT Plan Current plan remains appropriate       AM-PAC PT "6 Clicks" Mobility   Outcome  Measure  Help needed turning from your back to your side while in a flat bed without using bedrails?: A Little Help needed moving from lying on your back to sitting on the side of a flat bed without using bedrails?: A Lot Help needed moving to and from a bed to a chair (including a wheelchair)?: A Lot Help needed standing up from a chair using your arms (e.g., wheelchair or bedside chair)?: A Lot Help needed to walk in hospital room?: Total Help needed climbing 3-5 steps with a railing? : Total 6 Click Score: 11    End of Session Equipment Utilized During Treatment: Gait belt Activity Tolerance: Patient limited by fatigue;Treatment limited secondary to medical complications (Comment) (symptomatic orthostatic hypotension) Patient left: with call bell/phone within reach;Other (comment);in chair;with chair alarm set (encouraged pt to use IS and to sit up >1 hour if able) Nurse Communication: Mobility status;Other (comment) (pt needs a new condom cath (NT notified RN was on lunch)) PT Visit Diagnosis: Muscle weakness (generalized) (M62.81);Difficulty in walking, not elsewhere classified (R26.2);Other abnormalities of gait and mobility (R26.89);Pain Pain - Right/Left: Right Pain - part of body:  (low back pain)     Time: 8101-7510 PT Time Calculation (min) (ACUTE ONLY): 25 min  Charges:  $Therapeutic Exercise: 8-22 mins $Therapeutic Activity: 8-22 mins                     Sheresa Cullop P., PTA  Acute Rehabilitation Services Secure Chat Preferred 9a-5:30pm Office: [place] 08/19/2022, 5:58 PM

## 2022-08-19 NOTE — Progress Notes (Signed)
[facility] Gastroenterology Progress Note  Douglas Hurst. 81 y.o. [birth year]   Subjective: Feels bad sitting in chair stating he cannot urinate. Wife in room.  Objective: Vital signs: Vitals:   08/19/22 0830 08/19/22 1122  BP:  (!) 126/59  Pulse:  90  Resp:  (!) 24  Temp:  97.8 F (36.6 C)  SpO2: 96% 97%    Physical Exam: Gen: chronically ill-appearing, thin, elderly, no acute distress  HEENT: anicteric sclera CV: RRR Chest: CTA B Abd: diffuse tenderness with guarding, soft, nondistended, +BS, drain bag with bloody fluid Ext: no edema  Lab Results: Recent Labs    08/18/22 0328 08/19/22 0448  NA 132* 132*  K 4.4 4.5  CL 92* 93*  CO2 31 29  GLUCOSE 230* 146*  BUN 24* 39*  CREATININE 0.87 1.04  CALCIUM 9.5 10.1   No results for input(s): "AST", "ALT", "ALKPHOS", "BILITOT", "PROT", "ALBUMIN" in the last 72 hours. Recent Labs    08/17/22 0308 08/19/22 0448  WBC 11.3* 12.1*  HGB 10.2* 11.2*  HCT 30.4* 33.5*  MCV 88.9 88.4  PLT 411* 428*   25 cc drain output overnight   Assessment/Plan: Complicated pancreatitis with pseudocyst formation - repeat CT shows improvement in fluid collections and improving pancreatitis. Encouraged oral intake. Resume tube feeds. Hopefully drain can be removed prior to d/c or shortly after d/c. Supportive care.   Lear Ng 08/19/2022, 6:21 PM  Questions please call [phone] Patient ID: Douglas Mendonca., male   DOB: [birth year], 81 y.o.   MRN: [id number]

## 2022-08-19 NOTE — Progress Notes (Signed)
Patients spouse declined pt to have insulin, (scheduled or coverage) while pt is NPO. Pt  just returned from radiology for CT and is in no distress.

## 2022-08-20 ENCOUNTER — Inpatient Hospital Stay (HOSPITAL_COMMUNITY): Payer: Medicare HMO

## 2022-08-20 DIAGNOSIS — Z515 Encounter for palliative care: Secondary | ICD-10-CM | POA: Diagnosis not present

## 2022-08-20 DIAGNOSIS — K9189 Other postprocedural complications and disorders of digestive system: Secondary | ICD-10-CM | POA: Diagnosis not present

## 2022-08-20 DIAGNOSIS — Z7189 Other specified counseling: Secondary | ICD-10-CM

## 2022-08-20 DIAGNOSIS — K859 Acute pancreatitis without necrosis or infection, unspecified: Secondary | ICD-10-CM | POA: Diagnosis not present

## 2022-08-20 DIAGNOSIS — E43 Unspecified severe protein-calorie malnutrition: Secondary | ICD-10-CM | POA: Diagnosis not present

## 2022-08-20 DIAGNOSIS — K858 Other acute pancreatitis without necrosis or infection: Secondary | ICD-10-CM | POA: Diagnosis not present

## 2022-08-20 HISTORY — PX: IR CATHETER TUBE CHANGE: IMG717

## 2022-08-20 LAB — BASIC METABOLIC PANEL
Anion gap: 7 (ref 5–15)
BUN: 45 mg/dL — ABNORMAL HIGH (ref 8–23)
CO2: 29 mmol/L (ref 22–32)
Calcium: 9.6 mg/dL (ref 8.9–10.3)
Chloride: 93 mmol/L — ABNORMAL LOW (ref 98–111)
Creatinine, Ser: 1.14 mg/dL (ref 0.61–1.24)
GFR, Estimated: >60 mL/min (ref >60)
Glucose, Bld: 249 mg/dL — ABNORMAL HIGH (ref 70–99)
Potassium: 4.3 mmol/L (ref 3.5–5.1)
Sodium: 129 mmol/L — ABNORMAL LOW (ref 135–145)

## 2022-08-20 LAB — CBC WITH DIFFERENTIAL/PLATELET
Abs Immature Granulocytes: 0.6 10*3/uL — ABNORMAL HIGH (ref 0.00–0.07)
Basophils Absolute: 0.1 10*3/uL (ref 0.0–0.1)
Basophils Relative: 1 %
Eosinophils Absolute: 0.2 10*3/uL (ref 0.0–0.5)
Eosinophils Relative: 1 %
HCT: 33.2 % — ABNORMAL LOW (ref 39.0–52.0)
Hemoglobin: 10.9 g/dL — ABNORMAL LOW (ref 13.0–17.0)
Immature Granulocytes: 5 %
Lymphocytes Relative: 8 %
Lymphs Abs: 0.9 10*3/uL (ref 0.7–4.0)
MCH: 29.4 pg (ref 26.0–34.0)
MCHC: 32.8 g/dL (ref 30.0–36.0)
MCV: 89.5 fL (ref 80.0–100.0)
Monocytes Absolute: 1.4 10*3/uL — ABNORMAL HIGH (ref 0.1–1.0)
Monocytes Relative: 12 %
Neutro Abs: 9.2 10*3/uL — ABNORMAL HIGH (ref 1.7–7.7)
Neutrophils Relative %: 73 %
Platelets: 423 10*3/uL — ABNORMAL HIGH (ref 150–400)
RBC: 3.71 MIL/uL — ABNORMAL LOW (ref 4.22–5.81)
RDW: 15.4 % (ref 11.5–15.5)
WBC: 12.4 10*3/uL — ABNORMAL HIGH (ref 4.0–10.5)
nRBC: 0 % (ref 0.0–0.2)

## 2022-08-20 LAB — GLUCOSE, CAPILLARY
Glucose-Capillary: 299 mg/dL — ABNORMAL HIGH (ref 70–99)
Glucose-Capillary: 207 mg/dL — ABNORMAL HIGH (ref 70–99)
Glucose-Capillary: 208 mg/dL — ABNORMAL HIGH (ref 70–99)
Glucose-Capillary: 208 mg/dL — ABNORMAL HIGH (ref 70–99)
Glucose-Capillary: 252 mg/dL — ABNORMAL HIGH (ref 70–99)

## 2022-08-20 LAB — HEPARIN LEVEL (UNFRACTIONATED): Heparin Unfractionated: 0.25 IU/mL — ABNORMAL LOW (ref 0.30–0.70)

## 2022-08-20 MED ORDER — ENSURE ENLIVE PO LIQD
237.0000 mL | Freq: Two times a day (BID) | ORAL | Status: DC
  Administered 2022-08-20 – 2022-09-07 (×30): 237 mL via ORAL
  Filled 2022-08-20 (×3): qty 237

## 2022-08-20 MED ORDER — IOHEXOL 300 MG/ML  SOLN
100.0000 mL | Freq: Once | INTRAMUSCULAR | Status: AC | PRN
  Administered 2022-08-20: 15 mL

## 2022-08-20 MED ORDER — LIDOCAINE HCL 1 % IJ SOLN
INTRAMUSCULAR | Status: AC
  Administered 2022-08-20: 10 mL
  Filled 2022-08-20: qty 20

## 2022-08-20 MED ORDER — SODIUM CHLORIDE 0.9 % IV SOLN: INTRAVENOUS | Status: DC

## 2022-08-20 NOTE — Progress Notes (Signed)
Physical Therapy Treatment Patient Details Name: Douglas Hurst. MRN: [id number] DOB: 10/10/1941 Today's Date: 08/20/2022   History of Present Illness Patient is a 81 yo male presenting to the ED with severe abdominal pain with radiation to right shoulder on 07/31/22. Patient had underwent ECRP for removal of stones from biliary and pancreatic duct on 8/3. Admitted with acute pancreatitis. Patient with cortrak and Perihepatic drain placement. PMH includes: bipolar d/o DMII, HTN, HLD, GERD, COPD ,  Afib ,diastolic heart failure, AAA with interim history of acute cholecystitis.    PT Comments    PT and OT saw pt together given pt's poor activity tolerance and physical and safety assist requirements. PT focus during session included functional mobility and standing tolerance. Pt overall requiring mod +2 for bed mobility and repeated transfers, pt declined gait training or progression away from bed. Pt tolerated 2.5 minutes standing at a time before fatiguing.   Of note, pt expressing he does not want/need PT or OT, that he wants to d/c home if possible. PT updated recommendations to reflect SNF level of care for slower pace rehabilitation, which pt is in agreement with for now. Pt's wife aware of change in recommendation.     Recommendations for follow up therapy are one component of a multi-disciplinary discharge planning process, led by the attending physician.  Recommendations may be updated based on patient status, additional functional criteria and insurance authorization.  Follow Up Recommendations  Skilled nursing-short term rehab (<3 hours/day) (pending progress acutely; currently may require lower intensity) Can patient physically be transported by private vehicle: Yes   Assistance Recommended at Discharge Frequent or constant Supervision/Assistance  Patient can return home with the following A lot of help with walking and/or transfers;A lot of help with  bathing/dressing/bathroom;Assistance with cooking/housework;Direct supervision/assist for medications management;Direct supervision/assist for financial management;Assist for transportation;Help with stairs or ramp for entrance   Equipment Recommendations  None recommended by PT (pending progress)    Recommendations for Other Services       Precautions / Restrictions Precautions Precautions: Fall Precaution Comments: cortrak, rectal pouch, RUQ drain Restrictions Weight Bearing Restrictions: No     Mobility  Bed Mobility Overal bed mobility: Needs Assistance Bed Mobility: Rolling, Sidelying to Sit, Sit to Supine Rolling: Min assist Sidelying to sit: Mod assist, +2 for safety/equipment     Sit to sidelying: Mod assist, +2 for physical assistance, HOB elevated General bed mobility comments: mod assist for trunk translation to reach for opposite rail, mod +2 for supine<>sit for trunk and LE management, very increased time and sequencing assist.    Transfers Overall transfer level: Needs assistance Equipment used: Rolling walker (2 wheels) Transfers: Sit to/from Stand Sit to Stand: Mod assist, +2 safety/equipment           General transfer comment: mod +2 for rise, steadying, RW management when sidestepping, lines/leads. STS x2, from EOB both times.    Ambulation/Gait               General Gait Details: pt declines   Stairs             Wheelchair Mobility    Modified Rankin (Stroke Patients Only)       Balance Overall balance assessment: Needs assistance Sitting-balance support: Feet supported, Bilateral upper extremity supported Sitting balance-Leahy Scale: Fair Sitting balance - Comments: able to sit EOB without PT assist   Standing balance support: During functional activity, Reliant on assistive device for balance Standing balance-Leahy Scale: Poor Standing balance comment:  steadying and RW assist                             Cognition Arousal/Alertness: Awake/alert Behavior During Therapy: Flat affect Overall Cognitive Status: Impaired/Different from baseline Area of Impairment: Problem solving, Awareness, Safety/judgement, Attention, Following commands                   Current Attention Level: Sustained   Following Commands: Follows one step commands with increased time Safety/Judgement: Decreased awareness of safety Awareness: Intellectual Problem Solving: Requires verbal cues, Decreased initiation, Difficulty sequencing General Comments: very flat affect with therapy staff, often appears disgruntled and states "I am down in the dumps when you (physical/occupational therapists) walk in, I don't need therapy"        Exercises General Exercises - Lower Extremity Long Arc Quad: AROM, Both, 5 reps, Seated    General Comments        Pertinent Vitals/Pain Pain Assessment Pain Assessment: Faces Faces Pain Scale: Hurts little more Pain Location: abdomen, when moving sit>supine Pain Descriptors / Indicators: Grimacing, Guarding, Discomfort Pain Intervention(s): Limited activity within patient's tolerance, Monitored during session, Repositioned    Home Living                          Prior Function            PT Goals (current goals can now be found in the care plan section) Acute Rehab PT Goals Patient Stated Goal: to get stronger and pain reduced. PT Goal Formulation: With patient/family Time For Goal Achievement: 08/17/22 (PT notified pt needs goal update) Progress towards PT goals: Not progressing toward goals - comment (pt declines mobility progression)    Frequency    Min 3X/week      PT Plan Current plan remains appropriate    Co-evaluation   Reason for Co-Treatment: For patient/therapist safety;To address functional/ADL transfers PT goals addressed during session: Mobility/safety with mobility        AM-PAC PT "6 Clicks" Mobility   Outcome Measure   Help needed turning from your back to your side while in a flat bed without using bedrails?: A Little Help needed moving from lying on your back to sitting on the side of a flat bed without using bedrails?: A Lot Help needed moving to and from a bed to a chair (including a wheelchair)?: A Lot Help needed standing up from a chair using your arms (e.g., wheelchair or bedside chair)?: A Lot Help needed to walk in hospital room?: Total Help needed climbing 3-5 steps with a railing? : Total 6 Click Score: 11    End of Session Equipment Utilized During Treatment: Gait belt Activity Tolerance: Patient limited by fatigue Patient left: with call bell/phone within reach;Other (comment);in bed;with bed alarm set;with family/visitor present Nurse Communication: Mobility status PT Visit Diagnosis: Muscle weakness (generalized) (M62.81);Difficulty in walking, not elsewhere classified (R26.2);Other abnormalities of gait and mobility (R26.89);Pain Pain - Right/Left: Right Pain - part of body:  (low back pain)     Time: 5361-4431 PT Time Calculation (min) (ACUTE ONLY): 27 min  Charges:  $Therapeutic Activity: 8-22 mins                     Stacie Glaze, PT DPT Acute Rehabilitation Services Pager [phone]  Office [phone]    Diarra Ceja E Stroup 08/20/2022, 11:22 AM

## 2022-08-20 NOTE — Progress Notes (Signed)
[facility] PROGRESS NOTE    Progress Note  Douglas Hurst.  NID:[id number] DOB: 06-Aug-1941 DOA: 07/30/2022 PCP: Lavone Orn, MD     Brief Narrative:   Douglas Hurst. is an 81 y.o. male past medical history of heart failure with recovered EF, paroxysmal atrial fibrillation on amiodarone and apixaban, insulin-dependent diabetes mellitus type 2 COPD and history of a calculus cholecystitis recently discharged on April of this month with a percutaneous cholecystectomy tube, during that time he had A-fib with RVR, he also had a unsuccessful ERCP and subsequent laparoscopic cholecystectomy on 07/19/2022 with an inability to clear the bile duct, referred to [facility] for ERCP which was performed on 07/30/2022 and was discharged home returns to the ED for abdominal pain post ERCP pancreatitis with imaging concerning for necrosis GI was consulted started empirically on antibiotics and IV fluids hospitalization was complicated by A-fib with RVR now converted to sinus rhythm.  Also developed urinary retention which has improved with Flomax.  We were unable to advance his diet on 08/06/2022 CT scan showed a large perihepatic fluid collection IR was consulted and placed a drain on 08/11/2022  Assessment/Plan:   Complicated pancreatitis with pseudocyst formation post ERCP: CT scan 08/09/2022 showed  pseudocyst. Status post aspiration and drain placement of perihepatic fluid collection 08/11/2022, output decline. Status post core track placement Aug 12, 2022 Repeated CT scan showed interval decrease of loculated fluid after percutaneous drain placement. Has mild leukocytosis check a CBC with differential today. Culture data has remained negative to date. PT evaluated the patient, will need inpatient rehab. Relates his pain is not controlled. Last bowel movement was 2 days ago. Further management per GI.  Protein-calorie malnutrition, severe Advance oral intake.  Paroxysmal atrial fibrillation with  RVR: Likely precipitated by acute pancreatitis remains on amiodarone now in sinus rhythm continue metoprolol. Continue IV heparin can plan to transition to oral DOAC once no further interventions are needed and patient can tolerate orals.  Acute respiratory failure with hypoxia: Likely due to pleural effusion.  The try to wean off to room air yesterday but were unsuccessful. Pushing diuresis. Encourage incentive spirometry.  Generalized weakness: Continue to work with physical therapy has been recommended for inpatient rehab.  Acute kidney injury: Resolved with diuresis creatinine has returned to baseline. Hold Lasix for today is currently on free water 150 every 4 hours. Creatinine is rising, started on normal saline recheck basic metabolic panel tomorrow morning.  Hypovolemic hyponatremia: Likely due to diuresis, he is significant negative. Hold Lasix start him on normal saline recheck basic metabolic panel tomorrow morning.  Acute urinary retention/BPH: Continue Flomax.  Constipation: Resolved with MiraLAX.  Hypophosphatemia: Supplemented now resolved.  Acute on chronic diastolic heart failure: With a recovery EF weight is down overall with good urine output. Creatinine is slowly rising, will hold Lasix. Weight has improved since admission.  Well-controlled diabetes mellitus type 2: With an A1c of 5.6 continue NovoLog on sliding scale insulin.  Essential hypertension: Well-controlled continue current regimen.  COPD: Continue inhalers.  Infrarenal abdominal aortic aneurysm: Measuring 3.4 cm follow-up with vascular as an outpatient.  Hypokalemia: Replete orally now resolved.    DVT prophylaxis: heparin Family Communication:none Status is: Inpatient Remains inpatient appropriate because: Post ERCP pancreatitis    Code Status:     Code Status Orders  (From admission, onward)           Start     Ordered   07/31/22 0233  Full code  Continuous  07/31/22 0235           Code Status History     Date Active Date Inactive Code Status Order ID Comments User Context   07/08/2022 1532 07/14/2022 1956 Full Code [id number]  Felicie Morn, MD Inpatient   04/25/2022 1512 04/30/2022 2147 Full Code [id number]  Orma Flaming, MD ED         IV Access:   Peripheral IV   Procedures and diagnostic studies:   CT ABDOMEN PELVIS W CONTRAST  Result Date: 08/19/2022 CLINICAL DATA:  Pancreatitis, follow-up of pseudocyst EXAM: CT ABDOMEN AND PELVIS WITH CONTRAST TECHNIQUE: Multidetector CT imaging of the abdomen and pelvis was performed using the standard protocol following bolus administration of intravenous contrast. RADIATION DOSE REDUCTION: This exam was performed according to the departmental dose-optimization program which includes automated exposure control, adjustment of the mA and/or kV according to patient size and/or use of iterative reconstruction technique. CONTRAST:  169m OMNIPAQUE IOHEXOL 300 MG/ML  SOLN COMPARISON:  Previous studies including the examination of 08/09/2022 FINDINGS: Lower chest: There is interval decrease in bilateral pleural effusions. Small residual bilateral effusions are seen, more so on the right side. There is interval improvement in the aeration of lower lung fields with residual atelectasis/pneumonia in both lower lung fields, more so on the right side. Coronary artery calcifications are seen. Hepatobiliary: There is interval placement of percutaneous drain into the loculated fluid collection along the lateral aspect of the liver. There is partial clearing of loculated perihepatic fluid collection. In the current study, maximum transverse diameter of the collection measures 3 cm. No focal abnormalities are seen in liver parenchyma. There is no dilation of bile ducts. Surgical clips are seen in gallbladder fossa. Pancreas: There is 3.7 x 1.5 cm loculated fluid collection along the anterior aspect of head of the  pancreas with interval decrease in size. Head of the pancreas is larger in comparison to the body and tail with adjacent stranding suggesting resolving pancreatitis. There are no new loculated fluid collections in or around the pancreas. Spleen: Unremarkable. Adrenals/Urinary Tract: Hyperplasia of left adrenal and possible subcentimeter low-density in the right adrenal appear stable. There is no hydronephrosis. There is 1.9 cm cyst in lateral upper pole of right kidney with no change. There are scattered arterial calcifications. No renal or ureteral stones are seen. Urinary bladder is not distended. There is mild diffuse wall thickening in the bladder. Stomach/Bowel: Tip of enteric tube is seen in the distal antrum of stomach. Small bowel loops are not dilated. The appendix is not seen. There is no wall thickening in colon. Scattered diverticula are seen in colon without signs of focal acute diverticulitis. 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 There is ectasia of left common iliac artery measuring 1.6 cm. Reproductive: Prostate is enlarged projecting into the base of the bladder. Other: There is no pneumoperitoneum or significant ascites. Umbilical hernia containing fat is seen. Musculoskeletal: Degenerative changes are noted in lumbar spine. IMPRESSION: There is interval decrease in loculated perihepatic fluid collection after placement of percutaneous drain. There is 3.7 x 1.5 cm loculated fluid collection along the anterior margin of head of the pancreas with interval decrease in size suggesting resolving pseudocyst. There are no new loculated fluid collections in or around the pancreas. There is no evidence of intestinal obstruction or pneumoperitoneum. There is no hydronephrosis. There is interval decrease in bilateral pleural effusions with small residual effusions on both  sides, more so on the right side. There is interval  decrease in patchy infiltrates in both lower lung fields suggesting resolving atelectasis/pneumonia. 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 Follow-up imaging every 2 years may be considered. Coronary artery disease. Enlarged prostate. Other findings as described in the body of the report. Electronically Signed   By: Elmer Picker M.D.   On: 08/19/2022 13:41     Medical Consultants:   None.   Subjective:    Douglas Hurst. Relates he is having pain with feedings.  Objective:    Vitals:   08/20/22 0305 08/20/22 0500 08/20/22 0800 08/20/22 0849  BP: (!) 121/59  (!) 127/58 (!) 118/58  Pulse: 82  77 79  Resp: '15  17 19  '$ Temp: 98.1 F (36.7 C)     TempSrc: Temporal     SpO2: 92%  95% 91%  Weight:  77.2 kg    Height:       SpO2: 91 % O2 Flow Rate (L/min): 0.5 L/min   Intake/Output Summary (Last 24 hours) at 08/20/2022 1026 Last data filed at 08/20/2022 0700 Gross per 24 hour  Intake 360 ml  Output 1330 ml  Net -970 ml    Filed Weights   08/17/22 0439 08/19/22 0500 08/20/22 0500  Weight: 79.6 kg 69.8 kg 77.2 kg    Exam: General exam: In no acute distress, nasogastric tube in place. Respiratory system: Good air movement and clear to auscultation. Cardiovascular system: S1 & S2 heard, RRR. No JVD. Gastrointestinal system: Abdomen is nondistended, soft and nontender drain in place. Extremities: No pedal edema. Skin: No rashes, lesions or ulcers Psychiatry: Judgement and insight appear normal. Mood & affect appropriate. Data Reviewed:    Labs: Basic Metabolic Panel: Recent Labs  Lab 08/13/22 1609 08/14/22 0421 08/14/22 0421 08/14/22 1753 08/15/22 8295 08/15/22 1629 08/16/22 6213 08/17/22 0308 08/18/22 0328 08/19/22 0448 08/20/22 0354  NA  --  136   < >  --  135  --  129* 133* 132* 132* 129*  K  --  3.7   < >  --  3.8  --   4.0 4.3 4.4 4.5 4.3  CL  --  99   < >  --  97*  --  93* 94* 92* 93* 93*  CO2  --  28   < >  --  30  --  27 34* '31 29 29  '$ GLUCOSE  --  204*   < >  --  194*  --  265* 205* 230* 146* 249*  BUN  --  15   < >  --  15  --  20 20 24* 39* 45*  CREATININE  --  0.79   < >  --  0.72  --  0.80 0.84 0.87 1.04 1.14  CALCIUM  --  8.7*   < >  --  8.7*  --  8.5* 9.0 9.5 10.1 9.6  MG 2.0 1.9  --  2.0 1.8 1.8  --   --   --   --   --   PHOS 2.6 2.2*  --  2.9 2.6 2.5  --   --   --   --   --    < > = values in this interval not displayed.    GFR Estimated Creatinine Clearance: 57.4 mL/min (by C-G  formula based on SCr of 1.14 mg/dL). Liver Function Tests: No results for input(s): "AST", "ALT", "ALKPHOS", "BILITOT", "PROT", "ALBUMIN" in the last 168 hours. No results for input(s): "LIPASE", "AMYLASE" in the last 168 hours. No results for input(s): "AMMONIA" in the last 168 hours. Coagulation profile No results for input(s): "INR", "PROTIME" in the last 168 hours. COVID-19 Labs  No results for input(s): "DDIMER", "FERRITIN", "LDH", "CRP" in the last 72 hours.  No results found for: "SARSCOV2NAA"  CBC: Recent Labs  Lab 08/14/22 0421 08/15/22 0431 08/16/22 0739 08/17/22 0308 08/19/22 0448  WBC 9.0 9.1 10.4 11.3* 12.1*  HGB 11.0* 10.5* 10.0* 10.2* 11.2*  HCT 33.2* 32.2* 30.6* 30.4* 33.5*  MCV 90.2 90.7 90.3 88.9 88.4  PLT 333 352 360 411* 428*    Cardiac Enzymes: No results for input(s): "CKTOTAL", "CKMB", "CKMBINDEX", "TROPONINI" in the last 168 hours. BNP (last 3 results) No results for input(s): "PROBNP" in the last 8760 hours. CBG: Recent Labs  Lab 08/19/22 1723 08/19/22 1930 08/19/22 2322 08/20/22 0300 08/20/22 0913  GLUCAP 196* 247* 237* 299* 207*    D-Dimer: No results for input(s): "DDIMER" in the last 72 hours. Hgb A1c: No results for input(s): "HGBA1C" in the last 72 hours. Lipid Profile: No results for input(s): "CHOL", "HDL", "LDLCALC", "TRIG", "CHOLHDL", "LDLDIRECT" in  the last 72 hours. Thyroid function studies: No results for input(s): "TSH", "T4TOTAL", "T3FREE", "THYROIDAB" in the last 72 hours.  Invalid input(s): "FREET3" Anemia work up: No results for input(s): "VITAMINB12", "FOLATE", "FERRITIN", "TIBC", "IRON", "RETICCTPCT" in the last 72 hours. Sepsis Labs: Recent Labs  Lab 08/15/22 0431 08/16/22 0739 08/17/22 0308 08/19/22 0448  WBC 9.1 10.4 11.3* 12.1*    Microbiology Recent Results (from the past 240 hour(s))  Aerobic/Anaerobic Culture w Gram Stain (surgical/deep wound)     Status: None   Collection Time: 08/11/22  4:42 PM   Specimen: Abscess  Result Value Ref Range Status   Specimen Description ABSCESS  Final   Special Requests LIVER  Final   Gram Stain NO ORGANISMS SEEN NO WBC SEEN   Final   Culture   Final    No growth aerobically or anaerobically. Performed at [facility], [place] [address] [place], [place] [address]    Report Status 08/16/2022 FINAL  Final     Medications:    amiodarone  200 mg Oral Daily   feeding supplement (OSMOLITE 1.5 CAL)  1,000 mL Per Tube Q24H   feeding supplement (PROSource TF20)  60 mL Per Tube TID   fentaNYL  1 patch Transdermal Q72H   free water  150 mL Per Tube Q4H   insulin aspart  0-9 Units Subcutaneous Q4H   insulin aspart  3 Units Subcutaneous Q4H   metoprolol tartrate  25 mg Oral BID   polyethylene glycol  17 g Oral Daily   senna-docusate  1 tablet Oral Daily   tamsulosin  0.8 mg Oral Daily   Continuous Infusions:  heparin 1,500 Units/hr (08/20/22 0301)      LOS: 20 days   Charlynne Cousins  [facility]  08/20/2022, 10:26 AM

## 2022-08-20 NOTE — Procedures (Signed)
Interventional Radiology Procedure Note  Procedure: Image guided drain exchange, new  16F pigtail drain, perihepatic biloma  Complications: None  Recommendations: - Routine drain care, with sterile flushes, record output -  - routine wound care  Signed,  Dulcy Fanny. Earleen Newport, DO

## 2022-08-20 NOTE — Progress Notes (Signed)
Referring Physician(s): Aileen Fass   Supervising Physician: Sandi Mariscal  Patient Status:  [facility] - In-pt  Chief Complaint: Right hepatic abscess s/p drain placement 8/15  Subjective: Reports he is feeling better.  NGT remain in place.  RUQ drain in place. Insertion site intact.   CT Abdomen Pelvis obtained 8/23.   Allergies: Patient has no known allergies.  Medications: Prior to Admission medications   Medication Sig Start Date End Date Taking? Authorizing Provider  acetaminophen (TYLENOL) 325 MG tablet Take 2 tablets (650 mg total) by mouth every 6 (six) hours as needed for mild pain (or Fever >/= 101). 04/30/22  Yes Sheikh, Omair Latif, DO  amiodarone (PACERONE) 200 MG tablet Take 1 tablet (200 mg total) by mouth daily. 05/29/22  Yes Loel Dubonnet, NP  atorvastatin (LIPITOR) 40 MG tablet Take 40 mg by mouth every evening.   Yes [provider]  lisinopril-hydrochlorothiazide (ZESTORETIC) 10-12.5 MG tablet Take 0.5 tablets by mouth daily.   Yes [provider]  metFORMIN (GLUCOPHAGE-XR) 500 MG 24 hr tablet Take 500-1,000 mg by mouth See admin instructions. Take 2 tablets (1000 mg) by mouth in the morning and 1 tablet (500 mg) every evening 03/17/22  Yes [provider]  metoprolol tartrate (LOPRESSOR) 25 MG tablet Take 1 tablet (25 mg total) by mouth 2 (two) times daily. 05/29/22  Yes Loel Dubonnet, NP  Multiple Vitamin (MULTIVITAMIN WITH MINERALS) TABS tablet Take 1 tablet by mouth daily.   Yes [provider]  ondansetron (ZOFRAN) 4 MG tablet Take 1 tablet (4 mg total) by mouth every 6 (six) hours as needed for nausea. 04/30/22  Yes Sheikh, Omair Latif, DO  pantoprazole (PROTONIX) 40 MG tablet Take 40 mg by mouth every morning.   Yes [provider]  polyethylene glycol powder (GLYCOLAX/MIRALAX) 17 GM/SCOOP powder Take 17 g by mouth daily. Patient taking differently: Take 17 g by mouth daily as needed for mild constipation or  moderate constipation. 04/30/22  Yes Sheikh, Omair Latif, DO  senna-docusate (SENOKOT-S) 8.6-50 MG tablet Take 1 tablet by mouth at bedtime. Patient taking differently: Take 1 tablet by mouth at bedtime as needed for mild constipation or moderate constipation. 04/30/22  Yes Sheikh, Omair Latif, DO  SPIRIVA RESPIMAT 2.5 MCG/ACT AERS Inhale 2 puffs into the lungs daily as needed (COPD). 06/04/22  Yes [provider]  tadalafil (CIALIS) 20 MG tablet Take 20 mg by mouth every three (3) days as needed for erectile dysfunction. 03/29/22  Yes [provider]  tamsulosin (FLOMAX) 0.4 MG CAPS capsule Take 0.4 mg by mouth daily after supper. 05/13/22  Yes [provider]  apixaban (ELIQUIS) 5 MG TABS tablet Take 1 tablet (5 mg total) by mouth 2 (two) times daily. Patient not taking: Reported on 07/31/2022 05/29/22   Loel Dubonnet, NP  oxyCODONE-acetaminophen (PERCOCET) 5-325 MG tablet Take 1 tablet by mouth every 4 (four) hours as needed for severe pain. Patient not taking: Reported on 07/31/2022 07/14/22 07/14/23  Norm Parcel, PA-C     Vital Signs: BP 119/64   Pulse 67   Temp 98.1 F (36.7 C) (Temporal)   Resp 18   Ht '6\' 1"'$  (1.854 m)   Wt 170 lb 3.1 oz (77.2 kg)   SpO2 94%   BMI 22.45 kg/m   Physical Exam Vitals reviewed.  Skin:    General: Skin is warm.     Comments: Insertion site clean and dry.  Site non-tender. 30 mL output overnight.  Flushes easily, no aspiration.      Imaging: CT ABDOMEN PELVIS W CONTRAST  Result Date: 08/19/2022 CLINICAL DATA:  Pancreatitis, follow-up of pseudocyst EXAM: CT ABDOMEN AND PELVIS WITH CONTRAST TECHNIQUE: Multidetector CT imaging of the abdomen and pelvis was performed using the standard protocol following bolus administration of intravenous contrast. RADIATION DOSE REDUCTION: This exam was performed according to the departmental dose-optimization program which includes automated exposure control, adjustment of the mA and/or kV  according to patient size and/or use of iterative reconstruction technique. CONTRAST:  143m OMNIPAQUE IOHEXOL 300 MG/ML  SOLN COMPARISON:  Previous studies including the examination of 08/09/2022 FINDINGS: Lower chest: There is interval decrease in bilateral pleural effusions. Small residual bilateral effusions are seen, more so on the right side. There is interval improvement in the aeration of lower lung fields with residual atelectasis/pneumonia in both lower lung fields, more so on the right side. Coronary artery calcifications are seen. Hepatobiliary: There is interval placement of percutaneous drain into the loculated fluid collection along the lateral aspect of the liver. There is partial clearing of loculated perihepatic fluid collection. In the current study, maximum transverse diameter of the collection measures 3 cm. No focal abnormalities are seen in liver parenchyma. There is no dilation of bile ducts. Surgical clips are seen in gallbladder fossa. Pancreas: There is 3.7 x 1.5 cm loculated fluid collection along the anterior aspect of head of the pancreas with interval decrease in size. Head of the pancreas is larger in comparison to the body and tail with adjacent stranding suggesting resolving pancreatitis. There are no new loculated fluid collections in or around the pancreas. Spleen: Unremarkable. Adrenals/Urinary Tract: Hyperplasia of left adrenal and possible subcentimeter low-density in the right adrenal appear stable. There is no hydronephrosis. There is 1.9 cm cyst in lateral upper pole of right kidney with no change. There are scattered arterial calcifications. No renal or ureteral stones are seen. Urinary bladder is not distended. There is mild diffuse wall thickening in the bladder. Stomach/Bowel: Tip of enteric tube is seen in the distal antrum of stomach. Small bowel loops are not dilated. The appendix is not seen. There is no wall thickening in colon. Scattered diverticula are seen in  colon without signs of focal acute diverticulitis. 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 There is ectasia of left common iliac artery measuring 1.6 cm. Reproductive: Prostate is enlarged projecting into the base of the bladder. Other: There is no pneumoperitoneum or significant ascites. Umbilical hernia containing fat is seen. Musculoskeletal: Degenerative changes are noted in lumbar spine. IMPRESSION: There is interval decrease in loculated perihepatic fluid collection after placement of percutaneous drain. There is 3.7 x 1.5 cm loculated fluid collection along the anterior margin of head of the pancreas with interval decrease in size suggesting resolving pseudocyst. There are no new loculated fluid collections in or around the pancreas. There is no evidence of intestinal obstruction or pneumoperitoneum. There is no hydronephrosis. There is interval decrease in bilateral pleural effusions with small residual effusions on both sides, more so on the right side. There is interval decrease in patchy infiltrates in both lower lung fields suggesting resolving atelectasis/pneumonia. 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 Follow-up imaging every 2 years may be considered. Coronary artery disease. Enlarged prostate. Other findings as  described in the body of the report. Electronically Signed   By: Elmer Picker M.D.   On: 08/19/2022 13:41    Labs:  CBC: Recent Labs    08/16/22 0739 08/17/22 0308 08/19/22 0448 08/20/22 1126  WBC 10.4 11.3* 12.1* 12.4*  HGB 10.0* 10.2* 11.2* 10.9*  HCT 30.6* 30.4* 33.5* 33.2*  PLT 360 411* 428* 423*     COAGS: Recent Labs    04/26/22 0843 08/01/22 1832 08/02/22 0703 08/11/22 0941  INR 1.3*  --   --  1.1  APTT  --  121* 73*  --      BMP: Recent  Labs    08/17/22 0308 08/18/22 0328 08/19/22 0448 08/20/22 0354  NA 133* 132* 132* 129*  K 4.3 4.4 4.5 4.3  CL 94* 92* 93* 93*  CO2 34* '31 29 29  '$ GLUCOSE 205* 230* 146* 249*  BUN 20 24* 39* 45*  CALCIUM 9.0 9.5 10.1 9.6  CREATININE 0.84 0.87 1.04 1.14  GFRNONAA >60 >60 >60 >60     LIVER FUNCTION TESTS: Recent Labs    08/01/22 0717 08/02/22 0703 08/03/22 0819 08/10/22 0747  BILITOT 1.1 1.0 1.0 0.7  AST '22 19 18 17  '$ ALT '23 20 17 16  '$ ALKPHOS 53 72 64 73  PROT 5.2* 5.5* 5.0* 5.4*  ALBUMIN 2.4* 2.3* 2.0* 1.8*     Drain Location: perihepatic drain Size: Fr size: 10 Fr Date of placement: 08/11/22  Currently to: Drain collection device: gravity 24 hour output:  Output by Drain (mL) 08/18/22 0701 - 08/18/22 1900 08/18/22 1901 - 08/19/22 0700 08/19/22 0701 - 08/19/22 1900 08/19/22 1901 - 08/20/22 0700 08/20/22 0701 - 08/20/22 1445  Closed System Drain 1 Right;Anterior;Lateral RUQ Other (Comment) 10 Fr. 25 0  30     Interval imaging/drain manipulation:  CT today  Current examination: Intact.  Flushes, but no aspiration.  Output stable 25-35 mL/day   Plan: Continue TID flushes with 5 cc NS. Record output Q shift. Dressing changes QD or PRN if soiled.  Call IR APP or on call IR MD if difficulty flushing or sudden change in drain output.   Discharge planning: Please contact IR APP or on call IR MD prior to patient d/c to ensure appropriate follow up plans are in place. Typically patient will follow up with IR clinic 10-14 days post d/c for repeat imaging/possible drain injection. IR scheduler will contact patient with date/time of appointment. Patient will need to flush drain QD with 5 cc NS, record output QD, dressing changes every 2-3 days or earlier if soiled.   Assessment and Plan:  IR will continue to follow - please call with questions or concerns. Will discuss CT findings with IR Rad    Electronically Signed: Docia Barrier, PA 08/20/2022, 2:45  PM   I spent a total of 15 Minutes at the the patient's bedside AND on the patient's hospital floor or unit, greater than 50% of which was counseling/coordinating care for perihepatic abscess

## 2022-08-20 NOTE — Progress Notes (Signed)
ANTICOAGULATION CONSULT NOTE - Follow Up Consult  Pharmacy Consult for Heparin Indication: atrial fibrillation  No Known Allergies  Patient Measurements: Height: '6\' 1"'$  (185.4 cm) Weight: 77.2 kg (170 lb 3.1 oz) IBW/kg (Calculated) : 79.9 Heparin Dosing Weight: 80 kg  Vital Signs: Temp: 98.1 F (36.7 C) (08/24 0305) Temp Source: Temporal (08/24 0305) BP: 118/58 (08/24 0849) Pulse Rate: 79 (08/24 0849)  Labs: Recent Labs    08/18/22 0328 08/19/22 0448 08/20/22 0354  HGB  --  11.2*  --   HCT  --  33.5*  --   PLT  --  428*  --   HEPARINUNFRC 0.32 0.40 0.25*  CREATININE 0.87 1.04 1.14     Estimated Creatinine Clearance: 57.4 mL/min (by C-G formula based on SCr of 1.14 mg/dL).  Assessment: Douglas Hurst is a 81 y/o male whose past medical history includes Afib (on Eliquis prior to  admission) and CHF. Pharmacy has been consulted for IV heparin dosing, no bolus per MD. PTA Eliquis held on admission for drain placement. His last dose of Eliquis was on 07/26/22 PTA.  Heparin level subtherapeutic on 1500 units/hr.  Hgb stable 11-12s. Platelet count stable.  No bleeding concerns or issues with the line noted.   Goal of Therapy:  Heparin level 0.3-0.7 units/ml Monitor platelets by anticoagulation protocol: Yes   Plan:  Increase IV heparin to 1600 units/hr Monitor with daily heparin level and CBC Follow up for plans to transition back to Eliquis once tolerating PO and no procedures planned  Thank you for involving pharmacy in this patient's care.  Horton Chin, PharmD, BCPS Clinical Pharmacist Clinical phone for 08/20/2022 until 3p is [phone] 08/20/2022 10:00 AM

## 2022-08-20 NOTE — Progress Notes (Signed)
Occupational Therapy Treatment Patient Details Name: Douglas Hurst. MRN: [id number] DOB: July 13, 1941 Today's Date: 08/20/2022   History of present illness Patient is a 81 yo male presenting to the ED with severe abdominal pain with radiation to right shoulder on 07/31/22. Patient had underwent ECRP for removal of stones from biliary and pancreatic duct on 8/3. Admitted with acute pancreatitis. Patient with cortrak and Perihepatic drain placement. PMH includes: bipolar d/o DMII, HTN, HLD, GERD, COPD ,  Afib ,diastolic heart failure, AAA with interim history of acute cholecystitis.   OT comments  Pt reported in session they were tired of therapy and discussed with pt about the difference between AIR and SNF. Pt at this time is agreeable to SNF level of therapy and wife was informed within the session as came into room. Pt at this time was highly encouraged for ADLs while sitting at EOB but declined at this time. Pt did agree to complete standing while having some hygiene occurred at this time. Pt also decline to remain OOB at this time. Pt currently with functional limitations due to the deficits listed below (see OT Problem List).  Pt will benefit from skilled OT to increase their safety and independence with ADL and functional mobility for ADL to facilitate discharge to venue listed below.     Recommendations for follow up therapy are one component of a multi-disciplinary discharge planning process, led by the attending physician.  Recommendations may be updated based on patient status, additional functional criteria and insurance authorization.    Follow Up Recommendations  Skilled nursing-short term rehab (<3 hours/day)    Assistance Recommended at Discharge Frequent or constant Supervision/Assistance  Patient can return home with the following  Assist for transportation;Help with stairs or ramp for entrance;Direct supervision/assist for medications management;A lot of help with walking and/or  transfers;A lot of help with bathing/dressing/bathroom;Assistance with cooking/housework   Equipment Recommendations   (TBD at next level of care)    Recommendations for Other Services      Precautions / Restrictions Precautions Precautions: Fall Precaution Comments: cortrak, rectal pouch, RUQ drain Restrictions Weight Bearing Restrictions: No       Mobility Bed Mobility Overal bed mobility: Needs Assistance Bed Mobility: Rolling, Sidelying to Sit, Sit to Supine Rolling: Min assist Sidelying to sit: Mod assist, +2 for safety/equipment Supine to sit: Mod assist, HOB elevated Sit to supine: Max assist Sit to sidelying: Mod assist, +2 for physical assistance, HOB elevated General bed mobility comments: mod assist for trunk translation to reach for opposite rail, mod +2 for supine<>sit for trunk and LE management, very increased time and sequencing assist.    Transfers Overall transfer level: Needs assistance Equipment used: Rolling walker (2 wheels) Transfers: Sit to/from Stand Sit to Stand: Mod assist, +2 safety/equipment           General transfer comment: mod +2 for rise, steadying, RW management when sidestepping, lines/leads. STS x2, from EOB both times.     Balance Overall balance assessment: Needs assistance Sitting-balance support: Feet supported, Bilateral upper extremity supported Sitting balance-Leahy Scale: Fair Sitting balance - Comments: able to sit EOB without PT assist Postural control: Left lateral lean Standing balance support: During functional activity, Reliant on assistive device for balance Standing balance-Leahy Scale: Poor Standing balance comment: steadying and RW assist                           ADL either performed or assessed with clinical judgement  ADL Overall ADL's : Needs assistance/impaired Eating/Feeding: Minimal assistance   Grooming: Wash/dry hands;Set up   Upper Body Bathing: Minimal assistance;Sitting   Lower  Body Bathing: Moderate assistance;Sit to/from stand   Upper Body Dressing : Moderate assistance;Sitting   Lower Body Dressing: Moderate assistance               Functional mobility during ADLs: Min guard;Rolling walker (2 wheels) General ADL Comments: side stepping to [facility]    Extremity/Trunk Assessment Upper Extremity Assessment Upper Extremity Assessment: Overall WFL for tasks assessed RUE Deficits / Details: WFL ROM and 5/5 strength per chart but pt decline much activity at this time RUE Sensation: WNL RUE Coordination: WNL LUE Deficits / Details: WFl ROM and 5/5 strength per chart but limited activity to assess LUE Sensation: WNL LUE Coordination: WNL   Lower Extremity Assessment Lower Extremity Assessment: Defer to PT evaluation        Vision       Perception     Praxis      Cognition Arousal/Alertness: Awake/alert Behavior During Therapy: Flat affect Overall Cognitive Status: Impaired/Different from baseline Area of Impairment: Problem solving, Awareness, Safety/judgement, Attention, Following commands                   Current Attention Level: Sustained   Following Commands: Follows one step commands with increased time Safety/Judgement: Decreased awareness of safety Awareness: Intellectual Problem Solving: Requires verbal cues, Decreased initiation, Difficulty sequencing General Comments: very flat affect with therapy staff, often appears disgruntled and states "I am down in the dumps when you (physical/occupational therapists) walk in, I don't need therapy"        Exercises      Shoulder Instructions       General Comments      Pertinent Vitals/ Pain       Pain Assessment Pain Assessment: Faces Pain Score: 0-No pain Faces Pain Scale: Hurts little more Breathing: normal Negative Vocalization: none Facial Expression: smiling or inexpressive Body Language: relaxed Consolability: no need to console PAINAD Score: 0 Pain Location:  abdomen, when moving sit>supine Pain Descriptors / Indicators: Grimacing, Guarding, Discomfort Pain Intervention(s): Limited activity within patient's tolerance, Monitored during session, Repositioned  Home Living                                          Prior Functioning/Environment              Frequency  Min 2X/week        Progress Toward Goals  OT Goals(current goals can now be found in the care plan section)  Progress towards OT goals: Progressing toward goals  Acute Rehab OT Goals Patient Stated Goal: pt reproted to rest OT Goal Formulation: With patient Time For Goal Achievement: 09/01/22 Potential to Achieve Goals: Good ADL Goals Pt Will Perform Grooming: with min guard assist;standing Pt Will Perform Lower Body Bathing: with min assist;sit to/from stand Pt Will Perform Lower Body Dressing: with min assist;sit to/from stand Pt Will Transfer to Toilet: with min guard assist;ambulating;bedside commode Pt Will Perform Toileting - Clothing Manipulation and hygiene: with modified independence;sit to/from stand Pt/caregiver will Perform Home Exercise Program: Both right and left upper extremity;Increased strength;With theraband;With written HEP provided Additional ADL Goal #1: Patient will demonstrate increased activity tolerance to complete functional task in standing for 3-5 minutes without need for seated rest break.  Plan Discharge plan  needs to be updated    Co-evaluation    PT/OT/SLP Co-Evaluation/Treatment: Yes Reason for Co-Treatment: Complexity of the patient's impairments (multi-system involvement) PT goals addressed during session: Mobility/safety with mobility OT goals addressed during session: ADL's and self-care      AM-PAC OT "6 Clicks" Daily Activity     Outcome Measure   Help from another person eating meals?: None Help from another person taking care of personal grooming?: A Little Help from another person toileting, which  includes using toliet, bedpan, or urinal?: A Lot Help from another person bathing (including washing, rinsing, drying)?: A Lot Help from another person to put on and taking off regular upper body clothing?: A Little Help from another person to put on and taking off regular lower body clothing?: A Lot 6 Click Score: 16    End of Session Equipment Utilized During Treatment: Gait belt;Rolling walker (2 wheels)  OT Visit Diagnosis: Unsteadiness on feet (R26.81);Muscle weakness (generalized) (M62.81);Other symptoms and signs involving cognitive function;Pain Pain - Right/Left:  (abdomen)   Activity Tolerance Patient limited by fatigue   Patient Left in bed;with call bell/phone within reach;with bed alarm set   Nurse Communication Mobility status        Time: 9166-0600 OT Time Calculation (min): 25 min  Charges: OT General Charges $OT Visit: 1 Visit OT Treatments $Self Care/Home Management : 8-22 mins  Joeseph Amor OTR/L  Acute Rehab Services  [phone] office number [phone] pager number   Joeseph Amor 08/20/2022, 1:23 PM

## 2022-08-20 NOTE — Consult Note (Signed)
Consultation Note Date: 08/20/2022   Patient Name: Douglas Hurst.  DOB: [birth year]  MRN: [id number]  Age / Sex: 81 y.o., male  PCP: Lavone Orn, MD Referring Physician: Aileen Fass, Tammi Klippel, MD  Reason for Consultation: Establishing goals of care  HPI/Patient Profile: 81 y.o. male  with past medical history of heart failure with recovered EF, paroxysmal atrial fibrillation on amiodarone and apixaban, insulin-dependent diabetes mellitus type 2, COPD, and history of a calculus cholecystitis admitted on 07/30/2022 with abdominal pain post ERCP at [place].  Patient diagnosed with complicated pancreatitis with pseudocyst formation.  He also required drain placement of.  Hepatic fluid collection during hospitalization.  Patient also required feeding tube placement August 16 due to poor intake.  Patient with generalized weakness and reluctant to work with therapy -SNF rehab has been recommended.  PMT consulted to discuss goals of care.  Clinical Assessment and Goals of Care: I have reviewed medical records including EPIC notes, labs and imaging, received report from RN, assessed the patient and then met with patient to discuss diagnosis prognosis, GOC, EOL wishes, disposition and options.  I introduced Palliative Medicine as specialized medical care for people living with serious illness. It focuses on providing relief from the symptoms and stress of a serious illness. The goal is to improve quality of life for both the patient and the family.  Patient can correctly answer orientation questions however throughout our conversation it was clear he had periods of confusion.  For instance at 1 point he told me he had a son and then later told me he did not have a son but had a daughter.  I did attempt to discuss goals of care with patient and we reviewed his hospitalization and complications.  He tells me his only goal is to get his feeding tube out.  We  discussed the need for better p.o. intake first and he expresses understanding.  We discussed the recommendation for SNF rehab following hospitalization and he seems to be agreeable to this.  We briefly reviewed his CODE STATUS -I detailed for him DNR versus full code and he tells me he does not know what he wants.  I did tell him that the default is full code and he is currently listed as full code and he tells me he is okay with this.  I asked him if I can speak to his wife further and he agrees.  Called to patient's wife Douglas Hurst.  Brief life review completed.  She tells me she and the patient been married 35 years.  She tells me that patient had 2 kids -his son passed away and his daughter lives in a group home.  We discussed the role of palliative medicine in the patient's care and the need for further goals of care discussions.  She also agrees that the patient is having some periods of confusion and not able to independently discuss goals of care.  She is agreeable to this but asks that we meet in person tomorrow morning.  We scheduled a meeting for 10 AM tomorrow.  Primary Decision Maker NEXT OF KIN -wife Douglas Hurst    SUMMARY OF RECOMMENDATIONS   -Patient unable to discuss goals of care independently due to periods of confusion -Goals of care discussion scheduled with wife at 72 AM 8/25  Code Status/Advance Care Planning: Full code  Discharge Planning: [facility] for rehab with Palliative care service follow-up      Primary Diagnoses: Present on Admission:  Acute pancreatitis  I have reviewed the medical record, interviewed the patient and family, and examined the patient. The following aspects are pertinent.  Past Medical History:  Diagnosis Date   Bipolar disorder (HCC)    Chronic back pain    Complication of anesthesia    prolonged sedation and confusion   COPD (chronic obstructive pulmonary disease) (HCC)    Dr. Laurann Montana   Diabetes mellitus without  complication ([address])    type 2   Diverticulosis    Dysrhythmia    Afib  onset 03-2022   ED (erectile dysfunction)    Gallstones    GERD (gastroesophageal reflux disease)    Hiatal hernia    Hyperlipidemia    Hypertension    Wears dentures    upper   Wears glasses    Wears hearing aid in both ears    Social History   Socioeconomic History   Marital status: Married    Spouse name: Not on file   Number of children: Not on file   Years of education: Not on file   Highest education level: Not on file  Occupational History   Not on file  Tobacco Use   Smoking status: Former    Years: 35.00    Types: Cigarettes   Smokeless tobacco: Never  Vaping Use   Vaping Use: Never used  Substance and Sexual Activity   Alcohol use: No   Drug use: No   Sexual activity: Not Currently  Other Topics Concern   Not on file  Social History Narrative   Not on file   Social Determinants of Health   Financial Resource Strain: Not on file  Food Insecurity: Not on file  Transportation Needs: Not on file  Physical Activity: Not on file  Stress: Not on file  Social Connections: Not on file   Family History  Problem Relation Age of Onset   Heart attack Mother    Cancer - Colon Mother    CAD Mother    Cancer Mother    Dementia Mother    CAD Father    Cancer Father    CVA Father    Dementia Father    Heart attack Brother    CAD Brother    Scheduled Meds:  amiodarone  200 mg Oral Daily   feeding supplement  237 mL Oral BID BM   feeding supplement (OSMOLITE 1.5 CAL)  1,000 mL Per Tube Q24H   feeding supplement (PROSource TF20)  60 mL Per Tube TID   fentaNYL  1 patch Transdermal Q72H   free water  150 mL Per Tube Q4H   insulin aspart  0-9 Units Subcutaneous Q4H   insulin aspart  3 Units Subcutaneous Q4H   metoprolol tartrate  25 mg Oral BID   polyethylene glycol  17 g Oral Daily   senna-docusate  1 tablet Oral Daily   tamsulosin  0.8 mg Oral Daily   Continuous Infusions:  sodium  chloride 75 mL/hr at 08/20/22 1309   heparin 1,600 Units/hr (08/20/22 1038)   PRN Meds:.acetaminophen **OR** acetaminophen, HYDROcodone-acetaminophen, HYDROmorphone (DILAUDID) injection, ondansetron **OR** ondansetron (ZOFRAN) IV, umeclidinium bromide No Known Allergies Review of Systems  Constitutional:  Positive for activity change, appetite change and fatigue.  Neurological:  Positive for weakness.    Physical Exam Constitutional:      General: He is not in acute distress.    Appearance: He is ill-appearing.  Pulmonary:     Effort: Pulmonary effort is normal.  Skin:    General: Skin is warm  and dry.  Neurological:     Mental Status: He is alert.     Comments: Periods of confusion     Vital Signs: BP 119/64   Pulse 67   Temp 98.1 F (36.7 C) (Temporal)   Resp 18   Ht 6' 1"  (1.854 m)   Wt 77.2 kg   SpO2 94%   BMI 22.45 kg/m  Pain Scale: 0-10 POSS *See Group Information*: 1-Acceptable,Awake and alert Pain Score: 0-No pain   SpO2: SpO2: 94 % O2 Device:SpO2: 94 % O2 Flow Rate: .O2 Flow Rate (L/min): 0.5 L/min  IO: Intake/output summary:  Intake/Output Summary (Last 24 hours) at 08/20/2022 1459 Last data filed at 08/20/2022 1353 Gross per 24 hour  Intake 720 ml  Output 1430 ml  Net -710 ml    LBM: Last BM Date : 08/19/22 Baseline Weight: Weight: 80.7 kg Most recent weight: Weight: 77.2 kg     Palliative Assessment/Data: PPS 40%     *Please note that this is a verbal dictation therefore any spelling or grammatical errors are due to the "[employer]" system interpretation.  Juel Burrow, DNP, AGNP-C Palliative Medicine Team [phone] Pager: [phone]

## 2022-08-21 DIAGNOSIS — K9189 Other postprocedural complications and disorders of digestive system: Secondary | ICD-10-CM | POA: Diagnosis not present

## 2022-08-21 DIAGNOSIS — Z515 Encounter for palliative care: Secondary | ICD-10-CM | POA: Diagnosis not present

## 2022-08-21 DIAGNOSIS — K859 Acute pancreatitis without necrosis or infection, unspecified: Secondary | ICD-10-CM | POA: Diagnosis not present

## 2022-08-21 DIAGNOSIS — K858 Other acute pancreatitis without necrosis or infection: Secondary | ICD-10-CM | POA: Diagnosis not present

## 2022-08-21 DIAGNOSIS — E43 Unspecified severe protein-calorie malnutrition: Secondary | ICD-10-CM | POA: Diagnosis not present

## 2022-08-21 DIAGNOSIS — Z7189 Other specified counseling: Secondary | ICD-10-CM | POA: Diagnosis not present

## 2022-08-21 LAB — BASIC METABOLIC PANEL
Anion gap: 4 — ABNORMAL LOW (ref 5–15)
BUN: 36 mg/dL — ABNORMAL HIGH (ref 8–23)
CO2: 28 mmol/L (ref 22–32)
Calcium: 9.2 mg/dL (ref 8.9–10.3)
Chloride: 99 mmol/L (ref 98–111)
Creatinine, Ser: 1.02 mg/dL (ref 0.61–1.24)
GFR, Estimated: >60 mL/min (ref >60)
Glucose, Bld: 233 mg/dL — ABNORMAL HIGH (ref 70–99)
Potassium: 4.6 mmol/L (ref 3.5–5.1)
Sodium: 131 mmol/L — ABNORMAL LOW (ref 135–145)

## 2022-08-21 LAB — HEPARIN LEVEL (UNFRACTIONATED): Heparin Unfractionated: 0.53 IU/mL (ref 0.30–0.70)

## 2022-08-21 LAB — CBC
HCT: 31.3 % — ABNORMAL LOW (ref 39.0–52.0)
Hemoglobin: 10.2 g/dL — ABNORMAL LOW (ref 13.0–17.0)
MCH: 29.6 pg (ref 26.0–34.0)
MCHC: 32.6 g/dL (ref 30.0–36.0)
MCV: 90.7 fL (ref 80.0–100.0)
Platelets: 339 10*3/uL (ref 150–400)
RBC: 3.45 MIL/uL — ABNORMAL LOW (ref 4.22–5.81)
RDW: 15.6 % — ABNORMAL HIGH (ref 11.5–15.5)
WBC: 10.8 10*3/uL — ABNORMAL HIGH (ref 4.0–10.5)
nRBC: 0 % (ref 0.0–0.2)

## 2022-08-21 LAB — GLUCOSE, CAPILLARY
Glucose-Capillary: 128 mg/dL — ABNORMAL HIGH (ref 70–99)
Glucose-Capillary: 141 mg/dL — ABNORMAL HIGH (ref 70–99)
Glucose-Capillary: 146 mg/dL — ABNORMAL HIGH (ref 70–99)
Glucose-Capillary: 177 mg/dL — ABNORMAL HIGH (ref 70–99)
Glucose-Capillary: 212 mg/dL — ABNORMAL HIGH (ref 70–99)
Glucose-Capillary: 266 mg/dL — ABNORMAL HIGH (ref 70–99)

## 2022-08-21 MED ORDER — ACETAMINOPHEN 500 MG PO TABS
1000.0000 mg | ORAL_TABLET | Freq: Three times a day (TID) | ORAL | Status: DC
  Administered 2022-08-21 – 2022-09-08 (×52): 1000 mg via ORAL
  Filled 2022-08-21 (×54): qty 2

## 2022-08-21 MED ORDER — SODIUM CHLORIDE 0.9 % IV SOLN: INTRAVENOUS | Status: AC

## 2022-08-21 NOTE — Progress Notes (Signed)
Referring Physician(s): Aileen Fass   Supervising Physician: Corrie Mckusick  Patient Status:  [facility] - In-pt  Chief Complaint: Right hepatic abscess s/p drain placement 8/15 S/p exchange and upsize to 12 Fr on 8/24   Subjective: Pt laying in bed, NAD.  Reports persistent right sided abd pain, no N/V fever, chills.   Allergies: Patient has no known allergies.  Medications: Prior to Admission medications   Medication Sig Start Date End Date Taking? Authorizing Provider  acetaminophen (TYLENOL) 325 MG tablet Take 2 tablets (650 mg total) by mouth every 6 (six) hours as needed for mild pain (or Fever >/= 101). 04/30/22  Yes Sheikh, Omair Latif, DO  amiodarone (PACERONE) 200 MG tablet Take 1 tablet (200 mg total) by mouth daily. 05/29/22  Yes Loel Dubonnet, NP  atorvastatin (LIPITOR) 40 MG tablet Take 40 mg by mouth every evening.   Yes [provider]  lisinopril-hydrochlorothiazide (ZESTORETIC) 10-12.5 MG tablet Take 0.5 tablets by mouth daily.   Yes [provider]  metFORMIN (GLUCOPHAGE-XR) 500 MG 24 hr tablet Take 500-1,000 mg by mouth See admin instructions. Take 2 tablets (1000 mg) by mouth in the morning and 1 tablet (500 mg) every evening 03/17/22  Yes [provider]  metoprolol tartrate (LOPRESSOR) 25 MG tablet Take 1 tablet (25 mg total) by mouth 2 (two) times daily. 05/29/22  Yes Loel Dubonnet, NP  Multiple Vitamin (MULTIVITAMIN WITH MINERALS) TABS tablet Take 1 tablet by mouth daily.   Yes [provider]  ondansetron (ZOFRAN) 4 MG tablet Take 1 tablet (4 mg total) by mouth every 6 (six) hours as needed for nausea. 04/30/22  Yes Sheikh, Omair Latif, DO  pantoprazole (PROTONIX) 40 MG tablet Take 40 mg by mouth every morning.   Yes [provider]  polyethylene glycol powder (GLYCOLAX/MIRALAX) 17 GM/SCOOP powder Take 17 g by mouth daily. Patient taking differently: Take 17 g by mouth daily as needed for mild constipation or moderate  constipation. 04/30/22  Yes Sheikh, Omair Latif, DO  senna-docusate (SENOKOT-S) 8.6-50 MG tablet Take 1 tablet by mouth at bedtime. Patient taking differently: Take 1 tablet by mouth at bedtime as needed for mild constipation or moderate constipation. 04/30/22  Yes Sheikh, Omair Latif, DO  SPIRIVA RESPIMAT 2.5 MCG/ACT AERS Inhale 2 puffs into the lungs daily as needed (COPD). 06/04/22  Yes [provider]  tadalafil (CIALIS) 20 MG tablet Take 20 mg by mouth every three (3) days as needed for erectile dysfunction. 03/29/22  Yes [provider]  tamsulosin (FLOMAX) 0.4 MG CAPS capsule Take 0.4 mg by mouth daily after supper. 05/13/22  Yes [provider]  apixaban (ELIQUIS) 5 MG TABS tablet Take 1 tablet (5 mg total) by mouth 2 (two) times daily. Patient not taking: Reported on 07/31/2022 05/29/22   Loel Dubonnet, NP  oxyCODONE-acetaminophen (PERCOCET) 5-325 MG tablet Take 1 tablet by mouth every 4 (four) hours as needed for severe pain. Patient not taking: Reported on 07/31/2022 07/14/22 07/14/23  Norm Parcel, PA-C     Vital Signs: BP (!) 140/68 (BP Location: Left Arm)   Pulse 82   Temp 97.9 F (36.6 C) (Oral)   Resp (!) 23   Ht '6\' 1"'$  (1.854 m)   Wt 173 lb 8 oz (78.7 kg)   SpO2 95%   BMI 22.89 kg/m   Physical Exam Vitals reviewed.  Constitutional:      General: He is not in acute distress. HENT:     Head:  Normocephalic.  Pulmonary:     Effort: Pulmonary effort is normal.  Abdominal:     Palpations: Abdomen is soft.  Skin:    General: Skin is warm.     Comments: Positive RUQ drain to a suction bulb. Site is unremarkable with no erythema, edema, tenderness, bleeding or drainage. Suture and stat lock in place. Dressing is clean, dry, and intact. 10 ml of  dark red colored fluid noted in the bulb. Drain aspirates and flushes well.    Neurological:     Mental Status: He is alert.  Psychiatric:        Mood and Affect: Mood normal.        Behavior: Behavior  normal.     Imaging: IR Catheter Tube Change  Result Date: 08/20/2022 INDICATION: 81 year old male with perihepatic drain and residual subcapsular/perihepatic fluid. He presents for exchange/upsize EXAM: IMAGE GUIDED DRAIN EXCHANGE/UPSIZE MEDICATIONS: None ANESTHESIA/SEDATION: None COMPLICATIONS: None PROCEDURE: Informed written consent was obtained from the patient after a thorough discussion of the procedural risks, benefits and alternatives. All questions were addressed. Maximal Sterile Barrier Technique was utilized including caps, mask, sterile gowns, sterile gloves, sterile drape, hand hygiene and skin antiseptic. A timeout was performed prior to the initiation of the procedure. Patient positioned supine under the image intensifier. The right abdomen in the indwelling drain were prepped and draped in the usual sterile fashion. 1% lidocaine was used for local anesthesia. Contrast was injected, confirming adequate location within the perihepatic fluid collection. 035 wire was advanced through the catheter and the catheter was removed. Kumpe the catheter was used in attempt to manipulate the drain into a more favorable location. Combination of an Amplatz wire and a stiff Glidewire were used. A new 12 French drain was positioned in a more cephalad location, where there was communicating fluid and the pigtail catheter was locked. Contrast was injected confirming adequate location. The drain was attached to bulb suction. Patient tolerated the procedure well and remained hemodynamically stable throughout. No complications were encountered and no significant blood loss. FINDINGS: Injection of the drain in minute elation of the drain are suggestive that there is some significant loculations/compartmentalization to the right perihepatic fluid collection, which is not evident on prior CT. New 12 French drain in the perihepatic fluid collection. IMPRESSION: Status post exchange and up size of perihepatic biloma  drain. Signed, Dulcy Fanny. Nadene Rubins, RPVI [facility] Radiology Electronically Signed   By: Corrie Mckusick D.O.   On: 08/20/2022 17:08   CT ABDOMEN PELVIS W CONTRAST  Result Date: 08/19/2022 CLINICAL DATA:  Pancreatitis, follow-up of pseudocyst EXAM: CT ABDOMEN AND PELVIS WITH CONTRAST TECHNIQUE: Multidetector CT imaging of the abdomen and pelvis was performed using the standard protocol following bolus administration of intravenous contrast. RADIATION DOSE REDUCTION: This exam was performed according to the departmental dose-optimization program which includes automated exposure control, adjustment of the mA and/or kV according to patient size and/or use of iterative reconstruction technique. CONTRAST:  19m OMNIPAQUE IOHEXOL 300 MG/ML  SOLN COMPARISON:  Previous studies including the examination of 08/09/2022 FINDINGS: Lower chest: There is interval decrease in bilateral pleural effusions. Small residual bilateral effusions are seen, more so on the right side. There is interval improvement in the aeration of lower lung fields with residual atelectasis/pneumonia in both lower lung fields, more so on the right side. Coronary artery calcifications are seen. Hepatobiliary: There is interval placement of percutaneous drain into the loculated fluid collection along the lateral aspect of the  liver. There is partial clearing of loculated perihepatic fluid collection. In the current study, maximum transverse diameter of the collection measures 3 cm. No focal abnormalities are seen in liver parenchyma. There is no dilation of bile ducts. Surgical clips are seen in gallbladder fossa. Pancreas: There is 3.7 x 1.5 cm loculated fluid collection along the anterior aspect of head of the pancreas with interval decrease in size. Head of the pancreas is larger in comparison to the body and tail with adjacent stranding suggesting resolving pancreatitis. There are no new  loculated fluid collections in or around the pancreas. Spleen: Unremarkable. Adrenals/Urinary Tract: Hyperplasia of left adrenal and possible subcentimeter low-density in the right adrenal appear stable. There is no hydronephrosis. There is 1.9 cm cyst in lateral upper pole of right kidney with no change. There are scattered arterial calcifications. No renal or ureteral stones are seen. Urinary bladder is not distended. There is mild diffuse wall thickening in the bladder. Stomach/Bowel: Tip of enteric tube is seen in the distal antrum of stomach. Small bowel loops are not dilated. The appendix is not seen. There is no wall thickening in colon. Scattered diverticula are seen in colon without signs of focal acute diverticulitis. 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 There is ectasia of left common iliac artery measuring 1.6 cm. Reproductive: Prostate is enlarged projecting into the base of the bladder. Other: There is no pneumoperitoneum or significant ascites. Umbilical hernia containing fat is seen. Musculoskeletal: Degenerative changes are noted in lumbar spine. IMPRESSION: There is interval decrease in loculated perihepatic fluid collection after placement of percutaneous drain. There is 3.7 x 1.5 cm loculated fluid collection along the anterior margin of head of the pancreas with interval decrease in size suggesting resolving pseudocyst. There are no new loculated fluid collections in or around the pancreas. There is no evidence of intestinal obstruction or pneumoperitoneum. There is no hydronephrosis. There is interval decrease in bilateral pleural effusions with small residual effusions on both sides, more so on the right side. There is interval decrease in patchy infiltrates in both lower lung fields suggesting resolving atelectasis/pneumonia. 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 Follow-up imaging every 2 years may be considered. Coronary artery disease. Enlarged prostate. Other findings as described in the body of the report. Electronically Signed   By: Elmer Picker M.D.   On: 08/19/2022 13:41    Labs:  CBC: Recent Labs    08/17/22 0308 08/19/22 0448 08/20/22 1126 08/21/22 0334  WBC 11.3* 12.1* 12.4* 10.8*  HGB 10.2* 11.2* 10.9* 10.2*  HCT 30.4* 33.5* 33.2* 31.3*  PLT 411* 428* 423* 339     COAGS: Recent Labs    04/26/22 0843 08/01/22 1832 08/02/22 0703 08/11/22 0941  INR 1.3*  --   --  1.1  APTT  --  121* 73*  --      BMP: Recent Labs    08/18/22 0328 08/19/22 0448 08/20/22 0354 08/21/22 0334  NA 132* 132* 129* 131*  K 4.4 4.5 4.3 4.6  CL 92* 93* 93* 99  CO2 '31 29 29 28  '$ GLUCOSE 230* 146* 249* 233*  BUN 24* 39* 45* 36*  CALCIUM 9.5 10.1 9.6 9.2  CREATININE 0.87 1.04 1.14 1.02  GFRNONAA >60 >60 >60 >60  LIVER FUNCTION TESTS: Recent Labs    08/01/22 0717 08/02/22 0703 08/03/22 0819 08/10/22 0747  BILITOT 1.1 1.0 1.0 0.7  AST '22 19 18 17  '$ ALT '23 20 17 16  '$ ALKPHOS 53 72 64 73  PROT 5.2* 5.5* 5.0* 5.4*  ALBUMIN 2.4* 2.3* 2.0* 1.8*      Assessment and Plan:  81 yo male with recent ERCP presented with hepatic subcapsular fluid, s/p drain placement on 8/15. OP trended down despite persistent fluid collection seen on CT, drain was upsized to 12 Fr on 8/24.   OP improved, 70 mL overnight  WBC trending down, 10.8 (12.4) VSS, tachypenic at times  Cx no growth   Drain Location: perihepatic drain Size: 12 Fr Date of placement: 08/11/22 - upsize 8/24  Currently to: Drain collection device: gravity 24 hour output:  Output by Drain (mL) 08/19/22 0701 - 08/19/22 1900 08/19/22 1901 - 08/20/22 0700 08/20/22 0701 - 08/20/22 1900 08/20/22 1901 - 08/21/22 0700 08/21/22 0701 - 08/21/22 0943  Closed System Drain 1 Lateral;Right Abdomen 12  Fr.    70     Interval imaging/drain manipulation:  8/23: CT AP with: There is interval decrease in loculated perihepatic fluid collection after placement of percutaneous drain 8/24: drain upsized to 12 Fr due to low OP   Current examination: F/a well.  Site c/d/I.  OP thick, dark red colored   Plan: Continue TID flushes with 5 cc NS. Record output Q shift. Dressing changes QD or PRN if soiled.  Call IR APP or on call IR MD if difficulty flushing or sudden change in drain output.   Discharge planning: Please contact IR APP or on call IR MD prior to patient d/c to ensure appropriate follow up plans are in place. Typically patient will follow up with IR clinic 10-14 days post d/c for repeat imaging/possible drain injection. IR scheduler will contact patient with date/time of appointment. Patient will need to flush drain QD with 5 cc NS, record output QD, dressing changes every 2-3 days or earlier if soiled.    Electronically Signed: Tera Mater, PA-C 08/21/2022, 9:43 AM   I spent a total of 15 Minutes at the the patient's bedside AND on the patient's hospital floor or unit, greater than 50% of which was counseling/coordinating care for perihepatic abscess

## 2022-08-21 NOTE — Plan of Care (Signed)
  Problem: Clinical Measurements: Goal: Ability to maintain clinical measurements within normal limits will improve Outcome: Progressing Goal: Will remain free from infection Outcome: Progressing Goal: Cardiovascular complication will be avoided Outcome: Progressing   Problem: Activity: Goal: Risk for activity intolerance will decrease Outcome: Progressing   Problem: Pain Managment: Goal: General experience of comfort will improve Outcome: Progressing   Problem: Safety: Goal: Ability to remain free from injury will improve Outcome: Progressing

## 2022-08-21 NOTE — Progress Notes (Signed)
ANTICOAGULATION CONSULT NOTE - Follow Up Consult  Pharmacy Consult for Heparin Indication: atrial fibrillation  No Known Allergies  Patient Measurements: Height: '6\' 1"'$  (185.4 cm) Weight: 78.7 kg (173 lb 8 oz) IBW/kg (Calculated) : 79.9 Heparin Dosing Weight: 80 kg  Vital Signs: Temp: 97.9 F (36.6 C) (08/25 0751) Temp Source: Oral (08/25 0751) BP: 140/68 (08/25 0751) Pulse Rate: 82 (08/25 0751)  Labs: Recent Labs    08/19/22 0448 08/20/22 0354 08/20/22 1126 08/21/22 0334  HGB 11.2*  --  10.9* 10.2*  HCT 33.5*  --  33.2* 31.3*  PLT 428*  --  423* 339  HEPARINUNFRC 0.40 0.25*  --  0.53  CREATININE 1.04 1.14  --  1.02     Estimated Creatinine Clearance: 65.4 mL/min (by C-G formula based on SCr of 1.02 mg/dL).  Assessment: Douglas Hurst is a 81 y/o male whose past medical history includes Afib (on Eliquis prior to  admission) and CHF. Pharmacy has been consulted for IV heparin dosing, no bolus per MD. PTA Eliquis held on admission for drain placement. His last dose of Eliquis was on 07/26/22 PTA.  Heparin level therapeutic on 1600 units / hr  Goal of Therapy:  Heparin level 0.3-0.7 units/ml Monitor platelets by anticoagulation protocol: Yes   Plan:  Continue heparin at 1600 units / hr Monitor with daily heparin level and CBC Follow up for plans to transition back to Eliquis once tolerating PO and no procedures planned  Thank you Anette Guarneri, PharmD 08/21/2022 8:46 AM

## 2022-08-21 NOTE — Progress Notes (Signed)
[facility] PROGRESS NOTE    Progress Note  Douglas Friedlander.  VHQ:[id number] DOB: [birth year] DOA: 07/30/2022 PCP: Lavone Orn, MD     Brief Narrative:   Douglas Caraher. is an 81 y.o. male past medical history of heart failure with recovered EF, paroxysmal atrial fibrillation on amiodarone and apixaban, insulin-dependent diabetes mellitus type 2 COPD and history of a calculus cholecystitis recently discharged on April of this month with a percutaneous cholecystectomy tube, during that time he had A-fib with RVR, he also had a unsuccessful ERCP and subsequent laparoscopic cholecystectomy on 07/19/2022 with an inability to clear the bile duct, referred to [facility] for ERCP which was performed on 07/30/2022 and was discharged home returns to the ED for abdominal pain post ERCP pancreatitis with imaging concerning for necrosis GI was consulted started empirically on antibiotics and IV fluids hospitalization was complicated by A-fib with RVR now converted to sinus rhythm.  Also developed urinary retention which has improved with Flomax.  We were unable to advance his diet on 08/06/2022 CT scan showed a large perihepatic fluid collection IR was consulted and placed a drain on 08/11/2022  Assessment/Plan:   Complicated pancreatitis with pseudocyst formation post ERCP: Status post percutaneous drain on 08/11/2022. Has not had a bowel movement.  Further management per GI. He relates eating by mouth causing abdominal pain. Tube feedings do not cause abdominal pain. Continue tube feedings nocturnally. He relates he is tolerating Ensure.  Protein-calorie malnutrition, severe Advance oral intake.  Paroxysmal atrial fibrillation with RVR: Likely precipitated by acute pancreatitis remains on amiodarone now in sinus rhythm continue metoprolol. Continue IV heparin can plan to transition to oral DOAC once no further interventions are needed and patient can tolerate orals.  Acute respiratory failure with  hypoxia: Likely due to pleural effusion.   Successfully weaned to room air.  Out of bed to chair. Encourage incentive spirometry.  Generalized weakness: Continue to work with physical therapy has been recommended for inpatient rehab.  Acute kidney injury: Resolved with diuresis creatinine has returned to baseline. Continue to hold Lasix continue normal saline. Creatinine is improving normal saline.  Hypovolemic hyponatremia: Likely due to diuresis, he is significant negative. Continue to hold Lasix sodium is improving.  Acute urinary retention/BPH: Continue Flomax.  Constipation: Continue MiraLAX and he has not had a bowel movement in 2 days.  Hypophosphatemia: Supplemented now resolved.  Acute on chronic diastolic heart failure: With a recovery EF weight is down overall with good urine output. Creatinine is stabilized continue to hold Lasix. Weight has improved since admission.  Well-controlled diabetes mellitus type 2: With an A1c of 5.6 continue NovoLog on sliding scale insulin.  Essential hypertension: Well-controlled continue current regimen.  COPD: Continue inhalers.  Infrarenal abdominal aortic aneurysm: Measuring 3.4 cm follow-up with vascular as an outpatient.  Hypokalemia: Replete orally now resolved.    DVT prophylaxis: heparin Family Communication:none Status is: Inpatient Remains inpatient appropriate because: Post ERCP pancreatitis    Code Status:     Code Status Orders  (From admission, onward)           Start     Ordered   07/31/22 0233  Full code  Continuous        07/31/22 0235           Code Status History     Date Active Date Inactive Code Status Order ID Comments User Context   07/08/2022 1532 07/14/2022 1956 Full Code [id number]  Stechschulte, Nickola Major, MD Inpatient  04/25/2022 1512 04/30/2022 2147 Full Code [id number]  Orma Flaming, MD ED         IV Access:   Peripheral IV   Procedures and diagnostic studies:    IR Catheter Tube Change  Result Date: 08/20/2022 INDICATION: 81 year old male with perihepatic drain and residual subcapsular/perihepatic fluid. He presents for exchange/upsize EXAM: IMAGE GUIDED DRAIN EXCHANGE/UPSIZE MEDICATIONS: None ANESTHESIA/SEDATION: None COMPLICATIONS: None PROCEDURE: Informed written consent was obtained from the patient after a thorough discussion of the procedural risks, benefits and alternatives. All questions were addressed. Maximal Sterile Barrier Technique was utilized including caps, mask, sterile gowns, sterile gloves, sterile drape, hand hygiene and skin antiseptic. A timeout was performed prior to the initiation of the procedure. Patient positioned supine under the image intensifier. The right abdomen in the indwelling drain were prepped and draped in the usual sterile fashion. 1% lidocaine was used for local anesthesia. Contrast was injected, confirming adequate location within the perihepatic fluid collection. 035 wire was advanced through the catheter and the catheter was removed. Kumpe the catheter was used in attempt to manipulate the drain into a more favorable location. Combination of an Amplatz wire and a stiff Glidewire were used. A new 12 French drain was positioned in a more cephalad location, where there was communicating fluid and the pigtail catheter was locked. Contrast was injected confirming adequate location. The drain was attached to bulb suction. Patient tolerated the procedure well and remained hemodynamically stable throughout. No complications were encountered and no significant blood loss. FINDINGS: Injection of the drain in minute elation of the drain are suggestive that there is some significant loculations/compartmentalization to the right perihepatic fluid collection, which is not evident on prior CT. New 12 French drain in the perihepatic fluid collection. IMPRESSION: Status post exchange and up size of perihepatic biloma drain. Signed, Dulcy Fanny.  Nadene Rubins, RPVI [facility] Electronically Signed   By: Corrie Mckusick D.O.   On: 08/20/2022 17:08   CT ABDOMEN PELVIS W CONTRAST  Result Date: 08/19/2022 CLINICAL DATA:  Pancreatitis, follow-up of pseudocyst EXAM: CT ABDOMEN AND PELVIS WITH CONTRAST TECHNIQUE: Multidetector CT imaging of the abdomen and pelvis was performed using the standard protocol following bolus administration of intravenous contrast. RADIATION DOSE REDUCTION: This exam was performed according to the departmental dose-optimization program which includes automated exposure control, adjustment of the mA and/or kV according to patient size and/or use of iterative reconstruction technique. CONTRAST:  164m OMNIPAQUE IOHEXOL 300 MG/ML  SOLN COMPARISON:  Previous studies including the examination of 08/09/2022 FINDINGS: Lower chest: There is interval decrease in bilateral pleural effusions. Small residual bilateral effusions are seen, more so on the right side. There is interval improvement in the aeration of lower lung fields with residual atelectasis/pneumonia in both lower lung fields, more so on the right side. Coronary artery calcifications are seen. Hepatobiliary: There is interval placement of percutaneous drain into the loculated fluid collection along the lateral aspect of the liver. There is partial clearing of loculated perihepatic fluid collection. In the current study, maximum transverse diameter of the collection measures 3 cm. No focal abnormalities are seen in liver parenchyma. There is no dilation of bile ducts. Surgical clips are seen in gallbladder fossa. Pancreas: There is 3.7 x 1.5 cm loculated fluid collection along the anterior aspect of head of the pancreas with interval decrease in size. Head of the pancreas is larger in comparison to the body and tail with adjacent stranding suggesting resolving pancreatitis. There are no  new loculated fluid collections in  or around the pancreas. Spleen: Unremarkable. Adrenals/Urinary Tract: Hyperplasia of left adrenal and possible subcentimeter low-density in the right adrenal appear stable. There is no hydronephrosis. There is 1.9 cm cyst in lateral upper pole of right kidney with no change. There are scattered arterial calcifications. No renal or ureteral stones are seen. Urinary bladder is not distended. There is mild diffuse wall thickening in the bladder. Stomach/Bowel: Tip of enteric tube is seen in the distal antrum of stomach. Small bowel loops are not dilated. The appendix is not seen. There is no wall thickening in colon. Scattered diverticula are seen in colon without signs of focal acute diverticulitis. 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 There is ectasia of left common iliac artery measuring 1.6 cm. Reproductive: Prostate is enlarged projecting into the base of the bladder. Other: There is no pneumoperitoneum or significant ascites. Umbilical hernia containing fat is seen. Musculoskeletal: Degenerative changes are noted in lumbar spine. IMPRESSION: There is interval decrease in loculated perihepatic fluid collection after placement of percutaneous drain. There is 3.7 x 1.5 cm loculated fluid collection along the anterior margin of head of the pancreas with interval decrease in size suggesting resolving pseudocyst. There are no new loculated fluid collections in or around the pancreas. There is no evidence of intestinal obstruction or pneumoperitoneum. There is no hydronephrosis. There is interval decrease in bilateral pleural effusions with small residual effusions on both sides, more so on the right side. There is interval decrease in patchy infiltrates in both lower lung fields suggesting resolving atelectasis/pneumonia. 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 Follow-up imaging every 2 years may be considered. Coronary artery disease. Enlarged prostate. Other findings as described in the body of the report. Electronically Signed   By: Elmer Picker M.D.   On: 08/19/2022 13:41     Medical Consultants:   None.   Subjective:    Douglas Friedlander. relates he is tolerating his feeding but when he eats it bothers him.  Objective:    Vitals:   08/21/22 0000 08/21/22 0400 08/21/22 0500 08/21/22 0751  BP: (!) 117/56 (!) 121/50  (!) 140/68  Pulse: 70 72  82  Resp: 18 13  (!) 23  Temp: 97.7 F (36.5 C) 97.9 F (36.6 C)  97.9 F (36.6 C)  TempSrc: Oral Oral  Oral  SpO2: 95% 95%  95%  Weight:   78.7 kg   Height:       SpO2: 95 % O2 Flow Rate (L/min): 0.5 L/min   Intake/Output Summary (Last 24 hours) at 08/21/2022 1013 Last data filed at 08/21/2022 0600 Gross per 24 hour  Intake 1752.94 ml  Output 2766 ml  Net -1013.06 ml    Filed Weights   08/19/22 0500 08/20/22 0500 08/21/22 0500  Weight: 69.8 kg 77.2 kg 78.7 kg    Exam: General exam: In no acute distress. Respiratory system: Good air movement and clear to auscultation. Cardiovascular system: S1 & S2 heard, RRR. No JVD. Gastrointestinal system: Abdomen is nondistended, soft and nontender.  Extremities: No pedal edema. Skin: No rashes, lesions or ulcers Psychiatry: Judgement and insight appear normal. Mood & affect appropriate. Data Reviewed:    Labs: Basic Metabolic Panel: Recent Labs  Lab 08/14/22 1753 08/15/22 0431  08/15/22 1629 08/16/22 0739 08/17/22 0308 08/18/22 0328 08/19/22 0448 08/20/22 0354 08/21/22 0334  NA  --  135  --    < > 133* 132* 132* 129* 131*  K  --  3.8  --    < > 4.3 4.4 4.5 4.3 4.6  CL  --  97*  --    < > 94* 92* 93* 93* 99  CO2  --  30  --    < > 34* '31 29 29 28  '$ GLUCOSE  --  194*  --    < > 205* 230* 146* 249* 233*  BUN  --  15  --    < > 20 24* 39* 45* 36*  CREATININE  --   0.72  --    < > 0.84 0.87 1.04 1.14 1.02  CALCIUM  --  8.7*  --    < > 9.0 9.5 10.1 9.6 9.2  MG 2.0 1.8 1.8  --   --   --   --   --   --   PHOS 2.9 2.6 2.5  --   --   --   --   --   --    < > = values in this interval not displayed.    GFR Estimated Creatinine Clearance: 65.4 mL/min (by C-G formula based on SCr of 1.02 mg/dL). Liver Function Tests: No results for input(s): "AST", "ALT", "ALKPHOS", "BILITOT", "PROT", "ALBUMIN" in the last 168 hours. No results for input(s): "LIPASE", "AMYLASE" in the last 168 hours. No results for input(s): "AMMONIA" in the last 168 hours. Coagulation profile No results for input(s): "INR", "PROTIME" in the last 168 hours. COVID-19 Labs  No results for input(s): "DDIMER", "FERRITIN", "LDH", "CRP" in the last 72 hours.  No results found for: "SARSCOV2NAA"  CBC: Recent Labs  Lab 08/16/22 0739 08/17/22 0308 08/19/22 0448 08/20/22 1126 08/21/22 0334  WBC 10.4 11.3* 12.1* 12.4* 10.8*  NEUTROABS  --   --   --  9.2*  --   HGB 10.0* 10.2* 11.2* 10.9* 10.2*  HCT 30.6* 30.4* 33.5* 33.2* 31.3*  MCV 90.3 88.9 88.4 89.5 90.7  PLT 360 411* 428* 423* 339    Cardiac Enzymes: No results for input(s): "CKTOTAL", "CKMB", "CKMBINDEX", "TROPONINI" in the last 168 hours. BNP (last 3 results) No results for input(s): "PROBNP" in the last 8760 hours. CBG: Recent Labs  Lab 08/20/22 1227 08/20/22 2008 08/20/22 2315 08/21/22 0500 08/21/22 0756  GLUCAP 208* 208* 252* 266* 141*    D-Dimer: No results for input(s): "DDIMER" in the last 72 hours. Hgb A1c: No results for input(s): "HGBA1C" in the last 72 hours. Lipid Profile: No results for input(s): "CHOL", "HDL", "LDLCALC", "TRIG", "CHOLHDL", "LDLDIRECT" in the last 72 hours. Thyroid function studies: No results for input(s): "TSH", "T4TOTAL", "T3FREE", "THYROIDAB" in the last 72 hours.  Invalid input(s): "FREET3" Anemia work up: No results for input(s): "VITAMINB12", "FOLATE", "FERRITIN", "TIBC",  "IRON", "RETICCTPCT" in the last 72 hours. Sepsis Labs: Recent Labs  Lab 08/17/22 0308 08/19/22 0448 08/20/22 1126 08/21/22 0334  WBC 11.3* 12.1* 12.4* 10.8*    Microbiology Recent Results (from the past 240 hour(s))  Aerobic/Anaerobic Culture w Gram Stain (surgical/deep wound)     Status: None   Collection Time: 08/11/22  4:42 PM   Specimen: Abscess  Result Value Ref Range Status   Specimen Description ABSCESS  Final   Special Requests LIVER  Final   Gram Stain NO ORGANISMS SEEN NO WBC SEEN   Final  Culture   Final    No growth aerobically or anaerobically. Performed at [facility], [place] [address], [place], [place] [address]    Report Status 08/16/2022 FINAL  Final     Medications:    amiodarone  200 mg Oral Daily   feeding supplement  237 mL Oral BID BM   feeding supplement (OSMOLITE 1.5 CAL)  1,000 mL Per Tube Q24H   feeding supplement (PROSource TF20)  60 mL Per Tube TID   fentaNYL  1 patch Transdermal Q72H   free water  150 mL Per Tube Q4H   insulin aspart  0-9 Units Subcutaneous Q4H   insulin aspart  3 Units Subcutaneous Q4H   metoprolol tartrate  25 mg Oral BID   polyethylene glycol  17 g Oral Daily   senna-docusate  1 tablet Oral Daily   tamsulosin  0.8 mg Oral Daily   Continuous Infusions:  sodium chloride 75 mL/hr at 08/21/22 0019   heparin 1,600 Units/hr (08/20/22 2028)      LOS: 21 days   [facility]  08/21/2022, 10:13 AM

## 2022-08-21 NOTE — Progress Notes (Addendum)
Daily Progress Note   Patient Name: Douglas Hurst.       Date: 08/21/2022 DOB: November 29, 1941  Age: 81 y.o. MRN#: [id number] Attending Physician: Charlynne Cousins, MD Primary Care Physician: Lavone Orn, MD Admit Date: 07/30/2022  Reason for Consultation/Follow-up: Establishing goals of care  Subjective: Patient alert but slow to respond.  Denies complaints.  Length of Stay: 21  Current Medications: Scheduled Meds:   acetaminophen  1,000 mg Oral TID   amiodarone  200 mg Oral Daily   feeding supplement  237 mL Oral BID BM   feeding supplement (OSMOLITE 1.5 CAL)  1,000 mL Per Tube Q24H   feeding supplement (PROSource TF20)  60 mL Per Tube TID   fentaNYL  1 patch Transdermal Q72H   free water  150 mL Per Tube Q4H   insulin aspart  0-9 Units Subcutaneous Q4H   insulin aspart  3 Units Subcutaneous Q4H   metoprolol tartrate  25 mg Oral BID   polyethylene glycol  17 g Oral Daily   senna-docusate  1 tablet Oral Daily   tamsulosin  0.8 mg Oral Daily    Continuous Infusions:  sodium chloride     heparin 1,600 Units/hr (08/21/22 1217)    PRN Meds: HYDROcodone-acetaminophen, HYDROmorphone (DILAUDID) injection, ondansetron **OR** ondansetron (ZOFRAN) IV, umeclidinium bromide  Physical Exam Constitutional:      General: He is not in acute distress.    Appearance: He is ill-appearing.  Pulmonary:     Effort: Pulmonary effort is normal.  Skin:    General: Skin is warm and dry.  Neurological:     Mental Status: He is alert. He is disoriented.             Vital Signs: BP (!) 107/53 (BP Location: Left Arm)   Pulse 67   Temp 97.9 F (36.6 C) (Oral)   Resp 17   Ht _0  (1.854 m)   Wt 78.7 kg   SpO2 95%   BMI 22.89 kg/m  SpO2: SpO2: 95 % O2 Device: O2 Device: Room Air O2 Flow Rate:  O2 Flow Rate (L/min): 0.5 L/min  Intake/output summary:  Intake/Output Summary (Last 24 hours) at 08/21/2022 1748 Last data filed at 08/21/2022 1734 Gross per 24 hour  Intake 1512.94 ml  Output 3366 ml  Net -1853.06 ml   LBM: Last BM Date : 08/19/22 Baseline Weight: Weight: 80.7 kg Most recent weight: Weight: 78.7 kg       Palliative Assessment/Data: PPS 40%      Patient Active Problem List   Diagnosis Date Noted   Protein-calorie malnutrition, severe 08/12/2022   Acute pancreatitis 07/31/2022   Choledocholithiasis 07/08/2022   Hyperbilirubinemia 04/30/2022   Hypoxia 04/29/2022   Normocytic anemia 76/81/1572   Acute systolic heart failure (HCC)    PAF (paroxysmal atrial fibrillation) (HCC)    Acute calculous cholecystitis 04/26/2022   Abdominal aortic aneurysm ([place]) 04/25/2022   Bipolar disorder ([place]) 04/25/2022   Chronic obstructive pulmonary disease ([place]) 04/25/2022   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[place]) 04/25/2022   Gastroesophageal reflux disease 04/25/2022   Essential hypertension 04/25/2022   Mixed hyperlipidemia 04/25/2022   sepsis secondary to calculus of bile duct (  any) with acute cholecystitis 04/25/2022   Sepsis ([place]) 04/25/2022    Palliative Care Assessment & Plan   HPI: 81 y.o. male  with past medical history of heart failure with recovered EF, paroxysmal atrial fibrillation on amiodarone and apixaban, insulin-dependent diabetes mellitus type 2, COPD, and history of a calculus cholecystitis admitted on 07/30/2022 with abdominal pain post ERCP at [facility].  Patient diagnosed with complicated pancreatitis with pseudocyst formation.  He also required drain placement of.  Hepatic fluid collection during hospitalization.  Patient also required feeding tube placement August 16 due to poor intake.  Patient with generalized weakness and reluctant to work with therapy -SNF rehab has been recommended.  PMT consulted to discuss goals of  care.  Assessment: I have reviewed medical records including EPIC notes, labs and imaging, assessed the patient and then met with patient's spouse Jamas Lav and sister-in-law Katharine Look to discuss diagnosis prognosis, GOC, EOL wishes, disposition and options.  I introduced Palliative Medicine as specialized medical care for people living with serious illness. It focuses on providing relief from the symptoms and stress of a serious illness. The goal is to improve quality of life for both the patient and the family.  They share that patient was doing well until his hospitalization in April of this year.  They talk of multiple complications and decline in patient's status since that time.  They tell me of multiple hospitalizations and medical procedures since that time.  They tell me of his stay in rehab.  They tell me of a decline in his function needing a walker or a wheelchair.   We discussed patient's current illness and what it means in the larger context of patient's on-going co-morbidities.  Natural disease trajectory and expectations at EOL were discussed.  We discussed that though some of his conditions have improved are well managed his functional status and nutritional status are quite poor.  Wife agrees and shares she feels that he has "given up".  She reports he has no desire to try to work with therapy or eat.  We discussed multiple hospitalizations and the toll it takes on the body.  I attempted to elicit values and goals of care important to the patient.  Wife tells me this is not something they have discussed in the past.  The difference between aggressive medical intervention and comfort care was considered in light of the patient's goals of care.  Wife would like to continue aggressive medical care.  She is hopeful for discharge to rehab.  Wife expresses concern that pain is not well controlled and may be contributing to patient's reluctance to participate in therapy.  We discussed that  patient has as needed medications for his pain.  Scheduled Tylenol added.  Wife shares concern that feeding tube is adding to patient's poor appetite as it is in his way and agitating him preventing him from eating more.  We discussed eventually trialing without feeding tube and see how much he is able to sustain himself.  We discussed CODE STATUS.  Wife is initially unsure but ultimately tells me she wants patient to remain full code.  She tells me they had a family member that required resuscitation and went on to have good quality years and that is her reasoning for selecting full code.  We discussed that Mr. Nigh is quite frail and I worry he may not have the same result if he were to undergo resuscitation attempts.  She expresses understanding but for now she would like him to  remain full code.  Discussed with family the importance of continued conversation with family and the medical providers regarding overall plan of care and treatment options, ensuring decisions are within the context of the patient's values and GOCs.    Questions and concerns were addressed. The family was encouraged to call with questions or concerns.    Recommendations/Plan: Full code/full scope Wife hopeful for rehab Wife hopeful for trial off core track in coming days-she feels feeding tube hindering p.o. intake and feels he may do better without it -she would like to see how much she can support himself independently PMT will follow  Code Status: Full code  Discharge Planning: [place] for rehab with Palliative care service follow-up  Care plan was discussed with wife and sister in law  Thank you for allowing the Palliative Medicine Team to assist in the care of this patient.  *Please note that this is a verbal dictation therefore any spelling or grammatical errors are due to the "[employer]" system interpretation.  Juel Burrow, DNP, [facility] Palliative Medicine Team Team  Phone # [phone]  Pager [phone]

## 2022-08-21 NOTE — Progress Notes (Signed)
Inpatient Rehab Admissions Coordinator:   Note therapy discussion with pt/spouse regarding tolerance for CIR.  Recommendations now for SNF.  CIR will sign off at this time.    Shann Medal, PT, DPT Admissions Coordinator [phone] 08/21/22  9:57 AM

## 2022-08-21 NOTE — Progress Notes (Signed)
Nutrition Follow-up  DOCUMENTATION CODES:   Severe malnutrition in context of chronic illness  INTERVENTION:   Tube Feeds via Cortrak: Continue nocturnal feeds, Osmolite 1.5 at 80 mL/hr x 12 hours (6 pm- 6 am) 60 mL ProSource TF 20 - TID 150 mL free water flush q4h  Provides 1680 kcal, 120 gm of protein, and 1631 mL free water daily.  Continue Ensure Enlive po BID, each supplement provides 350 kcal and 20 grams of protein. Meal ordering with assist to allow pt to make choices  NUTRITION DIAGNOSIS:   Severe Malnutrition related to chronic illness (COPD, HF, acute pancreatitis) as evidenced by severe fat depletion, moderate fat depletion, severe muscle depletion, moderate muscle depletion. - Ongoing   GOAL:   Patient will meet greater than or equal to 90% of their needs - Being met via TF  MONITOR:   PO intake, I & O's, Labs, TF tolerance  REASON FOR ASSESSMENT:   Consult Enteral/tube feeding initiation and management  ASSESSMENT:   Pt admitted with abdomina pain s/p ERCP, found to have acute pancreatitis. PMH significant for bipolar d/o, T2DM, HTN, HLD, GERD, COPD, Afib, dHF, AAA, interim h/o acute cholecystitis. S/p laparoscopic cholecystectomy with laparoscopic common duct exploration 7/23  8/11 - diet advanced to full liquids 8/14 - diet advanced to SOFT diet  8/15 - s/p CT guided abdominal drain placement; diet downgraded to full liquids 8/16 - NPO; Cortrak placed (tip near distal pylorus/proximal duodenum; TF started  8/19 - diet advanced to regular 8/23 - repeat abdominal CT  Pt laying in bed, reports that he is ok. Still not eating much, eating oatmeal. Denies any nausea, vomiting, or abdominal pain from eating, TF, or drinking the Ensure's. Discussed continuing tube feeds until eating better; pt agreeable. Meal completions: 0-40% x 4 meals (average 23%)  If pt PO intake does not improve will need to increase hours that tube feeds runs to better meet pt needs.    Medications reviewed and include: NovoLog, Miralax, Senokot-S Labs reviewed: Sodium 132, BUN 24, 24 hr CBG 173-297   Diet Order:   Diet Order             Diet regular Room service appropriate? Yes; Fluid consistency: Thin  Diet effective now                   EDUCATION NEEDS:   No education needs have been identified at this time  Skin:  Skin Assessment: Skin Integrity Issues: Skin Integrity Issues:: Other (Comment) Other: RU abdomen drain  Last BM:  8/23  Height:   Ht Readings from Last 1 Encounters:  07/30/22 6' 1"  (1.854 m)    Weight:   Wt Readings from Last 1 Encounters:  08/21/22 78.7 kg    Ideal Body Weight:  83.6 kg  BMI:  Body mass index is 22.89 kg/m.  Estimated Nutritional Needs:   Kcal:  2200-2400  Protein:  115-130g  Fluid:  >/=2L    Hermina Barters RD, LDN Clinical Dietitian See [facility] for contact information.

## 2022-08-21 NOTE — Progress Notes (Signed)
[facility] Gastroenterology Progress Note  Douglas Hurst. 81 y.o. 05-24-1941  CC:  Abdominal pain, pancreatitis/duodenitis   Subjective: Patient seen and examined at bedside.  States he is doing okay today.  Still having right-sided abdominal pain near site of drain, which was replaced yesterday by IR.  Denies nausea, vomiting, fever.  Tolerating some liquid by mouth, receiving nasoduodenal tube feeds.  ROS : Review of Systems  Constitutional:  Negative for chills and fever.  Gastrointestinal:  Positive for abdominal pain. Negative for blood in stool, constipation, diarrhea, melena, nausea and vomiting.  Musculoskeletal:  Negative for falls.      Objective: Vital signs in last 24 hours: Vitals:   08/21/22 0400 08/21/22 0751  BP: (!) 121/50 (!) 140/68  Pulse: 72 82  Resp: 13 (!) 23  Temp: 97.9 F (36.6 C) 97.9 F (36.6 C)  SpO2: 95% 95%    Physical Exam:  General:  Alert, cooperative, no distress, chronically ill-appearing  Head:  Normocephalic, without obvious abnormality, atraumatic  Eyes:  Anicteric sclera, EOM's intact  Lungs:   Clear to auscultation bilaterally, respirations unlabored  Heart:  Regular rate and rhythm, S1, S2 normal  Abdomen:   Soft, right-sided abdominal tenderness to palpation with guarding, bowel sounds active, abdomen not distended  Extremities: Extremities normal, atraumatic, no  edema  Pulses: 2+ and symmetric    Lab Results: Recent Labs    08/20/22 0354 08/21/22 0334  NA 129* 131*  K 4.3 4.6  CL 93* 99  CO2 29 28  GLUCOSE 249* 233*  BUN 45* 36*  CREATININE 1.14 1.02  CALCIUM 9.6 9.2   No results for input(s): "AST", "ALT", "ALKPHOS", "BILITOT", "PROT", "ALBUMIN" in the last 72 hours. Recent Labs    08/20/22 1126 08/21/22 0334  WBC 12.4* 10.8*  NEUTROABS 9.2*  --   HGB 10.9* 10.2*  HCT 33.2* 31.3*  MCV 89.5 90.7  PLT 423* 339   No results for input(s): "LABPROT", "INR" in the last 72 hours.    Assessment Abdominal pain,  pancreatitis/duodenitis -S/p ERCP at [facility] 06/28/5365 complicated by post-ERCP pancreatitis -He underwent EUS, rendezvous ERCP and sphincterotomy with 2 stones removed.  Regular ERCP was unsuccessful.  Dr. Okey Dupre did a transduodenal approach to puncture the extrahepatic bile duct to pass a guidewire into the duodenum.  Subsequently ERCP scope was inserted and cannulation was performed over the guidewire.  Largest one was around 12 mm in size.  2 stones were removed from CBD.   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  Tiny extraluminal air bubbles again noted adjacent to the pancreatic head and duodenum.  Increased moderate bilateral pleural effusions and bilateral lower lobe atelectasis.   -S/p IR aspiration and drain placement on 08/11/2022 for perihepatic fluid collection - Repeat CT A/P 08/19/2022 shows resolving pseudocyst, overall improvement.  Interval decrease in bilateral pleural effusions with small residual effusions on both sides. -Drain replaced by IR yesterday  Plan: Complicated pancreatitis with pseudocyst formation.  Improving.  Continue tube feeds.  Hopefully drain can be removed prior to discharge or shortly after discharge. Encourage oral intake. Continue supportive care. [facility] will follow.  Angelique Holm PA-C 08/21/2022, 8:51 AM  Contact #  [phone]

## 2022-08-22 DIAGNOSIS — Z7189 Other specified counseling: Secondary | ICD-10-CM | POA: Diagnosis not present

## 2022-08-22 DIAGNOSIS — Z515 Encounter for palliative care: Secondary | ICD-10-CM | POA: Diagnosis not present

## 2022-08-22 DIAGNOSIS — E43 Unspecified severe protein-calorie malnutrition: Secondary | ICD-10-CM | POA: Diagnosis not present

## 2022-08-22 DIAGNOSIS — K858 Other acute pancreatitis without necrosis or infection: Secondary | ICD-10-CM | POA: Diagnosis not present

## 2022-08-22 LAB — GLUCOSE, CAPILLARY
Glucose-Capillary: 151 mg/dL — ABNORMAL HIGH (ref 70–99)
Glucose-Capillary: 170 mg/dL — ABNORMAL HIGH (ref 70–99)
Glucose-Capillary: 191 mg/dL — ABNORMAL HIGH (ref 70–99)
Glucose-Capillary: 201 mg/dL — ABNORMAL HIGH (ref 70–99)
Glucose-Capillary: 212 mg/dL — ABNORMAL HIGH (ref 70–99)
Glucose-Capillary: 235 mg/dL — ABNORMAL HIGH (ref 70–99)

## 2022-08-22 LAB — CBC
HCT: 31.2 % — ABNORMAL LOW (ref 39.0–52.0)
Hemoglobin: 10 g/dL — ABNORMAL LOW (ref 13.0–17.0)
MCH: 29.1 pg (ref 26.0–34.0)
MCHC: 32.1 g/dL (ref 30.0–36.0)
MCV: 90.7 fL (ref 80.0–100.0)
Platelets: 333 10*3/uL (ref 150–400)
RBC: 3.44 MIL/uL — ABNORMAL LOW (ref 4.22–5.81)
RDW: 15.6 % — ABNORMAL HIGH (ref 11.5–15.5)
WBC: 10.1 10*3/uL (ref 4.0–10.5)
nRBC: 0 % (ref 0.0–0.2)

## 2022-08-22 LAB — HEPARIN LEVEL (UNFRACTIONATED): Heparin Unfractionated: 0.58 IU/mL (ref 0.30–0.70)

## 2022-08-22 MED ORDER — INSULIN DETEMIR 100 UNIT/ML ~~LOC~~ SOLN
15.0000 [IU] | Freq: Two times a day (BID) | SUBCUTANEOUS | Status: DC
  Administered 2022-08-22 – 2022-08-28 (×10): 15 [IU] via SUBCUTANEOUS
  Filled 2022-08-22 (×15): qty 0.15

## 2022-08-22 NOTE — Progress Notes (Signed)
[facility] PROGRESS NOTE    Progress Note  Douglas Hurst.  JME:[id number] DOB: [birth year] DOA: 07/30/2022 PCP: Lavone Orn, MD     Brief Narrative:   Douglas Newsham. is an 81 y.o. male past medical history of heart failure with recovered EF, paroxysmal atrial fibrillation on amiodarone and apixaban, insulin-dependent diabetes mellitus type 2 COPD and history of a calculus cholecystitis recently discharged on April of this month with a percutaneous cholecystectomy tube, during that time he had A-fib with RVR, he also had a unsuccessful ERCP and subsequent laparoscopic cholecystectomy on 07/19/2022 with an inability to clear the bile duct, referred to [facility] for ERCP which was performed on 07/30/2022 and was discharged home returns to the ED for abdominal pain post ERCP pancreatitis with imaging concerning for necrosis GI was consulted started empirically on antibiotics and IV fluids hospitalization was complicated by A-fib with RVR now converted to sinus rhythm.  Also developed urinary retention which has improved with Flomax.  We were unable to advance his diet on 08/06/2022 CT scan showed a large perihepatic fluid collection IR was consulted and placed a drain on 08/11/2022  Assessment/Plan:   Complicated pancreatitis with pseudocyst formation post ERCP: Status post percutaneous drain on 08/11/2022.  Recommended to continue flushes. Continue dressing changes per IR instructions. Was able to tolerate his diet today without any abdominal pain continue current diet. Continue tube feedings nocturnally. He is tolerating his Ensure and likes it. Out of bed to chair, continue to work with physical therapy.  Protein-calorie malnutrition, severe Advance oral intake.  Paroxysmal atrial fibrillation with RVR: Likely precipitated by acute pancreatitis remains on amiodarone now in sinus rhythm continue metoprolol. Continue IV heparin can plan to transition to oral DOAC once no further  interventions are needed and patient can tolerate orals.  Acute respiratory failure with hypoxia: Likely due to pleural effusion.   Successfully weaned to room air.   Out of bed to chair. Encourage incentive spirometry.  Generalized weakness: Continue to work with physical therapy has been recommended for inpatient rehab.  Acute kidney injury: Resolved with diuresis creatinine has returned to baseline. Continue to hold Lasix continue normal saline. Creatinine is improving normal saline.  Hypovolemic hyponatremia: Likely due to diuresis, he is significant negative. Continue to hold Lasix sodium is improving.  Acute urinary retention/BPH: Continue Flomax.  Constipation: Continue MiraLAX and he has not had a bowel movement in 2 days.  Hypophosphatemia: Supplemented now resolved.  Acute on chronic diastolic heart failure: With a recovery EF weight is down overall with good urine output. Creatinine is stabilized continue to hold Lasix. Weight has improved since admission.  Well-controlled diabetes mellitus type 2: With an A1c of 5.6 continue NovoLog on sliding scale insulin.  Essential hypertension: Well-controlled continue current regimen.  COPD: Continue inhalers.  Infrarenal abdominal aortic aneurysm: Measuring 3.4 cm follow-up with vascular as an outpatient.  Hypokalemia: Replete orally now resolved.    DVT prophylaxis: heparin Family Communication:none Status is: Inpatient Remains inpatient appropriate because: Post ERCP pancreatitis    Code Status:     Code Status Orders  (From admission, onward)           Start     Ordered   07/31/22 0233  Full code  Continuous        07/31/22 0235           Code Status History     Date Active Date Inactive Code Status Order ID Comments User Context   07/08/2022  1532 07/14/2022 1956 Full Code [id number]  Felicie Morn, MD Inpatient   04/25/2022 1512 04/30/2022 2147 Full Code [id number]  Orma Flaming, MD ED         IV Access:   Peripheral IV   Procedures and diagnostic studies:   IR Catheter Tube Change  Result Date: 08/20/2022 INDICATION: 81 year old male with perihepatic drain and residual subcapsular/perihepatic fluid. He presents for exchange/upsize EXAM: IMAGE GUIDED DRAIN EXCHANGE/UPSIZE MEDICATIONS: None ANESTHESIA/SEDATION: None COMPLICATIONS: None PROCEDURE: Informed written consent was obtained from the patient after a thorough discussion of the procedural risks, benefits and alternatives. All questions were addressed. Maximal Sterile Barrier Technique was utilized including caps, mask, sterile gowns, sterile gloves, sterile drape, hand hygiene and skin antiseptic. A timeout was performed prior to the initiation of the procedure. Patient positioned supine under the image intensifier. The right abdomen in the indwelling drain were prepped and draped in the usual sterile fashion. 1% lidocaine was used for local anesthesia. Contrast was injected, confirming adequate location within the perihepatic fluid collection. 035 wire was advanced through the catheter and the catheter was removed. Kumpe the catheter was used in attempt to manipulate the drain into a more favorable location. Combination of an Amplatz wire and a stiff Glidewire were used. A new 12 French drain was positioned in a more cephalad location, where there was communicating fluid and the pigtail catheter was locked. Contrast was injected confirming adequate location. The drain was attached to bulb suction. Patient tolerated the procedure well and remained hemodynamically stable throughout. No complications were encountered and no significant blood loss. FINDINGS: Injection of the drain in minute elation of the drain are suggestive that there is some significant loculations/compartmentalization to the right perihepatic fluid collection, which is not evident on prior CT. New 12 French drain in the perihepatic fluid  collection. IMPRESSION: Status post exchange and up size of perihepatic biloma drain. Signed, Dulcy Fanny. Nadene Rubins, RPVI [facility] Electronically Signed   By: Corrie Mckusick D.O.   On: 08/20/2022 17:08     Medical Consultants:   None.   Subjective:    Douglas Hurst. no complaints related his abdominal pain is improved food not worsening it.  Objective:    Vitals:   08/21/22 2331 08/22/22 0400 08/22/22 0408 08/22/22 0811  BP: (!) 123/53 (!) 141/68  (!) 138/47  Pulse: 67 69  82  Resp: '12 12  14  '$ Temp: 98.2 F (36.8 C) 97.9 F (36.6 C)  (!) 97.5 F (36.4 C)  TempSrc: Oral Oral  Oral  SpO2: 96% 93%  97%  Weight:   82.5 kg   Height:       SpO2: 97 % O2 Flow Rate (L/min): 0.5 L/min   Intake/Output Summary (Last 24 hours) at 08/22/2022 1005 Last data filed at 08/22/2022 0600 Gross per 24 hour  Intake 9382.21 ml  Output 1845 ml  Net 7537.21 ml    Filed Weights   08/20/22 0500 08/21/22 0500 08/22/22 0408  Weight: 77.2 kg 78.7 kg 82.5 kg    Exam: General exam: In no acute distress. Respiratory system: Good air movement and clear to auscultation. Cardiovascular system: S1 & S2 heard, RRR. No JVD. Gastrointestinal system: Abdomen is nondistended, soft and nontender.  Extremities: No pedal edema. Skin: No rashes, lesions or ulcers Psychiatry: Judgement and insight appear normal. Mood & affect appropriate.  Data Reviewed:    Labs: Basic Metabolic Panel: Recent Labs  Lab 08/15/22 1629 08/16/22  2458 08/17/22 0308 08/18/22 0328 08/19/22 0448 08/20/22 0354 08/21/22 0334  NA  --    < > 133* 132* 132* 129* 131*  K  --    < > 4.3 4.4 4.5 4.3 4.6  CL  --    < > 94* 92* 93* 93* 99  CO2  --    < > 34* '31 29 29 28  '$ GLUCOSE  --    < > 205* 230* 146* 249* 233*  BUN  --    < > 20 24* 39* 45* 36*  CREATININE  --    < > 0.84 0.87 1.04 1.14 1.02  CALCIUM  --    < > 9.0 9.5 10.1 9.6 9.2  MG 1.8  --   --    --   --   --   --   PHOS 2.5  --   --   --   --   --   --    < > = values in this interval not displayed.    GFR Estimated Creatinine Clearance: 66.4 mL/min (by C-G formula based on SCr of 1.02 mg/dL). Liver Function Tests: No results for input(s): "AST", "ALT", "ALKPHOS", "BILITOT", "PROT", "ALBUMIN" in the last 168 hours. No results for input(s): "LIPASE", "AMYLASE" in the last 168 hours. No results for input(s): "AMMONIA" in the last 168 hours. Coagulation profile No results for input(s): "INR", "PROTIME" in the last 168 hours. COVID-19 Labs  No results for input(s): "DDIMER", "FERRITIN", "LDH", "CRP" in the last 72 hours.  No results found for: "SARSCOV2NAA"  CBC: Recent Labs  Lab 08/17/22 0308 08/19/22 0448 08/20/22 1126 08/21/22 0334 08/22/22 0604  WBC 11.3* 12.1* 12.4* 10.8* 10.1  NEUTROABS  --   --  9.2*  --   --   HGB 10.2* 11.2* 10.9* 10.2* 10.0*  HCT 30.4* 33.5* 33.2* 31.3* 31.2*  MCV 88.9 88.4 89.5 90.7 90.7  PLT 411* 428* 423* 339 333    Cardiac Enzymes: No results for input(s): "CKTOTAL", "CKMB", "CKMBINDEX", "TROPONINI" in the last 168 hours. BNP (last 3 results) No results for input(s): "PROBNP" in the last 8760 hours. CBG: Recent Labs  Lab 08/21/22 1558 08/21/22 1947 08/21/22 2354 08/22/22 0406 08/22/22 0846  GLUCAP 128* 177* 212* 170* 201*    D-Dimer: No results for input(s): "DDIMER" in the last 72 hours. Hgb A1c: No results for input(s): "HGBA1C" in the last 72 hours. Lipid Profile: No results for input(s): "CHOL", "HDL", "LDLCALC", "TRIG", "CHOLHDL", "LDLDIRECT" in the last 72 hours. Thyroid function studies: No results for input(s): "TSH", "T4TOTAL", "T3FREE", "THYROIDAB" in the last 72 hours.  Invalid input(s): "FREET3" Anemia work up: No results for input(s): "VITAMINB12", "FOLATE", "FERRITIN", "TIBC", "IRON", "RETICCTPCT" in the last 72 hours. Sepsis Labs: Recent Labs  Lab 08/19/22 0448 08/20/22 1126 08/21/22 0334  08/22/22 0604  WBC 12.1* 12.4* 10.8* 10.1    Microbiology No results found for this or any previous visit (from the past 240 hour(s)).    Medications:    acetaminophen  1,000 mg Oral TID   amiodarone  200 mg Oral Daily   feeding supplement  237 mL Oral BID BM   feeding supplement (OSMOLITE 1.5 CAL)  1,000 mL Per Tube Q24H   feeding supplement (PROSource TF20)  60 mL Per Tube TID   fentaNYL  1 patch Transdermal Q72H   free water  150 mL Per Tube Q4H   insulin aspart  0-9 Units Subcutaneous Q4H   insulin aspart  3 Units Subcutaneous  Q4H   metoprolol tartrate  25 mg Oral BID   polyethylene glycol  17 g Oral Daily   senna-docusate  1 tablet Oral Daily   tamsulosin  0.8 mg Oral Daily   Continuous Infusions:  sodium chloride 75 mL/hr at 08/22/22 0940   heparin 1,600 Units/hr (08/22/22 0420)      LOS: 22 days   Charlynne Cousins  [facility]  08/22/2022, 10:05 AM

## 2022-08-22 NOTE — Progress Notes (Signed)
Daily Progress Note   Patient Name: Douglas Hurst.       Date: 08/22/2022 DOB: [birth year]  Age: 81 y.o. MRN#: [id number] Attending Physician: Aileen Fass, Tammi Klippel, MD Primary Care Physician: Lavone Orn, MD Admit Date: 07/30/2022  Reason for Consultation/Follow-up: Establishing goals of care  Subjective: Complains of upper quadrant pain and abdomen  Length of Stay: 22  Current Medications: Scheduled Meds:   acetaminophen  1,000 mg Oral TID   amiodarone  200 mg Oral Daily   feeding supplement  237 mL Oral BID BM   feeding supplement (OSMOLITE 1.5 CAL)  1,000 mL Per Tube Q24H   feeding supplement (PROSource TF20)  60 mL Per Tube TID   fentaNYL  1 patch Transdermal Q72H   free water  150 mL Per Tube Q4H   insulin aspart  0-9 Units Subcutaneous Q4H   insulin aspart  3 Units Subcutaneous Q4H   insulin detemir  15 Units Subcutaneous BID   metoprolol tartrate  25 mg Oral BID   polyethylene glycol  17 g Oral Daily   senna-docusate  1 tablet Oral Daily   tamsulosin  0.8 mg Oral Daily    Continuous Infusions:  heparin 1,600 Units/hr (08/22/22 0420)    PRN Meds: HYDROcodone-acetaminophen, HYDROmorphone (DILAUDID) injection, ondansetron **OR** ondansetron (ZOFRAN) IV, umeclidinium bromide  Physical Exam Constitutional:      General: He is not in acute distress.    Appearance: He is ill-appearing.     Comments: Slow to respond and difficult to engage in conversation  Pulmonary:     Effort: Pulmonary effort is normal.  Skin:    General: Skin is warm and dry.  Neurological:     Mental Status: He is alert.     Comments: Easily confused  Psychiatric:     Comments: Withdrawn             Vital Signs: BP (!) 146/52 (BP Location: Left Arm)   Pulse 69   Temp (!) 97.3 F (36.3 C) (Oral)    Resp 15   Ht '6\' 1"'$  (1.854 m)   Wt 82.5 kg   SpO2 96%   BMI 24.00 kg/m  SpO2: SpO2: 96 % O2 Device: O2 Device: Room Air O2 Flow Rate: O2 Flow Rate (L/min): 0.5 L/min  Intake/output summary:  Intake/Output Summary (Last 24 hours) at 08/22/2022 1250 Last data filed at 08/22/2022 0600 Gross per 24 hour  Intake 9382.21 ml  Output 1845 ml  Net 7537.21 ml   LBM: Last BM Date : 08/21/22 Baseline Weight: Weight: 80.7 kg Most recent weight: Weight: 82.5 kg       Palliative Assessment/Data: PPS 40%      Patient Active Problem List   Diagnosis Date Noted   Protein-calorie malnutrition, severe 08/12/2022   Acute pancreatitis 07/31/2022   Choledocholithiasis 07/08/2022   Hyperbilirubinemia 04/30/2022   Hypoxia 04/29/2022   Normocytic anemia 76/28/3151   Acute systolic heart failure (HCC)    PAF (paroxysmal atrial fibrillation) (HCC)    Acute calculous cholecystitis 04/26/2022   Abdominal aortic aneurysm ([place]) 04/25/2022   Bipolar disorder ([place]) 04/25/2022   Chronic obstructive pulmonary disease ([place]) 04/25/2022   Diabetic peripheral neuropathy associated with type 2 diabetes  mellitus ([place]) 04/25/2022   Gastroesophageal reflux disease 04/25/2022   Essential hypertension 04/25/2022   Mixed hyperlipidemia 04/25/2022   sepsis secondary to calculus of bile duct (any) with acute cholecystitis 04/25/2022   Sepsis ([place]) 04/25/2022    Palliative Care Assessment & Plan   HPI: 81 y.o. male  with past medical history of heart failure with recovered EF, paroxysmal atrial fibrillation on amiodarone and apixaban, insulin-dependent diabetes mellitus type 2, COPD, and history of a calculus cholecystitis admitted on 07/30/2022 with abdominal pain post ERCP at [facility].  Patient diagnosed with complicated pancreatitis with pseudocyst formation.  He also required drain placement of.  Hepatic fluid collection during hospitalization.  Patient also required feeding tube placement August 16 due to poor  intake.  Patient with generalized weakness and reluctant to work with therapy -SNF rehab has been recommended.  PMT consulted to discuss goals of care.  Assessment: Follow up today with patient. Wife at bedside. Patient remains minimally interactive -he is alert and looking around but is slow to respond and difficult to engage in conversation. Wife reports appetite yesterday and this morning has improved.  She continues to hope that the feeding tube can come out soon as she feels like this will improve his appetite as well. Patient is complaining of some right upper quadrant abdominal pain -review of as needed medications and none has been given.  We will request nursing to administer.  Wife reports patient continues to be withdrawn and difficult to engage in activity.  She is hopeful he will engage more with PT/OT.  Wife denies any further questions or concerns -she has our number and will call as needed.  Recommendations/Plan: Full code/full scope Wife hopeful for rehab Wife has her contact info and will call with any questions/concerns; please call palliative medicine team for additional needs or decline in patient's status  Code Status: Full code  Discharge Planning: [place] for rehab with Palliative care service follow-up  Care plan was discussed with wife  Thank you for allowing the Palliative Medicine Team to assist in the care of this patient.  *Please note that this is a verbal dictation therefore any spelling or grammatical errors are due to the "[place] [employer]" system interpretation.  Juel Burrow, DNP, [facility] Palliative Medicine Team Team Phone # [phone]  Pager [phone]

## 2022-08-22 NOTE — Progress Notes (Signed)
ANTICOAGULATION CONSULT NOTE - Follow Up Consult  Pharmacy Consult for Heparin Indication: atrial fibrillation  No Known Allergies  Patient Measurements: Height: '6\' 1"'$  (185.4 cm) Weight: 82.5 kg (181 lb 14.1 oz) IBW/kg (Calculated) : 79.9 Heparin Dosing Weight: 80 kg  Vital Signs: Temp: 97.9 F (36.6 C) (08/26 0400) Temp Source: Oral (08/26 0400) BP: 141/68 (08/26 0400) Pulse Rate: 69 (08/26 0400)  Labs: Recent Labs    08/20/22 0354 08/20/22 1126 08/20/22 1126 08/21/22 0334 08/22/22 0604  HGB  --  10.9*   < > 10.2* 10.0*  HCT  --  33.2*  --  31.3* 31.2*  PLT  --  423*  --  339 333  HEPARINUNFRC 0.25*  --   --  0.53 0.58  CREATININE 1.14  --   --  1.02  --    < > = values in this interval not displayed.     Estimated Creatinine Clearance: 66.4 mL/min (by C-G formula based on SCr of 1.02 mg/dL).  Assessment: Douglas Hurst is a 81 y/o male whose past medical history includes Afib (on Eliquis prior to  admission) and CHF. Pharmacy has been consulted for IV heparin dosing, no bolus per MD. PTA Eliquis held on admission for drain placement. His last dose of Eliquis was on 07/26/22 PTA. Heparin level today is therapeutic at 0.58, on 1600 units/hr. Hgb 10 and stable, plt 333. No line issues or signs/symptoms of bleeding noted.   Goal of Therapy:  Heparin level 0.3-0.7 units/ml Monitor platelets by anticoagulation protocol: Yes   Plan:  Continue heparin at 1600 units / hr Monitor with daily heparin level and CBC Follow up for plans to transition back to Eliquis once tolerating PO and no procedures planned  Jeneen Rinks, Pharm.D PGY1 Pharmacy Resident 08/22/2022 7:25 AM

## 2022-08-23 DIAGNOSIS — K858 Other acute pancreatitis without necrosis or infection: Secondary | ICD-10-CM | POA: Diagnosis not present

## 2022-08-23 LAB — GLUCOSE, CAPILLARY
Glucose-Capillary: 188 mg/dL — ABNORMAL HIGH (ref 70–99)
Glucose-Capillary: 211 mg/dL — ABNORMAL HIGH (ref 70–99)
Glucose-Capillary: 143 mg/dL — ABNORMAL HIGH (ref 70–99)
Glucose-Capillary: 125 mg/dL — ABNORMAL HIGH (ref 70–99)
Glucose-Capillary: 115 mg/dL — ABNORMAL HIGH (ref 70–99)

## 2022-08-23 LAB — BASIC METABOLIC PANEL
Anion gap: 8 (ref 5–15)
BUN: 32 mg/dL — ABNORMAL HIGH (ref 8–23)
CO2: 26 mmol/L (ref 22–32)
Calcium: 9.5 mg/dL (ref 8.9–10.3)
Chloride: 101 mmol/L (ref 98–111)
Creatinine, Ser: 1 mg/dL (ref 0.61–1.24)
GFR, Estimated: >60 mL/min (ref >60)
Glucose, Bld: 164 mg/dL — ABNORMAL HIGH (ref 70–99)
Potassium: 4.6 mmol/L (ref 3.5–5.1)
Sodium: 135 mmol/L (ref 135–145)

## 2022-08-23 LAB — CBC
HCT: 32.1 % — ABNORMAL LOW (ref 39.0–52.0)
Hemoglobin: 10.4 g/dL — ABNORMAL LOW (ref 13.0–17.0)
MCH: 29.5 pg (ref 26.0–34.0)
MCHC: 32.4 g/dL (ref 30.0–36.0)
MCV: 90.9 fL (ref 80.0–100.0)
Platelets: 339 10*3/uL (ref 150–400)
RBC: 3.53 MIL/uL — ABNORMAL LOW (ref 4.22–5.81)
RDW: 15.6 % — ABNORMAL HIGH (ref 11.5–15.5)
WBC: 11.6 10*3/uL — ABNORMAL HIGH (ref 4.0–10.5)
nRBC: 0 % (ref 0.0–0.2)

## 2022-08-23 LAB — HEPARIN LEVEL (UNFRACTIONATED): Heparin Unfractionated: 0.56 IU/mL (ref 0.30–0.70)

## 2022-08-23 MED ORDER — SODIUM CHLORIDE 0.9% FLUSH
5.0000 mL | Freq: Three times a day (TID) | INTRAVENOUS | Status: DC
  Administered 2022-08-23 – 2022-09-08 (×41): 5 mL

## 2022-08-23 NOTE — Progress Notes (Signed)
[facility] PROGRESS NOTE    Progress Note  Douglas Hurst.  JAS:[id number] DOB: 1941-01-21 DOA: 07/30/2022 PCP: Lavone Orn, MD     Brief Narrative:   Douglas Biegel. is an 81 y.o. male past medical history of heart failure with recovered EF, paroxysmal atrial fibrillation on amiodarone and apixaban, insulin-dependent diabetes mellitus type 2 COPD and history of a calculus cholecystitis recently discharged on April of this month with a percutaneous cholecystectomy tube, during that time he had A-fib with RVR, he also had a unsuccessful ERCP and subsequent laparoscopic cholecystectomy on 07/19/2022 with an inability to clear the bile duct, referred to [facility] for ERCP which was performed on 07/30/2022 and was discharged home returns to the ED for abdominal pain post ERCP pancreatitis with imaging concerning for necrosis GI was consulted started empirically on antibiotics and IV fluids hospitalization was complicated by A-fib with RVR now converted to sinus rhythm.  Also developed urinary retention which has improved with Flomax.  We were unable to advance his diet on 08/06/2022 CT scan showed a large perihepatic fluid collection IR was consulted and placed a drain on 08/11/2022  Assessment/Plan:   Complicated pancreatitis with pseudocyst formation post ERCP: Status post percutaneous drain on 08/11/2022.  Recommended to continue flushes. Continue dressing changes per IR instructions. Was able to tolerate his diet today without any abdominal pain continue current diet. Continue tube feedings nocturnally. He is tolerating his Ensure and likes it. Out of bed to chair, continue to work with physical therapy. Tolerated his breakfast this morning about 50% he ate. We will hold nocturnal feedings transfer tonight see how he does tomorrow. Physical therapy evaluated the patient, will need to go to skilled nursing facility. TOC is involved.  Protein-calorie malnutrition, severe Continue core  track in place, nocturnal feedings for tonight. He tolerated about 25% of his meal this morning.  Paroxysmal atrial fibrillation with RVR: Likely precipitated by acute pancreatitis remains on amiodarone now in sinus rhythm continue metoprolol. Continue IV heparin can plan to transition to oral DOAC once no further interventions are needed and patient can tolerate orals.  Acute respiratory failure with hypoxia: Likely due to pleural effusion.   Successfully weaned to room air.   Out of bed to chair. Encourage incentive spirometry.  Generalized weakness: Continue to work with physical therapy has been recommended for inpatient rehab.  Acute kidney injury: Resolved with diuresis creatinine has returned to baseline. Continue to hold Lasix continue normal saline. Creatinine is improving normal saline.  Hypovolemic hyponatremia: Likely due to diuresis, he is significant negative. Continue to hold Lasix sodium is improving.  Acute urinary retention/BPH: Continue Flomax.  Constipation: Continue MiraLAX and he has not had a bowel movement in 2 days.  Hypophosphatemia: Supplemented now resolved.  Acute on chronic diastolic heart failure: With a recovery EF weight is down overall with good urine output. Creatinine is stabilized continue to hold Lasix. Weight has improved since admission.  Well-controlled diabetes mellitus type 2: With an A1c of 5.6 continue NovoLog on sliding scale insulin.  Essential hypertension: Well-controlled continue current regimen.  COPD: Continue inhalers.  Infrarenal abdominal aortic aneurysm: Measuring 3.4 cm follow-up with vascular as an outpatient.  Hypokalemia: Replete orally now resolved.    DVT prophylaxis: heparin Family Communication:none Status is: Inpatient Remains inpatient appropriate because: Post ERCP pancreatitis    Code Status:     Code Status Orders  (From admission, onward)           Start  Ordered    07/31/22 0233  Full code  Continuous        07/31/22 0235           Code Status History     Date Active Date Inactive Code Status Order ID Comments User Context   07/08/2022 1532 07/14/2022 1956 Full Code [id number]  Felicie Morn, MD Inpatient   04/25/2022 1512 04/30/2022 2147 Full Code [id number]  Orma Flaming, MD ED         IV Access:   Peripheral IV   Procedures and diagnostic studies:   No results found.   Medical Consultants:   None.   Subjective:    Douglas Hurst. rated his food this morning.  Objective:    Vitals:   08/23/22 0407 08/23/22 0500 08/23/22 0747 08/23/22 0800  BP: 120/65  134/71 136/65  Pulse: 86  85 80  Resp: (!) '22  16 17  '$ Temp: (!) 97.5 F (36.4 C)  97.8 F (36.6 C)   TempSrc: Oral  Oral   SpO2: 97%  98% 91%  Weight:  81.1 kg    Height:       SpO2: 91 % O2 Flow Rate (L/min): 0.5 L/min   Intake/Output Summary (Last 24 hours) at 08/23/2022 1019 Last data filed at 08/23/2022 0850 Gross per 24 hour  Intake 550.68 ml  Output 3535 ml  Net -2984.32 ml    Filed Weights   08/21/22 0500 08/22/22 0408 08/23/22 0500  Weight: 78.7 kg 82.5 kg 81.1 kg    Exam: General exam: In no acute distress. Respiratory system: Good air movement and clear to auscultation. Cardiovascular system: S1 & S2 heard, RRR. No JVD. Gastrointestinal system: Abdomen is nondistended, soft and nontender.  Extremities: No pedal edema. Skin: No rashes, lesions or ulcers Psychiatry: Judgement and insight appear normal. Mood & affect appropriate. Data Reviewed:    Labs: Basic Metabolic Panel: Recent Labs  Lab 08/18/22 0328 08/19/22 0448 08/20/22 0354 08/21/22 0334 08/23/22 0317  NA 132* 132* 129* 131* 135  K 4.4 4.5 4.3 4.6 4.6  CL 92* 93* 93* 99 101  CO2 '31 29 29 28 26  '$ GLUCOSE 230* 146* 249* 233* 164*  BUN 24* 39* 45* 36* 32*  CREATININE 0.87 1.04 1.14 1.02 1.00  CALCIUM 9.5 10.1 9.6 9.2 9.5    GFR Estimated Creatinine Clearance:  67.7 mL/min (by C-G formula based on SCr of 1 mg/dL). Liver Function Tests: No results for input(s): "AST", "ALT", "ALKPHOS", "BILITOT", "PROT", "ALBUMIN" in the last 168 hours. No results for input(s): "LIPASE", "AMYLASE" in the last 168 hours. No results for input(s): "AMMONIA" in the last 168 hours. Coagulation profile No results for input(s): "INR", "PROTIME" in the last 168 hours. COVID-19 Labs  No results for input(s): "DDIMER", "FERRITIN", "LDH", "CRP" in the last 72 hours.  No results found for: "SARSCOV2NAA"  CBC: Recent Labs  Lab 08/19/22 0448 08/20/22 1126 08/21/22 0334 08/22/22 0604 08/23/22 0317  WBC 12.1* 12.4* 10.8* 10.1 11.6*  NEUTROABS  --  9.2*  --   --   --   HGB 11.2* 10.9* 10.2* 10.0* 10.4*  HCT 33.5* 33.2* 31.3* 31.2* 32.1*  MCV 88.4 89.5 90.7 90.7 90.9  PLT 428* 423* 339 333 339    Cardiac Enzymes: No results for input(s): "CKTOTAL", "CKMB", "CKMBINDEX", "TROPONINI" in the last 168 hours. BNP (last 3 results) No results for input(s): "PROBNP" in the last 8760 hours. CBG: Recent Labs  Lab 08/22/22 1551 08/22/22 2000 08/22/22 2351 08/23/22  0416 08/23/22 0749  GLUCAP 151* 235* 212* 188* 211*    D-Dimer: No results for input(s): "DDIMER" in the last 72 hours. Hgb A1c: No results for input(s): "HGBA1C" in the last 72 hours. Lipid Profile: No results for input(s): "CHOL", "HDL", "LDLCALC", "TRIG", "CHOLHDL", "LDLDIRECT" in the last 72 hours. Thyroid function studies: No results for input(s): "TSH", "T4TOTAL", "T3FREE", "THYROIDAB" in the last 72 hours.  Invalid input(s): "FREET3" Anemia work up: No results for input(s): "VITAMINB12", "FOLATE", "FERRITIN", "TIBC", "IRON", "RETICCTPCT" in the last 72 hours. Sepsis Labs: Recent Labs  Lab 08/20/22 1126 08/21/22 0334 08/22/22 0604 08/23/22 0317  WBC 12.4* 10.8* 10.1 11.6*    Microbiology No results found for this or any previous visit (from the past 240 hour(s)).    Medications:     acetaminophen  1,000 mg Oral TID   amiodarone  200 mg Oral Daily   feeding supplement  237 mL Oral BID BM   feeding supplement (OSMOLITE 1.5 CAL)  1,000 mL Per Tube Q24H   feeding supplement (PROSource TF20)  60 mL Per Tube TID   fentaNYL  1 patch Transdermal Q72H   free water  150 mL Per Tube Q4H   insulin aspart  0-9 Units Subcutaneous Q4H   insulin aspart  3 Units Subcutaneous Q4H   insulin detemir  15 Units Subcutaneous BID   metoprolol tartrate  25 mg Oral BID   polyethylene glycol  17 g Oral Daily   senna-docusate  1 tablet Oral Daily   sodium chloride flush  5 mL Intracatheter Q8H   tamsulosin  0.8 mg Oral Daily   Continuous Infusions:  heparin 1,600 Units/hr (08/23/22 0434)      LOS: 23 days   Charlynne Cousins  [facility]  08/23/2022, 10:19 AM

## 2022-08-23 NOTE — Progress Notes (Signed)
Subjective: Ongoing abdominal pain, with or without eating.  Objective: Vital signs in last 24 hours: Temp:  [97.5 F (36.4 C)-98.1 F (36.7 C)] 97.7 F (36.5 C) (08/27 1225) Pulse Rate:  [67-86] 68 (08/27 1225) Resp:  [15-24] 15 (08/27 1225) BP: (120-147)/(58-71) 125/60 (08/27 1225) SpO2:  [91 %-98 %] 96 % (08/27 1225) Weight:  [81.1 kg] 81.1 kg (08/27 0500) Weight change: -1.4 kg Last BM Date : 08/23/22  PE: GEN:  NAD, cachectic, chronically debilitated-appearing HEENT:  nasojejunal tube left nare ABD:  Soft, mild generalized tenderness  Lab Results: CBC    Component Value Date/Time   WBC 11.6 (H) 08/23/2022 0317   RBC 3.53 (L) 08/23/2022 0317   HGB 10.4 (L) 08/23/2022 0317   HGB 13.5 06/15/2022 1006   HCT 32.1 (L) 08/23/2022 0317   HCT 41.3 06/15/2022 1006   PLT 339 08/23/2022 0317   PLT 219 06/15/2022 1006   MCV 90.9 08/23/2022 0317   MCV 92 06/15/2022 1006   MCH 29.5 08/23/2022 0317   MCHC 32.4 08/23/2022 0317   RDW 15.6 (H) 08/23/2022 0317   RDW 13.6 06/15/2022 1006   LYMPHSABS 0.9 08/20/2022 1126   MONOABS 1.4 (H) 08/20/2022 1126   EOSABS 0.2 08/20/2022 1126   BASOSABS 0.1 08/20/2022 1126  CMP     Component Value Date/Time   NA 135 08/23/2022 0317   NA 139 06/15/2022 1006   K 4.6 08/23/2022 0317   CL 101 08/23/2022 0317   CO2 26 08/23/2022 0317   GLUCOSE 164 (H) 08/23/2022 0317   BUN 32 (H) 08/23/2022 0317   BUN 12 06/15/2022 1006   CREATININE 1.00 08/23/2022 0317   CALCIUM 9.5 08/23/2022 0317   PROT 5.4 (L) 08/10/2022 0747   PROT 5.8 (L) 06/15/2022 1006   ALBUMIN 1.8 (L) 08/10/2022 0747   ALBUMIN 3.7 06/15/2022 1006   AST 17 08/10/2022 0747   ALT 16 08/10/2022 0747   ALKPHOS 73 08/10/2022 0747   BILITOT 0.7 08/10/2022 0747   BILITOT 0.5 06/15/2022 1006   GFRNONAA >60 08/23/2022 0317   GFRAA >60 10/02/2015 1956   Assessment:  Post-ERCP pancreatitis. Abdominal pain, ongoing. Protein-calorie malnutrition. Weakness, cachexia  Plan:   I  have de-escalated patient's diet, as he is having ongoing abdominal pain and is requiring ongoing narcotics, to soft mechanical with low fat. Continue nasoenteric tube feeds.  If patient can eat enough orally without rebound in abdominal pain, his tube feeds can be weaned down; otherwise, given his ongoing protein malnutrition, we need to continue his nasoenteric tube feeds.  He is not a candidate for PEG tube, given his pancreatitis and pseudocyst. PT working with patient is paramount. At this point, his disposition would be either to rehab facility once/if he can eat well enough to have his nasoenteric tube removed (definitely not the case currently) versus discharge home with or without nasoenteric tube feeds (depending on what he can tolerate perorally) if PT feels he is strong enough to be at home (definitely not the case currently).  Either way, he will have to be completely off IV pain medications. Until such time as one of these scenarios transpires, patient will have to remain in hospital.   I anticipate continued lengthy hospitalization of up to several more weeks. [facility] will follow every few days.  Please call us if needed otherwise.   Landry Dyke 08/23/2022, 12:57 PM   Cell [phone] If no answer or after 5 PM call [phone]

## 2022-08-23 NOTE — Progress Notes (Signed)
ANTICOAGULATION CONSULT NOTE - Follow Up Consult  Pharmacy Consult for Heparin Indication: atrial fibrillation  No Known Allergies  Patient Measurements: Height: '6\' 1"'$  (185.4 cm) Weight: 81.1 kg (178 lb 12.7 oz) IBW/kg (Calculated) : 79.9 Heparin Dosing Weight: 80 kg  Vital Signs: Temp: 97.8 F (36.6 C) (08/27 0747) Temp Source: Oral (08/27 0747) BP: 134/71 (08/27 0747) Pulse Rate: 85 (08/27 0747)  Labs: Recent Labs    08/21/22 0334 08/22/22 0604 08/23/22 0317  HGB 10.2* 10.0* 10.4*  HCT 31.3* 31.2* 32.1*  PLT 339 333 339  HEPARINUNFRC 0.53 0.58 0.56  CREATININE 1.02  --  1.00     Estimated Creatinine Clearance: 67.7 mL/min (by C-G formula based on SCr of 1 mg/dL).  Assessment: Douglas Hurst is a 81 y/o male whose past medical history includes Afib (on Eliquis prior to  admission) and CHF. Pharmacy has been consulted for IV heparin dosing, no bolus per MD. PTA Eliquis held on admission for drain placement. His last dose of Eliquis was on 07/26/22 PTA. Heparin level today is therapeutic at 0.56, on 1600 units/hr. Hgb 10.4 and stable, plt 339. No line issues or signs/symptoms of bleeding noted.   Goal of Therapy:  Heparin level 0.3-0.7 units/ml Monitor platelets by anticoagulation protocol: Yes   Plan:  Continue heparin at 1600 units/hr Monitor with daily heparin level and CBC Follow up for plans to transition back to Eliquis once tolerating PO and no procedures planned  Jeneen Rinks, Pharm.D PGY1 Pharmacy Resident 08/23/2022 8:04 AM

## 2022-08-24 DIAGNOSIS — E43 Unspecified severe protein-calorie malnutrition: Secondary | ICD-10-CM | POA: Diagnosis not present

## 2022-08-24 DIAGNOSIS — K858 Other acute pancreatitis without necrosis or infection: Secondary | ICD-10-CM | POA: Diagnosis not present

## 2022-08-24 LAB — CBC
HCT: 32.1 % — ABNORMAL LOW (ref 39.0–52.0)
Hemoglobin: 10.6 g/dL — ABNORMAL LOW (ref 13.0–17.0)
MCH: 29.5 pg (ref 26.0–34.0)
MCHC: 33 g/dL (ref 30.0–36.0)
MCV: 89.4 fL (ref 80.0–100.0)
Platelets: 347 10*3/uL (ref 150–400)
RBC: 3.59 MIL/uL — ABNORMAL LOW (ref 4.22–5.81)
RDW: 15.8 % — ABNORMAL HIGH (ref 11.5–15.5)
WBC: 11.6 10*3/uL — ABNORMAL HIGH (ref 4.0–10.5)
nRBC: 0 % (ref 0.0–0.2)

## 2022-08-24 LAB — HEPARIN LEVEL (UNFRACTIONATED)
Heparin Unfractionated: 0.76 IU/mL — ABNORMAL HIGH (ref 0.30–0.70)
Heparin Unfractionated: 0.59 IU/mL (ref 0.30–0.70)

## 2022-08-24 LAB — GLUCOSE, CAPILLARY
Glucose-Capillary: 103 mg/dL — ABNORMAL HIGH (ref 70–99)
Glucose-Capillary: 133 mg/dL — ABNORMAL HIGH (ref 70–99)
Glucose-Capillary: 152 mg/dL — ABNORMAL HIGH (ref 70–99)
Glucose-Capillary: 78 mg/dL (ref 70–99)
Glucose-Capillary: 83 mg/dL (ref 70–99)
Glucose-Capillary: 84 mg/dL (ref 70–99)

## 2022-08-24 MED ORDER — POLYETHYLENE GLYCOL 3350 17 G PO PACK
17.0000 g | PACK | Freq: Two times a day (BID) | ORAL | Status: DC
  Administered 2022-08-24 – 2022-09-06 (×9): 17 g via ORAL
  Filled 2022-08-24 (×25): qty 1

## 2022-08-24 NOTE — Progress Notes (Signed)
ANTICOAGULATION CONSULT NOTE - Follow Up Consult  Pharmacy Consult for Heparin Indication: atrial fibrillation  No Known Allergies  Patient Measurements: Height: '6\' 1"'$  (185.4 cm) Weight: 81.1 kg (178 lb 12.7 oz) IBW/kg (Calculated) : 79.9 Heparin Dosing Weight: 80 kg  Vital Signs: Temp: 97.7 F (36.5 C) (08/28 0415) Temp Source: Oral (08/28 0415) BP: 116/58 (08/28 0030) Pulse Rate: 71 (08/28 0415)  Labs: Recent Labs    08/22/22 0604 08/23/22 0317 08/24/22 0319  HGB 10.0* 10.4* 10.6*  HCT 31.2* 32.1* 32.1*  PLT 333 339 347  HEPARINUNFRC 0.58 0.56 0.76*  CREATININE  --  1.00  --      Estimated Creatinine Clearance: 67.7 mL/min (by C-G formula based on SCr of 1 mg/dL).  Assessment: Douglas Hurst is a 81 y/o male whose past medical history includes Afib (on Eliquis prior to  admission) and CHF. Pharmacy has been consulted for IV heparin dosing, no bolus per MD. PTA Eliquis held on admission for drain placement. His last dose of Eliquis was on 07/26/22 PTA. Heparin level today is therapeutic at 0.56, on 1600 units/hr. Hgb 10.4 and stable, plt 339. No line issues or signs/symptoms of bleeding noted.  8/28 AM update:  Heparin level elevated   Goal of Therapy:  Heparin level 0.3-0.7 units/ml Monitor platelets by anticoagulation protocol: Yes   Plan:  Dec heparin to 1450 units/hr 1300 heparin level Follow up for plans to transition back to Eliquis once tolerating PO and no procedures planned  Narda Bonds, PharmD, [place] [facility] Phone: [phone]

## 2022-08-24 NOTE — Progress Notes (Signed)
Subjective: C/O discomfort from nasoenteric tube. No bowel movement for 2 days, but passing gas. Continued abdominal pain, 8 out of 10 in intensity. Out of bed to chair, has not performed physical therapy.  Objective: Vital signs in last 24 hours: Temp:  [97.4 F (36.3 C)-97.9 F (36.6 C)] 97.9 F (36.6 C) (08/28 0754) Pulse Rate:  [68-79] 79 (08/28 0754) Resp:  [12-21] 20 (08/28 0754) BP: (116-142)/(51-66) 142/51 (08/28 0754) SpO2:  [93 %-96 %] 94 % (08/28 0415) Weight:  [80.2 kg] 80.2 kg (08/28 0500) Weight change: -0.9 kg Last BM Date : 08/23/22  PE: Elderly, slightly pale, nonicteric GENERAL: Sitting on bedside chair, appears comfortable otherwise  ABDOMEN: Drain noted in right upper quadrant draining slightly bilious, serous fluid, otherwise nontender with normoactive bowel sounds EXTREMITIES: Compression stockings in bilateral legs, no pedal edema  Lab Results: Results for orders placed or performed during the hospital encounter of 07/30/22 (from the past 48 hour(s))  Glucose, capillary     Status: Abnormal   Collection Time: 08/22/22 12:03 PM  Result Value Ref Range   Glucose-Capillary 191 (H) 70 - 99 mg/dL    Comment: Glucose reference range applies only to samples taken after fasting for at least 8 hours.  Glucose, capillary     Status: Abnormal   Collection Time: 08/22/22  3:51 PM  Result Value Ref Range   Glucose-Capillary 151 (H) 70 - 99 mg/dL    Comment: Glucose reference range applies only to samples taken after fasting for at least 8 hours.  Glucose, capillary     Status: Abnormal   Collection Time: 08/22/22  8:00 PM  Result Value Ref Range   Glucose-Capillary 235 (H) 70 - 99 mg/dL    Comment: Glucose reference range applies only to samples taken after fasting for at least 8 hours.  Glucose, capillary     Status: Abnormal   Collection Time: 08/22/22 11:51 PM  Result Value Ref Range   Glucose-Capillary 212 (H) 70 - 99 mg/dL    Comment: Glucose reference  range applies only to samples taken after fasting for at least 8 hours.  Heparin level (unfractionated)     Status: None   Collection Time: 08/23/22  3:17 AM  Result Value Ref Range   Heparin Unfractionated 0.56 0.30 - 0.70 IU/mL    Comment: (NOTE) The clinical reportable range upper limit is being lowered to >1.10 to align with the FDA approved guidance for the current laboratory assay.  If heparin results are below expected values, and patient dosage has  been confirmed, suggest follow up testing of antithrombin III levels. Performed at [facility], [place] [address], [place], [place] [address]   CBC     Status: Abnormal   Collection Time: 08/23/22  3:17 AM  Result Value Ref Range   WBC 11.6 (H) 4.0 - 10.5 K/uL   RBC 3.53 (L) 4.22 - 5.81 MIL/uL   Hemoglobin 10.4 (L) 13.0 - 17.0 g/dL   HCT 32.1 (L) 39.0 - 52.0 %   MCV 90.9 80.0 - 100.0 fL   MCH 29.5 26.0 - 34.0 pg   MCHC 32.4 30.0 - 36.0 g/dL   RDW 15.6 (H) 11.5 - 15.5 %   Platelets 339 150 - 400 K/uL   nRBC 0.0 0.0 - 0.2 %    Comment: Performed at [facility], [place] [address] [place], [place] [address]  Basic metabolic panel     Status: Abnormal   Collection Time: 08/23/22  3:17 AM  Result Value Ref Range   Sodium 135 135 - 145 mmol/L   Potassium 4.6 3.5 - 5.1 mmol/L   Chloride 101 98 - 111 mmol/L   CO2 26 22 - 32 mmol/L   Glucose, Bld 164 (H) 70 - 99 mg/dL    Comment: Glucose reference range applies only to samples taken after fasting for at least 8 hours.   BUN 32 (H) 8 - 23 mg/dL   Creatinine, Ser 1.00 0.61 - 1.24 mg/dL   Calcium 9.5 8.9 - 10.3 mg/dL   GFR, Estimated >60 >60 mL/min    Comment: (NOTE) Calculated using the CKD-EPI Creatinine Equation (2021)    Anion gap 8 5 - 15    Comment: Performed at [facility] [address], [place], [place] [address]  Glucose, capillary     Status: Abnormal   Collection Time: 08/23/22  4:16 AM  Result Value Ref Range   Glucose-Capillary 188 (H) 70 - 99  mg/dL    Comment: Glucose reference range applies only to samples taken after fasting for at least 8 hours.  Glucose, capillary     Status: Abnormal   Collection Time: 08/23/22  7:49 AM  Result Value Ref Range   Glucose-Capillary 211 (H) 70 - 99 mg/dL    Comment: Glucose reference range applies only to samples taken after fasting for at least 8 hours.  Glucose, capillary     Status: Abnormal   Collection Time: 08/23/22 12:27 PM  Result Value Ref Range   Glucose-Capillary 143 (H) 70 - 99 mg/dL    Comment: Glucose reference range applies only to samples taken after fasting for at least 8 hours.  Glucose, capillary     Status: Abnormal   Collection Time: 08/23/22  4:29 PM  Result Value Ref Range   Glucose-Capillary 125 (H) 70 - 99 mg/dL    Comment: Glucose reference range applies only to samples taken after fasting for at least 8 hours.  Glucose, capillary     Status: Abnormal   Collection Time: 08/23/22  8:21 PM  Result Value Ref Range   Glucose-Capillary 115 (H) 70 - 99 mg/dL    Comment: Glucose reference range applies only to samples taken after fasting for at least 8 hours.  Glucose, capillary     Status: None   Collection Time: 08/24/22 12:27 AM  Result Value Ref Range   Glucose-Capillary 84 70 - 99 mg/dL    Comment: Glucose reference range applies only to samples taken after fasting for at least 8 hours.  Heparin level (unfractionated)     Status: Abnormal   Collection Time: 08/24/22  3:19 AM  Result Value Ref Range   Heparin Unfractionated 0.76 (H) 0.30 - 0.70 IU/mL    Comment: (NOTE) The clinical reportable range upper limit is being lowered to >1.10 to align with the FDA approved guidance for the current laboratory assay.  If heparin results are below expected values, and patient dosage has  been confirmed, suggest follow up testing of antithrombin III levels. Performed at [facility], [place] [address] [place] [address]   CBC     Status: Abnormal    Collection Time: 08/24/22  3:19 AM  Result Value Ref Range   WBC 11.6 (H) 4.0 - 10.5 K/uL   RBC 3.59 (L) 4.22 - 5.81 MIL/uL   Hemoglobin 10.6 (L) 13.0 - 17.0 g/dL   HCT 32.1 (L) 39.0 - 52.0 %   MCV 89.4 80.0 - 100.0 fL  MCH 29.5 26.0 - 34.0 pg   MCHC 33.0 30.0 - 36.0 g/dL   RDW 15.8 (H) 11.5 - 15.5 %   Platelets 347 150 - 400 K/uL   nRBC 0.0 0.0 - 0.2 %    Comment: Performed at [facility], [place] [address], [place], [place] [address]  Glucose, capillary     Status: None   Collection Time: 08/24/22  4:11 AM  Result Value Ref Range   Glucose-Capillary 78 70 - 99 mg/dL    Comment: Glucose reference range applies only to samples taken after fasting for at least 8 hours.  Glucose, capillary     Status: None   Collection Time: 08/24/22  7:56 AM  Result Value Ref Range   Glucose-Capillary 83 70 - 99 mg/dL    Comment: Glucose reference range applies only to samples taken after fasting for at least 8 hours.    Studies/Results: No results found.  Medications: I have reviewed the patient's current medications.  Assessment: Pancreatitis-3.7 x 1.5 cm loculated fluid along anterior aspect of pancreas with interval decrease in size, resolving pancreatitis IR drain placement for loculated fluid, 3 cm ,along the lateral aspect of liver  Malnutrition -total protein 5.4, albumin 1.8  Normocytic anemia, hemoglobin 10.6, MCV 89.4 Normal LFTs  Plan: On dysphagia level 3 diet As per his wife he has been tolerating food from home(sausage and biscuit, pinto beans and corn bread) Patient is receiving Ensure plus 237 mL twice a day between meals and Osmolite 1.5, nocturnal feeds at 80 mL/h for 12 hours from 6 PM to 6 AM  Patient is on  fentanyl 12 mcg/h patch every 72 hours,  hydrocodone/acetaminophen 1 tablet every 6 hours as needed, hydromorphone 0.5 mg IV every 4 hours as needed, but continues to complain of 8 out of 10 abdominal pain.  No bowel movements, on senna 1 tablet daily and  MiraLAX 17 g p.o. daily. Discussed with patient and family members that with narcotics and reduced mobility, cannot stay patient is expected. Encouraged mobilization and physical therapy.  Continue supportive management. Our goal will be to ensure patient can maintain adequate nutrition orally with plans to hopefully, subsequently remove nasoenteric tube and adequate pain control.  Ronnette Juniper, MD 08/24/2022, 9:48 AM

## 2022-08-24 NOTE — Progress Notes (Signed)
Supervising Physician: Douglas Hurst  Patient Status:  Douglas Hurst  Chief Complaint:  perihepatic fluid collection   Subjective:  Resting in bed Wife bedside Noticing more OP   Allergies: Patient has no known allergies.  Medications: Prior to Admission medications   Medication Sig Start Date End Date Taking? Authorizing Provider  acetaminophen (TYLENOL) 325 MG tablet Take 2 tablets (650 mg total) by mouth every 6 (six) hours as needed for mild pain (or Fever >/= 101). 04/30/22  Yes Douglas Hurst, Douglas Latif, DO  amiodarone (PACERONE) 200 MG tablet Take 1 tablet (200 mg total) by mouth daily. 05/29/22  Yes Douglas Dubonnet, NP  atorvastatin (LIPITOR) 40 MG tablet Take 40 mg by mouth every evening.   Yes [provider]  lisinopril-hydrochlorothiazide (ZESTORETIC) 10-12.5 MG tablet Take 0.5 tablets by mouth daily.   Yes [provider]  metFORMIN (GLUCOPHAGE-XR) 500 MG 24 hr tablet Take 500-1,000 mg by mouth See admin instructions. Take 2 tablets (1000 mg) by mouth in the morning and 1 tablet (500 mg) every evening 03/17/22  Yes [provider]  metoprolol tartrate (LOPRESSOR) 25 MG tablet Take 1 tablet (25 mg total) by mouth 2 (two) times daily. 05/29/22  Yes Douglas Dubonnet, NP  Multiple Vitamin (MULTIVITAMIN WITH MINERALS) TABS tablet Take 1 tablet by mouth daily.   Yes [provider]  ondansetron (ZOFRAN) 4 MG tablet Take 1 tablet (4 mg total) by mouth every 6 (six) hours as needed for nausea. 04/30/22  Yes Douglas Hurst, Douglas Latif, DO  pantoprazole (PROTONIX) 40 MG tablet Take 40 mg by mouth every morning.   Yes [provider]  polyethylene glycol powder (GLYCOLAX/MIRALAX) 17 GM/SCOOP powder Take 17 g by mouth daily. Patient taking differently: Take 17 g by mouth daily as needed for mild constipation or moderate constipation. 04/30/22  Yes Douglas Hurst, Douglas Latif, DO  senna-docusate (SENOKOT-S) 8.6-50 MG tablet Take 1 tablet by mouth at  bedtime. Patient taking differently: Take 1 tablet by mouth at bedtime as needed for mild constipation or moderate constipation. 04/30/22  Yes Douglas Hurst, Douglas Latif, DO  SPIRIVA RESPIMAT 2.5 MCG/ACT AERS Inhale 2 puffs into the lungs daily as needed (COPD). 06/04/22  Yes [provider]  tadalafil (CIALIS) 20 MG tablet Take 20 mg by mouth every three (3) days as needed for erectile dysfunction. 03/29/22  Yes [provider]  tamsulosin (FLOMAX) 0.4 MG CAPS capsule Take 0.4 mg by mouth daily after supper. 05/13/22  Yes [provider]  apixaban (ELIQUIS) 5 MG TABS tablet Take 1 tablet (5 mg total) by mouth 2 (two) times daily. Patient not taking: Reported on 07/31/2022 05/29/22   Douglas Dubonnet, NP  oxyCODONE-acetaminophen (PERCOCET) 5-325 MG tablet Take 1 tablet by mouth every 4 (four) hours as needed for severe pain. Patient not taking: Reported on 07/31/2022 07/14/22 07/14/23  Douglas Parcel, Douglas Hurst     Vital Signs: BP (!) 142/51 (BP Location: Right Arm)   Pulse 79   Temp 97.9 F (36.6 C) (Oral)   Resp 20   Ht '6\' 1"'$  (1.854 m)   Wt 176 lb 12.9 oz (80.2 kg)   SpO2 94%   BMI 23.33 kg/m   Physical Exam Constitutional:      General: He is not in acute distress.    Appearance: He is not toxic-appearing.  HENT:     Head: Normocephalic and atraumatic.  Eyes:     Extraocular Movements: Extraocular movements intact.  Cardiovascular:  Rate and Rhythm: Normal rate.  Pulmonary:     Effort: Pulmonary effort is normal. No respiratory distress.  Abdominal:     General: Abdomen is flat.     Palpations: Abdomen is soft.  Skin:    General: Skin is dry.  Neurological:     General: No focal deficit present.     Mental Status: He is alert.  Psychiatric:        Mood and Affect: Mood normal.        Behavior: Behavior normal.     Imaging: IR Catheter Tube Change  Result Date: 08/20/2022 INDICATION: 81 year old male with perihepatic drain and residual  subcapsular/perihepatic fluid. He presents for exchange/upsize EXAM: IMAGE GUIDED DRAIN EXCHANGE/UPSIZE MEDICATIONS: None ANESTHESIA/SEDATION: None COMPLICATIONS: None PROCEDURE: Informed written consent was obtained from the patient after a thorough discussion of the procedural risks, benefits and alternatives. All questions were addressed. Maximal Sterile Barrier Technique was utilized including caps, mask, sterile gowns, sterile gloves, sterile drape, hand hygiene and skin antiseptic. A timeout was performed prior to the initiation of the procedure. Patient positioned supine under the image intensifier. The right abdomen in the indwelling drain were prepped and draped in the usual sterile fashion. 1% lidocaine was used for local anesthesia. Contrast was injected, confirming adequate location within the perihepatic fluid collection. 035 wire was advanced through the catheter and the catheter was removed. Kumpe the catheter was used in attempt to manipulate the drain into a more favorable location. Combination of an Amplatz wire and a stiff Glidewire were used. A new 12 French drain was positioned in a more cephalad location, where there was communicating fluid and the pigtail catheter was locked. Contrast was injected confirming adequate location. The drain was attached to bulb suction. Patient tolerated the procedure well and remained hemodynamically stable throughout. No complications were encountered and no significant blood loss. FINDINGS: Injection of the drain in minute elation of the drain are suggestive that there is some significant loculations/compartmentalization to the right perihepatic fluid collection, which is not evident on prior CT. New 12 French drain in the perihepatic fluid collection. IMPRESSION: Status post exchange and up size of perihepatic biloma drain. Signed, Douglas Hurst. Douglas Hurst, RPVI [facility] Radiology Electronically Signed   By:  Douglas Hurst D.O.   On: 08/20/2022 17:08    Labs:  CBC: Recent Labs    08/21/22 0334 08/22/22 0604 08/23/22 0317 08/24/22 0319  WBC 10.8* 10.1 11.6* 11.6*  HGB 10.2* 10.0* 10.4* 10.6*  HCT 31.3* 31.2* 32.1* 32.1*  PLT 339 333 339 347    COAGS: Recent Labs    04/26/22 0843 08/01/22 1832 08/02/22 0703 08/11/22 0941  INR 1.3*  --   --  1.1  APTT  --  121* 73*  --     BMP: Recent Labs    08/19/22 0448 08/20/22 0354 08/21/22 0334 08/23/22 0317  NA 132* 129* 131* 135  K 4.5 4.3 4.6 4.6  CL 93* 93* 99 101  CO2 '29 29 28 26  '$ GLUCOSE 146* 249* 233* 164*  BUN 39* 45* 36* 32*  CALCIUM 10.1 9.6 9.2 9.5  CREATININE 1.04 1.14 1.02 1.00  GFRNONAA >60 >60 >60 >60    LIVER FUNCTION TESTS: Recent Labs    08/01/22 0717 08/02/22 0703 08/03/22 0819 08/10/22 0747  BILITOT 1.1 1.0 1.0 0.7  AST '22 19 18 17  '$ ALT '23 20 17 16  '$ ALKPHOS 53 72 64 73  PROT 5.2* 5.5* 5.0* 5.4*  ALBUMIN 2.4* 2.3* 2.0* 1.8*    Assessment and Plan:  Drain Location: RUQ Size: Fr size: 12 Fr Date of placement: 08/21/22 Currently to: Drain collection device: suction bulb 24 hour output:  Output by Drain (mL) 08/22/22 0700 - 08/22/22 1459 08/22/22 1500 - 08/22/22 2259 08/22/22 2300 - 08/23/22 0659 08/23/22 0700 - 08/23/22 1459 08/23/22 1500 - 08/23/22 2259 08/23/22 2300 - 08/24/22 0659 08/24/22 0700 - 08/24/22 1436  Closed System Drain 1 Lateral;Right Abdomen 12 Fr.  '30 15   30     '$ Current examination: Flushes/aspirates easily.  Insertion site unremarkable. Suture in place. Dressed appropriately.   Plan: Continue TID flushes with 5 cc NS. Record output Q shift. Dressing changes QD or PRN if soiled.  Call IR APP or on call IR MD if difficulty flushing or sudden change in drain output.  Repeat imaging/possible drain injection once output < 10 mL/QD (excluding flush material). Consideration for drain removal if output is < 10 mL/QD (excluding flush material), pending discussion with the  providing surgical service.  Discharge planning: Please contact IR APP or on call IR MD prior to patient d/c to ensure appropriate follow up plans are in place. Typically patient will follow up with IR clinic 10-14 days post d/c for repeat imaging/possible drain injection. IR scheduler will contact patient with date/time of appointment. Patient will need to flush drain QD with 5 cc NS, record output QD, dressing changes every 2-3 days or earlier if soiled.   IR will continue to follow - please call with questions or concerns.     Electronically Signed: Pasty Spillers, PA 08/24/2022, 2:33 PM   I spent a total of 15 Minutes at the the patient's bedside AND on the patient's hospital floor or unit, greater than 50% of which was counseling/coordinating care for perihepatic fluid collection

## 2022-08-24 NOTE — Progress Notes (Signed)
[facility] PROGRESS NOTE    Progress Note  Douglas Friedlander.  OZD:[id number] DOB: July 05, 1941 DOA: 07/30/2022 PCP: Lavone Orn, MD     Brief Narrative:   Douglas Fambro. is an 81 y.o. male past medical history of heart failure with recovered EF, paroxysmal atrial fibrillation on amiodarone and apixaban, insulin-dependent diabetes mellitus type 2 COPD and history of a calculus cholecystitis recently discharged on April of this month with a percutaneous cholecystectomy tube, during that time he had A-fib with RVR, he also had a unsuccessful ERCP and subsequent laparoscopic cholecystectomy on 07/19/2022 with an inability to clear the bile duct, referred to [facility] for ERCP which was performed on 07/30/2022 and was discharged home returns to the ED for abdominal pain post ERCP pancreatitis with imaging concerning for necrosis GI was consulted started empirically on antibiotics and IV fluids hospitalization was complicated by A-fib with RVR now converted to sinus rhythm.  Also developed urinary retention which has improved with Flomax.  We were unable to advance his diet on 08/06/2022 CT scan showed a large perihepatic fluid collection IR was consulted and placed a drain on 08/11/2022  Assessment/Plan:   Complicated pancreatitis with pseudocyst formation post ERCP: Status post percutaneous drain on 08/11/2022.  Recommended to continue flushes. Continue dressing changes per IR instructions. Tolerated his diet today at about 50% of his breakfast without any abdominal pain. He is tolerating his Ensure and likes it. Out of bed to chair, continue to work with physical therapy. Physical therapy evaluated the patient, will need to go to skilled nursing facility. TOC is involved. Continue to hold nocturnal feeds. Out of bed to chair encourage ambulation continue work with physical therapy.  Protein-calorie malnutrition, severe Continue core track in place, hold nocturnal feedings. Eating well 50% of  his meals.  Paroxysmal atrial fibrillation with RVR: Likely precipitated by acute pancreatitis remains on amiodarone now in sinus rhythm continue metoprolol. Continue IV heparin can plan to transition to oral DOAC once no further interventions are needed and patient can tolerate orals.  Acute respiratory failure with hypoxia: Likely due to pleural effusion.   Successfully weaned to room air.   Out of bed to chair. Encourage incentive spirometry.  Generalized weakness: Continue to work with physical therapy has been recommended for inpatient rehab.  Acute kidney injury: Resolved with diuresis creatinine has returned to baseline. Continue to hold Lasix continue normal saline. Creatinine is improving normal saline.  Hypovolemic hyponatremia: Likely due to diuresis, he is significant negative. Continue to hold Lasix sodium is improving.  Acute urinary retention/BPH: Continue Flomax.  Constipation: Continue MiraLAX and he has not had a bowel movement in 2 days.  Hypophosphatemia: Supplemented now resolved.  Acute on chronic diastolic heart failure: With a recovery EF weight is down overall with good urine output. Creatinine is stabilized continue to hold Lasix. Weight has improved since admission.  Well-controlled diabetes mellitus type 2: With an A1c of 5.6 continue NovoLog on sliding scale insulin.  Essential hypertension: Well-controlled continue current regimen.  COPD: Continue inhalers.  Infrarenal abdominal aortic aneurysm: Measuring 3.4 cm follow-up with vascular as an outpatient.  Hypokalemia: Replete orally now resolved.    DVT prophylaxis: heparin Family Communication:none Status is: Inpatient Remains inpatient appropriate because: Post ERCP pancreatitis    Code Status:     Code Status Orders  (From admission, onward)           Start     Ordered   07/31/22 0233  Full code  Continuous  07/31/22 0235           Code Status  History     Date Active Date Inactive Code Status Order ID Comments User Context   07/08/2022 1532 07/14/2022 1956 Full Code [id number]  Felicie Morn, MD Inpatient   04/25/2022 1512 04/30/2022 2147 Full Code [id number]  Orma Flaming, MD ED         IV Access:   Peripheral IV   Procedures and diagnostic studies:   No results found.   Medical Consultants:   None.   Subjective:    Douglas Friedlander. continues to tolerate his diet.  Objective:    Vitals:   08/24/22 0030 08/24/22 0415 08/24/22 0500 08/24/22 0754  BP: (!) 116/58   (!) 142/51  Pulse: 69 71  79  Resp: (!) '21 15  20  '$ Temp: 97.7 F (36.5 C) 97.7 F (36.5 C)  97.9 F (36.6 C)  TempSrc: Oral Oral  Oral  SpO2: 95% 94%    Weight:   80.2 kg   Height:       SpO2: 94 % O2 Flow Rate (L/min): 0.5 L/min   Intake/Output Summary (Last 24 hours) at 08/24/2022 1145 Last data filed at 08/24/2022 0400 Gross per 24 hour  Intake 0 ml  Output 1530 ml  Net -1530 ml    Filed Weights   08/22/22 0408 08/23/22 0500 08/24/22 0500  Weight: 82.5 kg 81.1 kg 80.2 kg    Exam: General exam: In no acute distress. Respiratory system: Good air movement and clear to auscultation. Cardiovascular system: S1 & S2 heard, RRR. No JVD. Gastrointestinal system: Abdomen is nondistended, soft and nontender.  Extremities: No pedal edema. Skin: No rashes, lesions or ulcers Psychiatry: Judgement and insight appear normal. Mood & affect appropriate. Data Reviewed:    Labs: Basic Metabolic Panel: Recent Labs  Lab 08/18/22 0328 08/19/22 0448 08/20/22 0354 08/21/22 0334 08/23/22 0317  NA 132* 132* 129* 131* 135  K 4.4 4.5 4.3 4.6 4.6  CL 92* 93* 93* 99 101  CO2 '31 29 29 28 26  '$ GLUCOSE 230* 146* 249* 233* 164*  BUN 24* 39* 45* 36* 32*  CREATININE 0.87 1.04 1.14 1.02 1.00  CALCIUM 9.5 10.1 9.6 9.2 9.5    GFR Estimated Creatinine Clearance: 67.7 mL/min (by C-G formula based on SCr of 1 mg/dL). Liver Function  Tests: No results for input(s): "AST", "ALT", "ALKPHOS", "BILITOT", "PROT", "ALBUMIN" in the last 168 hours. No results for input(s): "LIPASE", "AMYLASE" in the last 168 hours. No results for input(s): "AMMONIA" in the last 168 hours. Coagulation profile No results for input(s): "INR", "PROTIME" in the last 168 hours. COVID-19 Labs  No results for input(s): "DDIMER", "FERRITIN", "LDH", "CRP" in the last 72 hours.  No results found for: "SARSCOV2NAA"  CBC: Recent Labs  Lab 08/20/22 1126 08/21/22 0334 08/22/22 0604 08/23/22 0317 08/24/22 0319  WBC 12.4* 10.8* 10.1 11.6* 11.6*  NEUTROABS 9.2*  --   --   --   --   HGB 10.9* 10.2* 10.0* 10.4* 10.6*  HCT 33.2* 31.3* 31.2* 32.1* 32.1*  MCV 89.5 90.7 90.7 90.9 89.4  PLT 423* 339 333 339 347    Cardiac Enzymes: No results for input(s): "CKTOTAL", "CKMB", "CKMBINDEX", "TROPONINI" in the last 168 hours. BNP (last 3 results) No results for input(s): "PROBNP" in the last 8760 hours. CBG: Recent Labs  Lab 08/23/22 1629 08/23/22 2021 08/24/22 0027 08/24/22 0411 08/24/22 0756  GLUCAP 125* 115* 84 78 83  D-Dimer: No results for input(s): "DDIMER" in the last 72 hours. Hgb A1c: No results for input(s): "HGBA1C" in the last 72 hours. Lipid Profile: No results for input(s): "CHOL", "HDL", "LDLCALC", "TRIG", "CHOLHDL", "LDLDIRECT" in the last 72 hours. Thyroid function studies: No results for input(s): "TSH", "T4TOTAL", "T3FREE", "THYROIDAB" in the last 72 hours.  Invalid input(s): "FREET3" Anemia work up: No results for input(s): "VITAMINB12", "FOLATE", "FERRITIN", "TIBC", "IRON", "RETICCTPCT" in the last 72 hours. Sepsis Labs: Recent Labs  Lab 08/21/22 0334 08/22/22 0604 08/23/22 0317 08/24/22 0319  WBC 10.8* 10.1 11.6* 11.6*    Microbiology No results found for this or any previous visit (from the past 240 hour(s)).    Medications:    acetaminophen  1,000 mg Oral TID   amiodarone  200 mg Oral Daily   feeding  supplement  237 mL Oral BID BM   feeding supplement (OSMOLITE 1.5 CAL)  1,000 mL Per Tube Q24H   feeding supplement (PROSource TF20)  60 mL Per Tube TID   fentaNYL  1 patch Transdermal Q72H   free water  150 mL Per Tube Q4H   insulin aspart  0-9 Units Subcutaneous Q4H   insulin detemir  15 Units Subcutaneous BID   metoprolol tartrate  25 mg Oral BID   polyethylene glycol  17 g Oral BID   senna-docusate  1 tablet Oral Daily   sodium chloride flush  5 mL Intracatheter Q8H   tamsulosin  0.8 mg Oral Daily   Continuous Infusions:  heparin 1,450 Units/hr (08/24/22 0443)      LOS: 24 days   Douglas Hurst  [facility]  08/24/2022, 11:45 AM

## 2022-08-24 NOTE — Progress Notes (Signed)
Physical Therapy Treatment Patient Details Name: Douglas Hurst. MRN: [id number] DOB: February 20, 1941 Today's Date: 08/24/2022   History of Present Illness Patient is a 81 yo male presenting to the ED with severe abdominal pain with radiation to right shoulder on 07/31/22. Patient had underwent ECRP for removal of stones from biliary and pancreatic duct on 8/3. Admitted with acute pancreatitis. Patient with cortrak and Perihepatic drain placement. PMH includes: bipolar d/o DMII, HTN, HLD, GERD, COPD ,  Afib ,diastolic heart failure, AAA with interim history of acute cholecystitis.    PT Comments    Pt more open to PT today, pt's brother and wife present during session. PT focus of session was on improving pt activity tolerance during functional mobility tasks. Pt tolerated x6 sit<>stands, short-distance gait in room, and LE exercise. Pt complaining of abdominal pain mostly when moving sit>supine, otherwise did not note pain. Pt progressing slowly, will continue to follow.     Recommendations for follow up therapy are one component of a multi-disciplinary discharge planning process, led by the attending physician.  Recommendations may be updated based on patient status, additional functional criteria and insurance authorization.  Follow Up Recommendations  Skilled nursing-short term rehab (<3 hours/day) Can patient physically be transported by private vehicle: Yes   Assistance Recommended at Discharge Frequent or constant Supervision/Assistance  Patient can return home with the following A lot of help with walking and/or transfers;A lot of help with bathing/dressing/bathroom;Assistance with cooking/housework;Direct supervision/assist for medications management;Direct supervision/assist for financial management;Assist for transportation;Help with stairs or ramp for entrance   Equipment Recommendations  None recommended by PT    Recommendations for Other Services       Precautions /  Restrictions Precautions Precautions: Fall Precaution Comments: cortrak,  RUQ drain Restrictions Weight Bearing Restrictions: No     Mobility  Bed Mobility Overal bed mobility: Needs Assistance Bed Mobility: Rolling, Sit to Supine Rolling: Min assist     Sit to supine: Mod assist   General bed mobility comments: pt up in chair upon PT arrival to room, requires light truncal assist for roll and mod LE lift assist and boost assist upon return to supine.    Transfers Overall transfer level: Needs assistance Equipment used: Rolling walker (2 wheels) Transfers: Sit to/from Stand Sit to Stand: Mod assist           General transfer comment: assist for power up, rise, steadying, cues for sequencing and correct hand placement when rising/sitting    Ambulation/Gait Ambulation/Gait assistance: +2 safety/equipment, Min assist Gait Distance (Feet): 7 Feet Assistive device: Rolling walker (2 wheels) Gait Pattern/deviations: Trunk flexed, Narrow base of support, Shuffle, Step-through pattern, Decreased stride length Gait velocity: decr     General Gait Details: assist to steady, guide RW, support trunk, +2 for chair follow   Stairs             Wheelchair Mobility    Modified Rankin (Stroke Patients Only)       Balance Overall balance assessment: Needs assistance Sitting-balance support: Feet supported, Bilateral upper extremity supported Sitting balance-Leahy Scale: Fair Sitting balance - Comments: able to sit EOB without PT assist Postural control: Left lateral lean Standing balance support: During functional activity, Reliant on assistive device for balance Standing balance-Leahy Scale: Poor Standing balance comment: steadying and RW assist                            Cognition Arousal/Alertness: Awake/alert Behavior During Therapy: Flat  affect Overall Cognitive Status: Impaired/Different from baseline Area of Impairment: Problem solving,  Awareness, Safety/judgement, Attention, Following commands                   Current Attention Level: Selective   Following Commands: Follows one step commands with increased time Safety/Judgement: Decreased awareness of safety Awareness: Emergent Problem Solving: Requires verbal cues, Decreased initiation, Difficulty sequencing General Comments: pt more open to PT today, pt's brother and wife present during session. Pt with continued flat affect but does not get disgruntled        Exercises General Exercises - Lower Extremity Hip Flexion/Marching: AROM, Both, 10 reps, Standing (2x6 reps in standing) Other Exercises Other Exercises: Sit<>stands x6 throughout session, cues for anterior leaning and power up    General Comments        Pertinent Vitals/Pain Pain Assessment Pain Assessment: Faces Faces Pain Scale: Hurts even more Pain Location: abdomen, when moving sit>supine Pain Descriptors / Indicators: Grimacing, Guarding, Discomfort Pain Intervention(s): Limited activity within patient's tolerance, Monitored during session, Repositioned    Home Living                          Prior Function            PT Goals (current goals can now be found in the care plan section) Acute Rehab PT Goals Patient Stated Goal: to get stronger and pain reduced. PT Goal Formulation: With patient/family Time For Goal Achievement: 09/07/22 Potential to Achieve Goals: Good Progress towards PT goals: Progressing toward goals    Frequency    Min 2X/week      PT Plan Current plan remains appropriate    Co-evaluation              AM-PAC PT "6 Clicks" Mobility   Outcome Measure  Help needed turning from your back to your side while in a flat bed without using bedrails?: A Little Help needed moving from lying on your back to sitting on the side of a flat bed without using bedrails?: A Lot Help needed moving to and from a bed to a chair (including a  wheelchair)?: A Lot Help needed standing up from a chair using your arms (e.g., wheelchair or bedside chair)?: A Lot Help needed to walk in hospital room?: A Lot Help needed climbing 3-5 steps with a railing? : Total 6 Click Score: 12    End of Session Equipment Utilized During Treatment: Gait belt Activity Tolerance: Patient limited by fatigue Patient left: with call bell/phone within reach;in bed;with bed alarm set;with family/visitor present Nurse Communication: Mobility status PT Visit Diagnosis: Muscle weakness (generalized) (M62.81);Difficulty in walking, not elsewhere classified (R26.2);Other abnormalities of gait and mobility (R26.89);Pain Pain - part of body:  (abdomen)     Time: 3419-6222 PT Time Calculation (min) (ACUTE ONLY): 21 min  Charges:  $Therapeutic Activity: 8-22 mins                     Stacie Glaze, PT DPT Acute Rehabilitation Services Pager [phone]  Office [phone]   Louis Matte 08/24/2022, 4:38 PM

## 2022-08-24 NOTE — Progress Notes (Signed)
ANTICOAGULATION CONSULT NOTE - Follow Up Consult  Pharmacy Consult for Heparin Indication: atrial fibrillation  No Known Allergies  Patient Measurements: Height: '6\' 1"'$  (185.4 cm) Weight: 80.2 kg (176 lb 12.9 oz) IBW/kg (Calculated) : 79.9 Heparin Dosing Weight: 80 kg  Vital Signs: Temp: 97.9 F (36.6 C) (08/28 0754) Temp Source: Oral (08/28 0754) BP: 142/51 (08/28 0754) Pulse Rate: 79 (08/28 0754)  Labs: Recent Labs    08/22/22 0604 08/23/22 0317 08/24/22 0319 08/24/22 1323  HGB 10.0* 10.4* 10.6*  --   HCT 31.2* 32.1* 32.1*  --   PLT 333 339 347  --   HEPARINUNFRC 0.58 0.56 0.76* 0.59  CREATININE  --  1.00  --   --      Estimated Creatinine Clearance: 67.7 mL/min (by C-G formula based on SCr of 1 mg/dL).  Assessment: GJ is a 81 y/o male whose past medical history includes Afib (on Eliquis prior to  admission) and CHF. Pharmacy has been consulted for IV heparin dosing, no bolus per MD. PTA Eliquis held on admission for drain placement. His last dose of Eliquis was on 07/26/22 PTA. Heparin level today is therapeutic at 0.59, on 1450 units/hr. Hgb 10.6 and stable, plt 347. No line issues or signs/symptoms of bleeding noted.  Goal of Therapy:  Heparin level 0.3-0.7 units/ml Monitor platelets by anticoagulation protocol: Yes   Plan:  Continue heparin at 1450 units/hr heparin level in am Follow up for plans to transition back to Eliquis once tolerating PO and no procedures planned  Izayah Miner A. Levada Dy, PharmD, BCPS, FNKF Clinical Pharmacist [place] Please utilize Amion for appropriate phone number to reach the unit pharmacist ([place])

## 2022-08-25 DIAGNOSIS — E43 Unspecified severe protein-calorie malnutrition: Secondary | ICD-10-CM | POA: Diagnosis not present

## 2022-08-25 DIAGNOSIS — K858 Other acute pancreatitis without necrosis or infection: Secondary | ICD-10-CM | POA: Diagnosis not present

## 2022-08-25 LAB — GLUCOSE, CAPILLARY
Glucose-Capillary: 110 mg/dL — ABNORMAL HIGH (ref 70–99)
Glucose-Capillary: 215 mg/dL — ABNORMAL HIGH (ref 70–99)
Glucose-Capillary: 155 mg/dL — ABNORMAL HIGH (ref 70–99)
Glucose-Capillary: 105 mg/dL — ABNORMAL HIGH (ref 70–99)

## 2022-08-25 LAB — BASIC METABOLIC PANEL
Anion gap: 7 (ref 5–15)
BUN: 25 mg/dL — ABNORMAL HIGH (ref 8–23)
CO2: 27 mmol/L (ref 22–32)
Calcium: 9.9 mg/dL (ref 8.9–10.3)
Chloride: 103 mmol/L (ref 98–111)
Creatinine, Ser: 1.07 mg/dL (ref 0.61–1.24)
GFR, Estimated: >60 mL/min (ref >60)
Glucose, Bld: 88 mg/dL (ref 70–99)
Potassium: 4.5 mmol/L (ref 3.5–5.1)
Sodium: 137 mmol/L (ref 135–145)

## 2022-08-25 LAB — CBC
HCT: 31.7 % — ABNORMAL LOW (ref 39.0–52.0)
Hemoglobin: 10.6 g/dL — ABNORMAL LOW (ref 13.0–17.0)
MCH: 29.9 pg (ref 26.0–34.0)
MCHC: 33.4 g/dL (ref 30.0–36.0)
MCV: 89.5 fL (ref 80.0–100.0)
Platelets: 351 10*3/uL (ref 150–400)
RBC: 3.54 MIL/uL — ABNORMAL LOW (ref 4.22–5.81)
RDW: 15.9 % — ABNORMAL HIGH (ref 11.5–15.5)
WBC: 10.4 10*3/uL (ref 4.0–10.5)
nRBC: 0 % (ref 0.0–0.2)

## 2022-08-25 LAB — HEPARIN LEVEL (UNFRACTIONATED): Heparin Unfractionated: 0.43 IU/mL (ref 0.30–0.70)

## 2022-08-25 MED ORDER — SODIUM CHLORIDE 0.9 % IV BOLUS
1000.0000 mL | Freq: Once | INTRAVENOUS | Status: AC
  Administered 2022-08-25: 1000 mL via INTRAVENOUS

## 2022-08-25 NOTE — Progress Notes (Signed)
Subjective: Patient states he continues to have pain, about 5 out of 10 in intensity. It appears that he finishes about 50% of his meals. Although he has been out of bed to chair, he has not been able to participate in physical therapy.  Objective: Vital signs in last 24 hours: Temp:  [97.8 F (36.6 C)-98.1 F (36.7 C)] 97.8 F (36.6 C) (08/29 0400) Pulse Rate:  [68-81] 80 (08/29 0400) Resp:  [16-17] 17 (08/29 0400) BP: (114-126)/(66-69) 123/66 (08/29 0400) SpO2:  [95 %-98 %] 98 % (08/29 0400) Weight change:  Last BM Date : 08/23/22  PE: Thinly built, not in distress, nasoenteric tube in place GENERAL: Mild pallor, without obvious icterus ABDOMEN: Soft, drain noted over right upper quadrant with clear bilious fluid EXTREMITIES: No deformity  Lab Results: Results for orders placed or performed during the hospital encounter of 07/30/22 (from the past 48 hour(s))  Glucose, capillary     Status: Abnormal   Collection Time: 08/23/22 12:27 PM  Result Value Ref Range   Glucose-Capillary 143 (H) 70 - 99 mg/dL    Comment: Glucose reference range applies only to samples taken after fasting for at least 8 hours.  Glucose, capillary     Status: Abnormal   Collection Time: 08/23/22  4:29 PM  Result Value Ref Range   Glucose-Capillary 125 (H) 70 - 99 mg/dL    Comment: Glucose reference range applies only to samples taken after fasting for at least 8 hours.  Glucose, capillary     Status: Abnormal   Collection Time: 08/23/22  8:21 PM  Result Value Ref Range   Glucose-Capillary 115 (H) 70 - 99 mg/dL    Comment: Glucose reference range applies only to samples taken after fasting for at least 8 hours.  Glucose, capillary     Status: None   Collection Time: 08/24/22 12:27 AM  Result Value Ref Range   Glucose-Capillary 84 70 - 99 mg/dL    Comment: Glucose reference range applies only to samples taken after fasting for at least 8 hours.  Heparin level (unfractionated)     Status: Abnormal    Collection Time: 08/24/22  3:19 AM  Result Value Ref Range   Heparin Unfractionated 0.76 (H) 0.30 - 0.70 IU/mL    Comment: (NOTE) The clinical reportable range upper limit is being lowered to >1.10 to align with the FDA approved guidance for the current laboratory assay.  If heparin results are below expected values, and patient dosage has  been confirmed, suggest follow up testing of antithrombin III levels. Performed at [facility], [place] [address] [place], [place] [address]   CBC     Status: Abnormal   Collection Time: 08/24/22  3:19 AM  Result Value Ref Range   WBC 11.6 (H) 4.0 - 10.5 K/uL   RBC 3.59 (L) 4.22 - 5.81 MIL/uL   Hemoglobin 10.6 (L) 13.0 - 17.0 g/dL   HCT 32.1 (L) 39.0 - 52.0 %   MCV 89.4 80.0 - 100.0 fL   MCH 29.5 26.0 - 34.0 pg   MCHC 33.0 30.0 - 36.0 g/dL   RDW 15.8 (H) 11.5 - 15.5 %   Platelets 347 150 - 400 K/uL   nRBC 0.0 0.0 - 0.2 %    Comment: Performed at [facility], [place] [address], [place], [place] [address]  Glucose, capillary     Status: None   Collection Time: 08/24/22  4:11 AM  Result Value Ref Range   Glucose-Capillary 78 70 -  99 mg/dL    Comment: Glucose reference range applies only to samples taken after fasting for at least 8 hours.  Glucose, capillary     Status: None   Collection Time: 08/24/22  7:56 AM  Result Value Ref Range   Glucose-Capillary 83 70 - 99 mg/dL    Comment: Glucose reference range applies only to samples taken after fasting for at least 8 hours.  Glucose, capillary     Status: Abnormal   Collection Time: 08/24/22 12:14 PM  Result Value Ref Range   Glucose-Capillary 152 (H) 70 - 99 mg/dL    Comment: Glucose reference range applies only to samples taken after fasting for at least 8 hours.  Heparin level (unfractionated)     Status: None   Collection Time: 08/24/22  1:23 PM  Result Value Ref Range   Heparin Unfractionated 0.59 0.30 - 0.70 IU/mL    Comment: (NOTE) The clinical reportable range upper limit  is being lowered to >1.10 to align with the FDA approved guidance for the current laboratory assay.  If heparin results are below expected values, and patient dosage has  been confirmed, suggest follow up testing of antithrombin III levels. Performed at [facility], [place] [address] [place], [place] [address]   Glucose, capillary     Status: Abnormal   Collection Time: 08/24/22  3:34 PM  Result Value Ref Range   Glucose-Capillary 133 (H) 70 - 99 mg/dL    Comment: Glucose reference range applies only to samples taken after fasting for at least 8 hours.  Glucose, capillary     Status: Abnormal   Collection Time: 08/24/22  9:14 PM  Result Value Ref Range   Glucose-Capillary 103 (H) 70 - 99 mg/dL    Comment: Glucose reference range applies only to samples taken after fasting for at least 8 hours.  Heparin level (unfractionated)     Status: None   Collection Time: 08/25/22  2:50 AM  Result Value Ref Range   Heparin Unfractionated 0.43 0.30 - 0.70 IU/mL    Comment: (NOTE) The clinical reportable range upper limit is being lowered to >1.10 to align with the FDA approved guidance for the current laboratory assay.  If heparin results are below expected values, and patient dosage has  been confirmed, suggest follow up testing of antithrombin III levels. Performed at [facility], [place] [address], [place], [place] [address]   CBC     Status: Abnormal   Collection Time: 08/25/22  2:50 AM  Result Value Ref Range   WBC 10.4 4.0 - 10.5 K/uL   RBC 3.54 (L) 4.22 - 5.81 MIL/uL   Hemoglobin 10.6 (L) 13.0 - 17.0 g/dL   HCT 31.7 (L) 39.0 - 52.0 %   MCV 89.5 80.0 - 100.0 fL   MCH 29.9 26.0 - 34.0 pg   MCHC 33.4 30.0 - 36.0 g/dL   RDW 15.9 (H) 11.5 - 15.5 %   Platelets 351 150 - 400 K/uL   nRBC 0.0 0.0 - 0.2 %    Comment: Performed at [facility], [place] [address] [place], [place] [address]  Glucose, capillary     Status: Abnormal   Collection Time: 08/25/22  8:16 AM   Result Value Ref Range   Glucose-Capillary 110 (H) 70 - 99 mg/dL    Comment: Glucose reference range applies only to samples taken after fasting for at least 8 hours.    Studies/Results: No results found.  Medications: I have reviewed the  patient's current medications.  Assessment: Complicated pancreatitis with pseudocyst and perihepatic fluid collection No leukocytosis, mild normocytic anemia, normal renal function, malnutrition with albumin 1.8  Plan: Continue supportive management, continue nasoenteric feeding at night if patient can tolerate, encourage mobilization.  Ronnette Juniper, MD 08/25/2022, 10:35 AM

## 2022-08-25 NOTE — Plan of Care (Signed)

## 2022-08-25 NOTE — Progress Notes (Signed)
[facility] PROGRESS NOTE    Progress Note  Douglas Hurst.  QMG:[id number] DOB: Jul 25, 1941 DOA: 07/30/2022 PCP: Lavone Orn, MD     Brief Narrative:   Douglas Mckeithan. is an 81 y.o. male past medical history of heart failure with recovered EF, paroxysmal atrial fibrillation on amiodarone and apixaban, insulin-dependent diabetes mellitus type 2 COPD and history of a calculus cholecystitis recently discharged on April of this month with a percutaneous cholecystectomy tube, during that time he had A-fib with RVR, he also had a unsuccessful ERCP and subsequent laparoscopic cholecystectomy on 07/19/2022 with an inability to clear the bile duct, referred to [facility] for ERCP which was performed on 07/30/2022 and was discharged home returns to the ED for abdominal pain post ERCP pancreatitis with imaging concerning for necrosis GI was consulted started empirically on antibiotics and IV fluids hospitalization was complicated by A-fib with RVR now converted to sinus rhythm.  Also developed urinary retention which has improved with Flomax.  We were unable to advance his diet on 08/06/2022 CT scan showed a large perihepatic fluid collection IR was consulted and placed a drain on 08/11/2022  Assessment/Plan:   Complicated pancreatitis with pseudocyst formation post ERCP: Status post percutaneous drain on 08/11/2022.  Recommended to continue flushes. Continue dressing changes per IR instructions. Tolerated his diet today at ate 100% of his breakfast without any abdominal pain. He is tolerating his Ensure and likes it. Out of bed to chair, continue to work with physical therapy. Physical therapy evaluated the patient, will need to go to skilled nursing facility. TOC is involved. Gi on board, appreciate assistance. Out of bed to chair encourage ambulation continue work with physical therapy.  Protein-calorie malnutrition, severe Continue core track in place, hold nocturnal feedings. Hopefully tube can  be d/c within 48 hrs. Ate 100% of his breakfast.  Paroxysmal atrial fibrillation with RVR: Likely precipitated by acute pancreatitis remains on amiodarone now in sinus rhythm continue metoprolol. Continue IV heparin can plan to transition to oral DOAC once no further interventions are needed and patient can tolerate orals.  Acute respiratory failure with hypoxia: Likely due to pleural effusion.   Successfully weaned to room air.   Out of bed to chair. Encourage incentive spirometry. Cont. To work with PT, will need SNF.  Generalized weakness: Continue to work with physical therapy has been recommended forSNF.  Acute kidney injury: Resolved with diuresis creatinine has returned to baseline. Continue to hold Lasix continue normal saline. Creatinine has improved to baseline. Check a b-met.  Hypovolemic hyponatremia: Likely due to diuresis, he is significant negative. Continue to hold Lasix sodium is improving.  Acute urinary retention/BPH: Continue Flomax.  Constipation: Continue MiraLAX and he has not had a bowel movement in 2 days.  Hypophosphatemia: Supplemented now resolved.  Acute on chronic diastolic heart failure: With a recovery EF weight is down overall with good urine output. Creatinine is stabilized continue to hold Lasix. Weight has improved since admission.  Well-controlled diabetes mellitus type 2: With an A1c of 5.6 continue NovoLog on sliding scale insulin.  Essential hypertension: Well-controlled continue current regimen.  COPD: Continue inhalers.  Infrarenal abdominal aortic aneurysm: Measuring 3.4 cm follow-up with vascular as an outpatient.  Hypokalemia: Replete orally now resolved.    DVT prophylaxis: heparin Family Communication:none Status is: Inpatient Remains inpatient appropriate because: Post ERCP pancreatitis    Code Status:     Code Status Orders  (From admission, onward)  Start     Ordered   07/31/22 0233   Full code  Continuous        07/31/22 0235           Code Status History     Date Active Date Inactive Code Status Order ID Comments User Context   07/08/2022 1532 07/14/2022 1956 Full Code [id number]  Felicie Morn, MD Inpatient   04/25/2022 1512 04/30/2022 2147 Full Code [id number]  Orma Flaming, MD ED         IV Access:   Peripheral IV   Procedures and diagnostic studies:   No results found.   Medical Consultants:   None.   Subjective:    Douglas Hurst. In a good mood this am tolerate all of his breakfast  Objective:    Vitals:   08/24/22 0754 08/24/22 2000 08/25/22 0000 08/25/22 0400  BP: (!) 142/51 126/69 114/67 123/66  Pulse: 79 81 68 80  Resp: 20 16 17 17   Temp: 97.9 F (36.6 C) 98.1 F (36.7 C) 98.1 F (36.7 C) 97.8 F (36.6 C)  TempSrc: Oral Oral Oral Oral  SpO2:  95% 95% 98%  Weight:      Height:       SpO2: 98 % O2 Flow Rate (L/min): 0.5 L/min   Intake/Output Summary (Last 24 hours) at 08/25/2022 1044 Last data filed at 08/25/2022 1000 Gross per 24 hour  Intake 1116.9 ml  Output 1581 ml  Net -464.1 ml    Filed Weights   08/22/22 0408 08/23/22 0500 08/24/22 0500  Weight: 82.5 kg 81.1 kg 80.2 kg    Exam: General exam: In no acute distress. Respiratory system: Good air movement and clear to auscultation. Cardiovascular system: S1 & S2 heard, RRR. No JVD. Gastrointestinal system: Abdomen is nondistended, soft and nontender.  Extremities: No pedal edema. Skin: No rashes, lesions or ulcers Psychiatry: Judgement and insight appear normal. Mood & affect appropriate. Data Reviewed:    Labs: Basic Metabolic Panel: Recent Labs  Lab 08/19/22 0448 08/20/22 0354 08/21/22 0334 08/23/22 0317  NA 132* 129* 131* 135  K 4.5 4.3 4.6 4.6  CL 93* 93* 99 101  CO2 29 29 28 26   GLUCOSE 146* 249* 233* 164*  BUN 39* 45* 36* 32*  CREATININE 1.04 1.14 1.02 1.00  CALCIUM 10.1 9.6 9.2 9.5    GFR Estimated Creatinine Clearance:  67.7 mL/min (by C-G formula based on SCr of 1 mg/dL). Liver Function Tests: No results for input(s): "AST", "ALT", "ALKPHOS", "BILITOT", "PROT", "ALBUMIN" in the last 168 hours. No results for input(s): "LIPASE", "AMYLASE" in the last 168 hours. No results for input(s): "AMMONIA" in the last 168 hours. Coagulation profile No results for input(s): "INR", "PROTIME" in the last 168 hours. COVID-19 Labs  No results for input(s): "DDIMER", "FERRITIN", "LDH", "CRP" in the last 72 hours.  No results found for: "SARSCOV2NAA"  CBC: Recent Labs  Lab 08/20/22 1126 08/21/22 0334 08/22/22 0604 08/23/22 0317 08/24/22 0319 08/25/22 0250  WBC 12.4* 10.8* 10.1 11.6* 11.6* 10.4  NEUTROABS 9.2*  --   --   --   --   --   HGB 10.9* 10.2* 10.0* 10.4* 10.6* 10.6*  HCT 33.2* 31.3* 31.2* 32.1* 32.1* 31.7*  MCV 89.5 90.7 90.7 90.9 89.4 89.5  PLT 423* 339 333 339 347 351    Cardiac Enzymes: No results for input(s): "CKTOTAL", "CKMB", "CKMBINDEX", "TROPONINI" in the last 168 hours. BNP (last 3 results) No results for input(s): "PROBNP" in the  last 8760 hours. CBG: Recent Labs  Lab 08/24/22 0756 08/24/22 1214 08/24/22 1534 08/24/22 2114 08/25/22 0816  GLUCAP 83 152* 133* 103* 110*    D-Dimer: No results for input(s): "DDIMER" in the last 72 hours. Hgb A1c: No results for input(s): "HGBA1C" in the last 72 hours. Lipid Profile: No results for input(s): "CHOL", "HDL", "LDLCALC", "TRIG", "CHOLHDL", "LDLDIRECT" in the last 72 hours. Thyroid function studies: No results for input(s): "TSH", "T4TOTAL", "T3FREE", "THYROIDAB" in the last 72 hours.  Invalid input(s): "FREET3" Anemia work up: No results for input(s): "VITAMINB12", "FOLATE", "FERRITIN", "TIBC", "IRON", "RETICCTPCT" in the last 72 hours. Sepsis Labs: Recent Labs  Lab 08/22/22 0604 08/23/22 0317 08/24/22 0319 08/25/22 0250  WBC 10.1 11.6* 11.6* 10.4    Microbiology No results found for this or any previous visit (from the  past 240 hour(s)).    Medications:    acetaminophen  1,000 mg Oral TID   amiodarone  200 mg Oral Daily   feeding supplement  237 mL Oral BID BM   feeding supplement (OSMOLITE 1.5 CAL)  1,000 mL Per Tube Q24H   feeding supplement (PROSource TF20)  60 mL Per Tube TID   fentaNYL  1 patch Transdermal Q72H   free water  150 mL Per Tube Q4H   insulin aspart  0-9 Units Subcutaneous Q4H   insulin detemir  15 Units Subcutaneous BID   metoprolol tartrate  25 mg Oral BID   polyethylene glycol  17 g Oral BID   senna-docusate  1 tablet Oral Daily   sodium chloride flush  5 mL Intracatheter Q8H   tamsulosin  0.8 mg Oral Daily   Continuous Infusions:  heparin 1,450 Units/hr (08/25/22 0647)      LOS: 25 days   Charlynne Cousins  [facility]  08/25/2022, 10:44 AM

## 2022-08-25 NOTE — Progress Notes (Signed)
ANTICOAGULATION CONSULT NOTE - Follow Up Consult  Pharmacy Consult for Heparin Indication: atrial fibrillation  No Known Allergies  Patient Measurements: Height: '6\' 1"'$  (185.4 cm) Weight: 80.2 kg (176 lb 12.9 oz) IBW/kg (Calculated) : 79.9 Heparin Dosing Weight: 80 kg  Vital Signs: Temp: 97.8 F (36.6 C) (08/29 0400) Temp Source: Oral (08/29 0400) BP: 123/66 (08/29 0400) Pulse Rate: 80 (08/29 0400)  Labs: Recent Labs    08/23/22 0317 08/24/22 0319 08/24/22 1323 08/25/22 0250  HGB 10.4* 10.6*  --  10.6*  HCT 32.1* 32.1*  --  31.7*  PLT 339 347  --  351  HEPARINUNFRC 0.56 0.76* 0.59 0.43  CREATININE 1.00  --   --   --     Estimated Creatinine Clearance: 67.7 mL/min (by C-G formula based on SCr of 1 mg/dL).  [name], no bolus per MD. Last dose of Eliquis was on 07/26/22 PTA.   Heparin level remains therapeutic (0.43) on 1450 units/hr. CBC stable.  Goal of Therapy:  Heparin level 0.3-0.7 units/ml Monitor platelets by anticoagulation protocol: Yes   Plan:  Continue heparin drip at 1450 units/hr Daily heparin level and CBC. PTA Eliquis on hold. Follow up for transition back to Eliquis when tolerating PO and no further procedures planned.   Arty Baumgartner, RPh 08/25/2022,11:30 AM

## 2022-08-25 NOTE — Progress Notes (Signed)
Occupational Therapy Treatment Patient Details Name: Douglas Hurst. MRN: [id number] DOB: August 16, 1941 Today's Date: 08/25/2022   History of present illness Patient is a 81 yo male presenting to the ED with severe abdominal pain with radiation to right shoulder on 07/31/22. Patient had underwent ECRP for removal of stones from biliary and pancreatic duct on 8/3. Admitted with acute pancreatitis. Patient with cortrak and Perihepatic drain placement. PMH includes: bipolar d/o DMII, HTN, HLD, GERD, COPD ,  Afib ,diastolic heart failure, AAA with interim history of acute cholecystitis.   OT comments  Patient progressing and showed improved tolerance to 90 second stand to allow for peri care and strengthening, compared to previous session. Pt with limited strength and became mildly tremulous while standing. Pt able to stand from recliner to [place] with Mod Assist after being in chair for 4 hours, pivot to [facility], stand from [facility] with Min As and pivot to EOB after prolonged stand with Mod Assist.  Pt needs cues for safety and keeping RW close when ambulating.  Patient remains limited by decreased mood/motivation for therapy, generalized weakness and decreased activity tolerance along with deficits noted below. Pt continues to demonstrate good rehab potential with vitals stable after session, and would benefit from continued skilled OT to increase safety and independence with ADLs and functional transfers to allow pt to return home safely and reduce caregiver burden and fall risk.    Recommendations for follow up therapy are one component of a multi-disciplinary discharge planning process, led by the attending physician.  Recommendations may be updated based on patient status, additional functional criteria and insurance authorization.    Follow Up Recommendations  Skilled nursing-short term rehab (<3 hours/day)    Assistance Recommended at Discharge Frequent or constant Supervision/Assistance  Patient can return  home with the following  Assist for transportation;Help with stairs or ramp for entrance;Direct supervision/assist for medications management;A lot of help with walking and/or transfers;A lot of help with bathing/dressing/bathroom;Assistance with cooking/housework   Equipment Recommendations   (Will defer to post-acute recommendations)    Recommendations for Other Services      Precautions / Restrictions Precautions Precautions: Fall Precaution Comments: cortrak,  RUQ drain Restrictions Weight Bearing Restrictions: No       Mobility Bed Mobility Overal bed mobility: Needs Assistance Bed Mobility: Sit to Sidelying         Sit to sidelying: Mod assist      Transfers                         Balance Overall balance assessment: Needs assistance Sitting-balance support: Feet supported, Bilateral upper extremity supported Sitting balance-Leahy Scale: Fair     Standing balance support: During functional activity, Reliant on assistive device for balance, Bilateral upper extremity supported Standing balance-Leahy Scale: Poor Standing balance comment: steadying and RW assist                           ADL either performed or assessed with clinical judgement   ADL Overall ADL's : Needs assistance/impaired     Grooming: Wash/dry hands;Set up;Sitting                   Toilet Transfer: Moderate assistance;Stand-pivot;Rolling walker (2 wheels);BSC/3in1;Minimal assistance;Cueing for safety Toilet Transfer Details (indicate cue type and reason): Pt stood from recliner to RW with Moderate assist. Found to be soiled. Pt took ~5 pivot steps to [facility]. Min As to lower to  BSC. Pt stood from [facility] with cues for hands and Min As. Pt took another 5 steps with RW and Min-Mod As to pivot to EOB and lowered to EOB with Min As. Toileting- Clothing Manipulation and Hygiene: Total assistance;+2 for physical assistance;Sit to/from stand Toileting - Clothing Manipulation  Details (indicate cue type and reason): Pt stood from [facility] and tolerated a complete 90 second stand to allow RN to provide peri hygiene with Total As.  Pt did not require a sitting rest break to then pivot to EOB as above.     Functional mobility during ADLs: Minimal assistance;Moderate assistance;Rolling walker (2 wheels)      Extremity/Trunk Assessment Upper Extremity Assessment Upper Extremity Assessment: Generalized weakness RUE Deficits / Details: Muscles Quick to fatigue BUEs            Vision Patient Visual Report: No change from baseline     Perception     Praxis      Cognition Arousal/Alertness: Awake/alert Behavior During Therapy: Flat affect Overall Cognitive Status: Impaired/Different from baseline Area of Impairment: Problem solving, Awareness, Safety/judgement, Attention, Following commands                   Current Attention Level: Selective   Following Commands: Follows one step commands with increased time Safety/Judgement: Decreased awareness of safety, Decreased awareness of deficits Awareness: Emergent Problem Solving: Requires verbal cues, Decreased initiation, Difficulty sequencing General Comments: Pt's brother and wife present during session, stepped out for peri care. Pt much more motivated to work with OT with a little encouragement. Pt does not seem to add up his deficits with his need for medical care and therapy.  Brother and wife spoke with OT in hallway and requested that therapies encourage participation as much as possible and try not to take "no" for an answer. They do acknowledge pt's right to refuse.        Exercises      Shoulder Instructions       General Comments      Pertinent Vitals/ Pain       Pain Assessment Pain Assessment: Faces Faces Pain Scale: Hurts even more Pain Location: peri area during hgyiene. RN present. Pain Descriptors / Indicators: Sore, Grimacing, Moaning Pain Intervention(s): Monitored during  session, Repositioned (Attempted to distract pt by encouragement while standing for hygiene)  Home Living                                          Prior Functioning/Environment              Frequency  Min 2X/week        Progress Toward Goals  OT Goals(current goals can now be found in the care plan section)  Progress towards OT goals: Progressing toward goals  Acute Rehab OT Goals Patient Stated Goal: Increase strength and not say "no" to PT/OT OT Goal Formulation: With family Time For Goal Achievement: 09/01/22 Potential to Achieve Goals: Good  Plan Discharge plan remains appropriate;Frequency remains appropriate    Co-evaluation                 AM-PAC OT "6 Clicks" Daily Activity     Outcome Measure   Help from another person eating meals?: A Little (needs encouragement for PO) Help from another person taking care of personal grooming?: A Little Help from another person toileting, which includes using toliet, bedpan,  or urinal?: A Lot Help from another person bathing (including washing, rinsing, drying)?: A Lot Help from another person to put on and taking off regular upper body clothing?: A Little Help from another person to put on and taking off regular lower body clothing?: A Lot 6 Click Score: 15    End of Session Equipment Utilized During Treatment: Gait belt;Rolling walker (2 wheels)  OT Visit Diagnosis: Unsteadiness on feet (R26.81);Muscle weakness (generalized) (M62.81);Other symptoms and signs involving cognitive function;Pain Pain - part of body:  (buttocks/ABD)   Activity Tolerance Patient limited by fatigue   Patient Left in bed;with call bell/phone within reach;with nursing/sitter in room   Nurse Communication Mobility status        Time: 1336-1400 OT Time Calculation (min): 24 min  Charges: OT General Charges $OT Visit: 1 Visit OT Treatments $Self Care/Home Management : 8-22 mins $Therapeutic Activity: 8-22  mins  Anderson Malta, OT Acute Rehab Services Office: [phone] 08/25/2022  Julien Girt 08/25/2022, 2:12 PM

## 2022-08-26 DIAGNOSIS — K859 Acute pancreatitis without necrosis or infection, unspecified: Secondary | ICD-10-CM | POA: Diagnosis not present

## 2022-08-26 DIAGNOSIS — K9189 Other postprocedural complications and disorders of digestive system: Secondary | ICD-10-CM | POA: Diagnosis not present

## 2022-08-26 DIAGNOSIS — K858 Other acute pancreatitis without necrosis or infection: Secondary | ICD-10-CM | POA: Diagnosis not present

## 2022-08-26 DIAGNOSIS — E43 Unspecified severe protein-calorie malnutrition: Secondary | ICD-10-CM | POA: Diagnosis not present

## 2022-08-26 LAB — CBC
HCT: 32.5 % — ABNORMAL LOW (ref 39.0–52.0)
Hemoglobin: 10.5 g/dL — ABNORMAL LOW (ref 13.0–17.0)
MCH: 29.7 pg (ref 26.0–34.0)
MCHC: 32.3 g/dL (ref 30.0–36.0)
MCV: 91.8 fL (ref 80.0–100.0)
Platelets: 311 10*3/uL (ref 150–400)
RBC: 3.54 MIL/uL — ABNORMAL LOW (ref 4.22–5.81)
RDW: 16 % — ABNORMAL HIGH (ref 11.5–15.5)
WBC: 8.6 10*3/uL (ref 4.0–10.5)
nRBC: 0 % (ref 0.0–0.2)

## 2022-08-26 LAB — GLUCOSE, CAPILLARY
Glucose-Capillary: 106 mg/dL — ABNORMAL HIGH (ref 70–99)
Glucose-Capillary: 106 mg/dL — ABNORMAL HIGH (ref 70–99)
Glucose-Capillary: 112 mg/dL — ABNORMAL HIGH (ref 70–99)
Glucose-Capillary: 162 mg/dL — ABNORMAL HIGH (ref 70–99)
Glucose-Capillary: 164 mg/dL — ABNORMAL HIGH (ref 70–99)
Glucose-Capillary: 95 mg/dL (ref 70–99)
Glucose-Capillary: 96 mg/dL (ref 70–99)

## 2022-08-26 LAB — HEPARIN LEVEL (UNFRACTIONATED): Heparin Unfractionated: 0.62 IU/mL (ref 0.30–0.70)

## 2022-08-26 MED ORDER — SODIUM CHLORIDE 0.9% FLUSH
5.0000 mL | Freq: Three times a day (TID) | INTRAVENOUS | Status: DC
Start: 2022-08-26 — End: 2022-08-26
  Administered 2022-08-26: 5 mL

## 2022-08-26 MED ORDER — OXYCODONE HCL 5 MG PO TABS
10.0000 mg | ORAL_TABLET | Freq: Four times a day (QID) | ORAL | Status: DC | PRN
  Administered 2022-08-26 – 2022-09-06 (×11): 10 mg via ORAL
  Filled 2022-08-26 (×11): qty 2

## 2022-08-26 NOTE — Progress Notes (Signed)
ANTICOAGULATION CONSULT NOTE - Follow Up Consult  Pharmacy Consult for Heparin Indication: atrial fibrillation  No Known Allergies  Patient Measurements: Height: '6\' 1"'$  (185.4 cm) Weight: 80.2 kg (176 lb 12.9 oz) IBW/kg (Calculated) : 79.9 Heparin Dosing Weight: 80 kg  Vital Signs: Temp: 97.9 F (36.6 C) (08/30 0802) Temp Source: Oral (08/30 0802) BP: 108/53 (08/30 0400)  Labs: Recent Labs    08/24/22 0319 08/24/22 1323 08/25/22 0250 08/26/22 0821  HGB 10.6*  --  10.6* 10.5*  HCT 32.1*  --  31.7* 32.5*  PLT 347  --  351 311  HEPARINUNFRC 0.76* 0.59 0.43 0.62  CREATININE  --   --  1.07  --      Estimated Creatinine Clearance: 63.3 mL/min (by C-G formula based on SCr of 1.07 mg/dL).  [name], no bolus per MD. Last dose of Eliquis was on 07/26/22 PTA.   Heparin level remains therapeutic (0.62) on 1450 units/hr. CBC stable.  Goal of Therapy:  Heparin level 0.3-0.7 units/ml Monitor platelets by anticoagulation protocol: Yes   Plan:  Continue heparin drip at 1450 units/hr Daily heparin level and CBC. PTA Eliquis on hold. Follow up for transition back to Eliquis when tolerating PO and no further procedures planned.   Arty Baumgartner, RPh 08/26/2022,11:32 AM

## 2022-08-26 NOTE — NC FL2 (Signed)
[place] LEVEL OF CARE SCREENING TOOL     IDENTIFICATION  Patient Name: Douglas Hurst. Birthdate: [birth year] Sex: male Admission Date (Current Location): 07/30/2022  [facility] and [place] Number:  Herbalist and Address:  [facility], [place] [address] [place], [place] [address]      Provider Number: [id number]  Attending Physician Name and Address:  Jonetta Osgood, MD  Relative Name and Phone Number:       Current Level of Care: Hospital Recommended Level of Care: [place] Prior Approval Number:    Date Approved/Denied:   PASRR Number: pending  Discharge Plan: SNF    Current Diagnoses: Patient Active Problem List   Diagnosis Date Noted   Protein-calorie malnutrition, severe 08/12/2022   Acute pancreatitis 07/31/2022   Choledocholithiasis 07/08/2022   Hyperbilirubinemia 04/30/2022   Hypoxia 04/29/2022   Normocytic anemia 80/99/8338   Acute systolic heart failure (HCC)    PAF (paroxysmal atrial fibrillation) ([place])    Acute calculous cholecystitis 04/26/2022   Abdominal aortic aneurysm ([place]) 04/25/2022   Bipolar disorder ([place]) 04/25/2022   Chronic obstructive pulmonary disease ([place]) 04/25/2022   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[place]) 04/25/2022   Gastroesophageal reflux disease 04/25/2022   Essential hypertension 04/25/2022   Mixed hyperlipidemia 04/25/2022   sepsis secondary to calculus of bile duct (any) with acute cholecystitis 04/25/2022   Sepsis ([place]) 04/25/2022    Orientation RESPIRATION BLADDER Height & Weight     Self, Time, Situation, Place  Normal Incontinent, External catheter Weight: 176 lb 12.9 oz (80.2 kg) Height:  '6\' 1"'$  (185.4 cm)  BEHAVIORAL SYMPTOMS/MOOD NEUROLOGICAL BOWEL NUTRITION STATUS      Incontinent Diet (See dc summary)  AMBULATORY STATUS COMMUNICATION OF NEEDS Skin   Limited Assist Verbally PU Stage and Appropriate Care,  Surgical wounds (Stage II on buttocks; closed incision on abdomen; puncture on abdomen)                       Personal Care Assistance Level of Assistance  Bathing, Feeding, Dressing Bathing Assistance: Limited assistance Feeding assistance: Independent Dressing Assistance: Limited assistance     Functional Limitations Info  Sight, Hearing Sight Info: Impaired Hearing Info: Impaired      SPECIAL CARE FACTORS FREQUENCY  PT (By licensed PT), OT (By licensed OT)     PT Frequency: 5x/week OT Frequency: 5x/week            Contractures Contractures Info: Not present    Additional Factors Info  Code Status, Allergies, Insulin Sliding Scale Code Status Info: Full Allergies Info: NKA   Insulin Sliding Scale Info: See dc summary       Current Medications (08/26/2022):  This is the current hospital active medication list Current Facility-Administered Medications  Medication Dose Route Frequency Provider Last Rate Last Admin   acetaminophen (TYLENOL) tablet 1,000 mg  1,000 mg Oral TID Philis Pique, NP   1,000 mg at 08/26/22 2505   amiodarone (PACERONE) tablet 200 mg  200 mg Oral Daily Little Ishikawa, MD   200 mg at 08/26/22 0904   feeding supplement (ENSURE ENLIVE / ENSURE PLUS) liquid 237 mL  237 mL Oral BID BM Charlynne Cousins, MD   237 mL at 08/25/22 1528   feeding supplement (PROSource TF20) liquid 60 mL  60 mL Per Tube TID Patrecia Pour, MD   60 mL at 08/26/22 0904   fentaNYL (  DURAGESIC) 12 MCG/HR 1 patch  1 patch Transdermal Q72H Vance Gather B, MD   1 patch at 08/25/22 1547   heparin ADULT infusion 100 units/mL (25000 units/255m)  1,450 Units/hr Intravenous Continuous LErenest Blank RPH 14.5 mL/hr at 08/26/22 0600 1,450 Units/hr at 08/26/22 0600   HYDROcodone-acetaminophen (NORCO/VICODIN) 5-325 MG per tablet 1 tablet  1 tablet Oral Q6H PRN LLittle Ishikawa MD   1 tablet at 08/26/22 1102   insulin aspart (novoLOG) injection 0-9 Units  0-9 Units  Subcutaneous Q4H GPatrecia Pour MD   2 Units at 08/25/22 1605   insulin detemir (LEVEMIR) injection 15 Units  15 Units Subcutaneous BID FCharlynne Cousins MD   15 Units at 08/26/22 0905   metoprolol tartrate (LOPRESSOR) tablet 25 mg  25 mg Oral BID GVance GatherB, MD   25 mg at 08/26/22 0905   ondansetron (ZOFRAN) tablet 4 mg  4 mg Oral Q6H PRN TClance Boll MD       Or   ondansetron ([facility] injection 4 mg  4 mg Intravenous Q6H PRN TClance Boll MD       polyethylene glycol (MIRALAX / GLYCOLAX) packet 17 g  17 g Oral BID FCharlynne Cousins MD   17 g at 08/24/22 2116   senna-docusate (Senokot-S) tablet 1 tablet  1 tablet Oral Daily GVance GatherB, MD   1 tablet at 08/24/22 1042   sodium chloride flush (NS) 0.9 % injection 5 mL  5 mL Intracatheter Q8H WCorrie Mckusick DO   5 mL at 08/26/22 0625   tamsulosin (FLOMAX) capsule 0.8 mg  0.8 mg Oral Daily LLittle Ishikawa MD   0.8 mg at 08/26/22 04656  umeclidinium bromide (INCRUSE ELLIPTA) 62.5 MCG/ACT 1 puff  1 puff Inhalation Daily PRN PTheotis Burrow [facility]        Discharge Medications: Please see discharge summary for a list of discharge medications.  Relevant Imaging Results:  Relevant Lab Results:   Additional Information SSN: [id number] NToronto LCSW

## 2022-08-26 NOTE — Progress Notes (Signed)
Referring Physician(s): Dr. Aileen Fass  Supervising Physician: Sandi Mariscal  Patient Status:  Douglas Hurst - In-pt  Chief Complaint: Right hepatic abscess s/p drain placement 8/15 Drain exchange and upsize to 12 Fr on 8/24   Subjective: Patient awake in bed, wife at the bedside. He has been eating a little bit of food today. He endorses some mild RUQ tenderness.   Allergies: Patient has no known allergies.  Medications: Prior to Admission medications   Medication Sig Start Date End Date Taking? Authorizing Provider  acetaminophen (TYLENOL) 325 MG tablet Take 2 tablets (650 mg total) by mouth every 6 (six) hours as needed for mild pain (or Fever >/= 101). 04/30/22  Yes Sheikh, Omair Latif, DO  amiodarone (PACERONE) 200 MG tablet Take 1 tablet (200 mg total) by mouth daily. 05/29/22  Yes Loel Dubonnet, NP  atorvastatin (LIPITOR) 40 MG tablet Take 40 mg by mouth every evening.   Yes [provider]  lisinopril-hydrochlorothiazide (ZESTORETIC) 10-12.5 MG tablet Take 0.5 tablets by mouth daily.   Yes [provider]  metFORMIN (GLUCOPHAGE-XR) 500 MG 24 hr tablet Take 500-1,000 mg by mouth See admin instructions. Take 2 tablets (1000 mg) by mouth in the morning and 1 tablet (500 mg) every evening 03/17/22  Yes [provider]  metoprolol tartrate (LOPRESSOR) 25 MG tablet Take 1 tablet (25 mg total) by mouth 2 (two) times daily. 05/29/22  Yes Loel Dubonnet, NP  Multiple Vitamin (MULTIVITAMIN WITH MINERALS) TABS tablet Take 1 tablet by mouth daily.   Yes [provider]  ondansetron (ZOFRAN) 4 MG tablet Take 1 tablet (4 mg total) by mouth every 6 (six) hours as needed for nausea. 04/30/22  Yes Sheikh, Omair Latif, DO  pantoprazole (PROTONIX) 40 MG tablet Take 40 mg by mouth every morning.   Yes [provider]  polyethylene glycol powder (GLYCOLAX/MIRALAX) 17 GM/SCOOP powder Take 17 g by mouth daily. Patient taking differently: Take 17 g by mouth daily as  needed for mild constipation or moderate constipation. 04/30/22  Yes Sheikh, Omair Latif, DO  senna-docusate (SENOKOT-S) 8.6-50 MG tablet Take 1 tablet by mouth at bedtime. Patient taking differently: Take 1 tablet by mouth at bedtime as needed for mild constipation or moderate constipation. 04/30/22  Yes Sheikh, Omair Latif, DO  SPIRIVA RESPIMAT 2.5 MCG/ACT AERS Inhale 2 puffs into the lungs daily as needed (COPD). 06/04/22  Yes [provider]  tadalafil (CIALIS) 20 MG tablet Take 20 mg by mouth every three (3) days as needed for erectile dysfunction. 03/29/22  Yes [provider]  tamsulosin (FLOMAX) 0.4 MG CAPS capsule Take 0.4 mg by mouth daily after supper. 05/13/22  Yes [provider]  apixaban (ELIQUIS) 5 MG TABS tablet Take 1 tablet (5 mg total) by mouth 2 (two) times daily. Patient not taking: Reported on 07/31/2022 05/29/22   Loel Dubonnet, NP  oxyCODONE-acetaminophen (PERCOCET) 5-325 MG tablet Take 1 tablet by mouth every 4 (four) hours as needed for severe pain. Patient not taking: Reported on 07/31/2022 07/14/22 07/14/23  Norm Parcel, PA-C     Vital Signs: BP (!) 111/52   Pulse 72   Temp 97.7 F (36.5 C) (Oral)   Resp 13   Ht '6\' 1"'$  (1.854 m)   Wt 176 lb 12.9 oz (80.2 kg)   SpO2 100%   BMI 23.33 kg/m   Physical Exam Constitutional:      General: He is not in acute distress. Pulmonary:     Effort:  Pulmonary effort is normal.  Abdominal:     Palpations: Abdomen is soft.     Tenderness: There is abdominal tenderness.     Comments: RUQ drain to suction. Approximately 40 ml of thin brown bile in bulb. Drain flushed with some resistance.  Neurological:     Mental Status: He is alert.     Imaging: No results found.  Labs:  CBC: Recent Labs    08/23/22 0317 08/24/22 0319 08/25/22 0250 08/26/22 0821  WBC 11.6* 11.6* 10.4 8.6  HGB 10.4* 10.6* 10.6* 10.5*  HCT 32.1* 32.1* 31.7* 32.5*  PLT 339 347 351 311    COAGS: Recent Labs     04/26/22 0843 08/01/22 1832 08/02/22 0703 08/11/22 0941  INR 1.3*  --   --  1.1  APTT  --  121* 73*  --     BMP: Recent Labs    08/20/22 0354 08/21/22 0334 08/23/22 0317 08/25/22 0250  NA 129* 131* 135 137  K 4.3 4.6 4.6 4.5  CL 93* 99 101 103  CO2 '29 28 26 27  '$ GLUCOSE 249* 233* 164* 88  BUN 45* 36* 32* 25*  CALCIUM 9.6 9.2 9.5 9.9  CREATININE 1.14 1.02 1.00 1.07  GFRNONAA >60 >60 >60 >60    LIVER FUNCTION TESTS: Recent Labs    08/01/22 0717 08/02/22 0703 08/03/22 0819 08/10/22 0747  BILITOT 1.1 1.0 1.0 0.7  AST '22 19 18 17  '$ ALT '23 20 17 16  '$ ALKPHOS 53 72 64 73  PROT 5.2* 5.5* 5.0* 5.4*  ALBUMIN 2.4* 2.3* 2.0* 1.8*    Assessment and Plan:  Right hepatic abscess s/p drain placement 8/15 Drain exchange and upsize to 12 Fr on 8/24   Drain Location: Perihepatic Size: Fr size: 12 Fr Date of placement: 08/11/22  Currently to: Drain collection device: gravity 24 hour output:  Output by Drain (mL) 08/24/22 0700 - 08/24/22 1459 08/24/22 1500 - 08/24/22 2259 08/24/22 2300 - 08/25/22 0659 08/25/22 0700 - 08/25/22 1459 08/25/22 1500 - 08/25/22 2259 08/25/22 2300 - 08/26/22 0659 08/26/22 0700 - 08/26/22 1459 08/26/22 1500 - 08/26/22 1542  Closed System Drain 1 Lateral;Right Abdomen 12 Fr. 80   50        Interval imaging/drain manipulation:  Drain exchange/upsize 08/20/22 to a 12 Fr  Current examination: Flushes/aspirates easily.  Insertion site unremarkable. Suture and stat lock in place. Dressed appropriately.   Plan: Continue TID flushes with 5 cc NS. Record output Q shift. Dressing changes QD or PRN if soiled.  Call IR APP or on call IR MD if difficulty flushing or sudden change in drain output.  Repeat imaging/possible drain injection once output < 10 mL/QD (excluding flush material). Consideration for drain removal if output is < 10 mL/QD (excluding flush material), pending discussion with the providing surgical service.  Discharge planning: Please  contact IR APP or on call IR MD prior to patient d/c to ensure appropriate follow up plans are in place. Typically patient will follow up with IR clinic 10-14 days post d/c for repeat imaging/possible drain injection. IR scheduler will contact patient with date/time of appointment. Patient will need to flush drain QD with 5 cc NS, record output QD, dressing changes every 2-3 days or earlier if soiled.   IR will continue to follow - please call with questions or concerns.    Electronically Signed: Soyla Dryer, AGACNP-BC [phone] 08/26/2022, 3:40 PM   I spent a total of 15 Minutes at the the patient's bedside AND on the  patient's hospital floor or unit, greater than 50% of which was counseling/coordinating care for perihepatic abscess drain

## 2022-08-26 NOTE — Progress Notes (Signed)
Subjective: Patient examined in presence of his wife and brother. As per family members he has been finishing all his meals from home. As per his nurse, he is finishing about 60% of food offered at the hospital. NG tube nocturnal feeds seem to be on hold for the last few nights. Patient reports having a large bowel movement yesterday. Continues to have abdominal pain about 6-7 out of 10 in intensity. Denies being pain-free.  Objective: Vital signs in last 24 hours: Temp:  [97.8 F (36.6 C)-98 F (36.7 C)] 97.9 F (36.6 C) (08/30 0802) Pulse Rate:  [70-72] 72 (08/29 2000) Resp:  [10-20] 10 (08/30 0400) BP: (104-115)/(50-56) 108/53 (08/30 0400) SpO2:  [98 %-100 %] 100 % (08/30 0400) Weight change:  Last BM Date : 08/25/22  PE: Elderly, thinly built GENERAL: Mild pallor without icterus  ABDOMEN: Soft, nontender, nondistended, IR placed drain noted in right upper quadrant draining dark but bilious fluid EXTREMITIES: No deformity  Lab Results: Results for orders placed or performed during the hospital encounter of 07/30/22 (from the past 48 hour(s))  Glucose, capillary     Status: Abnormal   Collection Time: 08/24/22 12:14 PM  Result Value Ref Range   Glucose-Capillary 152 (H) 70 - 99 mg/dL    Comment: Glucose reference range applies only to samples taken after fasting for at least 8 hours.  Heparin level (unfractionated)     Status: None   Collection Time: 08/24/22  1:23 PM  Result Value Ref Range   Heparin Unfractionated 0.59 0.30 - 0.70 IU/mL    Comment: (NOTE) The clinical reportable range upper limit is being lowered to >1.10 to align with the FDA approved guidance for the current laboratory assay.  If heparin results are below expected values, and patient dosage has  been confirmed, suggest follow up testing of antithrombin III levels. Performed at [facility], [place] [address], [place], [place] [address]   Glucose, capillary     Status: Abnormal   Collection  Time: 08/24/22  3:34 PM  Result Value Ref Range   Glucose-Capillary 133 (H) 70 - 99 mg/dL    Comment: Glucose reference range applies only to samples taken after fasting for at least 8 hours.  Glucose, capillary     Status: Abnormal   Collection Time: 08/24/22  9:14 PM  Result Value Ref Range   Glucose-Capillary 103 (H) 70 - 99 mg/dL    Comment: Glucose reference range applies only to samples taken after fasting for at least 8 hours.  Heparin level (unfractionated)     Status: None   Collection Time: 08/25/22  2:50 AM  Result Value Ref Range   Heparin Unfractionated 0.43 0.30 - 0.70 IU/mL    Comment: (NOTE) The clinical reportable range upper limit is being lowered to >1.10 to align with the FDA approved guidance for the current laboratory assay.  If heparin results are below expected values, and patient dosage has  been confirmed, suggest follow up testing of antithrombin III levels. Performed at [facility], [place] [address], [place], [place] [address]   CBC     Status: Abnormal   Collection Time: 08/25/22  2:50 AM  Result Value Ref Range   WBC 10.4 4.0 - 10.5 K/uL   RBC 3.54 (L) 4.22 - 5.81 MIL/uL   Hemoglobin 10.6 (L) 13.0 - 17.0 g/dL   HCT 31.7 (L) 39.0 - 52.0 %   MCV 89.5 80.0 - 100.0 fL   MCH 29.9 26.0 - 34.0 pg  MCHC 33.4 30.0 - 36.0 g/dL   RDW 15.9 (H) 11.5 - 15.5 %   Platelets 351 150 - 400 K/uL   nRBC 0.0 0.0 - 0.2 %    Comment: Performed at [facility], [place] [address] [place], [place] [address]  Basic metabolic panel     Status: Abnormal   Collection Time: 08/25/22  2:50 AM  Result Value Ref Range   Sodium 137 135 - 145 mmol/L   Potassium 4.5 3.5 - 5.1 mmol/L   Chloride 103 98 - 111 mmol/L   CO2 27 22 - 32 mmol/L   Glucose, Bld 88 70 - 99 mg/dL    Comment: Glucose reference range applies only to samples taken after fasting for at least 8 hours.   BUN 25 (H) 8 - 23 mg/dL   Creatinine, Ser 1.07 0.61 - 1.24 mg/dL   Calcium 9.9 8.9 - 10.3 mg/dL    GFR, Estimated >60 >60 mL/min    Comment: (NOTE) Calculated using the CKD-EPI Creatinine Equation (2021)    Anion gap 7 5 - 15    Comment: Performed at [place] [address], [place], [place] [address]  Glucose, capillary     Status: Abnormal   Collection Time: 08/25/22  8:16 AM  Result Value Ref Range   Glucose-Capillary 110 (H) 70 - 99 mg/dL    Comment: Glucose reference range applies only to samples taken after fasting for at least 8 hours.  Glucose, capillary     Status: Abnormal   Collection Time: 08/25/22 12:57 PM  Result Value Ref Range   Glucose-Capillary 215 (H) 70 - 99 mg/dL    Comment: Glucose reference range applies only to samples taken after fasting for at least 8 hours.  Glucose, capillary     Status: Abnormal   Collection Time: 08/25/22  3:57 PM  Result Value Ref Range   Glucose-Capillary 155 (H) 70 - 99 mg/dL    Comment: Glucose reference range applies only to samples taken after fasting for at least 8 hours.  Glucose, capillary     Status: Abnormal   Collection Time: 08/25/22  8:26 PM  Result Value Ref Range   Glucose-Capillary 105 (H) 70 - 99 mg/dL    Comment: Glucose reference range applies only to samples taken after fasting for at least 8 hours.  Glucose, capillary     Status: Abnormal   Collection Time: 08/25/22 11:59 PM  Result Value Ref Range   Glucose-Capillary 106 (H) 70 - 99 mg/dL    Comment: Glucose reference range applies only to samples taken after fasting for at least 8 hours.  Glucose, capillary     Status: Abnormal   Collection Time: 08/26/22  3:43 AM  Result Value Ref Range   Glucose-Capillary 106 (H) 70 - 99 mg/dL    Comment: Glucose reference range applies only to samples taken after fasting for at least 8 hours.  Glucose, capillary     Status: Abnormal   Collection Time: 08/26/22  7:58 AM  Result Value Ref Range   Glucose-Capillary 112 (H) 70 - 99 mg/dL    Comment: Glucose reference range applies only to samples taken after  fasting for at least 8 hours.  Heparin level (unfractionated)     Status: None   Collection Time: 08/26/22  8:21 AM  Result Value Ref Range   Heparin Unfractionated 0.62 0.30 - 0.70 IU/mL    Comment: (NOTE) The clinical reportable range upper limit is being lowered to >1.10  to align with the FDA approved guidance for the current laboratory assay.  If heparin results are below expected values, and patient dosage has  been confirmed, suggest follow up testing of antithrombin III levels. Performed at [facility], [place] [address], [place], [place] [address]   CBC     Status: Abnormal   Collection Time: 08/26/22  8:21 AM  Result Value Ref Range   WBC 8.6 4.0 - 10.5 K/uL   RBC 3.54 (L) 4.22 - 5.81 MIL/uL   Hemoglobin 10.5 (L) 13.0 - 17.0 g/dL   HCT 32.5 (L) 39.0 - 52.0 %   MCV 91.8 80.0 - 100.0 fL   MCH 29.7 26.0 - 34.0 pg   MCHC 32.3 30.0 - 36.0 g/dL   RDW 16.0 (H) 11.5 - 15.5 %   Platelets 311 150 - 400 K/uL   nRBC 0.0 0.0 - 0.2 %    Comment: Performed at [facility], [place] [address], [place], [place] [address]    Studies/Results: No results found.  Medications: I have reviewed the patient's current medications.  Assessment: Pancreatitis and pancreatic pseudocyst, complicated ERCP Subhepatic space collection, status post IR guided drain placement, 50 cc collection in the last 24 hours  Normal WBC, normocytic anemia Normal renal function with low albumin of 1.8 and low total protein of 5.4  Plan: Discussed with patient's hospitalist Dr. Sloan Leiter.Okay to remove nasoenteric tube as patient seems to be able to tolerate oral diet and maintain nutrition.  Family members requesting for inpatient rehab and physical therapy.  Currently on fentanyl 12 mcg/h patch every 72 hours and hydrocodone/acetaminophen 1 tablet every 6 hours as needed for pain control.  Continue MiraLAX 17 g twice a day and senna 1 tablet daily.  Ronnette Juniper, MD 08/26/2022, 10:54 AM

## 2022-08-26 NOTE — TOC Initial Note (Addendum)
Transition of Care (TOC) - Initial/Assessment Note    Patient Details  Name: Douglas Hurst. MRN: [id number] Date of Birth: 1941-06-21  Transition of Care [facility]) CM/SW Contact:    Barton Fanny, [place] Work Phone Number: 08/26/2022, 4:50 PM  Clinical Narrative:                 MSW intern spoke with patient's wife, Jamas Lav to discuss SNF bed offers of [facility], [place], and [place]. Jamas Lav advised she wanted to speak with patient's brother and then make a decision tomorrow. Jamas Lav asked that CSW come and speak with patient and family face to face tomorrow to help make final decision.    CSW submitted clinicals to pasrr for review.  Expected Discharge Plan: IP Rehab Facility Barriers to Discharge: Ship broker, Continued Medical Work up (CIR vs. snf)   Patient Goals and CMS Choice Patient states their goals for this hospitalization and ongoing recovery are:: Rehab CMS Medicare.gov Compare Post Acute Care list provided to:: Patient Choice offered to / list presented to : Patient, Spouse  Expected Discharge Plan and Services Expected Discharge Plan: [place] In-house Referral: Clinical Social Work   Post Acute Care Choice: IP Rehab Living arrangements for the past 2 months: Single Family Home                                      Prior Living Arrangements/Services Living arrangements for the past 2 months: Single Family Home Lives with:: Spouse Patient language and need for interpreter reviewed:: Yes Do you feel safe going back to the place where you live?: Yes      Need for Family Participation in Patient Care: No (Comment) Care giver support system in place?: Yes (comment)   Criminal Activity/Legal Involvement Pertinent to Current Situation/Hospitalization: No - Comment as needed  Activities of Daily Living      Permission Sought/Granted Permission sought to share information with : Facility Sport and exercise psychologist,  Family Supports Permission granted to share information with : Yes, Verbal Permission Granted  Share Information with NAME: Jamas Lav  Permission granted to share info w AGENCY: SNFs  Permission granted to share info w Relationship: Spouse  Permission granted to share info w Contact Information: [phone]  Emotional Assessment Appearance:: Appears stated age Attitude/Demeanor/Rapport: Engaged Affect (typically observed): Accepting, Appropriate Orientation: : Oriented to Self, Oriented to Place, Oriented to  Time, Oriented to Situation Alcohol / Substance Use: Not Applicable Psych Involvement: No (comment)  Admission diagnosis:  Acute pancreatitis [K85.90] Post-ERCP acute pancreatitis [K91.89, K85.90] Patient Active Problem List   Diagnosis Date Noted   Protein-calorie malnutrition, severe 08/12/2022   Acute pancreatitis 07/31/2022   Choledocholithiasis 07/08/2022   Hyperbilirubinemia 04/30/2022   Hypoxia 04/29/2022   Normocytic anemia 75/09/2584   Acute systolic heart failure (HCC)    PAF (paroxysmal atrial fibrillation) (HCC)    Acute calculous cholecystitis 04/26/2022   Abdominal aortic aneurysm ([place]) 04/25/2022   Bipolar disorder ([place]) 04/25/2022   Chronic obstructive pulmonary disease ([place]) 04/25/2022   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[place]) 04/25/2022   Gastroesophageal reflux disease 04/25/2022   Essential hypertension 04/25/2022   Mixed hyperlipidemia 04/25/2022   sepsis secondary to calculus of bile duct (any) with acute cholecystitis 04/25/2022   Sepsis ([place]) 04/25/2022   PCP:  Lavone Orn, MD Pharmacy:   [id number] [place]  [address] [place] [address] Phone: [phone] Fax: [phone]     Social Determinants of Health (SDOH) Interventions    Readmission Risk Interventions     No data to display

## 2022-08-26 NOTE — Progress Notes (Addendum)
PROGRESS NOTE        PATIENT DETAILS Name: Douglas Hurst. Age: 81 y.o. Sex: male Date of Birth: [birth year] Admit Date: 07/30/2022 Admitting Physician Clance Boll, MD FBP:ZWCHENI, [name], MD  Brief Summary: Patient is a 81 y.o.  male with history of PAF on Eliquis, HTN, HLD, COPD, chronic HFrEF (recovered EF)-with recent history of cholecystitis-s/p cholecystectomy but with retained CBD stone.  ERCP here was unsuccessful.  Patient was referred to [facility] underwent advanced endoscopy via transduodenal approach for ERCP on 8/3 at [facility] with Dr. Okey Dupre -he subsequently presented to the ED with severe abdominal pain-was found to have pancreatitis and admitted to the hospitalist service.  Course was complicated by A-fib RVR, pseudocyst formation, subhepatic fluid collection requiring CT-guided drain placement.    Significant events: 8/3>> admit to [facility]-abdominal pain after ERCP at [facility]-found to have acute pancreatitis 8/5>> A-fib RVR-no response to IV Lopressor-blood pressure soft-amiodarone infusion started.  After discussion with GI-heparin started without bolus.  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 8/5>> CT abdomen/pelvis: Resolution of hypoenhancement of the pancreas-persistent inflammatory changes within the retroperitoneum surrounding the head of the pancreas extending into the porta hepatis.  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 8/13>> CT abdomen/pelvis: Increased size of large perihepatic pseudocysts 8/23>> CT abdomen/pelvis: Interval decrease in loculated perihepatic fluid collection after percutaneous drain placement.  Significant microbiology data: 8/15>> perihepatic  fluid culture: No growth  Procedures: 8/15>> CT-guided drain placement right subcapsular fluid collection. 8/16>>Cortak placed (removed 8/30) 8/24>> s/p exchange and size up of perihepatic drain.  Consults: GI Phone consult-cards-Dr Crenshaw IR Palliative care  Subjective: No major issues-brother at bedside-eating well-wants NG tube removed.  Objective: Vitals: Blood pressure (!) 108/53, pulse 72, temperature 97.9 F (36.6 C), temperature source Oral, resp. rate 10, height '6\' 1"'$  (1.854 m), weight 80.2 kg, SpO2 100 %.   Exam: Gen Exam:Alert awake-not in any distress HEENT:atraumatic, normocephalic Chest: B/L clear to auscultation anteriorly CVS:S1S2 regular Abdomen:soft non tender, non distended Extremities:no edema Neurology: Non focal Skin: no rash   Pertinent Labs/Radiology:    Latest Ref Rng & Units 08/26/2022    8:21 AM 08/25/2022    2:50 AM 08/24/2022    3:19 AM  CBC  WBC 4.0 - 10.5 K/uL 8.6  10.4  11.6   Hemoglobin 13.0 - 17.0 g/dL 10.5  10.6  10.6   Hematocrit 39.0 - 52.0 % 32.5  31.7  32.1   Platelets 150 - 400 K/uL 311  351  347     Lab Results  Component Value Date   NA 137 08/25/2022   K 4.5 08/25/2022   CL 103 08/25/2022   CO2 27 08/25/2022      Assessment/Plan: Acute post ERCP pancreatitis with pseudocyst formation: Improving-NG tube to be removed today-diet being advanced which she is tolerating.  Subhepatic space fluid collection: Likely due to ERCP done via trans duodenal approach.  Drain in place-IR following.  PAF with RVR: Due to acute illness-maintaining sinus rhythm-on amiodarone-IV heparin to transition to oral DOAC over the next few days.   Acute hypoxic respiratory failure: Due to pleural effusion/atelectasis-improved-on room air.  Continue mobilization.  Encourage incentive spirometry.  AKI: Resolved-likely hemodynamically  mediated.  Acute on chronic HFrEF with recovered EF: Volume status stable-no longer on  furosemide.  Normocytic anemia: Due to critical/acute illness-no evidence of blood loss.  Follow CBC periodically.  DM-2: CBGs stable Levemir 15 units twice daily and SSI.  Recent Labs    08/26/22 0343 08/26/22 0758 08/26/22 1121  GLUCAP 106* 112* 162*     HTN: BP soft-continue to hold all antihypertensives.  BPH: Continue Flomax  COPD: Continue bronchodilators  Infrarenal abdominal aortic aneurysm 4 mm: Unchanged from prior study-annual follow-up is required.  Debility/deconditioning: Due to acute illness-SNF planned on discharge.  Nutrition Status: Nutrition Problem: Severe Malnutrition Etiology: chronic illness (COPD, HF, acute pancreatitis) Signs/Symptoms: severe fat depletion, moderate fat depletion, severe muscle depletion, moderate muscle depletion Interventions: Tube feeding, Liberalize Diet    BMI: Estimated body mass index is 23.33 kg/m as calculated from the following:   Height as of this encounter: '6\' 1"'$  (1.854 m).   Weight as of this encounter: 80.2 kg.   Code status:   Code Status: Full Code   DVT Prophylaxis:IV heparin    Family Communication: Brother at bedside   Disposition Plan: Status is: Inpatient Remains inpatient appropriate because: Less-NG tube being removed today-diet being advanced-eventually to SNF over the next few days.  Planned Discharge Destination:SNF   Diet: Diet Order             DIET DYS 3 Room service appropriate? Yes; Fluid consistency: Thin  Diet effective now                     Antimicrobial agents: Anti-infectives (From admission, onward)    Start     Dose/Rate Route Frequency Ordered Stop   08/02/22 1600  piperacillin-tazobactam (ZOSYN) IVPB 3.375 g  Status:  Discontinued        3.375 g 12.5 mL/hr over 240 Minutes Intravenous Every 8 hours 08/02/22 1039 08/13/22 1505   08/02/22 1130  piperacillin-tazobactam (ZOSYN) IVPB 4.5 g        4.5 g 200 mL/hr over 30 Minutes Intravenous  Once 08/02/22 1039  08/02/22 1240   07/31/22 0400  meropenem (MERREM) 1 g in sodium chloride 0.9 % 100 mL IVPB  Status:  Discontinued        1 g 200 mL/hr over 30 Minutes Intravenous Every 8 hours 07/31/22 0252 08/02/22 1039   07/30/22 2245  piperacillin-tazobactam (ZOSYN) IVPB 3.375 g  Status:  Discontinued        3.375 g 12.5 mL/hr over 240 Minutes Intravenous Every 8 hours 07/30/22 2239 07/31/22 0239        MEDICATIONS: Scheduled Meds:  acetaminophen  1,000 mg Oral TID   amiodarone  200 mg Oral Daily   feeding supplement  237 mL Oral BID BM   feeding supplement (PROSource TF20)  60 mL Per Tube TID   fentaNYL  1 patch Transdermal Q72H   insulin aspart  0-9 Units Subcutaneous Q4H   insulin detemir  15 Units Subcutaneous BID   metoprolol tartrate  25 mg Oral BID   polyethylene glycol  17 g Oral BID   senna-docusate  1 tablet Oral Daily   sodium chloride flush  5 mL Intracatheter Q8H   tamsulosin  0.8 mg Oral Daily   Continuous Infusions:  heparin 1,450 Units/hr (08/26/22 0600)   PRN Meds:.HYDROcodone-acetaminophen, ondansetron **OR** ondansetron (ZOFRAN) IV, umeclidinium bromide   I have personally reviewed following labs and imaging studies  LABORATORY DATA: CBC: Recent Labs  Lab 08/20/22 1126 08/21/22 0334 08/22/22  4944 08/23/22 0317 08/24/22 0319 08/25/22 0250 08/26/22 0821  WBC 12.4*   < > 10.1 11.6* 11.6* 10.4 8.6  NEUTROABS 9.2*  --   --   --   --   --   --   HGB 10.9*   < > 10.0* 10.4* 10.6* 10.6* 10.5*  HCT 33.2*   < > 31.2* 32.1* 32.1* 31.7* 32.5*  MCV 89.5   < > 90.7 90.9 89.4 89.5 91.8  PLT 423*   < > 333 339 347 351 311   < > = values in this interval not displayed.     Basic Metabolic Panel: Recent Labs  Lab 08/20/22 0354 08/21/22 0334 08/23/22 0317 08/25/22 0250  NA 129* 131* 135 137  K 4.3 4.6 4.6 4.5  CL 93* 99 101 103  CO2 '29 28 26 27  '$ GLUCOSE 249* 233* 164* 88  BUN 45* 36* 32* 25*  CREATININE 1.14 1.02 1.00 1.07  CALCIUM 9.6 9.2 9.5 9.9      GFR: Estimated Creatinine Clearance: 63.3 mL/min (by C-G formula based on SCr of 1.07 mg/dL).  Liver Function Tests: No results for input(s): "AST", "ALT", "ALKPHOS", "BILITOT", "PROT", "ALBUMIN" in the last 168 hours.  No results for input(s): "LIPASE", "AMYLASE" in the last 168 hours.  No results for input(s): "AMMONIA" in the last 168 hours.  Coagulation Profile: No results for input(s): "INR", "PROTIME" in the last 168 hours.  Cardiac Enzymes: No results for input(s): "CKTOTAL", "CKMB", "CKMBINDEX", "TROPONINI" in the last 168 hours.  BNP (last 3 results) No results for input(s): "PROBNP" in the last 8760 hours.  Lipid Profile: No results for input(s): "CHOL", "HDL", "LDLCALC", "TRIG", "CHOLHDL", "LDLDIRECT" in the last 72 hours.  Thyroid Function Tests: No results for input(s): "TSH", "T4TOTAL", "FREET4", "T3FREE", "THYROIDAB" in the last 72 hours.  Anemia Panel: No results for input(s): "VITAMINB12", "FOLATE", "FERRITIN", "TIBC", "IRON", "RETICCTPCT" in the last 72 hours.  Urine analysis:    Component Value Date/Time   COLORURINE YELLOW 08/18/2022 1011   APPEARANCEUR CLOUDY (A) 08/18/2022 1011   LABSPEC 1.013 08/18/2022 1011   PHURINE 9.0 (H) 08/18/2022 1011   GLUCOSEU NEGATIVE 08/18/2022 [place] 08/18/2022 [place] 08/18/2022 1011   KETONESUR NEGATIVE 08/18/2022 1011   PROTEINUR NEGATIVE 08/18/2022 1011   UROBILINOGEN 4.0 (H) 10/02/2015 2135   NITRITE NEGATIVE 08/18/2022 1011   LEUKOCYTESUR NEGATIVE 08/18/2022 1011    Sepsis Labs: Lactic Acid, Venous    Component Value Date/Time   LATICACIDVEN 6.2 (HH) 07/31/2022 0528    MICROBIOLOGY: No results found for this or any previous visit (from the past 240 hour(s)).  RADIOLOGY STUDIES/RESULTS: No results found.   LOS: 26 days   Oren Binet, MD  [facility]    To contact the attending provider between 7A-7P or the covering provider during after hours  7P-7A, please log into the web site [email or web address] and access using universal [place] password for that web site. If you do not have the password, please call the hospital operator.  08/26/2022, 11:49 AM

## 2022-08-27 DIAGNOSIS — N179 Acute kidney failure, unspecified: Secondary | ICD-10-CM

## 2022-08-27 DIAGNOSIS — E43 Unspecified severe protein-calorie malnutrition: Secondary | ICD-10-CM | POA: Diagnosis not present

## 2022-08-27 DIAGNOSIS — K858 Other acute pancreatitis without necrosis or infection: Secondary | ICD-10-CM | POA: Diagnosis not present

## 2022-08-27 DIAGNOSIS — I10 Essential (primary) hypertension: Secondary | ICD-10-CM | POA: Diagnosis not present

## 2022-08-27 LAB — COMPREHENSIVE METABOLIC PANEL
ALT: 29 U/L (ref 0–44)
AST: 19 U/L (ref 15–41)
Albumin: 2.1 g/dL — ABNORMAL LOW (ref 3.5–5.0)
Alkaline Phosphatase: 72 U/L (ref 38–126)
Anion gap: 6 (ref 5–15)
BUN: 24 mg/dL — ABNORMAL HIGH (ref 8–23)
CO2: 29 mmol/L (ref 22–32)
Calcium: 9.9 mg/dL (ref 8.9–10.3)
Chloride: 102 mmol/L (ref 98–111)
Creatinine, Ser: 1.33 mg/dL — ABNORMAL HIGH (ref 0.61–1.24)
GFR, Estimated: 54 mL/min — ABNORMAL LOW (ref >60)
Glucose, Bld: 84 mg/dL (ref 70–99)
Potassium: 4.4 mmol/L (ref 3.5–5.1)
Sodium: 137 mmol/L (ref 135–145)
Total Bilirubin: 0.5 mg/dL (ref 0.3–1.2)
Total Protein: 6.3 g/dL — ABNORMAL LOW (ref 6.5–8.1)

## 2022-08-27 LAB — CBC
HCT: 34.7 % — ABNORMAL LOW (ref 39.0–52.0)
Hemoglobin: 11 g/dL — ABNORMAL LOW (ref 13.0–17.0)
MCH: 29.1 pg (ref 26.0–34.0)
MCHC: 31.7 g/dL (ref 30.0–36.0)
MCV: 91.8 fL (ref 80.0–100.0)
Platelets: 316 10*3/uL (ref 150–400)
RBC: 3.78 MIL/uL — ABNORMAL LOW (ref 4.22–5.81)
RDW: 16.3 % — ABNORMAL HIGH (ref 11.5–15.5)
WBC: 9.2 10*3/uL (ref 4.0–10.5)
nRBC: 0 % (ref 0.0–0.2)

## 2022-08-27 LAB — GLUCOSE, CAPILLARY
Glucose-Capillary: 88 mg/dL (ref 70–99)
Glucose-Capillary: 140 mg/dL — ABNORMAL HIGH (ref 70–99)
Glucose-Capillary: 170 mg/dL — ABNORMAL HIGH (ref 70–99)
Glucose-Capillary: 136 mg/dL — ABNORMAL HIGH (ref 70–99)
Glucose-Capillary: 124 mg/dL — ABNORMAL HIGH (ref 70–99)

## 2022-08-27 LAB — HEPARIN LEVEL (UNFRACTIONATED)
Heparin Unfractionated: 0.88 IU/mL — ABNORMAL HIGH (ref 0.30–0.70)
Heparin Unfractionated: 0.67 IU/mL (ref 0.30–0.70)

## 2022-08-27 MED ORDER — LACTATED RINGERS IV SOLN: INTRAVENOUS | Status: AC

## 2022-08-27 MED ORDER — ADULT MULTIVITAMIN W/MINERALS CH
1.0000 | ORAL_TABLET | Freq: Every day | ORAL | Status: DC
  Administered 2022-08-27 – 2022-09-08 (×13): 1 via ORAL
  Filled 2022-08-27 (×13): qty 1

## 2022-08-27 NOTE — Progress Notes (Signed)
Nutrition Follow-up  DOCUMENTATION CODES:   Severe malnutrition in context of chronic illness  INTERVENTION:   Continue Ensure Enlive po BID, each supplement provides 350 kcal and 20 grams of protein. Multivitamin w/ minerals daily Encourage good PO intake   NUTRITION DIAGNOSIS:   Severe Malnutrition related to chronic illness (COPD, HF, acute pancreatitis) as evidenced by severe fat depletion, moderate fat depletion, severe muscle depletion, moderate muscle depletion. - Ongoing   GOAL:   Patient will meet greater than or equal to 90% of their needs - Being met via TF  MONITOR:   PO intake, Supplement acceptance, Labs, I & O's  REASON FOR ASSESSMENT:   Consult Enteral/tube feeding initiation and management  ASSESSMENT:   Pt admitted with abdomina pain s/p ERCP, found to have acute pancreatitis. PMH significant for bipolar d/o, T2DM, HTN, HLD, GERD, COPD, Afib, dHF, AAA, interim h/o acute cholecystitis. S/p laparoscopic cholecystectomy with laparoscopic common duct exploration 7/23  8/11 - diet advanced to full liquids 8/14 - diet advanced to SOFT diet  8/15 - s/p CT guided abdominal drain placement; diet downgraded to full liquids 8/16 - NPO; Cortrak placed (tip near distal pylorus/proximal duodenum; TF started  8/19 - diet advanced to regular 8/23 - repeat abdominal CT 8/27 - diet downgraded to Dysphagia 3 8/30 - Cortrak removed  Pt laying in chair, wife at bedside. Pt reports that he is still having pain in his side. Denies any nausea or vomiting. Reports that he is drinking most of the Ensures. Wife bringing in yogurt for pt to eat. Shares that he is still eating softer foods such as mashed potatoes, oatmeal, and yogurt.  Meal completions: 30-65% x 4 meals  Medications reviewed and include: NovoLog, Levemir, Miralax, Senokot-S Labs reviewed: BUN 24, Creatinine 1.33, 24 hr CBG 88-164  Diet Order:   Diet Order             DIET DYS 3 Room service appropriate? Yes;  Fluid consistency: Thin  Diet effective now                   EDUCATION NEEDS:   No education needs have been identified at this time  Skin:  Skin Assessment: Skin Integrity Issues: Skin Integrity Issues:: Stage II Stage II: L Buttocks Other: RU abdomen drain  Last BM:  8/29  Height:   Ht Readings from Last 1 Encounters:  07/30/22 6' 1"  (1.854 m)    Weight:   Wt Readings from Last 1 Encounters:  08/24/22 80.2 kg    Ideal Body Weight:  83.6 kg  BMI:  Body mass index is 23.33 kg/m.  Estimated Nutritional Needs:   Kcal:  2200-2400  Protein:  115-130g  Fluid:  >/=2L    Hermina Barters RD, LDN Clinical Dietitian See [facility] for contact information.

## 2022-08-27 NOTE — Progress Notes (Addendum)
Subjective: Seen and examined on bedside chair in presence of his wife and brother. Continues to have abdominal pain although it is less with use of pain medications. Nasoenteric tube was discontinued yesterday. He has been able to tolerate solids. Complains of right-sided upper abdominal pain.  Objective: Vital signs in last 24 hours: Temp:  [97.5 F (36.4 C)-98.3 F (36.8 C)] 97.5 F (36.4 C) (08/31 0754) Pulse Rate:  [66-82] 71 (08/31 0754) Resp:  [12-20] 19 (08/31 0754) BP: (105-147)/(49-95) 120/59 (08/31 0913) SpO2:  [90 %-96 %] 96 % (08/31 0754) Weight change:  Last BM Date : 08/25/22  PE: Elderly, thinly built, not in distress GENERAL: Mild pallor, no icterus  ABDOMEN: Right upper abdomen drain with minimal amount of bilious and sanguinous fluid, otherwise mild tenderness in epigastric, right and left upper quadrant, normoactive bowel sounds EXTREMITIES: No edema, bilateral compression stockings noted  Lab Results: Results for orders placed or performed during the hospital encounter of 07/30/22 (from the past 48 hour(s))  Glucose, capillary     Status: Abnormal   Collection Time: 08/25/22 12:57 PM  Result Value Ref Range   Glucose-Capillary 215 (H) 70 - 99 mg/dL    Comment: Glucose reference range applies only to samples taken after fasting for at least 8 hours.  Glucose, capillary     Status: Abnormal   Collection Time: 08/25/22  3:57 PM  Result Value Ref Range   Glucose-Capillary 155 (H) 70 - 99 mg/dL    Comment: Glucose reference range applies only to samples taken after fasting for at least 8 hours.  Glucose, capillary     Status: Abnormal   Collection Time: 08/25/22  8:26 PM  Result Value Ref Range   Glucose-Capillary 105 (H) 70 - 99 mg/dL    Comment: Glucose reference range applies only to samples taken after fasting for at least 8 hours.  Glucose, capillary     Status: Abnormal   Collection Time: 08/25/22 11:59 PM  Result Value Ref Range   Glucose-Capillary  106 (H) 70 - 99 mg/dL    Comment: Glucose reference range applies only to samples taken after fasting for at least 8 hours.  Glucose, capillary     Status: Abnormal   Collection Time: 08/26/22  3:43 AM  Result Value Ref Range   Glucose-Capillary 106 (H) 70 - 99 mg/dL    Comment: Glucose reference range applies only to samples taken after fasting for at least 8 hours.  Glucose, capillary     Status: Abnormal   Collection Time: 08/26/22  7:58 AM  Result Value Ref Range   Glucose-Capillary 112 (H) 70 - 99 mg/dL    Comment: Glucose reference range applies only to samples taken after fasting for at least 8 hours.  Heparin level (unfractionated)     Status: None   Collection Time: 08/26/22  8:21 AM  Result Value Ref Range   Heparin Unfractionated 0.62 0.30 - 0.70 IU/mL    Comment: (NOTE) The clinical reportable range upper limit is being lowered to >1.10 to align with the FDA approved guidance for the current laboratory assay.  If heparin results are below expected values, and patient dosage has  been confirmed, suggest follow up testing of antithrombin III levels. Performed at [facility], [place] [address] [place], [place] [address]   CBC     Status: Abnormal   Collection Time: 08/26/22  8:21 AM  Result Value Ref Range   WBC 8.6 4.0 - 10.5 K/uL   RBC 3.54 (  L) 4.22 - 5.81 MIL/uL   Hemoglobin 10.5 (L) 13.0 - 17.0 g/dL   HCT 32.5 (L) 39.0 - 52.0 %   MCV 91.8 80.0 - 100.0 fL   MCH 29.7 26.0 - 34.0 pg   MCHC 32.3 30.0 - 36.0 g/dL   RDW 16.0 (H) 11.5 - 15.5 %   Platelets 311 150 - 400 K/uL   nRBC 0.0 0.0 - 0.2 %    Comment: Performed at [facility] [address], [place], [place] [address]  Glucose, capillary     Status: Abnormal   Collection Time: 08/26/22 11:21 AM  Result Value Ref Range   Glucose-Capillary 162 (H) 70 - 99 mg/dL    Comment: Glucose reference range applies only to samples taken after fasting for at least 8 hours.  Glucose, capillary     Status:  Abnormal   Collection Time: 08/26/22  4:11 PM  Result Value Ref Range   Glucose-Capillary 164 (H) 70 - 99 mg/dL    Comment: Glucose reference range applies only to samples taken after fasting for at least 8 hours.  Glucose, capillary     Status: None   Collection Time: 08/26/22  7:48 PM  Result Value Ref Range   Glucose-Capillary 95 70 - 99 mg/dL    Comment: Glucose reference range applies only to samples taken after fasting for at least 8 hours.  Glucose, capillary     Status: None   Collection Time: 08/26/22 11:49 PM  Result Value Ref Range   Glucose-Capillary 96 70 - 99 mg/dL    Comment: Glucose reference range applies only to samples taken after fasting for at least 8 hours.  Heparin level (unfractionated)     Status: Abnormal   Collection Time: 08/27/22  4:33 AM  Result Value Ref Range   Heparin Unfractionated 0.88 (H) 0.30 - 0.70 IU/mL    Comment: (NOTE) The clinical reportable range upper limit is being lowered to >1.10 to align with the FDA approved guidance for the current laboratory assay.  If heparin results are below expected values, and patient dosage has  been confirmed, suggest follow up testing of antithrombin III levels. Performed at [facility], [place] [address] [place], [place] [address]   CBC     Status: Abnormal   Collection Time: 08/27/22  4:33 AM  Result Value Ref Range   WBC 9.2 4.0 - 10.5 K/uL   RBC 3.78 (L) 4.22 - 5.81 MIL/uL   Hemoglobin 11.0 (L) 13.0 - 17.0 g/dL   HCT 34.7 (L) 39.0 - 52.0 %   MCV 91.8 80.0 - 100.0 fL   MCH 29.1 26.0 - 34.0 pg   MCHC 31.7 30.0 - 36.0 g/dL   RDW 16.3 (H) 11.5 - 15.5 %   Platelets 316 150 - 400 K/uL   nRBC 0.0 0.0 - 0.2 %    Comment: Performed at [facility], [place] [address] [place],  [address]  Comprehensive metabolic panel     Status: Abnormal   Collection Time: 08/27/22  4:33 AM  Result Value Ref Range   Sodium 137 135 - 145 mmol/L   Potassium 4.4 3.5 - 5.1 mmol/L   Chloride 102 98 - 111  mmol/L   CO2 29 22 - 32 mmol/L   Glucose, Bld 84 70 - 99 mg/dL    Comment: Glucose reference range applies only to samples taken after fasting for at least 8 hours.   BUN 24 (H) 8 - 23 mg/dL  Creatinine, Ser 1.33 (H) 0.61 - 1.24 mg/dL   Calcium 9.9 8.9 - 10.3 mg/dL   Total Protein 6.3 (L) 6.5 - 8.1 g/dL   Albumin 2.1 (L) 3.5 - 5.0 g/dL   AST 19 15 - 41 U/L   ALT 29 0 - 44 U/L   Alkaline Phosphatase 72 38 - 126 U/L   Total Bilirubin 0.5 0.3 - 1.2 mg/dL   GFR, Estimated 54 (L) >60 mL/min    Comment: (NOTE) Calculated using the CKD-EPI Creatinine Equation (2021)    Anion gap 6 5 - 15    Comment: Performed at [facility] [address] [place], [place] [address]  Glucose, capillary     Status: None   Collection Time: 08/27/22  7:53 AM  Result Value Ref Range   Glucose-Capillary 88 70 - 99 mg/dL    Comment: Glucose reference range applies only to samples taken after fasting for at least 8 hours.    Studies/Results: No results found.  Medications: I have reviewed the patient's current medications.  Assessment: Pancreatitis and pancreatic pseudocyst Status post ERCP with stone removal Subhepatic fluid collection, status post IR drain placement  Normocytic anemia Albumin 2.1, protein 6.3 Mildly impaired renal function -BUN 24/creatinine 1.33/GFR 54  Plan: Tolerating low-fat diet, continue the same. Pain controlled with fentanyl patch and oxycodone as needed. For constipation he is on senna and MiraLAX. Encourage increased fluid intake, physical therapy and mobilization. We will start lactated Ringer's at 50 cc/mL for 24 hours.  Ronnette Juniper, MD 08/27/2022, 11:05 AM

## 2022-08-27 NOTE — TOC Progression Note (Signed)
Transition of Care (TOC) - Progression Note    Patient Details  Name: Douglas Hurst. MRN: [id number] Date of Birth: 09-02-1941  Transition of [facility]) CM/SW [place], LCSW Phone Number: 08/27/2022, 1:44 PM  Clinical Narrative:    CSW met with patient's spouse and his brother at bedside. CSW made them aware that [facility] is not able to accept patient. They are requesting [facility] as they have had family there before. CSW explained insurance process and copays after day 20 at SNF and answered additional questions. [facility] able to accept patient pending Pasrr (waiting on in person review) and insurance approval.    Expected Discharge Plan: [place] Barriers to Discharge: Ship broker, Continued Medical Work up, Environmental education officer)  Expected Discharge Plan and Services Expected Discharge Plan: [place] In-house Referral: Clinical Social Work   Post Acute Care Choice: [place] Living arrangements for the past 2 months: Single Family Home                                       Social Determinants of Health (SDOH) Interventions    Readmission Risk Interventions     No data to display

## 2022-08-27 NOTE — Progress Notes (Signed)
Physical Therapy Treatment Patient Details Name: Douglas Hurst. MRN: [id number] DOB: 12-25-1941 Today's Date: 08/27/2022   History of Present Illness Patient is a 81 yo male presenting to the ED with severe abdominal pain with radiation to right shoulder on 07/31/22. Patient had underwent ECRP for removal of stones from biliary and pancreatic duct on 8/3. Admitted with acute pancreatitis. Patient with cortrak and Perihepatic drain placement. PMH includes: bipolar d/o DMII, HTN, HLD, GERD, COPD ,  Afib ,diastolic heart failure, AAA with interim history of acute cholecystitis.    PT Comments    Pt received seated EOB, spouse in room and encouraging, pt agreeable to therapy session with encouragement. Pt able to perform pre-gait standing tasks at bedside with minA and transfers with min to modA and RW this date, as well as seated/supine LE exercises for strengthening with good tolerance and stable VS. Pt encouraged to continue IS use for endurance building. Per spouse he sat up in chair >4 hours this date. Pt continues to benefit from PT services to progress toward functional mobility goals.   Recommendations for follow up therapy are one component of a multi-disciplinary discharge planning process, led by the attending physician.  Recommendations may be updated based on patient status, additional functional criteria and insurance authorization.  Follow Up Recommendations  Skilled nursing-short term rehab (<3 hours/day) Can patient physically be transported by private vehicle: Yes   Assistance Recommended at Discharge Frequent or constant Supervision/Assistance  Patient can return home with the following A lot of help with walking and/or transfers;A lot of help with bathing/dressing/bathroom;Assistance with cooking/housework;Direct supervision/assist for medications management;Direct supervision/assist for financial management;Assist for transportation;Help with stairs or ramp for entrance    Equipment Recommendations  None recommended by PT    Recommendations for Other Services       Precautions / Restrictions Precautions Precautions: Fall Precaution Comments: RUQ drain Restrictions Weight Bearing Restrictions: No     Mobility  Bed Mobility Overal bed mobility: Needs Assistance Bed Mobility: Sit to Sidelying, Rolling Rolling: Min assist       Sit to sidelying: Mod assist, +2 for safety/equipment (spouse stand-by assist) General bed mobility comments: cues for log roll return to supine, BLE assist, pt using bed rail    Transfers Overall transfer level: Needs assistance Equipment used: Rolling walker (2 wheels), 1 person hand held assist Transfers: Sit to/from Stand Sit to Stand: Mod assist, Min assist           General transfer comment: from EOB<>RW and with HHA on second trial pushing from bed side rail    Ambulation/Gait Ambulation/Gait assistance: Min assist, +2 safety/equipment Gait Distance (Feet): 7 Feet Assistive device: Rolling walker (2 wheels) Gait Pattern/deviations: Trunk flexed, Narrow base of support, Shuffle, Decreased stride length, Step-to pattern       General Gait Details: assist to steady, guide RW, support trunk, sidesteps to foot of bed then back to [facility], pt defers ambulation away from bed due to fatigue.   Stairs             Wheelchair Mobility    Modified Rankin (Stroke Patients Only)       Balance Overall balance assessment: Needs assistance Sitting-balance support: Feet supported, Bilateral upper extremity supported Sitting balance-Leahy Scale: Fair     Standing balance support: During functional activity, Reliant on assistive device for balance, Bilateral upper extremity supported, Single extremity supported Standing balance-Leahy Scale: Poor Standing balance comment: RW vs HHA and bed side rail for support  Cognition Arousal/Alertness: Awake/alert Behavior During  Therapy: Flat affect Overall Cognitive Status: Impaired/Different from baseline Area of Impairment: Problem solving, Awareness, Safety/judgement, Attention, Following commands                   Current Attention Level: Selective   Following Commands: Follows one step commands with increased time Safety/Judgement: Decreased awareness of safety, Decreased awareness of deficits Awareness: Emergent Problem Solving: Requires verbal cues, Decreased initiation, Difficulty sequencing General Comments: Patient's wife present and assisted with encouraging patient to participate. Pt initially sitting EOB and wanting to defer but agreeable to minimal tasks at bedside as he recently got to EOB from chair.        Exercises General Exercises - Lower Extremity Ankle Circles/Pumps: AROM, Both, Supine, 10 reps Quad Sets: AROM, Both, 5 reps, Supine Long Arc Quad: AROM, Both, 10 reps, Seated Heel Slides: AROM, Both, 5 reps, Supine Hip ABduction/ADduction: AROM, Both, 5 reps, Supine Hip Flexion/Marching: AROM, Both, 10 reps, Standing Other Exercises Other Exercises: STS x 2 reps from EOB for strengthening    General Comments General comments (skin integrity, edema, etc.): SpO2 WFL on RA, HR 60-70's bpm, BP 129/65 (84) seated EOB, no dizziness      Pertinent Vitals/Pain Pain Assessment Pain Assessment: Faces Faces Pain Scale: Hurts little more Pain Location: abdomen intermittent Pain Descriptors / Indicators: Grimacing Pain Intervention(s): Monitored during session, Limited activity within patient's tolerance, Repositioned           PT Goals (current goals can now be found in the care plan section) Acute Rehab PT Goals Patient Stated Goal: to get stronger and pain reduced. PT Goal Formulation: With patient/family Time For Goal Achievement: 09/07/22 Progress towards PT goals: Progressing toward goals    Frequency    Min 2X/week      PT Plan Current plan remains appropriate        AM-PAC PT "6 Clicks" Mobility   Outcome Measure  Help needed turning from your back to your side while in a flat bed without using bedrails?: A Little Help needed moving from lying on your back to sitting on the side of a flat bed without using bedrails?: A Lot Help needed moving to and from a bed to a chair (including a wheelchair)?: A Lot Help needed standing up from a chair using your arms (e.g., wheelchair or bedside chair)?: A Lot Help needed to walk in hospital room?: Total Help needed climbing 3-5 steps with a railing? : Total 6 Click Score: 11    End of Session Equipment Utilized During Treatment: Gait belt Activity Tolerance: Patient limited by fatigue;Patient tolerated treatment well Patient left: in bed;with call bell/phone within reach;with bed alarm set;with family/visitor present;Other (comment) (bed in chair posture) Nurse Communication: Mobility status PT Visit Diagnosis: Muscle weakness (generalized) (M62.81);Difficulty in walking, not elsewhere classified (R26.2);Other abnormalities of gait and mobility (R26.89);Pain Pain - Right/Left: Right Pain - part of body:  (flank/drain area)     Time: 4259-5638 PT Time Calculation (min) (ACUTE ONLY): 19 min  Charges:  $Therapeutic Exercise: 8-22 mins                     Lidya Mccalister P., PTA Acute Rehabilitation Services Secure Chat Preferred 9a-5:30pm Office: [place] 08/27/2022, 6:42 PM

## 2022-08-27 NOTE — TOC Progression Note (Signed)
Transition of Care (TOC) - Progression Note    Patient Details  Name: Douglas Hurst. MRN: [id number] Date of Birth: 11/01/1941  Transition of Care [facility]) CM/SW [place], LCSW Phone Number: 08/27/2022, 9:39 AM  Clinical Narrative:    CSW received notification that pasrr will require an in-person evaluation of patient.    Expected Discharge Plan: IP Rehab Facility Barriers to Discharge: Ship broker, Continued Medical Work up ([facility] vs. snf)  Expected Discharge Plan and Services Expected Discharge Plan: [place] In-house Referral: Clinical Social Work   Post Acute Care Choice: IP Rehab Living arrangements for the past 2 months: Single Family Home                                       Social Determinants of Health (SDOH) Interventions    Readmission Risk Interventions     No data to display

## 2022-08-27 NOTE — Progress Notes (Signed)
ANTICOAGULATION CONSULT NOTE - Follow Up Consult  Pharmacy Consult for heparin Indication: atrial fibrillation  Labs: Recent Labs    08/25/22 0250 08/26/22 0821 08/27/22 0433  HGB 10.6* 10.5* 11.0*  HCT 31.7* 32.5* 34.7*  PLT 351 311 316  HEPARINUNFRC 0.43 0.62 0.88*  CREATININE 1.07  --  1.33*    Assessment: 80yo male supratherapeutic on heparin after several levels at goal; no infusion issues or signs of bleeding per RN.  Goal of Therapy:  Heparin level 0.3-0.7 units/ml   Plan:  Will decrease heparin infusion by 2 units/kg/hr to 1300 units/hr and check level in 8 hours.    Wynona Neat, PharmD, BCPS  08/27/2022,5:54 AM

## 2022-08-27 NOTE — Progress Notes (Signed)
ANTICOAGULATION CONSULT NOTE - Follow Up Consult  Pharmacy Consult for Heparin Indication: atrial fibrillation  No Known Allergies  Patient Measurements: Height: '6\' 1"'$  (185.4 cm) Weight: 80.2 kg (176 lb 12.9 oz) IBW/kg (Calculated) : 79.9 Heparin Dosing Weight: 80 kg  Vital Signs: Temp: 98.4 F (36.9 C) (08/31 1151) Temp Source: Oral (08/31 1151) BP: 115/58 (08/31 1151) Pulse Rate: 70 (08/31 1151)  Labs: Recent Labs    08/25/22 0250 08/26/22 0821 08/27/22 0433 08/27/22 1519  HGB 10.6* 10.5* 11.0*  --   HCT 31.7* 32.5* 34.7*  --   PLT 351 311 316  --   HEPARINUNFRC 0.43 0.62 0.88* 0.67  CREATININE 1.07  --  1.33*  --      Estimated Creatinine Clearance: 50.9 mL/min (A) (by C-G formula based on SCr of 1.33 mg/dL (H)).  [name], no bolus per MD. Last dose of Eliquis was on 07/26/22 PTA.   Heparin level trended up to 0.88 this am on 1450 units/hr. Rate decreased to 1300 units/hr and heparin level is now 0.67, in range.  CBC stable.  Goal of Therapy:  Heparin level 0.3-0.7 units/ml Monitor platelets by anticoagulation protocol: Yes   Plan:  Continue heparin drip at 1300 units/hr Daily heparin level and CBC. PTA Eliquis on hold. Follow up for transition back to Eliquis when tolerating PO and no further procedures planned.  Arty Baumgartner, [place] 08/27/2022,4:01 PM

## 2022-08-27 NOTE — Progress Notes (Signed)
Occupational Therapy Treatment Patient Details Name: Douglas Hurst. MRN: [id number] DOB: 05-25-1941 Today's Date: 08/27/2022   History of present illness Patient is a 81 yo male presenting to the ED with severe abdominal pain with radiation to right shoulder on 07/31/22. Patient had underwent ECRP for removal of stones from biliary and pancreatic duct on 8/3. Admitted with acute pancreatitis. Patient with cortrak and Perihepatic drain placement. PMH includes: bipolar d/o DMII, HTN, HLD, GERD, COPD ,  Afib ,diastolic heart failure, AAA with interim history of acute cholecystitis.   OT comments  Patient in recliner upon entry. Patient's wife and brother present and assisted in encouraging patient to participate. Patient stood at sink from recliner to perform grooming with min assist to min guard assist for balance and completed grooming seated due to fatigue. Patient performed gown change seated. Patient is making progress with standing ADLs and could benefit from further OT services.    Recommendations for follow up therapy are one component of a multi-disciplinary discharge planning process, led by the attending physician.  Recommendations may be updated based on patient status, additional functional criteria and insurance authorization.    Follow Up Recommendations  Skilled nursing-short term rehab (<3 hours/day)    Assistance Recommended at Discharge Frequent or constant Supervision/Assistance  Patient can return home with the following  Assist for transportation;Help with stairs or ramp for entrance;Direct supervision/assist for medications management;A lot of help with walking and/or transfers;A lot of help with bathing/dressing/bathroom;Assistance with cooking/housework   Equipment Recommendations  Other (comment) (Defer to next venue)    Recommendations for Other Services      Precautions / Restrictions Precautions Precautions: Fall Precaution Comments: cortrak,  RUQ  drain Restrictions Weight Bearing Restrictions: No       Mobility Bed Mobility Overal bed mobility: Needs Assistance             General bed mobility comments: up in chair    Transfers Overall transfer level: Needs assistance Equipment used: Rolling walker (2 wheels) Transfers: Sit to/from Stand Sit to Stand: Mod assist           General transfer comment: stood from recliner at sink     Balance Overall balance assessment: Needs assistance Sitting-balance support: Feet supported, Bilateral upper extremity supported Sitting balance-Leahy Scale: Fair     Standing balance support: During functional activity, Reliant on assistive device for balance, Bilateral upper extremity supported, Single extremity supported Standing balance-Leahy Scale: Poor Standing balance comment: able to stand at sink with one extremity support                           ADL either performed or assessed with clinical judgement   ADL Overall ADL's : Needs assistance/impaired     Grooming: Wash/dry hands;Wash/dry face;Oral care;Standing;Min guard;Minimal assistance Grooming Details (indicate cue type and reason): stood at sink for grooming with min assist to min guard assist for balance         Upper Body Dressing : Minimal assistance;Sitting Upper Body Dressing Details (indicate cue type and reason): changed gowns                   General ADL Comments: quickly fatigued standing at sink    Extremity/Trunk Assessment Upper Extremity Assessment RUE Deficits / Details: Muscles Quick to fatigue BUEs RUE Sensation: WNL RUE Coordination: WNL LUE Deficits / Details: WFl ROM and 5/5 strength per chart but limited activity to assess LUE Sensation: WNL LUE  Coordination: WNL            Vision       Perception     Praxis      Cognition Arousal/Alertness: Awake/alert Behavior During Therapy: Flat affect Overall Cognitive Status: Impaired/Different from  baseline Area of Impairment: Problem solving, Awareness, Safety/judgement, Attention, Following commands                   Current Attention Level: Selective   Following Commands: Follows one step commands with increased time Safety/Judgement: Decreased awareness of safety, Decreased awareness of deficits Awareness: Emergent Problem Solving: Requires verbal cues, Decreased initiation, Difficulty sequencing General Comments: Patient's brother and wife present and assisted with encouraging patient to participate.        Exercises      Shoulder Instructions       General Comments      Pertinent Vitals/ Pain       Pain Assessment Pain Assessment: No/denies pain Pain Intervention(s): Monitored during session  Home Living                                          Prior Functioning/Environment              Frequency  Min 2X/week        Progress Toward Goals  OT Goals(current goals can now be found in the care plan section)  Progress towards OT goals: Progressing toward goals  Acute Rehab OT Goals Patient Stated Goal: go home OT Goal Formulation: With patient Time For Goal Achievement: 09/01/22 Potential to Achieve Goals: Good ADL Goals Pt Will Perform Grooming: with min guard assist;standing Pt Will Perform Lower Body Bathing: with min assist;sit to/from stand Pt Will Perform Lower Body Dressing: with min assist;sit to/from stand Pt Will Transfer to Toilet: with min guard assist;ambulating;bedside commode Pt Will Perform Toileting - Clothing Manipulation and hygiene: with modified independence;sit to/from stand Pt/caregiver will Perform Home Exercise Program: Both right and left upper extremity;Increased strength;With theraband;With written HEP provided Additional ADL Goal #1: Patient will demonstrate increased activity tolerance to complete functional task in standing for 3-5 minutes without need for seated rest break.  Plan Discharge  plan remains appropriate;Frequency remains appropriate    Co-evaluation                 AM-PAC OT "6 Clicks" Daily Activity     Outcome Measure   Help from another person eating meals?: A Little (needs encouragement for PO) Help from another person taking care of personal grooming?: A Little Help from another person toileting, which includes using toliet, bedpan, or urinal?: A Lot Help from another person bathing (including washing, rinsing, drying)?: A Lot Help from another person to put on and taking off regular upper body clothing?: A Little Help from another person to put on and taking off regular lower body clothing?: A Lot 6 Click Score: 15    End of Session Equipment Utilized During Treatment: Gait belt  OT Visit Diagnosis: Unsteadiness on feet (R26.81);Muscle weakness (generalized) (M62.81);Other symptoms and signs involving cognitive function;Pain Pain - Right/Left: Right   Activity Tolerance Patient limited by fatigue   Patient Left in chair;with call bell/phone within reach;with family/visitor present   Nurse Communication Mobility status        Time: 3875-6433 OT Time Calculation (min): 23 min  Charges: OT General Charges $OT Visit: 1 Visit OT Treatments $  Self Care/Home Management : 23-37 mins  Lodema Hong, OTA Acute Rehabilitation Services  Office [phone]   Trixie Dredge 08/27/2022, 12:37 PM

## 2022-08-27 NOTE — Progress Notes (Signed)
PROGRESS NOTE        PATIENT DETAILS Name: Douglas Hurst. Age: 81 y.o. Sex: male Date of Birth: [birth year] Admit Date: 07/30/2022 Admitting Physician Clance Boll, MD WHQ:[id number], [name], MD  Brief Summary: Patient is a 81 y.o.  male with history of PAF on Eliquis, HTN, HLD, COPD, chronic HFrEF (recovered EF)-with recent history of cholecystitis-s/p cholecystectomy but with retained CBD stone.  ERCP here was unsuccessful.  Patient was referred to [facility] underwent advanced endoscopy via transduodenal approach for ERCP on 8/3 at [facility] with Dr. Okey Dupre -he subsequently presented to the ED with severe abdominal pain-was found to have pancreatitis and admitted to the hospitalist service.  Course was complicated by A-fib RVR, pseudocyst formation, subhepatic fluid collection requiring CT-guided drain placement.    Significant events: 8/3>> admit to [facility]-abdominal pain after ERCP at [facility]-found to have acute pancreatitis 8/5>> A-fib RVR-no response to IV Lopressor-blood pressure soft-amiodarone infusion started.  After discussion with GI-heparin started without bolus.  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 8/5>> CT abdomen/pelvis: Resolution of hypoenhancement of the pancreas-persistent inflammatory changes within the retroperitoneum surrounding the head of the pancreas extending into the porta hepatis.  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 8/13>> CT abdomen/pelvis: Increased size of large perihepatic pseudocysts 8/23>> CT abdomen/pelvis: Interval decrease in loculated perihepatic fluid collection after percutaneous drain placement.  Significant microbiology data: 8/15>> perihepatic  fluid culture: No growth  Procedures: 8/15>> CT-guided drain placement right subcapsular fluid collection. 8/16>>Cortak placed (removed 8/30) 8/24>> s/p exchange and size up of perihepatic drain.  Consults: GI Phone consult-cards-Dr Crenshaw IR Palliative care  Subjective: No complaints-abdominal pain minimal this morning.  Tolerating diet.  NG tube was discontinued yesterday.  Objective: Vitals: Blood pressure (!) 115/58, pulse 70, temperature 98.4 F (36.9 C), temperature source Oral, resp. rate 17, height '6\' 1"'$  (1.854 m), weight 80.2 kg, SpO2 94 %.   Exam: Gen Exam:Alert awake-not in any distress HEENT:atraumatic, normocephalic Chest: B/L clear to auscultation anteriorly CVS:S1S2 regular Abdomen:soft non tender, non distended.  RUQ drain remains in place. Extremities:no edema Neurology: Non focal Skin: no rash   Pertinent Labs/Radiology:    Latest Ref Rng & Units 08/27/2022    4:33 AM 08/26/2022    8:21 AM 08/25/2022    2:50 AM  CBC  WBC 4.0 - 10.5 K/uL 9.2  8.6  10.4   Hemoglobin 13.0 - 17.0 g/dL 11.0  10.5  10.6   Hematocrit 39.0 - 52.0 % 34.7  32.5  31.7   Platelets 150 - 400 K/uL 316  311  351     Lab Results  Component Value Date   NA 137 08/27/2022   K 4.4 08/27/2022   CL 102 08/27/2022   CO2 29 08/27/2022      Assessment/Plan: Acute post ERCP pancreatitis with pseudocyst formation: Overall improved-NG tube removed 8/30-tolerating advancement in diet.    Subhepatic space fluid collection: Likely due to leak from ERCP done via trans duodenal approach.  Drain in place-IR following.  PAF with RVR: Due to acute illness-maintaining sinus rhythm-on amiodarone-IV heparin to transition to oral DOAC over the next few days.   Acute hypoxic respiratory failure: Due to pleural effusion/atelectasis-improved-on room  air.  Continue mobilization.  Encourage incentive spirometry.  AKI: Resolved-likely hemodynamically mediated.  Acute on chronic HFrEF with recovered  EF: Volume status stable-no longer on furosemide.  AKI: Mild-IV hydration plan x24 hours-avoid nephrotoxic agents-recheck electrolytes tomorrow.  If worsens-we will plan to initiate further work-up.  Will ask nursing staff to do periodic bladder scans.  Normocytic anemia: Due to critical/acute illness-no evidence of blood loss.  Follow CBC periodically.  DM-2: CBGs stable Levemir 15 units twice daily and SSI.  Recent Labs    08/26/22 2349 08/27/22 0753 08/27/22 1152  GLUCAP 96 88 140*     HTN: BP stable-continue metoprolol.  BPH: Continue Flomax  COPD: Continue bronchodilators  Infrarenal abdominal aortic aneurysm 4 mm: Unchanged from prior study-annual follow-up is required.  Debility/deconditioning: Due to acute illness-SNF planned on discharge.  Nutrition Status: Nutrition Problem: Severe Malnutrition Etiology: chronic illness (COPD, HF, acute pancreatitis) Signs/Symptoms: severe fat depletion, moderate fat depletion, severe muscle depletion, moderate muscle depletion Interventions: Tube feeding, Liberalize Diet    BMI: Estimated body mass index is 23.33 kg/m as calculated from the following:   Height as of this encounter: '6\' 1"'$  (1.854 m).   Weight as of this encounter: 80.2 kg.   Code status:   Code Status: Full Code   DVT Prophylaxis:IV heparin    Family Communication: Brother at bedside   Disposition Plan: Status is: Inpatient Remains inpatient appropriate because: Less-NG tube being removed today-diet being advanced-eventually to SNF over the next few days.  Planned Discharge Destination:SNF   Diet: Diet Order             DIET DYS 3 Room service appropriate? Yes; Fluid consistency: Thin  Diet effective now                     Antimicrobial agents: Anti-infectives (From admission, onward)    Start     Dose/Rate Route Frequency Ordered Stop   08/02/22 1600  piperacillin-tazobactam (ZOSYN) IVPB 3.375 g  Status:  Discontinued        3.375  g 12.5 mL/hr over 240 Minutes Intravenous Every 8 hours 08/02/22 1039 08/13/22 1505   08/02/22 1130  piperacillin-tazobactam (ZOSYN) IVPB 4.5 g        4.5 g 200 mL/hr over 30 Minutes Intravenous  Once 08/02/22 1039 08/02/22 1240   07/31/22 0400  meropenem (MERREM) 1 g in sodium chloride 0.9 % 100 mL IVPB  Status:  Discontinued        1 g 200 mL/hr over 30 Minutes Intravenous Every 8 hours 07/31/22 0252 08/02/22 1039   07/30/22 2245  piperacillin-tazobactam (ZOSYN) IVPB 3.375 g  Status:  Discontinued        3.375 g 12.5 mL/hr over 240 Minutes Intravenous Every 8 hours 07/30/22 2239 07/31/22 0239        MEDICATIONS: Scheduled Meds:  acetaminophen  1,000 mg Oral TID   amiodarone  200 mg Oral Daily   feeding supplement  237 mL Oral BID BM   fentaNYL  1 patch Transdermal Q72H   insulin aspart  0-9 Units Subcutaneous Q4H   insulin detemir  15 Units Subcutaneous BID   metoprolol tartrate  25 mg Oral BID   polyethylene glycol  17 g Oral BID   senna-docusate  1 tablet Oral Daily   sodium chloride flush  5 mL Intracatheter Q8H   tamsulosin  0.8 mg Oral Daily   Continuous Infusions:  heparin 1,300 Units/hr (08/27/22 0600)   lactated ringers 50 mL/hr at 08/27/22  1140   PRN Meds:.ondansetron **OR** ondansetron (ZOFRAN) IV, oxyCODONE, umeclidinium bromide   I have personally reviewed following labs and imaging studies  LABORATORY DATA: CBC: Recent Labs  Lab 08/23/22 0317 08/24/22 0319 08/25/22 0250 08/26/22 0821 08/27/22 0433  WBC 11.6* 11.6* 10.4 8.6 9.2  HGB 10.4* 10.6* 10.6* 10.5* 11.0*  HCT 32.1* 32.1* 31.7* 32.5* 34.7*  MCV 90.9 89.4 89.5 91.8 91.8  PLT 339 347 351 311 316     Basic Metabolic Panel: Recent Labs  Lab 08/21/22 0334 08/23/22 0317 08/25/22 0250 08/27/22 0433  NA 131* 135 137 137  K 4.6 4.6 4.5 4.4  CL 99 101 103 102  CO2 '28 26 27 29  '$ GLUCOSE 233* 164* 88 84  BUN 36* 32* 25* 24*  CREATININE 1.02 1.00 1.07 1.33*  CALCIUM 9.2 9.5 9.9 9.9      GFR: Estimated Creatinine Clearance: 50.9 mL/min (A) (by C-G formula based on SCr of 1.33 mg/dL (H)).  Liver Function Tests: Recent Labs  Lab 08/27/22 0433  AST 19  ALT 29  ALKPHOS 72  BILITOT 0.5  PROT 6.3*  ALBUMIN 2.1*    No results for input(s): "LIPASE", "AMYLASE" in the last 168 hours.  No results for input(s): "AMMONIA" in the last 168 hours.  Coagulation Profile: No results for input(s): "INR", "PROTIME" in the last 168 hours.  Cardiac Enzymes: No results for input(s): "CKTOTAL", "CKMB", "CKMBINDEX", "TROPONINI" in the last 168 hours.  BNP (last 3 results) No results for input(s): "PROBNP" in the last 8760 hours.  Lipid Profile: No results for input(s): "CHOL", "HDL", "LDLCALC", "TRIG", "CHOLHDL", "LDLDIRECT" in the last 72 hours.  Thyroid Function Tests: No results for input(s): "TSH", "T4TOTAL", "FREET4", "T3FREE", "THYROIDAB" in the last 72 hours.  Anemia Panel: No results for input(s): "VITAMINB12", "FOLATE", "FERRITIN", "TIBC", "IRON", "RETICCTPCT" in the last 72 hours.  Urine analysis:    Component Value Date/Time   COLORURINE YELLOW 08/18/2022 1011   APPEARANCEUR CLOUDY (A) 08/18/2022 1011   LABSPEC 1.013 08/18/2022 1011   PHURINE 9.0 (H) 08/18/2022 1011   GLUCOSEU NEGATIVE 08/18/2022 [place] 08/18/2022 [place] 08/18/2022 1011   KETONESUR NEGATIVE 08/18/2022 1011   PROTEINUR NEGATIVE 08/18/2022 1011   UROBILINOGEN 4.0 (H) 10/02/2015 2135   NITRITE NEGATIVE 08/18/2022 1011   LEUKOCYTESUR NEGATIVE 08/18/2022 1011    Sepsis Labs: Lactic Acid, Venous    Component Value Date/Time   LATICACIDVEN 6.2 (HH) 07/31/2022 0528    MICROBIOLOGY: No results found for this or any previous visit (from the past 240 hour(s)).  RADIOLOGY STUDIES/RESULTS: No results found.   LOS: 27 days   Oren Binet, MD  [facility]    To contact the attending provider between 7A-7P or the covering provider  during after hours 7P-7A, please log into the web site [email or web address] and access using universal [place] password for that web site. If you do not have the password, please call the hospital operator.  08/27/2022, 12:09 PM

## 2022-08-28 DIAGNOSIS — N179 Acute kidney failure, unspecified: Secondary | ICD-10-CM | POA: Diagnosis not present

## 2022-08-28 DIAGNOSIS — K858 Other acute pancreatitis without necrosis or infection: Secondary | ICD-10-CM | POA: Diagnosis not present

## 2022-08-28 DIAGNOSIS — E43 Unspecified severe protein-calorie malnutrition: Secondary | ICD-10-CM | POA: Diagnosis not present

## 2022-08-28 DIAGNOSIS — I10 Essential (primary) hypertension: Secondary | ICD-10-CM | POA: Diagnosis not present

## 2022-08-28 LAB — BASIC METABOLIC PANEL
Anion gap: 8 (ref 5–15)
BUN: 23 mg/dL (ref 8–23)
CO2: 27 mmol/L (ref 22–32)
Calcium: 9.7 mg/dL (ref 8.9–10.3)
Chloride: 100 mmol/L (ref 98–111)
Creatinine, Ser: 1.13 mg/dL (ref 0.61–1.24)
GFR, Estimated: >60 mL/min (ref >60)
Glucose, Bld: 101 mg/dL — ABNORMAL HIGH (ref 70–99)
Potassium: 4.4 mmol/L (ref 3.5–5.1)
Sodium: 135 mmol/L (ref 135–145)

## 2022-08-28 LAB — CBC
HCT: 32.1 % — ABNORMAL LOW (ref 39.0–52.0)
Hemoglobin: 10.5 g/dL — ABNORMAL LOW (ref 13.0–17.0)
MCH: 29.6 pg (ref 26.0–34.0)
MCHC: 32.7 g/dL (ref 30.0–36.0)
MCV: 90.4 fL (ref 80.0–100.0)
Platelets: 277 10*3/uL (ref 150–400)
RBC: 3.55 MIL/uL — ABNORMAL LOW (ref 4.22–5.81)
RDW: 16.2 % — ABNORMAL HIGH (ref 11.5–15.5)
WBC: 15 10*3/uL — ABNORMAL HIGH (ref 4.0–10.5)
nRBC: 0 % (ref 0.0–0.2)

## 2022-08-28 LAB — GLUCOSE, CAPILLARY
Glucose-Capillary: 88 mg/dL (ref 70–99)
Glucose-Capillary: 94 mg/dL (ref 70–99)
Glucose-Capillary: 145 mg/dL — ABNORMAL HIGH (ref 70–99)
Glucose-Capillary: 117 mg/dL — ABNORMAL HIGH (ref 70–99)
Glucose-Capillary: 115 mg/dL — ABNORMAL HIGH (ref 70–99)
Glucose-Capillary: 76 mg/dL (ref 70–99)

## 2022-08-28 LAB — HEPARIN LEVEL (UNFRACTIONATED): Heparin Unfractionated: 0.55 IU/mL (ref 0.30–0.70)

## 2022-08-28 MED ORDER — INSULIN DETEMIR 100 UNIT/ML ~~LOC~~ SOLN
10.0000 [IU] | Freq: Two times a day (BID) | SUBCUTANEOUS | Status: DC
  Administered 2022-08-28 – 2022-09-01 (×7): 10 [IU] via SUBCUTANEOUS
  Filled 2022-08-28 (×9): qty 0.1

## 2022-08-28 MED ORDER — ENOXAPARIN SODIUM 80 MG/0.8ML IJ SOSY
80.0000 mg | PREFILLED_SYRINGE | Freq: Two times a day (BID) | INTRAMUSCULAR | Status: DC
Start: 2022-08-28 — End: 2022-09-03
  Administered 2022-08-28 – 2022-09-03 (×13): 80 mg via SUBCUTANEOUS
  Filled 2022-08-28 (×13): qty 0.8

## 2022-08-28 MED ORDER — SODIUM CHLORIDE 0.9 % IV SOLN: INTRAVENOUS | Status: AC

## 2022-08-28 NOTE — Progress Notes (Addendum)
ANTICOAGULATION CONSULT NOTE - Follow Up Consult  Pharmacy Consult for Heparin > Lovenox Indication: atrial fibrillation  No Known Allergies  Patient Measurements: Height: '6\' 1"'$  (185.4 cm) Weight: 79.1 kg (174 lb 6.1 oz) IBW/kg (Calculated) : 79.9 Heparin and Lovenox Dosing Weight: 80 kg  Vital Signs: Temp: 98.2 F (36.8 C) (08/31 2300) Temp Source: Oral (08/31 2300) BP: 127/53 (09/01 0400) Pulse Rate: 65 (08/31 2300)  Labs: Recent Labs    08/26/22 0821 08/27/22 0433 08/27/22 1519 08/28/22 0746  HGB 10.5* 11.0*  --  10.5*  HCT 32.5* 34.7*  --  32.1*  PLT 311 316  --  277  HEPARINUNFRC 0.62 0.88* 0.67 0.55  CREATININE  --  1.33*  --  1.13     Estimated Creatinine Clearance: 59.3 mL/min (by C-G formula based on SCr of 1.13 mg/dL).  [name], no bolus per MD. Last dose of Eliquis was on 07/26/22 PTA.   Heparin level remains therapeutic (0.55) on 1300 units/hr. CBC stable.  Goal of Therapy:  Heparin level 0.3-0.7 units/ml Monitor platelets by anticoagulation protocol: Yes   Plan:  Continue heparin drip at 1300 units/hr Daily heparin level and CBC. PTA Eliquis on hold. Follow up for transition back to Eliquis when tolerating PO and no further procedures planned.  Addendum:  To transition to therapeutic Lovenox.  Lovenox 80 mg SQ q12h.  To stop IV heparin when giving first dose of Lovenox.   Arty Baumgartner, [place] 08/28/2022,10:04 AM

## 2022-08-28 NOTE — Procedures (Signed)
Referring Physician(s): Dr. Aileen Fass  Supervising Physician: Michaelle Birks  Patient Status:  [facility] - In-pt  Chief Complaint: Right hepatic abscess s/p drain placement 8/15 Drain exchange and upsize to 12 Fr on 8/24   Subjective:   Patient sitting in a chair, PT and spouse at bedside.  He reports no complaints today, spouse states that he has not been willing to eat much.  When asked if there is anything that is preventing him from eating (N/V,abd pain,) he states no.   Allergies: Patient has no known allergies.  Medications: Prior to Admission medications   Medication Sig Start Date End Date Taking? Authorizing Provider  acetaminophen (TYLENOL) 325 MG tablet Take 2 tablets (650 mg total) by mouth every 6 (six) hours as needed for mild pain (or Fever >/= 101). 04/30/22  Yes Sheikh, Omair Latif, DO  amiodarone (PACERONE) 200 MG tablet Take 1 tablet (200 mg total) by mouth daily. 05/29/22  Yes Loel Dubonnet, NP  atorvastatin (LIPITOR) 40 MG tablet Take 40 mg by mouth every evening.   Yes [provider]  lisinopril-hydrochlorothiazide (ZESTORETIC) 10-12.5 MG tablet Take 0.5 tablets by mouth daily.   Yes [provider]  metFORMIN (GLUCOPHAGE-XR) 500 MG 24 hr tablet Take 500-1,000 mg by mouth See admin instructions. Take 2 tablets (1000 mg) by mouth in the morning and 1 tablet (500 mg) every evening 03/17/22  Yes [provider]  metoprolol tartrate (LOPRESSOR) 25 MG tablet Take 1 tablet (25 mg total) by mouth 2 (two) times daily. 05/29/22  Yes Loel Dubonnet, NP  Multiple Vitamin (MULTIVITAMIN WITH MINERALS) TABS tablet Take 1 tablet by mouth daily.   Yes [provider]  ondansetron (ZOFRAN) 4 MG tablet Take 1 tablet (4 mg total) by mouth every 6 (six) hours as needed for nausea. 04/30/22  Yes Sheikh, Omair Latif, DO  pantoprazole (PROTONIX) 40 MG tablet Take 40 mg by mouth every morning.   Yes [provider]  polyethylene glycol powder  (GLYCOLAX/MIRALAX) 17 GM/SCOOP powder Take 17 g by mouth daily. Patient taking differently: Take 17 g by mouth daily as needed for mild constipation or moderate constipation. 04/30/22  Yes Sheikh, Omair Latif, DO  senna-docusate (SENOKOT-S) 8.6-50 MG tablet Take 1 tablet by mouth at bedtime. Patient taking differently: Take 1 tablet by mouth at bedtime as needed for mild constipation or moderate constipation. 04/30/22  Yes Sheikh, Omair Latif, DO  SPIRIVA RESPIMAT 2.5 MCG/ACT AERS Inhale 2 puffs into the lungs daily as needed (COPD). 06/04/22  Yes [provider]  tadalafil (CIALIS) 20 MG tablet Take 20 mg by mouth every three (3) days as needed for erectile dysfunction. 03/29/22  Yes [provider]  tamsulosin (FLOMAX) 0.4 MG CAPS capsule Take 0.4 mg by mouth daily after supper. 05/13/22  Yes [provider]  apixaban (ELIQUIS) 5 MG TABS tablet Take 1 tablet (5 mg total) by mouth 2 (two) times daily. Patient not taking: Reported on 07/31/2022 05/29/22   Loel Dubonnet, NP  oxyCODONE-acetaminophen (PERCOCET) 5-325 MG tablet Take 1 tablet by mouth every 4 (four) hours as needed for severe pain. Patient not taking: Reported on 07/31/2022 07/14/22 07/14/23  Norm Parcel, PA-C     Vital Signs: BP (!) 111/52   Pulse 72   Temp 97.7 F (36.5 C) (Oral)   Resp 13   Ht '6\' 1"'$  (1.854 m)   Wt 176 lb 12.9 oz (80.2 kg)   SpO2 100%   BMI 23.33 kg/m  Physical Exam Constitutional:      General: He is not in acute distress. Pulmonary:     Effort: Pulmonary effort is normal.  Abdominal:     Palpations: Abdomen is soft.     Comments: Positive RUQ drain to suction bulb. Site is unremarkable with no erythema, edema, tenderness, bleeding or drainage. Suture and stat lock in place. Dressing is clean, dry, and intact. 20 ml of  serosanguinous fluid noted in the bulb. Drain did not flush or drain at the beginning, but it flushes and aspirated well after vigorous irrigation.   Neurological:      Mental Status: He is alert and oriented to person, place, and time.  Psychiatric:        Mood and Affect: Mood normal.        Behavior: Behavior normal.     Imaging: No results found.  Labs:  CBC: Recent Labs    08/23/22 0317 08/24/22 0319 08/25/22 0250 08/26/22 0821  WBC 11.6* 11.6* 10.4 8.6  HGB 10.4* 10.6* 10.6* 10.5*  HCT 32.1* 32.1* 31.7* 32.5*  PLT 339 347 351 311    COAGS: Recent Labs    04/26/22 0843 08/01/22 1832 08/02/22 0703 08/11/22 0941  INR 1.3*  --   --  1.1  APTT  --  121* 73*  --     BMP: Recent Labs    08/20/22 0354 08/21/22 0334 08/23/22 0317 08/25/22 0250  NA 129* 131* 135 137  K 4.3 4.6 4.6 4.5  CL 93* 99 101 103  CO2 '29 28 26 27  '$ GLUCOSE 249* 233* 164* 88  BUN 45* 36* 32* 25*  CALCIUM 9.6 9.2 9.5 9.9  CREATININE 1.14 1.02 1.00 1.07  GFRNONAA >60 >60 >60 >60    LIVER FUNCTION TESTS: Recent Labs    08/01/22 0717 08/02/22 0703 08/03/22 0819 08/10/22 0747  BILITOT 1.1 1.0 1.0 0.7  AST '22 19 18 17  '$ ALT '23 20 17 16  '$ ALKPHOS 53 72 64 73  PROT 5.2* 5.5* 5.0* 5.4*  ALBUMIN 2.4* 2.3* 2.0* 1.8*    Assessment and Plan:  Right hepatic abscess s/p drain placement 8/15 Drain exchange and upsize to 12 Fr on 8/24   WBC elevated today, 15.0 (9.2 yesterday) Afebrile Cx showed NG  Output serosanguinous today.  Drain was hard to flush/aspirate at the beginning, flushes and aspirated well after vigorous irrigation.  RN unsure if the drain has been flushed TID, she will make sure it will be flushed TID.   Drain Location: RUQ Size: Fr size: 12 Fr Date of placement: initially placed on 8/15, upsized to 12 Fr on 8/24  Currently to: Drain collection device: suction bulb 24 hour output:  Output by Drain (mL) 08/26/22 0701 - 08/26/22 1900 08/26/22 1901 - 08/27/22 0700 08/27/22 0701 - 08/27/22 1900 08/27/22 1901 - 08/28/22 0700 08/28/22 0701 - 08/28/22 1534  Closed System Drain 1 Lateral;Right Abdomen 12 Fr.  2.5 10      Interval  imaging/drain manipulation:  8/23: CT AP  interval decrease in loculated perihepatic fluid collection after placement of percutaneous drain.  8/24: exchange and upsize to 12 Fr   Current examination: Flushes/aspirates easily.  Insertion site unremarkable. Suture and stat lock in place. Dressed appropriately.   Plan:  Discussed with Dr. Maryelizabeth Kaufmann as output has been low for couple days.  Will obtain CT AP IV cont only on Monday to eval if drain can be removed on Tuesday.  CT ordered.   Continue TID flushes with 5  cc NS. Record output Q shift. Dressing changes QD or PRN if soiled.  Call IR APP or on call IR MD if difficulty flushing or sudden change in drain output.  Repeat imaging/possible drain injection once output < 10 mL/QD (excluding flush material). Consideration for drain removal if output is < 10 mL/QD (excluding flush material), pending discussion with the providing surgical service.  Discharge planning: Please contact IR APP or on call IR MD prior to patient d/c to ensure appropriate follow up plans are in place. Typically patient will follow up with IR clinic 10-14 days post d/c for repeat imaging/possible drain injection. IR scheduler will contact patient with date/time of appointment. Patient will need to flush drain QD with 5 cc NS, record output QD, dressing changes every 2-3 days or earlier if soiled.   IR will continue to follow - please call with questions or concerns.    Electronically Signed: Soyla Dryer, AGACNP-BC [phone] 08/28/2022, 3:30 PM   I spent a total of 15 Minutes at the the patient's bedside AND on the patient's hospital floor or unit, greater than 50% of which was counseling/coordinating care for perihepatic abscess drain

## 2022-08-28 NOTE — Progress Notes (Addendum)
PROGRESS NOTE        PATIENT DETAILS Name: Douglas Hurst. Age: 81 y.o. Sex: male Date of Birth: 17-Aug-1941 Admit Date: 07/30/2022 Admitting Physician Clance Boll, MD HDQ:[id number], [name], MD  Brief Summary: Patient is a 81 y.o.  male with history of PAF on Eliquis, HTN, HLD, COPD, chronic HFrEF (recovered EF)-with recent history of cholecystitis-s/p cholecystectomy but with retained CBD stone.  ERCP here was unsuccessful.  Patient was referred to [facility] underwent advanced endoscopy via transduodenal approach for ERCP on 8/3 at [facility] with Dr. Okey Dupre -he subsequently presented to the ED with severe abdominal pain-was found to have pancreatitis and admitted to the hospitalist service.  Course was complicated by A-fib RVR, pseudocyst formation, subhepatic fluid collection requiring CT-guided drain placement.    Significant events: 8/3>> admit to [facility]-abdominal pain after ERCP at [facility]-found to have acute pancreatitis 8/5>> A-fib RVR-no response to IV Lopressor-blood pressure soft-amiodarone infusion started.  After discussion with GI-heparin started without bolus.  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 8/5>> CT abdomen/pelvis: Resolution of hypoenhancement of the pancreas-persistent inflammatory changes within the retroperitoneum surrounding the head of the pancreas extending into the porta hepatis.  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 8/13>> CT abdomen/pelvis: Increased size of large perihepatic pseudocysts 8/23>> CT abdomen/pelvis: Interval decrease in loculated perihepatic fluid collection after percutaneous drain placement.  Significant microbiology data: 8/15>> perihepatic  fluid culture: No growth  Procedures: 8/15>> CT-guided drain placement right subcapsular fluid collection. 8/16>>Cortak placed (removed 8/30) 8/24>> s/p exchange and size up of perihepatic drain.  Consults: GI Phone consult-cards-Dr Crenshaw IR Palliative care  Subjective: Tolerating advancement in diet-pain well controlled with current narcotic regimen.  Objective: Vitals: Blood pressure (!) 118/51, pulse 65, temperature 98.2 F (36.8 C), temperature source Oral, resp. rate 13, height '6\' 1"'$  (1.854 m), weight 79.1 kg, SpO2 95 %.   Exam: Gen Exam:Alert awake-not in any distress HEENT:atraumatic, normocephalic Chest: B/L clear to auscultation anteriorly CVS:S1S2 regular Abdomen:soft non tender, non distended Extremities:no edema Neurology: Non focal Skin: no rash   Pertinent Labs/Radiology:    Latest Ref Rng & Units 08/28/2022    7:46 AM 08/27/2022    4:33 AM 08/26/2022    8:21 AM  CBC  WBC 4.0 - 10.5 K/uL 15.0  9.2  8.6   Hemoglobin 13.0 - 17.0 g/dL 10.5  11.0  10.5   Hematocrit 39.0 - 52.0 % 32.1  34.7  32.5   Platelets 150 - 400 K/uL 277  316  311     Lab Results  Component Value Date   NA 135 08/28/2022   K 4.4 08/28/2022   CL 100 08/28/2022   CO2 27 08/28/2022      Assessment/Plan: Acute post ERCP pancreatitis with pseudocyst formation: Overall improved-NG tube removed 8/30-tolerating advancement in diet.  WBC up slightly today-he is clinically unchanged without any fever-follow for now.  Subhepatic space fluid collection: Likely due to leak from ERCP done via trans duodenal approach.  Drain in place-IR following.  PAF with RVR: Due to acute illness-maintaining sinus rhythm-on amiodarone-we will switch from IV heparin to SQ Lovenox today.  Once we assure that he would not require any further procedures-we will  transition to [place].  Acute hypoxic respiratory failure: Due to pleural effusion/atelectasis-improved-on room air.  Continue mobilization.  Encourage  incentive spirometry.  AKI: Resolved-likely hemodynamically mediated.  Acute on chronic HFrEF with recovered EF: Volume status stable-no longer on furosemide.  Normocytic anemia: Due to critical/acute illness-no evidence of blood loss.  Follow CBC periodically.  DM-2 (A1c 5.6 on 7/7): CBGs on the lower side-change Levemir to 10 units twice daily.  Continue SSI.  Resume metformin on discharge.  Recent Labs    08/27/22 2319 08/28/22 0442 08/28/22 0828  GLUCAP 124* 88 94     HTN: BP stable-continue metoprolol.  BPH: Continue Flomax  COPD: Continue bronchodilators  Infrarenal abdominal aortic aneurysm 4 mm: Unchanged from prior study-annual follow-up is required.  Debility/deconditioning: Due to acute illness-SNF planned on discharge.  Nutrition Status: Nutrition Problem: Severe Malnutrition Etiology: chronic illness (COPD, HF, acute pancreatitis) Signs/Symptoms: severe fat depletion, moderate fat depletion, severe muscle depletion, moderate muscle depletion Interventions: Ensure Enlive (each supplement provides 350kcal and 20 grams of protein), MVI    BMI: Estimated body mass index is 23.01 kg/m as calculated from the following:   Height as of this encounter: '6\' 1"'$  (1.854 m).   Weight as of this encounter: 79.1 kg.   Code status:   Code Status: Full Code   DVT Prophylaxis:IV heparin    Family Communication: None at bedside   Disposition Plan: Status is: Inpatient Remains inpatient appropriate because: Severe DeV[place]is-follow WBC count-likely SNF early next week.  Planned Discharge Destination:SNF   Diet: Diet Order             DIET DYS 3 Room service appropriate? Yes; Fluid consistency: Thin  Diet effective now                     Antimicrobial agents: Anti-infectives (From admission, onward)    Start     Dose/Rate Route Frequency Ordered Stop   08/02/22 1600  piperacillin-tazobactam (ZOSYN) IVPB 3.375 g  Status:  Discontinued        3.375  g 12.5 mL/hr over 240 Minutes Intravenous Every 8 hours 08/02/22 1039 08/13/22 1505   08/02/22 1130  piperacillin-tazobactam (ZOSYN) IVPB 4.5 g        4.5 g 200 mL/hr over 30 Minutes Intravenous  Once 08/02/22 1039 08/02/22 1240   07/31/22 0400  meropenem (MERREM) 1 g in sodium chloride 0.9 % 100 mL IVPB  Status:  Discontinued        1 g 200 mL/hr over 30 Minutes Intravenous Every 8 hours 07/31/22 0252 08/02/22 1039   07/30/22 2245  piperacillin-tazobactam (ZOSYN) IVPB 3.375 g  Status:  Discontinued        3.375 g 12.5 mL/hr over 240 Minutes Intravenous Every 8 hours 07/30/22 2239 07/31/22 0239        MEDICATIONS: Scheduled Meds:  acetaminophen  1,000 mg Oral TID   amiodarone  200 mg Oral Daily   enoxaparin (LOVENOX) injection  80 mg Subcutaneous Q12H   feeding supplement  237 mL Oral BID BM   fentaNYL  1 patch Transdermal Q72H   insulin aspart  0-9 Units Subcutaneous Q4H   insulin detemir  15 Units Subcutaneous BID   metoprolol tartrate  25 mg Oral BID   multivitamin with minerals  1 tablet Oral Daily   polyethylene glycol  17 g Oral BID   senna-docusate  1 tablet Oral Daily   sodium chloride flush  5 mL Intracatheter Q8H   tamsulosin  0.8 mg Oral  Daily   Continuous Infusions:  heparin 1,300 Units/hr (08/27/22 2141)   lactated ringers 50 mL/hr at 08/27/22 1140   PRN Meds:.ondansetron **OR** ondansetron (ZOFRAN) IV, oxyCODONE, umeclidinium bromide   I have personally reviewed following labs and imaging studies  LABORATORY DATA: CBC: Recent Labs  Lab 08/24/22 0319 08/25/22 0250 08/26/22 0821 08/27/22 0433 08/28/22 0746  WBC 11.6* 10.4 8.6 9.2 15.0*  HGB 10.6* 10.6* 10.5* 11.0* 10.5*  HCT 32.1* 31.7* 32.5* 34.7* 32.1*  MCV 89.4 89.5 91.8 91.8 90.4  PLT 347 351 311 316 277     Basic Metabolic Panel: Recent Labs  Lab 08/23/22 0317 08/25/22 0250 08/27/22 0433 08/28/22 0746  NA 135 137 137 135  K 4.6 4.5 4.4 4.4  CL 101 103 102 100  CO2 '26 27 29 27   '$ GLUCOSE 164* 88 84 101*  BUN 32* 25* 24* 23  CREATININE 1.00 1.07 1.33* 1.13  CALCIUM 9.5 9.9 9.9 9.7     GFR: Estimated Creatinine Clearance: 59.3 mL/min (by C-G formula based on SCr of 1.13 mg/dL).  Liver Function Tests: Recent Labs  Lab 08/27/22 0433  AST 19  ALT 29  ALKPHOS 72  BILITOT 0.5  PROT 6.3*  ALBUMIN 2.1*    No results for input(s): "LIPASE", "AMYLASE" in the last 168 hours.  No results for input(s): "AMMONIA" in the last 168 hours.  Coagulation Profile: No results for input(s): "INR", "PROTIME" in the last 168 hours.  Cardiac Enzymes: No results for input(s): "CKTOTAL", "CKMB", "CKMBINDEX", "TROPONINI" in the last 168 hours.  BNP (last 3 results) No results for input(s): "PROBNP" in the last 8760 hours.  Lipid Profile: No results for input(s): "CHOL", "HDL", "LDLCALC", "TRIG", "CHOLHDL", "LDLDIRECT" in the last 72 hours.  Thyroid Function Tests: No results for input(s): "TSH", "T4TOTAL", "FREET4", "T3FREE", "THYROIDAB" in the last 72 hours.  Anemia Panel: No results for input(s): "VITAMINB12", "FOLATE", "FERRITIN", "TIBC", "IRON", "RETICCTPCT" in the last 72 hours.  Urine analysis:    Component Value Date/Time   COLORURINE YELLOW 08/18/2022 1011   APPEARANCEUR CLOUDY (A) 08/18/2022 1011   LABSPEC 1.013 08/18/2022 1011   PHURINE 9.0 (H) 08/18/2022 1011   GLUCOSEU NEGATIVE 08/18/2022 [place] 08/18/2022 [place] 08/18/2022 1011   KETONESUR NEGATIVE 08/18/2022 1011   PROTEINUR NEGATIVE 08/18/2022 1011   UROBILINOGEN 4.0 (H) 10/02/2015 2135   NITRITE NEGATIVE 08/18/2022 1011   LEUKOCYTESUR NEGATIVE 08/18/2022 1011    Sepsis Labs: Lactic Acid, Venous    Component Value Date/Time   LATICACIDVEN 6.2 (HH) 07/31/2022 0528    MICROBIOLOGY: No results found for this or any previous visit (from the past 240 hour(s)).  RADIOLOGY STUDIES/RESULTS: No results found.   LOS: 28 days   Oren Binet, MD  [facility]    To contact the attending provider between 7A-7P or the covering provider during after hours 7P-7A, please log into the web site [email or web address] and access using universal [place] password for that web site. If you do not have the password, please call the hospital operator.  08/28/2022, 11:57 AM

## 2022-08-28 NOTE — TOC Progression Note (Signed)
Transition of Care (TOC) - Progression Note    Patient Details  Name: Douglas Hurst. MRN: [id number] Date of Birth: 1941-10-16  [facility] of [facility]) CM/SW [place], LCSW Phone Number: 08/28/2022, 9:24 AM  Clinical Narrative:    [place] still under in person review.    Expected Discharge Plan: Skilled Nursing Facility Barriers to Discharge: Ship broker, Continued Medical Work up, Environmental education officer)  Expected Discharge Plan and Services Expected Discharge Plan: [place] In-house Referral: Clinical Social Work   Post Acute Care Choice: [place] Living arrangements for the past 2 months: Single Family Home                                       Social Determinants of Health (SDOH) Interventions    Readmission Risk Interventions     No data to display

## 2022-08-28 NOTE — Progress Notes (Signed)
Physical Therapy Treatment Patient Details Name: Douglas Hurst. MRN: [id number] DOB: [birth year] Today's Date: 08/27/2022     History of Present Illness Patient is a 81 yo male presenting to the ED with severe abdominal pain with radiation to right shoulder on 07/31/22. Patient had underwent ECRP for removal of stones from biliary and pancreatic duct on 8/3. Admitted with acute pancreatitis. Patient with cortrak and Perihepatic drain placement. PMH includes: bipolar d/o DMII, HTN, HLD, GERD, COPD ,  Afib ,diastolic heart failure, AAA with interim history of acute cholecystitis.      PT Comments Pt received in supine, pt/spouse requesting PTA assist with repositioning for pressure relief as no nurse techs on floor and RN busy. Pt/spouse receptive to instruction on pressure relief frequency/strategies in bed vs chair and instruction on LE HEP frequency/technique. Pt and spouse shown handout in room and encouraged compliance with HEP and IS to maintain strength/endurance. Pt agreeable to mobilize OOB later in day once pressure relief geomat air cushion arrives to the room. Pt continues to benefit from PT services to progress toward functional mobility goals.      08/28/22 1143  PT Visit Information  Last PT Received On 08/28/22  Assistance Needed +1 (chair follow-gait)  History of Present Illness Patient is a 81 yo male presenting to the ED with severe abdominal pain with radiation to right shoulder on 07/31/22. Patient had underwent ECRP for removal of stones from biliary and pancreatic duct on 8/3. Admitted with acute pancreatitis. Patient with cortrak and Perihepatic drain placement. PMH includes: bipolar d/o DMII, HTN, HLD, GERD, COPD ,  Afib ,diastolic heart failure, AAA with interim history of acute cholecystitis.  Subjective Data  Patient Stated Goal to get stronger and pain reduced.  Precautions  Precautions Fall  Precaution Comments RUQ drain  Restrictions  Weight Bearing Restrictions No   Pain Assessment  Pain Assessment PAINAD  Faces Pain Scale 4  Breathing 1  Negative Vocalization 0  Facial Expression 2  Body Language 0  Consolability 1  PAINAD Score 4  Pain Location abdomen intermittent  Pain Descriptors / Indicators Grimacing  Pain Intervention(s) Monitored during session;Limited activity within patient's tolerance;Repositioned  Cognition  Arousal/Alertness Awake/alert  Behavior During Therapy Flat affect  Overall Cognitive Status Impaired/Different from baseline  Area of Impairment Problem solving;Awareness;Safety/judgement;Attention;Following commands  Current Attention Level Selective  Following Commands Follows one step commands with increased time  Safety/Judgement Decreased awareness of safety;Decreased awareness of deficits  Awareness Emergent  Problem Solving Requires verbal cues;Decreased initiation;Difficulty sequencing  General Comments Patient's wife present and assisted with encouraging patient to participate. Pt requesting to reposition in supine but asking to wait for OOB to chair until after cushion ordered for low back and bottom comfort given recent skin breakdown issues.  Bed Mobility  Overal bed mobility Needs Assistance  Bed Mobility Rolling  Rolling Min assist  General bed mobility comments posterior supine scooting toward HOB with modA via bed pad with bed in trendelenburg; pt encouraged to not pull with RUE overhead given RUQ drain and R flank pain and instead to push only with BLE but pt ignoring cues. Pt spouse present standing by but not needing to physically assist.  Transfers  General transfer comment pt defer until afternoon  Ambulation/Gait  Pre-gait activities pt defer  General Comments  General comments (skin integrity, edema, etc.) VSS on RA; reviewed pressure relief strategies and frequency in bed and chair postures and geomat cushion ordered from unit secretary; reviewed BLE HEP  Exercises  Exercises Other exercises   General Exercises - Lower Extremity  Heel Slides AROM;Both;5 reps;Supine  Ankle Circles/Pumps AROM;Both;Supine;10 reps  Quad Sets AROM;Both;5 reps;Supine  Hip ABduction/ADduction AROM;Both;5 reps;Supine  PT - End of Session  Equipment Utilized During Treatment Other (comment) (bed pad)  Activity Tolerance Patient limited by fatigue  Patient left in bed;with call bell/phone within reach;with bed alarm set;with family/visitor present;Other (comment) (bed in chair posture, heels floated)  Nurse Communication Mobility status;Other (comment) (pt needs air pressure relief cushion)   PT - Assessment/Plan  PT Plan Current plan remains appropriate  PT Visit Diagnosis Muscle weakness (generalized) (M62.81);Difficulty in walking, not elsewhere classified (R26.2);Other abnormalities of gait and mobility (R26.89);Pain  Pain - Right/Left Right  Pain - part of body  (flank/drain area)  PT Frequency (ACUTE ONLY) Min 2X/week  Follow Up Recommendations Skilled nursing-short term rehab (<3 hours/day)  Can patient physically be transported by private vehicle Yes  Assistance recommended at discharge Frequent or constant Supervision/Assistance  Patient can return home with the following A lot of help with walking and/or transfers;A lot of help with bathing/dressing/bathroom;Assistance with cooking/housework;Direct supervision/assist for medications management;Direct supervision/assist for financial management;Assist for transportation;Help with stairs or ramp for entrance  PT equipment None recommended by PT  AM-PAC PT "6 Clicks" Mobility Outcome Measure (Version 2)  Help needed turning from your back to your side while in a flat bed without using bedrails? 3  Help needed moving from lying on your back to sitting on the side of a flat bed without using bedrails? 2  Help needed moving to and from a bed to a chair (including a wheelchair)? 2  Help needed standing up from a chair using your arms (e.g.,  wheelchair or bedside chair)? 2  Help needed to walk in hospital room? 1  Help needed climbing 3-5 steps with a railing?  1  6 Click Score 11  Consider Recommendation of Discharge To: CIR/SNF/LTACH  Progressive Mobility  What is the highest level of mobility based on the progressive mobility assessment? Level 3 (Stands with assist) - Balance while standing  and cannot march in place  Activity Moved into chair position in bed  PT Goal Progression  Progress towards PT goals Progressing toward goals  Acute Rehab PT Goals  PT Goal Formulation With patient/family  Time For Goal Achievement 09/07/22  PT Time Calculation  PT Start Time (ACUTE ONLY) 1137  PT Stop Time (ACUTE ONLY) 1145  PT Time Calculation (min) (ACUTE ONLY) 8 min  PT General Charges  $$ ACUTE PT VISIT 1 Visit  PT Treatments  $Therapeutic Activity 8-22 mins   Nami Strawder P., PTA Acute Rehabilitation Services Secure Chat Preferred 9a-5:30pm Office: [phone]

## 2022-08-28 NOTE — Progress Notes (Addendum)
Physical Therapy Treatment Patient Details Name: Douglas Hurst. MRN: [id number] DOB: Dec 23, 1941 Today's Date: 08/28/2022   History of Present Illness Patient is a 81 yo male presenting to the ED with severe abdominal pain with radiation to right shoulder on 07/31/22. Patient had underwent ECRP for removal of stones from biliary and pancreatic duct on 8/3. Admitted with acute pancreatitis. Patient with cortrak and Perihepatic drain placement. PMH includes: bipolar d/o DMII, HTN, HLD, GERD, COPD ,  Afib ,diastolic heart failure, AAA with interim history of acute cholecystitis.    PT Comments    Pt received in supine, agreeable to therapy session now that air cushion for chair delivered to room, pt with good participation with encouragement. Standing tolerance limited due to symptomatic orthostatic hypotension (despite TED hose) after standing for 2 minutes, MD/RN notified. Pt needing up to modA for functional mobility tasks and only able to perform pre-gait tasks today due to fatigue and orthostatic hypotension. Pt spouse present and encouraging him to sit up in chair until after dinner. Orthostatic BPs Sitting 99/66 (75) after initial stand at bedside  Sitting after 5 min 126/92 (103); HR 66  Standing 98/49 (64); HR 74  Sitting with feet down in chair directly after standing BP taken  105/53 (68)   Pt continues to benefit from PT services to progress toward functional mobility goals.   Recommendations for follow up therapy are one component of a multi-disciplinary discharge planning process, led by the attending physician.  Recommendations may be updated based on patient status, additional functional criteria and insurance authorization.  Follow Up Recommendations  Skilled nursing-short term rehab (<3 hours/day) Can patient physically be transported by private vehicle: Yes   Assistance Recommended at Discharge Frequent or constant Supervision/Assistance  Patient can return home with the  following A lot of help with walking and/or transfers;A lot of help with bathing/dressing/bathroom;Assistance with cooking/housework;Direct supervision/assist for medications management;Direct supervision/assist for financial management;Assist for transportation;Help with stairs or ramp for entrance   Equipment Recommendations  None recommended by PT    Recommendations for Other Services       Precautions / Restrictions Precautions Precautions: Fall Precaution Comments: RUQ drain, orthostatic hypotension Restrictions Weight Bearing Restrictions: No     Mobility  Bed Mobility Overal bed mobility: Needs Assistance Bed Mobility: Sit to Sidelying, Rolling Rolling: Min assist Sidelying to sit: Mod assist      General bed mobility comments: cues for log roll to sit on L EOB; BLE assist, pt using bed rail    Transfers Overall transfer level: Needs assistance Equipment used: Rolling walker (2 wheels), 1 person hand held assist Transfers: Sit to/from Stand Sit to Stand: Mod assist, Min assist           General transfer comment: from EOB<>RW and RW<>chair, decreased eccentric control for stand>sit with symptoms of soft BP    Ambulation/Gait Ambulation/Gait assistance: Min assist, +2 safety/equipment Gait Distance (Feet): 4 Feet Assistive device: Rolling walker (2 wheels) Gait Pattern/deviations: Trunk flexed, Narrow base of support, Shuffle, Decreased stride length, Step-to pattern     Pre-gait activities: standing hip flexion x9 reps ea LE General Gait Details: assist to steady, guide RW, support trunk, pivotal steps to chair, orthostatic symptoms limiting gait distance; pre-gait tasks as below      Balance Overall balance assessment: Needs assistance Sitting-balance support: Feet supported, Bilateral upper extremity supported Sitting balance-Leahy Scale: Fair     Standing balance support: During functional activity, Reliant on assistive device for balance, Bilateral  upper  extremity supported, Single extremity supported Standing balance-Leahy Scale: Poor Standing balance comment: heavily reliant on RW                            Cognition Arousal/Alertness: Awake/alert Behavior During Therapy: Flat affect Overall Cognitive Status: Impaired/Different from baseline Area of Impairment: Problem solving, Awareness, Safety/judgement, Attention, Following commands                   Current Attention Level: Selective   Following Commands: Follows one step commands with increased time Safety/Judgement: Decreased awareness of safety, Decreased awareness of deficits Awareness: Emergent Problem Solving: Requires verbal cues, Decreased initiation, Difficulty sequencing General Comments: Patient's wife present and assisted with encouraging patient to participate. Pt following simple 1-step commands with poor carryover of safety cues within session.        Exercises General Exercises - Lower Extremity Ankle Circles/Pumps: AROM, Both, Supine, 10 reps Quad Sets: AROM, Both, 5 reps, Supine Heel Slides: AROM, Both, 5 reps, Supine Hip ABduction/ADduction: AROM, Both, 5 reps, Supine    General Comments General comments (skin integrity, edema, etc.): see BP in comments above, MD/RN notified; TED hose in place throughout; SpO2/HR [facility]; geomat cushion in place in chair and chair alarm on for safety; spouse in room      Pertinent Vitals/Pain Pain Assessment Pain Assessment: PAINAD Faces Pain Scale: Hurts little more Breathing: occasional labored breathing, short period of hyperventilation Negative Vocalization: none Facial Expression: facial grimacing Body Language: relaxed Consolability: distracted or reassured by voice/touch PAINAD Score: 4 Pain Location: abdomen intermittent Pain Descriptors / Indicators: Grimacing Pain Intervention(s): Monitored during session, Repositioned, Limited activity within patient's tolerance (pressure relief air  geomat cushion in recliner for comfort (delivered to room between sessions this date))     PT Goals (current goals can now be found in the care plan section) Acute Rehab PT Goals Patient Stated Goal: to get stronger and pain reduced. PT Goal Formulation: With patient/family Time For Goal Achievement: 09/07/22 Progress towards PT goals: Progressing toward goals    Frequency    Min 2X/week      PT Plan Current plan remains appropriate       AM-PAC PT "6 Clicks" Mobility   Outcome Measure  Help needed turning from your back to your side while in a flat bed without using bedrails?: A Little Help needed moving from lying on your back to sitting on the side of a flat bed without using bedrails?: A Lot Help needed moving to and from a bed to a chair (including a wheelchair)?: A Lot Help needed standing up from a chair using your arms (e.g., wheelchair or bedside chair)?: A Lot Help needed to walk in hospital room?: Total Help needed climbing 3-5 steps with a railing? : Total 6 Click Score: 11    End of Session Equipment Utilized During Treatment: Gait belt Activity Tolerance: Patient tolerated treatment well;Other (comment);Treatment limited secondary to medical complications (Comment) (orthostatic hypotension despite TED hose) Patient left: with call bell/phone within reach;with family/visitor present;in chair;with chair alarm set (geomat cushion under him in chair; radiology PA entering room to check drain) Nurse Communication: Mobility status;Precautions;Other (comment) (OH, geomat cushion) PT Visit Diagnosis: Muscle weakness (generalized) (M62.81);Difficulty in walking, not elsewhere classified (R26.2);Other abnormalities of gait and mobility (R26.89);Pain Pain - Right/Left: Right Pain - part of body:  (flank/drain area)     Time: 1950-9326 PT Time Calculation (min) (ACUTE ONLY): 30 min  Charges:  $Gait  Training: 8-22 mins $Therapeutic Activity: 8-22 mins                      Tangee Marszalek P., PTA Acute Rehabilitation Services Secure Chat Preferred 9a-5:30pm Office: [place] 08/28/2022, 3:45 PM

## 2022-08-28 NOTE — Progress Notes (Signed)
Subjective: Reports less pain. Was able to perform physical therapy yesterday. Tolerating low-fat diet. Last bowel movement 2 days ago.  Objective: Vital signs in last 24 hours: Temp:  [97.7 F (36.5 C)-98.4 F (36.9 C)] 98.2 F (36.8 C) (08/31 2300) Pulse Rate:  [65-70] 65 (08/31 2300) Resp:  [12-19] 18 (09/01 0400) BP: (115-129)/(50-65) 127/53 (09/01 0400) SpO2:  [91 %-97 %] 95 % (08/31 2300) Weight:  [79.1 kg] 79.1 kg (09/01 0343) Weight change:  Last BM Date : 08/25/22  PE: Appears comfortable, sitting up on bed GENERAL: No icterus, mild pallor ABDOMEN: Right upper quadrant drain with minimal bilious output, otherwise nontender abdomen with normoactive bowel sounds EXTREMITIES: No deformity, no edema  Lab Results: Results for orders placed or performed during the hospital encounter of 07/30/22 (from the past 48 hour(s))  Glucose, capillary     Status: Abnormal   Collection Time: 08/26/22 11:21 AM  Result Value Ref Range   Glucose-Capillary 162 (H) 70 - 99 mg/dL    Comment: Glucose reference range applies only to samples taken after fasting for at least 8 hours.  Glucose, capillary     Status: Abnormal   Collection Time: 08/26/22  4:11 PM  Result Value Ref Range   Glucose-Capillary 164 (H) 70 - 99 mg/dL    Comment: Glucose reference range applies only to samples taken after fasting for at least 8 hours.  Glucose, capillary     Status: None   Collection Time: 08/26/22  7:48 PM  Result Value Ref Range   Glucose-Capillary 95 70 - 99 mg/dL    Comment: Glucose reference range applies only to samples taken after fasting for at least 8 hours.  Glucose, capillary     Status: None   Collection Time: 08/26/22 11:49 PM  Result Value Ref Range   Glucose-Capillary 96 70 - 99 mg/dL    Comment: Glucose reference range applies only to samples taken after fasting for at least 8 hours.  Heparin level (unfractionated)     Status: Abnormal   Collection Time: 08/27/22  4:33 AM  Result  Value Ref Range   Heparin Unfractionated 0.88 (H) 0.30 - 0.70 IU/mL    Comment: (NOTE) The clinical reportable range upper limit is being lowered to >1.10 to align with the FDA approved guidance for the current laboratory assay.  If heparin results are below expected values, and patient dosage has  been confirmed, suggest follow up testing of antithrombin III levels. Performed at [facility], [place] [address], [place], [place] [address]   CBC     Status: Abnormal   Collection Time: 08/27/22  4:33 AM  Result Value Ref Range   WBC 9.2 4.0 - 10.5 K/uL   RBC 3.78 (L) 4.22 - 5.81 MIL/uL   Hemoglobin 11.0 (L) 13.0 - 17.0 g/dL   HCT 34.7 (L) 39.0 - 52.0 %   MCV 91.8 80.0 - 100.0 fL   MCH 29.1 26.0 - 34.0 pg   MCHC 31.7 30.0 - 36.0 g/dL   RDW 16.3 (H) 11.5 - 15.5 %   Platelets 316 150 - 400 K/uL   nRBC 0.0 0.0 - 0.2 %    Comment: Performed at [facility], [place] [address] [place], [place] [address]  Comprehensive metabolic panel     Status: Abnormal   Collection Time: 08/27/22  4:33 AM  Result Value Ref Range   Sodium 137 135 - 145 mmol/L   Potassium 4.4 3.5 - 5.1 mmol/L   Chloride 102  98 - 111 mmol/L   CO2 29 22 - 32 mmol/L   Glucose, Bld 84 70 - 99 mg/dL    Comment: Glucose reference range applies only to samples taken after fasting for at least 8 hours.   BUN 24 (H) 8 - 23 mg/dL   Creatinine, Ser 1.33 (H) 0.61 - 1.24 mg/dL   Calcium 9.9 8.9 - 10.3 mg/dL   Total Protein 6.3 (L) 6.5 - 8.1 g/dL   Albumin 2.1 (L) 3.5 - 5.0 g/dL   AST 19 15 - 41 U/L   ALT 29 0 - 44 U/L   Alkaline Phosphatase 72 38 - 126 U/L   Total Bilirubin 0.5 0.3 - 1.2 mg/dL   GFR, Estimated 54 (L) >60 mL/min    Comment: (NOTE) Calculated using the CKD-EPI Creatinine Equation (2021)    Anion gap 6 5 - 15    Comment: Performed at [facility], [place] [address], [place], [place] [address]  Glucose, capillary     Status: None   Collection Time: 08/27/22  7:53 AM  Result Value Ref Range    Glucose-Capillary 88 70 - 99 mg/dL    Comment: Glucose reference range applies only to samples taken after fasting for at least 8 hours.  Glucose, capillary     Status: Abnormal   Collection Time: 08/27/22 11:52 AM  Result Value Ref Range   Glucose-Capillary 140 (H) 70 - 99 mg/dL    Comment: Glucose reference range applies only to samples taken after fasting for at least 8 hours.  Heparin level (unfractionated)     Status: None   Collection Time: 08/27/22  3:19 PM  Result Value Ref Range   Heparin Unfractionated 0.67 0.30 - 0.70 IU/mL    Comment: (NOTE) The clinical reportable range upper limit is being lowered to >1.10 to align with the FDA approved guidance for the current laboratory assay.  If heparin results are below expected values, and patient dosage has  been confirmed, suggest follow up testing of antithrombin III levels. Performed at [facility], [place] [address], [place], [place] [address]   Glucose, capillary     Status: Abnormal   Collection Time: 08/27/22  4:04 PM  Result Value Ref Range   Glucose-Capillary 170 (H) 70 - 99 mg/dL    Comment: Glucose reference range applies only to samples taken after fasting for at least 8 hours.  Glucose, capillary     Status: Abnormal   Collection Time: 08/27/22  7:45 PM  Result Value Ref Range   Glucose-Capillary 136 (H) 70 - 99 mg/dL    Comment: Glucose reference range applies only to samples taken after fasting for at least 8 hours.  Glucose, capillary     Status: Abnormal   Collection Time: 08/27/22 11:19 PM  Result Value Ref Range   Glucose-Capillary 124 (H) 70 - 99 mg/dL    Comment: Glucose reference range applies only to samples taken after fasting for at least 8 hours.  Glucose, capillary     Status: None   Collection Time: 08/28/22  4:42 AM  Result Value Ref Range   Glucose-Capillary 88 70 - 99 mg/dL    Comment: Glucose reference range applies only to samples taken after fasting for at least 8 hours.  Heparin  level (unfractionated)     Status: None   Collection Time: 08/28/22  7:46 AM  Result Value Ref Range   Heparin Unfractionated 0.55 0.30 - 0.70 IU/mL    Comment: (NOTE) The clinical  reportable range upper limit is being lowered to >1.10 to align with the FDA approved guidance for the current laboratory assay.  If heparin results are below expected values, and patient dosage has  been confirmed, suggest follow up testing of antithrombin III levels. Performed at [facility], [place] [address] [place], [place] [address]   CBC     Status: Abnormal   Collection Time: 08/28/22  7:46 AM  Result Value Ref Range   WBC 15.0 (H) 4.0 - 10.5 K/uL   RBC 3.55 (L) 4.22 - 5.81 MIL/uL   Hemoglobin 10.5 (L) 13.0 - 17.0 g/dL   HCT 32.1 (L) 39.0 - 52.0 %   MCV 90.4 80.0 - 100.0 fL   MCH 29.6 26.0 - 34.0 pg   MCHC 32.7 30.0 - 36.0 g/dL   RDW 16.2 (H) 11.5 - 15.5 %   Platelets 277 150 - 400 K/uL   nRBC 0.0 0.0 - 0.2 %    Comment: Performed at [facility], [place] [address], [place], [place] [address]  Basic metabolic panel     Status: Abnormal   Collection Time: 08/28/22  7:46 AM  Result Value Ref Range   Sodium 135 135 - 145 mmol/L   Potassium 4.4 3.5 - 5.1 mmol/L   Chloride 100 98 - 111 mmol/L   CO2 27 22 - 32 mmol/L   Glucose, Bld 101 (H) 70 - 99 mg/dL    Comment: Glucose reference range applies only to samples taken after fasting for at least 8 hours.   BUN 23 8 - 23 mg/dL   Creatinine, Ser 1.13 0.61 - 1.24 mg/dL   Calcium 9.7 8.9 - 10.3 mg/dL   GFR, Estimated >60 >60 mL/min    Comment: (NOTE) Calculated using the CKD-EPI Creatinine Equation (2021)    Anion gap 8 5 - 15    Comment: Performed at [place] [address], [place], [place] [address]  Glucose, capillary     Status: None   Collection Time: 08/28/22  8:28 AM  Result Value Ref Range   Glucose-Capillary 94 70 - 99 mg/dL    Comment: Glucose reference range applies only to samples taken after fasting for at  least 8 hours.    Studies/Results: No results found.  Medications: I have reviewed the patient's current medications.  Assessment: Resolving pancreatitis, status post ERCP Pancreatic pseudocysts Perihepatic fluid collection, drain output has dramatically decreased to 10 cc in the last 24 hours Constipation likely related to use of narcotics and reduced mobility-on senna and MiraLAX  Paroxysmal atrial fibrillation with RVR Acute kidney injury-resolved with IV fluids for 24 hours Acute on chronic heart failure Normocytic anemia Infrarenal abdominal aortic aneurysm  Plan: Doing better each day, able to maintain oral nutrition and participating in physical therapy. Mild elevation in WBC count noted without worsening clinical symptoms of fever, will monitor. We will continue to follow.  Ronnette Juniper, MD 08/28/2022, 9:51 AM

## 2022-08-29 DIAGNOSIS — E43 Unspecified severe protein-calorie malnutrition: Secondary | ICD-10-CM | POA: Diagnosis not present

## 2022-08-29 DIAGNOSIS — K858 Other acute pancreatitis without necrosis or infection: Secondary | ICD-10-CM | POA: Diagnosis not present

## 2022-08-29 DIAGNOSIS — I10 Essential (primary) hypertension: Secondary | ICD-10-CM | POA: Diagnosis not present

## 2022-08-29 DIAGNOSIS — N179 Acute kidney failure, unspecified: Secondary | ICD-10-CM | POA: Diagnosis not present

## 2022-08-29 LAB — GLUCOSE, CAPILLARY
Glucose-Capillary: 101 mg/dL — ABNORMAL HIGH (ref 70–99)
Glucose-Capillary: 109 mg/dL — ABNORMAL HIGH (ref 70–99)
Glucose-Capillary: 112 mg/dL — ABNORMAL HIGH (ref 70–99)
Glucose-Capillary: 120 mg/dL — ABNORMAL HIGH (ref 70–99)
Glucose-Capillary: 72 mg/dL (ref 70–99)
Glucose-Capillary: 76 mg/dL (ref 70–99)
Glucose-Capillary: 79 mg/dL (ref 70–99)
Glucose-Capillary: 92 mg/dL (ref 70–99)

## 2022-08-29 LAB — CBC
HCT: 33.2 % — ABNORMAL LOW (ref 39.0–52.0)
Hemoglobin: 11.2 g/dL — ABNORMAL LOW (ref 13.0–17.0)
MCH: 30.2 pg (ref 26.0–34.0)
MCHC: 33.7 g/dL (ref 30.0–36.0)
MCV: 89.5 fL (ref 80.0–100.0)
Platelets: 254 10*3/uL (ref 150–400)
RBC: 3.71 MIL/uL — ABNORMAL LOW (ref 4.22–5.81)
RDW: 16.2 % — ABNORMAL HIGH (ref 11.5–15.5)
WBC: 10.9 10*3/uL — ABNORMAL HIGH (ref 4.0–10.5)
nRBC: 0 % (ref 0.0–0.2)

## 2022-08-29 MED ORDER — GUAIFENESIN-DM 100-10 MG/5ML PO SYRP
5.0000 mL | ORAL_SOLUTION | ORAL | Status: DC | PRN
Start: 2022-08-29 — End: 2022-09-08
  Administered 2022-08-29: 5 mL via ORAL
  Filled 2022-08-29: qty 5

## 2022-08-29 NOTE — Progress Notes (Signed)
PROGRESS NOTE        PATIENT DETAILS Name: Douglas Hurst. Age: 81 y.o. Sex: male Date of Birth: 04-28-1941 Admit Date: 07/30/2022 Admitting Physician Clance Boll, MD [id number], [name], MD  Brief Summary: Patient is a 81 y.o.  male with history of PAF on Eliquis, HTN, HLD, COPD, chronic HFrEF (recovered EF)-with recent history of cholecystitis-s/p cholecystectomy but with retained CBD stone.  ERCP here was unsuccessful.  Patient was referred to [facility] underwent advanced endoscopy via transduodenal approach for ERCP on 8/3 at [facility] with Dr. Okey Dupre -he subsequently presented to the ED with severe abdominal pain-was found to have pancreatitis and admitted to the hospitalist service.  Course was complicated by A-fib RVR, pseudocyst formation, subhepatic fluid collection requiring CT-guided drain placement.    Significant events: 8/3>> admit to [facility]-abdominal pain after ERCP at [facility]-found to have acute pancreatitis 8/5>> A-fib RVR-no response to IV Lopressor-blood pressure soft-amiodarone infusion started.  After discussion with GI-heparin started without bolus.  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 8/5>> CT abdomen/pelvis: Resolution of hypoenhancement of the pancreas-persistent inflammatory changes within the retroperitoneum surrounding the head of the pancreas extending into the porta hepatis.  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 8/13>> CT abdomen/pelvis: Increased size of large perihepatic pseudocysts 8/23>> CT abdomen/pelvis: Interval decrease in loculated perihepatic fluid collection after percutaneous drain placement.  Significant microbiology data: 8/15>> perihepatic  fluid culture: No growth  Procedures: 8/15>> CT-guided drain placement right subcapsular fluid collection. 8/16>>Cortak placed (removed 8/30) 8/24>> s/p exchange and size up of perihepatic drain.  Consults: GI Phone consult-cards-Dr Crenshaw IR Palliative care  Subjective: No major issues overnight.  Lying comfortably in bed.  Objective: Vitals: Blood pressure (!) 158/79, pulse 74, temperature 98 F (36.7 C), temperature source Oral, resp. rate (!) 24, height '6\' 1"'$  (1.854 m), weight 79 kg, SpO2 93 %.   Exam: Gen Exam:Alert awake-not in any distress HEENT:atraumatic, normocephalic Chest: B/L clear to auscultation anteriorly CVS:S1S2 regular Abdomen:soft no major tenderness observed today. Extremities:no edema Neurology: Non focal Skin: no rash   Pertinent Labs/Radiology:    Latest Ref Rng & Units 08/29/2022    5:38 AM 08/28/2022    7:46 AM 08/27/2022    4:33 AM  CBC  WBC 4.0 - 10.5 K/uL 10.9  15.0  9.2   Hemoglobin 13.0 - 17.0 g/dL 11.2  10.5  11.0   Hematocrit 39.0 - 52.0 % 33.2  32.1  34.7   Platelets 150 - 400 K/uL 254  277  316     Lab Results  Component Value Date   NA 135 08/28/2022   K 4.4 08/28/2022   CL 100 08/28/2022   CO2 27 08/28/2022      Assessment/Plan: Acute post ERCP pancreatitis with pseudocyst formation: Overall improved-NG tube removed 8/30-tolerating advancement in diet.  WBC starting to trend down-continue supportive care.  IR planning to repeat CT on 9/4 to assess subhepatic fluid collection.  Subhepatic space fluid collection: Likely due to leak from ERCP done via trans duodenal approach.  Drain in place-IR following.  Repeat CT planned on 9/4.  PAF with RVR: Due to acute illness-maintaining sinus rhythm-on amiodarone-we will switch from IV heparin to SQ  Lovenox today.  Once we assure that he would not require any further procedures-we will transition to [place].  Acute hypoxic respiratory failure: Due to pleural  effusion/atelectasis-improved-on room air.  Continue mobilization.  Encourage incentive spirometry.  AKI: Resolved-likely hemodynamically mediated.  Acute on chronic HFrEF with recovered EF: Volume status stable-no longer on furosemide.  Orthostatic hypotension: Noted on 9/1 with PT-gently hydrated.  Will need to repeat orthostatic vitals over the next few days.  Normocytic anemia: Due to critical/acute illness-no evidence of blood loss.  Follow CBC periodically.  DM-2 (A1c 5.6 on 7/7): CBGs on the lower side-change Levemir to 10 units twice daily.  Continue SSI.  Resume metformin on discharge.  Recent Labs    08/28/22 2326 08/29/22 0406 08/29/22 0748  GLUCAP 76 72 76     HTN: BP stable-continue metoprolol.  BPH: Continue Flomax  COPD: Continue bronchodilators  Infrarenal abdominal aortic aneurysm 4 mm: Unchanged from prior study-annual follow-up is required.  Debility/deconditioning: Due to acute illness-SNF planned on discharge.  Nutrition Status: Nutrition Problem: Severe Malnutrition Etiology: chronic illness (COPD, HF, acute pancreatitis) Signs/Symptoms: severe fat depletion, moderate fat depletion, severe muscle depletion, moderate muscle depletion Interventions: Ensure Enlive (each supplement provides 350kcal and 20 grams of protein), MVI    BMI: Estimated body mass index is 22.98 kg/m as calculated from the following:   Height as of this encounter: '6\' 1"'$  (1.854 m).   Weight as of this encounter: 79 kg.   Code status:   Code Status: Full Code   DVT Prophylaxis:IV heparin    Family Communication: Spouse at bedside.   Disposition Plan: Status is: Inpatient Remains inpatient appropriate because: Severe pancreatitis-subhepatic collection-drain in place-repeat CT planned for 9/4-SNF plan-awaiting insurance authorization  Planned Discharge Destination:SNF likely early next week.   Diet: Diet Order             DIET DYS 3 Room service appropriate? Yes;  Fluid consistency: Thin  Diet effective now                     Antimicrobial agents: Anti-infectives (From admission, onward)    Start     Dose/Rate Route Frequency Ordered Stop   08/02/22 1600  piperacillin-tazobactam (ZOSYN) IVPB 3.375 g  Status:  Discontinued        3.375 g 12.5 mL/hr over 240 Minutes Intravenous Every 8 hours 08/02/22 1039 08/13/22 1505   08/02/22 1130  piperacillin-tazobactam (ZOSYN) IVPB 4.5 g        4.5 g 200 mL/hr over 30 Minutes Intravenous  Once 08/02/22 1039 08/02/22 1240   07/31/22 0400  meropenem (MERREM) 1 g in sodium chloride 0.9 % 100 mL IVPB  Status:  Discontinued        1 g 200 mL/hr over 30 Minutes Intravenous Every 8 hours 07/31/22 0252 08/02/22 1039   07/30/22 2245  piperacillin-tazobactam (ZOSYN) IVPB 3.375 g  Status:  Discontinued        3.375 g 12.5 mL/hr over 240 Minutes Intravenous Every 8 hours 07/30/22 2239 07/31/22 0239        MEDICATIONS: Scheduled Meds:  acetaminophen  1,000 mg Oral TID   amiodarone  200 mg Oral Daily   enoxaparin (LOVENOX) injection  80 mg Subcutaneous Q12H   feeding supplement  237 mL Oral BID BM   fentaNYL  1 patch Transdermal Q72H   insulin aspart  0-9 Units Subcutaneous Q4H   insulin detemir  10 Units Subcutaneous BID   metoprolol tartrate  25 mg Oral BID  multivitamin with minerals  1 tablet Oral Daily   polyethylene glycol  17 g Oral BID   senna-docusate  1 tablet Oral Daily   sodium chloride flush  5 mL Intracatheter Q8H   tamsulosin  0.8 mg Oral Daily   Continuous Infusions:   PRN Meds:.guaiFENesin-dextromethorphan, ondansetron **OR** ondansetron (ZOFRAN) IV, oxyCODONE, umeclidinium bromide   I have personally reviewed following labs and imaging studies  LABORATORY DATA: CBC: Recent Labs  Lab 08/25/22 0250 08/26/22 0821 08/27/22 0433 08/28/22 0746 08/29/22 0538  WBC 10.4 8.6 9.2 15.0* 10.9*  HGB 10.6* 10.5* 11.0* 10.5* 11.2*  HCT 31.7* 32.5* 34.7* 32.1* 33.2*  MCV 89.5 91.8  91.8 90.4 89.5  PLT 351 311 316 277 254     Basic Metabolic Panel: Recent Labs  Lab 08/23/22 0317 08/25/22 0250 08/27/22 0433 08/28/22 0746  NA 135 137 137 135  K 4.6 4.5 4.4 4.4  CL 101 103 102 100  CO2 '26 27 29 27  '$ GLUCOSE 164* 88 84 101*  BUN 32* 25* 24* 23  CREATININE 1.00 1.07 1.33* 1.13  CALCIUM 9.5 9.9 9.9 9.7     GFR: Estimated Creatinine Clearance: 59.2 mL/min (by C-G formula based on SCr of 1.13 mg/dL).  Liver Function Tests: Recent Labs  Lab 08/27/22 0433  AST 19  ALT 29  ALKPHOS 72  BILITOT 0.5  PROT 6.3*  ALBUMIN 2.1*    No results for input(s): "LIPASE", "AMYLASE" in the last 168 hours.  No results for input(s): "AMMONIA" in the last 168 hours.  Coagulation Profile: No results for input(s): "INR", "PROTIME" in the last 168 hours.  Cardiac Enzymes: No results for input(s): "CKTOTAL", "CKMB", "CKMBINDEX", "TROPONINI" in the last 168 hours.  BNP (last 3 results) No results for input(s): "PROBNP" in the last 8760 hours.  Lipid Profile: No results for input(s): "CHOL", "HDL", "LDLCALC", "TRIG", "CHOLHDL", "LDLDIRECT" in the last 72 hours.  Thyroid Function Tests: No results for input(s): "TSH", "T4TOTAL", "FREET4", "T3FREE", "THYROIDAB" in the last 72 hours.  Anemia Panel: No results for input(s): "VITAMINB12", "FOLATE", "FERRITIN", "TIBC", "IRON", "RETICCTPCT" in the last 72 hours.  Urine analysis:    Component Value Date/Time   COLORURINE YELLOW 08/18/2022 1011   APPEARANCEUR CLOUDY (A) 08/18/2022 1011   LABSPEC 1.013 08/18/2022 1011   PHURINE 9.0 (H) 08/18/2022 1011   GLUCOSEU NEGATIVE 08/18/2022 [place] 08/18/2022 [place] 08/18/2022 1011   KETONESUR NEGATIVE 08/18/2022 1011   PROTEINUR NEGATIVE 08/18/2022 1011   UROBILINOGEN 4.0 (H) 10/02/2015 2135   NITRITE NEGATIVE 08/18/2022 1011   LEUKOCYTESUR NEGATIVE 08/18/2022 1011    Sepsis Labs: Lactic Acid, Venous    Component Value Date/Time    LATICACIDVEN 6.2 (HH) 07/31/2022 0528    MICROBIOLOGY: No results found for this or any previous visit (from the past 240 hour(s)).  RADIOLOGY STUDIES/RESULTS: No results found.   LOS: 29 days   Oren Binet, MD  [facility]    To contact the attending provider between 7A-7P or the covering provider during after hours 7P-7A, please log into the web site [email or web address] and access using universal [place] password for that web site. If you do not have the password, please call the hospital operator.  08/29/2022, 11:28 AM

## 2022-08-29 NOTE — Progress Notes (Signed)
[facility] Gastroenterology Progress Note  Giannis Corpuz. 81 y.o. Jul 18, 1941   Subjective: Sitting up in chair. Tolerating soft diet without worsened abdominal pain/N/V. Wife in room.  Objective: Vital signs: Vitals:   08/29/22 0749 08/29/22 1218  BP: (!) 158/79 119/64  Pulse:    Resp:    Temp: 98 F (36.7 C) 98 F (36.7 C)  SpO2:    P 74, R 24  Physical Exam: Gen: elderly, chronically ill-appearing, thin, no acute distress  HEENT: anicteric sclera CV: RRR Chest: CTA B Abd: mild diffuse tenderness with minimal guarding, soft, nondistended, +BS Ext: no edema  Lab Results: Recent Labs    08/27/22 0433 08/28/22 0746  NA 137 135  K 4.4 4.4  CL 102 100  CO2 29 27  GLUCOSE 84 101*  BUN 24* 23  CREATININE 1.33* 1.13  CALCIUM 9.9 9.7   Recent Labs    08/27/22 0433  AST 19  ALT 29  ALKPHOS 72  BILITOT 0.5  PROT 6.3*  ALBUMIN 2.1*   Recent Labs    08/28/22 0746 08/29/22 0538  WBC 15.0* 10.9*  HGB 10.5* 11.2*  HCT 32.1* 33.2*  MCV 90.4 89.5  PLT 277 254   30 cc output from drain documented since 10 cc recorded 08/27/22   Assessment/Plan: Complicated pancreatitis with pseudocyst and perihepatic fluid collection with drain in place. Eating better. Supportive care. No new GI recs. Hopefully drain will be removed by radiology in near future. [facility] will sign off. Call if questions.   Lear Ng 08/29/2022, 2:48 PM  Questions please call [phone] Patient ID: Leevi Cullars., male   DOB: 02-15-1941, 81 y.o.   MRN: [id number]

## 2022-08-30 DIAGNOSIS — I10 Essential (primary) hypertension: Secondary | ICD-10-CM | POA: Diagnosis not present

## 2022-08-30 DIAGNOSIS — K858 Other acute pancreatitis without necrosis or infection: Secondary | ICD-10-CM | POA: Diagnosis not present

## 2022-08-30 DIAGNOSIS — N179 Acute kidney failure, unspecified: Secondary | ICD-10-CM | POA: Diagnosis not present

## 2022-08-30 DIAGNOSIS — E43 Unspecified severe protein-calorie malnutrition: Secondary | ICD-10-CM | POA: Diagnosis not present

## 2022-08-30 LAB — CBC
HCT: 31.5 % — ABNORMAL LOW (ref 39.0–52.0)
Hemoglobin: 10.6 g/dL — ABNORMAL LOW (ref 13.0–17.0)
MCH: 30.1 pg (ref 26.0–34.0)
MCHC: 33.7 g/dL (ref 30.0–36.0)
MCV: 89.5 fL (ref 80.0–100.0)
Platelets: 276 10*3/uL (ref 150–400)
RBC: 3.52 MIL/uL — ABNORMAL LOW (ref 4.22–5.81)
RDW: 16.3 % — ABNORMAL HIGH (ref 11.5–15.5)
WBC: 6.8 10*3/uL (ref 4.0–10.5)
nRBC: 0 % (ref 0.0–0.2)

## 2022-08-30 LAB — GLUCOSE, CAPILLARY
Glucose-Capillary: 71 mg/dL (ref 70–99)
Glucose-Capillary: 94 mg/dL (ref 70–99)
Glucose-Capillary: 123 mg/dL — ABNORMAL HIGH (ref 70–99)
Glucose-Capillary: 102 mg/dL — ABNORMAL HIGH (ref 70–99)
Glucose-Capillary: 152 mg/dL — ABNORMAL HIGH (ref 70–99)
Glucose-Capillary: 112 mg/dL — ABNORMAL HIGH (ref 70–99)
Glucose-Capillary: 81 mg/dL (ref 70–99)

## 2022-08-30 NOTE — Progress Notes (Signed)
PROGRESS NOTE        PATIENT DETAILS Name: Douglas Hurst. Age: 81 y.o. Sex: male Date of Birth: September 13, 1941 Admit Date: 07/30/2022 Admitting Physician Clance Boll, MD [id number], [name], MD  Brief Summary: Patient is a 81 y.o.  male with history of PAF on Eliquis, HTN, HLD, COPD, chronic HFrEF (recovered EF)-with recent history of cholecystitis-s/p cholecystectomy but with retained CBD stone.  ERCP here was unsuccessful.  Patient was referred to [facility] underwent advanced endoscopy via transduodenal approach for ERCP on 8/3 at [facility] with Dr. Okey Dupre -he subsequently presented to the ED with severe abdominal pain-was found to have pancreatitis and admitted to the hospitalist service.  Course was complicated by A-fib RVR, pseudocyst formation, subhepatic fluid collection requiring CT-guided drain placement.    Significant events: 8/3>> admit to [facility]-abdominal pain after ERCP at [facility]-found to have acute pancreatitis 8/5>> A-fib RVR-no response to IV Lopressor-blood pressure soft-amiodarone infusion started.  After discussion with GI-heparin started without bolus.  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 8/5>> CT abdomen/pelvis: Resolution of hypoenhancement of the pancreas-persistent inflammatory changes within the retroperitoneum surrounding the head of the pancreas extending into the porta hepatis.  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 8/13>> CT abdomen/pelvis: Increased size of large perihepatic pseudocysts 8/23>> CT abdomen/pelvis: Interval decrease in loculated perihepatic fluid collection after percutaneous drain placement.  Significant microbiology data: 8/15>> perihepatic  fluid culture: No growth  Procedures: 8/15>> CT-guided drain placement right subcapsular fluid collection. 8/16>>Cortak placed (removed 8/30) 8/24>> s/p exchange and size up of perihepatic drain.  Consults: GI Phone consult-cards-Dr Crenshaw IR Palliative care  Subjective: Lying comfortably in bed.  No chest pain.  Hardly any abdominal pain.  Objective: Vitals: Blood pressure 95/64, pulse 61, temperature 97.6 F (36.4 C), temperature source Oral, resp. rate 17, height '6\' 1"'$  (1.854 m), weight 78.1 kg, SpO2 93 %.   Exam: Gen Exam:Alert awake-not in any distress HEENT:atraumatic, normocephalic Chest: B/L clear to auscultation anteriorly CVS:S1S2 regular Abdomen:soft no major tenderness observed today. Extremities:no edema Neurology: Non focal Skin: no rash   Pertinent Labs/Radiology:    Latest Ref Rng & Units 08/30/2022    3:54 AM 08/29/2022    5:38 AM 08/28/2022    7:46 AM  CBC  WBC 4.0 - 10.5 K/uL 6.8  10.9  15.0   Hemoglobin 13.0 - 17.0 g/dL 10.6  11.2  10.5   Hematocrit 39.0 - 52.0 % 31.5  33.2  32.1   Platelets 150 - 400 K/uL 276  254  277     Lab Results  Component Value Date   NA 135 08/28/2022   K 4.4 08/28/2022   CL 100 08/28/2022   CO2 27 08/28/2022      Assessment/Plan: Acute post ERCP pancreatitis with pseudocyst formation: Overall improved-NG tube removed 8/30-tolerating advancement in diet.    IR planning to repeat CT on 9/4 to assess subhepatic fluid collection.  GI following but signed off on 9/2.  Subhepatic space fluid collection: Likely due to leak from ERCP done via trans duodenal approach.  Drain in place-IR following.  Repeat CT planned on 9/4.  PAF with RVR: Due to acute illness-maintaining sinus rhythm-on amiodarone-and SQ Lovenox.  Once assure that he will not require any further procedures-will transition to [place].  Acute hypoxic respiratory failure: Due to pleural effusion/atelectasis-improved-on room air.  Continue mobilization.  Encourage  incentive spirometry.  AKI: Resolved-likely hemodynamically mediated.  Acute on chronic HFrEF with recovered EF: Volume status stable-no longer on furosemide.  Orthostatic hypotension: Noted on 9/1 with PT-gently hydrated.  Will need to repeat orthostatic vitals over the next few days.  Normocytic anemia: Due to critical/acute illness-no evidence of blood loss.  Follow CBC periodically.  DM-2 (A1c 5.6 on 7/7): CBGs on the lower side-change Levemir to 10 units twice daily.  Continue SSI.  Resume metformin on discharge.  Recent Labs    08/30/22 0805 08/30/22 1025 08/30/22 1201  GLUCAP 94 123* 102*     HTN: BP stable-continue metoprolol.  BPH: Continue Flomax  COPD: Continue bronchodilators  Infrarenal abdominal aortic aneurysm 4 mm: Unchanged from prior study-annual follow-up is required.  Debility/deconditioning: Due to acute illness-SNF planned on discharge.  Nutrition Status: Nutrition Problem: Severe Malnutrition Etiology: chronic illness (COPD, HF, acute pancreatitis) Signs/Symptoms: severe fat depletion, moderate fat depletion, severe muscle depletion, moderate muscle depletion Interventions: Ensure Enlive (each supplement provides 350kcal and 20 grams of protein), MVI    BMI: Estimated body mass index is 22.72 kg/m as calculated from the following:   Height as of this encounter: '6\' 1"'$  (1.854 m).   Weight as of this encounter: 78.1 kg.   Code status:   Code Status: Full Code   DVT Prophylaxis:IV heparin    Family Communication: Spouse at bedside on 9/2.  None at bedside today.   Disposition Plan: Status is: Inpatient Remains inpatient appropriate because: Severe pancreatitis-subhepatic collection-drain in place-repeat CT planned for 9/4-SNF plan-awaiting insurance authorization  Planned Discharge Destination:SNF likely early next week.   Diet: Diet Order             DIET DYS 3 Room service appropriate? Yes; Fluid consistency: Thin  Diet effective now                      Antimicrobial agents: Anti-infectives (From admission, onward)    Start     Dose/Rate Route Frequency Ordered Stop   08/02/22 1600  piperacillin-tazobactam (ZOSYN) IVPB 3.375 g  Status:  Discontinued        3.375 g 12.5 mL/hr over 240 Minutes Intravenous Every 8 hours 08/02/22 1039 08/13/22 1505   08/02/22 1130  piperacillin-tazobactam (ZOSYN) IVPB 4.5 g        4.5 g 200 mL/hr over 30 Minutes Intravenous  Once 08/02/22 1039 08/02/22 1240   07/31/22 0400  meropenem (MERREM) 1 g in sodium chloride 0.9 % 100 mL IVPB  Status:  Discontinued        1 g 200 mL/hr over 30 Minutes Intravenous Every 8 hours 07/31/22 0252 08/02/22 1039   07/30/22 2245  piperacillin-tazobactam (ZOSYN) IVPB 3.375 g  Status:  Discontinued        3.375 g 12.5 mL/hr over 240 Minutes Intravenous Every 8 hours 07/30/22 2239 07/31/22 0239        MEDICATIONS: Scheduled Meds:  acetaminophen  1,000 mg Oral TID   amiodarone  200 mg Oral Daily   enoxaparin (LOVENOX) injection  80 mg Subcutaneous Q12H   feeding supplement  237 mL Oral BID BM   fentaNYL  1 patch Transdermal Q72H   insulin aspart  0-9 Units Subcutaneous Q4H   insulin detemir  10 Units Subcutaneous BID   metoprolol tartrate  25 mg  Oral BID   multivitamin with minerals  1 tablet Oral Daily   polyethylene glycol  17 g Oral BID   senna-docusate  1 tablet Oral Daily   sodium chloride flush  5 mL Intracatheter Q8H   tamsulosin  0.8 mg Oral Daily   Continuous Infusions:   PRN Meds:.guaiFENesin-dextromethorphan, ondansetron **OR** ondansetron (ZOFRAN) IV, oxyCODONE, umeclidinium bromide   I have personally reviewed following labs and imaging studies  LABORATORY DATA: CBC: Recent Labs  Lab 08/26/22 0821 08/27/22 0433 08/28/22 0746 08/29/22 0538 08/30/22 0354  WBC 8.6 9.2 15.0* 10.9* 6.8  HGB 10.5* 11.0* 10.5* 11.2* 10.6*  HCT 32.5* 34.7* 32.1* 33.2* 31.5*  MCV 91.8 91.8 90.4 89.5 89.5  PLT 311 316 277 254 276      Basic Metabolic Panel: Recent Labs  Lab 08/25/22 0250 08/27/22 0433 08/28/22 0746  NA 137 137 135  K 4.5 4.4 4.4  CL 103 102 100  CO2 '27 29 27  '$ GLUCOSE 88 84 101*  BUN 25* 24* 23  CREATININE 1.07 1.33* 1.13  CALCIUM 9.9 9.9 9.7     GFR: Estimated Creatinine Clearance: 58.6 mL/min (by C-G formula based on SCr of 1.13 mg/dL).  Liver Function Tests: Recent Labs  Lab 08/27/22 0433  AST 19  ALT 29  ALKPHOS 72  BILITOT 0.5  PROT 6.3*  ALBUMIN 2.1*    No results for input(s): "LIPASE", "AMYLASE" in the last 168 hours.  No results for input(s): "AMMONIA" in the last 168 hours.  Coagulation Profile: No results for input(s): "INR", "PROTIME" in the last 168 hours.  Cardiac Enzymes: No results for input(s): "CKTOTAL", "CKMB", "CKMBINDEX", "TROPONINI" in the last 168 hours.  BNP (last 3 results) No results for input(s): "PROBNP" in the last 8760 hours.  Lipid Profile: No results for input(s): "CHOL", "HDL", "LDLCALC", "TRIG", "CHOLHDL", "LDLDIRECT" in the last 72 hours.  Thyroid Function Tests: No results for input(s): "TSH", "T4TOTAL", "FREET4", "T3FREE", "THYROIDAB" in the last 72 hours.  Anemia Panel: No results for input(s): "VITAMINB12", "FOLATE", "FERRITIN", "TIBC", "IRON", "RETICCTPCT" in the last 72 hours.  Urine analysis:    Component Value Date/Time   COLORURINE YELLOW 08/18/2022 1011   APPEARANCEUR CLOUDY (A) 08/18/2022 1011   LABSPEC 1.013 08/18/2022 1011   PHURINE 9.0 (H) 08/18/2022 1011   GLUCOSEU NEGATIVE 08/18/2022 [place] 08/18/2022 [place] 08/18/2022 1011   KETONESUR NEGATIVE 08/18/2022 1011   PROTEINUR NEGATIVE 08/18/2022 1011   UROBILINOGEN 4.0 (H) 10/02/2015 2135   NITRITE NEGATIVE 08/18/2022 1011   LEUKOCYTESUR NEGATIVE 08/18/2022 1011    Sepsis Labs: Lactic Acid, Venous    Component Value Date/Time   LATICACIDVEN 6.2 (HH) 07/31/2022 0528    MICROBIOLOGY: No results found for this or any  previous visit (from the past 240 hour(s)).  RADIOLOGY STUDIES/RESULTS: No results found.   LOS: 30 days   Oren Binet, MD  [facility]    To contact the attending provider between 7A-7P or the covering provider during after hours 7P-7A, please log into the web site [email or web address] and access using universal [place] password for that web site. If you do not have the password, please call the hospital operator.  08/30/2022, 2:05 PM

## 2022-08-31 ENCOUNTER — Inpatient Hospital Stay (HOSPITAL_COMMUNITY): Payer: Medicare HMO

## 2022-08-31 DIAGNOSIS — E43 Unspecified severe protein-calorie malnutrition: Secondary | ICD-10-CM | POA: Diagnosis not present

## 2022-08-31 DIAGNOSIS — N179 Acute kidney failure, unspecified: Secondary | ICD-10-CM | POA: Diagnosis not present

## 2022-08-31 DIAGNOSIS — I10 Essential (primary) hypertension: Secondary | ICD-10-CM | POA: Diagnosis not present

## 2022-08-31 DIAGNOSIS — K858 Other acute pancreatitis without necrosis or infection: Secondary | ICD-10-CM | POA: Diagnosis not present

## 2022-08-31 LAB — GLUCOSE, CAPILLARY
Glucose-Capillary: 103 mg/dL — ABNORMAL HIGH (ref 70–99)
Glucose-Capillary: 120 mg/dL — ABNORMAL HIGH (ref 70–99)
Glucose-Capillary: 123 mg/dL — ABNORMAL HIGH (ref 70–99)
Glucose-Capillary: 129 mg/dL — ABNORMAL HIGH (ref 70–99)
Glucose-Capillary: 140 mg/dL — ABNORMAL HIGH (ref 70–99)
Glucose-Capillary: 90 mg/dL (ref 70–99)
Glucose-Capillary: 91 mg/dL (ref 70–99)

## 2022-08-31 MED ORDER — IOHEXOL 300 MG/ML  SOLN
100.0000 mL | Freq: Once | INTRAMUSCULAR | Status: AC | PRN
  Administered 2022-08-31: 100 mL via INTRAVENOUS

## 2022-08-31 NOTE — TOC Progression Note (Signed)
Transition of Care (TOC) - Progression Note    Patient Details  Name: Douglas Hurst. MRN: [id number] Date of Birth: 07-18-1941  Transition of Care [facility]) CM/SW [place], [place] Phone Number: 08/31/2022, 3:30 PM  Clinical Narrative:     Pasrr still under in person review. CSW following to start auth. [facility] confirmed they can accept pending passr approval. CSW will continue to follow and assist with patients dc planning needs.  Expected Discharge Plan: Skilled Nursing Facility Barriers to Discharge: Ship broker, Continued Medical Work up, Environmental education officer)  Expected Discharge Plan and Services Expected Discharge Plan: [place] In-house Referral: Clinical Social Work   Post Acute Care Choice: [place] Living arrangements for the past 2 months: Single Family Home                                       Social Determinants of Health (SDOH) Interventions    Readmission Risk Interventions     No data to display

## 2022-08-31 NOTE — Progress Notes (Signed)
ANTICOAGULATION CONSULT NOTE - Follow Up Consult  Pharmacy Consult for Therapeutic Lovenox Indication: atrial fibrillation  No Known Allergies  Patient Measurements: Height: '6\' 1"'$  (185.4 cm) Weight: 78.1 kg (172 lb 2.9 oz) IBW/kg (Calculated) : 79.9 Heparin and Lovenox Dosing Weight: 80 kg  Vital Signs: Temp: 98 F (36.7 C) (09/04 0400) Temp Source: Oral (09/04 0400) BP: 141/61 (09/04 0400) Pulse Rate: 66 (09/04 0400)  Labs: Recent Labs    08/29/22 0538 08/30/22 0354  HGB 11.2* 10.6*  HCT 33.2* 31.5*  PLT 254 276     Estimated Creatinine Clearance: 58.6 mL/min (by C-G formula based on SCr of 1.13 mg/dL).  [name], no bolus per MD. Last dose of Eliquis was on 07/26/22 PTA. CBC remains low but stable, no s/s of bleeding reported, no changes in renal function.   Goal of Therapy:  Therapeutic anticoagulation for the prevention of VTE in the setting of afib   Plan:  Continue Lovenox 80 mg SQ q12h. Monitor CBC periodically, monitor for s/s of bleeding daily Transition back to PTA Eliquis when no further procedures planned.  Titus Dubin, PharmD PGY1 Pharmacy Resident 08/31/2022 8:39 AM

## 2022-08-31 NOTE — Progress Notes (Signed)
PROGRESS NOTE        PATIENT DETAILS Name: Douglas Hurst. Age: 81 y.o. Sex: male Date of Birth: [birth year] Admit Date: 07/30/2022 Admitting Physician Clance Boll, MD WIO:[id number], [name], MD  Brief Summary: Patient is a 81 y.o.  male with history of PAF on Eliquis, HTN, HLD, COPD, chronic HFrEF (recovered EF)-with recent history of cholecystitis-s/p cholecystectomy but with retained CBD stone.  ERCP here was unsuccessful.  Patient was referred to [facility] underwent advanced endoscopy via transduodenal approach for ERCP on 8/3 at [facility] with Dr. Okey Dupre -he subsequently presented to the ED with severe abdominal pain-was found to have pancreatitis and admitted to the hospitalist service.  Course was complicated by A-fib RVR, pseudocyst formation, subhepatic fluid collection requiring CT-guided drain placement.    Significant events: 8/3>> admit to [facility]-abdominal pain after ERCP at [facility]-found to have acute pancreatitis 8/5>> A-fib RVR-no response to IV Lopressor-blood pressure soft-amiodarone infusion started.  After discussion with GI-heparin started without bolus.  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 8/5>> CT abdomen/pelvis: Resolution of hypoenhancement of the pancreas-persistent inflammatory changes within the retroperitoneum surrounding the head of the pancreas extending into the porta hepatis.  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 8/13>> CT abdomen/pelvis: Increased size of large perihepatic pseudocysts 8/23>> CT abdomen/pelvis: Interval decrease in loculated perihepatic fluid collection after percutaneous drain placement. 9/04>> CT abdomen/pelvis: Mild decrease in  subcapsular collection, findings suggest resolving pancreatitis.  Significant microbiology data: 8/15>> perihepatic fluid culture: No growth  Procedures: 8/15>> CT-guided drain placement right subcapsular fluid collection. 8/16>>Cortak placed (removed 8/30) 8/24>> s/p exchange and size up of perihepatic drain.  Consults: GI Phone consult-cards-Dr Crenshaw IR Palliative care  Subjective: No major issues overnight-lying comfortably in bed.  Objective: Vitals: Blood pressure 132/73, pulse 68, temperature (!) 97.4 F (36.3 C), temperature source Oral, resp. rate (!) 24, height '6\' 1"'$  (1.854 m), weight 78.1 kg, SpO2 92 %.   Exam: Gen Exam:Alert awake-not in any distress HEENT:atraumatic, normocephalic Chest: B/L clear to auscultation anteriorly CVS:S1S2 regular Abdomen:soft non tender, non distended Extremities:no edema Neurology: Non focal Skin: no rash   Pertinent Labs/Radiology:    Latest Ref Rng & Units 08/30/2022    3:54 AM 08/29/2022    5:38 AM 08/28/2022    7:46 AM  CBC  WBC 4.0 - 10.5 K/uL 6.8  10.9  15.0   Hemoglobin 13.0 - 17.0 g/dL 10.6  11.2  10.5   Hematocrit 39.0 - 52.0 % 31.5  33.2  32.1   Platelets 150 - 400 K/uL 276  254  277     Lab Results  Component Value Date   NA 135 08/28/2022   K 4.4 08/28/2022   CL 100 08/28/2022   CO2 27 08/28/2022      Assessment/Plan: Acute post ERCP pancreatitis with pseudocyst formation: Overall improved-NG tube removed 8/30-tolerating advancement in diet.    Subhepatic space fluid collection: Likely due to leak from ERCP done via trans duodenal approach.  Drain in place-IR following.  Repeat CT on 9/4 continues to show subhepatic collection-drain in place-await further recommendations from IR.  PAF with RVR: Due to acute illness-maintaining sinus rhythm-on amiodarone-and SQ Lovenox.   Once assure that  he will not require any further procedures-will transition to [place].  Acute hypoxic respiratory failure: Due to pleural  effusion/atelectasis-improved-on room air.  Continue mobilization.  Encourage incentive spirometry.  AKI: Resolved-likely hemodynamically mediated.  Acute on chronic HFrEF with recovered EF: Volume status stable-no longer on furosemide.  Orthostatic hypotension: Noted on 9/1 with PT-gently hydrated.  Will need to repeat orthostatic vitals over the next few days.  Normocytic anemia: Due to critical/acute illness-no evidence of blood loss.  Follow CBC periodically.  DM-2 (A1c 5.6 on 7/7): CBGs on the lower side-change Levemir to 10 units twice daily.  Continue SSI.  Resume metformin on discharge.  Recent Labs    08/31/22 0331 08/31/22 0742 08/31/22 1118  GLUCAP 103* 90 120*     HTN: BP stable-continue metoprolol.  BPH: Continue Flomax  COPD: Continue bronchodilators  Infrarenal abdominal aortic aneurysm 4 mm: Unchanged from prior study-annual follow-up is required.  Debility/deconditioning: Due to acute illness-SNF planned on discharge.  Nutrition Status: Nutrition Problem: Severe Malnutrition Etiology: chronic illness (COPD, HF, acute pancreatitis) Signs/Symptoms: severe fat depletion, moderate fat depletion, severe muscle depletion, moderate muscle depletion Interventions: Ensure Enlive (each supplement provides 350kcal and 20 grams of protein), MVI    BMI: Estimated body mass index is 22.72 kg/m as calculated from the following:   Height as of this encounter: '6\' 1"'$  (1.854 m).   Weight as of this encounter: 78.1 kg.   Code status:   Code Status: Full Code   DVT Prophylaxis:IV heparin    Family Communication: Spouse at bedside on 9/4   Disposition Plan: Status is: Inpatient Remains inpatient appropriate because: Severe pancreatitis-subhepatic collection-drain in place-repeat CT planned for 9/4-SNF plan-awaiting insurance authorization  Planned Discharge Destination:SNF when bed is available   Diet: Diet Order             DIET DYS 3 Room service  appropriate? Yes; Fluid consistency: Thin  Diet effective now                     Antimicrobial agents: Anti-infectives (From admission, onward)    Start     Dose/Rate Route Frequency Ordered Stop   08/02/22 1600  piperacillin-tazobactam (ZOSYN) IVPB 3.375 g  Status:  Discontinued        3.375 g 12.5 mL/hr over 240 Minutes Intravenous Every 8 hours 08/02/22 1039 08/13/22 1505   08/02/22 1130  piperacillin-tazobactam (ZOSYN) IVPB 4.5 g        4.5 g 200 mL/hr over 30 Minutes Intravenous  Once 08/02/22 1039 08/02/22 1240   07/31/22 0400  meropenem (MERREM) 1 g in sodium chloride 0.9 % 100 mL IVPB  Status:  Discontinued        1 g 200 mL/hr over 30 Minutes Intravenous Every 8 hours 07/31/22 0252 08/02/22 1039   07/30/22 2245  piperacillin-tazobactam (ZOSYN) IVPB 3.375 g  Status:  Discontinued        3.375 g 12.5 mL/hr over 240 Minutes Intravenous Every 8 hours 07/30/22 2239 07/31/22 0239        MEDICATIONS: Scheduled Meds:  acetaminophen  1,000 mg Oral TID   amiodarone  200 mg Oral Daily   enoxaparin (LOVENOX) injection  80 mg Subcutaneous Q12H   feeding supplement  237 mL Oral BID BM   fentaNYL  1 patch Transdermal Q72H   insulin aspart  0-9 Units Subcutaneous Q4H   insulin detemir  10 Units Subcutaneous BID   metoprolol tartrate  25 mg Oral BID   multivitamin with minerals  1 tablet Oral Daily   polyethylene glycol  17 g Oral BID   senna-docusate  1 tablet Oral Daily   sodium chloride flush  5 mL Intracatheter Q8H   tamsulosin  0.8 mg Oral Daily   Continuous Infusions:   PRN Meds:.guaiFENesin-dextromethorphan, ondansetron **OR** ondansetron (ZOFRAN) IV, oxyCODONE, umeclidinium bromide   I have personally reviewed following labs and imaging studies  LABORATORY DATA: CBC: Recent Labs  Lab 08/26/22 0821 08/27/22 0433 08/28/22 0746 08/29/22 0538 08/30/22 0354  WBC 8.6 9.2 15.0* 10.9* 6.8  HGB 10.5* 11.0* 10.5* 11.2* 10.6*  HCT 32.5* 34.7* 32.1* 33.2* 31.5*   MCV 91.8 91.8 90.4 89.5 89.5  PLT 311 316 277 254 276     Basic Metabolic Panel: Recent Labs  Lab 08/25/22 0250 08/27/22 0433 08/28/22 0746  NA 137 137 135  K 4.5 4.4 4.4  CL 103 102 100  CO2 '27 29 27  '$ GLUCOSE 88 84 101*  BUN 25* 24* 23  CREATININE 1.07 1.33* 1.13  CALCIUM 9.9 9.9 9.7     GFR: Estimated Creatinine Clearance: 58.6 mL/min (by C-G formula based on SCr of 1.13 mg/dL).  Liver Function Tests: Recent Labs  Lab 08/27/22 0433  AST 19  ALT 29  ALKPHOS 72  BILITOT 0.5  PROT 6.3*  ALBUMIN 2.1*    No results for input(s): "LIPASE", "AMYLASE" in the last 168 hours.  No results for input(s): "AMMONIA" in the last 168 hours.  Coagulation Profile: No results for input(s): "INR", "PROTIME" in the last 168 hours.  Cardiac Enzymes: No results for input(s): "CKTOTAL", "CKMB", "CKMBINDEX", "TROPONINI" in the last 168 hours.  BNP (last 3 results) No results for input(s): "PROBNP" in the last 8760 hours.  Lipid Profile: No results for input(s): "CHOL", "HDL", "LDLCALC", "TRIG", "CHOLHDL", "LDLDIRECT" in the last 72 hours.  Thyroid Function Tests: No results for input(s): "TSH", "T4TOTAL", "FREET4", "T3FREE", "THYROIDAB" in the last 72 hours.  Anemia Panel: No results for input(s): "VITAMINB12", "FOLATE", "FERRITIN", "TIBC", "IRON", "RETICCTPCT" in the last 72 hours.  Urine analysis:    Component Value Date/Time   COLORURINE YELLOW 08/18/2022 1011   APPEARANCEUR CLOUDY (A) 08/18/2022 1011   LABSPEC 1.013 08/18/2022 1011   PHURINE 9.0 (H) 08/18/2022 1011   GLUCOSEU NEGATIVE 08/18/2022 [place] 08/18/2022 [place] 08/18/2022 1011   KETONESUR NEGATIVE 08/18/2022 1011   PROTEINUR NEGATIVE 08/18/2022 1011   UROBILINOGEN 4.0 (H) 10/02/2015 2135   NITRITE NEGATIVE 08/18/2022 1011   LEUKOCYTESUR NEGATIVE 08/18/2022 1011    Sepsis Labs: Lactic Acid, Venous    Component Value Date/Time   LATICACIDVEN 6.2 (HH) 07/31/2022  0528    MICROBIOLOGY: No results found for this or any previous visit (from the past 240 hour(s)).  RADIOLOGY STUDIES/RESULTS: CT ABDOMEN PELVIS W CONTRAST  Result Date: 08/31/2022 CLINICAL DATA:  Intra-abdominal/hepatic abscess. Drain in place. Follow-up study. EXAM: CT ABDOMEN AND PELVIS WITH CONTRAST TECHNIQUE: Multidetector CT imaging of the abdomen and pelvis was performed using the standard protocol following bolus administration of intravenous contrast. RADIATION DOSE REDUCTION: This exam was performed according to the departmental dose-optimization program which includes automated exposure control, adjustment of the mA and/or kV according to patient size and/or use of iterative reconstruction technique. CONTRAST:  145m OMNIPAQUE IOHEXOL 300 MG/ML  SOLN COMPARISON:  08/19/2022 and 08/09/2022. FINDINGS: Lower chest: Small right and trace left pleural effusions. Dependent lung base opacities, most evident in the right lower lobe, consistent with atelectasis. Right pleural effusion slightly increased in  size from the prior CT. No other change. Hepatobiliary: Subcapsular fluid collection overlies the right side of the liver, currently measuring 15 cm from anterior to posterior by 11 cm from superior to inferior, by 2 cm in thickness. Collection measures 3 cm in thickness on the prior study, and a 17 x 15 cm AP x SI. Pigtail drainage catheter lies within the collection, unchanged from the prior study. Trace amount of fluid lies adjacent to the collection along the anterolateral margin of the liver. No liver mass.  Status post cholecystectomy.  No bile duct dilation. Pancreas: Irregular fluid collection projects along the anterior superior aspect of the pancreatic head, unchanged from the prior CT. No other pancreatic mass or lesion. No duct dilation. Mild adjacent inflammatory improved from the prior CT. Spleen: Normal in size without focal abnormality. Adrenals/Urinary Tract: Mild left adrenal gland  thickening. No discrete adrenal nodule. 1.6 cm right renal cyst, stable with no follow-up recommended. No renal stones. No hydronephrosis. Normal ureters. Mild bladder wall thickening. No bladder mass. Stomach/Bowel: Normal stomach. Small bowel and colon are normal in caliber. No wall thickening. No bowel inflammation. Scattered left colon diverticula. Vascular/Lymphatic: Infrarenal abdominal aortic aneurysm, 3.9 cm, unchanged. 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 No enlarged lymph nodes. Reproductive: Stable enlarged prostate. Other: No ascites. Musculoskeletal: No fracture or acute finding. Stable benign-appearing lucent lesion expands the left iliac wing, thin sclerotic margin. No fracture. No aggressive bone lesion. IMPRESSION: 1. Mild interval decrease in the size of the subcapsular collection along the right margin of the liver. Stable position of the percutaneously placed pigtail catheter. 2. Irregular fluid collection or cyst along the pancreatic head is also unchanged, associated with mild adjacent inflammatory changes, which have improved from the prior CT. Findings suggest resolving pancreatitis. 3. Small right and trace left pleural effusions with associated dependent atelectasis most evident at the right lung base, similar to the prior CT. 4. Stable 3.9 cm infrarenal abdominal aortic aneurysm. Recommend follow-up ultrasound every 2 years. This recommendation follows ACR consensus guidelines: White Paper of the ACR Incidental Findings Committee II on Vascular Findings. J Am Coll Radiol 2013; 10:789-794. Electronically Signed   By: Lajean Manes M.D.   On: 08/31/2022 10:00     LOS: 31 days   Oren Binet, MD  [facility]    To contact the attending provider between 7A-7P or the covering provider during after hours 7P-7A, please log into the web site [email or web address] and access using universal [place] password for that web site. If you do not have the password, please call the  hospital operator.  08/31/2022, 1:27 PM

## 2022-09-01 DIAGNOSIS — I10 Essential (primary) hypertension: Secondary | ICD-10-CM | POA: Diagnosis not present

## 2022-09-01 DIAGNOSIS — K858 Other acute pancreatitis without necrosis or infection: Secondary | ICD-10-CM | POA: Diagnosis not present

## 2022-09-01 DIAGNOSIS — E43 Unspecified severe protein-calorie malnutrition: Secondary | ICD-10-CM | POA: Diagnosis not present

## 2022-09-01 DIAGNOSIS — N179 Acute kidney failure, unspecified: Secondary | ICD-10-CM | POA: Diagnosis not present

## 2022-09-01 LAB — GLUCOSE, CAPILLARY
Glucose-Capillary: 112 mg/dL — ABNORMAL HIGH (ref 70–99)
Glucose-Capillary: 138 mg/dL — ABNORMAL HIGH (ref 70–99)
Glucose-Capillary: 160 mg/dL — ABNORMAL HIGH (ref 70–99)
Glucose-Capillary: 169 mg/dL — ABNORMAL HIGH (ref 70–99)
Glucose-Capillary: 185 mg/dL — ABNORMAL HIGH (ref 70–99)
Glucose-Capillary: 78 mg/dL (ref 70–99)
Glucose-Capillary: 85 mg/dL (ref 70–99)
Glucose-Capillary: 96 mg/dL (ref 70–99)

## 2022-09-01 MED ORDER — INSULIN DETEMIR 100 UNIT/ML ~~LOC~~ SOLN
8.0000 [IU] | Freq: Two times a day (BID) | SUBCUTANEOUS | Status: DC
  Administered 2022-09-01 – 2022-09-03 (×4): 8 [IU] via SUBCUTANEOUS
  Filled 2022-09-01 (×7): qty 0.08

## 2022-09-01 NOTE — TOC Progression Note (Addendum)
Transition of Care (TOC) - Progression Note    Patient Details  Name: Douglas Hurst. MRN: [id number] Date of Birth: [birth year]  Transition of Care [facility]) CM/SW [place], LCSW Phone Number: 09/01/2022, 9:15 AM  Clinical Narrative:    [place] still awaiting in-person review for SNF placement. Updated patient's spouse.   Expected Discharge Plan: Skilled Nursing Facility Barriers to Discharge: Ship broker, Continued Medical Work up, Environmental education officer)  Expected Discharge Plan and Services Expected Discharge Plan: [place] In-house Referral: Clinical Social Work   Post Acute Care Choice: [place] Living arrangements for the past 2 months: Single Family Home                                       Social Determinants of Health (SDOH) Interventions    Readmission Risk Interventions     No data to display

## 2022-09-01 NOTE — Progress Notes (Signed)
Referring Physician(s): Dr Nena Alexander  Supervising Physician: Aletta Edouard  Patient Status:  [facility] - In-pt  Chief Complaint:  Hepatic abscess drain   Subjective:  Drain placed 8/15 in IR Upsized 8/24 OP decreased Orders for drain flushes 3x/day  CT yesterday: IMPRESSION: 1. Mild interval decrease in the size of the subcapsular collection along the right margin of the liver. Stable position of the percutaneously placed pigtail catheter. 2. Irregular fluid collection or cyst along the pancreatic head is also unchanged, associated with mild adjacent inflammatory changes, which have improved from the prior CT. Findings suggest resolving pancreatitis. 3. Small right and trace left pleural effusions with associated dependent atelectasis most evident at the right lung base, similar to the prior CT.  Pt feels some better Pt; wife and brother all frustrated with this drain and management To go to [facility] today  Allergies: Patient has no known allergies.  Medications: Prior to Admission medications   Medication Sig Start Date End Date Taking? Authorizing Provider  acetaminophen (TYLENOL) 325 MG tablet Take 2 tablets (650 mg total) by mouth every 6 (six) hours as needed for mild pain (or Fever >/= 101). 04/30/22  Yes Sheikh, Omair Latif, DO  amiodarone (PACERONE) 200 MG tablet Take 1 tablet (200 mg total) by mouth daily. 05/29/22  Yes Loel Dubonnet, NP  atorvastatin (LIPITOR) 40 MG tablet Take 40 mg by mouth every evening.   Yes [provider]  lisinopril-hydrochlorothiazide (ZESTORETIC) 10-12.5 MG tablet Take 0.5 tablets by mouth daily.   Yes [provider]  metFORMIN (GLUCOPHAGE-XR) 500 MG 24 hr tablet Take 500-1,000 mg by mouth See admin instructions. Take 2 tablets (1000 mg) by mouth in the morning and 1 tablet (500 mg) every evening 03/17/22  Yes [provider]  metoprolol tartrate (LOPRESSOR) 25 MG tablet Take 1 tablet (25 mg  total) by mouth 2 (two) times daily. 05/29/22  Yes Loel Dubonnet, NP  Multiple Vitamin (MULTIVITAMIN WITH MINERALS) TABS tablet Take 1 tablet by mouth daily.   Yes [provider]  ondansetron (ZOFRAN) 4 MG tablet Take 1 tablet (4 mg total) by mouth every 6 (six) hours as needed for nausea. 04/30/22  Yes Sheikh, Omair Latif, DO  pantoprazole (PROTONIX) 40 MG tablet Take 40 mg by mouth every morning.   Yes [provider]  polyethylene glycol powder (GLYCOLAX/MIRALAX) 17 GM/SCOOP powder Take 17 g by mouth daily. Patient taking differently: Take 17 g by mouth daily as needed for mild constipation or moderate constipation. 04/30/22  Yes Sheikh, Omair Latif, DO  senna-docusate (SENOKOT-S) 8.6-50 MG tablet Take 1 tablet by mouth at bedtime. Patient taking differently: Take 1 tablet by mouth at bedtime as needed for mild constipation or moderate constipation. 04/30/22  Yes Sheikh, Omair Latif, DO  SPIRIVA RESPIMAT 2.5 MCG/ACT AERS Inhale 2 puffs into the lungs daily as needed (COPD). 06/04/22  Yes [provider]  tadalafil (CIALIS) 20 MG tablet Take 20 mg by mouth every three (3) days as needed for erectile dysfunction. 03/29/22  Yes [provider]  tamsulosin (FLOMAX) 0.4 MG CAPS capsule Take 0.4 mg by mouth daily after supper. 05/13/22  Yes [provider]  apixaban (ELIQUIS) 5 MG TABS tablet Take 1 tablet (5 mg total) by mouth 2 (two) times daily. Patient not taking: Reported on 07/31/2022 05/29/22   Loel Dubonnet, NP  oxyCODONE-acetaminophen (PERCOCET) 5-325 MG tablet Take 1 tablet by mouth every 4 (four) hours as needed for severe pain. Patient not  taking: Reported on 07/31/2022 07/14/22 07/14/23  Norm Parcel, PA-C     Vital Signs: BP (!) 121/58 (BP Location: Left Arm)   Pulse 70   Temp (!) 97.5 F (36.4 C) (Oral)   Resp 20   Ht '6\' 1"'$  (1.854 m)   Wt 172 lb 2.9 oz (78.1 kg)   SpO2 95%   BMI 22.72 kg/m   Physical Exam Skin:    Comments: Site is  clean and dry NT No bleeding Flushes easily-- aspiration is difficult     Imaging: CT ABDOMEN PELVIS W CONTRAST  Result Date: 08/31/2022 CLINICAL DATA:  Intra-abdominal/hepatic abscess. Drain in place. Follow-up study. EXAM: CT ABDOMEN AND PELVIS WITH CONTRAST TECHNIQUE: Multidetector CT imaging of the abdomen and pelvis was performed using the standard protocol following bolus administration of intravenous contrast. RADIATION DOSE REDUCTION: This exam was performed according to the departmental dose-optimization program which includes automated exposure control, adjustment of the mA and/or kV according to patient size and/or use of iterative reconstruction technique. CONTRAST:  174m OMNIPAQUE IOHEXOL 300 MG/ML  SOLN COMPARISON:  08/19/2022 and 08/09/2022. FINDINGS: Lower chest: Small right and trace left pleural effusions. Dependent lung base opacities, most evident in the right lower lobe, consistent with atelectasis. Right pleural effusion slightly increased in size from the prior CT. No other change. Hepatobiliary: Subcapsular fluid collection overlies the right side of the liver, currently measuring 15 cm from anterior to posterior by 11 cm from superior to inferior, by 2 cm in thickness. Collection measures 3 cm in thickness on the prior study, and a 17 x 15 cm AP x SI. Pigtail drainage catheter lies within the collection, unchanged from the prior study. Trace amount of fluid lies adjacent to the collection along the anterolateral margin of the liver. No liver mass.  Status post cholecystectomy.  No bile duct dilation. Pancreas: Irregular fluid collection projects along the anterior superior aspect of the pancreatic head, unchanged from the prior CT. No other pancreatic mass or lesion. No duct dilation. Mild adjacent inflammatory improved from the prior CT. Spleen: Normal in size without focal abnormality. Adrenals/Urinary Tract: Mild left adrenal gland thickening. No discrete adrenal nodule. 1.6 cm  right renal cyst, stable with no follow-up recommended. No renal stones. No hydronephrosis. Normal ureters. Mild bladder wall thickening. No bladder mass. Stomach/Bowel: Normal stomach. Small bowel and colon are normal in caliber. No wall thickening. No bowel inflammation. Scattered left colon diverticula. Vascular/Lymphatic: Infrarenal abdominal aortic aneurysm, 3.9 cm, unchanged. 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 No enlarged lymph nodes. Reproductive: Stable enlarged prostate. Other: No ascites. Musculoskeletal: No fracture or acute finding. Stable benign-appearing lucent lesion expands the left iliac wing, thin sclerotic margin. No fracture. No aggressive bone lesion. IMPRESSION: 1. Mild interval decrease in the size of the subcapsular collection along the right margin of the liver. Stable position of the percutaneously placed pigtail catheter. 2. Irregular fluid collection or cyst along the pancreatic head is also unchanged, associated with mild adjacent inflammatory changes, which have improved from the prior CT. Findings suggest resolving pancreatitis. 3. Small right and trace left pleural effusions with associated dependent atelectasis most evident at the right lung base, similar to the prior CT. 4. Stable 3.9 cm infrarenal abdominal aortic aneurysm. Recommend follow-up ultrasound every 2 years. This recommendation follows ACR consensus guidelines: White Paper of the ACR Incidental Findings Committee II on Vascular Findings. J Am Coll Radiol 2013; 10:789-794. Electronically Signed   By: DLajean ManesM.D.   On: 08/31/2022 10:00  Labs:  CBC: Recent Labs    08/27/22 0433 08/28/22 0746 08/29/22 0538 08/30/22 0354  WBC 9.2 15.0* 10.9* 6.8  HGB 11.0* 10.5* 11.2* 10.6*  HCT 34.7* 32.1* 33.2* 31.5*  PLT 316 277 254 276    COAGS: Recent Labs    04/26/22 0843 08/01/22 1832 08/02/22 0703 08/11/22 0941  INR 1.3*  --   --  1.1  APTT  --  121* 73*  --     BMP: Recent Labs     08/23/22 0317 08/25/22 0250 08/27/22 0433 08/28/22 0746  NA 135 137 137 135  K 4.6 4.5 4.4 4.4  CL 101 103 102 100  CO2 '26 27 29 27  '$ GLUCOSE 164* 88 84 101*  BUN 32* 25* 24* 23  CALCIUM 9.5 9.9 9.9 9.7  CREATININE 1.00 1.07 1.33* 1.13  GFRNONAA >60 >60 54* >60    LIVER FUNCTION TESTS: Recent Labs    08/02/22 0703 08/03/22 0819 08/10/22 0747 08/27/22 0433  BILITOT 1.0 1.0 0.7 0.5  AST '19 18 17 19  '$ ALT '20 17 16 29  '$ ALKPHOS 72 64 73 72  PROT 5.5* 5.0* 5.4* 6.3*  ALBUMIN 2.3* 2.0* 1.8* 2.1*    Drain Location: RUQ Size: Fr size: 12 Fr Date of placement: 08/11/22; upsize 8/24  Currently to: Drain collection device: suction bulb 24 hour output:  Output by Drain (mL) 08/30/22 0701 - 08/30/22 1900 08/30/22 1901 - 08/31/22 0700 08/31/22 0701 - 08/31/22 1900 08/31/22 1901 - 09/01/22 0700 09/01/22 0701 - 09/01/22 1231  Closed System Drain 1 Lateral;Right Abdomen 12 Fr.   225 90     Interval imaging/drain manipulation:  CT yesterday   Current examination: Flushes/aspirates easily.  Insertion site unremarkable. Suture and stat lock in place. Dressed appropriately.   Plan: Continue TID flushes with 5 cc NS. Record output Q shift. Dressing changes QD or PRN if soiled.  Call IR APP or on call IR MD if difficulty flushing or sudden change in drain output.  Repeat imaging/possible drain injection once output < 10 mL/QD (excluding flush material). Consideration for drain removal if output is < 10 mL/QD (excluding flush material), pending discussion with the providing surgical service.  Discharge planning: Please contact IR APP or on call IR MD prior to patient d/c to ensure appropriate follow up plans are in place. Typically patient will follow up with IR clinic 10-14 days post d/c for repeat imaging/possible drain injection. IR scheduler will contact patient with date/time of appointment. Patient will need to flush drain QD with 5 cc NS, record output QD, dressing changes every  2-3 days or earlier if soiled.   IR will continue to follow - please call with questions or concerns.  To DC to [facility] today per family OP orders in place Will need to be flushing 3x/day at OP facility Pt/wife and Facility will hear from IR for appt date and time for CT and drain evaluation in 7-10 days  Electronically Signed: Lavonia Drafts, PA-C 09/01/2022, 12:31 PM   I spent a total of 15 Minutes at the the patient's bedside AND on the patient's hospital floor or unit, greater than 50% of which was counseling/coordinating care for hepatic collection drain

## 2022-09-01 NOTE — Progress Notes (Signed)
Occupational Therapy Treatment Patient Details Name: Douglas Hurst. MRN: [id number] DOB: [birth year] Today's Date: 09/01/2022   History of present illness Patient is a 81 yo male presenting to the ED with severe abdominal pain with radiation to right shoulder on 07/31/22. Patient had underwent ECRP for removal of stones from biliary and pancreatic duct on 8/3. Admitted with acute pancreatitis. Patient with cortrak and Perihepatic drain placement. PMH includes: bipolar d/o DMII, HTN, HLD, GERD, COPD ,  Afib ,diastolic heart failure, AAA with interim history of acute cholecystitis.   OT comments  Patient up in recliner upon entry and agreeable to OT session. Patient instructed in BUE strengthening exercises with red therapy band for 2 sets of 15 for shoulder abduction, shoulder extension, and elbow flexion/extension. AROM for shoulder flexion. Patient was min assist to stand from recliner and able to take steps with assistance for balance. Patient's discharge recommendations continues to be appropriate.    Recommendations for follow up therapy are one component of a multi-disciplinary discharge planning process, led by the attending physician.  Recommendations may be updated based on patient status, additional functional criteria and insurance authorization.    Follow Up Recommendations  Skilled nursing-short term rehab (<3 hours/day)    Assistance Recommended at Discharge Frequent or constant Supervision/Assistance  Patient can return home with the following  Assist for transportation;Help with stairs or ramp for entrance;Direct supervision/assist for medications management;A lot of help with walking and/or transfers;A lot of help with bathing/dressing/bathroom;Assistance with cooking/housework   Equipment Recommendations  Other (comment) (Defer to next venue)    Recommendations for Other Services      Precautions / Restrictions Precautions Precautions: Fall Precaution Comments: RUQ drain,  orthostatic hypotension Restrictions Weight Bearing Restrictions: No       Mobility Bed Mobility Overal bed mobility: Needs Assistance             General bed mobility comments: up in recliner upon entry    Transfers Overall transfer level: Needs assistance Equipment used: Rolling walker (2 wheels) Transfers: Sit to/from Stand Sit to Stand: Min assist           General transfer comment: stood from recliner with min assist     Balance Overall balance assessment: Needs assistance Sitting-balance support: Feet supported, Bilateral upper extremity supported Sitting balance-Leahy Scale: Fair Sitting balance - Comments: up in recliner   Standing balance support: Single extremity supported, Bilateral upper extremity supported Standing balance-Leahy Scale: Poor Standing balance comment: able to stand with patient reliant on RW for support                           ADL either performed or assessed with clinical judgement   ADL Overall ADL's : Needs assistance/impaired                                       General ADL Comments: focused on UE HEP and mobility    Extremity/Trunk Assessment Upper Extremity Assessment RUE Sensation: WNL RUE Coordination: WNL LUE Sensation: WNL LUE Coordination: WNL            Vision       Perception     Praxis      Cognition Arousal/Alertness: Awake/alert Behavior During Therapy: Flat affect Overall Cognitive Status: Impaired/Different from baseline Area of Impairment: Problem solving, Awareness, Safety/judgement, Attention, Following commands  Current Attention Level: Selective   Following Commands: Follows one step commands with increased time Safety/Judgement: Decreased awareness of safety, Decreased awareness of deficits Awareness: Emergent Problem Solving: Requires verbal cues, Decreased initiation, Difficulty sequencing General Comments: patient more willing to  participate with OT session        Exercises Exercises: General Upper Extremity General Exercises - Upper Extremity Shoulder Flexion: AROM, Both, 10 reps Shoulder Extension: Strengthening, 10 reps, Seated, Theraband Theraband Level (Shoulder Extension): Level 2 (Red) Shoulder ABduction: Strengthening, Both, 15 reps, Seated, Theraband Theraband Level (Shoulder Abduction): Level 2 (Red) Elbow Flexion: Strengthening, Both, 15 reps, Seated, Theraband Theraband Level (Elbow Flexion): Level 2 (Red) Elbow Extension: Strengthening, Both, 15 reps, Seated, Theraband Theraband Level (Elbow Extension): Level 2 (Red)    Shoulder Instructions       General Comments      Pertinent Vitals/ Pain       Pain Assessment Pain Assessment: Faces Faces Pain Scale: Hurts little more Pain Location: right side Pain Descriptors / Indicators: Grimacing, Sharp Pain Intervention(s): Monitored during session, Patient requesting pain meds-RN notified, Repositioned  Home Living                                          Prior Functioning/Environment              Frequency  Min 2X/week        Progress Toward Goals  OT Goals(current goals can now be found in the care plan section)  Progress towards OT goals: Progressing toward goals  Acute Rehab OT Goals Patient Stated Goal: go to rehab OT Goal Formulation: With patient Time For Goal Achievement: 09/01/22 Potential to Achieve Goals: Good ADL Goals Pt Will Perform Grooming: with min guard assist;standing Pt Will Perform Lower Body Bathing: with min assist;sit to/from stand Pt Will Perform Lower Body Dressing: with min assist;sit to/from stand Pt Will Transfer to Toilet: with min guard assist;ambulating;bedside commode Pt Will Perform Toileting - Clothing Manipulation and hygiene: with modified independence;sit to/from stand Pt/caregiver will Perform Home Exercise Program: Both right and left upper extremity;Increased  strength;With theraband;With written HEP provided Additional ADL Goal #1: Patient will demonstrate increased activity tolerance to complete functional task in standing for 3-5 minutes without need for seated rest break.  Plan Discharge plan remains appropriate;Frequency remains appropriate    Co-evaluation                 AM-PAC OT "6 Clicks" Daily Activity     Outcome Measure   Help from another person eating meals?: A Little (needs encouragement for PO) Help from another person taking care of personal grooming?: A Little Help from another person toileting, which includes using toliet, bedpan, or urinal?: A Lot Help from another person bathing (including washing, rinsing, drying)?: A Lot Help from another person to put on and taking off regular upper body clothing?: A Little Help from another person to put on and taking off regular lower body clothing?: A Lot 6 Click Score: 15    End of Session Equipment Utilized During Treatment: Gait belt;Rolling walker (2 wheels)  OT Visit Diagnosis: Unsteadiness on feet (R26.81);Muscle weakness (generalized) (M62.81);Other symptoms and signs involving cognitive function;Pain   Activity Tolerance Patient tolerated treatment well   Patient Left in chair;with call bell/phone within reach;with family/visitor present   Nurse Communication Mobility status        Time: 5956-3875 OT  Time Calculation (min): 26 min  Charges: OT General Charges $OT Visit: 1 Visit OT Treatments $Self Care/Home Management : 8-22 mins $Therapeutic Activity: 8-22 mins  Lodema Hong, OTA Acute Rehabilitation Services  Office [place] 09/01/2022, 1:55 PM

## 2022-09-01 NOTE — Progress Notes (Signed)
PROGRESS NOTE        PATIENT DETAILS Name: Douglas Hurst. Age: 81 y.o. Sex: male Date of Birth: [birth year] Admit Date: 07/30/2022 Admitting Physician Clance Boll, MD DVV:[id number], [name], MD  Brief Summary: Patient is a 81 y.o.  male with history of PAF on Eliquis, HTN, HLD, COPD, chronic HFrEF (recovered EF)-with recent history of cholecystitis-s/p cholecystectomy but with retained CBD stone.  ERCP here was unsuccessful.  Patient was referred to [facility] underwent advanced endoscopy via transduodenal approach for ERCP on 8/3 at [facility] with Dr. Okey Dupre -he subsequently presented to the ED with severe abdominal pain-was found to have pancreatitis and admitted to the hospitalist service.  Course was complicated by A-fib RVR, pseudocyst formation, subhepatic fluid collection requiring CT-guided drain placement.    Significant events: 8/3>> admit to [facility]-abdominal pain after ERCP at [facility]-found to have acute pancreatitis 8/5>> A-fib RVR-no response to IV Lopressor-blood pressure soft-amiodarone infusion started.  After discussion with GI-heparin started without bolus.  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 8/5>> CT abdomen/pelvis: Resolution of hypoenhancement of the pancreas-persistent inflammatory changes within the retroperitoneum surrounding the head of the pancreas extending into the porta hepatis.  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 8/13>> CT abdomen/pelvis: Increased size of large perihepatic pseudocysts 8/23>> CT abdomen/pelvis: Interval decrease in loculated perihepatic fluid collection after percutaneous drain placement. 9/04>> CT abdomen/pelvis: Mild decrease in  subcapsular collection, findings suggest resolving pancreatitis.  Significant microbiology data: 8/15>> perihepatic fluid culture: No growth  Procedures: 8/15>> CT-guided drain placement right subcapsular fluid collection. 8/16>>Cortak placed (removed 8/30) 8/24>> s/p exchange and size up of perihepatic drain.  Consults: GI Phone consult-cards-Dr Crenshaw IR Palliative care  Subjective: Lying comfortably in bed-no major issues overnight.  No pain in his abdomen.  Tolerating diet.  Objective: Vitals: Blood pressure (!) 121/58, pulse 70, temperature 97.9 F (36.6 C), temperature source Oral, resp. rate 20, height '6\' 1"'$  (1.854 m), weight 78.1 kg, SpO2 95 %.   Exam: Gen Exam:Alert awake-not in any distress HEENT:atraumatic, normocephalic Chest: B/L clear to auscultation anteriorly CVS:S1S2 regular Abdomen:soft non tender, non distended Extremities:no edema Neurology: Non focal Skin: no rash   Pertinent Labs/Radiology:    Latest Ref Rng & Units 08/30/2022    3:54 AM 08/29/2022    5:38 AM 08/28/2022    7:46 AM  CBC  WBC 4.0 - 10.5 K/uL 6.8  10.9  15.0   Hemoglobin 13.0 - 17.0 g/dL 10.6  11.2  10.5   Hematocrit 39.0 - 52.0 % 31.5  33.2  32.1   Platelets 150 - 400 K/uL 276  254  277     Lab Results  Component Value Date   NA 135 08/28/2022   K 4.4 08/28/2022   CL 100 08/28/2022   CO2 27 08/28/2022      Assessment/Plan: Acute post ERCP pancreatitis with pseudocyst formation: Overall improved-NG tube removed 8/30-tolerating advancement in diet.    Subhepatic space fluid collection: Likely due to leak from ERCP done via trans duodenal approach.  Drain in place-IR following.  Repeat CT on 9/4 continues to show subhepatic collection-drain remains in place.  IR continues to follow.  PAF with RVR: Due to acute illness-maintaining  sinus rhythm-on amiodarone-and SQ Lovenox.   Once assured that he will not require any further procedures-will transition to [place].  Acute hypoxic  respiratory failure: Due to pleural effusion/atelectasis-improved-on room air.  Continue mobilization.  Encourage incentive spirometry.  AKI: Resolved-likely hemodynamically mediated.  Acute on chronic HFrEF with recovered EF: Volume status stable-no longer on furosemide.  Orthostatic hypotension: Noted on 9/1 with PT-gently hydrated.  Will need to repeat orthostatic vitals over the next few days.  Normocytic anemia: Due to critical/acute illness-no evidence of blood loss.  Follow CBC periodically.  DM-2 (A1c 5.6 on 7/7): CBGs remains on the lower side-decrease Lantus to 8 units-plan is to resume metformin on discharge.   Recent Labs    09/01/22 0336 09/01/22 0807 09/01/22 1200  GLUCAP 78 85 138*     HTN: BP stable-continue metoprolol.  BPH: Continue Flomax  COPD: Continue bronchodilators  Infrarenal abdominal aortic aneurysm 4 mm: Unchanged from prior study-annual follow-up is required.  Debility/deconditioning: Due to acute illness-SNF planned on discharge.  Nutrition Status: Nutrition Problem: Severe Malnutrition Etiology: chronic illness (COPD, HF, acute pancreatitis) Signs/Symptoms: severe fat depletion, moderate fat depletion, severe muscle depletion, moderate muscle depletion Interventions: Ensure Enlive (each supplement provides 350kcal and 20 grams of protein), MVI    BMI: Estimated body mass index is 22.72 kg/m as calculated from the following:   Height as of this encounter: '6\' 1"'$  (1.854 m).   Weight as of this encounter: 78.1 kg.   Code status:   Code Status: Full Code   DVT Prophylaxis:IV heparin    Family Communication: Spouse at bedside on 9/4   Disposition Plan: Status is: Inpatient Remains inpatient appropriate because: Severe pancreatitis-subhepatic collection-drain in place-repeat CT planned for 9/4-SNF plan-awaiting insurance authorization  Planned Discharge Destination:SNF when bed is available   Diet: Diet Order             DIET DYS  3 Room service appropriate? Yes; Fluid consistency: Thin  Diet effective now                     Antimicrobial agents: Anti-infectives (From admission, onward)    Start     Dose/Rate Route Frequency Ordered Stop   08/02/22 1600  piperacillin-tazobactam (ZOSYN) IVPB 3.375 g  Status:  Discontinued        3.375 g 12.5 mL/hr over 240 Minutes Intravenous Every 8 hours 08/02/22 1039 08/13/22 1505   08/02/22 1130  piperacillin-tazobactam (ZOSYN) IVPB 4.5 g        4.5 g 200 mL/hr over 30 Minutes Intravenous  Once 08/02/22 1039 08/02/22 1240   07/31/22 0400  meropenem (MERREM) 1 g in sodium chloride 0.9 % 100 mL IVPB  Status:  Discontinued        1 g 200 mL/hr over 30 Minutes Intravenous Every 8 hours 07/31/22 0252 08/02/22 1039   07/30/22 2245  piperacillin-tazobactam (ZOSYN) IVPB 3.375 g  Status:  Discontinued        3.375 g 12.5 mL/hr over 240 Minutes Intravenous Every 8 hours 07/30/22 2239 07/31/22 0239        MEDICATIONS: Scheduled Meds:  acetaminophen  1,000 mg Oral TID   amiodarone  200 mg Oral Daily   enoxaparin (LOVENOX) injection  80 mg Subcutaneous Q12H   feeding supplement  237 mL Oral BID BM   fentaNYL  1 patch Transdermal Q72H   insulin aspart  0-9 Units Subcutaneous Q4H   insulin detemir  10 Units Subcutaneous BID   metoprolol tartrate  25 mg  Oral BID   multivitamin with minerals  1 tablet Oral Daily   polyethylene glycol  17 g Oral BID   senna-docusate  1 tablet Oral Daily   sodium chloride flush  5 mL Intracatheter Q8H   tamsulosin  0.8 mg Oral Daily   Continuous Infusions:   PRN Meds:.guaiFENesin-dextromethorphan, ondansetron **OR** ondansetron (ZOFRAN) IV, oxyCODONE, umeclidinium bromide   I have personally reviewed following labs and imaging studies  LABORATORY DATA: CBC: Recent Labs  Lab 08/26/22 0821 08/27/22 0433 08/28/22 0746 08/29/22 0538 08/30/22 0354  WBC 8.6 9.2 15.0* 10.9* 6.8  HGB 10.5* 11.0* 10.5* 11.2* 10.6*  HCT 32.5* 34.7*  32.1* 33.2* 31.5*  MCV 91.8 91.8 90.4 89.5 89.5  PLT 311 316 277 254 276     Basic Metabolic Panel: Recent Labs  Lab 08/27/22 0433 08/28/22 0746  NA 137 135  K 4.4 4.4  CL 102 100  CO2 29 27  GLUCOSE 84 101*  BUN 24* 23  CREATININE 1.33* 1.13  CALCIUM 9.9 9.7     GFR: Estimated Creatinine Clearance: 58.6 mL/min (by C-G formula based on SCr of 1.13 mg/dL).  Liver Function Tests: Recent Labs  Lab 08/27/22 0433  AST 19  ALT 29  ALKPHOS 72  BILITOT 0.5  PROT 6.3*  ALBUMIN 2.1*    No results for input(s): "LIPASE", "AMYLASE" in the last 168 hours.  No results for input(s): "AMMONIA" in the last 168 hours.  Coagulation Profile: No results for input(s): "INR", "PROTIME" in the last 168 hours.  Cardiac Enzymes: No results for input(s): "CKTOTAL", "CKMB", "CKMBINDEX", "TROPONINI" in the last 168 hours.  BNP (last 3 results) No results for input(s): "PROBNP" in the last 8760 hours.  Lipid Profile: No results for input(s): "CHOL", "HDL", "LDLCALC", "TRIG", "CHOLHDL", "LDLDIRECT" in the last 72 hours.  Thyroid Function Tests: No results for input(s): "TSH", "T4TOTAL", "FREET4", "T3FREE", "THYROIDAB" in the last 72 hours.  Anemia Panel: No results for input(s): "VITAMINB12", "FOLATE", "FERRITIN", "TIBC", "IRON", "RETICCTPCT" in the last 72 hours.  Urine analysis:    Component Value Date/Time   COLORURINE YELLOW 08/18/2022 1011   APPEARANCEUR CLOUDY (A) 08/18/2022 1011   LABSPEC 1.013 08/18/2022 1011   PHURINE 9.0 (H) 08/18/2022 1011   GLUCOSEU NEGATIVE 08/18/2022 [place] 08/18/2022 [place] 08/18/2022 1011   KETONESUR NEGATIVE 08/18/2022 1011   PROTEINUR NEGATIVE 08/18/2022 1011   UROBILINOGEN 4.0 (H) 10/02/2015 2135   NITRITE NEGATIVE 08/18/2022 1011   LEUKOCYTESUR NEGATIVE 08/18/2022 1011    Sepsis Labs: Lactic Acid, Venous    Component Value Date/Time   LATICACIDVEN 6.2 (HH) 07/31/2022 0528    MICROBIOLOGY: No  results found for this or any previous visit (from the past 240 hour(s)).  RADIOLOGY STUDIES/RESULTS: CT ABDOMEN PELVIS W CONTRAST  Result Date: 08/31/2022 CLINICAL DATA:  Intra-abdominal/hepatic abscess. Drain in place. Follow-up study. EXAM: CT ABDOMEN AND PELVIS WITH CONTRAST TECHNIQUE: Multidetector CT imaging of the abdomen and pelvis was performed using the standard protocol following bolus administration of intravenous contrast. RADIATION DOSE REDUCTION: This exam was performed according to the departmental dose-optimization program which includes automated exposure control, adjustment of the mA and/or kV according to patient size and/or use of iterative reconstruction technique. CONTRAST:  143m OMNIPAQUE IOHEXOL 300 MG/ML  SOLN COMPARISON:  08/19/2022 and 08/09/2022. FINDINGS: Lower chest: Small right and trace left pleural effusions. Dependent lung base opacities, most evident in the right lower lobe, consistent with atelectasis. Right pleural effusion slightly increased in size from  the prior CT. No other change. Hepatobiliary: Subcapsular fluid collection overlies the right side of the liver, currently measuring 15 cm from anterior to posterior by 11 cm from superior to inferior, by 2 cm in thickness. Collection measures 3 cm in thickness on the prior study, and a 17 x 15 cm AP x SI. Pigtail drainage catheter lies within the collection, unchanged from the prior study. Trace amount of fluid lies adjacent to the collection along the anterolateral margin of the liver. No liver mass.  Status post cholecystectomy.  No bile duct dilation. Pancreas: Irregular fluid collection projects along the anterior superior aspect of the pancreatic head, unchanged from the prior CT. No other pancreatic mass or lesion. No duct dilation. Mild adjacent inflammatory improved from the prior CT. Spleen: Normal in size without focal abnormality. Adrenals/Urinary Tract: Mild left adrenal gland thickening. No discrete adrenal  nodule. 1.6 cm right renal cyst, stable with no follow-up recommended. No renal stones. No hydronephrosis. Normal ureters. Mild bladder wall thickening. No bladder mass. Stomach/Bowel: Normal stomach. Small bowel and colon are normal in caliber. No wall thickening. No bowel inflammation. Scattered left colon diverticula. Vascular/Lymphatic: Infrarenal abdominal aortic aneurysm, 3.9 cm, unchanged. 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 No enlarged lymph nodes. Reproductive: Stable enlarged prostate. Other: No ascites. Musculoskeletal: No fracture or acute finding. Stable benign-appearing lucent lesion expands the left iliac wing, thin sclerotic margin. No fracture. No aggressive bone lesion. IMPRESSION: 1. Mild interval decrease in the size of the subcapsular collection along the right margin of the liver. Stable position of the percutaneously placed pigtail catheter. 2. Irregular fluid collection or cyst along the pancreatic head is also unchanged, associated with mild adjacent inflammatory changes, which have improved from the prior CT. Findings suggest resolving pancreatitis. 3. Small right and trace left pleural effusions with associated dependent atelectasis most evident at the right lung base, similar to the prior CT. 4. Stable 3.9 cm infrarenal abdominal aortic aneurysm. Recommend follow-up ultrasound every 2 years. This recommendation follows ACR consensus guidelines: White Paper of the ACR Incidental Findings Committee II on Vascular Findings. J Am Coll Radiol 2013; 10:789-794. Electronically Signed   By: Lajean Manes M.D.   On: 08/31/2022 10:00     LOS: 32 days   Oren Binet, MD  [facility]    To contact the attending provider between 7A-7P or the covering provider during after hours 7P-7A, please log into the web site [email or web address] and access using universal [place] password for that web site. If you do not have the password, please call the hospital operator.  09/01/2022, 1:45  PM

## 2022-09-01 NOTE — Progress Notes (Signed)
Physical Therapy Treatment Patient Details Name: Douglas Hurst. MRN: [id number] DOB: [birth year] Today's Date: 09/01/2022   History of Present Illness Patient is a 81 yo male presenting to the ED with severe abdominal pain with radiation to right shoulder on 07/31/22. Patient had underwent ECRP for removal of stones from biliary and pancreatic duct on 8/3. Admitted with acute pancreatitis. Patient with cortrak and Perihepatic drain placement. PMH includes: bipolar d/o DMII, HTN, HLD, GERD, COPD ,  Afib ,diastolic heart failure, AAA with interim history of acute cholecystitis.    PT Comments    Pt received in recliner, agreeable to therapy session and with good participation and tolerance for transfer and gait training and instruction on log rolling for bed mobility to reduce R flank pain. Pt with poor safety awareness with transfers, pt family present and given instruction on guarding positions with gait belt use and safer UE placement for transfers due to pt memory deficits. Pt with improved activity tolerance this date and able to re-initiate gait training short distances in the room with chair follow and up to +2 minA for safety. Pt continues to benefit from PT services to progress toward functional mobility goals.   Recommendations for follow up therapy are one component of a multi-disciplinary discharge planning process, led by the attending physician.  Recommendations may be updated based on patient status, additional functional criteria and insurance authorization.  Follow Up Recommendations  Skilled nursing-short term rehab (<3 hours/day) Can patient physically be transported by private vehicle: Yes   Assistance Recommended at Discharge Frequent or constant Supervision/Assistance  Patient can return home with the following A lot of help with walking and/or transfers;A lot of help with bathing/dressing/bathroom;Assistance with cooking/housework;Direct supervision/assist for medications  management;Direct supervision/assist for financial management;Assist for transportation;Help with stairs or ramp for entrance   Equipment Recommendations  None recommended by PT (TBD post-acute)    Recommendations for Other Services       Precautions / Restrictions Precautions Precautions: Fall Precaution Comments: RUQ drain, orthostatic hypotension Restrictions Weight Bearing Restrictions: No     Mobility  Bed Mobility Overal bed mobility: Needs Assistance Bed Mobility: Sit to Sidelying, Rolling Rolling: Min guard       Sit to sidelying: Min assist, HOB elevated General bed mobility comments: cues for log roll technique and minA for BLE lifting over edge of bed; pt using RUE on bed rail returning to lay on his L side    Transfers Overall transfer level: Needs assistance Equipment used: Rolling walker (2 wheels) Transfers: Sit to/from Stand Sit to Stand: Min assist           General transfer comment: stood from recliner with min assist, needed cues each attempt for sit<>stand hand placement due to poor carryover of initial cues. Reinforced safe technique with pt and family members in room. Pt did better using RUE on RW and pushing from chair with LUE    Ambulation/Gait Ambulation/Gait assistance: Min assist, +2 safety/equipment Gait Distance (Feet): 25 Feet (x2 with seated break between) Assistive device: Rolling walker (2 wheels) Gait Pattern/deviations: Trunk flexed, Narrow base of support, Shuffle, Decreased stride length, Step-to pattern (decreased heel strike initially) Gait velocity: decr     General Gait Details: improved heel strike after cues given; close chair follow for safety; HR 68-77 bpm, noisy pleth signal during mobility due to grip on RW handle but SpO2 WFL on RA once pt not grasping RW, pt not dyspneic      Balance Overall balance assessment: Needs  assistance Sitting-balance support: Feet supported, Bilateral upper extremity supported Sitting  balance-Leahy Scale: Fair Sitting balance - Comments: static sitting and weight shifting to don/doff gown no LOB   Standing balance support: Single extremity supported, Bilateral upper extremity supported Standing balance-Leahy Scale: Poor Standing balance comment: able to stand with patient reliant on RW for support, some external support also needed due to posterior LOB upon standing                            Cognition Arousal/Alertness: Awake/alert Behavior During Therapy: Flat affect Overall Cognitive Status: Impaired/Different from baseline Area of Impairment: Problem solving, Awareness, Safety/judgement, Attention, Following commands                   Current Attention Level: Selective   Following Commands: Follows one step commands with increased time Safety/Judgement: Decreased awareness of safety, Decreased awareness of deficits Awareness: Emergent Problem Solving: Requires verbal cues, Decreased initiation, Difficulty sequencing General Comments: cooperative with encouragement; decreased carryover of cues for safe hand placement, reinforced with pt spouse and brother who will assist him        Exercises Other Exercises Other Exercises: IS x 5 reps ([phone])    General Comments General comments (skin integrity, edema, etc.): BP 111/57 seated post-ambulation, pt denies dizziness just c/o fatigue and R flank pain; HR/SpO2 WFL on RA      Pertinent Vitals/Pain Pain Assessment Pain Assessment: Faces Faces Pain Scale: Hurts even more Pain Location: right flank/drain site Pain Descriptors / Indicators: Grimacing, Sharp Pain Intervention(s): Monitored during session, Repositioned, RN gave pain meds during session (pt defers ice pack)     PT Goals (current goals can now be found in the care plan section) Acute Rehab PT Goals Patient Stated Goal: to get stronger and pain reduced. PT Goal Formulation: With patient/family Time For Goal Achievement:  09/07/22 Progress towards PT goals: Progressing toward goals    Frequency    Min 2X/week      PT Plan Current plan remains appropriate       AM-PAC PT "6 Clicks" Mobility   Outcome Measure  Help needed turning from your back to your side while in a flat bed without using bedrails?: A Little Help needed moving from lying on your back to sitting on the side of a flat bed without using bedrails?: A Lot (mod sequencing cues for this and all "2" items below) Help needed moving to and from a bed to a chair (including a wheelchair)?: A Lot Help needed standing up from a chair using your arms (e.g., wheelchair or bedside chair)?: A Lot Help needed to walk in hospital room?: A Lot Help needed climbing 3-5 steps with a railing? : Total 6 Click Score: 12    End of Session Equipment Utilized During Treatment: Gait belt Activity Tolerance: Patient tolerated treatment well Patient left: in bed;with call bell/phone within reach;with bed alarm set;Other (comment);with family/visitor present (pt heels floated) Nurse Communication: Mobility status PT Visit Diagnosis: Muscle weakness (generalized) (M62.81);Difficulty in walking, not elsewhere classified (R26.2);Other abnormalities of gait and mobility (R26.89);Pain Pain - Right/Left: Right Pain - part of body:  (flank)     Time: 1332-1400 PT Time Calculation (min) (ACUTE ONLY): 28 min  Charges:  $Gait Training: 8-22 mins $Therapeutic Activity: 8-22 mins                     Adler Chartrand P., PTA Acute Rehabilitation Services Secure Chat  Preferred 9a-5:30pm Office: [place] 09/01/2022, 2:26 PM

## 2022-09-02 ENCOUNTER — Ambulatory Visit (HOSPITAL_BASED_OUTPATIENT_CLINIC_OR_DEPARTMENT_OTHER): Payer: Medicare HMO | Admitting: Cardiovascular Disease

## 2022-09-02 DIAGNOSIS — E43 Unspecified severe protein-calorie malnutrition: Secondary | ICD-10-CM | POA: Diagnosis not present

## 2022-09-02 DIAGNOSIS — K858 Other acute pancreatitis without necrosis or infection: Secondary | ICD-10-CM | POA: Diagnosis not present

## 2022-09-02 DIAGNOSIS — N179 Acute kidney failure, unspecified: Secondary | ICD-10-CM | POA: Diagnosis not present

## 2022-09-02 DIAGNOSIS — I10 Essential (primary) hypertension: Secondary | ICD-10-CM | POA: Diagnosis not present

## 2022-09-02 LAB — GLUCOSE, CAPILLARY
Glucose-Capillary: 145 mg/dL — ABNORMAL HIGH (ref 70–99)
Glucose-Capillary: 89 mg/dL (ref 70–99)
Glucose-Capillary: 106 mg/dL — ABNORMAL HIGH (ref 70–99)
Glucose-Capillary: 141 mg/dL — ABNORMAL HIGH (ref 70–99)
Glucose-Capillary: 144 mg/dL — ABNORMAL HIGH (ref 70–99)
Glucose-Capillary: 147 mg/dL — ABNORMAL HIGH (ref 70–99)
Glucose-Capillary: 114 mg/dL — ABNORMAL HIGH (ref 70–99)

## 2022-09-02 NOTE — Progress Notes (Signed)
Physical Therapy Treatment Patient Details Name: Douglas Hurst. MRN: [id number] DOB: 08/10/1941 Today's Date: 09/02/2022   History of Present Illness Patient is a 81 yo male presenting to the ED with severe abdominal pain with radiation to right shoulder on 07/31/22. Patient had underwent ECRP for removal of stones from biliary and pancreatic duct on 8/3. Admitted with acute pancreatitis. Patient with cortrak and Perihepatic drain placement. PMH includes: bipolar d/o DMII, HTN, HLD, GERD, COPD ,  Afib ,diastolic heart failure, AAA with interim history of acute cholecystitis.    PT Comments    Pt received in supine, agreeable to therapy session with encouragement, with emphasis on transfer training, pre-gait tasks, use of RW and benefits of OOB mobility, pt/spouse receptive. Pt BP stable however with increased fatigue (spouse reports concerns of possible sleep apnea) and unable to tolerate standing >3 minutes at a time during BP assessment and gait tasks at bedside. Pt impulsive to sit when fatigued so provided with close chair follow for safety. Pt continues to benefit from PT services to progress toward functional mobility goals.    Recommendations for follow up therapy are one component of a multi-disciplinary discharge planning process, led by the attending physician.  Recommendations may be updated based on patient status, additional functional criteria and insurance authorization.  Follow Up Recommendations  Skilled nursing-short term rehab (<3 hours/day) Can patient physically be transported by private vehicle: Yes   Assistance Recommended at Discharge Frequent or constant Supervision/Assistance  Patient can return home with the following A lot of help with walking and/or transfers;A lot of help with bathing/dressing/bathroom;Assistance with cooking/housework;Direct supervision/assist for medications management;Direct supervision/assist for financial management;Assist for  transportation;Help with stairs or ramp for entrance   Equipment Recommendations  None recommended by PT (TBD post-acute)    Recommendations for Other Services       Precautions / Restrictions Precautions Precautions: Fall Precaution Comments: RUQ drain, orthostatic hypotension Restrictions Weight Bearing Restrictions: No     Mobility  Bed Mobility Overal bed mobility: Needs Assistance Bed Mobility: Rolling, Sidelying to Sit Rolling: Min guard Sidelying to sit: Min assist, HOB elevated       General bed mobility comments: cues for log roll technique and minA to rise.    Transfers Overall transfer level: Needs assistance Equipment used: Rolling walker (2 wheels) Transfers: Sit to/from Stand Sit to Stand: Min assist, Mod assist, +2 safety/equipment           General transfer comment: from EOB<>RW, and recliner<>RW, cues for safe UE placement with poor carryover; decreased eccentric control to sit with fatigue needing modA    Ambulation/Gait Ambulation/Gait assistance: Min assist, +2 physical assistance, Mod assist Gait Distance (Feet): 6 Feet (~69f bed>chair, then 687fforward with close chair follow) Assistive device: Rolling walker (2 wheels) Gait Pattern/deviations: Narrow base of support, Shuffle, Decreased stride length, Step-to pattern, Leaning posteriorly (decreased heel strike) Gait velocity: decr     General Gait Details: HR 76-82 bpm, SpO2 WFL on RA; BP stable but c/o fatigue and impulsive to sit, so close chair follow for safety; posterior bias today and needing consistent min to modA to correct      Balance Overall balance assessment: Needs assistance Sitting-balance support: Feet supported, Bilateral upper extremity supported Sitting balance-Leahy Scale: Fair Sitting balance - Comments: static sitting unsupported no LOB   Standing balance support: Single extremity supported, Bilateral upper extremity supported Standing balance-Leahy Scale:  Poor Standing balance comment: posterior LOB upon standing and while walking  Cognition Arousal/Alertness: Awake/alert Behavior During Therapy: Flat affect Overall Cognitive Status: Impaired/Different from baseline Area of Impairment: Problem solving, Awareness, Safety/judgement, Attention, Following commands                   Current Attention Level: Selective   Following Commands: Follows one step commands with increased time Safety/Judgement: Decreased awareness of safety, Decreased awareness of deficits Awareness: Emergent Problem Solving: Requires verbal cues, Decreased initiation, Difficulty sequencing General Comments: Cooperative with encouragement; decreased carryover of cues for safe hand placement, reinforced with pt spouse who was present.        Exercises      General Comments General comments (skin integrity, edema, etc.): BP 107/69 supine and 113/59 seated, SpO2/HR WFL; quick to fatigue but not orthostatic with TED hose donned per BP readings      Pertinent Vitals/Pain Pain Assessment Pain Assessment: Faces Faces Pain Scale: Hurts even more Pain Location: right flank/drain site Pain Descriptors / Indicators: Grimacing, Sharp, Aching Pain Intervention(s): Limited activity within patient's tolerance, Monitored during session, Patient requesting pain meds-RN notified, Repositioned     PT Goals (current goals can now be found in the care plan section) Acute Rehab PT Goals Patient Stated Goal: to get stronger and pain reduced. PT Goal Formulation: With patient/family Time For Goal Achievement: 09/07/22 Progress towards PT goals: Progressing toward goals    Frequency    Min 2X/week      PT Plan Current plan remains appropriate       AM-PAC PT "6 Clicks" Mobility   Outcome Measure  Help needed turning from your back to your side while in a flat bed without using bedrails?: A Little Help needed moving from  lying on your back to sitting on the side of a flat bed without using bedrails?: A Lot (mod sequencing cues for this and all "2" items below) Help needed moving to and from a bed to a chair (including a wheelchair)?: A Lot Help needed standing up from a chair using your arms (e.g., wheelchair or bedside chair)?: A Lot Help needed to walk in hospital room?: A Lot Help needed climbing 3-5 steps with a railing? : Total 6 Click Score: 12    End of Session Equipment Utilized During Treatment: Gait belt Activity Tolerance: Patient limited by fatigue Patient left: with call bell/phone within reach;Other (comment);in chair;with chair alarm set;with family/visitor present (spouse present, heels floated) Nurse Communication: Mobility status;Patient requests pain meds PT Visit Diagnosis: Muscle weakness (generalized) (M62.81);Difficulty in walking, not elsewhere classified (R26.2);Other abnormalities of gait and mobility (R26.89);Pain Pain - Right/Left: Right Pain - part of body:  (flank)     Time: 1547-1610 PT Time Calculation (min) (ACUTE ONLY): 23 min  Charges:  $Gait Training: 8-22 mins $Therapeutic Activity: 8-22 mins                     Yuuki Skeens P., PTA Acute Rehabilitation Services Secure Chat Preferred 9a-5:30pm Office: [place] 09/02/2022, 5:23 PM

## 2022-09-02 NOTE — TOC Progression Note (Signed)
Transition of Care (TOC) - Progression Note    Patient Details  Name: Douglas Hurst. MRN: [id number] Date of Birth: [birth year]  Transition of Care [facility]) CM/SW [place], LCSW Phone Number: 09/02/2022, 3:19 PM  Clinical Narrative:    Pasrr still pending.    Expected Discharge Plan: Skilled Nursing Facility Barriers to Discharge: Ship broker, Continued Medical Work up, Environmental education officer)  Expected Discharge Plan and Services Expected Discharge Plan: [place] In-house Referral: Clinical Social Work   Post Acute Care Choice: [place] Living arrangements for the past 2 months: Single Family Home                                       Social Determinants of Health (SDOH) Interventions    Readmission Risk Interventions     No data to display

## 2022-09-02 NOTE — Progress Notes (Signed)
PROGRESS NOTE        PATIENT DETAILS Name: Douglas Hurst. Age: 81 y.o. Sex: male Date of Birth: 09-10-1941 Admit Date: 07/30/2022 Admitting Physician Clance Boll, MD [id number], [name], MD  Brief Summary: Patient is a 81 y.o.  male with history of PAF on Eliquis, HTN, HLD, COPD, chronic HFrEF (recovered EF)-with recent history of cholecystitis-s/p cholecystectomy but with retained CBD stone.  ERCP here was unsuccessful.  Patient was referred to [facility] underwent advanced endoscopy via transduodenal approach for ERCP on 8/3 at [facility] with Dr. Okey Dupre -he subsequently presented to the ED with severe abdominal pain-was found to have pancreatitis and admitted to the hospitalist service.  Course was complicated by A-fib RVR, pseudocyst formation, subhepatic fluid collection requiring CT-guided drain placement.    Significant events: 8/3>> admit to [facility]-abdominal pain after ERCP at [facility]-found to have acute pancreatitis 8/5>> A-fib RVR-no response to IV Lopressor-blood pressure soft-amiodarone infusion started.  After discussion with GI-heparin started without bolus.  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 8/5>> CT abdomen/pelvis: Resolution of hypoenhancement of the pancreas-persistent inflammatory changes within the retroperitoneum surrounding the head of the pancreas extending into the porta hepatis.  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 8/13>> CT abdomen/pelvis: Increased size of large perihepatic pseudocysts 8/23>> CT abdomen/pelvis: Interval decrease in loculated perihepatic fluid collection after percutaneous drain placement. 9/04>> CT abdomen/pelvis: Mild decrease in  subcapsular collection, findings suggest resolving pancreatitis.  Significant microbiology data: 8/15>> perihepatic fluid culture: No growth  Procedures: 8/15>> CT-guided drain placement right subcapsular fluid collection. 8/16>>Cortak placed (removed 8/30) 8/24>> s/p exchange and size up of perihepatic drain.  Consults: GI Phone consult-cards-Dr Crenshaw IR Palliative care  Subjective: No major issues overnight.  No abdominal pain-awaiting SNF bed.  Remains stable for discharge.  Objective: Vitals: Blood pressure 117/67, pulse 66, temperature 98.2 F (36.8 C), temperature source Oral, resp. rate 13, height '6\' 1"'$  (1.854 m), weight 78.1 kg, SpO2 96 %.   Exam: Gen Exam:Alert awake-not in any distress HEENT:atraumatic, normocephalic Chest: B/L clear to auscultation anteriorly CVS:S1S2 regular Abdomen:soft non tender, non distended Extremities:no edema Neurology: Non focal Skin: no rash   Pertinent Labs/Radiology:    Latest Ref Rng & Units 08/30/2022    3:54 AM 08/29/2022    5:38 AM 08/28/2022    7:46 AM  CBC  WBC 4.0 - 10.5 K/uL 6.8  10.9  15.0   Hemoglobin 13.0 - 17.0 g/dL 10.6  11.2  10.5   Hematocrit 39.0 - 52.0 % 31.5  33.2  32.1   Platelets 150 - 400 K/uL 276  254  277     Lab Results  Component Value Date   NA 135 08/28/2022   K 4.4 08/28/2022   CL 100 08/28/2022   CO2 27 08/28/2022      Assessment/Plan: Acute post ERCP pancreatitis with pseudocyst formation: Overall improved-NG tube removed 8/30-tolerating advancement in diet.    Subhepatic space fluid collection: Likely due to leak from ERCP done via trans duodenal approach.  Drain in place-IR following.  Repeat CT on 9/4 continues to show subhepatic collection-drain remains in place.  IR continues to follow.  PAF with RVR: Due to acute illness-maintaining sinus rhythm-on  amiodarone-and SQ Lovenox.   Once assured that he will not require any further procedures-will transition to [place].  Acute hypoxic  respiratory failure: Due to pleural effusion/atelectasis-improved-on room air.  Continue mobilization.  Encourage incentive spirometry.  AKI: Resolved-likely hemodynamically mediated.  Acute on chronic HFrEF with recovered EF: Volume status stable-no longer on furosemide.  Orthostatic hypotension: Noted on 9/1 with PT-gently hydrated.  Will need to repeat orthostatic vitals over the next few days.  Normocytic anemia: Due to critical/acute illness-no evidence of blood loss.  Follow CBC periodically.  DM-2 (A1c 5.6 on 7/7): CBGs remains on the lower side-decrease Lantus to 8 units-plan is to resume metformin on discharge.   Recent Labs    09/02/22 0328 09/02/22 0822 09/02/22 1229  GLUCAP 89 106* 141*     HTN: BP stable-continue metoprolol.  BPH: Continue Flomax  COPD: Continue bronchodilators  Infrarenal abdominal aortic aneurysm 4 mm: Unchanged from prior study-annual follow-up is required.  Debility/deconditioning: Due to acute illness-SNF planned on discharge.  Nutrition Status: Nutrition Problem: Severe Malnutrition Etiology: chronic illness (COPD, HF, acute pancreatitis) Signs/Symptoms: severe fat depletion, moderate fat depletion, severe muscle depletion, moderate muscle depletion Interventions: Ensure Enlive (each supplement provides 350kcal and 20 grams of protein), MVI    BMI: Estimated body mass index is 22.72 kg/m as calculated from the following:   Height as of this encounter: '6\' 1"'$  (1.854 m).   Weight as of this encounter: 78.1 kg.   Code status:   Code Status: Full Code   DVT Prophylaxis:IV heparin    Family Communication: Spouse at bedside on 9/6   Disposition Plan: Status is: Inpatient Remains inpatient appropriate because: Severe pancreatitis-subhepatic collection-drain in place-repeat CT planned for 9/4-SNF plan-awaiting insurance authorization  Planned Discharge Destination:SNF when bed is available   Diet: Diet Order             DIET  DYS 3 Room service appropriate? Yes; Fluid consistency: Thin  Diet effective now                     Antimicrobial agents: Anti-infectives (From admission, onward)    Start     Dose/Rate Route Frequency Ordered Stop   08/02/22 1600  piperacillin-tazobactam (ZOSYN) IVPB 3.375 g  Status:  Discontinued        3.375 g 12.5 mL/hr over 240 Minutes Intravenous Every 8 hours 08/02/22 1039 08/13/22 1505   08/02/22 1130  piperacillin-tazobactam (ZOSYN) IVPB 4.5 g        4.5 g 200 mL/hr over 30 Minutes Intravenous  Once 08/02/22 1039 08/02/22 1240   07/31/22 0400  meropenem (MERREM) 1 g in sodium chloride 0.9 % 100 mL IVPB  Status:  Discontinued        1 g 200 mL/hr over 30 Minutes Intravenous Every 8 hours 07/31/22 0252 08/02/22 1039   07/30/22 2245  piperacillin-tazobactam (ZOSYN) IVPB 3.375 g  Status:  Discontinued        3.375 g 12.5 mL/hr over 240 Minutes Intravenous Every 8 hours 07/30/22 2239 07/31/22 0239        MEDICATIONS: Scheduled Meds:  acetaminophen  1,000 mg Oral TID   amiodarone  200 mg Oral Daily   enoxaparin (LOVENOX) injection  80 mg Subcutaneous Q12H   feeding supplement  237 mL Oral BID BM   fentaNYL  1 patch Transdermal Q72H   insulin aspart  0-9 Units Subcutaneous Q4H   insulin detemir  8 Units Subcutaneous BID   metoprolol tartrate  25 mg Oral BID  multivitamin with minerals  1 tablet Oral Daily   polyethylene glycol  17 g Oral BID   senna-docusate  1 tablet Oral Daily   sodium chloride flush  5 mL Intracatheter Q8H   tamsulosin  0.8 mg Oral Daily   Continuous Infusions:   PRN Meds:.guaiFENesin-dextromethorphan, ondansetron **OR** ondansetron (ZOFRAN) IV, oxyCODONE, umeclidinium bromide   I have personally reviewed following labs and imaging studies  LABORATORY DATA: CBC: Recent Labs  Lab 08/27/22 0433 08/28/22 0746 08/29/22 0538 08/30/22 0354  WBC 9.2 15.0* 10.9* 6.8  HGB 11.0* 10.5* 11.2* 10.6*  HCT 34.7* 32.1* 33.2* 31.5*  MCV 91.8  90.4 89.5 89.5  PLT 316 277 254 276     Basic Metabolic Panel: Recent Labs  Lab 08/27/22 0433 08/28/22 0746  NA 137 135  K 4.4 4.4  CL 102 100  CO2 29 27  GLUCOSE 84 101*  BUN 24* 23  CREATININE 1.33* 1.13  CALCIUM 9.9 9.7     GFR: Estimated Creatinine Clearance: 58.6 mL/min (by C-G formula based on SCr of 1.13 mg/dL).  Liver Function Tests: Recent Labs  Lab 08/27/22 0433  AST 19  ALT 29  ALKPHOS 72  BILITOT 0.5  PROT 6.3*  ALBUMIN 2.1*    No results for input(s): "LIPASE", "AMYLASE" in the last 168 hours.  No results for input(s): "AMMONIA" in the last 168 hours.  Coagulation Profile: No results for input(s): "INR", "PROTIME" in the last 168 hours.  Cardiac Enzymes: No results for input(s): "CKTOTAL", "CKMB", "CKMBINDEX", "TROPONINI" in the last 168 hours.  BNP (last 3 results) No results for input(s): "PROBNP" in the last 8760 hours.  Lipid Profile: No results for input(s): "CHOL", "HDL", "LDLCALC", "TRIG", "CHOLHDL", "LDLDIRECT" in the last 72 hours.  Thyroid Function Tests: No results for input(s): "TSH", "T4TOTAL", "FREET4", "T3FREE", "THYROIDAB" in the last 72 hours.  Anemia Panel: No results for input(s): "VITAMINB12", "FOLATE", "FERRITIN", "TIBC", "IRON", "RETICCTPCT" in the last 72 hours.  Urine analysis:    Component Value Date/Time   COLORURINE YELLOW 08/18/2022 1011   APPEARANCEUR CLOUDY (A) 08/18/2022 1011   LABSPEC 1.013 08/18/2022 1011   PHURINE 9.0 (H) 08/18/2022 1011   GLUCOSEU NEGATIVE 08/18/2022 [place] 08/18/2022 [place] 08/18/2022 1011   KETONESUR NEGATIVE 08/18/2022 1011   PROTEINUR NEGATIVE 08/18/2022 1011   UROBILINOGEN 4.0 (H) 10/02/2015 2135   NITRITE NEGATIVE 08/18/2022 1011   LEUKOCYTESUR NEGATIVE 08/18/2022 1011    Sepsis Labs: Lactic Acid, Venous    Component Value Date/Time   LATICACIDVEN 6.2 (HH) 07/31/2022 0528    MICROBIOLOGY: No results found for this or any previous  visit (from the past 240 hour(s)).  RADIOLOGY STUDIES/RESULTS: No results found.   LOS: 33 days   Oren Binet, MD  [facility]    To contact the attending provider between 7A-7P or the covering provider during after hours 7P-7A, please log into the web site [email or web address] and access using universal [place] password for that web site. If you do not have the password, please call the hospital operator.  09/02/2022, 12:47 PM

## 2022-09-03 DIAGNOSIS — E43 Unspecified severe protein-calorie malnutrition: Secondary | ICD-10-CM | POA: Diagnosis not present

## 2022-09-03 DIAGNOSIS — K858 Other acute pancreatitis without necrosis or infection: Secondary | ICD-10-CM | POA: Diagnosis not present

## 2022-09-03 DIAGNOSIS — N179 Acute kidney failure, unspecified: Secondary | ICD-10-CM | POA: Diagnosis not present

## 2022-09-03 DIAGNOSIS — I10 Essential (primary) hypertension: Secondary | ICD-10-CM | POA: Diagnosis not present

## 2022-09-03 LAB — GLUCOSE, CAPILLARY
Glucose-Capillary: 82 mg/dL (ref 70–99)
Glucose-Capillary: 93 mg/dL (ref 70–99)
Glucose-Capillary: 122 mg/dL — ABNORMAL HIGH (ref 70–99)
Glucose-Capillary: 160 mg/dL — ABNORMAL HIGH (ref 70–99)
Glucose-Capillary: 149 mg/dL — ABNORMAL HIGH (ref 70–99)
Glucose-Capillary: 109 mg/dL — ABNORMAL HIGH (ref 70–99)

## 2022-09-03 MED ORDER — APIXABAN 5 MG PO TABS
5.0000 mg | ORAL_TABLET | Freq: Two times a day (BID) | ORAL | Status: DC
  Administered 2022-09-03 – 2022-09-08 (×11): 5 mg via ORAL
  Filled 2022-09-03 (×11): qty 1

## 2022-09-03 MED ORDER — INSULIN DETEMIR 100 UNIT/ML ~~LOC~~ SOLN
8.0000 [IU] | Freq: Every day | SUBCUTANEOUS | Status: DC
  Administered 2022-09-04 – 2022-09-07 (×4): 8 [IU] via SUBCUTANEOUS
  Filled 2022-09-03 (×5): qty 0.08

## 2022-09-03 NOTE — TOC Progression Note (Signed)
Transition of Care (TOC) - Progression Note    Patient Details  Name: Douglas Hurst. MRN: [id number] Date of Birth: 1941-09-24  Transition of Care [facility]) CM/SW [place], LCSW Phone Number: 09/03/2022, 3:50 PM  Clinical Narrative:    Case has finally been assigned to a Pasrr screener. CSW will begin insurance authorization process in the event the pasrr comes back by Monday.    Expected Discharge Plan: Skilled Nursing Facility Barriers to Discharge: Ship broker, Continued Medical Work up, Environmental education officer)  Expected Discharge Plan and Services Expected Discharge Plan: [place] In-house Referral: Clinical Social Work   Post Acute Care Choice: [place] Living arrangements for the past 2 months: Single Family Home                                       Social Determinants of Health (SDOH) Interventions    Readmission Risk Interventions     No data to display

## 2022-09-03 NOTE — Progress Notes (Signed)
Physical Therapy Treatment Patient Details Name: Douglas Hurst. MRN: [id number] DOB: [birth year] Today's Date: 09/03/2022   History of Present Illness Patient is a 81 yo male presenting to the ED with severe abdominal pain with radiation to right shoulder on 07/31/22. Patient had underwent ECRP for removal of stones from biliary and pancreatic duct on 8/3. Admitted with acute pancreatitis. Patient with cortrak and Perihepatic drain placement. PMH includes: bipolar d/o DMII, HTN, HLD, GERD, COPD ,  Afib ,diastolic heart failure, AAA with interim history of acute cholecystitis.    PT Comments    Pt received in chair, c/o pain in R flank and lower back despite premedication by RN (tylenol), pt found to be incontinent (unaware), agreeable to therapy session with emphasis on safety with transfers, pre-gait training with RW, seated LE exercises and log roll sequencing. Pt needing up to modA to perform functional mobility tasks this date, pt with symptomatic orthostatic hypotension and bowel incontinence limiting gait progression this date. Pt continues to benefit from PT services to progress toward functional mobility goals.    Recommendations for follow up therapy are one component of a multi-disciplinary discharge planning process, led by the attending physician.  Recommendations may be updated based on patient status, additional functional criteria and insurance authorization.  Follow Up Recommendations  Skilled nursing-short term rehab (<3 hours/day) Can patient physically be transported by private vehicle: Yes   Assistance Recommended at Discharge Frequent or constant Supervision/Assistance  Patient can return home with the following A lot of help with walking and/or transfers;A lot of help with bathing/dressing/bathroom;Assistance with cooking/housework;Direct supervision/assist for medications management;Direct supervision/assist for financial management;Assist for transportation;Help with stairs  or ramp for entrance   Equipment Recommendations  None recommended by PT (TBD post-acute)    Recommendations for Other Services       Precautions / Restrictions Precautions Precautions: Fall Precaution Comments: RUQ drain, orthostatic hypotension, incontinence (after meds given for constipation) Restrictions Weight Bearing Restrictions: No     Mobility  Bed Mobility Overal bed mobility: Needs Assistance Bed Mobility: Rolling, Sit to Sidelying Rolling: Min guard       Sit to sidelying: Min assist, HOB elevated General bed mobility comments: cues for log roll technique and BLE assist.    Transfers Overall transfer level: Needs assistance Equipment used: Rolling walker (2 wheels) Transfers: Sit to/from Stand Sit to Stand: Min assist, Mod assist   Step pivot transfers: Min assist       General transfer comment: chair>EOB, then EOB<>RW with sidesteps x4 each transfer    Ambulation/Gait               General Gait Details: defer due to active bowel incontinence     Balance Overall balance assessment: Needs assistance Sitting-balance support: Feet supported, Bilateral upper extremity supported Sitting balance-Leahy Scale: Fair Sitting balance - Comments: static sitting unsupported no LOB   Standing balance support: Single extremity supported, Bilateral upper extremity supported Standing balance-Leahy Scale: Poor Standing balance comment: posterior lean at times needing external assist and RW                            Cognition Arousal/Alertness: Awake/alert Behavior During Therapy: Flat affect Overall Cognitive Status: Impaired/Different from baseline Area of Impairment: Problem solving, Awareness, Safety/judgement, Attention, Following commands                   Current Attention Level: Selective   Following Commands: Follows one step commands  with increased time Safety/Judgement: Decreased awareness of safety, Decreased awareness  of deficits Awareness: Emergent Problem Solving: Requires verbal cues, Decreased initiation, Difficulty sequencing General Comments: Cooperative with encouragement; decreased carryover of cues for safe hand placement. Pt found to be incontinent while seated in chair after taking miralax but seemingly unaware, did not notify staff.        Exercises Other Exercises Other Exercises: seated BLE AROM: hip flexion, LAQ, ankle pumps x10-15 reps ea Other Exercises: static standing x 3 mins for BLE strengthening    General Comments General comments (skin integrity, edema, etc.): BP 94/56 (63) seated EOB after pivot transfer; BP 127/68 ~3 mins after return to supine; SpO2 WFL on RA, received on 2L [place] but doffed; sensor at times with inaccurate reading when pt moving but when finger straight and good waveform, SpO2 95% and higher on RA.      Pertinent Vitals/Pain Pain Assessment Pain Assessment: Faces Faces Pain Scale: Hurts whole lot Pain Location: right flank/drain site Pain Descriptors / Indicators: Grimacing, Sharp, Aching Pain Intervention(s): Monitored during session, Limited activity within patient's tolerance, Premedicated before session (RN gave Tylenol prior)           PT Goals (current goals can now be found in the care plan section) Acute Rehab PT Goals Patient Stated Goal: to get stronger and pain reduced. PT Goal Formulation: With patient/family Time For Goal Achievement: 09/07/22 Progress towards PT goals: Progressing toward goals (slow progress)    Frequency    Min 2X/week      PT Plan Current plan remains appropriate       AM-PAC PT "6 Clicks" Mobility   Outcome Measure  Help needed turning from your back to your side while in a flat bed without using bedrails?: A Little Help needed moving from lying on your back to sitting on the side of a flat bed without using bedrails?: A Lot (mod sequencing cues for this and all "2" items below) Help needed moving to and  from a bed to a chair (including a wheelchair)?: A Lot Help needed standing up from a chair using your arms (e.g., wheelchair or bedside chair)?: A Lot Help needed to walk in hospital room?: A Lot Help needed climbing 3-5 steps with a railing? : Total 6 Click Score: 12    End of Session Equipment Utilized During Treatment: Gait belt Activity Tolerance: Patient limited by fatigue;Treatment limited secondary to medical complications (Comment);Other (comment) (bowel incontinence and orthostasis limiting standing tolerance) Patient left: with call bell/phone within reach;Other (comment);with family/visitor present;in bed;with bed alarm set (spouse present) Nurse Communication: Mobility status;Other (comment) (pt having BM in bed, needs lower body bath once done (did clean him up but pt with continued incontinence)) PT Visit Diagnosis: Muscle weakness (generalized) (M62.81);Difficulty in walking, not elsewhere classified (R26.2);Other abnormalities of gait and mobility (R26.89);Pain Pain - Right/Left: Right Pain - part of body:  (flank)     Time: 0277-4128 PT Time Calculation (min) (ACUTE ONLY): 29 min  Charges:  $Therapeutic Exercise: 8-22 mins $Therapeutic Activity: 8-22 mins                     Timoteo Carreiro P., PTA Acute Rehabilitation Services Secure Chat Preferred 9a-5:30pm Office: [place] 09/03/2022, 6:22 PM

## 2022-09-03 NOTE — Progress Notes (Signed)
PROGRESS NOTE        PATIENT DETAILS Name: Douglas Hurst. Age: 81 y.o. Sex: male Date of Birth: [birth year] Admit Date: 07/30/2022 Admitting Physician Clance Boll, MD ERD:[id number], [name], MD  Brief Summary: Patient is a 81 y.o.  male with history of PAF on Eliquis, HTN, HLD, COPD, chronic HFrEF (recovered EF)-with recent history of cholecystitis-s/p cholecystectomy but with retained CBD stone.  ERCP here was unsuccessful.  Patient was referred to [facility] underwent advanced endoscopy via transduodenal approach for ERCP on 8/3 at [facility] with Dr. Okey Dupre -he subsequently presented to the ED with severe abdominal pain-was found to have pancreatitis and admitted to the hospitalist service.  Course was complicated by A-fib RVR, pseudocyst formation, subhepatic fluid collection requiring CT-guided drain placement.    Significant events: 8/3>> admit to [facility]-abdominal pain after ERCP at [facility]-found to have acute pancreatitis 8/5>> A-fib RVR-no response to IV Lopressor-blood pressure soft-amiodarone infusion started.  After discussion with GI-heparin started without bolus.  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 8/5>> CT abdomen/pelvis: Resolution of hypoenhancement of the pancreas-persistent inflammatory changes within the retroperitoneum surrounding the head of the pancreas extending into the porta hepatis.  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 8/13>> CT abdomen/pelvis: Increased size of large perihepatic pseudocysts 8/23>> CT abdomen/pelvis: Interval decrease in loculated perihepatic fluid collection after percutaneous drain placement. 9/04>> CT abdomen/pelvis: Mild decrease in  subcapsular collection, findings suggest resolving pancreatitis.  Significant microbiology data: 8/15>> perihepatic fluid culture: No growth  Procedures: 8/15>> CT-guided drain placement right subcapsular fluid collection. 8/16>>Cortak placed (removed 8/30) 8/24>> s/p exchange and size up of perihepatic drain.  Consults: GI Phone consult-cards-Dr Crenshaw IR Palliative care  Subjective: No major issues overnight-tolerating diet-denies any abdominal pain.  Spouse at bedside.  Awaiting SNF bed.  Objective: Vitals: Blood pressure (!) 111/57, pulse 71, temperature 98.1 F (36.7 C), temperature source Oral, resp. rate 12, height '6\' 1"'$  (1.854 m), weight 78.1 kg, SpO2 97 %.   Exam: Gen Exam:Alert awake-not in any distress HEENT:atraumatic, normocephalic Chest: B/L clear to auscultation anteriorly CVS:S1S2 regular Abdomen:soft non tender, non distended Extremities:no edema Neurology: Non focal Skin: no rash   Pertinent Labs/Radiology:    Latest Ref Rng & Units 08/30/2022    3:54 AM 08/29/2022    5:38 AM 08/28/2022    7:46 AM  CBC  WBC 4.0 - 10.5 K/uL 6.8  10.9  15.0   Hemoglobin 13.0 - 17.0 g/dL 10.6  11.2  10.5   Hematocrit 39.0 - 52.0 % 31.5  33.2  32.1   Platelets 150 - 400 K/uL 276  254  277     Lab Results  Component Value Date   NA 135 08/28/2022   K 4.4 08/28/2022   CL 100 08/28/2022   CO2 27 08/28/2022      Assessment/Plan: Acute post ERCP pancreatitis with pseudocyst formation: Overall improved-NG tube removed 8/30-tolerating advancement in diet.    Subhepatic space fluid collection: Likely due to leak from ERCP done via trans duodenal approach.  Drain in place-IR following.  Repeat CT on 9/4 continues to show subhepatic collection-drain remains in place.  IR continues to follow-plans to discharge with drain-IR will arrange for outpatient  follow-up.  Continue 3 times daily flushes.  PAF with RVR: Due to acute illness-maintaining sinus rhythm-on amiodarone-and SQ  Lovenox.   Suspect we can now switch to a DOAC.  Acute hypoxic respiratory failure: Due to pleural effusion/atelectasis-improved-on room air.  Continue mobilization.  Encourage incentive spirometry.  AKI: Resolved-likely hemodynamically mediated.  Acute on chronic HFrEF with recovered EF: Volume status stable-no longer on furosemide.  Orthostatic hypotension: Noted on 9/1 with PT-gently hydrated.    Normocytic anemia: Due to critical/acute illness-no evidence of blood loss.  Follow CBC periodically.  DM-2 (A1c 5.6 on 7/7): CBGs remain on the lower side-decrease Lantus to daily dosing-resume metformin on discharge.  Recent Labs    09/03/22 0335 09/03/22 0830 09/03/22 1125  GLUCAP 82 93 122*     HTN: BP stable-continue metoprolol.  BPH: Continue Flomax  COPD: Continue bronchodilators  Infrarenal abdominal aortic aneurysm 4 mm: Unchanged from prior study-annual follow-up is required.  Debility/deconditioning: Due to acute illness-SNF planned on discharge.  Nutrition Status: Nutrition Problem: Severe Malnutrition Etiology: chronic illness (COPD, HF, acute pancreatitis) Signs/Symptoms: severe fat depletion, moderate fat depletion, severe muscle depletion, moderate muscle depletion Interventions: Ensure Enlive (each supplement provides 350kcal and 20 grams of protein), MVI    BMI: Estimated body mass index is 22.72 kg/m as calculated from the following:   Height as of this encounter: '6\' 1"'$  (1.854 m).   Weight as of this encounter: 78.1 kg.   Code status:   Code Status: Full Code   DVT Prophylaxis:IV heparin    Family Communication: Spouse at bedside on 9/7   Disposition Plan: Status is: Inpatient Remains inpatient appropriate because: Severe pancreatitis-subhepatic collection-drain in place-repeat CT planned for 9/4-SNF plan-awaiting insurance authorization  Planned Discharge Destination:SNF when bed is available   Diet: Diet Order             DIET DYS 3  Room service appropriate? Yes; Fluid consistency: Thin  Diet effective now                     Antimicrobial agents: Anti-infectives (From admission, onward)    Start     Dose/Rate Route Frequency Ordered Stop   08/02/22 1600  piperacillin-tazobactam (ZOSYN) IVPB 3.375 g  Status:  Discontinued        3.375 g 12.5 mL/hr over 240 Minutes Intravenous Every 8 hours 08/02/22 1039 08/13/22 1505   08/02/22 1130  piperacillin-tazobactam (ZOSYN) IVPB 4.5 g        4.5 g 200 mL/hr over 30 Minutes Intravenous  Once 08/02/22 1039 08/02/22 1240   07/31/22 0400  meropenem (MERREM) 1 g in sodium chloride 0.9 % 100 mL IVPB  Status:  Discontinued        1 g 200 mL/hr over 30 Minutes Intravenous Every 8 hours 07/31/22 0252 08/02/22 1039   07/30/22 2245  piperacillin-tazobactam (ZOSYN) IVPB 3.375 g  Status:  Discontinued        3.375 g 12.5 mL/hr over 240 Minutes Intravenous Every 8 hours 07/30/22 2239 07/31/22 0239        MEDICATIONS: Scheduled Meds:  acetaminophen  1,000 mg Oral TID   amiodarone  200 mg Oral Daily   enoxaparin (LOVENOX) injection  80 mg Subcutaneous Q12H   feeding supplement  237 mL Oral BID BM   fentaNYL  1 patch Transdermal Q72H   insulin aspart  0-9 Units Subcutaneous Q4H   insulin detemir  8 Units Subcutaneous BID   metoprolol tartrate  25 mg Oral BID  multivitamin with minerals  1 tablet Oral Daily   polyethylene glycol  17 g Oral BID   senna-docusate  1 tablet Oral Daily   sodium chloride flush  5 mL Intracatheter Q8H   tamsulosin  0.8 mg Oral Daily   Continuous Infusions:   PRN Meds:.guaiFENesin-dextromethorphan, ondansetron **OR** ondansetron (ZOFRAN) IV, oxyCODONE, umeclidinium bromide   I have personally reviewed following labs and imaging studies  LABORATORY DATA: CBC: Recent Labs  Lab 08/28/22 0746 08/29/22 0538 08/30/22 0354  WBC 15.0* 10.9* 6.8  HGB 10.5* 11.2* 10.6*  HCT 32.1* 33.2* 31.5*  MCV 90.4 89.5 89.5  PLT 277 254 276      Basic Metabolic Panel: Recent Labs  Lab 08/28/22 0746  NA 135  K 4.4  CL 100  CO2 27  GLUCOSE 101*  BUN 23  CREATININE 1.13  CALCIUM 9.7     GFR: Estimated Creatinine Clearance: 58.6 mL/min (by C-G formula based on SCr of 1.13 mg/dL).  Liver Function Tests: No results for input(s): "AST", "ALT", "ALKPHOS", "BILITOT", "PROT", "ALBUMIN" in the last 168 hours.  No results for input(s): "LIPASE", "AMYLASE" in the last 168 hours.  No results for input(s): "AMMONIA" in the last 168 hours.  Coagulation Profile: No results for input(s): "INR", "PROTIME" in the last 168 hours.  Cardiac Enzymes: No results for input(s): "CKTOTAL", "CKMB", "CKMBINDEX", "TROPONINI" in the last 168 hours.  BNP (last 3 results) No results for input(s): "PROBNP" in the last 8760 hours.  Lipid Profile: No results for input(s): "CHOL", "HDL", "LDLCALC", "TRIG", "CHOLHDL", "LDLDIRECT" in the last 72 hours.  Thyroid Function Tests: No results for input(s): "TSH", "T4TOTAL", "FREET4", "T3FREE", "THYROIDAB" in the last 72 hours.  Anemia Panel: No results for input(s): "VITAMINB12", "FOLATE", "FERRITIN", "TIBC", "IRON", "RETICCTPCT" in the last 72 hours.  Urine analysis:    Component Value Date/Time   COLORURINE YELLOW 08/18/2022 1011   APPEARANCEUR CLOUDY (A) 08/18/2022 1011   LABSPEC 1.013 08/18/2022 1011   PHURINE 9.0 (H) 08/18/2022 1011   GLUCOSEU NEGATIVE 08/18/2022 [place] 08/18/2022 [place] 08/18/2022 1011   KETONESUR NEGATIVE 08/18/2022 1011   PROTEINUR NEGATIVE 08/18/2022 1011   UROBILINOGEN 4.0 (H) 10/02/2015 2135   NITRITE NEGATIVE 08/18/2022 1011   LEUKOCYTESUR NEGATIVE 08/18/2022 1011    Sepsis Labs: Lactic Acid, Venous    Component Value Date/Time   LATICACIDVEN 6.2 (HH) 07/31/2022 0528    MICROBIOLOGY: No results found for this or any previous visit (from the past 240 hour(s)).  RADIOLOGY STUDIES/RESULTS: No results found.    LOS: 34 days   Oren Binet, MD  [facility]    To contact the attending provider between 7A-7P or the covering provider during after hours 7P-7A, please log into the web site [email or web address] and access using universal [place] password for that web site. If you do not have the password, please call the hospital operator.  09/03/2022, 11:40 AM

## 2022-09-04 DIAGNOSIS — I10 Essential (primary) hypertension: Secondary | ICD-10-CM | POA: Diagnosis not present

## 2022-09-04 DIAGNOSIS — E43 Unspecified severe protein-calorie malnutrition: Secondary | ICD-10-CM | POA: Diagnosis not present

## 2022-09-04 DIAGNOSIS — N179 Acute kidney failure, unspecified: Secondary | ICD-10-CM | POA: Diagnosis not present

## 2022-09-04 DIAGNOSIS — K858 Other acute pancreatitis without necrosis or infection: Secondary | ICD-10-CM | POA: Diagnosis not present

## 2022-09-04 LAB — GLUCOSE, CAPILLARY
Glucose-Capillary: 100 mg/dL — ABNORMAL HIGH (ref 70–99)
Glucose-Capillary: 90 mg/dL (ref 70–99)
Glucose-Capillary: 132 mg/dL — ABNORMAL HIGH (ref 70–99)
Glucose-Capillary: 154 mg/dL — ABNORMAL HIGH (ref 70–99)
Glucose-Capillary: 154 mg/dL — ABNORMAL HIGH (ref 70–99)
Glucose-Capillary: 132 mg/dL — ABNORMAL HIGH (ref 70–99)

## 2022-09-04 NOTE — Progress Notes (Signed)
PROGRESS NOTE        PATIENT DETAILS Name: Douglas Hurst. Age: 81 y.o. Sex: male Date of Birth: 05-Jan-1941 Admit Date: 07/30/2022 Admitting Physician Clance Boll, MD MOQ:[id number], [name], MD  Brief Summary: Patient is a 81 y.o.  male with history of PAF on Eliquis, HTN, HLD, COPD, chronic HFrEF (recovered EF)-with recent history of cholecystitis-s/p cholecystectomy but with retained CBD stone.  ERCP here was unsuccessful.  Patient was referred to [facility] underwent advanced endoscopy via transduodenal approach for ERCP on 8/3 at [facility] with Dr. Okey Dupre -he subsequently presented to the ED with severe abdominal pain-was found to have pancreatitis and admitted to the hospitalist service.  Course was complicated by A-fib RVR, pseudocyst formation, subhepatic fluid collection requiring CT-guided drain placement.    Significant events: 8/3>> admit to [facility]-abdominal pain after ERCP at [facility]-found to have acute pancreatitis 8/5>> A-fib RVR-no response to IV Lopressor-blood pressure soft-amiodarone infusion started.  After discussion with GI-heparin started without bolus.  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 8/5>> CT abdomen/pelvis: Resolution of hypoenhancement of the pancreas-persistent inflammatory changes within the retroperitoneum surrounding the head of the pancreas extending into the porta hepatis.  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 8/13>> CT abdomen/pelvis: Increased size of large perihepatic pseudocysts 8/23>> CT abdomen/pelvis: Interval decrease in loculated perihepatic fluid collection after percutaneous drain placement. 9/04>> CT abdomen/pelvis: Mild decrease in  subcapsular collection, findings suggest resolving pancreatitis.  Significant microbiology data: 8/15>> perihepatic fluid culture: No growth  Procedures: 8/15>> CT-guided drain placement right subcapsular fluid collection. 8/16>>Cortak placed (removed 8/30) 8/24>> s/p exchange and size up of perihepatic drain.  Consults: GI Phone consult-cards-Dr Crenshaw IR Palliative care  Subjective: Appetite continues to be erratic-sometimes really good-yesterday it was more Per spouse.  No major issues overnight.  Awaiting SNF bed.  Objective: Vitals: Blood pressure (!) 111/57, pulse 67, temperature 97.9 F (36.6 C), resp. rate 17, height '6\' 1"'$  (1.854 m), weight 80 kg, SpO2 91 %.   Exam: Gen Exam:Alert awake-not in any distress HEENT:atraumatic, normocephalic Chest: B/L clear to auscultation anteriorly CVS:S1S2 regular Abdomen:soft non tender, non distended Extremities:no edema Neurology: Non focal Skin: no rash   Pertinent Labs/Radiology:    Latest Ref Rng & Units 08/30/2022    3:54 AM 08/29/2022    5:38 AM 08/28/2022    7:46 AM  CBC  WBC 4.0 - 10.5 K/uL 6.8  10.9  15.0   Hemoglobin 13.0 - 17.0 g/dL 10.6  11.2  10.5   Hematocrit 39.0 - 52.0 % 31.5  33.2  32.1   Platelets 150 - 400 K/uL 276  254  277     Lab Results  Component Value Date   NA 135 08/28/2022   K 4.4 08/28/2022   CL 100 08/28/2022   CO2 27 08/28/2022      Assessment/Plan: Acute post ERCP pancreatitis with pseudocyst formation: Overall improved-NG tube removed 8/30-tolerating advancement in diet.    Subhepatic space fluid collection: Likely due to leak from ERCP done via trans duodenal approach.  Drain in place-IR following.  Repeat CT on 9/4 continues to show subhepatic collection-drain remains in place.  IR continues to follow-plans to discharge with drain-IR will arrange  for outpatient follow-up.  Continue 3 times daily flushes.  PAF with RVR: Due to acute illness-maintaining sinus rhythm-on amiodarone-and  SQ Lovenox.   Suspect we can now switch to a DOAC.  Acute hypoxic respiratory failure: Due to pleural effusion/atelectasis-improved-on room air.  Continue mobilization.  Encourage incentive spirometry.  AKI: Resolved-likely hemodynamically mediated.  Acute on chronic HFrEF with recovered EF: Volume status stable-no longer on furosemide.  Orthostatic hypotension: Noted again by PT on 9/7-he was asymptomatic.  He has been intermittently hydrated.  Normocytic anemia: Due to critical/acute illness-no evidence of blood loss.  Follow CBC periodically.  DM-2 (A1c 5.6 on 7/7): CBGs remain stable-continue 8 units of Levemir/SSI-resume metformin on discharge.   Recent Labs    09/04/22 0304 09/04/22 0757 09/04/22 1116  GLUCAP 100* 90 132*     HTN: BP stable-continue metoprolol.  BPH: Continue Flomax  COPD: Continue bronchodilators  Infrarenal abdominal aortic aneurysm 4 mm: Unchanged from prior study-annual follow-up is required.  Debility/deconditioning: Due to acute illness-SNF planned on discharge.  Nutrition Status: Nutrition Problem: Severe Malnutrition Etiology: chronic illness (COPD, HF, acute pancreatitis) Signs/Symptoms: severe fat depletion, moderate fat depletion, severe muscle depletion, moderate muscle depletion Interventions: Ensure Enlive (each supplement provides 350kcal and 20 grams of protein), MVI    BMI: Estimated body mass index is 23.27 kg/m as calculated from the following:   Height as of this encounter: '6\' 1"'$  (1.854 m).   Weight as of this encounter: 80 kg.   Code status:   Code Status: Full Code   DVT Prophylaxis:IV heparinapixaban (ELIQUIS) tablet 5 mg     Family Communication: Spouse at bedside on 9/8   Disposition Plan: Status is: Inpatient Remains inpatient appropriate because: Severe pancreatitis-subhepatic collection-drain in place-repeat CT planned for 9/4-SNF plan-awaiting insurance authorization  Planned Discharge Destination:SNF when  bed is available   Diet: Diet Order             DIET DYS 3 Room service appropriate? Yes; Fluid consistency: Thin  Diet effective now                     Antimicrobial agents: Anti-infectives (From admission, onward)    Start     Dose/Rate Route Frequency Ordered Stop   08/02/22 1600  piperacillin-tazobactam (ZOSYN) IVPB 3.375 g  Status:  Discontinued        3.375 g 12.5 mL/hr over 240 Minutes Intravenous Every 8 hours 08/02/22 1039 08/13/22 1505   08/02/22 1130  piperacillin-tazobactam (ZOSYN) IVPB 4.5 g        4.5 g 200 mL/hr over 30 Minutes Intravenous  Once 08/02/22 1039 08/02/22 1240   07/31/22 0400  meropenem (MERREM) 1 g in sodium chloride 0.9 % 100 mL IVPB  Status:  Discontinued        1 g 200 mL/hr over 30 Minutes Intravenous Every 8 hours 07/31/22 0252 08/02/22 1039   07/30/22 2245  piperacillin-tazobactam (ZOSYN) IVPB 3.375 g  Status:  Discontinued        3.375 g 12.5 mL/hr over 240 Minutes Intravenous Every 8 hours 07/30/22 2239 07/31/22 0239        MEDICATIONS: Scheduled Meds:  acetaminophen  1,000 mg Oral TID   amiodarone  200 mg Oral Daily   apixaban  5 mg Oral BID   feeding supplement  237 mL Oral BID BM   fentaNYL  1 patch Transdermal Q72H   insulin aspart  0-9 Units Subcutaneous Q4H   insulin detemir  8 Units Subcutaneous QHS  metoprolol tartrate  25 mg Oral BID   multivitamin with minerals  1 tablet Oral Daily   polyethylene glycol  17 g Oral BID   senna-docusate  1 tablet Oral Daily   sodium chloride flush  5 mL Intracatheter Q8H   tamsulosin  0.8 mg Oral Daily   Continuous Infusions:   PRN Meds:.guaiFENesin-dextromethorphan, ondansetron **OR** ondansetron (ZOFRAN) IV, oxyCODONE, umeclidinium bromide   I have personally reviewed following labs and imaging studies  LABORATORY DATA: CBC: Recent Labs  Lab 08/29/22 0538 08/30/22 0354  WBC 10.9* 6.8  HGB 11.2* 10.6*  HCT 33.2* 31.5*  MCV 89.5 89.5  PLT 254 276     Basic  Metabolic Panel: No results for input(s): "NA", "K", "CL", "CO2", "GLUCOSE", "BUN", "CREATININE", "CALCIUM", "MG", "PHOS" in the last 168 hours.   GFR: Estimated Creatinine Clearance: 59.9 mL/min (by C-G formula based on SCr of 1.13 mg/dL).  Liver Function Tests: No results for input(s): "AST", "ALT", "ALKPHOS", "BILITOT", "PROT", "ALBUMIN" in the last 168 hours.  No results for input(s): "LIPASE", "AMYLASE" in the last 168 hours.  No results for input(s): "AMMONIA" in the last 168 hours.  Coagulation Profile: No results for input(s): "INR", "PROTIME" in the last 168 hours.  Cardiac Enzymes: No results for input(s): "CKTOTAL", "CKMB", "CKMBINDEX", "TROPONINI" in the last 168 hours.  BNP (last 3 results) No results for input(s): "PROBNP" in the last 8760 hours.  Lipid Profile: No results for input(s): "CHOL", "HDL", "LDLCALC", "TRIG", "CHOLHDL", "LDLDIRECT" in the last 72 hours.  Thyroid Function Tests: No results for input(s): "TSH", "T4TOTAL", "FREET4", "T3FREE", "THYROIDAB" in the last 72 hours.  Anemia Panel: No results for input(s): "VITAMINB12", "FOLATE", "FERRITIN", "TIBC", "IRON", "RETICCTPCT" in the last 72 hours.  Urine analysis:    Component Value Date/Time   COLORURINE YELLOW 08/18/2022 1011   APPEARANCEUR CLOUDY (A) 08/18/2022 1011   LABSPEC 1.013 08/18/2022 1011   PHURINE 9.0 (H) 08/18/2022 1011   GLUCOSEU NEGATIVE 08/18/2022 [place] 08/18/2022 [place] 08/18/2022 1011   KETONESUR NEGATIVE 08/18/2022 1011   PROTEINUR NEGATIVE 08/18/2022 1011   UROBILINOGEN 4.0 (H) 10/02/2015 2135   NITRITE NEGATIVE 08/18/2022 1011   LEUKOCYTESUR NEGATIVE 08/18/2022 1011    Sepsis Labs: Lactic Acid, Venous    Component Value Date/Time   LATICACIDVEN 6.2 (HH) 07/31/2022 0528    MICROBIOLOGY: No results found for this or any previous visit (from the past 240 hour(s)).  RADIOLOGY STUDIES/RESULTS: No results found.   LOS: 35 days    Oren Binet, MD  [facility]    To contact the attending provider between 7A-7P or the covering provider during after hours 7P-7A, please log into the web site [email or web address] and access using universal [place] password for that web site. If you do not have the password, please call the hospital operator.  09/04/2022, 1:00 PM

## 2022-09-04 NOTE — Progress Notes (Signed)
Occupational Therapy Treatment Patient Details Name: Douglas Hurst. MRN: [id number] DOB: 05-01-1941 Today's Date: 09/04/2022   History of present illness Patient is a 81 yo male presenting to the ED with severe abdominal pain with radiation to right shoulder on 07/31/22. Patient had underwent ECRP for removal of stones from biliary and pancreatic duct on 8/3. Admitted with acute pancreatitis. Patient with cortrak and Perihepatic drain placement. PMH includes: bipolar d/o DMII, HTN, HLD, GERD, COPD ,  Afib ,diastolic heart failure, AAA with interim history of acute cholecystitis.   OT comments  Pt in bed upon therapy arrival with wife present at bedside. Pt agreeable to participate in OT tx session. Initially, plan for session was to transfer from bed to recliner. Due to incontinence of BM (pt was unaware until standing), transfer to [facility] was performed to finish. Toilet hygiene required total assist due to BM consistency. SpO2 and BP was monitored throughout session due to present history of orthostatic HTN. (Pt had thigh high TEDS donned during session). Decreased activity tolerance and endurance was noted and was effected by change in BP. More than typical amount of rest breaks were provided to allow BP to stabilize and to assist with activity tolerance. Pt was able to complete sit to stands and functional transfers with only Min Assist using RW although required frequent sitting rest breaks. Longest time frame for standing tolerance during session was approximately 3-4 minutes using RW for balance. OT will continue to follow patient acutely.   Recommendations for follow up therapy are one component of a multi-disciplinary discharge planning process, led by the attending physician.  Recommendations may be updated based on patient status, additional functional criteria and insurance authorization.    Follow Up Recommendations  Skilled nursing-short term rehab (<3 hours/day)    Assistance Recommended  at Discharge Intermittent Supervision/Assistance  Patient can return home with the following  Assist for transportation;Help with stairs or ramp for entrance;Direct supervision/assist for medications management;A lot of help with walking and/or transfers;A lot of help with bathing/dressing/bathroom;Assistance with cooking/housework   Equipment Recommendations  Other (comment) (defer to next venue of care)       Precautions / Restrictions Precautions Precautions: Fall Precaution Comments: RUQ drain, orthostatic hypotension, stool incontinence, SPO2 does not accurate read for patient Restrictions Weight Bearing Restrictions: No       Mobility Bed Mobility Overal bed mobility: Needs Assistance Bed Mobility: Rolling, Supine to Sit Rolling: Min guard   Supine to sit: Mod assist, HOB elevated       Patient Response: Flat affect  Transfers Overall transfer level: Needs assistance Equipment used: Rolling walker (2 wheels) Transfers: Sit to/from Stand, Bed to chair/wheelchair/BSC Sit to Stand: Min assist, From elevated surface     Step pivot transfers: Min assist, From elevated surface     General transfer comment: Completed functional transfers from bed to [facility] and back to bed with use of RW.     Balance Overall balance assessment: Mild deficits observed, not formally tested           ADL either performed or assessed with clinical judgement   ADL       Grooming: Oral care;Brushing hair;Set up;Sitting           Upper Body Dressing : Total assistance;Sitting Upper Body Dressing Details (indicate cue type and reason): changed hospital gown     Toilet Transfer: Minimal assistance;BSC/3in1;Rolling walker (2 wheels);Stand-pivot;Cueing for sequencing;Cueing for safety   Toileting- Clothing Manipulation and Hygiene: Total assistance;Sit to/from stand;Bed level  Toileting - Clothing Manipulation Details (indicate cue type and reason): Pt required ~5 sitting rest breaks  during toilet hygiene due to loose stool. A small amount of hygiene was completed once supine in bed that was missed also.     Functional mobility during ADLs: Minimal assistance;Rolling walker (2 wheels)        Cognition Arousal/Alertness: Awake/alert Behavior During Therapy: Flat affect Overall Cognitive Status: Impaired/Different from baseline Area of Impairment: Safety/judgement, Awareness     General Comments: required VC and tactile cues each time prior to standing for hand placement              General Comments BP during session: HOB elevated prior to transitioning to EOB: 119/59 (2:32PM). Seated on EOB for 3 minutes: 107/50 (2:39PM). Immediately sitting after standing for more than 3 minutes: 106/58. Immediately after transitioning from seated to supine in bed: 130/75 (3:17PM) SpO2 constantly fluctuated from 95% to sometimes as low as 79%. Low reading was not accurate during session.    Pertinent Vitals/ Pain       Pain Assessment Pain Assessment: No/denies pain Pain Score: 0-No pain         Frequency  Min 2X/week        Progress Toward Goals  OT Goals(current goals can now be found in the care plan section)  Progress towards OT goals: Progressing toward goals     Plan Discharge plan remains appropriate;Frequency remains appropriate       AM-PAC OT "6 Clicks" Daily Activity     Outcome Measure   Help from another person eating meals?: A Little Help from another person taking care of personal grooming?: A Little Help from another person toileting, which includes using toliet, bedpan, or urinal?: Total Help from another person bathing (including washing, rinsing, drying)?: A Lot Help from another person to put on and taking off regular upper body clothing?: A Little Help from another person to put on and taking off regular lower body clothing?: A Lot 6 Click Score: 14    End of Session Equipment Utilized During Treatment: Rolling walker (2  wheels)  OT Visit Diagnosis: Unsteadiness on feet (R26.81);Muscle weakness (generalized) (M62.81);Other symptoms and signs involving cognitive function;Pain   Activity Tolerance Treatment limited secondary to medical complications (Comment);Patient limited by fatigue (orthostatic HTN)   Patient Left in bed;with call bell/phone within reach;with bed alarm set;with family/visitor present           Time: 4259-5638 OT Time Calculation (min): 58 min  Charges: OT General Charges $OT Visit: 1 Visit OT Treatments $Self Care/Home Management : 23-37 mins $Therapeutic Activity: 23-37 mins  Ailene Ravel, OTR/L,CBIS  Supplemental OT - [facility] and [facility]   Beverly Suriano, Clarene Duke 09/04/2022, 4:22 PM

## 2022-09-04 NOTE — TOC Progression Note (Signed)
Transition of Care (TOC) - Progression Note    Patient Details  Name: Douglas Hurst. MRN: [id number] Date of Birth: 04-06-1941  Transition of Care [facility]) CM/SW Contact  Barton Fanny, [place] Work Phone Number: 09/04/2022, 3:59 PM  Clinical Narrative:    MSW intern spoke with patient and patient's wife at bedside to let them know the PASSR has been assigned to a worker who will be in touch with the family and meet face to face. Patient and patient's wife was very grateful for an update.   Expected Discharge Plan: [place] Barriers to Discharge: Continued Medical Work up  Expected Discharge Plan and Services Expected Discharge Plan: [place] In-house Referral: Clinical Social Work   Post Acute Care Choice: [place] Living arrangements for the past 2 months: Single Family Home                                       Social Determinants of Health (SDOH) Interventions    Readmission Risk Interventions     No data to display

## 2022-09-04 NOTE — Progress Notes (Signed)
Referring Physician(s): Dr. Aileen Fass  Supervising Physician: Mir, Sharen Heck  Patient Status:  [facility] - In-pt  Chief Complaint: Right hepatic abscess s/p drain placement 8/15 Drain exchange and upsize to 12 Fr on 8/24   Subjective: Patient is in bed, his wife is in the room. He is feeling well today and has had a good appetite today.   Allergies: Patient has no known allergies.  Medications: Prior to Admission medications   Medication Sig Start Date End Date Taking? Authorizing Provider  acetaminophen (TYLENOL) 325 MG tablet Take 2 tablets (650 mg total) by mouth every 6 (six) hours as needed for mild pain (or Fever >/= 101). 04/30/22  Yes Sheikh, Omair Latif, DO  amiodarone (PACERONE) 200 MG tablet Take 1 tablet (200 mg total) by mouth daily. 05/29/22  Yes Loel Dubonnet, NP  atorvastatin (LIPITOR) 40 MG tablet Take 40 mg by mouth every evening.   Yes [provider]  lisinopril-hydrochlorothiazide (ZESTORETIC) 10-12.5 MG tablet Take 0.5 tablets by mouth daily.   Yes [provider]  metFORMIN (GLUCOPHAGE-XR) 500 MG 24 hr tablet Take 500-1,000 mg by mouth See admin instructions. Take 2 tablets (1000 mg) by mouth in the morning and 1 tablet (500 mg) every evening 03/17/22  Yes [provider]  metoprolol tartrate (LOPRESSOR) 25 MG tablet Take 1 tablet (25 mg total) by mouth 2 (two) times daily. 05/29/22  Yes Loel Dubonnet, NP  Multiple Vitamin (MULTIVITAMIN WITH MINERALS) TABS tablet Take 1 tablet by mouth daily.   Yes [provider]  ondansetron (ZOFRAN) 4 MG tablet Take 1 tablet (4 mg total) by mouth every 6 (six) hours as needed for nausea. 04/30/22  Yes Sheikh, Omair Latif, DO  pantoprazole (PROTONIX) 40 MG tablet Take 40 mg by mouth every morning.   Yes [provider]  polyethylene glycol powder (GLYCOLAX/MIRALAX) 17 GM/SCOOP powder Take 17 g by mouth daily. Patient taking differently: Take 17 g by mouth daily as needed for mild  constipation or moderate constipation. 04/30/22  Yes Sheikh, Omair Latif, DO  senna-docusate (SENOKOT-S) 8.6-50 MG tablet Take 1 tablet by mouth at bedtime. Patient taking differently: Take 1 tablet by mouth at bedtime as needed for mild constipation or moderate constipation. 04/30/22  Yes Sheikh, Omair Latif, DO  SPIRIVA RESPIMAT 2.5 MCG/ACT AERS Inhale 2 puffs into the lungs daily as needed (COPD). 06/04/22  Yes [provider]  tadalafil (CIALIS) 20 MG tablet Take 20 mg by mouth every three (3) days as needed for erectile dysfunction. 03/29/22  Yes [provider]  tamsulosin (FLOMAX) 0.4 MG CAPS capsule Take 0.4 mg by mouth daily after supper. 05/13/22  Yes [provider]  apixaban (ELIQUIS) 5 MG TABS tablet Take 1 tablet (5 mg total) by mouth 2 (two) times daily. Patient not taking: Reported on 07/31/2022 05/29/22   Loel Dubonnet, NP  oxyCODONE-acetaminophen (PERCOCET) 5-325 MG tablet Take 1 tablet by mouth every 4 (four) hours as needed for severe pain. Patient not taking: Reported on 07/31/2022 07/14/22 07/14/23  Norm Parcel, PA-C     Vital Signs: BP (!) 111/57 (BP Location: Left Arm)   Pulse 67   Temp 97.9 F (36.6 C)   Resp 17   Ht '6\' 1"'$  (1.854 m)   Wt 176 lb 5.9 oz (80 kg)   SpO2 91%   BMI 23.27 kg/m   Physical Exam Constitutional:      General: He is not in acute distress.    Appearance: He  is underweight.  Pulmonary:     Effort: Pulmonary effort is normal.  Abdominal:     Palpations: Abdomen is soft.     Tenderness: There is no abdominal tenderness.     Comments: RUQ drain to suction. Approximately 5 ml of SS fluid in bulb. Skin insertion site with mild erythema. RN flushed drain this morning without difficulty.   Skin:    General: Skin is warm and dry.  Neurological:     Mental Status: He is alert and oriented to person, place, and time.     Imaging: No results found.  Labs:  CBC: Recent Labs    08/27/22 0433 08/28/22 0746  08/29/22 0538 08/30/22 0354  WBC 9.2 15.0* 10.9* 6.8  HGB 11.0* 10.5* 11.2* 10.6*  HCT 34.7* 32.1* 33.2* 31.5*  PLT 316 277 254 276    COAGS: Recent Labs    04/26/22 0843 08/01/22 1832 08/02/22 0703 08/11/22 0941  INR 1.3*  --   --  1.1  APTT  --  121* 73*  --     BMP: Recent Labs    08/23/22 0317 08/25/22 0250 08/27/22 0433 08/28/22 0746  NA 135 137 137 135  K 4.6 4.5 4.4 4.4  CL 101 103 102 100  CO2 '26 27 29 27  '$ GLUCOSE 164* 88 84 101*  BUN 32* 25* 24* 23  CALCIUM 9.5 9.9 9.9 9.7  CREATININE 1.00 1.07 1.33* 1.13  GFRNONAA >60 >60 54* >60    LIVER FUNCTION TESTS: Recent Labs    08/02/22 0703 08/03/22 0819 08/10/22 0747 08/27/22 0433  BILITOT 1.0 1.0 0.7 0.5  AST '19 18 17 19  '$ ALT '20 17 16 29  '$ ALKPHOS 72 64 73 72  PROT 5.5* 5.0* 5.4* 6.3*  ALBUMIN 2.3* 2.0* 1.8* 2.1*    Assessment and Plan:  Right hepatic abscess s/p drain placement 8/15 Drain exchange and upsize to 12 Fr on 8/24   Patient with pending SNF discharge as soon as a bed is available, likely early next week.   Drain Location: RUQ Size: Fr size: 12 Fr Date of placement: 08/11/22. Upsized 08/20/22 Currently to: Drain collection device: suction bulb 24 hour output:  Output by Drain (mL) 09/02/22 0700 - 09/02/22 1459 09/02/22 1500 - 09/02/22 2259 09/02/22 2300 - 09/03/22 0659 09/03/22 0700 - 09/03/22 1459 09/03/22 1500 - 09/03/22 2259 09/03/22 2300 - 09/04/22 0659 09/04/22 0700 - 09/04/22 1459 09/04/22 1500 - 09/04/22 1531  Closed System Drain 1 Lateral;Right Abdomen 12 Fr.   30  10 320      Interval imaging/drain manipulation:  8/23:    CT AP  Interval decrease in loculated perihepatic fluid collection after placement of percutaneous drain.  8/24:    exchange and upsize to 12 Fr  9/4:      CT AP   Interval decrease in the subcapsular collection. Stable position of the catheter.   Current examination: Flushes/aspirates easily.  Insertion site unremarkable. Suture and stat lock in  place. Dressed appropriately.   Plan: Continue TID flushes with 5 cc NS. Record output Q shift. Dressing changes QD or PRN if soiled.  Call IR APP or on call IR MD if difficulty flushing or sudden change in drain output.  Repeat imaging/possible drain injection once output < 10 mL/QD (excluding flush material). Consideration for drain removal if output is < 10 mL/QD (excluding flush material), pending discussion with the providing surgical service.  Discharge planning: Please contact IR APP or on call IR MD prior to patient  d/c to ensure appropriate follow up plans are in place. Typically patient will follow up with IR clinic 10-14 days post d/c for repeat imaging/possible drain injection. IR scheduler will contact patient with date/time of appointment. Patient will need to flush drain QD with 5 cc NS, record output QD, dressing changes every 2-3 days or earlier if soiled.   IR will continue to follow - please call with questions or concerns.  Electronically Signed: Soyla Dryer, AGACNP-BC [phone] 09/04/2022, 2:58 PM   I spent a total of 15 Minutes at the the patient's bedside AND on the patient's hospital floor or unit, greater than 50% of which was counseling/coordinating care for perihepatic drain.

## 2022-09-05 DIAGNOSIS — K858 Other acute pancreatitis without necrosis or infection: Secondary | ICD-10-CM | POA: Diagnosis not present

## 2022-09-05 LAB — GLUCOSE, CAPILLARY
Glucose-Capillary: 121 mg/dL — ABNORMAL HIGH (ref 70–99)
Glucose-Capillary: 127 mg/dL — ABNORMAL HIGH (ref 70–99)
Glucose-Capillary: 161 mg/dL — ABNORMAL HIGH (ref 70–99)
Glucose-Capillary: 146 mg/dL — ABNORMAL HIGH (ref 70–99)
Glucose-Capillary: 129 mg/dL — ABNORMAL HIGH (ref 70–99)
Glucose-Capillary: 97 mg/dL (ref 70–99)

## 2022-09-05 NOTE — Progress Notes (Signed)
PROGRESS NOTE        PATIENT DETAILS Name: Douglas Hurst. Age: 81 y.o. Sex: male Date of Birth: 1941-07-15 Admit Date: 07/30/2022 Admitting Physician Clance Boll, MD LKG:[id number], [name], MD  Brief Summary: Patient is a 81 y.o.  male with history of PAF on Eliquis, HTN, HLD, COPD, chronic HFrEF (recovered EF)-with recent history of cholecystitis-s/p cholecystectomy but with retained CBD stone.  ERCP here was unsuccessful.  Patient was referred to [facility] underwent advanced endoscopy via transduodenal approach for ERCP on 8/3 at [facility] with Dr. Okey Dupre -he subsequently presented to the ED with severe abdominal pain-was found to have pancreatitis and admitted to the hospitalist service.  Course was complicated by A-fib RVR, pseudocyst formation, subhepatic fluid collection requiring CT-guided drain placement.    Significant events: 8/3>> admit to [facility]-abdominal pain after ERCP at [facility]-found to have acute pancreatitis 8/5>> A-fib RVR-no response to IV Lopressor-blood pressure soft-amiodarone infusion started.  After discussion with GI-heparin started without bolus.  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 8/5>> CT abdomen/pelvis: Resolution of hypoenhancement of the pancreas-persistent inflammatory changes within the retroperitoneum surrounding the head of the pancreas extending into the porta hepatis.  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 8/13>> CT abdomen/pelvis: Increased size of large perihepatic pseudocysts 8/23>> CT abdomen/pelvis: Interval decrease in loculated perihepatic fluid collection after percutaneous drain placement. 9/04>> CT abdomen/pelvis: Mild decrease in  subcapsular collection, findings suggest resolving pancreatitis.  Significant microbiology data: 8/15>> perihepatic fluid culture: No growth  Procedures: 8/15>> CT-guided drain placement right subcapsular fluid collection. 8/16>>Cortak placed (removed 8/30) 8/24>> s/p exchange and size up of perihepatic drain.  Consults: GI Phone consult-cards-Dr Crenshaw IR Palliative care  Subjective:  Patient in bed, appears comfortable, denies any headache, no fever, no chest pain or pressure, no shortness of breath , no abdominal pain. No new focal weakness.   Objective: Vitals: Blood pressure 136/61, pulse 72, temperature 97.9 F (36.6 C), temperature source Oral, resp. rate 19, height '6\' 1"'$  (1.854 m), weight 78.3 kg, SpO2 94 %.   Exam:  Awake Alert, No new F.N deficits, Normal affect [place].AT,PERRAL Supple Neck, No JVD,   Symmetrical Chest wall movement, Good air movement bilaterally, CTAB RRR,No Gallops, Rubs or new Murmurs,  +ve B.Sounds, Abd Soft, No tenderness,   No Cyanosis, Clubbing or edema     Assessment/Plan:  Acute post ERCP pancreatitis with pseudocyst formation: Overall improved-NG tube removed 8/30-tolerating advancement in diet.    Subhepatic space fluid collection: Likely due to leak from ERCP done via trans duodenal approach.  Drain in place-IR following.  Repeat CT on 9/4 continues to show subhepatic collection-drain remains in place.  IR continues to follow-plans to discharge with drain-IR will arrange for outpatient follow-up.  Continue 3 times daily flushes.  Post discharged follow-up with IR in 10 to 14 days.  Drain care. Patient will need to flush drain QD with 5 cc NS, record output QD, dressing changes every 2-3 days or earlier if soiled.   PAF with RVR: Due to acute illness-maintaining sinus rhythm-on amiodarone-and SQ Lovenox.   Suspect we can now switch to a DOAC.  Acute hypoxic respiratory failure: Due to pleural effusion/atelectasis-improved-on room air.  Continue mobilization.  Encourage incentive spirometry.  AKI: Resolved-likely hemodynamically mediated.  Acute on chronic HFrEF with recovered EF: Volume status stable-no longer on furosemide.  Orthostatic hypotension: Noted on 9/1 with PT-gently hydrated.    Normocytic anemia: Due to critical/acute illness-no evidence of blood loss.  Follow CBC periodically.  DM-2 (A1c 5.6 on 7/7): CBGs remain on the lower side-decrease Lantus to daily dosing-resume metformin on discharge.  Recent Labs    09/04/22 2328 09/05/22 0325 09/05/22 0836  GLUCAP 132* 121* 127*    HTN: BP stable-continue metoprolol.  BPH: Continue Flomax  COPD: Continue bronchodilators  Infrarenal abdominal aortic aneurysm 4 mm: Unchanged from prior study-annual follow-up is required.  Debility/deconditioning: Await SNF bed .  Nutrition Status: Nutrition Problem: Severe Malnutrition Etiology: chronic illness (COPD, HF, acute pancreatitis) Signs/Symptoms: severe fat depletion, moderate fat depletion, severe muscle depletion, moderate muscle depletion Interventions: Ensure Enlive (each supplement provides 350kcal and 20 grams of protein), MVI    BMI: Estimated body mass index is 22.77 kg/m as calculated from the following:   Height as of this encounter: '6\' 1"'$  (1.854 m).   Weight as of this encounter: 78.3 kg.   Code status:   Code Status: Full Code   DVT Prophylaxis:IV heparinapixaban (ELIQUIS) tablet 5 mg     Family Communication: Spouse at bedside on 09/03/22 by previous MD   Disposition Plan: Status is: Inpatient Remains inpatient appropriate because: Severe pancreatitis-subhepatic collection-drain in place-repeat CT planned for 9/4-SNF plan-awaiting insurance authorization  Planned Discharge Destination:SNF when bed is available   Diet: Diet Order             DIET DYS 3 Room service appropriate? Yes; Fluid consistency: Thin  Diet effective now                   MEDICATIONS: Scheduled  Meds:  acetaminophen  1,000 mg Oral TID   amiodarone  200 mg Oral Daily   apixaban  5 mg Oral BID   feeding supplement  237 mL Oral BID BM   fentaNYL  1 patch Transdermal Q72H   insulin aspart  0-9 Units Subcutaneous Q4H   insulin detemir  8 Units Subcutaneous QHS   metoprolol tartrate  25 mg Oral BID   multivitamin with minerals  1 tablet Oral Daily   polyethylene glycol  17 g Oral BID   senna-docusate  1 tablet Oral Daily   sodium chloride flush  5 mL Intracatheter Q8H   tamsulosin  0.8 mg Oral Daily   Continuous Infusions:   PRN Meds:.guaiFENesin-dextromethorphan, ondansetron **OR** ondansetron (ZOFRAN) IV, oxyCODONE, umeclidinium bromide   I have personally reviewed following labs and imaging studies  LABORATORY DATA:  Recent Labs  Lab 08/30/22 0354  WBC 6.8  HGB 10.6*  HCT 31.5*  PLT 276  MCV 89.5  MCH 30.1  MCHC 33.7  RDW 16.3*    No results for input(s): "NA", "K", "CL", "CO2", "GLUCOSE", "BUN", "CREATININE", "CALCIUM", "AST", "ALT", "ALKPHOS", "BILITOT", "ALBUMIN", "MG", "PHOS", "CRP", "DDIMER", "PROCALCITON", "LATICACIDVEN", "INR", "TSH", "CORTISOL", "HGBA1C", "AMMONIA", "BNP" in the last 168 hours.  Invalid input(s): "GFRCGP", "ARSCOV2NAA"         LOS: 36 days   Signature  Lala Lund M.D on 09/05/2022 at 11:19 AM   -  To page go to [email or web address]

## 2022-09-06 DIAGNOSIS — K858 Other acute pancreatitis without necrosis or infection: Secondary | ICD-10-CM | POA: Diagnosis not present

## 2022-09-06 LAB — MAGNESIUM: Magnesium: 1.8 mg/dL (ref 1.7–2.4)

## 2022-09-06 LAB — BASIC METABOLIC PANEL
Anion gap: 7 (ref 5–15)
BUN: 25 mg/dL — ABNORMAL HIGH (ref 8–23)
CO2: 27 mmol/L (ref 22–32)
Calcium: 9.1 mg/dL (ref 8.9–10.3)
Chloride: 101 mmol/L (ref 98–111)
Creatinine, Ser: 1.06 mg/dL (ref 0.61–1.24)
GFR, Estimated: >60 mL/min (ref >60)
Glucose, Bld: 97 mg/dL (ref 70–99)
Potassium: 4.2 mmol/L (ref 3.5–5.1)
Sodium: 135 mmol/L (ref 135–145)

## 2022-09-06 LAB — CBC
HCT: 33.8 % — ABNORMAL LOW (ref 39.0–52.0)
Hemoglobin: 11.1 g/dL — ABNORMAL LOW (ref 13.0–17.0)
MCH: 29.7 pg (ref 26.0–34.0)
MCHC: 32.8 g/dL (ref 30.0–36.0)
MCV: 90.4 fL (ref 80.0–100.0)
Platelets: 251 10*3/uL (ref 150–400)
RBC: 3.74 MIL/uL — ABNORMAL LOW (ref 4.22–5.81)
RDW: 16.1 % — ABNORMAL HIGH (ref 11.5–15.5)
WBC: 7 10*3/uL (ref 4.0–10.5)
nRBC: 0 % (ref 0.0–0.2)

## 2022-09-06 LAB — GLUCOSE, CAPILLARY
Glucose-Capillary: 100 mg/dL — ABNORMAL HIGH (ref 70–99)
Glucose-Capillary: 104 mg/dL — ABNORMAL HIGH (ref 70–99)
Glucose-Capillary: 110 mg/dL — ABNORMAL HIGH (ref 70–99)
Glucose-Capillary: 113 mg/dL — ABNORMAL HIGH (ref 70–99)
Glucose-Capillary: 154 mg/dL — ABNORMAL HIGH (ref 70–99)
Glucose-Capillary: 93 mg/dL (ref 70–99)

## 2022-09-06 NOTE — Plan of Care (Signed)

## 2022-09-06 NOTE — Progress Notes (Signed)
PROGRESS NOTE        PATIENT DETAILS Name: Douglas Hurst. Age: 81 y.o. Sex: male Date of Birth: October 12, 1941 Admit Date: 07/30/2022 Admitting Physician Clance Boll, MD RKY:[id number], [name], MD  Brief Summary: Patient is a 81 y.o.  male with history of PAF on Eliquis, HTN, HLD, COPD, chronic HFrEF (recovered EF)-with recent history of cholecystitis-s/p cholecystectomy but with retained CBD stone.  ERCP here was unsuccessful.  Patient was referred to [facility] underwent advanced endoscopy via transduodenal approach for ERCP on 8/3 at [facility] with Dr. Okey Dupre -he subsequently presented to the ED with severe abdominal pain-was found to have pancreatitis and admitted to the hospitalist service.  Course was complicated by A-fib RVR, pseudocyst formation, subhepatic fluid collection requiring CT-guided drain placement.    Significant events: 8/3>> admit to [facility]-abdominal pain after ERCP at [facility]-found to have acute pancreatitis 8/5>> A-fib RVR-no response to IV Lopressor-blood pressure soft-amiodarone infusion started.  After discussion with GI-heparin started without bolus.  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 8/5>> CT abdomen/pelvis: Resolution of hypoenhancement of the pancreas-persistent inflammatory changes within the retroperitoneum surrounding the head of the pancreas extending into the porta hepatis.  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 8/13>> CT abdomen/pelvis: Increased size of large perihepatic pseudocysts 8/23>> CT abdomen/pelvis: Interval decrease in loculated perihepatic fluid collection after percutaneous drain placement. 9/04>> CT abdomen/pelvis: Mild decrease in  subcapsular collection, findings suggest resolving pancreatitis.  Significant microbiology data: 8/15>> perihepatic fluid culture: No growth  Procedures: 8/15>> CT-guided drain placement right subcapsular fluid collection. 8/16>>Cortak placed (removed 8/30) 8/24>> s/p exchange and size up of perihepatic drain.  Consults: GI Phone consult-cards-Dr Crenshaw IR Palliative care  Subjective: Patient in bed, appears comfortable, denies any headache, no fever, no chest pain or pressure, no shortness of breath , no abdominal pain. No new focal weakness.  Objective: Vitals: Blood pressure 125/63, pulse 68, temperature 97.8 F (36.6 C), temperature source Axillary, resp. rate 16, height '6\' 1"'$  (1.854 m), weight 70.3 kg, SpO2 99 %.   Exam:  Awake Alert, No new F.N deficits, Normal affect [place].AT,PERRAL Supple Neck, No JVD,   Symmetrical Chest wall movement, Good air movement bilaterally, CTAB RRR,No Gallops, Rubs or new Murmurs,  +ve B.Sounds, Abd Soft, No tenderness, cholecystostomy drain in place No Cyanosis, Clubbing or edema    Assessment/Plan:  Acute post ERCP pancreatitis with pseudocyst formation: Overall improved-NG tube removed 8/30-tolerating advancement in diet.    Subhepatic space fluid collection: Likely due to leak from ERCP done via trans duodenal approach.  Drain in place-IR following.  Repeat CT on 9/4 continues to show subhepatic collection-drain remains in place.  IR continues to follow-plans to discharge with drain-IR will arrange for outpatient follow-up.  Continue 3 times daily flushes.  Post discharged follow-up with IR in 10 to 14 days.  Drain care. Patient will need to flush drain QD with 5 cc NS, record output QD, dressing changes every 2-3 days or earlier if soiled.   PAF with RVR: Due to acute illness-maintaining sinus rhythm-on amiodarone-and Eliquis.  Acute hypoxic respiratory failure: Due to pleural effusion/atelectasis-improved-on room air.  Continue  mobilization.  Encourage incentive spirometry.  AKI: Resolved-likely hemodynamically mediated.  Acute on chronic HFrEF with recovered EF: Volume status stable-no longer on furosemide.  Orthostatic hypotension: Noted on 9/1 with PT-gently hydrated.    Normocytic anemia: Due to critical/acute illness-no evidence of blood loss.  Follow CBC periodically.  DM-2 (A1c 5.6 on 7/7): CBGs remain on the lower side-decrease Lantus to daily dosing-resume metformin on discharge.  Recent Labs    09/05/22 2329 09/06/22 0358 09/06/22 0829  GLUCAP 97 104* 93    HTN: BP stable-continue metoprolol.  BPH: Continue Flomax  COPD: Continue bronchodilators  Infrarenal abdominal aortic aneurysm 4 mm: Unchanged from prior study-annual follow-up is required.  Debility/deconditioning: Await SNF bed .  Nutrition Status: Nutrition Problem: Severe Malnutrition Etiology: chronic illness (COPD, HF, acute pancreatitis) Signs/Symptoms: severe fat depletion, moderate fat depletion, severe muscle depletion, moderate muscle depletion Interventions: Ensure Enlive (each supplement provides 350kcal and 20 grams of protein), MVI    BMI: Estimated body mass index is 20.45 kg/m as calculated from the following:   Height as of this encounter: '6\' 1"'$  (1.854 m).   Weight as of this encounter: 70.3 kg.   Code status:   Code Status: Full Code   DVT Prophylaxis:IV heparinapixaban (ELIQUIS) tablet 5 mg     Family Communication: Spouse at bedside on 09/03/22 by previous MD   Disposition Plan: Status is: Inpatient Remains inpatient appropriate because: Severe pancreatitis-subhepatic collection-drain in place-repeat CT planned for 9/4-SNF plan-awaiting insurance authorization  Planned Discharge Destination:SNF when bed is available   Diet: Diet Order             DIET DYS 3 Room service appropriate? Yes; Fluid consistency: Thin  Diet effective now                   MEDICATIONS: Scheduled Meds:   acetaminophen  1,000 mg Oral TID   amiodarone  200 mg Oral Daily   apixaban  5 mg Oral BID   feeding supplement  237 mL Oral BID BM   fentaNYL  1 patch Transdermal Q72H   insulin aspart  0-9 Units Subcutaneous Q4H   insulin detemir  8 Units Subcutaneous QHS   metoprolol tartrate  25 mg Oral BID   multivitamin with minerals  1 tablet Oral Daily   polyethylene glycol  17 g Oral BID   senna-docusate  1 tablet Oral Daily   sodium chloride flush  5 mL Intracatheter Q8H   tamsulosin  0.8 mg Oral Daily   Continuous Infusions:   PRN Meds:.guaiFENesin-dextromethorphan, ondansetron **OR** ondansetron (ZOFRAN) IV, oxyCODONE, umeclidinium bromide   I have personally reviewed following labs and imaging studies  LABORATORY DATA:  Recent Labs  Lab 09/06/22 0232  WBC 7.0  HGB 11.1*  HCT 33.8*  PLT 251  MCV 90.4  MCH 29.7  MCHC 32.8  RDW 16.1*    Recent Labs  Lab 09/06/22 0232  NA 135  K 4.2  CL 101  CO2 27  GLUCOSE 97  BUN 25*  CREATININE 1.06  CALCIUM 9.1  MG 1.8    LOS: 37 days   Signature  Lala Lund M.D on 09/06/2022 at 10:06 AM   -  To page go to [email or web address]

## 2022-09-07 DIAGNOSIS — K858 Other acute pancreatitis without necrosis or infection: Secondary | ICD-10-CM | POA: Diagnosis not present

## 2022-09-07 LAB — GLUCOSE, CAPILLARY
Glucose-Capillary: 100 mg/dL — ABNORMAL HIGH (ref 70–99)
Glucose-Capillary: 104 mg/dL — ABNORMAL HIGH (ref 70–99)
Glucose-Capillary: 148 mg/dL — ABNORMAL HIGH (ref 70–99)
Glucose-Capillary: 149 mg/dL — ABNORMAL HIGH (ref 70–99)
Glucose-Capillary: 164 mg/dL — ABNORMAL HIGH (ref 70–99)
Glucose-Capillary: 99 mg/dL (ref 70–99)

## 2022-09-07 NOTE — NC FL2 (Signed)
[place] LEVEL OF CARE SCREENING TOOL     IDENTIFICATION  Patient Name: Douglas Hurst. Birthdate: 1941-01-26 Sex: male Admission Date (Current Location): 07/30/2022  [facility] and [place] Number:  Herbalist and Address:  [facility] [place]. [facility], [place] [address] [place], [place] [address]      Provider Number: [id number]  Attending Physician Name and Address:  Thurnell Lose, MD  Relative Name and Phone Number:       Current Level of Care: Hospital Recommended Level of Care: [place] Prior Approval Number:    Date Approved/Denied:   PASRR Number: [id number]  Discharge Plan: SNF    Current Diagnoses: Patient Active Problem List   Diagnosis Date Noted   Protein-calorie malnutrition, severe 08/12/2022   Acute pancreatitis 07/31/2022   Choledocholithiasis 07/08/2022   Hyperbilirubinemia 04/30/2022   Hypoxia 04/29/2022   Normocytic anemia 84/66/5993   Acute systolic heart failure (HCC)    PAF (paroxysmal atrial fibrillation) ([place])    Acute calculous cholecystitis 04/26/2022   Abdominal aortic aneurysm ([place]) 04/25/2022   Bipolar disorder ([place]) 04/25/2022   Chronic obstructive pulmonary disease ([place]) 04/25/2022   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[place]) 04/25/2022   Gastroesophageal reflux disease 04/25/2022   Essential hypertension 04/25/2022   Mixed hyperlipidemia 04/25/2022   sepsis secondary to calculus of bile duct (any) with acute cholecystitis 04/25/2022   Sepsis ([place]) 04/25/2022    Orientation RESPIRATION BLADDER Height & Weight     Self, Time, Situation, Place  Normal Incontinent, External catheter Weight: 173 lb 15.1 oz (78.9 kg) Height:  '6\' 1"'$  (185.4 cm)  BEHAVIORAL SYMPTOMS/MOOD NEUROLOGICAL BOWEL NUTRITION STATUS      Incontinent Diet (See dc summary)  AMBULATORY STATUS COMMUNICATION OF NEEDS Skin   Limited Assist Verbally PU Stage and Appropriate Care,  Surgical wounds (Stage II on buttocks; closed incision on abdomen; puncture on abdomen)                       Personal Care Assistance Level of Assistance  Bathing, Feeding, Dressing Bathing Assistance: Limited assistance Feeding assistance: Independent Dressing Assistance: Limited assistance     Functional Limitations Info  Sight, Hearing Sight Info: Impaired Hearing Info: Impaired      SPECIAL CARE FACTORS FREQUENCY  PT (By licensed PT), OT (By licensed OT)     PT Frequency: 5x/week OT Frequency: 5x/week            Contractures Contractures Info: Not present    Additional Factors Info  Code Status, Allergies, Insulin Sliding Scale Code Status Info: Full Allergies Info: NKA   Insulin Sliding Scale Info: See dc summary       Current Medications (09/07/2022):  This is the current hospital active medication list Current Facility-Administered Medications  Medication Dose Route Frequency Provider Last Rate Last Admin   acetaminophen (TYLENOL) tablet 1,000 mg  1,000 mg Oral TID Philis Pique, NP   1,000 mg at 09/07/22 1741   amiodarone (PACERONE) tablet 200 mg  200 mg Oral Daily Little Ishikawa, MD   200 mg at 09/07/22 0850   apixaban (ELIQUIS) tablet 5 mg  5 mg Oral BID Jonetta Osgood, MD   5 mg at 09/07/22 0850   feeding supplement (ENSURE ENLIVE / ENSURE PLUS) liquid 237 mL  237 mL Oral BID BM Charlynne Cousins, MD   237 mL at 09/07/22 1334   fentaNYL (DURAGESIC) 12 MCG/HR  1 patch  1 patch Transdermal Q72H Patrecia Pour, MD   1 patch at 09/06/22 1638   guaiFENesin-dextromethorphan (ROBITUSSIN DM) 100-10 MG/5ML syrup 5 mL  5 mL Oral Q4H PRN Jonetta Osgood, MD   5 mL at 08/29/22 1540   insulin aspart (novoLOG) injection 0-9 Units  0-9 Units Subcutaneous Q4H Patrecia Pour, MD   1 Units at 09/07/22 1741   insulin detemir (LEVEMIR) injection 8 Units  8 Units Subcutaneous QHS Jonetta Osgood, MD   8 Units at 09/06/22 2106   metoprolol tartrate  (LOPRESSOR) tablet 25 mg  25 mg Oral BID Patrecia Pour, MD   25 mg at 09/07/22 0850   multivitamin with minerals tablet 1 tablet  1 tablet Oral Daily Jonetta Osgood, MD   1 tablet at 09/07/22 0850   ondansetron (ZOFRAN) tablet 4 mg  4 mg Oral Q6H PRN Clance Boll, MD       Or   ondansetron [facility]) injection 4 mg  4 mg Intravenous Q6H PRN Clance Boll, MD       oxyCODONE (Oxy IR/ROXICODONE) immediate release tablet 10 mg  10 mg Oral Q6H PRN Jonetta Osgood, MD   10 mg at 09/06/22 0432   polyethylene glycol (MIRALAX / GLYCOLAX) packet 17 g  17 g Oral BID Charlynne Cousins, MD   17 g at 09/06/22 0809   senna-docusate (Senokot-S) tablet 1 tablet  1 tablet Oral Daily Patrecia Pour, MD   1 tablet at 09/06/22 0808   sodium chloride flush (NS) 0.9 % injection 5 mL  5 mL Intracatheter Q8H Corrie Mckusick, DO   5 mL at 09/07/22 1334   tamsulosin (FLOMAX) capsule 0.8 mg  0.8 mg Oral Daily Little Ishikawa, MD   0.8 mg at 09/07/22 0850   umeclidinium bromide (INCRUSE ELLIPTA) 62.5 MCG/ACT 1 puff  1 puff Inhalation Daily PRN Theotis Burrow, [facility]         Discharge Medications: Please see discharge summary for a list of discharge medications.  Relevant Imaging Results:  Relevant Lab Results:   Additional Information SSN: [id number]  [place], LCSW

## 2022-09-07 NOTE — TOC Progression Note (Addendum)
Transition of Care (TOC) - Progression Note    Patient Details  Name: Douglas Hurst. MRN: [id number] Date of Birth: 23-Oct-1941  Transition of Care [facility] CM/SW [place], LCSW Phone Number: 09/07/2022, 8:50 AM  Clinical Narrative:    8:50am-Pasrr still pending.  CSW received call from [employer] with [employer]. He will conduct screening with patient.  5:53pm-Pasrr received and placed on FL2. Spouse will complete paperwork at [facility] tomorrow at 9am. She is requesting PTAR for transport.    Expected Discharge Plan: [place] Barriers to Discharge: Continued Medical Work up  Expected Discharge Plan and Services Expected Discharge Plan: [place] In-house Referral: Clinical Social Work   Post Acute Care Choice: [place] Living arrangements for the past 2 months: Single Family Home                                       Social Determinants of Health (SDOH) Interventions    Readmission Risk Interventions     No data to display

## 2022-09-07 NOTE — Progress Notes (Signed)
PROGRESS NOTE        PATIENT DETAILS Name: Douglas Hurst. Age: 81 y.o. Sex: male Date of Birth: Jan 14, 1941 Admit Date: 07/30/2022 Admitting Physician Clance Boll, MD BPZ:WCHENID, [name], MD  Brief Summary: Patient is a 81 y.o.  male with history of PAF on Eliquis, HTN, HLD, COPD, chronic HFrEF (recovered EF)-with recent history of cholecystitis-s/p cholecystectomy but with retained CBD stone.  ERCP here was unsuccessful.  Patient was referred to [facility] underwent advanced endoscopy via transduodenal approach for ERCP on 8/3 at [facility] with Dr. Okey Dupre -he subsequently presented to the ED with severe abdominal pain-was found to have pancreatitis and admitted to the hospitalist service.  Course was complicated by A-fib RVR, pseudocyst formation, subhepatic fluid collection requiring CT-guided drain placement.    Significant events: 8/3>> admit to [facility]-abdominal pain after ERCP at [facility]-found to have acute pancreatitis 8/5>> A-fib RVR-no response to IV Lopressor-blood pressure soft-amiodarone infusion started.  After discussion with GI-heparin started without bolus.  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 8/5>> CT abdomen/pelvis: Resolution of hypoenhancement of the pancreas-persistent inflammatory changes within the retroperitoneum surrounding the head of the pancreas extending into the porta hepatis.  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 8/13>> CT abdomen/pelvis: Increased size of large perihepatic pseudocysts 8/23>> CT abdomen/pelvis: Interval decrease in loculated perihepatic fluid collection after percutaneous drain placement. 9/04>> CT abdomen/pelvis: Mild decrease in  subcapsular collection, findings suggest resolving pancreatitis.  Significant microbiology data: 8/15>> perihepatic fluid culture: No growth  Procedures: 8/15>> CT-guided drain placement right subcapsular fluid collection. 8/16>>Cortak placed (removed 8/30) 8/24>> s/p exchange and size up of perihepatic drain.  Consults: GI Phone consult-cards-Dr Crenshaw IR Palliative care  Subjective: Patient in bed, appears comfortable, denies any headache, no fever, no chest pain or pressure, no shortness of breath , no abdominal pain. No new focal weakness.   Objective: Vitals: Blood pressure 119/62, pulse 80, temperature 97.7 F (36.5 C), temperature source Oral, resp. rate 16, height '6\' 1"'$  (1.854 m), weight 78.9 kg, SpO2 100 %.   Exam:  Awake Alert, No new F.N deficits, Normal affect [place].AT,PERRAL Supple Neck, No JVD,   Symmetrical Chest wall movement, Good air movement bilaterally, CTAB RRR,No Gallops, Rubs or new Murmurs,  +ve B.Sounds, Abd Soft, No tenderness, cholecystostomy drain in place No Cyanosis, Clubbing or edema    Assessment/Plan:  Acute post ERCP pancreatitis with pseudocyst formation: Overall improved-NG tube removed 8/30-tolerating advancement in diet.    Subhepatic space fluid collection: Likely due to leak from ERCP done via trans duodenal approach.  Drain in place-IR following.  Repeat CT on 9/4 continues to show subhepatic collection-drain remains in place.  IR continues to follow-plans to discharge with drain-IR will arrange for outpatient follow-up.  Continue 3 times daily flushes.  Post discharged follow-up with IR in 10 to 14 days.  Medically stable await SNF bed.  Drain care. Patient will need to flush drain QD with 5 cc NS, record output QD, dressing changes every 2-3 days or earlier if soiled.   PAF with RVR: Due to acute illness-maintaining sinus rhythm-on amiodarone-and Eliquis.  Acute hypoxic respiratory failure: Due to pleural  effusion/atelectasis-improved-on room air.  Continue mobilization.  Encourage incentive  spirometry.  AKI: Resolved-likely hemodynamically mediated.  Acute on chronic HFrEF with recovered EF: Volume status stable-no longer on furosemide.  Orthostatic hypotension: Noted on 9/1 with PT-gently hydrated.    Normocytic anemia: Due to critical/acute illness-no evidence of blood loss.  Follow CBC periodically.  DM-2 (A1c 5.6 on 7/7): CBGs remain on the lower side-decrease Lantus to daily dosing-resume metformin on discharge.  Recent Labs    09/06/22 2347 09/07/22 0409 09/07/22 0756  GLUCAP 100* 100* 104*    HTN: BP stable-continue metoprolol.  BPH: Continue Flomax  COPD: Continue bronchodilators  Infrarenal abdominal aortic aneurysm 4 mm: Unchanged from prior study-annual follow-up is required.  Debility/deconditioning: Await SNF bed .  Nutrition Status: Nutrition Problem: Severe Malnutrition Etiology: chronic illness (COPD, HF, acute pancreatitis) Signs/Symptoms: severe fat depletion, moderate fat depletion, severe muscle depletion, moderate muscle depletion Interventions: Ensure Enlive (each supplement provides 350kcal and 20 grams of protein), MVI    BMI: Estimated body mass index is 22.95 kg/m as calculated from the following:   Height as of this encounter: '6\' 1"'$  (1.854 m).   Weight as of this encounter: 78.9 kg.   Code status:   Code Status: Full Code   DVT Prophylaxis:IV heparinapixaban (ELIQUIS) tablet 5 mg     Family Communication: Spouse at bedside on 09/03/22 by previous MD   Disposition Plan: Status is: Inpatient Remains inpatient appropriate because: Severe pancreatitis-subhepatic collection-drain in place-repeat CT planned for 9/4-SNF plan-awaiting insurance authorization  Planned Discharge Destination:SNF when bed is available   Diet: Diet Order             DIET DYS 3 Room service appropriate? Yes; Fluid consistency: Thin  Diet effective now                    MEDICATIONS: Scheduled Meds:  acetaminophen  1,000 mg Oral TID   amiodarone  200 mg Oral Daily   apixaban  5 mg Oral BID   feeding supplement  237 mL Oral BID BM   fentaNYL  1 patch Transdermal Q72H   insulin aspart  0-9 Units Subcutaneous Q4H   insulin detemir  8 Units Subcutaneous QHS   metoprolol tartrate  25 mg Oral BID   multivitamin with minerals  1 tablet Oral Daily   polyethylene glycol  17 g Oral BID   senna-docusate  1 tablet Oral Daily   sodium chloride flush  5 mL Intracatheter Q8H   tamsulosin  0.8 mg Oral Daily   Continuous Infusions:   PRN Meds:.guaiFENesin-dextromethorphan, ondansetron **OR** ondansetron (ZOFRAN) IV, oxyCODONE, umeclidinium bromide   I have personally reviewed following labs and imaging studies  LABORATORY DATA:  Recent Labs  Lab 09/06/22 0232  WBC 7.0  HGB 11.1*  HCT 33.8*  PLT 251  MCV 90.4  MCH 29.7  MCHC 32.8  RDW 16.1*    Recent Labs  Lab 09/06/22 0232  NA 135  K 4.2  CL 101  CO2 27  GLUCOSE 97  BUN 25*  CREATININE 1.06  CALCIUM 9.1  MG 1.8    LOS: 38 days   Signature  Lala Lund M.D on 09/07/2022 at 9:02 AM   -  To page go to [email or web address]

## 2022-09-07 NOTE — Progress Notes (Signed)
Physical Therapy Treatment Patient Details Name: Douglas Hurst. MRN: [id number] DOB: August 28, 1941 Today's Date: 09/07/2022   History of Present Illness Patient is a 81 yo male presenting to the ED with severe abdominal pain with radiation to right shoulder on 07/31/22. Patient had underwent ECRP for removal of stones from biliary and pancreatic duct on 8/3. Admitted with acute pancreatitis. Patient with cortrak and Perihepatic drain placement. PMH includes: bipolar d/o DMII, HTN, HLD, GERD, COPD ,  Afib ,diastolic heart failure, AAA with interim history of acute cholecystitis.    PT Comments    Pt received in supine, agreeable to therapy session with encouragement, with good participation and fair tolerance for gait training and good tolerance for supine/seated LE exercises. Pt needing up to minA +2 for safety with bed mobility, transfers and gait with close chair follow and RW. Pt limited due to fatigue and soft BP limiting standing tolerance. BP MAP (75) supine, then (65) seated, improved after seated exercises then (70) once up in recliner with legs extended to rest. VSS otherwise overall. Pt also performed supine and seated UE/LE exercises as detailed below with good tolerance but needing tactile cues for safer technique/form. Pt continues to benefit from PT services to progress toward functional mobility goals.   Recommendations for follow up therapy are one component of a multi-disciplinary discharge planning process, led by the attending physician.  Recommendations may be updated based on patient status, additional functional criteria and insurance authorization.  Follow Up Recommendations  Skilled nursing-short term rehab (<3 hours/day) Can patient physically be transported by private vehicle: Yes   Assistance Recommended at Discharge Frequent or constant Supervision/Assistance  Patient can return home with the following A lot of help with walking and/or transfers;A lot of help with  bathing/dressing/bathroom;Assistance with cooking/housework;Direct supervision/assist for medications management;Direct supervision/assist for financial management;Assist for transportation;Help with stairs or ramp for entrance   Equipment Recommendations  None recommended by PT (TBD post-acute)    Recommendations for Other Services       Precautions / Restrictions Precautions Precautions: Fall Precaution Comments: RUQ drain, orthostatic hypotension Restrictions Weight Bearing Restrictions: No     Mobility  Bed Mobility Overal bed mobility: Needs Assistance Bed Mobility: Rolling, Sidelying to Sit Rolling: Min guard Sidelying to sit: Min assist       General bed mobility comments: cues for log roll technique and trunk assist via HHA per pt request    Transfers Overall transfer level: Needs assistance Equipment used: Rolling walker (2 wheels) Transfers: Sit to/from Stand, Bed to chair/wheelchair/BSC Sit to Stand: Min assist, +2 safety/equipment   Step pivot transfers: Min assist, +2 safety/equipment       General transfer comment: cues for safe UE placement, pt needing tactile cues due to poor carryover of verbal cues; EOB>RW and RW>chair    Ambulation/Gait Ambulation/Gait assistance: Min assist, +2 safety/equipment Gait Distance (Feet): 7 Feet Assistive device: Rolling walker (2 wheels) Gait Pattern/deviations: Narrow base of support, Shuffle, Decreased stride length, Step-to pattern Gait velocity: decr     General Gait Details: heavy reliance on RW, pt quick to fatigue and noted soft BP upon sitting. Pt reports 8-9/10 RPE once seated and defers another trial, lunch arriving to room as well.    Balance Overall balance assessment: Mild deficits observed, not formally tested   Sitting balance-Leahy Scale:  (Good static and Fair dynamic seated balance) Sitting balance - Comments: able to perform seated BUE and LE exercises without LOB   Standing balance support:  Bilateral upper  extremity supported Standing balance-Leahy Scale: Poor Standing balance comment: heavy reliance on RW and external support needed                            Cognition Arousal/Alertness: Awake/alert Behavior During Therapy: Flat affect Overall Cognitive Status: Impaired/Different from baseline Area of Impairment: Safety/judgement, Awareness                               General Comments: required VC and tactile cues each time prior to standing for hand placement.        Exercises Other Exercises Other Exercises: seated BLE AROM: LAQ, ankle pumps x10-15 reps ea Other Exercises: seated BUE AROM: arm "bicycle" for B shoulder and elbow flexion/extension x15 reps ea Other Exercises: supine BLE A/AAROM: SLR (AA), hip abd, heel slides, ankle pumps x10 reps ea    General Comments General comments (skin integrity, edema, etc.): BP 108/62 (75) supine HR 74; BP 97/50 (64) HR 81 seated EOB; BP 105/53 (70) reclined in chair HR 76; SpO2 100% on RA at rest and with exertion (ear sensor with good signal)      Pertinent Vitals/Pain Pain Assessment Pain Assessment: Faces Faces Pain Scale: Hurts even more Pain Location: right flank/drain site Pain Descriptors / Indicators: Grimacing, Sharp, Aching Pain Intervention(s): Monitored during session, Repositioned, Limited activity within patient's tolerance           PT Goals (current goals can now be found in the care plan section) Acute Rehab PT Goals Patient Stated Goal: to get stronger and pain reduced. PT Goal Formulation: With patient/family Time For Goal Achievement: 09/07/22 Progress towards PT goals: Progressing toward goals    Frequency    Min 2X/week      PT Plan Current plan remains appropriate       AM-PAC PT "6 Clicks" Mobility   Outcome Measure  Help needed turning from your back to your side while in a flat bed without using bedrails?: A Little Help needed moving from lying on  your back to sitting on the side of a flat bed without using bedrails?: A Lot (mod sequencing and safety cues for this and all items "2" below) Help needed moving to and from a bed to a chair (including a wheelchair)?: A Lot Help needed standing up from a chair using your arms (e.g., wheelchair or bedside chair)?: A Lot Help needed to walk in hospital room?: Total Help needed climbing 3-5 steps with a railing? : Total 6 Click Score: 11    End of Session Equipment Utilized During Treatment: Gait belt;Other (comment) (TED hose donned throughout) Activity Tolerance: Treatment limited secondary to medical complications (Comment);Other (comment);Patient tolerated treatment well (standing tolerance limited due to fatigue/soft BP with initial transfers) Patient left: with call bell/phone within reach;with family/visitor present;in chair;with chair alarm set Nurse Communication: Mobility status;Other (comment) (soft BP) PT Visit Diagnosis: Muscle weakness (generalized) (M62.81);Difficulty in walking, not elsewhere classified (R26.2);Other abnormalities of gait and mobility (R26.89);Pain Pain - Right/Left: Right Pain - part of body:  (Flank)     Time: 4709-6283 PT Time Calculation (min) (ACUTE ONLY): 27 min  Charges:  $Therapeutic Exercise: 8-22 mins $Therapeutic Activity: 8-22 mins                     Deshara Rossi P., PTA Acute Rehabilitation Services Secure Chat Preferred 9a-5:30pm Office: [phone]    [name] [facility] 09/07/2022,  4:37 PM

## 2022-09-07 NOTE — Plan of Care (Signed)

## 2022-09-08 DIAGNOSIS — K858 Other acute pancreatitis without necrosis or infection: Secondary | ICD-10-CM | POA: Diagnosis not present

## 2022-09-08 DIAGNOSIS — I700 Atherosclerosis of aorta: Secondary | ICD-10-CM | POA: Diagnosis not present

## 2022-09-08 DIAGNOSIS — I10 Essential (primary) hypertension: Secondary | ICD-10-CM | POA: Diagnosis not present

## 2022-09-08 DIAGNOSIS — E559 Vitamin D deficiency, unspecified: Secondary | ICD-10-CM | POA: Diagnosis not present

## 2022-09-08 DIAGNOSIS — M2550 Pain in unspecified joint: Secondary | ICD-10-CM | POA: Diagnosis not present

## 2022-09-08 DIAGNOSIS — K859 Acute pancreatitis without necrosis or infection, unspecified: Secondary | ICD-10-CM | POA: Diagnosis not present

## 2022-09-08 DIAGNOSIS — E1142 Type 2 diabetes mellitus with diabetic polyneuropathy: Secondary | ICD-10-CM | POA: Diagnosis not present

## 2022-09-08 DIAGNOSIS — I5022 Chronic systolic (congestive) heart failure: Secondary | ICD-10-CM | POA: Diagnosis not present

## 2022-09-08 DIAGNOSIS — I5020 Unspecified systolic (congestive) heart failure: Secondary | ICD-10-CM | POA: Diagnosis not present

## 2022-09-08 DIAGNOSIS — N281 Cyst of kidney, acquired: Secondary | ICD-10-CM | POA: Diagnosis not present

## 2022-09-08 DIAGNOSIS — N400 Enlarged prostate without lower urinary tract symptoms: Secondary | ICD-10-CM | POA: Diagnosis not present

## 2022-09-08 DIAGNOSIS — Z79899 Other long term (current) drug therapy: Secondary | ICD-10-CM | POA: Diagnosis not present

## 2022-09-08 DIAGNOSIS — I951 Orthostatic hypotension: Secondary | ICD-10-CM | POA: Diagnosis not present

## 2022-09-08 DIAGNOSIS — I480 Paroxysmal atrial fibrillation: Secondary | ICD-10-CM | POA: Diagnosis not present

## 2022-09-08 DIAGNOSIS — D649 Anemia, unspecified: Secondary | ICD-10-CM | POA: Diagnosis not present

## 2022-09-08 DIAGNOSIS — D6859 Other primary thrombophilia: Secondary | ICD-10-CM | POA: Diagnosis not present

## 2022-09-08 DIAGNOSIS — I714 Abdominal aortic aneurysm, without rupture, unspecified: Secondary | ICD-10-CM | POA: Diagnosis not present

## 2022-09-08 DIAGNOSIS — Z111 Encounter for screening for respiratory tuberculosis: Secondary | ICD-10-CM | POA: Diagnosis not present

## 2022-09-08 DIAGNOSIS — E43 Unspecified severe protein-calorie malnutrition: Secondary | ICD-10-CM | POA: Diagnosis not present

## 2022-09-08 DIAGNOSIS — Z8719 Personal history of other diseases of the digestive system: Secondary | ICD-10-CM | POA: Diagnosis not present

## 2022-09-08 DIAGNOSIS — Z4682 Encounter for fitting and adjustment of non-vascular catheter: Secondary | ICD-10-CM | POA: Diagnosis not present

## 2022-09-08 DIAGNOSIS — N390 Urinary tract infection, site not specified: Secondary | ICD-10-CM | POA: Diagnosis not present

## 2022-09-08 DIAGNOSIS — N179 Acute kidney failure, unspecified: Secondary | ICD-10-CM | POA: Diagnosis not present

## 2022-09-08 DIAGNOSIS — J449 Chronic obstructive pulmonary disease, unspecified: Secondary | ICD-10-CM | POA: Diagnosis not present

## 2022-09-08 DIAGNOSIS — Z9049 Acquired absence of other specified parts of digestive tract: Secondary | ICD-10-CM | POA: Diagnosis not present

## 2022-09-08 DIAGNOSIS — R4182 Altered mental status, unspecified: Secondary | ICD-10-CM | POA: Diagnosis not present

## 2022-09-08 DIAGNOSIS — K9189 Other postprocedural complications and disorders of digestive system: Secondary | ICD-10-CM | POA: Diagnosis not present

## 2022-09-08 DIAGNOSIS — R262 Difficulty in walking, not elsewhere classified: Secondary | ICD-10-CM | POA: Diagnosis not present

## 2022-09-08 DIAGNOSIS — I7143 Infrarenal abdominal aortic aneurysm, without rupture: Secondary | ICD-10-CM | POA: Diagnosis not present

## 2022-09-08 DIAGNOSIS — M6281 Muscle weakness (generalized): Secondary | ICD-10-CM | POA: Diagnosis not present

## 2022-09-08 DIAGNOSIS — I2510 Atherosclerotic heart disease of native coronary artery without angina pectoris: Secondary | ICD-10-CM | POA: Diagnosis not present

## 2022-09-08 DIAGNOSIS — E1169 Type 2 diabetes mellitus with other specified complication: Secondary | ICD-10-CM | POA: Diagnosis not present

## 2022-09-08 DIAGNOSIS — I4891 Unspecified atrial fibrillation: Secondary | ICD-10-CM | POA: Diagnosis not present

## 2022-09-08 DIAGNOSIS — I5033 Acute on chronic diastolic (congestive) heart failure: Secondary | ICD-10-CM | POA: Diagnosis not present

## 2022-09-08 DIAGNOSIS — E782 Mixed hyperlipidemia: Secondary | ICD-10-CM | POA: Diagnosis not present

## 2022-09-08 DIAGNOSIS — R531 Weakness: Secondary | ICD-10-CM | POA: Diagnosis not present

## 2022-09-08 DIAGNOSIS — Z7401 Bed confinement status: Secondary | ICD-10-CM | POA: Diagnosis not present

## 2022-09-08 LAB — GLUCOSE, CAPILLARY
Glucose-Capillary: 99 mg/dL (ref 70–99)
Glucose-Capillary: 106 mg/dL — ABNORMAL HIGH (ref 70–99)

## 2022-09-08 MED ORDER — TRAMADOL HCL 50 MG PO TABS
50.0000 mg | ORAL_TABLET | Freq: Once | ORAL | Status: AC
  Administered 2022-09-08: 50 mg via ORAL
  Filled 2022-09-08: qty 1

## 2022-09-08 MED ORDER — INSULIN DETEMIR 100 UNIT/ML ~~LOC~~ SOLN: 8.0000 [IU] | Freq: Every day | SUBCUTANEOUS | 0 refills | Status: DC

## 2022-09-08 MED ORDER — FENTANYL 12 MCG/HR TD PT72: 1.0000 | MEDICATED_PATCH | TRANSDERMAL | 0 refills | Status: DC

## 2022-09-08 MED ORDER — INSULIN ASPART 100 UNIT/ML FLEXPEN: PEN_INJECTOR | SUBCUTANEOUS | 0 refills | Status: DC

## 2022-09-08 NOTE — Plan of Care (Signed)
Problem: Education: Goal: Knowledge of General Education information will improve Description: Including pain rating scale, medication(s)/side effects and non-pharmacologic comfort measures 09/08/2022 1106 by Rosey Bath, RN Outcome: Adequate for Discharge 09/08/2022 1106 by Rosey Bath, RN Outcome: Adequate for Discharge   Problem: Health Behavior/Discharge Planning: Goal: Ability to manage health-related needs will improve 09/08/2022 1106 by Rosey Bath, RN Outcome: Adequate for Discharge 09/08/2022 1106 by Rosey Bath, RN Outcome: Adequate for Discharge   Problem: Clinical Measurements: Goal: Ability to maintain clinical measurements within normal limits will improve 09/08/2022 1106 by Rosey Bath, RN Outcome: Adequate for Discharge 09/08/2022 1106 by Rosey Bath, RN Outcome: Adequate for Discharge Goal: Will remain free from infection 09/08/2022 1106 by Rosey Bath, RN Outcome: Adequate for Discharge 09/08/2022 1106 by Rosey Bath, RN Outcome: Adequate for Discharge Goal: Diagnostic test results will improve 09/08/2022 1106 by Rosey Bath, RN Outcome: Adequate for Discharge 09/08/2022 1106 by Rosey Bath, RN Outcome: Adequate for Discharge Goal: Respiratory complications will improve 09/08/2022 1106 by Rosey Bath, RN Outcome: Adequate for Discharge 09/08/2022 1106 by Rosey Bath, RN Outcome: Adequate for Discharge Goal: Cardiovascular complication will be avoided 09/08/2022 1106 by Rosey Bath, RN Outcome: Adequate for Discharge 09/08/2022 1106 by Rosey Bath, RN Outcome: Adequate for Discharge   Problem: Activity: Goal: Risk for activity intolerance will decrease 09/08/2022 1106 by Rosey Bath, RN Outcome: Adequate for Discharge 09/08/2022 1106 by Rosey Bath, RN Outcome: Adequate for Discharge   Problem: Nutrition: Goal: Adequate nutrition will be maintained 09/08/2022 1106 by Rosey Bath,  RN Outcome: Adequate for Discharge 09/08/2022 1106 by Rosey Bath, RN Outcome: Adequate for Discharge   Problem: Coping: Goal: Level of anxiety will decrease 09/08/2022 1106 by Rosey Bath, RN Outcome: Adequate for Discharge 09/08/2022 1106 by Rosey Bath, RN Outcome: Adequate for Discharge   Problem: Elimination: Goal: Will not experience complications related to bowel motility 09/08/2022 1106 by Rosey Bath, RN Outcome: Adequate for Discharge 09/08/2022 1106 by Rosey Bath, RN Outcome: Adequate for Discharge Goal: Will not experience complications related to urinary retention 09/08/2022 1106 by Rosey Bath, RN Outcome: Adequate for Discharge 09/08/2022 1106 by Rosey Bath, RN Outcome: Adequate for Discharge   Problem: Pain Managment: Goal: General experience of comfort will improve 09/08/2022 1106 by Rosey Bath, RN Outcome: Adequate for Discharge 09/08/2022 1106 by Rosey Bath, RN Outcome: Adequate for Discharge   Problem: Safety: Goal: Ability to remain free from injury will improve 09/08/2022 1106 by Rosey Bath, RN Outcome: Adequate for Discharge 09/08/2022 1106 by Rosey Bath, RN Outcome: Adequate for Discharge   Problem: Skin Integrity: Goal: Risk for impaired skin integrity will decrease 09/08/2022 1106 by Rosey Bath, RN Outcome: Adequate for Discharge 09/08/2022 1106 by Rosey Bath, RN Outcome: Adequate for Discharge   Problem: Education: Goal: Ability to describe self-care measures that may prevent or decrease complications (Diabetes Survival Skills Education) will improve 09/08/2022 1106 by Rosey Bath, RN Outcome: Adequate for Discharge 09/08/2022 1106 by Rosey Bath, RN Outcome: Adequate for Discharge Goal: Individualized Educational Video(s) 09/08/2022 1106 by Rosey Bath, RN Outcome: Adequate for Discharge 09/08/2022 1106 by Rosey Bath, RN Outcome: Adequate for Discharge    Problem: Coping: Goal: Ability to adjust to condition or change in health will improve 09/08/2022 1106 by Rosey Bath, RN Outcome: Adequate for Discharge 09/08/2022 1106 by Rosey Bath, RN Outcome: Adequate for Discharge   Problem: Fluid Volume: Goal: Ability to maintain a balanced intake and output will improve 09/08/2022 1106 by Rosey Bath, RN Outcome: Adequate for Discharge 09/08/2022 1106 by Rosey Bath,  RN Outcome: Adequate for Discharge   Problem: Health Behavior/Discharge Planning: Goal: Ability to identify and utilize available resources and services will improve 09/08/2022 1106 by Rosey Bath, RN Outcome: Adequate for Discharge 09/08/2022 1106 by Rosey Bath, RN Outcome: Adequate for Discharge Goal: Ability to manage health-related needs will improve 09/08/2022 1106 by Rosey Bath, RN Outcome: Adequate for Discharge 09/08/2022 1106 by Rosey Bath, RN Outcome: Adequate for Discharge   Problem: Metabolic: Goal: Ability to maintain appropriate glucose levels will improve 09/08/2022 1106 by Rosey Bath, RN Outcome: Adequate for Discharge 09/08/2022 1106 by Rosey Bath, RN Outcome: Adequate for Discharge   Problem: Nutritional: Goal: Maintenance of adequate nutrition will improve 09/08/2022 1106 by Rosey Bath, RN Outcome: Adequate for Discharge 09/08/2022 1106 by Rosey Bath, RN Outcome: Adequate for Discharge Goal: Progress toward achieving an optimal weight will improve 09/08/2022 1106 by Rosey Bath, RN Outcome: Adequate for Discharge 09/08/2022 1106 by Rosey Bath, RN Outcome: Adequate for Discharge   Problem: Skin Integrity: Goal: Risk for impaired skin integrity will decrease 09/08/2022 1106 by Rosey Bath, RN Outcome: Adequate for Discharge 09/08/2022 1106 by Rosey Bath, RN Outcome: Adequate for Discharge   Problem: Tissue Perfusion: Goal: Adequacy of tissue perfusion will  improve 09/08/2022 1106 by Rosey Bath, RN Outcome: Adequate for Discharge 09/08/2022 1106 by Rosey Bath, RN Outcome: Adequate for Discharge   Problem: Education: Goal: Knowledge of General Education information will improve Description: Including pain rating scale, medication(s)/side effects and non-pharmacologic comfort measures 09/08/2022 1106 by Rosey Bath, RN Outcome: Adequate for Discharge 09/08/2022 1106 by Rosey Bath, RN Outcome: Adequate for Discharge   Problem: Health Behavior/Discharge Planning: Goal: Ability to manage health-related needs will improve 09/08/2022 1106 by Rosey Bath, RN Outcome: Adequate for Discharge 09/08/2022 1106 by Rosey Bath, RN Outcome: Adequate for Discharge   Problem: Clinical Measurements: Goal: Ability to maintain clinical measurements within normal limits will improve 09/08/2022 1106 by Rosey Bath, RN Outcome: Adequate for Discharge 09/08/2022 1106 by Rosey Bath, RN Outcome: Adequate for Discharge Goal: Will remain free from infection 09/08/2022 1106 by Rosey Bath, RN Outcome: Adequate for Discharge 09/08/2022 1106 by Rosey Bath, RN Outcome: Adequate for Discharge Goal: Diagnostic test results will improve 09/08/2022 1106 by Rosey Bath, RN Outcome: Adequate for Discharge 09/08/2022 1106 by Rosey Bath, RN Outcome: Adequate for Discharge Goal: Respiratory complications will improve 09/08/2022 1106 by Rosey Bath, RN Outcome: Adequate for Discharge 09/08/2022 1106 by Rosey Bath, RN Outcome: Adequate for Discharge Goal: Cardiovascular complication will be avoided 09/08/2022 1106 by Rosey Bath, RN Outcome: Adequate for Discharge 09/08/2022 1106 by Rosey Bath, RN Outcome: Adequate for Discharge   Problem: Activity: Goal: Risk for activity intolerance will decrease 09/08/2022 1106 by Rosey Bath, RN Outcome: Adequate for Discharge 09/08/2022 1106 by  Rosey Bath, RN Outcome: Adequate for Discharge   Problem: Nutrition: Goal: Adequate nutrition will be maintained 09/08/2022 1106 by Rosey Bath, RN Outcome: Adequate for Discharge 09/08/2022 1106 by Rosey Bath, RN Outcome: Adequate for Discharge   Problem: Coping: Goal: Level of anxiety will decrease 09/08/2022 1106 by Rosey Bath, RN Outcome: Adequate for Discharge 09/08/2022 1106 by Rosey Bath, RN Outcome: Adequate for Discharge   Problem: Elimination: Goal: Will not experience complications related to bowel motility 09/08/2022 1106 by Rosey Bath, RN Outcome: Adequate for Discharge 09/08/2022 1106 by Rosey Bath, RN Outcome: Adequate for Discharge Goal: Will not experience complications related to urinary retention 09/08/2022 1106 by Rosey Bath, RN Outcome: Adequate for Discharge 09/08/2022 1106 by Rosey Bath, RN Outcome: Adequate for Discharge  Problem: Pain Managment: Goal: General experience of comfort will improve 09/08/2022 1106 by Rosey Bath, RN Outcome: Adequate for Discharge 09/08/2022 1106 by Rosey Bath, RN Outcome: Adequate for Discharge   Problem: Safety: Goal: Ability to remain free from injury will improve 09/08/2022 1106 by Rosey Bath, RN Outcome: Adequate for Discharge 09/08/2022 1106 by Rosey Bath, RN Outcome: Adequate for Discharge   Problem: Skin Integrity: Goal: Risk for impaired skin integrity will decrease 09/08/2022 1106 by Rosey Bath, RN Outcome: Adequate for Discharge 09/08/2022 1106 by Rosey Bath, RN Outcome: Adequate for Discharge

## 2022-09-08 NOTE — Plan of Care (Signed)

## 2022-09-08 NOTE — Progress Notes (Signed)
Referring Physician(s): Dr Ronnie Derby  Supervising Physician: Jacqulynn Cadet  Patient Status:  [facility] - In-pt  Chief Complaint:  Hepatic abscess  Subjective:  Drain placed in IR 8/15 Upsized 8/24 OP remains significant To SNF soon---when bed available Will need flushed 3x/day  Allergies: Patient has no known allergies.  Medications: Prior to Admission medications   Medication Sig Start Date End Date Taking? Authorizing Provider  acetaminophen (TYLENOL) 325 MG tablet Take 2 tablets (650 mg total) by mouth every 6 (six) hours as needed for mild pain (or Fever >/= 101). 04/30/22  Yes Sheikh, Omair Latif, DO  amiodarone (PACERONE) 200 MG tablet Take 1 tablet (200 mg total) by mouth daily. 05/29/22  Yes Loel Dubonnet, NP  atorvastatin (LIPITOR) 40 MG tablet Take 40 mg by mouth every evening.   Yes [provider]  lisinopril-hydrochlorothiazide (ZESTORETIC) 10-12.5 MG tablet Take 0.5 tablets by mouth daily.   Yes [provider]  metFORMIN (GLUCOPHAGE-XR) 500 MG 24 hr tablet Take 500-1,000 mg by mouth See admin instructions. Take 2 tablets (1000 mg) by mouth in the morning and 1 tablet (500 mg) every evening 03/17/22  Yes [provider]  metoprolol tartrate (LOPRESSOR) 25 MG tablet Take 1 tablet (25 mg total) by mouth 2 (two) times daily. 05/29/22  Yes Loel Dubonnet, NP  Multiple Vitamin (MULTIVITAMIN WITH MINERALS) TABS tablet Take 1 tablet by mouth daily.   Yes [provider]  ondansetron (ZOFRAN) 4 MG tablet Take 1 tablet (4 mg total) by mouth every 6 (six) hours as needed for nausea. 04/30/22  Yes Sheikh, Omair Latif, DO  pantoprazole (PROTONIX) 40 MG tablet Take 40 mg by mouth every morning.   Yes [provider]  polyethylene glycol powder (GLYCOLAX/MIRALAX) 17 GM/SCOOP powder Take 17 g by mouth daily. Patient taking differently: Take 17 g by mouth daily as needed for mild constipation or moderate constipation. 04/30/22  Yes Sheikh,  Omair Latif, DO  senna-docusate (SENOKOT-S) 8.6-50 MG tablet Take 1 tablet by mouth at bedtime. Patient taking differently: Take 1 tablet by mouth at bedtime as needed for mild constipation or moderate constipation. 04/30/22  Yes Sheikh, Omair Latif, DO  SPIRIVA RESPIMAT 2.5 MCG/ACT AERS Inhale 2 puffs into the lungs daily as needed (COPD). 06/04/22  Yes [provider]  tadalafil (CIALIS) 20 MG tablet Take 20 mg by mouth every three (3) days as needed for erectile dysfunction. 03/29/22  Yes [provider]  tamsulosin (FLOMAX) 0.4 MG CAPS capsule Take 0.4 mg by mouth daily after supper. 05/13/22  Yes [provider]  apixaban (ELIQUIS) 5 MG TABS tablet Take 1 tablet (5 mg total) by mouth 2 (two) times daily. Patient not taking: Reported on 07/31/2022 05/29/22   Loel Dubonnet, NP  oxyCODONE-acetaminophen (PERCOCET) 5-325 MG tablet Take 1 tablet by mouth every 4 (four) hours as needed for severe pain. Patient not taking: Reported on 07/31/2022 07/14/22 07/14/23  Norm Parcel, PA-C     Vital Signs: BP (!) 104/52 (BP Location: Left Arm)   Pulse 81   Temp 98.4 F (36.9 C) (Oral)   Resp 20   Ht '6\' 1"'$  (1.854 m)   Wt 173 lb 15.1 oz (78.9 kg)   SpO2 98%   BMI 22.95 kg/m   Physical Exam Skin:    General: Skin is warm.     Comments: Site is clean and dry NT no bleeding No sign of infection OP serous color 155 cc yesterday Flushes easily; aspiration  slow      Imaging: No results found.  Labs:  CBC: Recent Labs    08/28/22 0746 08/29/22 0538 08/30/22 0354 09/06/22 0232  WBC 15.0* 10.9* 6.8 7.0  HGB 10.5* 11.2* 10.6* 11.1*  HCT 32.1* 33.2* 31.5* 33.8*  PLT 277 254 276 251    COAGS: Recent Labs    04/26/22 0843 08/01/22 1832 08/02/22 0703 08/11/22 0941  INR 1.3*  --   --  1.1  APTT  --  121* 73*  --     BMP: Recent Labs    08/25/22 0250 08/27/22 0433 08/28/22 0746 09/06/22 0232  NA 137 137 135 135  K 4.5 4.4 4.4 4.2  CL 103 102 100 101   CO2 '27 29 27 27  '$ GLUCOSE 88 84 101* 97  BUN 25* 24* 23 25*  CALCIUM 9.9 9.9 9.7 9.1  CREATININE 1.07 1.33* 1.13 1.06  GFRNONAA >60 54* >60 >60    LIVER FUNCTION TESTS: Recent Labs    08/02/22 0703 08/03/22 0819 08/10/22 0747 08/27/22 0433  BILITOT 1.0 1.0 0.7 0.5  AST '19 18 17 19  '$ ALT '20 17 16 29  '$ ALKPHOS 72 64 73 72  PROT 5.5* 5.0* 5.4* 6.3*  ALBUMIN 2.3* 2.0* 1.8* 2.1*     Drain Location: RUQ Size: Fr size: 12 Fr Date of placement: 8/15--- upsized 8/24  Currently to: Drain collection device: suction bulb 24 hour output:  Output by Drain (mL) 09/06/22 0701 - 09/06/22 1900 09/06/22 1901 - 09/07/22 0700 09/07/22 0701 - 09/07/22 1900 09/07/22 1901 - 09/08/22 0655  Closed System Drain 1 Lateral;Right Abdomen 12 Fr. 80 15 10 145    Interval imaging/drain manipulation:   Most recent CT 9/4 IMPRESSION: 1. Mild interval decrease in the size of the subcapsular collection along the right margin of the liver. Stable position of the percutaneously placed pigtail catheter. 2. Irregular fluid collection or cyst along the pancreatic head is also unchanged, associated with mild adjacent inflammatory changes, which have improved from the prior CT. Findings suggest resolving pancreatitis.  Current examination: Flushes easily/aspirates slowly.  Insertion site unremarkable. Suture and stat lock in place. Dressed appropriately.   Plan: Continue TID flushes with 5 cc NS. Record output Q shift. Dressing changes QD or PRN if soiled.  Call IR APP or on call IR MD if difficulty flushing or sudden change in drain output.  Repeat imaging/possible drain injection once output < 10 mL/QD (excluding flush material). Consideration for drain removal if output is < 10 mL/QD (excluding flush material), pending discussion with the providing surgical service.  Discharge planning: Please contact IR APP or on call IR MD prior to patient d/c to ensure appropriate follow up plans are in place.  Typically patient will follow up with IR clinic 10-14 days post d/c for repeat imaging/possible drain injection. IR scheduler will contact patient with date/time of appointment. Patient will need to flush drainTID with 5 cc NS, record output QD, dressing changes every 2-3 days or earlier if soiled.   IR will continue to follow - please call with questions or concerns.  Electronically Signed: Lavonia Drafts, PA-C 09/08/2022, 6:55 AM   I spent a total of 15 Minutes at the the patient's bedside AND on the patient's hospital floor or unit, greater than 50% of which was counseling/coordinating care for hepatic abscess drain

## 2022-09-08 NOTE — Progress Notes (Signed)
Pt ordered to discharge. AVS reviewed with RN Ladora Daniel and PTAR. Education provided to patient as needed.

## 2022-09-08 NOTE — Discharge Summary (Signed)
Douglas Hurst. HLK:[id number] DOB: October 16, 1941 DOA: 07/30/2022  PCP: Lavone Orn, MD  Admit date: 07/30/2022  Discharge date: 09/08/2022  Admitted From: Home   Disposition:  SNF   Recommendations for Outpatient Follow-up:   Follow up with PCP in 1-2 weeks  PCP Please obtain BMP/CBC, 2 view CXR in 1week,  (see Discharge instructions)   PCP Please follow up on the following pending results:    Home Health: None   Equipment/Devices: None  Consultations: IR, Palliative care, his cardiologist Dr. Stanford Breed over the phone, GI Discharge Condition: Stable    CODE STATUS: Full    Diet Recommendation: [place] 3 diet check CBGs q. [facility] S     Chief Complaint  Patient presents with   Abdominal Pain     Brief history of present illness from the day of admission and additional interim summary    81 y.o.  male with history of PAF on Eliquis, HTN, HLD, COPD, chronic HFrEF (recovered EF)-with recent history of cholecystitis-s/p cholecystectomy but with retained CBD stone.  ERCP here was unsuccessful.  Patient was referred to [facility] underwent advanced endoscopy via transduodenal approach for ERCP on 8/3 at [facility] with Dr. Okey Dupre -he subsequently presented to the ED with severe abdominal pain-was found to have pancreatitis and admitted to the hospitalist service.  Course was complicated by A-fib RVR, pseudocyst formation, subhepatic fluid collection requiring CT-guided drain placement.     Significant events: 8/3>> admit to [facility]-abdominal pain after ERCP at [facility]-found to have acute pancreatitis 8/5>> A-fib RVR-no response to IV Lopressor-blood pressure soft-amiodarone infusion started.  After discussion with GI-heparin started without bolus.   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 8/5>> CT abdomen/pelvis: Resolution of hypoenhancement of the pancreas-persistent inflammatory changes within the retroperitoneum surrounding the head of the pancreas extending into the porta hepatis.  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 8/13>> CT abdomen/pelvis: Increased size of large perihepatic pseudocysts 8/23>> CT abdomen/pelvis: Interval decrease in loculated perihepatic fluid collection after percutaneous drain placement. 9/04>> CT abdomen/pelvis: Mild decrease in subcapsular collection, findings suggest resolving pancreatitis.   Significant microbiology data: 8/15>> perihepatic fluid culture: No growth   Procedures: 8/15>> CT-guided drain placement right subcapsular fluid collection. 8/16>>Cortak placed (removed 8/30) 8/24>> s/p exchange and size up of perihepatic drain.   Consults: GI Phone consult-cards-Dr [place] Hospital Course    Acute post ERCP pancreatitis with pseudocyst formation: Overall improved-NG tube removed 8/30-tolerating advancement in diet.  By speech tolerating dysphagia  3 diet for several days continue intermittent speech therapy follow-up at SNF.   Subhepatic space fluid collection: Likely due to leak from ERCP done via trans duodenal approach.  Drain in place-IR following.  Repeat CT on 9/4 continues to show subhepatic collection-drain remains in place.  IR continues to follow-plans to discharge with drain-IR will arrange for outpatient follow-up.  Continue 3 times daily flushes.  Post discharged follow-up with IR in 10 to 14 days.  Medically stable await SNF bed, likely discharge on 09/08/2022.   Drain care. Patient will need to  flush drain QD with 5 cc NS, record output QD, dressing changes every 2-3 days or earlier if soiled.    PAF with RVR: Due to acute illness-maintaining sinus rhythm-on amiodarone-and Eliquis.   Acute hypoxic respiratory failure: Due to pleural effusion/atelectasis-improved-on room air.  Continue mobilization. Encourage incentive spirometry every hour while awake, encouraged to sit up in chair in the daytime at SNF.   AKI: Resolved-likely hemodynamically mediated.   Acute on chronic HFrEF with recovered EF: Volume status stable-no longer on furosemide.   Orthostatic hypotension: Noted on 9/1 with PT-gently hydrated.  Stable continue to monitor at SNF.   Normocytic anemia: Due to critical/acute illness-no evidence of blood loss.  Follow CBC periodically at SNF.Marland Kitchen  HTN: BP stable-continue metoprolol.   BPH: Continue Flomax   COPD: Continue bronchodilators   Infrarenal abdominal aortic aneurysm 4 mm: Unchanged from prior study-annual follow-up is required.   Debility/deconditioning: Await SNF bed .  DM-2 (A1c 5.6 on 7/7): CBGs stable on Lantus along with sliding scale.  Resume metformin upon discharge check CBGs q. ACH S at SNF and adjust as needed.    Lab Results  Component Value Date   HGBA1C 5.6 07/03/2022   CBG (last 3)  Recent Labs    09/07/22 2347 09/08/22 0411 09/08/22 0815  GLUCAP 99 99 106*    Discharge diagnosis     Principal Problem:   Acute pancreatitis Active Problems:   Protein-calorie malnutrition, severe    Discharge instructions    Discharge Instructions     Discharge instructions   Complete by: As directed    Follow with Primary MD Lavone Orn, MD in 7 days   Get CBC, CMP, magnesium-  checked next visit within 1 week by  SNF MD    Activity: As tolerated with Full fall precautions use walker/cane & assistance as needed  Disposition SNF  Diet: Dysphagia 3 diet with feeding assistance and aspiration precautions.  CBGs q. [place].  Special  Instructions: If you have smoked or chewed Tobacco  in the last 2 yrs please stop smoking, stop any regular Alcohol  and or any Recreational drug use.  On your next visit with your primary care physician please Get Medicines reviewed and adjusted.  Please request your Prim.MD to go over all Hospital Tests and Procedure/Radiological results at the follow up, please get all Hospital records sent to your Prim MD by signing hospital release before you go home.  If you experience worsening of your admission symptoms, develop shortness of breath, life threatening emergency, suicidal or homicidal thoughts you must seek medical attention immediately by calling 911 or calling your MD immediately  if symptoms less severe.  You Must read complete instructions/literature along with all the possible adverse reactions/side effects for all the Medicines you take and that have been prescribed to you. Take any new Medicines after you have completely understood and accpet all the possible adverse reactions/side effects.   Discharge  wound care:   Complete by: As directed    Drain care. Patient will need to flush drain QD with 5 cc NS, record output QD, dressing changes every 2-3 days or earlier if soiled.   Increase activity slowly   Complete by: As directed        Discharge Medications   Allergies as of 09/08/2022   No Known Allergies      Medication List     STOP taking these medications    lisinopril-hydrochlorothiazide 10-12.5 MG tablet Commonly known as: ZESTORETIC   oxyCODONE-acetaminophen 5-325 MG tablet Commonly known as: Percocet       TAKE these medications    acetaminophen 325 MG tablet Commonly known as: TYLENOL Take 2 tablets (650 mg total) by mouth every 6 (six) hours as needed for mild pain (or Fever >/= 101).   amiodarone 200 MG tablet Commonly known as: PACERONE Take 1 tablet (200 mg total) by mouth daily.   apixaban 5 MG Tabs tablet Commonly known as: ELIQUIS Take 1  tablet (5 mg total) by mouth 2 (two) times daily.   atorvastatin 40 MG tablet Commonly known as: LIPITOR Take 40 mg by mouth every evening.   fentaNYL 12 MCG/HR Commonly known as: [place] 1 patch onto the skin every 3 (three) days. Start taking on: September 09, 2022   insulin aspart 100 UNIT/ML FlexPen Commonly known as: NOVOLOG Before each meal 3 times a day, 140-199 - 2 units, 200-250 - 4 units, 251-299 - 6 units,  300-349 - 8 units,  350 or above 10 units. Insulin PEN if approved, provide syringes and needles if needed.Please switch to any approved short acting Insulin if needed.   insulin detemir 100 UNIT/ML injection Commonly known as: LEVEMIR Inject 0.08 mLs (8 Units total) into the skin at bedtime.   metFORMIN 500 MG 24 hr tablet Commonly known as: GLUCOPHAGE-XR Take 500-1,000 mg by mouth See admin instructions. Take 2 tablets (1000 mg) by mouth in the morning and 1 tablet (500 mg) every evening   metoprolol tartrate 25 MG tablet Commonly known as: LOPRESSOR Take 1 tablet (25 mg total) by mouth 2 (two) times daily.   multivitamin with minerals Tabs tablet Take 1 tablet by mouth daily.   ondansetron 4 MG tablet Commonly known as: ZOFRAN Take 1 tablet (4 mg total) by mouth every 6 (six) hours as needed for nausea.   pantoprazole 40 MG tablet Commonly known as: PROTONIX Take 40 mg by mouth every morning.   polyethylene glycol powder 17 GM/SCOOP powder Commonly known as: GLYCOLAX/MIRALAX Take 17 g by mouth daily. What changed:  when to take this reasons to take this   Senexon-S 8.6-50 MG tablet Generic drug: senna-docusate Take 1 tablet by mouth at bedtime. What changed:  when to take this reasons to take this   Spiriva Respimat 2.5 MCG/ACT Aers Generic drug: Tiotropium Bromide Monohydrate Inhale 2 puffs into the lungs daily as needed (COPD).   tadalafil 20 MG tablet Commonly known as: CIALIS Take 20 mg by mouth every three (3) days as needed for  erectile dysfunction.   tamsulosin 0.4 MG Caps capsule Commonly known as: FLOMAX Take 0.4 mg by mouth daily after supper.               Discharge Care Instructions  (From admission, onward)           Start     Ordered   09/08/22 0000  Discharge wound care:  Comments: Drain care. Patient will need to flush drain QD with 5 cc NS, record output QD, dressing changes every 2-3 days or earlier if soiled.   09/08/22 0919             Contact information for follow-up providers     Lavone Orn, MD. Schedule an appointment as soon as possible for a visit in 1 week(s).   Specialty: Internal Medicine Contact information: [address] [place] [address] [phone]         Skeet Latch, MD .   Specialty: Cardiology Contact information: [address] [place] [address] [phone]         Aletta Edouard, MD. Schedule an appointment as soon as possible for a visit in 1 week(s).   Specialties: Interventional Radiology, Radiology Why: Cholecystostomy drain care Contact information: [address] [place] [address] [phone]              Contact information for after-discharge care     Destination     [facility] Preferred SNF .   Service: Skilled Nursing Contact information: [place] [phone]                     Major procedures and Radiology Reports - PLEASE review detailed and final reports thoroughly  -        CT ABDOMEN PELVIS W CONTRAST  Result Date: 08/31/2022 CLINICAL DATA:  Intra-abdominal/hepatic abscess. Drain in place. Follow-up study. EXAM: CT ABDOMEN AND PELVIS WITH CONTRAST TECHNIQUE: Multidetector CT imaging of the abdomen and pelvis was performed using the standard protocol following bolus administration of intravenous contrast. RADIATION DOSE REDUCTION: This exam was performed according  to the departmental dose-optimization program which includes automated exposure control, adjustment of the mA and/or kV according to patient size and/or use of iterative reconstruction technique. CONTRAST:  127m OMNIPAQUE IOHEXOL 300 MG/ML  SOLN COMPARISON:  08/19/2022 and 08/09/2022. FINDINGS: Lower chest: Small right and trace left pleural effusions. Dependent lung base opacities, most evident in the right lower lobe, consistent with atelectasis. Right pleural effusion slightly increased in size from the prior CT. No other change. Hepatobiliary: Subcapsular fluid collection overlies the right side of the liver, currently measuring 15 cm from anterior to posterior by 11 cm from superior to inferior, by 2 cm in thickness. Collection measures 3 cm in thickness on the prior study, and a 17 x 15 cm AP x SI. Pigtail drainage catheter lies within the collection, unchanged from the prior study. Trace amount of fluid lies adjacent to the collection along the anterolateral margin of the liver. No liver mass.  Status post cholecystectomy.  No bile duct dilation. Pancreas: Irregular fluid collection projects along the anterior superior aspect of the pancreatic head, unchanged from the prior CT. No other pancreatic mass or lesion. No duct dilation. Mild adjacent inflammatory improved from the prior CT. Spleen: Normal in size without focal abnormality. Adrenals/Urinary Tract: Mild left adrenal gland thickening. No discrete adrenal nodule. 1.6 cm right renal cyst, stable with no follow-up recommended. No renal stones. No hydronephrosis. Normal ureters. Mild bladder wall thickening. No bladder mass. Stomach/Bowel: Normal stomach. Small bowel and colon are normal in caliber. No wall thickening. No bowel inflammation. Scattered left colon diverticula. Vascular/Lymphatic: Infrarenal abdominal aortic aneurysm, 3.9 cm, unchanged. 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 No enlarged lymph nodes. Reproductive: Stable enlarged prostate.  Other: No ascites. Musculoskeletal: No fracture  or acute finding. Stable benign-appearing lucent lesion expands the left iliac wing, thin sclerotic margin. No fracture. No aggressive bone lesion. IMPRESSION: 1. Mild interval decrease in the size of the subcapsular collection along the right margin of the liver. Stable position of the percutaneously placed pigtail catheter. 2. Irregular fluid collection or cyst along the pancreatic head is also unchanged, associated with mild adjacent inflammatory changes, which have improved from the prior CT. Findings suggest resolving pancreatitis. 3. Small right and trace left pleural effusions with associated dependent atelectasis most evident at the right lung base, similar to the prior CT. 4. Stable 3.9 cm infrarenal abdominal aortic aneurysm. Recommend follow-up ultrasound every 2 years. This recommendation follows ACR consensus guidelines: White Paper of the ACR Incidental Findings Committee II on Vascular Findings. J Am Coll Radiol 2013; 10:789-794. Electronically Signed   By: Lajean Manes M.D.   On: 08/31/2022 10:00   IR Catheter Tube Change  Result Date: 08/20/2022 INDICATION: 81 year old male with perihepatic drain and residual subcapsular/perihepatic fluid. He presents for exchange/upsize EXAM: IMAGE GUIDED DRAIN EXCHANGE/UPSIZE MEDICATIONS: None ANESTHESIA/SEDATION: None COMPLICATIONS: None PROCEDURE: Informed written consent was obtained from the patient after a thorough discussion of the procedural risks, benefits and alternatives. All questions were addressed. Maximal Sterile Barrier Technique was utilized including caps, mask, sterile gowns, sterile gloves, sterile drape, hand hygiene and skin antiseptic. A timeout was performed prior to the initiation of the procedure. Patient positioned supine under the image intensifier. The right abdomen in the indwelling drain were prepped and draped in the usual sterile fashion. 1% lidocaine was used for local  anesthesia. Contrast was injected, confirming adequate location within the perihepatic fluid collection. 035 wire was advanced through the catheter and the catheter was removed. [place] the catheter was used in attempt to manipulate the drain into a more favorable location. Combination of an Amplatz wire and a stiff Glidewire were used. A new 12 French drain was positioned in a more cephalad location, where there was communicating fluid and the pigtail catheter was locked. Contrast was injected confirming adequate location. The drain was attached to bulb suction. Patient tolerated the procedure well and remained hemodynamically stable throughout. No complications were encountered and no significant blood loss. FINDINGS: Injection of the drain in minute elation of the drain are suggestive that there is some significant loculations/compartmentalization to the right perihepatic fluid collection, which is not evident on prior CT. New 12 French drain in the perihepatic fluid collection. IMPRESSION: Status post exchange and up size of perihepatic biloma drain. Signed, Dulcy Fanny. Nadene Rubins, RPVI [facility] Radiology Electronically Signed   By: Corrie Mckusick D.O.   On: 08/20/2022 17:08   CT ABDOMEN PELVIS W CONTRAST  Result Date: 08/19/2022 CLINICAL DATA:  Pancreatitis, follow-up of pseudocyst EXAM: CT ABDOMEN AND PELVIS WITH CONTRAST TECHNIQUE: Multidetector CT imaging of the abdomen and pelvis was performed using the standard protocol following bolus administration of intravenous contrast. RADIATION DOSE REDUCTION: This exam was performed according to the departmental dose-optimization program which includes automated exposure control, adjustment of the mA and/or kV according to patient size and/or use of iterative reconstruction technique. CONTRAST:  127m OMNIPAQUE IOHEXOL 300 MG/ML  SOLN COMPARISON:  Previous studies including the examination of 08/09/2022  FINDINGS: Lower chest: There is interval decrease in bilateral pleural effusions. Small residual bilateral effusions are seen, more so on the right side. There is interval improvement in the aeration of lower lung fields with residual atelectasis/pneumonia in both lower lung  fields, more so on the right side. Coronary artery calcifications are seen. Hepatobiliary: There is interval placement of percutaneous drain into the loculated fluid collection along the lateral aspect of the liver. There is partial clearing of loculated perihepatic fluid collection. In the current study, maximum transverse diameter of the collection measures 3 cm. No focal abnormalities are seen in liver parenchyma. There is no dilation of bile ducts. Surgical clips are seen in gallbladder fossa. Pancreas: There is 3.7 x 1.5 cm loculated fluid collection along the anterior aspect of head of the pancreas with interval decrease in size. Head of the pancreas is larger in comparison to the body and tail with adjacent stranding suggesting resolving pancreatitis. There are no new loculated fluid collections in or around the pancreas. Spleen: Unremarkable. Adrenals/Urinary Tract: Hyperplasia of left adrenal and possible subcentimeter low-density in the right adrenal appear stable. There is no hydronephrosis. There is 1.9 cm cyst in lateral upper pole of right kidney with no change. There are scattered arterial calcifications. No renal or ureteral stones are seen. Urinary bladder is not distended. There is mild diffuse wall thickening in the bladder. Stomach/Bowel: Tip of enteric tube is seen in the distal antrum of stomach. Small bowel loops are not dilated. The appendix is not seen. There is no wall thickening in colon. Scattered diverticula are seen in colon without signs of focal acute diverticulitis. 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 There is ectasia of left  common iliac artery measuring 1.6 cm. Reproductive: Prostate is enlarged projecting into the base of the bladder. Other: There is no pneumoperitoneum or significant ascites. Umbilical hernia containing fat is seen. Musculoskeletal: Degenerative changes are noted in lumbar spine. IMPRESSION: There is interval decrease in loculated perihepatic fluid collection after placement of percutaneous drain. There is 3.7 x 1.5 cm loculated fluid collection along the anterior margin of head of the pancreas with interval decrease in size suggesting resolving pseudocyst. There are no new loculated fluid collections in or around the pancreas. There is no evidence of intestinal obstruction or pneumoperitoneum. There is no hydronephrosis. There is interval decrease in bilateral pleural effusions with small residual effusions on both sides, more so on the right side. There is interval decrease in patchy infiltrates in both lower lung fields suggesting resolving atelectasis/pneumonia. 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 Follow-up imaging every 2 years may be considered. Coronary artery disease. Enlarged prostate. Other findings as described in the body of the report. Electronically Signed   By: Elmer Picker M.D.   On: 08/19/2022 13:41   DG Abd Portable 1V  Result Date: 08/12/2022 CLINICAL DATA:  Feeding tube placement EXAM: PORTABLE ABDOMEN - 1 VIEW COMPARISON:  August 09, 2022 FINDINGS: The tip of the enteric tube projects over the expected region of the pylorus/proximal duodenum. Nonobstructive bowel gas pattern. Coils overlying the pelvis. Of note, the lateral aspect of the left hemiabdomen is excluded from the field of view. No acute osseous abnormality. IMPRESSION: The enteric tube tip is near the distal pylorus/proximal duodenum. Electronically Signed   By: Beryle Flock M.D.   On:  08/12/2022 11:49   CT GUIDED VISCERAL FLUID DRAIN BY PERC CATH  Result Date: 08/12/2022 INDICATION: 82 year old male with history of recent pancreatitis and subcapsular right hepatic fluid collection concerning for  abscess, possible biloma. EXAM: CT IMAGE GUIDED DRAINAGE BY PERCUTANEOUS CATHETER COMPARISON:  08/09/2022 MEDICATIONS: The patient is currently admitted to the hospital and receiving intravenous antibiotics. The antibiotics were administered within an appropriate time frame prior to the initiation of the procedure. ANESTHESIA/SEDATION: Moderate (conscious) sedation was employed during this procedure. A total of Versed 1.5 mg and Fentanyl 50 mcg was administered intravenously. Moderate Sedation Time: 11 minutes. The patient's level of consciousness and vital signs were monitored continuously by radiology nursing throughout the procedure under my direct supervision. CONTRAST:  None COMPLICATIONS: None immediate. PROCEDURE: RADIATION DOSE REDUCTION: This exam was performed according to the departmental dose-optimization program which includes automated exposure control, adjustment of the mA and/or kV according to patient size and/or use of iterative reconstruction technique. Informed written consent was obtained from the patient after a discussion of the risks, benefits and alternatives to treatment. The patient was placed supine on the CT gantry and a pre procedural CT was performed re-demonstrating the known abscess/fluid collection within the subcapsular aspect of the right lobe of the liver. The procedure was planned. A timeout was performed prior to the initiation of the procedure. The right upper quadrant was prepped and draped in the usual sterile fashion. The overlying soft tissues were anesthetized with 1% lidocaine with epinephrine. Appropriate trajectory was planned with the use of a 22 gauge spinal needle. An 18 gauge trocar needle was advanced into the abscess/fluid collection and a short  Amplatz super stiff wire was coiled within the collection. Appropriate positioning was confirmed with a limited CT scan. The tract was serially dilated allowing placement of a 10.2 [place] all-purpose drainage catheter. Appropriate positioning was confirmed with a limited postprocedural CT scan. Approximately 200 ml of viscous, dark brown fluid was aspirated. The tube was connected to a drainage bag and sutured in place. A dressing was placed. The patient tolerated the procedure well without immediate post procedural complication. IMPRESSION: Successful CT guided placement of a 10.2 [place] all purpose drain catheter into the right subcapsular fluid collection with aspiration of approximately 200 mL of viscous, dark brown fluid. Samples were sent to the laboratory as requested by the ordering clinical team. Ruthann Cancer, MD [facility] Inter[facility] Radiology Special[facility] Radiology Electronically Signed   By: Ruthann Cancer M.D.   On: 08/12/2022 07:48   DG Chest 2 View  Result Date: 08/11/2022 CLINICAL DATA:  81 year old male with respiratory failure and hypoxia. EXAM: CHEST - 2 VIEW COMPARISON:  CT Abdomen and Pelvis 08/09/2022. FINDINGS: Portable AP semi upright view at 1011 hours. Layering pleural effusions seen 2 days ago by CT, more apparent on the right. Mildly lower lung volumes since 08/01/2022. Normal cardiac size and mediastinal contours. Visualized tracheal air column is within normal limits. No pneumothorax. Increased pulmonary vascularity diffusely. No consolidation. No acute osseous abnormality identified. IMPRESSION: Ongoing pleural effusions more apparent on the right. Increased pulmonary vascular congestion since 08/01/2022 suspicious for mild interstitial edema. Electronically Signed   By: Genevie Ann M.D.   On: 08/11/2022 10:36   CT ABDOMEN W CONTRAST  Result Date: 08/09/2022 CLINICAL DATA:  Follow-up acute pancreatitis. EXAM: CT ABDOMEN WITH CONTRAST TECHNIQUE: Multidetector CT  imaging of the abdomen was performed using the standard protocol following bolus administration of intravenous contrast. RADIATION DOSE REDUCTION: This exam was performed according to the departmental dose-optimization program which includes automated exposure control, adjustment of the mA and/or kV according to patient size and/or use of iterative reconstruction technique. CONTRAST:  81m OMNIPAQUE IOHEXOL 300 MG/ML  SOLN COMPARISON:  08/02/2022 FINDINGS: Lower chest: Moderate bilateral pleural effusions and bilateral lower lobe atelectasis have increased since prior exam. Hepatobiliary: No hepatic masses identified. Prior cholecystectomy. No evidence of biliary obstruction. Increased size of large multiloculated fluid collections with peripheral rim enhancement are seen in the right and left perihepatic spaces, consistent with enlarging pseudocysts. Pancreas: Inflammatory changes are again seen surrounding the pancreatic head, with increased adjacent fluid collection showing peripheral rim enhancement which measures 5.5 x 5.2 cm on image 38/3. This is consistent with a developing pseudocyst. A few adjacent extraluminal air bubbles are again noted. No evidence of free intraperitoneal air. Spleen:  Within normal limits in size and appearance. Adrenals/Urinary Tract: No masses identified. Stable small right renal cyst (no followup imaging recommended) . No evidence of hydronephrosis. Stomach/Bowel: Unremarkable. Vascular/Lymphatic: No pathologically enlarged lymph nodes identified. Stable 3.9 cm infrarenal abdominal aortic aneurysm. Other:  None. Musculoskeletal:  No suspicious bone lesions identified. 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 Tiny extraluminal air bubbles are again noted adjacent to the pancreatic head and duodenum. Increased moderate bilateral pleural effusions and bilateral lower lobe atelectasis. Stable 3.9 cm infrarenal  abdominal aortic aneurysm. Recommend follow-up every 2 years. This recommendation follows ACR consensus guidelines: White Paper of the ACR Incidental Findings Committee II on Vascular Findings. J Am Coll Radiol 2013; 10:789-794. Electronically Signed   By: Marlaine Hind M.D.   On: 08/09/2022 14:39    Micro Results    No results found for this or any previous visit (from the past 240 hour(s)).  Today   Subjective    Douglas Hurst today has no headache,no chest abdominal pain,no new weakness tingling or numbness, feels much better wants to go home today.    Objective   Blood pressure (!) 118/49, pulse 77, temperature 97.8 F (36.6 C), temperature source Oral, resp. rate 19, height '6\' 1"'$  (1.854 m), weight 78.9 kg, SpO2 97 %.   Intake/Output Summary (Last 24 hours) at 09/08/2022 0919 Last data filed at 09/08/2022 0555 Gross per 24 hour  Intake --  Output 1305 ml  Net -1305 ml    Exam  Awake Alert, No new F.N deficits,    [place].AT,PERRAL Supple Neck,   Symmetrical Chest wall movement, Good air movement bilaterally, CTAB RRR,No Gallops,   +ve B.Sounds, Abd Soft, Non tender, cholecystostomy drain in place No Cyanosis, Clubbing or edema    Data Review   Recent Labs  Lab 09/06/22 0232  WBC 7.0  HGB 11.1*  HCT 33.8*  PLT 251  MCV 90.4  MCH 29.7  MCHC 32.8  RDW 16.1*    Recent Labs  Lab 09/06/22 0232  NA 135  K 4.2  CL 101  CO2 27  GLUCOSE 97  BUN 25*  CREATININE 1.06  CALCIUM 9.1  MG 1.8    Total Time in preparing paper work, data evaluation and todays exam - 16 minutes  Lala Lund M.D on 09/08/2022 at 9:19 AM  [facility]

## 2022-09-08 NOTE — TOC Transition Note (Signed)
Transition of Care [facility]) - CM/SW Discharge Note   Patient Details  Name: Douglas Hurst. MRN: [id number] Date of Birth: Jan 16, 1941  Transition of Care [facility]) CM/SW Contact:  Benard Halsted, LCSW Phone Number: 09/08/2022, 10:54 AM   Clinical Narrative:    Patient will DC to: [facility] Anticipated DC date: 09/08/22 Family notified: Spouse Transport by: Corey Harold   Per MD patient ready for DC to [facility]. RN to call report prior to discharge [phone] room 3217). RN, patient, patient's family, and facility notified of DC. Discharge Summary and FL2 sent to facility. DC packet on chart. Ambulance transport requested for patient.   CSW will sign off for now as social work intervention is no longer needed. Please consult [place] again if new needs arise.     Final next level of care: Skilled Nursing Facility Barriers to Discharge: Barriers Resolved   Patient Goals and CMS Choice Patient states their goals for this hospitalization and ongoing recovery are:: Rehab CMS Medicare.gov Compare Post Acute Care list provided to:: Patient Choice offered to / list presented to : Patient  Discharge Placement PASRR number recieved: 09/07/22            Patient chooses bed at: [facility] Patient to be transferred to facility by: [place] Name of family member notified: Marlene-spouse Patient and family notified of of transfer: 09/08/22  Discharge Plan and Services In-house Referral: Clinical Social Work   Post Acute Care Choice: [place]                               Social Determinants of Health ([place]) Interventions     Readmission Risk Interventions     No data to display

## 2022-09-08 NOTE — Discharge Instructions (Addendum)
Follow with Primary MD Lavone Orn, MD in 7 days   Get CBC, CMP, magnesium-  checked next visit within 1 week by  SNF MD    Activity: As tolerated with Full fall precautions use walker/cane & assistance as needed  Disposition SNF  Diet: Dysphagia 3 diet with feeding assistance and aspiration precautions.  Check CBGs q. [place].  Special Instructions: If you have smoked or chewed Tobacco  in the last 2 yrs please stop smoking, stop any regular Alcohol  and or any Recreational drug use.  On your next visit with your primary care physician please Get Medicines reviewed and adjusted.  Please request your Prim.MD to go over all Hospital Tests and Procedure/Radiological results at the follow up, please get all Hospital records sent to your Prim MD by signing hospital release before you go home.  If you experience worsening of your admission symptoms, develop shortness of breath, life threatening emergency, suicidal or homicidal thoughts you must seek medical attention immediately by calling 911 or calling your MD immediately  if symptoms less severe.  You Must read complete instructions/literature along with all the possible adverse reactions/side effects for all the Medicines you take and that have been prescribed to you. Take any new Medicines after you have completely understood and accpet all the possible adverse reactions/side effects.

## 2022-09-08 NOTE — Progress Notes (Signed)
Occupational Therapy Treatment Patient Details Name: Douglas Hurst. MRN: [id number] DOB: [birth year] Today's Date: 09/08/2022   History of present illness Patient is a 81 yo male presenting to the ED with severe abdominal pain with radiation to right shoulder on 07/31/22. Patient had underwent ECRP for removal of stones from biliary and pancreatic duct on 8/3. Admitted with acute pancreatitis. Patient with cortrak and Perihepatic drain placement. PMH includes: bipolar d/o DMII, HTN, HLD, GERD, COPD ,  Afib ,diastolic heart failure, AAA with interim history of acute cholecystitis.   OT comments  Pt agreed to complete OOB activity at this time. Pt was able to complete bed mobility with supine to sitting with  increase in time and min guard. PT was able to sit at EOB prop sitting but fatigues quickly and agreeable to transfer to chair with min guard sit to stand transfer with RW. Pt then set up with meal and participated in eating. Pt currently with functional limitations due to the deficits listed below (see OT Problem List).  Pt will benefit from skilled OT to increase their safety and independence with ADL and functional mobility for ADL to facilitate discharge to venue listed below.    BP: supine 127/66 sitting 94/50  sitting following 3 mins 108/52 following transfer 103/51 (Ted hose donned in session)    Recommendations for follow up therapy are one component of a multi-disciplinary discharge planning process, led by the attending physician.  Recommendations may be updated based on patient status, additional functional criteria and insurance authorization.    Follow Up Recommendations  Skilled nursing-short term rehab (<3 hours/day)    Assistance Recommended at Discharge Frequent or constant Supervision/Assistance  Patient can return home with the following  Assist for transportation;Help with stairs or ramp for entrance;Direct supervision/assist for medications management;A lot of help  with walking and/or transfers;A lot of help with bathing/dressing/bathroom;Assistance with cooking/housework   Equipment Recommendations  Other (comment) (TBD at next level of care)    Recommendations for Other Services Rehab consult    Precautions / Restrictions Precautions Precautions: Fall Precaution Comments: RUQ drain, orthostatic hypotension Restrictions Weight Bearing Restrictions: No       Mobility Bed Mobility Overal bed mobility: Needs Assistance Bed Mobility: Supine to Sit Rolling: Min guard              Transfers Overall transfer level: Needs assistance Equipment used: Rolling walker (2 wheels) Transfers: Sit to/from Stand Sit to Stand: Min guard, From elevated surface                 Balance Overall balance assessment: Needs assistance Sitting-balance support: Feet supported, Bilateral upper extremity supported Sitting balance-Leahy Scale: Fair Sitting balance - Comments: used BUE to prop sit at EOB for about 4 mins   Standing balance support: Bilateral upper extremity supported Standing balance-Leahy Scale: Poor                             ADL either performed or assessed with clinical judgement   ADL Overall ADL's : Needs assistance/impaired Eating/Feeding: Set up;Sitting   Grooming: Oral care;Brushing hair;Set up;Sitting   Upper Body Bathing: Minimal assistance;Sitting               Toilet Transfer: Min guard;Minimal assistance;Rolling walker (2 wheels);Cueing for safety;Cueing for sequencing;Stand-pivot                  Extremity/Trunk Assessment Upper Extremity Assessment Upper Extremity Assessment: Generalized  weakness RUE Deficits / Details: Muscles Quick to fatigue BUEs RUE Sensation: WNL RUE Coordination: WNL LUE Deficits / Details: quickly fatigues LUE Sensation: WNL LUE Coordination: WNL   Lower Extremity Assessment Lower Extremity Assessment: Defer to PT evaluation        Vision        Perception     Praxis      Cognition Arousal/Alertness: Awake/alert Behavior During Therapy: Flat affect Overall Cognitive Status: Impaired/Different from baseline Area of Impairment: Safety/judgement, Awareness                   Current Attention Level: Selective   Following Commands: Follows one step commands with increased time Safety/Judgement: Decreased awareness of safety, Decreased awareness of deficits Awareness: Emergent Problem Solving: Requires verbal cues, Decreased initiation, Difficulty sequencing General Comments: requires cues on sequencing through tasks but noted to be more motivated today        Exercises      Shoulder Instructions       General Comments BP: supine 127/66 sitting 94/50  sitting following 3 mins 108/52 following transfer 103/51    Pertinent Vitals/ Pain       Pain Assessment Pain Assessment: Faces Faces Pain Scale: Hurts a little bit Breathing: normal Negative Vocalization: none Facial Expression: smiling or inexpressive Body Language: relaxed Consolability: no need to console PAINAD Score: 0 Pain Location: right flank/drain site Pain Descriptors / Indicators: Discomfort Pain Intervention(s): Limited activity within patient's tolerance, Monitored during session, Repositioned  Home Living                                          Prior Functioning/Environment              Frequency  Min 2X/week        Progress Toward Goals  OT Goals(current goals can now be found in the care plan section)  Progress towards OT goals: Progressing toward goals  Acute Rehab OT Goals Patient Stated Goal: to get stronger OT Goal Formulation: With patient Time For Goal Achievement: 09/01/22 Potential to Achieve Goals: Good ADL Goals Pt Will Perform Grooming: with min guard assist;standing Pt Will Perform Lower Body Bathing: with min assist;sit to/from stand Pt Will Perform Lower Body Dressing: with min guard  assist;sit to/from stand Pt Will Transfer to Toilet: with min guard assist;ambulating;bedside commode Pt Will Perform Toileting - Clothing Manipulation and hygiene: with modified independence;sit to/from stand Pt/caregiver will Perform Home Exercise Program: Both right and left upper extremity;Increased strength;With theraband;With written HEP provided Additional ADL Goal #1: Patient will demonstrate increased activity tolerance to complete functional task in standing for 3-5 minutes without need for seated rest break.  Plan Discharge plan remains appropriate;Frequency remains appropriate    Co-evaluation                 AM-PAC OT "6 Clicks" Daily Activity     Outcome Measure   Help from another person eating meals?: A Little Help from another person taking care of personal grooming?: A Little Help from another person toileting, which includes using toliet, bedpan, or urinal?: Total Help from another person bathing (including washing, rinsing, drying)?: A Lot Help from another person to put on and taking off regular upper body clothing?: A Little Help from another person to put on and taking off regular lower body clothing?: A Lot 6 Click Score: 14  End of Session Equipment Utilized During Treatment: Rolling walker (2 wheels);Gait belt  OT Visit Diagnosis: Unsteadiness on feet (R26.81);Muscle weakness (generalized) (M62.81);Other symptoms and signs involving cognitive function;Pain Pain - Right/Left: Right Pain - part of body:  (abdomen)   Activity Tolerance Patient tolerated treatment well   Patient Left in chair;with call bell/phone within reach;with chair alarm set   Nurse Communication  (leads)        Time: 5051-8335 OT Time Calculation (min): 23 min  Charges: OT General Charges $OT Visit: 1 Visit OT Treatments $Self Care/Home Management : 23-37 mins  Joeseph Amor OTR/L  [place]  [phone] office number [phone] pager number   Joeseph Amor 09/08/2022, 11:52 AM

## 2022-09-09 DIAGNOSIS — I5020 Unspecified systolic (congestive) heart failure: Secondary | ICD-10-CM | POA: Diagnosis not present

## 2022-09-09 DIAGNOSIS — E43 Unspecified severe protein-calorie malnutrition: Secondary | ICD-10-CM | POA: Diagnosis not present

## 2022-09-09 DIAGNOSIS — E1142 Type 2 diabetes mellitus with diabetic polyneuropathy: Secondary | ICD-10-CM | POA: Diagnosis not present

## 2022-09-09 DIAGNOSIS — I10 Essential (primary) hypertension: Secondary | ICD-10-CM | POA: Diagnosis not present

## 2022-09-09 DIAGNOSIS — J449 Chronic obstructive pulmonary disease, unspecified: Secondary | ICD-10-CM | POA: Diagnosis not present

## 2022-09-09 DIAGNOSIS — I480 Paroxysmal atrial fibrillation: Secondary | ICD-10-CM | POA: Diagnosis not present

## 2022-09-09 DIAGNOSIS — E782 Mixed hyperlipidemia: Secondary | ICD-10-CM | POA: Diagnosis not present

## 2022-09-09 DIAGNOSIS — I714 Abdominal aortic aneurysm, without rupture, unspecified: Secondary | ICD-10-CM | POA: Diagnosis not present

## 2022-09-09 DIAGNOSIS — D649 Anemia, unspecified: Secondary | ICD-10-CM | POA: Diagnosis not present

## 2022-09-10 ENCOUNTER — Other Ambulatory Visit: Payer: Self-pay | Admitting: Internal Medicine

## 2022-09-10 ENCOUNTER — Other Ambulatory Visit: Payer: Self-pay | Admitting: Gastroenterology

## 2022-09-10 DIAGNOSIS — K750 Abscess of liver: Secondary | ICD-10-CM

## 2022-09-10 DIAGNOSIS — K9189 Other postprocedural complications and disorders of digestive system: Secondary | ICD-10-CM

## 2022-09-11 DIAGNOSIS — N179 Acute kidney failure, unspecified: Secondary | ICD-10-CM | POA: Diagnosis not present

## 2022-09-11 DIAGNOSIS — Z9049 Acquired absence of other specified parts of digestive tract: Secondary | ICD-10-CM | POA: Diagnosis not present

## 2022-09-11 DIAGNOSIS — K859 Acute pancreatitis without necrosis or infection, unspecified: Secondary | ICD-10-CM | POA: Diagnosis not present

## 2022-09-11 DIAGNOSIS — I5033 Acute on chronic diastolic (congestive) heart failure: Secondary | ICD-10-CM | POA: Diagnosis not present

## 2022-09-16 ENCOUNTER — Other Ambulatory Visit: Payer: Self-pay | Admitting: Gastroenterology

## 2022-09-16 ENCOUNTER — Ambulatory Visit
Admission: RE | Admit: 2022-09-16 | Discharge: 2022-09-16 | Disposition: A | Discharge status: Home / Self Care | Payer: Medicare HMO | Source: Ambulatory Visit | Attending: Internal Medicine | Admitting: Internal Medicine

## 2022-09-16 ENCOUNTER — Ambulatory Visit
Admission: RE | Admit: 2022-09-16 | Discharge: 2022-09-16 | Disposition: A | Discharge status: Home / Self Care | Payer: Medicare HMO | Source: Ambulatory Visit | Attending: Radiology | Admitting: Radiology

## 2022-09-16 DIAGNOSIS — K859 Acute pancreatitis without necrosis or infection, unspecified: Secondary | ICD-10-CM

## 2022-09-16 DIAGNOSIS — I714 Abdominal aortic aneurysm, without rupture, unspecified: Secondary | ICD-10-CM | POA: Diagnosis not present

## 2022-09-16 DIAGNOSIS — Z4682 Encounter for fitting and adjustment of non-vascular catheter: Secondary | ICD-10-CM | POA: Diagnosis not present

## 2022-09-16 DIAGNOSIS — K750 Abscess of liver: Secondary | ICD-10-CM

## 2022-09-16 DIAGNOSIS — Z8719 Personal history of other diseases of the digestive system: Secondary | ICD-10-CM | POA: Diagnosis not present

## 2022-09-16 DIAGNOSIS — N281 Cyst of kidney, acquired: Secondary | ICD-10-CM | POA: Diagnosis not present

## 2022-09-16 DIAGNOSIS — I2510 Atherosclerotic heart disease of native coronary artery without angina pectoris: Secondary | ICD-10-CM | POA: Diagnosis not present

## 2022-09-16 DIAGNOSIS — I700 Atherosclerosis of aorta: Secondary | ICD-10-CM | POA: Diagnosis not present

## 2022-09-16 HISTORY — PX: IR RADIOLOGIST EVAL & MGMT: IMG5224

## 2022-09-16 MED ORDER — IOPAMIDOL (ISOVUE-300) INJECTION 61%
100.0000 mL | Freq: Once | INTRAVENOUS | Status: AC | PRN
  Administered 2022-09-16: 100 mL via INTRAVENOUS

## 2022-09-16 NOTE — Progress Notes (Signed)
Referring Physician(s): Dr Jonelle Sports   Chief Complaint: The patient is seen in follow up today s/p perihepatic abscess drain placed in IR 8/15; upsized and repositioned 8/24.  History of present illness:  Scheduled today for CT and evaluation of drain. Pt living at [facility] center at this time. [place] daily per notes No antibiotics Op minimal--- 20 cc in 24 hr per note Pt denies pain; fever; chills Denies N/V Does not know of follow up with Dr Therisa Doyne His brother is with him today    Past Medical History:  Diagnosis Date   Bipolar disorder ([place])    Chronic back pain    Complication of anesthesia    prolonged sedation and confusion   COPD (chronic obstructive pulmonary disease) ([place])    Dr. Laurann Montana   Diabetes mellitus without complication ([place])    type 2   Diverticulosis    Dysrhythmia    Afib  onset 03-2022   ED (erectile dysfunction)    Gallstones    GERD (gastroesophageal reflux disease)    Hiatal hernia    Hyperlipidemia    Hypertension    Wears dentures    upper   Wears glasses    Wears hearing aid in both ears     Past Surgical History:  Procedure Laterality Date   BALLOON DILATION N/A 10/01/2015   Procedure: BALLOON DILATION;  Surgeon: Garlan Fair, MD;  Location: [facility];  Service: Endoscopy;  Laterality: N/A;   CATARACT EXTRACTION, BILATERAL     CHOLECYSTECTOMY N/A 07/08/2022   Procedure: LAPAROSCOPIC CHOLECYSTECTOMY WITH INTRAOPERATIVE CHOLANGIOGRAM AND LAPAROSCOPIC COMMON DUCT EXPLORATION;  Surgeon: Felicie Morn, MD;  Location: [facility];  Service: General;  Laterality: N/A;   COLONOSCOPY     removed polyps   COLONOSCOPY WITH PROPOFOL N/A 09/01/2016   Procedure: COLONOSCOPY WITH PROPOFOL;  Surgeon: Garlan Fair, MD;  Location: [facility];  Service: Endoscopy;  Laterality: N/A;   ERCP N/A 07/09/2022   Procedure: ENDOSCOPIC RETROGRADE CHOLANGIOPANCREATOGRAPHY (ERCP);  Surgeon: Clarene Essex, MD;  Location: [facility];   Service: Gastroenterology;  Laterality: N/A;   ESOPHAGEAL DILATION  06/23/2022   Procedure: PYLORIC  DILATION;  Surgeon: Clarene Essex, MD;  Location: [facility];  Service: Gastroenterology;;   ESOPHAGOGASTRODUODENOSCOPY N/A 06/23/2022   Procedure: ESOPHAGOGASTRODUODENOSCOPY (EGD);  Surgeon: Clarene Essex, MD;  Location: [facility];  Service: Gastroenterology;  Laterality: N/A;   ESOPHAGOGASTRODUODENOSCOPY (EGD) WITH PROPOFOL N/A 10/01/2015   Procedure: ESOPHAGOGASTRODUODENOSCOPY (EGD) WITH PROPOFOL;  Surgeon: Garlan Fair, MD;  Location: [facility];  Service: Endoscopy;  Laterality: N/A;   FISTULOTOMY  07/09/2022   Procedure: FISTULOTOMY;  Surgeon: Clarene Essex, MD;  Location: [facility];  Service: Gastroenterology;;   HEMOSTASIS CLIP PLACEMENT  06/23/2022   Procedure: HEMOSTASIS CLIP PLACEMENT;  Surgeon: Clarene Essex, MD;  Location: [facility];  Service: Gastroenterology;;   IR CATHETER TUBE CHANGE  08/20/2022   IR EXCHANGE BILIARY DRAIN  05/15/2022   IR EXCHANGE BILIARY DRAIN  05/26/2022   IR EXCHANGE BILIARY DRAIN  06/11/2022   IR EXCHANGE BILIARY DRAIN  06/23/2022   IR PERC CHOLECYSTOSTOMY  04/26/2022   KNEE ARTHROSCOPY     TONSILLECTOMY      Allergies: Patient has no known allergies.  Medications: Prior to Admission medications   Medication Sig Start Date End Date Taking? Authorizing Provider  acetaminophen (TYLENOL) 325 MG tablet Take 2 tablets (650 mg total) by mouth every 6 (six) hours as needed for mild pain (or Fever >/= 101). 04/30/22   Raiford Noble  Latif, DO  amiodarone (PACERONE) 200 MG tablet Take 1 tablet (200 mg total) by mouth daily. 05/29/22   Loel Dubonnet, NP  apixaban (ELIQUIS) 5 MG TABS tablet Take 1 tablet (5 mg total) by mouth 2 (two) times daily. Patient not taking: Reported on 07/31/2022 05/29/22   Loel Dubonnet, NP  atorvastatin (LIPITOR) 40 MG tablet Take 40 mg by mouth every evening.    [provider]  fentaNYL (DURAGESIC) 12 MCG/HR Place 1 patch  onto the skin every 3 (three) days. 09/09/22   Thurnell Lose, MD  insulin aspart (NOVOLOG) 100 UNIT/ML FlexPen Before each meal 3 times a day, 140-199 - 2 units, 200-250 - 4 units, 251-299 - 6 units,  300-349 - 8 units,  350 or above 10 units. Insulin PEN if approved, provide syringes and needles if needed.Please switch to any approved short acting Insulin if needed. 09/08/22   Thurnell Lose, MD  insulin detemir (LEVEMIR) 100 UNIT/ML injection Inject 0.08 mLs (8 Units total) into the skin at bedtime. 09/08/22   Thurnell Lose, MD  metFORMIN (GLUCOPHAGE-XR) 500 MG 24 hr tablet Take 500-1,000 mg by mouth See admin instructions. Take 2 tablets (1000 mg) by mouth in the morning and 1 tablet (500 mg) every evening 03/17/22   [provider]  metoprolol tartrate (LOPRESSOR) 25 MG tablet Take 1 tablet (25 mg total) by mouth 2 (two) times daily. 05/29/22   Loel Dubonnet, NP  Multiple Vitamin (MULTIVITAMIN WITH MINERALS) TABS tablet Take 1 tablet by mouth daily.    [provider]  ondansetron (ZOFRAN) 4 MG tablet Take 1 tablet (4 mg total) by mouth every 6 (six) hours as needed for nausea. 04/30/22   Sheikh, Omair Latif, DO  pantoprazole (PROTONIX) 40 MG tablet Take 40 mg by mouth every morning.    [provider]  polyethylene glycol powder (GLYCOLAX/MIRALAX) 17 GM/SCOOP powder Take 17 g by mouth daily. Patient taking differently: Take 17 g by mouth daily as needed for mild constipation or moderate constipation. 04/30/22   Sheikh, Omair Latif, DO  senna-docusate (SENOKOT-S) 8.6-50 MG tablet Take 1 tablet by mouth at bedtime. Patient taking differently: Take 1 tablet by mouth at bedtime as needed for mild constipation or moderate constipation. 04/30/22   Sheikh, Omair Latif, DO  SPIRIVA RESPIMAT 2.5 MCG/ACT AERS Inhale 2 puffs into the lungs daily as needed (COPD). 06/04/22   [provider]  tadalafil (CIALIS) 20 MG tablet Take 20 mg by mouth every three (3) days as needed  for erectile dysfunction. 03/29/22   [provider]  tamsulosin (FLOMAX) 0.4 MG CAPS capsule Take 0.4 mg by mouth daily after supper. 05/13/22   [provider]     Family History  Problem Relation Age of Onset   Heart attack Mother    Cancer - Colon Mother    CAD Mother    Cancer Mother    Dementia Mother    CAD Father    Cancer Father    CVA Father    Dementia Father    Heart attack Brother    CAD Brother     Social History   Socioeconomic History   Marital status: Married    Spouse name: Not on file   Number of children: Not on file   Years of education: Not on file   Highest education level: Not on file  Occupational History   Not on file  Tobacco Use   Smoking status: Former  Years: 35.00    Types: Cigarettes   Smokeless tobacco: Never  Vaping Use   Vaping Use: Never used  Substance and Sexual Activity   Alcohol use: No   Drug use: No   Sexual activity: Not Currently  Other Topics Concern   Not on file  Social History Narrative   Not on file   Social Determinants of Health   Financial Resource Strain: Not on file  Food Insecurity: Not on file  Transportation Needs: Not on file  Physical Activity: Not on file  Stress: Not on file  Social Connections: Not on file     Vital Signs: There were no vitals taken for this visit.  Physical Exam Skin:    General: Skin is warm.     Comments: Site is clean and dry NT no bleeding No sign of infection  CT  shows very small remaining collection; but catheter tip not in collection--has flipped into pleural space slightly per Dr Serafina Royals.  Drain removed without complication Dressing placed 1 hr CXR:  NO PTX per Dr Serafina Royals      Imaging: No results found.  Labs:  CBC: Recent Labs    08/28/22 0746 08/29/22 0538 08/30/22 0354 09/06/22 0232  WBC 15.0* 10.9* 6.8 7.0  HGB 10.5* 11.2* 10.6* 11.1*  HCT 32.1* 33.2* 31.5* 33.8*  PLT 277 254 276 251    COAGS: Recent Labs     04/26/22 0843 08/01/22 1832 08/02/22 0703 08/11/22 0941  INR 1.3*  --   --  1.1  APTT  --  121* 73*  --     BMP: Recent Labs    08/25/22 0250 08/27/22 0433 08/28/22 0746 09/06/22 0232  NA 137 137 135 135  K 4.5 4.4 4.4 4.2  CL 103 102 100 101  CO2 '27 29 27 27  '$ GLUCOSE 88 84 101* 97  BUN 25* 24* 23 25*  CALCIUM 9.9 9.9 9.7 9.1  CREATININE 1.07 1.33* 1.13 1.06  GFRNONAA >60 54* >60 >60    LIVER FUNCTION TESTS: Recent Labs    08/02/22 0703 08/03/22 0819 08/10/22 0747 08/27/22 0433  BILITOT 1.0 1.0 0.7 0.5  AST '19 18 17 19  '$ ALT '20 17 16 29  '$ ALKPHOS 72 64 73 72  PROT 5.5* 5.0* 5.4* 6.3*  ALBUMIN 2.3* 2.0* 1.8* 2.1*    Assessment:  Perihepatic abscess drain placed 8/15; upsized and repositioned 8/24 CT today revealing only sliver of collection remains Tip of catheter not in collection at this pont-- has flipped int pleural space Drain removed 1 hr CXR: No PTX per Dr Serafina Royals  Dr Serafina Royals would like pt to see Vascular surgery at this point regarding AAA that has grown over the years and is now meeting criteria for evaluation. I will message PCP Dr Electa Sniff and Cardiologist Dr Skeet Latch with this request for referral to [facility].  Signed: Lavonia Drafts, PA-C 09/16/2022, 2:12 PM   Please refer to Dr. Serafina Royals attestation of this note for management and plan.

## 2022-09-21 ENCOUNTER — Telehealth (HOSPITAL_BASED_OUTPATIENT_CLINIC_OR_DEPARTMENT_OTHER): Payer: Self-pay | Admitting: *Deleted

## 2022-09-21 DIAGNOSIS — I714 Abdominal aortic aneurysm, without rupture, unspecified: Secondary | ICD-10-CM

## 2022-09-21 NOTE — Telephone Encounter (Signed)
Referral placed.

## 2022-09-21 NOTE — Telephone Encounter (Signed)
-----   Message from Skeet Latch, MD sent at 09/19/2022  1:03 PM EDT ----- [name], can you please refer this patient to Vascular Surgery for aortic aneurysm?  TCR ----- Message ----- From: Lynnea Maizes Sent: 09/16/2022   3:53 PM EDT To: Skeet Latch, MD   Dr Hubbard Hartshorn,  We have seen Mr Douglas Hurst in [facility] today regarding perihepatic abscess drain. He has done well and I removed drain today. Incidentally, Dr Gretta Cool has seen a AAA that has grown over the years and now meets criteria for evaluation. Dr Serafina Royals is asking for pt to have a referral to Vascular Surgery for evaluation. Can you please make the referral? I have also written a note to Dr Electa Sniff the pts PCP with same request.  Thank you for help Let me know how we can help if need.  [facility]

## 2022-09-29 ENCOUNTER — Ambulatory Visit (INDEPENDENT_AMBULATORY_CARE_PROVIDER_SITE_OTHER): Payer: Medicare HMO | Admitting: Family

## 2022-09-29 ENCOUNTER — Encounter (HOSPITAL_BASED_OUTPATIENT_CLINIC_OR_DEPARTMENT_OTHER): Payer: Self-pay | Admitting: Family

## 2022-09-29 VITALS — BP 94/56 | HR 57 | Ht 73.0 in | Wt 157.0 lb

## 2022-09-29 DIAGNOSIS — Z79899 Other long term (current) drug therapy: Secondary | ICD-10-CM | POA: Diagnosis not present

## 2022-09-29 DIAGNOSIS — I7143 Infrarenal abdominal aortic aneurysm, without rupture: Secondary | ICD-10-CM

## 2022-09-29 DIAGNOSIS — D6859 Other primary thrombophilia: Secondary | ICD-10-CM | POA: Diagnosis not present

## 2022-09-29 DIAGNOSIS — I480 Paroxysmal atrial fibrillation: Secondary | ICD-10-CM | POA: Diagnosis not present

## 2022-09-29 DIAGNOSIS — I5022 Chronic systolic (congestive) heart failure: Secondary | ICD-10-CM | POA: Diagnosis not present

## 2022-09-29 MED ORDER — METOPROLOL TARTRATE 25 MG PO TABS: 12.5000 mg | ORAL_TABLET | Freq: Two times a day (BID) | ORAL | 3 refills | Status: DC

## 2022-09-29 NOTE — Patient Instructions (Addendum)
Medication Instructions:  Your physician has recommended you make the following change in your medication:   REDUCE Metoprolol Tartrate to 12.'5mg'$  (half tablet) twice daily  *If you need a refill on your cardiac medications before your next appointment, please call your pharmacy*  Follow-Up: At [facility], you and your health needs are our priority.  As part of our continuing mission to provide you with exceptional heart care, we have created designated Provider Care Teams.  These Care Teams include your primary Cardiologist (physician) and Advanced Practice Providers (APPs -  Physician Assistants and Nurse Practitioners) who all work together to provide you with the care you need, when you need it.  We recommend signing up for the patient portal called "MyChart".  Sign up information is provided on this After Visit Summary.  MyChart is used to connect with patients for Virtual Visits (Telemedicine).  Patients are able to view lab/test results, encounter notes, upcoming appointments, etc.  Non-urgent messages can be sent to your provider as well.   To learn more about what you can do with MyChart, go to [email or web address].    Your next appointment:   6 month(s)  The format for your next appointment:   In Person  Provider:   Skeet Latch, MD or Laurann Montana, NP    Other Instructions  Information About Your Aneurysm  One of your tests has shown an aneurysm of your abdominal aorta (CT 08/2022 4.0 cm). The word "aneurysm" refers to a bulge in an artery (blood vessel). Most people think of them in the context of an emergency, but yours was found incidentally. At this point there is nothing you need to do from a procedure standpoint, but there are some important things to keep in mind for day-to-day life.  Mainstays of therapy for aneurysms include very good blood pressure control, healthy lifestyle, and avoiding tobacco products and street drugs. Research has raised concern  that antibiotics in the fluoroquinolone class could be associated with increased risk of having an aneurysm develop or tear. This includes medicines that end in "floxacin," like Cipro or Levaquin. Make sure to discuss this information with other healthcare providers if you require antibiotics.  Since aneurysms can run in families, you should discuss your diagnosis with first degree relatives as they may need to be screened for this. Regular mild-moderate physical exercise is important, but avoid heavy lifting/weight lifting over 30lbs, chopping wood, shoveling snow or digging heavy earth with a shovel. It is best to avoid activities that cause grunting or straining (medically referred to as a "Valsalva maneuver"). This happens when a person bears down against a closed throat to increase the strength of arm or abdominal muscles. There's often a tendency to do this when lifting heavy weights, doing sit-ups, push-ups or chin-ups, etc., but it may be harmful.  This is a finding I would expect to be monitored periodically by your vascular surgery team. Most unruptured thoracic aortic aneurysms cause no symptoms, so they are often found during exams for other conditions. Contact a health care provider if you develop any discomfort in your upper back, neck, abdomen, trouble swallowing, cough or hoarseness, or unexplained weight loss. Get help right away if you develop severe pain in your upper back or abdomen that may move into your chest and arms, or any other concerning symptoms such as shortness of breath or fever.

## 2022-09-29 NOTE — Progress Notes (Signed)
Office Visit    Patient Name: Douglas Hurst. Date of Encounter: 09/29/2022  PCP:  Lavone Orn, [place]  Cardiologist:  Skeet Latch, MD  Advanced Practice Provider:  No care team member to display Electrophysiologist:  None   Chief Complaint    Douglas Hurst. is a 81 y.o. male with a hx of HTN, DM 2, hyperlipidemia, GERD, BPH, COPD, AAA, heart failure, PAF, cholecystitis presents today for PAF follow up.   Past Medical History    Past Medical History:  Diagnosis Date   Bipolar disorder ([place])    Chronic back pain    Complication of anesthesia    prolonged sedation and confusion   COPD (chronic obstructive pulmonary disease) (H[facility])    Dr. Laurann Montana   Diabetes mellitus without complication ([place])    type 2   Diverticulosis    Dysrhythmia    Afib  onset 03-2022   ED (erectile dysfunction)    Gallstones    GERD (gastroesophageal reflux disease)    Hiatal hernia    Hyperlipidemia    Hypertension    Wears dentures    upper   Wears glasses    Wears hearing aid in both ears    Past Surgical History:  Procedure Laterality Date   BALLOON DILATION N/A 10/01/2015   Procedure: BALLOON DILATION;  Surgeon: Garlan Fair, MD;  Location: [facility];  Service: Endoscopy;  Laterality: N/A;   CATARACT EXTRACTION, BILATERAL     CHOLECYSTECTOMY N/A 07/08/2022   Procedure: LAPAROSCOPIC CHOLECYSTECTOMY WITH INTRAOPERATIVE CHOLANGIOGRAM AND LAPAROSCOPIC COMMON DUCT EXPLORATION;  Surgeon: Felicie Morn, MD;  Location: [facility];  Service: General;  Laterality: N/A;   COLONOSCOPY     removed polyps   COLONOSCOPY WITH PROPOFOL N/A 09/01/2016   Procedure: COLONOSCOPY WITH PROPOFOL;  Surgeon: Garlan Fair, MD;  Location: [facility];  Service: Endoscopy;  Laterality: N/A;   ERCP N/A 07/09/2022   Procedure: ENDOSCOPIC RETROGRADE CHOLANGIOPANCREATOGRAPHY (ERCP);  Surgeon: Clarene Essex, MD;  Location: [facility];  Service: Gastroenterology;   Laterality: N/A;   ESOPHAGEAL DILATION  06/23/2022   Procedure: PYLORIC  DILATION;  Surgeon: Clarene Essex, MD;  Location: [facility];  Service: Gastroenterology;;   ESOPHAGOGASTRODUODENOSCOPY N/A 06/23/2022   Procedure: ESOPHAGOGASTRODUODENOSCOPY (EGD);  Surgeon: Clarene Essex, MD;  Location: [facility];  Service: Gastroenterology;  Laterality: N/A;   ESOPHAGOGASTRODUODENOSCOPY (EGD) WITH PROPOFOL N/A 10/01/2015   Procedure: ESOPHAGOGASTRODUODENOSCOPY (EGD) WITH PROPOFOL;  Surgeon: Garlan Fair, MD;  Location: [facility];  Service: Endoscopy;  Laterality: N/A;   FISTULOTOMY  07/09/2022   Procedure: FISTULOTOMY;  Surgeon: Clarene Essex, MD;  Location: [facility];  Service: Gastroenterology;;   HEMOSTASIS CLIP PLACEMENT  06/23/2022   Procedure: HEMOSTASIS CLIP PLACEMENT;  Surgeon: Clarene Essex, MD;  Location: [facility];  Service: Gastroenterology;;   IR CATHETER TUBE CHANGE  08/20/2022   IR EXCHANGE BILIARY DRAIN  05/15/2022   IR EXCHANGE BILIARY DRAIN  05/26/2022   IR EXCHANGE BILIARY DRAIN  06/11/2022   IR EXCHANGE BILIARY DRAIN  06/23/2022   IR PERC CHOLECYSTOSTOMY  04/26/2022   IR RADIOLOGIST EVAL & MGMT  09/16/2022   KNEE ARTHROSCOPY     TONSILLECTOMY      Allergies  No Known Allergies  History of Present Illness    Douglas Hurst. is a 81 y.o. male with a hx of HTN, DM 2, hyperlipidemia, GERD, BPH, COPD, AAA, heart failure, PAF, cholecystitis last seen 07/03/22  Presented to the  ED 04/25/2022 with RUQ pain diagnosed with acute cholecystitis with 1.2 mm gallstone in the neck of the gallbladder and 5 mm CBD stone with proximal biliary dilation.  Treated with IV fluid, Zosyn.  General surgery and GI recommended cholecystostomy tube for management.  Developed new onset atrial for with RVR with depressed LVEF 40-45%.  He had hypotension on metoprolol and was transition to amiodarone.  Subsequently converted to NSR.  He was discharged home 5/23.  Has since established with home health PT.  Repeat echocardiogram 05/12/2022 with LVEF 60 to 65%, no RWMA, grade 1 diastolic dysfunction, RV normal size and function, trivial MR/AI, mild dilation ascending aorta 40 mm  Seen 05/29/22 in follow up.  Doing well from cardiac perspective. TSH/CBC were updated and unremarkable. Still had cholecystostomy tube with plan for removal upcoming. Phone visit 07/03/22 with clearance provided for cholecystectomy.   07/08/22 with laparoscopic cholecystectomy with laparoscopic common duct exploration which failed to clear duct. Repeat ERCP unsuccessful. Referred to [facility] and underwent advanced endoscopy via transduodenal approach 07/30/22. However, admitted 07/30/22-09/08/22 with severe abdominal pain and found to have pancreatitis. Course complicated by atrial fibrillation with RVR, pseudocyst formation, subhepatic fluid collection requiring CT guided drain placement. Drain removed 09/16/22. 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   Pleasant gentleman who presents today for follow-up with his wife and brother. Presently at [facility] with plans to possibly return to home Friday. Working with PT and gradually increasing strength. Still with poor appetite, difficulty eating with upcoming evaluation by either speech therapy or GI. Reports no shortness of breath. Reports no chest pain, pressure, or tightness. No edema, orthopnea, PND. Reports no palpitations.    EKGs/Labs/Other Studies Reviewed:   The following studies were reviewed today:   Echo 05/12/22 IMPRESSIONS   1. Left ventricular ejection fraction, by estimation, is 60 to 65%. The  left ventricle has normal function. The left ventricle has no regional  wall motion abnormalities. Left ventricular diastolic parameters are  consistent with Grade I diastolic  dysfunction (impaired relaxation).   2. Right ventricular systolic function is normal. The right ventricular  size is normal.  There is normal pulmonary artery systolic pressure. The  estimated right ventricular systolic pressure is 38.1 mmHg.   3. Left atrial size was mildly dilated.   4. The mitral valve is abnormal. Trivial mitral valve regurgitation.   5. The aortic valve is tricuspid. Aortic valve regurgitation is trivial.  Aortic valve sclerosis is present, with no evidence of aortic valve  stenosis.   6. Aortic dilatation noted. There is mild dilatation of the ascending  aorta, measuring 40 mm.   7. The inferior vena cava is normal in size with greater than 50%  respiratory variability, suggesting right atrial pressure of 3 mmHg.   Comparison(s): Changes from prior study are noted. 4/60/2023: LVEF 40-45%.   Echo 04/26/22  1. Left ventricular ejection fraction, by estimation, is 40 to 45%. The  left ventricle has mildly decreased function. The left ventricle  demonstrates global hypokinesis. There is mild left ventricular  hypertrophy. Left ventricular diastolic parameters  are consistent with Grade I diastolic dysfunction (impaired relaxation).   2. Right ventricular systolic function is normal. The right ventricular  size is normal. There is normal pulmonary artery systolic pressure.   3. The mitral valve is abnormal. Mild mitral valve regurgitation. No  evidence of mitral stenosis. Moderate mitral annular calcification.   4. The tricuspid valve is  abnormal. Tricuspid valve regurgitation is mild  to moderate.   5. The aortic valve is tricuspid. There is moderate calcification of the  aortic valve. There is moderate thickening of the aortic valve. Aortic  valve regurgitation is not visualized. No aortic stenosis is present.   6. The inferior vena cava is dilated in size with >50% respiratory  variability, suggesting right atrial pressure of 8 mmHg.   EKG: No EKG today.   Recent Labs: 06/15/2022: TSH 0.849 08/27/2022: ALT 29 09/06/2022: BUN 25; Creatinine, Ser 1.06; Hemoglobin 11.1; Magnesium 1.8;  Platelets 251; Potassium 4.2; Sodium 135  Recent Lipid Panel No results found for: "CHOL", "TRIG", "HDL", "CHOLHDL", "VLDL", "LDLCALC", "LDLDIRECT"  Risk Assessment/Calculations:   CHA2DS2-VASc Score = 5   This indicates a 7.2% annual risk of stroke. The patient's score is based upon: CHF History: 1 HTN History: 1 Diabetes History: 1 Stroke History: 0 Vascular Disease History: 0 Age Score: 2 Gender Score: 0     Home Medications   Current Meds  Medication Sig   acetaminophen (TYLENOL) 325 MG tablet Take 2 tablets (650 mg total) by mouth every 6 (six) hours as needed for mild pain (or Fever >/= 101).   amiodarone (PACERONE) 200 MG tablet Take 1 tablet (200 mg total) by mouth daily.   apixaban (ELIQUIS) 5 MG TABS tablet Take 1 tablet (5 mg total) by mouth 2 (two) times daily.   atorvastatin (LIPITOR) 40 MG tablet Take 40 mg by mouth every evening.   fentaNYL (DURAGESIC) 12 MCG/HR Place 1 patch onto the skin every 3 (three) days.   insulin aspart (NOVOLOG) 100 UNIT/ML FlexPen Before each meal 3 times a day, 140-199 - 2 units, 200-250 - 4 units, 251-299 - 6 units,  300-349 - 8 units,  350 or above 10 units. Insulin PEN if approved, provide syringes and needles if needed.Please switch to any approved short acting Insulin if needed.   insulin detemir (LEVEMIR) 100 UNIT/ML injection Inject 0.08 mLs (8 Units total) into the skin at bedtime.   metFORMIN (GLUCOPHAGE-XR) 500 MG 24 hr tablet Take 500-1,000 mg by mouth See admin instructions. Take 2 tablets (1000 mg) by mouth in the morning and 1 tablet (500 mg) every evening   metoprolol tartrate (LOPRESSOR) 25 MG tablet Take 1 tablet (25 mg total) by mouth 2 (two) times daily.   Multiple Vitamin (MULTIVITAMIN WITH MINERALS) TABS tablet Take 1 tablet by mouth daily.   ondansetron (ZOFRAN) 4 MG tablet Take 1 tablet (4 mg total) by mouth every 6 (six) hours as needed for nausea.   pantoprazole (PROTONIX) 40 MG tablet Take 40 mg by mouth every  morning.   polyethylene glycol powder (GLYCOLAX/MIRALAX) 17 GM/SCOOP powder Take 17 g by mouth daily. (Patient taking differently: Take 17 g by mouth daily as needed for mild constipation or moderate constipation.)   senna-docusate (SENOKOT-S) 8.6-50 MG tablet Take 1 tablet by mouth at bedtime. (Patient taking differently: Take 1 tablet by mouth at bedtime as needed for mild constipation or moderate constipation.)   SPIRIVA RESPIMAT 2.5 MCG/ACT AERS Inhale 2 puffs into the lungs daily as needed (COPD).   tadalafil (CIALIS) 20 MG tablet Take 20 mg by mouth every three (3) days as needed for erectile dysfunction.   tamsulosin (FLOMAX) 0.4 MG CAPS capsule Take 0.4 mg by mouth daily after supper.    Review of Systems      All other systems reviewed and are otherwise negative except as noted above.  Physical Exam  VS:  BP (!) 94/56   Pulse (!) 57   Ht '6\' 1"'$  (1.854 m)   Wt 157 lb (71.2 kg)   BMI 20.71 kg/m  , BMI Body mass index is 20.71 kg/m.  Wt Readings from Last 3 Encounters:  09/29/22 157 lb (71.2 kg)  09/07/22 173 lb 15.1 oz (78.9 kg)  07/09/22 182 lb 1.6 oz (82.6 kg)    GEN: Well nourished, frail appearing.  in no acute distress. HEENT: normal. Neck: Supple, no JVD, carotid bruits, or masses. Cardiac: bradycardic, RRR, no murmurs, rubs, or gallops. No clubbing, cyanosis, edema.  Radials/PT 2+ and equal bilaterally.  Respiratory:  Respirations regular and unlabored, clear to auscultation bilaterally. GI: Soft, nontender, nondistended. MS: No deformity or atrophy. Skin: Warm and dry, no rash. Neuro:  Strength and sensation are intact. Psych: Normal affect.  Assessment & Plan    HFrEF - LVEF 40% in setting of sepsis with cholecystitis with new onset atrial fibrillation 03/2022. Repeat echo 04/2022 normal LVEF. Euvolemic and well compensated on exam. As LVEF recovered, no indication for ischemic evaluation.   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 Continue BP control. Consider  repeat imaging in 1 year via echo or CT.   AAA -continue optimal BP control.  05/2018 3.6 infrarenal abdominal aortic aneurysm recommended for follow-up ultrasound in 2-years.  08/2022 4.0 cm. Has appointment to establish with [facility] tomorrow. Continue optimal BP control, avoid fluoroquinolones.   PAF / On Amiodarone therapy / Hypercoagulable state -Maintaining NSR today. Denies palpitations.  Continue Eliquis 5 mg twice daily.  Denies bleeding complications.  Does not meet dose reduction criteria. CHA2DS2-VASc Score = 5 [CHF History: 1, HTN History: 1, Diabetes History: 1, Stroke History: 0, Vascular Disease History: 0, Age Score: 2, Gender Score: 0].  Therefore, the patient's annual risk of stroke is 7.2 %.    Given hypotension today, will reduce Metoprolol to 12.'5mg'$  BID.  Amiodarone monitoring : Continue annual eye exam.  06/15/22 TSH 0.84. 08/27/22 ALT 29, AST 19.    Disposition: Follow up in 6 month(s) with TSkeet Latch MD or APP.  Signed, CLoel Dubonnet NP 09/29/2022, 4:12 PM [facility]

## 2022-09-30 ENCOUNTER — Encounter: Payer: Self-pay | Admitting: Vascular Surgery

## 2022-09-30 ENCOUNTER — Ambulatory Visit (INDEPENDENT_AMBULATORY_CARE_PROVIDER_SITE_OTHER): Payer: Medicare HMO | Admitting: Vascular Surgery

## 2022-09-30 VITALS — BP 94/59 | HR 60 | Temp 97.7°F | Resp 20 | Ht 73.0 in | Wt 157.0 lb

## 2022-09-30 DIAGNOSIS — I7143 Infrarenal abdominal aortic aneurysm, without rupture: Secondary | ICD-10-CM | POA: Diagnosis not present

## 2022-09-30 NOTE — Progress Notes (Signed)
Patient ID: Douglas Hurst., male   DOB: 10-Apr-1941, 81 y.o.   MRN: [id number]  Reason for Consult: New Patient (Initial Visit)   Referred by Skeet Latch, MD  Subjective:     HPI:  Douglas Hurst. is a 81 y.o. male with recent history of pancreatitis found to have abdominal aortic aneurysm.  He has a significant family history of heart disease but no personal family history of aneurysm disease.  Present living in a nursing facility does have abdominal pain nothing that is acute and no new back pain.  He does walk with some limitation since being in the nursing facility currently in wheelchair.   Past Medical History:  Diagnosis Date   Bipolar disorder ([place])    Chronic back pain    Complication of anesthesia    prolonged sedation and confusion   COPD (chronic obstructive pulmonary disease) (H[facility])    Dr. Laurann Montana   Diabetes mellitus without complication ([place])    type 2   Diverticulosis    Dysrhythmia    Afib  onset 03-2022   ED (erectile dysfunction)    Gallstones    GERD (gastroesophageal reflux disease)    Hiatal hernia    Hyperlipidemia    Hypertension    Wears dentures    upper   Wears glasses    Wears hearing aid in both ears    Family History  Problem Relation Age of Onset   Heart attack Mother    Cancer - Colon Mother    CAD Mother    Cancer Mother    Dementia Mother    CAD Father    Cancer Father    CVA Father    Dementia Father    Heart attack Brother    CAD Brother    Past Surgical History:  Procedure Laterality Date   BALLOON DILATION N/A 10/01/2015   Procedure: BALLOON DILATION;  Surgeon: Garlan Fair, MD;  Location: [facility];  Service: Endoscopy;  Laterality: N/A;   CATARACT EXTRACTION, BILATERAL     CHOLECYSTECTOMY N/A 07/08/2022   Procedure: LAPAROSCOPIC CHOLECYSTECTOMY WITH INTRAOPERATIVE CHOLANGIOGRAM AND LAPAROSCOPIC COMMON DUCT EXPLORATION;  Surgeon: Felicie Morn, MD;  Location: [facility];  Service: General;  Laterality:  N/A;   COLONOSCOPY     removed polyps   COLONOSCOPY WITH PROPOFOL N/A 09/01/2016   Procedure: COLONOSCOPY WITH PROPOFOL;  Surgeon: Garlan Fair, MD;  Location: [facility];  Service: Endoscopy;  Laterality: N/A;   ERCP N/A 07/09/2022   Procedure: ENDOSCOPIC RETROGRADE CHOLANGIOPANCREATOGRAPHY (ERCP);  Surgeon: Clarene Essex, MD;  Location: [facility];  Service: Gastroenterology;  Laterality: N/A;   ESOPHAGEAL DILATION  06/23/2022   Procedure: PYLORIC  DILATION;  Surgeon: Clarene Essex, MD;  Location: [facility];  Service: Gastroenterology;;   ESOPHAGOGASTRODUODENOSCOPY N/A 06/23/2022   Procedure: ESOPHAGOGASTRODUODENOSCOPY (EGD);  Surgeon: Clarene Essex, MD;  Location: [facility];  Service: Gastroenterology;  Laterality: N/A;   ESOPHAGOGASTRODUODENOSCOPY (EGD) WITH PROPOFOL N/A 10/01/2015   Procedure: ESOPHAGOGASTRODUODENOSCOPY (EGD) WITH PROPOFOL;  Surgeon: Garlan Fair, MD;  Location: [facility];  Service: Endoscopy;  Laterality: N/A;   FISTULOTOMY  07/09/2022   Procedure: FISTULOTOMY;  Surgeon: Clarene Essex, MD;  Location: [facility];  Service: Gastroenterology;;   HEMOSTASIS CLIP PLACEMENT  06/23/2022   Procedure: HEMOSTASIS CLIP PLACEMENT;  Surgeon: Clarene Essex, MD;  Location: [facility];  Service: Gastroenterology;;   IR CATHETER TUBE CHANGE  08/20/2022   IR EXCHANGE BILIARY DRAIN  05/15/2022   IR EXCHANGE BILIARY DRAIN  05/26/2022  IR EXCHANGE BILIARY DRAIN  06/11/2022   IR EXCHANGE BILIARY DRAIN  06/23/2022   IR PERC CHOLECYSTOSTOMY  04/26/2022   IR RADIOLOGIST EVAL & MGMT  09/16/2022   KNEE ARTHROSCOPY     TONSILLECTOMY      Short Social History:  Social History   Tobacco Use   Smoking status: Former    Years: 35.00    Types: Cigarettes   Smokeless tobacco: Never  Substance Use Topics   Alcohol use: No    No Known Allergies  Current Outpatient Medications  Medication Sig Dispense Refill   acetaminophen (TYLENOL) 325 MG tablet Take 2 tablets (650 mg total) by mouth  every 6 (six) hours as needed for mild pain (or Fever >/= 101). 20 tablet 0   amiodarone (PACERONE) 200 MG tablet Take 1 tablet (200 mg total) by mouth daily. 90 tablet 3   apixaban (ELIQUIS) 5 MG TABS tablet Take 1 tablet (5 mg total) by mouth 2 (two) times daily. 180 tablet 3   atorvastatin (LIPITOR) 40 MG tablet Take 40 mg by mouth every evening.     fentaNYL (DURAGESIC) 12 MCG/HR Place 1 patch onto the skin every 3 (three) days. 1 patch 0   insulin aspart (NOVOLOG) 100 UNIT/ML FlexPen Before each meal 3 times a day, 140-199 - 2 units, 200-250 - 4 units, 251-299 - 6 units,  300-349 - 8 units,  350 or above 10 units. Insulin PEN if approved, provide syringes and needles if needed.Please switch to any approved short acting Insulin if needed. 15 mL 0   insulin detemir (LEVEMIR) 100 UNIT/ML injection Inject 0.08 mLs (8 Units total) into the skin at bedtime. 10 mL 0   metFORMIN (GLUCOPHAGE-XR) 500 MG 24 hr tablet Take 500-1,000 mg by mouth See admin instructions. Take 2 tablets (1000 mg) by mouth in the morning and 1 tablet (500 mg) every evening     metoprolol tartrate (LOPRESSOR) 25 MG tablet Take 0.5 tablets (12.5 mg total) by mouth 2 (two) times daily. 90 tablet 3   Multiple Vitamin (MULTIVITAMIN WITH MINERALS) TABS tablet Take 1 tablet by mouth daily.     ondansetron (ZOFRAN) 4 MG tablet Take 1 tablet (4 mg total) by mouth every 6 (six) hours as needed for nausea. 20 tablet 0   pantoprazole (PROTONIX) 40 MG tablet Take 40 mg by mouth every morning.     polyethylene glycol powder (GLYCOLAX/MIRALAX) 17 GM/SCOOP powder Take 17 g by mouth daily. (Patient taking differently: Take 17 g by mouth daily as needed for mild constipation or moderate constipation.) 238 g 0   senna-docusate (SENOKOT-S) 8.6-50 MG tablet Take 1 tablet by mouth at bedtime. (Patient taking differently: Take 1 tablet by mouth at bedtime as needed for mild constipation or moderate constipation.) 30 tablet 0   SPIRIVA RESPIMAT 2.5  MCG/ACT AERS Inhale 2 puffs into the lungs daily as needed (COPD).     tadalafil (CIALIS) 20 MG tablet Take 20 mg by mouth every three (3) days as needed for erectile dysfunction.     tamsulosin (FLOMAX) 0.4 MG CAPS capsule Take 0.4 mg by mouth daily after supper.     No current facility-administered medications for this visit.    Review of Systems  Constitutional:  Constitutional negative. HENT: HENT negative.  Eyes: Eyes negative.  Respiratory: Respiratory negative.  Cardiovascular: Cardiovascular negative.  GI: Gastrointestinal negative.  Musculoskeletal: Musculoskeletal negative.  Skin: Skin negative.  Neurological: Neurological negative. Hematologic: Hematologic/lymphatic negative.  Psychiatric: Psychiatric negative.  Objective:  Objective   Vitals:   09/30/22 1134  BP: (!) 94/59  Pulse: 60  Resp: 20  Temp: 97.7 F (36.5 C)  SpO2: 95%      Physical Exam HENT:     Head: Normocephalic.     Nose: Nose normal.  Eyes:     Pupils: Pupils are equal, round, and reactive to light.  Neck:     Vascular: No carotid bruit.  Cardiovascular:     Rate and Rhythm: Normal rate.  Pulmonary:     Effort: Pulmonary effort is normal.  Abdominal:     General: Abdomen is flat.     Palpations: Abdomen is soft.  Musculoskeletal:     Right lower leg: No edema.     Left lower leg: No edema.  Skin:    General: Skin is warm and dry.     Capillary Refill: Capillary refill takes less than 2 seconds.  Neurological:     General: No focal deficit present.     Mental Status: He is alert. Mental status is at baseline.  Psychiatric:        Mood and Affect: Mood normal.        Behavior: Behavior normal.        Thought Content: Thought content normal.        Judgment: Judgment normal.    Data: CT IMPRESSION: 1. Interval migration of previously visualized hepatic fluid collection drainage catheter to the pleural space in right costophrenic angle. 2. Continued interval  decreased size of previously visualized right subhepatic fluid collection. 3. Near complete resolution of previously visualized trace right pleural effusion and right basilar subsegmental atelectasis. 4. Continued resolution of peripancreatic fat stranding compatible with healing pancreatitis. 5. Unchanged 4.0 cm fusiform infrarenal abdominal aortic aneurysm. Recommend follow-up every 12 months and vascular consultation. This recommendation follows ACR consensus guidelines: White Paper of the ACR Incidental Findings Committee II on Vascular Findings. J Am Coll Radiol 2013; 10:789-794. 6. Prostatomegaly.     Assessment/Plan:     81 year old male with small abdominal aortic aneurysm measuring 4 cm currently in a nursing facility recovering from pancreatitis.  Plan will be to follow-up in 1 year with abdominal aortic and duplex.  We discussed the low risk of rupture but that is not 0% risk of rupture we discussed the signs and symptoms of back and abdominal pain for which to seek emergent medical evaluation.  We also discussed the need to notify the next of kin of aneurysm history and they demonstrate good understanding.     Waynetta Sandy MD [facility]

## 2022-10-01 DIAGNOSIS — E1142 Type 2 diabetes mellitus with diabetic polyneuropathy: Secondary | ICD-10-CM | POA: Diagnosis not present

## 2022-10-01 DIAGNOSIS — I480 Paroxysmal atrial fibrillation: Secondary | ICD-10-CM | POA: Diagnosis not present

## 2022-10-01 DIAGNOSIS — I5020 Unspecified systolic (congestive) heart failure: Secondary | ICD-10-CM | POA: Diagnosis not present

## 2022-10-01 DIAGNOSIS — J449 Chronic obstructive pulmonary disease, unspecified: Secondary | ICD-10-CM | POA: Diagnosis not present

## 2022-10-01 DIAGNOSIS — I10 Essential (primary) hypertension: Secondary | ICD-10-CM | POA: Diagnosis not present

## 2022-10-05 NOTE — Telephone Encounter (Signed)
Was seen by Dr Donzetta Matters 10/4

## 2022-10-07 DIAGNOSIS — K219 Gastro-esophageal reflux disease without esophagitis: Secondary | ICD-10-CM | POA: Diagnosis not present

## 2022-10-07 DIAGNOSIS — R931 Abnormal findings on diagnostic imaging of heart and coronary circulation: Secondary | ICD-10-CM | POA: Diagnosis not present

## 2022-10-07 DIAGNOSIS — E1151 Type 2 diabetes mellitus with diabetic peripheral angiopathy without gangrene: Secondary | ICD-10-CM | POA: Diagnosis not present

## 2022-10-07 DIAGNOSIS — N5201 Erectile dysfunction due to arterial insufficiency: Secondary | ICD-10-CM | POA: Diagnosis not present

## 2022-10-07 DIAGNOSIS — I10 Essential (primary) hypertension: Secondary | ICD-10-CM | POA: Diagnosis not present

## 2022-10-07 DIAGNOSIS — I714 Abdominal aortic aneurysm, without rupture, unspecified: Secondary | ICD-10-CM | POA: Diagnosis not present

## 2022-10-07 DIAGNOSIS — I4891 Unspecified atrial fibrillation: Secondary | ICD-10-CM | POA: Diagnosis not present

## 2022-10-07 DIAGNOSIS — Z23 Encounter for immunization: Secondary | ICD-10-CM | POA: Diagnosis not present

## 2022-10-09 DIAGNOSIS — R1310 Dysphagia, unspecified: Secondary | ICD-10-CM | POA: Diagnosis not present

## 2022-10-22 DIAGNOSIS — I10 Essential (primary) hypertension: Secondary | ICD-10-CM | POA: Diagnosis not present

## 2022-10-22 DIAGNOSIS — E1151 Type 2 diabetes mellitus with diabetic peripheral angiopathy without gangrene: Secondary | ICD-10-CM | POA: Diagnosis not present

## 2022-10-22 DIAGNOSIS — K219 Gastro-esophageal reflux disease without esophagitis: Secondary | ICD-10-CM | POA: Diagnosis not present

## 2022-10-22 DIAGNOSIS — N400 Enlarged prostate without lower urinary tract symptoms: Secondary | ICD-10-CM | POA: Diagnosis not present

## 2022-10-22 DIAGNOSIS — E782 Mixed hyperlipidemia: Secondary | ICD-10-CM | POA: Diagnosis not present

## 2023-01-04 DIAGNOSIS — K219 Gastro-esophageal reflux disease without esophagitis: Secondary | ICD-10-CM | POA: Diagnosis not present

## 2023-01-04 DIAGNOSIS — D649 Anemia, unspecified: Secondary | ICD-10-CM | POA: Diagnosis not present

## 2023-01-04 DIAGNOSIS — Z79899 Other long term (current) drug therapy: Secondary | ICD-10-CM | POA: Diagnosis not present

## 2023-01-04 DIAGNOSIS — I480 Paroxysmal atrial fibrillation: Secondary | ICD-10-CM | POA: Diagnosis not present

## 2023-01-04 DIAGNOSIS — J449 Chronic obstructive pulmonary disease, unspecified: Secondary | ICD-10-CM | POA: Diagnosis not present

## 2023-01-04 DIAGNOSIS — R251 Tremor, unspecified: Secondary | ICD-10-CM | POA: Diagnosis not present

## 2023-01-04 DIAGNOSIS — E1140 Type 2 diabetes mellitus with diabetic neuropathy, unspecified: Secondary | ICD-10-CM | POA: Diagnosis not present

## 2023-01-04 DIAGNOSIS — R5381 Other malaise: Secondary | ICD-10-CM | POA: Diagnosis not present

## 2023-01-04 DIAGNOSIS — R5383 Other fatigue: Secondary | ICD-10-CM | POA: Diagnosis not present

## 2023-01-04 DIAGNOSIS — I10 Essential (primary) hypertension: Secondary | ICD-10-CM | POA: Diagnosis not present

## 2023-01-08 DIAGNOSIS — R2689 Other abnormalities of gait and mobility: Secondary | ICD-10-CM | POA: Diagnosis not present

## 2023-01-08 DIAGNOSIS — M6281 Muscle weakness (generalized): Secondary | ICD-10-CM | POA: Diagnosis not present

## 2023-01-14 DIAGNOSIS — M6281 Muscle weakness (generalized): Secondary | ICD-10-CM | POA: Diagnosis not present

## 2023-01-14 DIAGNOSIS — R2689 Other abnormalities of gait and mobility: Secondary | ICD-10-CM | POA: Diagnosis not present

## 2023-01-18 DIAGNOSIS — M6281 Muscle weakness (generalized): Secondary | ICD-10-CM | POA: Diagnosis not present

## 2023-01-18 DIAGNOSIS — R2689 Other abnormalities of gait and mobility: Secondary | ICD-10-CM | POA: Diagnosis not present

## 2023-01-25 DIAGNOSIS — K219 Gastro-esophageal reflux disease without esophagitis: Secondary | ICD-10-CM | POA: Diagnosis not present

## 2023-01-25 DIAGNOSIS — R2689 Other abnormalities of gait and mobility: Secondary | ICD-10-CM | POA: Diagnosis not present

## 2023-01-25 DIAGNOSIS — E1151 Type 2 diabetes mellitus with diabetic peripheral angiopathy without gangrene: Secondary | ICD-10-CM | POA: Diagnosis not present

## 2023-01-25 DIAGNOSIS — I480 Paroxysmal atrial fibrillation: Secondary | ICD-10-CM | POA: Diagnosis not present

## 2023-01-25 DIAGNOSIS — N400 Enlarged prostate without lower urinary tract symptoms: Secondary | ICD-10-CM | POA: Diagnosis not present

## 2023-01-25 DIAGNOSIS — M6281 Muscle weakness (generalized): Secondary | ICD-10-CM | POA: Diagnosis not present

## 2023-01-25 DIAGNOSIS — I10 Essential (primary) hypertension: Secondary | ICD-10-CM | POA: Diagnosis not present

## 2023-01-25 DIAGNOSIS — E782 Mixed hyperlipidemia: Secondary | ICD-10-CM | POA: Diagnosis not present

## 2023-01-27 DIAGNOSIS — M6281 Muscle weakness (generalized): Secondary | ICD-10-CM | POA: Diagnosis not present

## 2023-01-27 DIAGNOSIS — R2689 Other abnormalities of gait and mobility: Secondary | ICD-10-CM | POA: Diagnosis not present

## 2023-01-28 DIAGNOSIS — R3911 Hesitancy of micturition: Secondary | ICD-10-CM | POA: Diagnosis not present

## 2023-01-28 DIAGNOSIS — R3915 Urgency of urination: Secondary | ICD-10-CM | POA: Diagnosis not present

## 2023-01-28 DIAGNOSIS — R351 Nocturia: Secondary | ICD-10-CM | POA: Diagnosis not present

## 2023-01-28 DIAGNOSIS — R350 Frequency of micturition: Secondary | ICD-10-CM | POA: Diagnosis not present

## 2023-01-28 DIAGNOSIS — N401 Enlarged prostate with lower urinary tract symptoms: Secondary | ICD-10-CM | POA: Diagnosis not present

## 2023-02-01 DIAGNOSIS — R2689 Other abnormalities of gait and mobility: Secondary | ICD-10-CM | POA: Diagnosis not present

## 2023-02-01 DIAGNOSIS — M6281 Muscle weakness (generalized): Secondary | ICD-10-CM | POA: Diagnosis not present

## 2023-02-03 ENCOUNTER — Telehealth: Payer: Self-pay | Admitting: Cardiovascular Disease

## 2023-02-03 DIAGNOSIS — R2689 Other abnormalities of gait and mobility: Secondary | ICD-10-CM | POA: Diagnosis not present

## 2023-02-03 DIAGNOSIS — M6281 Muscle weakness (generalized): Secondary | ICD-10-CM | POA: Diagnosis not present

## 2023-02-03 NOTE — Telephone Encounter (Signed)
Patient states for the past month he has been very weak and she assumes one of his medications may need to be adjusted. He states he saw another doctor today who recommended scheduling an appointment to go over medications. Patient's wife mentions that the patient also began therapy but he is still declining.

## 2023-02-03 NOTE — Telephone Encounter (Signed)
Spoke with wife regarding patient  He is doing therapy twice a week  Has episodes where he feels he can not walk Saw PCP recently and was advised to follow up with cardiologist soon to review medications currently taking PCP did labs, asked wife to ask them to send copy when resulted since they had not heard anything Scheduled appointment with Overton Mam NP for next week

## 2023-02-09 ENCOUNTER — Ambulatory Visit (HOSPITAL_BASED_OUTPATIENT_CLINIC_OR_DEPARTMENT_OTHER): Payer: Medicare HMO | Admitting: Family

## 2023-02-16 ENCOUNTER — Ambulatory Visit
Admission: RE | Admit: 2023-02-16 | Discharge: 2023-02-16 | Disposition: A | Discharge status: Home / Self Care | Payer: Medicare HMO | Source: Ambulatory Visit | Attending: Internal Medicine | Admitting: Internal Medicine

## 2023-02-16 ENCOUNTER — Other Ambulatory Visit: Payer: Self-pay | Admitting: Internal Medicine

## 2023-02-16 DIAGNOSIS — I7143 Infrarenal abdominal aortic aneurysm, without rupture: Secondary | ICD-10-CM | POA: Diagnosis not present

## 2023-02-16 DIAGNOSIS — I480 Paroxysmal atrial fibrillation: Secondary | ICD-10-CM | POA: Diagnosis not present

## 2023-02-16 DIAGNOSIS — M545 Low back pain, unspecified: Secondary | ICD-10-CM

## 2023-02-16 DIAGNOSIS — E1151 Type 2 diabetes mellitus with diabetic peripheral angiopathy without gangrene: Secondary | ICD-10-CM | POA: Diagnosis not present

## 2023-02-16 DIAGNOSIS — F319 Bipolar disorder, unspecified: Secondary | ICD-10-CM | POA: Diagnosis not present

## 2023-02-16 DIAGNOSIS — R001 Bradycardia, unspecified: Secondary | ICD-10-CM | POA: Diagnosis not present

## 2023-02-16 DIAGNOSIS — M8588 Other specified disorders of bone density and structure, other site: Secondary | ICD-10-CM | POA: Diagnosis not present

## 2023-02-16 DIAGNOSIS — E1140 Type 2 diabetes mellitus with diabetic neuropathy, unspecified: Secondary | ICD-10-CM | POA: Diagnosis not present

## 2023-02-16 DIAGNOSIS — D649 Anemia, unspecified: Secondary | ICD-10-CM | POA: Diagnosis not present

## 2023-02-16 DIAGNOSIS — D6869 Other thrombophilia: Secondary | ICD-10-CM | POA: Diagnosis not present

## 2023-02-16 DIAGNOSIS — J449 Chronic obstructive pulmonary disease, unspecified: Secondary | ICD-10-CM | POA: Diagnosis not present

## 2023-02-22 DIAGNOSIS — R2689 Other abnormalities of gait and mobility: Secondary | ICD-10-CM | POA: Diagnosis not present

## 2023-02-22 DIAGNOSIS — M6281 Muscle weakness (generalized): Secondary | ICD-10-CM | POA: Diagnosis not present

## 2023-02-24 DIAGNOSIS — M6281 Muscle weakness (generalized): Secondary | ICD-10-CM | POA: Diagnosis not present

## 2023-02-24 DIAGNOSIS — R2689 Other abnormalities of gait and mobility: Secondary | ICD-10-CM | POA: Diagnosis not present

## 2023-03-01 DIAGNOSIS — N400 Enlarged prostate without lower urinary tract symptoms: Secondary | ICD-10-CM | POA: Diagnosis not present

## 2023-03-01 DIAGNOSIS — M6281 Muscle weakness (generalized): Secondary | ICD-10-CM | POA: Diagnosis not present

## 2023-03-01 DIAGNOSIS — K219 Gastro-esophageal reflux disease without esophagitis: Secondary | ICD-10-CM | POA: Diagnosis not present

## 2023-03-01 DIAGNOSIS — R2689 Other abnormalities of gait and mobility: Secondary | ICD-10-CM | POA: Diagnosis not present

## 2023-03-01 DIAGNOSIS — E1151 Type 2 diabetes mellitus with diabetic peripheral angiopathy without gangrene: Secondary | ICD-10-CM | POA: Diagnosis not present

## 2023-03-01 DIAGNOSIS — E782 Mixed hyperlipidemia: Secondary | ICD-10-CM | POA: Diagnosis not present

## 2023-03-01 DIAGNOSIS — I10 Essential (primary) hypertension: Secondary | ICD-10-CM | POA: Diagnosis not present

## 2023-03-05 DIAGNOSIS — M6281 Muscle weakness (generalized): Secondary | ICD-10-CM | POA: Diagnosis not present

## 2023-03-05 DIAGNOSIS — R2689 Other abnormalities of gait and mobility: Secondary | ICD-10-CM | POA: Diagnosis not present

## 2023-03-08 DIAGNOSIS — M6281 Muscle weakness (generalized): Secondary | ICD-10-CM | POA: Diagnosis not present

## 2023-03-08 DIAGNOSIS — R2689 Other abnormalities of gait and mobility: Secondary | ICD-10-CM | POA: Diagnosis not present

## 2023-03-10 DIAGNOSIS — M6281 Muscle weakness (generalized): Secondary | ICD-10-CM | POA: Diagnosis not present

## 2023-03-10 DIAGNOSIS — R2689 Other abnormalities of gait and mobility: Secondary | ICD-10-CM | POA: Diagnosis not present

## 2023-03-15 DIAGNOSIS — R2689 Other abnormalities of gait and mobility: Secondary | ICD-10-CM | POA: Diagnosis not present

## 2023-03-15 DIAGNOSIS — M6281 Muscle weakness (generalized): Secondary | ICD-10-CM | POA: Diagnosis not present

## 2023-03-17 DIAGNOSIS — M6281 Muscle weakness (generalized): Secondary | ICD-10-CM | POA: Diagnosis not present

## 2023-03-17 DIAGNOSIS — R2689 Other abnormalities of gait and mobility: Secondary | ICD-10-CM | POA: Diagnosis not present

## 2023-03-22 DIAGNOSIS — M6281 Muscle weakness (generalized): Secondary | ICD-10-CM | POA: Diagnosis not present

## 2023-03-22 DIAGNOSIS — R2689 Other abnormalities of gait and mobility: Secondary | ICD-10-CM | POA: Diagnosis not present

## 2023-03-24 DIAGNOSIS — M6281 Muscle weakness (generalized): Secondary | ICD-10-CM | POA: Diagnosis not present

## 2023-03-24 DIAGNOSIS — R2689 Other abnormalities of gait and mobility: Secondary | ICD-10-CM | POA: Diagnosis not present

## 2023-03-29 ENCOUNTER — Ambulatory Visit (HOSPITAL_BASED_OUTPATIENT_CLINIC_OR_DEPARTMENT_OTHER): Payer: Medicare HMO | Admitting: Cardiovascular Disease

## 2023-03-29 ENCOUNTER — Encounter (HOSPITAL_BASED_OUTPATIENT_CLINIC_OR_DEPARTMENT_OTHER): Payer: Self-pay | Admitting: Cardiovascular Disease

## 2023-03-29 VITALS — BP 130/80 | HR 75 | Ht 73.0 in | Wt 178.5 lb

## 2023-03-29 DIAGNOSIS — I714 Abdominal aortic aneurysm, without rupture, unspecified: Secondary | ICD-10-CM | POA: Diagnosis not present

## 2023-03-29 DIAGNOSIS — R531 Weakness: Secondary | ICD-10-CM

## 2023-03-29 DIAGNOSIS — I10 Essential (primary) hypertension: Secondary | ICD-10-CM

## 2023-03-29 DIAGNOSIS — Z79899 Other long term (current) drug therapy: Secondary | ICD-10-CM

## 2023-03-29 DIAGNOSIS — I480 Paroxysmal atrial fibrillation: Secondary | ICD-10-CM | POA: Diagnosis not present

## 2023-03-29 DIAGNOSIS — E782 Mixed hyperlipidemia: Secondary | ICD-10-CM | POA: Diagnosis not present

## 2023-03-29 HISTORY — DX: Weakness: R53.1

## 2023-03-29 NOTE — Assessment & Plan Note (Signed)
Lipids are well-controlled on atorvastatin.  However, we will hold it for 2 weeks to see if this helps with his muscle discomfort and weakness.  If not, we need to resume his statin.

## 2023-03-29 NOTE — Assessment & Plan Note (Signed)
Douglas Hurst had PAF in the setting of acute illness.  He had reduced systolic function at the time and this has normalized.  He has no HF symptoms and has maintained sinus rhythm.  His family notes tremor.  We will stop amiodarone.  Metoprolol was already discontinued due to fatigue.  Continue Eliquis.

## 2023-03-29 NOTE — Progress Notes (Signed)
Cardiology Office Note:    Date:  03/29/2023   ID:  Douglas Friedlander., DOB March 20, 1941, MRN [id number]  PCP:  Kathalene Frames, MD   [facility] Prov[facility] Cardiologist:  Skeet Latch, MD     Referring MD: Lavone Orn, MD   No chief complaint on file.   History of Present Illness:    Douglas Boelens. is a 82 y.o. male with a hx of atrial fibrillation (onset 03/2022), mild ascending aortic aneurysm, hypertension, hyperlipidemia, GERD, gallstones, BPH, diabetes type 2, COPD, and bipolar disorder, here for follow-up.  I initially met the patient in the hospital 03/2022. He had presented to the ED on 4/29 with RUQ pain and weakness. He was admitted and underwent cholecystostomy tube placement 4/30. Subsequently he developed new onset atrial fibrillation with RVR and depressed LVEF per echocardiogram. LVEF was 40-45% with global hypokinesis and mild LVH. He had grade 1 diastolic dysfunction. He was treated with IV diltiazem and metoprolol and converted to sinus rhythm. Repeat echo 04/2022 showed improvement in LVEF to 60-65%. He was managed with amiodarone and Eliquis. He followed up with Laurann Montana, NP 09/2022 and was doing well from a cardiac standpoint. Blood pressure was low and metoprolol was reduced. He was at [facility] skilled nursing facility.   Today, he is accompanied by two family members who are the main historians. They note that he has been experiencing significant tremors and gradually worsening weakness despite continued physical therapy. He enjoys eating at restaurants, although it has been difficult to walk inside. He often lacks the strength to get back up and return to the car. Currently he is at home, not at the [facility] SN[facility]. At home he is mostly sitting all day. He does continue to participate in physical therapy. When he does, weights are placed on his legs which he states causes soreness and myalgias. Additionally he complains of some chronic back pain  which is usually not severe enough for him to request Tylenol. On non-therapy days his family encourages him to walk around the house. He denies any shortness of breath or palpitations, Once in a while some puffiness is noted in his ankles, improved with elevation. He denies any chest pain, lightheadedness, headaches, syncope, orthopnea, or PND.   Past Medical History:  Diagnosis Date   Bipolar disorder    Chronic back pain    Complication of anesthesia    prolonged sedation and confusion   COPD (chronic obstructive pulmonary disease)    Dr. Laurann Montana   Diabetes mellitus without complication    type 2   Diverticulosis    Dysrhythmia    Afib  onset 03-2022   ED (erectile dysfunction)    Gallstones    GERD (gastroesophageal reflux disease)    Hiatal hernia    Hyperlipidemia    Hypertension    Weakness 03/29/2023   Wears dentures    upper   Wears glasses    Wears hearing aid in both ears     Past Surgical History:  Procedure Laterality Date   BALLOON DILATION N/A 10/01/2015   Procedure: BALLOON DILATION;  Surgeon: Garlan Fair, MD;  Location: [facility];  Service: Endoscopy;  Laterality: N/A;   CATARACT EXTRACTION, BILATERAL     CHOLECYSTECTOMY N/A 07/08/2022   Procedure: LAPAROSCOPIC CHOLECYSTECTOMY WITH INTRAOPERATIVE CHOLANGIOGRAM AND LAPAROSCOPIC COMMON DUCT EXPLORATION;  Surgeon: Felicie Morn, MD;  Location: [facility];  Service: General;  Laterality: N/A;   COLONOSCOPY     removed polyps   COLONOSCOPY  WITH PROPOFOL N/A 09/01/2016   Procedure: COLONOSCOPY WITH PROPOFOL;  Surgeon: Garlan Fair, MD;  Location: [facility];  Service: Endoscopy;  Laterality: N/A;   ERCP N/A 07/09/2022   Procedure: ENDOSCOPIC RETROGRADE CHOLANGIOPANCREATOGRAPHY (ERCP);  Surgeon: Clarene Essex, MD;  Location: [facility];  Service: Gastroenterology;  Laterality: N/A;   ESOPHAGEAL DILATION  06/23/2022   Procedure: PYLORIC  DILATION;  Surgeon: Clarene Essex, MD;  Location: [facility];   Service: Gastroenterology;;   ESOPHAGOGASTRODUODENOSCOPY N/A 06/23/2022   Procedure: ESOPHAGOGASTRODUODENOSCOPY (EGD);  Surgeon: Clarene Essex, MD;  Location: [facility];  Service: Gastroenterology;  Laterality: N/A;   ESOPHAGOGASTRODUODENOSCOPY (EGD) WITH PROPOFOL N/A 10/01/2015   Procedure: ESOPHAGOGASTRODUODENOSCOPY (EGD) WITH PROPOFOL;  Surgeon: Garlan Fair, MD;  Location: [facility];  Service: Endoscopy;  Laterality: N/A;   FISTULOTOMY  07/09/2022   Procedure: FISTULOTOMY;  Surgeon: Clarene Essex, MD;  Location: [facility];  Service: Gastroenterology;;   HEMOSTASIS CLIP PLACEMENT  06/23/2022   Procedure: HEMOSTASIS CLIP PLACEMENT;  Surgeon: Clarene Essex, MD;  Location: [facility];  Service: Gastroenterology;;   IR CATHETER TUBE CHANGE  08/20/2022   IR EXCHANGE BILIARY DRAIN  05/15/2022   IR EXCHANGE BILIARY DRAIN  05/26/2022   IR EXCHANGE BILIARY DRAIN  06/11/2022   IR EXCHANGE BILIARY DRAIN  06/23/2022   IR PERC CHOLECYSTOSTOMY  04/26/2022   IR RADIOLOGIST EVAL & MGMT  09/16/2022   KNEE ARTHROSCOPY     TONSILLECTOMY      Current Medications: Current Meds  Medication Sig   acetaminophen (TYLENOL) 325 MG tablet Take 2 tablets (650 mg total) by mouth every 6 (six) hours as needed for mild pain (or Fever >/= 101).   apixaban (ELIQUIS) 5 MG TABS tablet Take 1 tablet (5 mg total) by mouth 2 (two) times daily.   atorvastatin (LIPITOR) 40 MG tablet Take 40 mg by mouth every evening.   finasteride (PROSCAR) 5 MG tablet Take 5 mg by mouth daily.   metFORMIN (GLUCOPHAGE-XR) 500 MG 24 hr tablet Take 500 mg by mouth daily with breakfast.   Multiple Vitamin (MULTIVITAMIN WITH MINERALS) TABS tablet Take 1 tablet by mouth daily.   pantoprazole (PROTONIX) 40 MG tablet Take 40 mg by mouth every morning.   polyethylene glycol powder (GLYCOLAX/MIRALAX) 17 GM/SCOOP powder Take 17 g by mouth daily. (Patient taking differently: Take 17 g by mouth daily as needed for mild constipation or moderate  constipation.)   senna-docusate (SENOKOT-S) 8.6-50 MG tablet Take 1 tablet by mouth at bedtime. (Patient taking differently: Take 1 tablet by mouth at bedtime as needed for mild constipation or moderate constipation.)   SPIRIVA RESPIMAT 2.5 MCG/ACT AERS Inhale 2 puffs into the lungs daily as needed (COPD).   tadalafil (CIALIS) 20 MG tablet Take 20 mg by mouth every three (3) days as needed for erectile dysfunction.   tamsulosin (FLOMAX) 0.4 MG CAPS capsule Take 0.4 mg by mouth daily after supper.   [DISCONTINUED] amiodarone (PACERONE) 200 MG tablet Take 1 tablet (200 mg total) by mouth daily.     Allergies:   Patient has no known allergies.   Social History   Socioeconomic History   Marital status: Married    Spouse name: Not on file   Number of children: Not on file   Years of education: Not on file   Highest education level: Not on file  Occupational History   Not on file  Tobacco Use   Smoking status: Former    Years: 35    Types: Cigarettes   Smokeless  tobacco: Never  Vaping Use   Vaping Use: Never used  Substance and Sexual Activity   Alcohol use: No   Drug use: No   Sexual activity: Not Currently  Other Topics Concern   Not on file  Social History Narrative   Not on file   Social Determinants of Health   Financial Resource Strain: Not on file  Food Insecurity: Not on file  Transportation Needs: Not on file  Physical Activity: Not on file  Stress: Not on file  Social Connections: Not on file     Family History: The patient's family history includes CAD in his brother, father, and mother; CVA in his father; Cancer in his father and mother; Cancer - Colon in his mother; Dementia in his father and mother; Heart attack in his brother and mother.  ROS:   Please see the history of present illness.    (+) Essential tremors (+) Generalized weakness (+) Bilateral LE myalgias and soreness (+) Chronic back pain (+) LE edema of bilateral ankles All other systems  reviewed and are negative.  EKGs/Labs/Other Studies Reviewed:    The following studies were reviewed today:  Echo  05/12/2022:  1. Left ventricular ejection fraction, by estimation, is 60 to 65%. The  left ventricle has normal function. The left ventricle has no regional  wall motion abnormalities. Left ventricular diastolic parameters are  consistent with Grade I diastolic  dysfunction (impaired relaxation).   2. Right ventricular systolic function is normal. The right ventricular  size is normal. There is normal pulmonary artery systolic pressure. The  estimated right ventricular systolic pressure is 0000000 mmHg.   3. Left atrial size was mildly dilated.   4. The mitral valve is abnormal. Trivial mitral valve regurgitation.   5. The aortic valve is tricuspid. Aortic valve regurgitation is trivial.  Aortic valve sclerosis is present, with no evidence of aortic valve  stenosis.   6. Aortic dilatation noted. There is mild dilatation of the ascending  aorta, measuring 40 mm.   7. The inferior vena cava is normal in size with greater than 50%  respiratory variability, suggesting right atrial pressure of 3 mmHg.   Comparison(s): Changes from prior study are noted. 4/60/2023: LVEF 40-45%.   EKG:   EKG is personally reviewed. 03/29/2023: Sinus rhythm. Rate 75 bpm. Nonspecific T wave abnormalities. QTC 464 ms  Recent Labs: 06/15/2022: TSH 0.849 08/27/2022: ALT 29 09/06/2022: BUN 25; Creatinine, Ser 1.06; Hemoglobin 11.1; Magnesium 1.8; Platelets 251; Potassium 4.2; Sodium 135   Recent Lipid Panel No results found for: "CHOL", "TRIG", "HDL", "CHOLHDL", "VLDL", "LDLCALC", "LDLDIRECT"   Risk Assessment/Calculations:    CHA2DS2-VASc Score = 5   This indicates a 7.2% annual risk of stroke. The patient's score is based upon: CHF History: 1 HTN History: 1 Diabetes History: 1 Stroke History: 0 Vascular Disease History: 0 Age Score: 2 Gender Score: 0          Physical Exam:    Wt  Readings from Last 3 Encounters:  03/29/23 178 lb 8 oz (81 kg)  09/30/22 157 lb (71.2 kg)  09/29/22 157 lb (71.2 kg)     VS:  BP 130/80 (BP Location: Left Arm, Patient Position: Sitting, Cuff Size: Normal)   Pulse 75   Ht 6\' 1"  (1.854 m)   Wt 178 lb 8 oz (81 kg)   BMI 23.55 kg/m  , BMI Body mass index is 23.55 kg/m. GENERAL:  Well appearing HEENT: Pupils equal round and reactive, fundi not visualized, oral  mucosa unremarkable NECK:  No jugular venous distention, waveform within normal limits, carotid upstroke brisk and symmetric, no bruits, no thyromegaly LUNGS:  Clear to auscultation bilaterally HEART:  RRR.  PMI not displaced or sustained,S1 and S2 within normal limits, no S3, no S4, no clicks, no rubs, no murmurs ABD:  Flat, positive bowel sounds normal in frequency in pitch, no bruits, no rebound, no guarding, no midline pulsatile mass, no hepatomegaly, no splenomegaly EXT:  2 plus pulses throughout, no edema, no cyanosis no clubbing SKIN:  No rashes no nodules NEURO:  Cranial nerves II through XII grossly intact, motor grossly intact throughout PSYCH:  Cognitively intact, oriented to person place and time   ASSESSMENT:    1. On amiodarone therapy   2. PAF (paroxysmal atrial fibrillation)   3. Essential hypertension   4. Abdominal aortic aneurysm (AAA) without rupture, unspecified part   5. Mixed hyperlipidemia   6. Weakness    PLAN:    PAF (paroxysmal atrial fibrillation) [facility] Douglas Hurst had PAF in the setting of acute illness.  He had reduced systolic function at the time and this has normalized.  He has no HF symptoms and has maintained sinus rhythm.  His family notes tremor.  We will stop amiodarone.  Metoprolol was already discontinued due to fatigue.  Continue Eliquis.   Essential hypertension Blood pressure is very well-controlled off antihypertensives.  Abdominal aortic aneurysm (HCC) 4.0 cm infrarenal aortic aneurysm.  Metoprolol was discontinued as above.  He  follows with vascular.  Mixed hyperlipidemia Lipids are well-controlled on atorvastatin.  However, we will hold it for 2 weeks to see if this helps with his muscle discomfort and weakness.  If not, we need to resume his statin.  Weakness Douglas Hurst has generalized weakness and fatigue.  He is doing physical therapy twice per week.  We discussed the fact that he rarely gets any exercise outside of this, he seems to be getting much weaker since leaving rehab.  Recommended that he start getting some exercise during commercials while he is sitting.  Continue treatment for his back pain and leg pain so that he is better able to move.  I do not think that his symptoms are cardiac.  We are stopping amiodarone and atorvastatin to see if that helps as above.       Disposition: FU with APP in 4 months.  Medication Adjustments/Labs and Tests Ordered: Current medicines are reviewed at length with the patient today.  Concerns regarding medicines are outlined above.   Orders Placed This Encounter  Procedures   EKG 12-Lead   No orders of the defined types were placed in this encounter.  Patient Instructions  Medication Instructions:  Your physician has recommended you make the following change in your medication:   Stop: metoprolol   Stop: Amiodarone   Hold: Lipitor- call us in 2 weeks to update [place] on muscle aches  Follow-Up: At [facility], you and your health needs are our priority.  As part of our continuing mission to provide you with exceptional heart care, we have created designated Provider Care Teams.  These Care Teams include your primary Cardiologist (physician) and Advanced Practice Providers (APPs -  Physician Assistants and Nurse Practitioners) who all work together to provide you with the care you need, when you need it.  We recommend signing up for the patient portal called "MyChart".  Sign up information is provided on this After Visit Summary.  MyChart is used to  connect with patients  for Virtual Visits (Telemedicine).  Patients are able to view lab/test results, encounter notes, upcoming appointments, etc.  Non-urgent messages can be sent to your provider as well.   To learn more about what you can do with MyChart, go to [email or web address].    Your next appointment:   4 month(s)  Provider:   Laurann Montana, NP     [facility] Stumpf,acting as a scribe for Skeet Latch, MD.,have documented all relevant documentation on the behalf of Skeet Latch, MD,as directed by  Skeet Latch, MD while in the presence of Skeet Latch, MD.  I, [place] Lin[place], MD have reviewed all documentation for this visit.  The documentation of the exam, diagnosis, procedures, and orders on 03/29/2023 are all accurate and complete.   Signed, Skeet Latch, MD  03/29/2023 9:47 AM    [place]

## 2023-03-29 NOTE — Assessment & Plan Note (Signed)
Blood pressure is very well-controlled off antihypertensives.

## 2023-03-29 NOTE — Assessment & Plan Note (Signed)
4.0 cm infrarenal aortic aneurysm.  Metoprolol was discontinued as above.  He follows with vascular.

## 2023-03-29 NOTE — Patient Instructions (Signed)
Medication Instructions:  Your physician has recommended you make the following change in your medication:   Stop: metoprolol   Stop: Amiodarone   Hold: Lipitor- call us in 2 weeks to update [place] on muscle aches  Follow-Up: At [facility], you and your health needs are our priority.  As part of our continuing mission to provide you with exceptional heart care, we have created designated Provider Care Teams.  These Care Teams include your primary Cardiologist (physician) and Advanced Practice Providers (APPs -  Physician Assistants and Nurse Practitioners) who all work together to provide you with the care you need, when you need it.  We recommend signing up for the patient portal called "MyChart".  Sign up information is provided on this After Visit Summary.  MyChart is used to connect with patients for Virtual Visits (Telemedicine).  Patients are able to view lab/test results, encounter notes, upcoming appointments, etc.  Non-urgent messages can be sent to your provider as well.   To learn more about what you can do with MyChart, go to [email or web address].    Your next appointment:   4 month(s)  Provider:   Laurann Montana, NP

## 2023-03-29 NOTE — Assessment & Plan Note (Signed)
Douglas Hurst has generalized weakness and fatigue.  He is doing physical therapy twice per week.  We discussed the fact that he rarely gets any exercise outside of this, he seems to be getting much weaker since leaving rehab.  Recommended that he start getting some exercise during commercials while he is sitting.  Continue treatment for his back pain and leg pain so that he is better able to move.  I do not think that his symptoms are cardiac.  We are stopping amiodarone and atorvastatin to see if that helps as above.

## 2023-03-30 DIAGNOSIS — R2689 Other abnormalities of gait and mobility: Secondary | ICD-10-CM | POA: Diagnosis not present

## 2023-03-30 DIAGNOSIS — M6281 Muscle weakness (generalized): Secondary | ICD-10-CM | POA: Diagnosis not present

## 2023-03-31 DIAGNOSIS — F319 Bipolar disorder, unspecified: Secondary | ICD-10-CM | POA: Diagnosis not present

## 2023-03-31 DIAGNOSIS — R29898 Other symptoms and signs involving the musculoskeletal system: Secondary | ICD-10-CM | POA: Diagnosis not present

## 2023-03-31 DIAGNOSIS — E1151 Type 2 diabetes mellitus with diabetic peripheral angiopathy without gangrene: Secondary | ICD-10-CM | POA: Diagnosis not present

## 2023-03-31 DIAGNOSIS — I7143 Infrarenal abdominal aortic aneurysm, without rupture: Secondary | ICD-10-CM | POA: Diagnosis not present

## 2023-03-31 DIAGNOSIS — J449 Chronic obstructive pulmonary disease, unspecified: Secondary | ICD-10-CM | POA: Diagnosis not present

## 2023-03-31 DIAGNOSIS — I5022 Chronic systolic (congestive) heart failure: Secondary | ICD-10-CM | POA: Diagnosis not present

## 2023-03-31 DIAGNOSIS — F0390 Unspecified dementia without behavioral disturbance: Secondary | ICD-10-CM | POA: Diagnosis not present

## 2023-03-31 DIAGNOSIS — I480 Paroxysmal atrial fibrillation: Secondary | ICD-10-CM | POA: Diagnosis not present

## 2023-04-01 ENCOUNTER — Other Ambulatory Visit: Payer: Self-pay | Admitting: Internal Medicine

## 2023-04-01 DIAGNOSIS — R2689 Other abnormalities of gait and mobility: Secondary | ICD-10-CM | POA: Diagnosis not present

## 2023-04-01 DIAGNOSIS — M6281 Muscle weakness (generalized): Secondary | ICD-10-CM | POA: Diagnosis not present

## 2023-04-01 DIAGNOSIS — R29898 Other symptoms and signs involving the musculoskeletal system: Secondary | ICD-10-CM

## 2023-04-05 DIAGNOSIS — R2689 Other abnormalities of gait and mobility: Secondary | ICD-10-CM | POA: Diagnosis not present

## 2023-04-05 DIAGNOSIS — M6281 Muscle weakness (generalized): Secondary | ICD-10-CM | POA: Diagnosis not present

## 2023-04-06 DIAGNOSIS — H903 Sensorineural hearing loss, bilateral: Secondary | ICD-10-CM | POA: Diagnosis not present

## 2023-04-07 DIAGNOSIS — R2689 Other abnormalities of gait and mobility: Secondary | ICD-10-CM | POA: Diagnosis not present

## 2023-04-07 DIAGNOSIS — M6281 Muscle weakness (generalized): Secondary | ICD-10-CM | POA: Diagnosis not present

## 2023-04-11 ENCOUNTER — Ambulatory Visit
Admission: RE | Admit: 2023-04-11 | Discharge: 2023-04-11 | Disposition: A | Discharge status: Home / Self Care | Payer: Medicare HMO | Source: Ambulatory Visit | Attending: Internal Medicine | Admitting: Internal Medicine

## 2023-04-11 DIAGNOSIS — M48061 Spinal stenosis, lumbar region without neurogenic claudication: Secondary | ICD-10-CM | POA: Diagnosis not present

## 2023-04-11 DIAGNOSIS — M47816 Spondylosis without myelopathy or radiculopathy, lumbar region: Secondary | ICD-10-CM | POA: Diagnosis not present

## 2023-04-11 DIAGNOSIS — R29898 Other symptoms and signs involving the musculoskeletal system: Secondary | ICD-10-CM

## 2023-04-11 DIAGNOSIS — M545 Low back pain, unspecified: Secondary | ICD-10-CM | POA: Diagnosis not present

## 2023-04-14 ENCOUNTER — Ambulatory Visit: Payer: Medicare HMO | Admitting: Neurology

## 2023-04-14 ENCOUNTER — Encounter: Payer: Self-pay | Admitting: Neurology

## 2023-04-14 ENCOUNTER — Telehealth: Payer: Self-pay | Admitting: Cardiovascular Disease

## 2023-04-14 VITALS — BP 137/73 | HR 81 | Ht 73.0 in | Wt 167.0 lb

## 2023-04-14 DIAGNOSIS — R4189 Other symptoms and signs involving cognitive functions and awareness: Secondary | ICD-10-CM | POA: Insufficient documentation

## 2023-04-14 DIAGNOSIS — E782 Mixed hyperlipidemia: Secondary | ICD-10-CM

## 2023-04-14 DIAGNOSIS — G251 Drug-induced tremor: Secondary | ICD-10-CM | POA: Insufficient documentation

## 2023-04-14 DIAGNOSIS — Z9889 Other specified postprocedural states: Secondary | ICD-10-CM

## 2023-04-14 DIAGNOSIS — G620 Drug-induced polyneuropathy: Secondary | ICD-10-CM | POA: Diagnosis not present

## 2023-04-14 DIAGNOSIS — F09 Unspecified mental disorder due to known physiological condition: Secondary | ICD-10-CM | POA: Diagnosis not present

## 2023-04-14 NOTE — Progress Notes (Addendum)
[facility]  Provider:  Dr Vickey Huger Referring Provider: Emilio Aspen, * Primary Care Physician:  Emilio Aspen, MD  Chief Complaint  Patient presents with   New Patient (Initial Visit)    NEW DEMENTIA Patient  #1 with his wife and niece. CONSULT FOR DEMENTIA.     HPI:  Douglas Mathenia. is a 82 y.o. male and seen here in the presence of spouse and niece -upon referral from Dr. Orson Aloe for a Consultation/ Evaluation of Dementia. Douglas Hurst is a left handed individual with worsening cognitive decline over 12-18 months , and with physical decline as well.  The more recent health history for Douglas Hurst is important in the context of his cognitive and physical developments.  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 he was never that great with numbers it seems but he was very much in tune with his daily appointments date fully oriented until April 2023.  In April 2023 he had a fainting spell at home he collapsed, EMS had to bring him to the local hospital , and gallstones were diagnosed. He was discharged after unsuccessfully attempting the removal.  He went to [facility] to rehabilitation for 14 days, but there were mix ups with his medication.   He still had gallbladder obstruction, the gallstones were not successfully removed and [facility].  He was transferred to use and see [facility].  There the stones were removed but not the gallbladder -He also developed sepsis and pancreatitis as the pancreas was injured , and with this peritonitis he developed atrial fibrillation.  He was transferred back to [facility] and here the cholecystectomy was performed , the  peritonitis was discovered.   He was very ill and had to be fed through a feeding tube.   This time  transfer [facility].   He lost a significant  amount of weight, he became more tremulous and his cognition has declined rapidly as well as his physical stamina.  Today's Montreal cognitive assessment test could not be completed.  We also obtained a Mini-Mental status examination and he scored 12 out of 30 points which is very low.  I am not quite sure that this is his true baseline most recently some changes had been made to medication and he also received a new diagnosis of anemia.  This diagnoses apparently has not been shared with his wife for him.  His daily living activities are fairly impaired he does travel by taxi or automobile with assistance of others, he does not do laundry he can dial a few well-known numbers but does usually not operate a smart phone.  He needs to be accompanied on any shopping trips.  Meals have to be prepared and served, he does not participate in any housekeeping tasks, and he is incapable of dispensing his own medication or handling his own financial affairs. His wife stated she has always handled these things   He is now retired since 03-2022.   He was bedridden 14 days ago, and now walked with assistance into the exam room- the only change was a d/c  of Metoprolol and of Statin,  and Amiodarone - this isi likely a big contributor as it is a drug that is neurotoxic and myotoxic.      Review of Systems: Out of a complete 14 system  review, the patient complains of only the following symptoms, and all other reviewed systems are negative.  See above  Social History   Socioeconomic History   Marital status: Married    Spouse name: Not on file   Number of children: Not on file   Years of education: Not on file   Highest education level: Not on file  Occupational History   Not on file  Tobacco Use   Smoking status: Former    Years: 35    Types: Cigarettes   Smokeless tobacco: Never  Vaping Use   Vaping Use: Never used  Substance and Sexual Activity   Alcohol use: No   Drug use: No   Sexual activity:  Not Currently  Other Topics Concern   Not on file  Social History Narrative   Not on file   Social Determinants of Health   Financial Resource Strain: Not on file  Food Insecurity: Not on file  Transportation Needs: Not on file  Physical Activity: Not on file  Stress: Not on file  Social Connections: Not on file  Intimate Partner Violence: Not on file    Family History  Problem Relation Age of Onset   Heart attack Mother    Cancer - Colon Mother    CAD Mother    Cancer Mother    Dementia Mother    CAD Father    Cancer Father    CVA Father    Dementia Father    Heart attack Brother    CAD Brother     Past Medical History:  Diagnosis Date   Bipolar disorder    Chronic back pain    Complication of anesthesia    prolonged sedation and confusion   COPD (chronic obstructive pulmonary disease)    Dr. Valentina Lucks   Diabetes mellitus without complication    type 2   Diverticulosis    Dysrhythmia    Afib  onset 03-2022   ED (erectile dysfunction)    Gallstones    GERD (gastroesophageal reflux disease)    Hiatal hernia    Hyperlipidemia    Hypertension    Weakness 03/29/2023   Wears dentures    upper   Wears glasses    Wears hearing aid in both ears     Past Surgical History:  Procedure Laterality Date   BALLOON DILATION N/A 10/01/2015   Procedure: BALLOON DILATION;  Surgeon: Charolett Bumpers, MD;  Location: [facility];  Service: Endoscopy;  Laterality: N/A;   CATARACT EXTRACTION, BILATERAL     CHOLECYSTECTOMY N/A 07/08/2022   Procedure: LAPAROSCOPIC CHOLECYSTECTOMY WITH INTRAOPERATIVE CHOLANGIOGRAM AND LAPAROSCOPIC COMMON DUCT EXPLORATION;  Surgeon: Quentin Ore, MD;  Location: [facility];  Service: General;  Laterality: N/A;   COLONOSCOPY     removed polyps   COLONOSCOPY WITH PROPOFOL N/A 09/01/2016   Procedure: COLONOSCOPY WITH PROPOFOL;  Surgeon: Charolett Bumpers, MD;  Location: [facility];  Service: Endoscopy;  Laterality: N/A;   ERCP N/A 07/09/2022    Procedure: ENDOSCOPIC RETROGRADE CHOLANGIOPANCREATOGRAPHY (ERCP);  Surgeon: Vida Rigger, MD;  Location: [facility];  Service: Gastroenterology;  Laterality: N/A;   ESOPHAGEAL DILATION  06/23/2022   Procedure: PYLORIC  DILATION;  Surgeon: Vida Rigger, MD;  Location: [facility];  Service: Gastroenterology;;   ESOPHAGOGASTRODUODENOSCOPY N/A 06/23/2022   Procedure: ESOPHAGOGASTRODUODENOSCOPY (EGD);  Surgeon: Vida Rigger, MD;  Location: [facility];  Service: Gastroenterology;  Laterality: N/A;   ESOPHAGOGASTRODUODENOSCOPY (EGD) WITH PROPOFOL N/A 10/01/2015   Procedure: ESOPHAGOGASTRODUODENOSCOPY (EGD) WITH PROPOFOL;  Surgeon: Jone Baseman  Laural Benes, MD;  Location: [facility];  Service: Endoscopy;  Laterality: N/A;   FISTULOTOMY  07/09/2022   Procedure: FISTULOTOMY;  Surgeon: Vida Rigger, MD;  Location: [facility];  Service: Gastroenterology;;   HEMOSTASIS CLIP PLACEMENT  06/23/2022   Procedure: HEMOSTASIS CLIP PLACEMENT;  Surgeon: Vida Rigger, MD;  Location: [facility];  Service: Gastroenterology;;   IR CATHETER TUBE CHANGE  08/20/2022   IR EXCHANGE BILIARY DRAIN  05/15/2022   IR EXCHANGE BILIARY DRAIN  05/26/2022   IR EXCHANGE BILIARY DRAIN  06/11/2022   IR EXCHANGE BILIARY DRAIN  06/23/2022   IR PERC CHOLECYSTOSTOMY  04/26/2022   IR RADIOLOGIST EVAL & MGMT  09/16/2022   KNEE ARTHROSCOPY     TONSILLECTOMY      Current Outpatient Medications  Medication Sig Dispense Refill   acetaminophen (TYLENOL) 325 MG tablet Take 2 tablets (650 mg total) by mouth every 6 (six) hours as needed for mild pain (or Fever >/= 101). 20 tablet 0   apixaban (ELIQUIS) 5 MG TABS tablet Take 1 tablet (5 mg total) by mouth 2 (two) times daily. 180 tablet 3   finasteride (PROSCAR) 5 MG tablet Take 5 mg by mouth daily.     metFORMIN (GLUCOPHAGE-XR) 500 MG 24 hr tablet Take 500 mg by mouth daily with breakfast.     Multiple Vitamin (MULTIVITAMIN WITH MINERALS) TABS tablet Take 1 tablet by mouth daily.     pantoprazole  (PROTONIX) 40 MG tablet Take 40 mg by mouth every morning.     polyethylene glycol powder (GLYCOLAX/MIRALAX) 17 GM/SCOOP powder Take 17 g by mouth daily. (Patient taking differently: Take 17 g by mouth daily as needed for mild constipation or moderate constipation.) 238 g 0   senna-docusate (SENOKOT-S) 8.6-50 MG tablet Take 1 tablet by mouth at bedtime. (Patient taking differently: Take 1 tablet by mouth at bedtime as needed for mild constipation or moderate constipation.) 30 tablet 0   SPIRIVA RESPIMAT 2.5 MCG/ACT AERS Inhale 2 puffs into the lungs daily as needed (COPD).     tadalafil (CIALIS) 20 MG tablet Take 20 mg by mouth every three (3) days as needed for erectile dysfunction.     tamsulosin (FLOMAX) 0.4 MG CAPS capsule Take 0.4 mg by mouth daily after supper.     atorvastatin (LIPITOR) 40 MG tablet Take 40 mg by mouth every evening.     No current facility-administered medications for this visit.    Allergies as of 04/14/2023   (No Known Allergies)    Vitals: BP 137/73 (BP Location: Left Arm, Patient Position: Sitting, Cuff Size: Small)   Pulse 81   Ht 6\' 1"  (1.854 m)   Wt 167 lb (75.8 kg)   BMI 22.03 kg/m  Last Weight:  Wt Readings from Last 1 Encounters:  04/14/23 167 lb (75.8 kg)   Last Height:   Ht Readings from Last 1 Encounters:  04/14/23 6\' 1"  (1.854 m)   Last BMI: @LASTBMI  Physical exam:  General: The patient is awake, alert and appears not in acute distress.  The patient is well groomed. Head: Normocephalic, atraumatic.  Neck is supple. 16 "   Cardiovascular:  Regular rate and palpable peripheral pulse:  Respiratory: clear to auscultation.  Mallampati 2 with a  trembling tongue., Skin:  Without evidence of edema, or rash,  pallor  Trunk: BMI is 22,   and patient  has normal posture.   Neurologic exam : The patient is awake and alert, oriented to place and time.   Memory subjective described  as intact.  There is a normal attention span & concentration  ability.  Speech is fluent with dysphonia and  aphasia.  Mood and affect are appropriate.  Cranial nerves: Pupils are equal and briskly reactive to light. Funduscopic exam without  evidence of pallor or edema. Extraocular movements  in vertical and horizontal planes intact and without nystagmus. Visual fields by finger perimetry are intact. Hearing to finger rub intact.   Facial sensation intact to fine touch. Facial motor strength is symmetric and tongue and uvula move midline.  Motor exam:   rigid cog-wheeling , elevated  tone and reduced muscle bulk  but symmetric normal strength in all extremities. Grip Strength symmetric  Proximal strength of shoulder muscles and hip flexors was weaker than expected.  Pronatordrift.  Sensory:  Fine touch and vibration were tested.  Coordination: Rapid alternating movements in the fingers/hands were slowed and tremulous.  Finger-to-nose maneuver was tested and showed  evidence of  tremor. Gait and station: Patient walked with a walker, very stooped- shuffling .  Core Strength below  limits. Stance is not stable . He rose from the seat by bracing himself, he pulled up to the walker, and when he returned he very carefully sat down , keeping one hand on the walkers handle.  Deep tendon reflexes: in the  upper and lower extremities are symmetric and  brisk without Clonus. Babinski maneuver response is  downgoing.  Assessment: Total time for face to face interview and examination, for review of  images and laboratory testing, neurophysiology testing and pre-existing records, including out-of -network , was 55 minutes. Assessment is as follows here:  1) Douglas Hurst, Keefe. was seen here today in a first consultation dedicated to evaluate him for possible dementia.  Today is 14 April 2023 and a large part of our conversation today was to establish the history of present illness.  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 in April 2023 this all changed and he became severely ill.   The follow-up care included physical therapy and some reduction in medication.   I was very happy to hear that this gentleman 14 days ago was still bedridden and now can walk with a walker.  I do wonder if the discontinuation of amiodarone would have made this quick recovery possible but it is not impossible that amiodarone is the main culprit for some of his progressed physical ailments.  He also is no longer on metoprolol and on statins and he can now eat regular food and keep it down. He remains on Eloquis.   2) his physical ailments been parallel to his cognitive decline and the development of a tremor that was so severe that it handicapped him in addition.  The patient and his wife both describe a high amplitude tremor that is now much milder.  I think he is ready to take on some activities of daily living by himself or with a little supervision.  He is certainly at this time not ready to drive again but I would not exclude the possibility that he returns to driving in the future.  I see this gentleman at 1 time and place and to see the trend of his hopefully future recovery I would not venture at this time to make a diagnosis of dementia.  This is too early because  there are too many external factors and they are still in flux.    So my goal is to repeat a Mini-Mental status examination in 4 to 6 months and I also would like for him to be eating a few more protein based calories each day to build up muscle mass again he should continue with his physical therapy.  I like for him to use his walker every day as exercise at home is necessary to and he should start again reading the newspaper more detailed keeping a track of time date and developments this way he at this time this please fully oblivious to  date and.  His tremor may be improving further and that could make a recovery of his handwriting possible his Mini-Mental status examination showed the tremor impairing his ability to write and draw.  I will not add any medications today for the obvious reasons.  I will wish the patient the best of luck and health for the next 6 months we are meeting between 4 and 6 months again. After I closed this chart, the patient's wife informed me that the patient ha undergone an MRI on SUNDAY- I had no idea. It turned out to be a lumbar MRI and it was fairly normal for age.   Plan:   1)   RV in 4-6 months with repeat MMSE.  Today 12/ 30 points.  2)  Tremor check in that upcoming visit. Gait stabilization with PT. Daily exercises.   3) consider high dose b complex and multivitamin with some iron- for anemia.  MRI with the next visit- BRAIN - he can hardly be expected to hold still for 45 minutes in current state of health - He has had significant behavior changes, looking at possible watershed  infarct fronto temporal  atrophy, etc.   Melvyn Novas, MD   04-14-2023.

## 2023-04-14 NOTE — Telephone Encounter (Signed)
Will forward to Dr [place] for review  

## 2023-04-14 NOTE — Telephone Encounter (Signed)
Patient's wife was returning call in regards to how the patient was doing since being taken off atorvastatin (LIPITOR) 40 MG tablet and Amiodarone. Says that patient has more clarity thinking speaking better, walking better, and getting up and down better. Saw neurologist and also said that patient is making improvement without medication.

## 2023-04-14 NOTE — Patient Instructions (Signed)
neurophysiology testing and pre-existing records, including out-of -network , was 55 minutes. Assessment is as follows here:  1) [name], Douglas Hurst. was seen here today in a first consultation dedicated to evaluate him for possible dementia.  Today is 14 April 2023 and a large part of our conversation today was to establish the history of present illness.  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 in April 2023 this all changed and he became severely ill.  The follow-up care included physical therapy and some reduction in medication.  I was very happy to hear that this gentleman 14 days ago was still bedridden and now can walk with a walker.  I do wonder if the discontinuation of amiodarone would have made this quick recovery possible but it is not impossible that amiodarone is the main culprit for some of his progressed physical ailments.  He also is no longer on metoprolol and on statins and he can now eat regular food and keep it down. 2) his physical ailments been parallel to his cognitive decline and the development of a tremor that was so severe that it handicapped him in addition.  The patient and his wife both describe a high amplitude tremor that is now much milder.  I think he is ready to take on some activities of daily living by himself or with a little supervision.  He is certainly at this time not ready to drive again but I would not exclude the possibility that he returns to driving in the future.  I see this gentleman at 1 time and place and to see the trend of his hopefully future recovery I would not venture at this time to make a diagnosis of dementia.  This is too early because there are too many external factors and they are still in flux.    So my goal is to repeat a Mini-Mental status examination in 4 to 6  months and I also would like for him to be eating a few more protein based calories each day to build up muscle mass again he should continue with his physical therapy.  I like for him to use his walker every day as exercise at home is necessary to and he should start again reading the newspaper more detailed keeping a track of time date and developments this way he at this time this please fully oblivious to date and.  His tremor may be improving further and that could make a recovery of his handwriting possible his Mini-Mental status examination showed the tremor impairing his ability to write and draw.  I will not add any medications today for the obvious reasons.  I will wish the patient the best of luck and health for the next 6 months we are meeting between 4 and 6 months again  Plan:   1)   RV in 4-6 months with repeat MMSE.  Today 12/ 30 points.  2)  T remor check in that upcoming visit.  3) consider high dose b complex and multivitamin with some iron- for anemia.

## 2023-04-15 DIAGNOSIS — R2689 Other abnormalities of gait and mobility: Secondary | ICD-10-CM | POA: Diagnosis not present

## 2023-04-15 DIAGNOSIS — M6281 Muscle weakness (generalized): Secondary | ICD-10-CM | POA: Diagnosis not present

## 2023-04-21 DIAGNOSIS — R2689 Other abnormalities of gait and mobility: Secondary | ICD-10-CM | POA: Diagnosis not present

## 2023-04-21 DIAGNOSIS — M6281 Muscle weakness (generalized): Secondary | ICD-10-CM | POA: Diagnosis not present

## 2023-04-23 DIAGNOSIS — R2689 Other abnormalities of gait and mobility: Secondary | ICD-10-CM | POA: Diagnosis not present

## 2023-04-23 DIAGNOSIS — M6281 Muscle weakness (generalized): Secondary | ICD-10-CM | POA: Diagnosis not present

## 2023-04-28 DIAGNOSIS — M6281 Muscle weakness (generalized): Secondary | ICD-10-CM | POA: Diagnosis not present

## 2023-04-28 DIAGNOSIS — R2689 Other abnormalities of gait and mobility: Secondary | ICD-10-CM | POA: Diagnosis not present

## 2023-04-29 DIAGNOSIS — N401 Enlarged prostate with lower urinary tract symptoms: Secondary | ICD-10-CM | POA: Diagnosis not present

## 2023-04-29 DIAGNOSIS — R350 Frequency of micturition: Secondary | ICD-10-CM | POA: Diagnosis not present

## 2023-04-29 DIAGNOSIS — R3915 Urgency of urination: Secondary | ICD-10-CM | POA: Diagnosis not present

## 2023-04-29 DIAGNOSIS — R351 Nocturia: Secondary | ICD-10-CM | POA: Diagnosis not present

## 2023-04-30 DIAGNOSIS — R2689 Other abnormalities of gait and mobility: Secondary | ICD-10-CM | POA: Diagnosis not present

## 2023-04-30 DIAGNOSIS — M6281 Muscle weakness (generalized): Secondary | ICD-10-CM | POA: Diagnosis not present

## 2023-05-04 DIAGNOSIS — R2689 Other abnormalities of gait and mobility: Secondary | ICD-10-CM | POA: Diagnosis not present

## 2023-05-04 DIAGNOSIS — M6281 Muscle weakness (generalized): Secondary | ICD-10-CM | POA: Diagnosis not present

## 2023-05-04 DIAGNOSIS — H903 Sensorineural hearing loss, bilateral: Secondary | ICD-10-CM | POA: Diagnosis not present

## 2023-05-05 IMAGING — XA IR EXCHANGE BILARY DRAIN
5 series · 13 of 14 positions shown · non-contrast
Comparison: none

INDICATION: Malfunctioning cholecystostomy tube, leaking with flushes

[Series 1: fl neuro · 1 of 1 slices shown (1 of 5)]
[im 1/1]
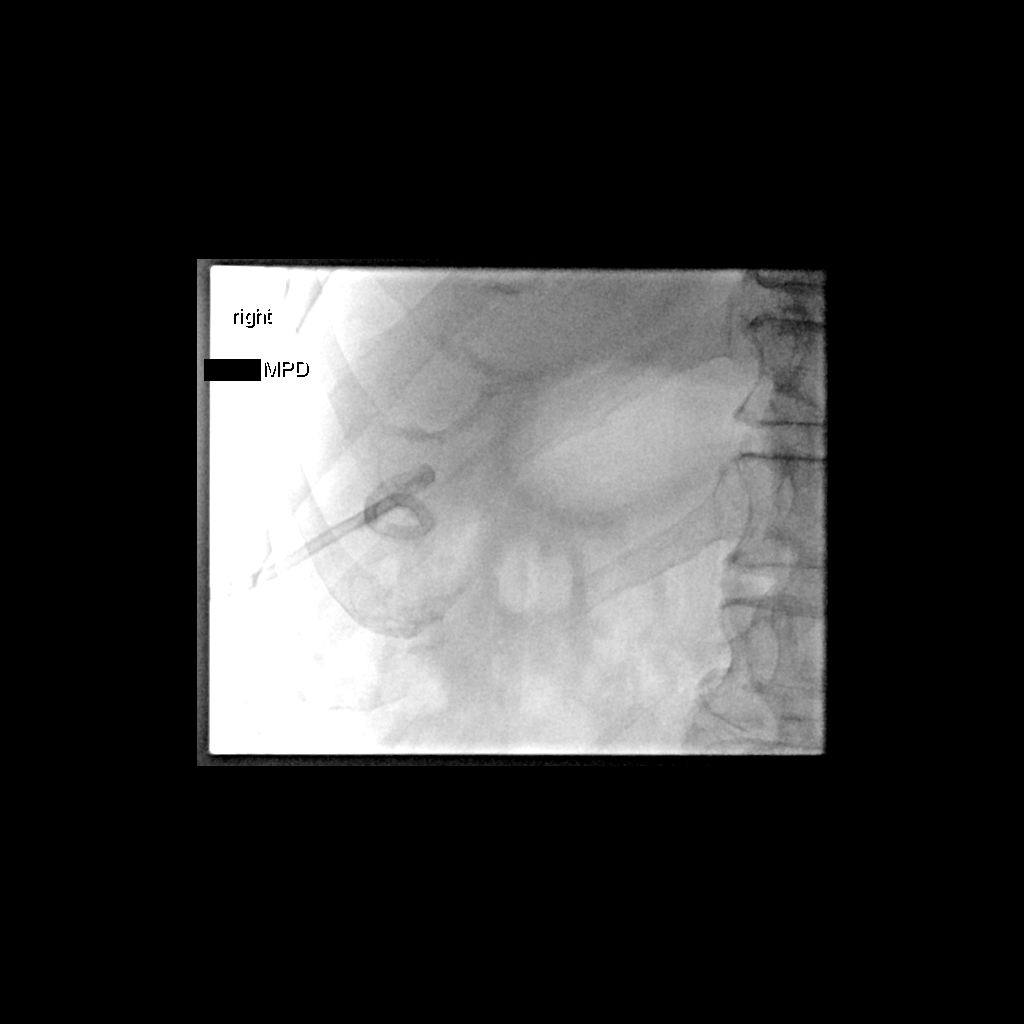

[Series 2: fl neuro · 4 of 47 frames shown (2 of 5)]
[frame 1/47]
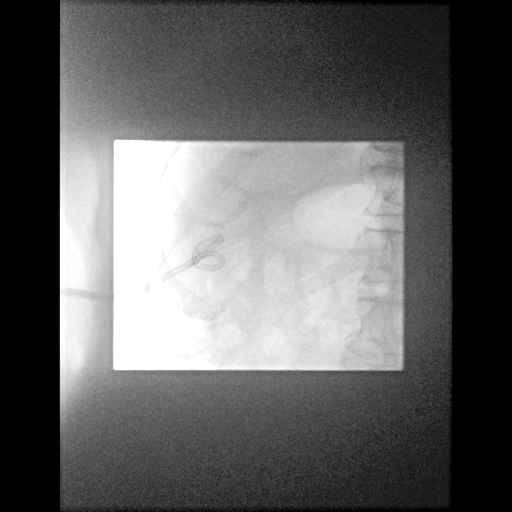
[frame 8/47]
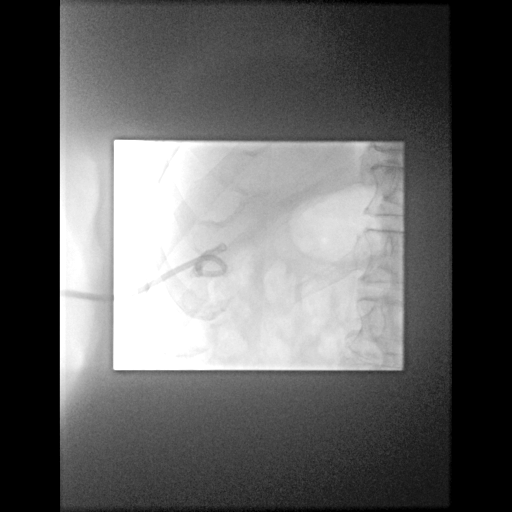
[frame 24/47]
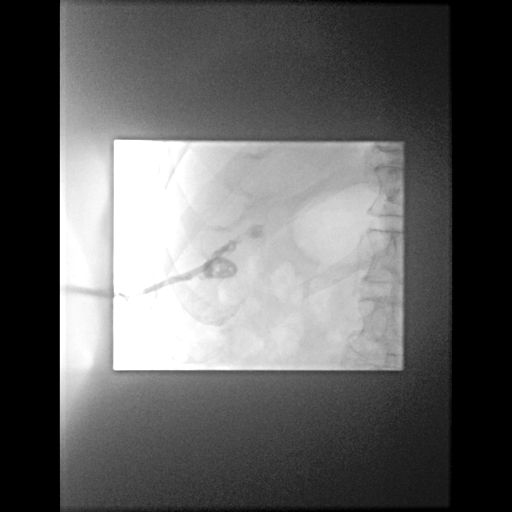
[frame 40/47]
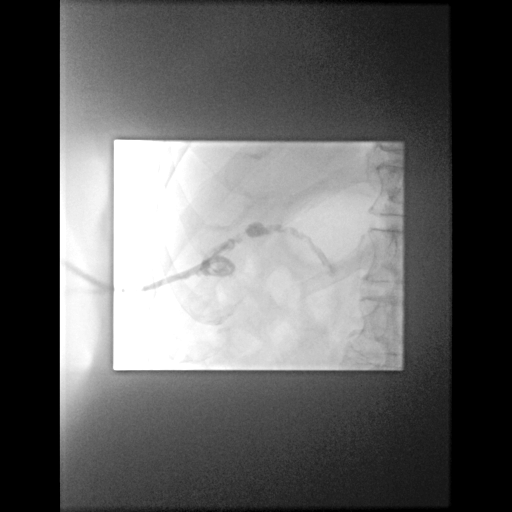

[Series 3: fl neuro · 3 of 38 frames shown (3 of 5)]
[frame 1/38]
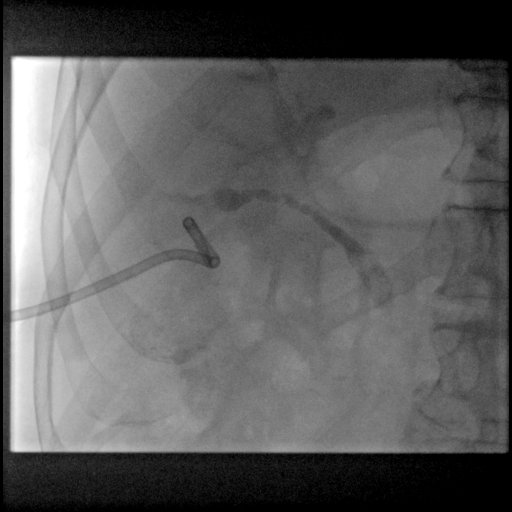
[frame 20/38]
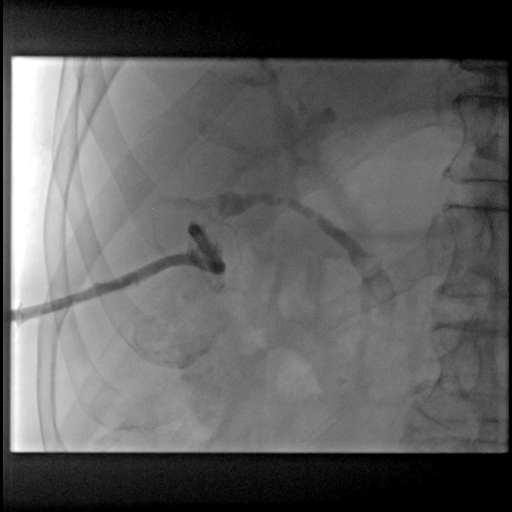
[frame 33/38]
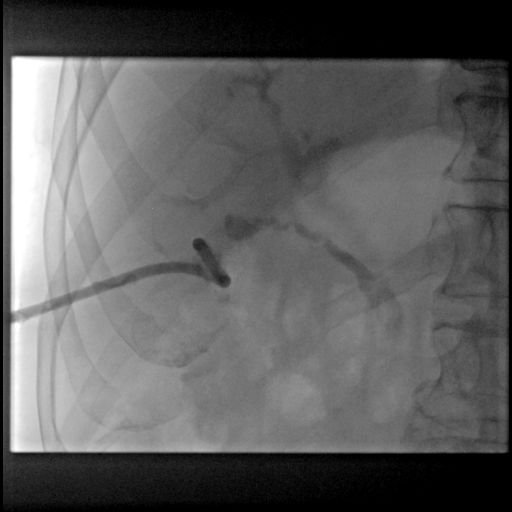

[Series 4: fl neuro · 4 of 105 frames shown (4 of 5)]
[frame 1/105]
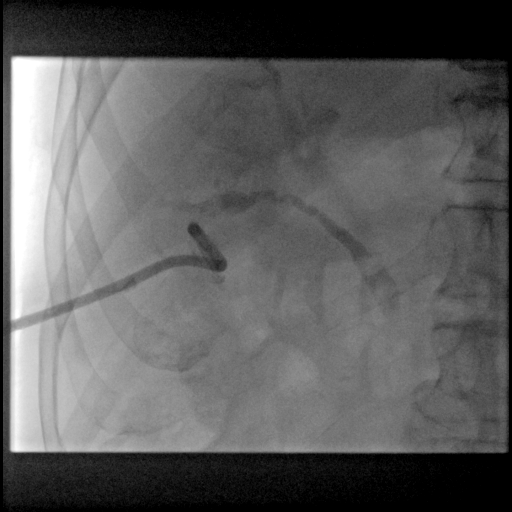
[frame 16/105]
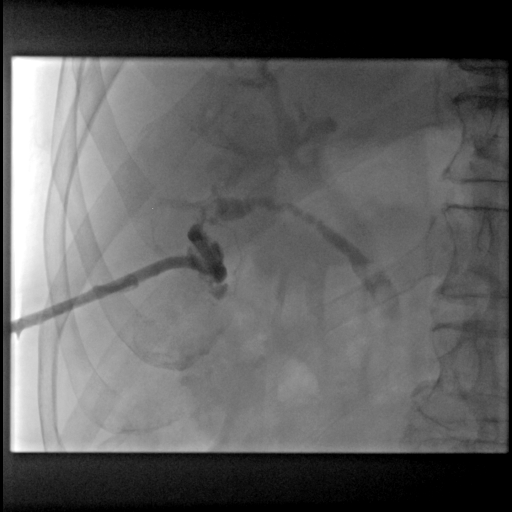
[frame 53/105]
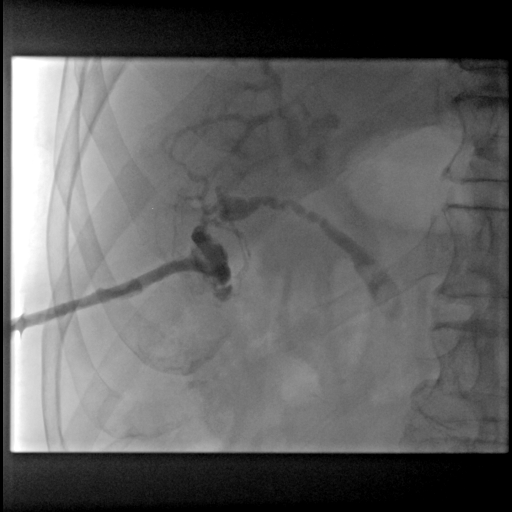
[frame 90/105]
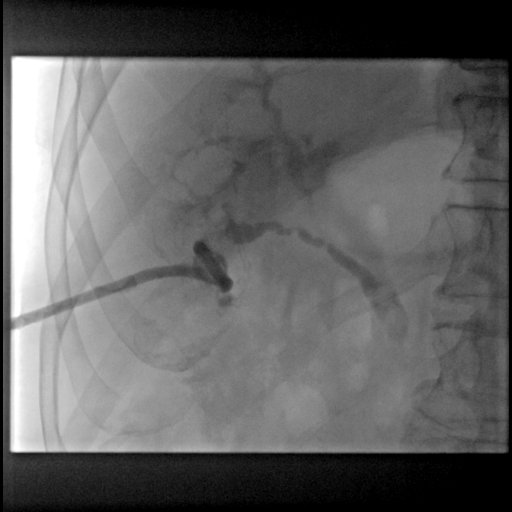

[Series 5: fl neuro · 1 of 1 slices shown (5 of 5)]
[im 1/1]
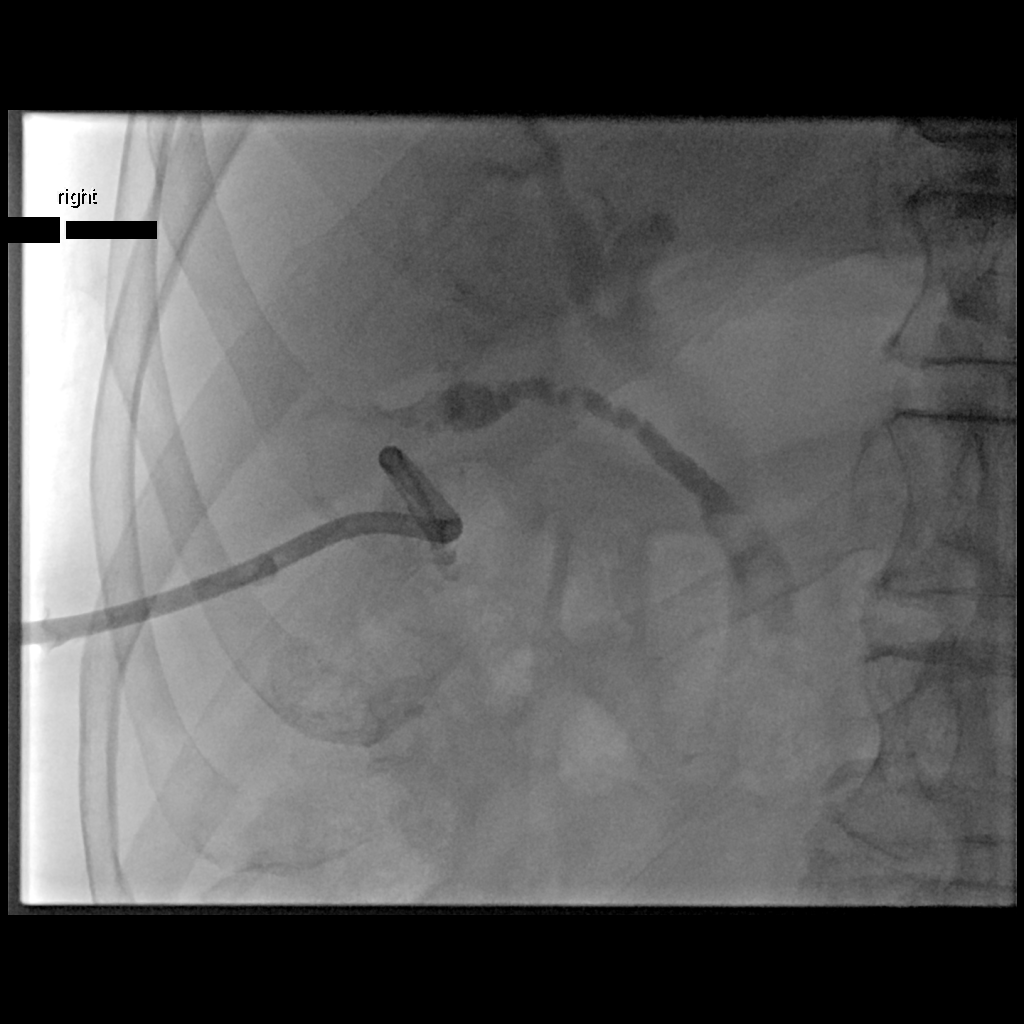

[13 of 14 positions shown; findings below may reference images not displayed]

EXAM:
Exchange of cholecystostomy tube using fluoroscopy

MEDICATIONS:
None

ANESTHESIA/SEDATION:
Local analgesia

FLUOROSCOPY:
Radiation Exposure Index (as provided by the fluoroscopic device):
5.2 minutes (149 mGy)

COMPLICATIONS:
None immediate.

PROCEDURE:
Informed written consent was obtained from the patient after a
thorough discussion of the procedural risks, benefits and
alternatives. All questions were addressed. Maximal Sterile Barrier
Technique was utilized including caps, mask, sterile gowns, sterile
gloves, sterile drape, hand hygiene and skin antiseptic. A timeout
was performed prior to the initiation of the procedure.

The patient was placed supine on the exam table. The right upper
abdomen was prepped and draped in a standard sterile fashion with
inclusion of the existing percutaneous cholecystostomy tube within
the sterile field. Initial injection of the cholecystostomy tube
demonstrated a decompressed gallbladder. There is reflux of contrast
material along the percutaneous tract. The cystic duct was found to
be patent. There are filling defects in the distal common bile duct
suggestive of choledocholithiasis. Over an Amplatz wire, the locking
loop was released and the existing cholecystostomy tube was removed.
Initial attempts were placed with a similar 12 French multipurpose
drainage catheter were unsuccessful due to small size/decompressed
lumen of the gallbladder, as the loop was unable to be formed and
there continued to the reflux of contrast material along the
percutaneous tract. Decision was then made to place a 12 Noemi
Hing Wa Alfiyah drainage catheter. The loop was able to be better
formed, however there remain reflux of contrast material. The
drainage catheter was ultimately secured to the skin using silk
suture and a dressing. It was attached to bag drainage. The patient
tolerated the procedure well without immediate complication.
IMPRESSION: 1. Successful exchange of existing cholecystostomy tube for a new 12
Bhavik Almendares drainage catheter. Cholecystostomy tube
attached to bag drainage.
2. Attempts to resolve pericatheter leakage with flushing was
unsuccessful, and is likely due to small size/decompressed lumen of
the gallbladder. The patient is scheduled for upcoming
cholecystectomy. Patient advised to refrain from flushing the
cholecystostomy tube until surgery.
3. Study of the extrahepatic biliary ducts demonstrates a patent
cystic duct with several small filling defects in the distal CBD
suggestive of choledocholithiasis. These findings were communicated
to the managing surgeon via EMR message.

## 2023-05-06 DIAGNOSIS — M6281 Muscle weakness (generalized): Secondary | ICD-10-CM | POA: Diagnosis not present

## 2023-05-06 DIAGNOSIS — R2689 Other abnormalities of gait and mobility: Secondary | ICD-10-CM | POA: Diagnosis not present

## 2023-05-11 DIAGNOSIS — M6281 Muscle weakness (generalized): Secondary | ICD-10-CM | POA: Diagnosis not present

## 2023-05-11 DIAGNOSIS — R2689 Other abnormalities of gait and mobility: Secondary | ICD-10-CM | POA: Diagnosis not present

## 2023-05-11 MED ORDER — PRAVASTATIN SODIUM 40 MG PO TABS: 40.0000 mg | ORAL_TABLET | Freq: Every evening | ORAL | 6 refills | Status: DC

## 2023-05-11 NOTE — Telephone Encounter (Signed)
Returned call to patient, spoke to his wife (ok per DPR) they are agreeable to medication change. Rx to pharm and labs mailed to patient   Per Dr. Duke Salvia,   "Let's see if he does better on pravastatin 40mg , which is less likely to cause symptoms.  If he tolerates it, check lipids/CMP in 2-3 months.  If symptoms recur let us know and we will try a non-statin option.  TCR"

## 2023-05-13 DIAGNOSIS — R2689 Other abnormalities of gait and mobility: Secondary | ICD-10-CM | POA: Diagnosis not present

## 2023-05-13 DIAGNOSIS — M6281 Muscle weakness (generalized): Secondary | ICD-10-CM | POA: Diagnosis not present

## 2023-05-18 DIAGNOSIS — K219 Gastro-esophageal reflux disease without esophagitis: Secondary | ICD-10-CM | POA: Diagnosis not present

## 2023-05-18 DIAGNOSIS — E7800 Pure hypercholesterolemia, unspecified: Secondary | ICD-10-CM | POA: Diagnosis not present

## 2023-05-18 DIAGNOSIS — E1151 Type 2 diabetes mellitus with diabetic peripheral angiopathy without gangrene: Secondary | ICD-10-CM | POA: Diagnosis not present

## 2023-05-18 DIAGNOSIS — J449 Chronic obstructive pulmonary disease, unspecified: Secondary | ICD-10-CM | POA: Diagnosis not present

## 2023-05-18 DIAGNOSIS — M6281 Muscle weakness (generalized): Secondary | ICD-10-CM | POA: Diagnosis not present

## 2023-05-18 DIAGNOSIS — I110 Hypertensive heart disease with heart failure: Secondary | ICD-10-CM | POA: Diagnosis not present

## 2023-05-18 DIAGNOSIS — R2689 Other abnormalities of gait and mobility: Secondary | ICD-10-CM | POA: Diagnosis not present

## 2023-05-18 DIAGNOSIS — I10 Essential (primary) hypertension: Secondary | ICD-10-CM | POA: Diagnosis not present

## 2023-05-18 DIAGNOSIS — N400 Enlarged prostate without lower urinary tract symptoms: Secondary | ICD-10-CM | POA: Diagnosis not present

## 2023-05-18 DIAGNOSIS — I480 Paroxysmal atrial fibrillation: Secondary | ICD-10-CM | POA: Diagnosis not present

## 2023-05-21 DIAGNOSIS — R2689 Other abnormalities of gait and mobility: Secondary | ICD-10-CM | POA: Diagnosis not present

## 2023-05-21 DIAGNOSIS — M6281 Muscle weakness (generalized): Secondary | ICD-10-CM | POA: Diagnosis not present

## 2023-05-25 DIAGNOSIS — M6281 Muscle weakness (generalized): Secondary | ICD-10-CM | POA: Diagnosis not present

## 2023-05-25 DIAGNOSIS — R2689 Other abnormalities of gait and mobility: Secondary | ICD-10-CM | POA: Diagnosis not present

## 2023-05-27 DIAGNOSIS — M6281 Muscle weakness (generalized): Secondary | ICD-10-CM | POA: Diagnosis not present

## 2023-05-27 DIAGNOSIS — R2689 Other abnormalities of gait and mobility: Secondary | ICD-10-CM | POA: Diagnosis not present

## 2023-06-01 DIAGNOSIS — R2689 Other abnormalities of gait and mobility: Secondary | ICD-10-CM | POA: Diagnosis not present

## 2023-06-01 DIAGNOSIS — M6281 Muscle weakness (generalized): Secondary | ICD-10-CM | POA: Diagnosis not present

## 2023-06-03 DIAGNOSIS — M6281 Muscle weakness (generalized): Secondary | ICD-10-CM | POA: Diagnosis not present

## 2023-06-03 DIAGNOSIS — R8279 Other abnormal findings on microbiological examination of urine: Secondary | ICD-10-CM | POA: Diagnosis not present

## 2023-06-03 DIAGNOSIS — R2689 Other abnormalities of gait and mobility: Secondary | ICD-10-CM | POA: Diagnosis not present

## 2023-06-03 DIAGNOSIS — R350 Frequency of micturition: Secondary | ICD-10-CM | POA: Diagnosis not present

## 2023-06-03 DIAGNOSIS — R351 Nocturia: Secondary | ICD-10-CM | POA: Diagnosis not present

## 2023-06-03 DIAGNOSIS — N401 Enlarged prostate with lower urinary tract symptoms: Secondary | ICD-10-CM | POA: Diagnosis not present

## 2023-06-08 DIAGNOSIS — R2689 Other abnormalities of gait and mobility: Secondary | ICD-10-CM | POA: Diagnosis not present

## 2023-06-08 DIAGNOSIS — M6281 Muscle weakness (generalized): Secondary | ICD-10-CM | POA: Diagnosis not present

## 2023-06-09 ENCOUNTER — Telehealth: Payer: Self-pay | Admitting: Cardiovascular Disease

## 2023-06-09 DIAGNOSIS — R609 Edema, unspecified: Secondary | ICD-10-CM

## 2023-06-09 NOTE — Telephone Encounter (Signed)
Pt c/o swelling: STAT is pt has developed SOB within 24 hours  How much weight have you gained and in what time span? 13lbs since May 5th  If swelling, where is the swelling located? Right leg  Are you currently taking a fluid pill? yes  Are you currently SOB? no  Do you have a log of your daily weights (if so, list)?    Have you gained 3 pounds in a day or 5 pounds in a week? no  Have you traveled recently? no

## 2023-06-09 NOTE — Telephone Encounter (Signed)
Spoke with wife regarding swelling She does not weigh daily however on 5/5 wt was 174, yesterday 187 PT brought to attention right leg tight and swollen  Denies pain, being warm/hot to touch, or discoloration Both legs feel achy when he walks, which he has having difficulty doing Does not elevate legs when sitting. They have decreased his salk intake recently, had been using more.  No change in breathing,  Unsure if swelling goes down at night.  Does not take diuretics   Reviewed with wife patient needs to eat a low salt diet. Salt makes the body hold onto extra fluid which causes swelling. Sit with legs elevated. For example, in the recliner or on an ottoman.  She will start weighing patient daily first thing in am after emptying bladder  Will forward to Ronn Melena NP for review

## 2023-06-10 DIAGNOSIS — I480 Paroxysmal atrial fibrillation: Secondary | ICD-10-CM | POA: Diagnosis not present

## 2023-06-10 DIAGNOSIS — I110 Hypertensive heart disease with heart failure: Secondary | ICD-10-CM | POA: Diagnosis not present

## 2023-06-10 DIAGNOSIS — N400 Enlarged prostate without lower urinary tract symptoms: Secondary | ICD-10-CM | POA: Diagnosis not present

## 2023-06-10 DIAGNOSIS — K219 Gastro-esophageal reflux disease without esophagitis: Secondary | ICD-10-CM | POA: Diagnosis not present

## 2023-06-10 DIAGNOSIS — J449 Chronic obstructive pulmonary disease, unspecified: Secondary | ICD-10-CM | POA: Diagnosis not present

## 2023-06-10 DIAGNOSIS — I10 Essential (primary) hypertension: Secondary | ICD-10-CM | POA: Diagnosis not present

## 2023-06-10 DIAGNOSIS — R2689 Other abnormalities of gait and mobility: Secondary | ICD-10-CM | POA: Diagnosis not present

## 2023-06-10 DIAGNOSIS — M6281 Muscle weakness (generalized): Secondary | ICD-10-CM | POA: Diagnosis not present

## 2023-06-10 DIAGNOSIS — E1151 Type 2 diabetes mellitus with diabetic peripheral angiopathy without gangrene: Secondary | ICD-10-CM | POA: Diagnosis not present

## 2023-06-10 DIAGNOSIS — F319 Bipolar disorder, unspecified: Secondary | ICD-10-CM | POA: Diagnosis not present

## 2023-06-10 DIAGNOSIS — E7800 Pure hypercholesterolemia, unspecified: Secondary | ICD-10-CM | POA: Diagnosis not present

## 2023-06-10 MED ORDER — FUROSEMIDE 20 MG PO TABS: ORAL_TABLET | ORAL | 0 refills | Status: DC

## 2023-06-10 NOTE — Telephone Encounter (Addendum)
Advised wife, verbalized understanding  Rx for Lasix sent to [facility] and lab orders mailed  Dr Duke Salvia out of the office this week and next week. Ronn Melena NP with no availabilities  Patient has appointment with PCP Tuesday, advised to call and see if can be seen sooner

## 2023-06-10 NOTE — Telephone Encounter (Signed)
As leg swelling one sided, recommend office evaluation with [place] or PCP.  If significantly bothersome, recommend urgent care evaluation.  Agree with recommendations for low salt, leg elevation.   May Rx Lasix 20mg  QD x 3 days with BMP, BNP, CBC in 1 week (if not collected sooner at [facility]).   Labs via [facility] 03/31/23 creatinine 1.05, K 4.1  Alver Sorrow, NP

## 2023-06-14 DIAGNOSIS — M6281 Muscle weakness (generalized): Secondary | ICD-10-CM | POA: Diagnosis not present

## 2023-06-14 DIAGNOSIS — R2689 Other abnormalities of gait and mobility: Secondary | ICD-10-CM | POA: Diagnosis not present

## 2023-06-15 DIAGNOSIS — E782 Mixed hyperlipidemia: Secondary | ICD-10-CM | POA: Diagnosis not present

## 2023-06-15 DIAGNOSIS — E1151 Type 2 diabetes mellitus with diabetic peripheral angiopathy without gangrene: Secondary | ICD-10-CM | POA: Diagnosis not present

## 2023-06-15 DIAGNOSIS — Z0000 Encounter for general adult medical examination without abnormal findings: Secondary | ICD-10-CM | POA: Diagnosis not present

## 2023-06-15 DIAGNOSIS — E1142 Type 2 diabetes mellitus with diabetic polyneuropathy: Secondary | ICD-10-CM | POA: Diagnosis not present

## 2023-06-15 DIAGNOSIS — F0390 Unspecified dementia without behavioral disturbance: Secondary | ICD-10-CM | POA: Diagnosis not present

## 2023-06-15 DIAGNOSIS — Z1331 Encounter for screening for depression: Secondary | ICD-10-CM | POA: Diagnosis not present

## 2023-06-15 DIAGNOSIS — I480 Paroxysmal atrial fibrillation: Secondary | ICD-10-CM | POA: Diagnosis not present

## 2023-06-15 DIAGNOSIS — E785 Hyperlipidemia, unspecified: Secondary | ICD-10-CM | POA: Diagnosis not present

## 2023-06-15 DIAGNOSIS — D649 Anemia, unspecified: Secondary | ICD-10-CM | POA: Diagnosis not present

## 2023-06-15 DIAGNOSIS — I110 Hypertensive heart disease with heart failure: Secondary | ICD-10-CM | POA: Diagnosis not present

## 2023-06-15 DIAGNOSIS — M7989 Other specified soft tissue disorders: Secondary | ICD-10-CM | POA: Diagnosis not present

## 2023-06-15 DIAGNOSIS — E1140 Type 2 diabetes mellitus with diabetic neuropathy, unspecified: Secondary | ICD-10-CM | POA: Diagnosis not present

## 2023-06-15 DIAGNOSIS — I5042 Chronic combined systolic (congestive) and diastolic (congestive) heart failure: Secondary | ICD-10-CM | POA: Diagnosis not present

## 2023-06-15 DIAGNOSIS — F319 Bipolar disorder, unspecified: Secondary | ICD-10-CM | POA: Diagnosis not present

## 2023-06-16 DIAGNOSIS — R2689 Other abnormalities of gait and mobility: Secondary | ICD-10-CM | POA: Diagnosis not present

## 2023-06-16 DIAGNOSIS — M6281 Muscle weakness (generalized): Secondary | ICD-10-CM | POA: Diagnosis not present

## 2023-06-17 ENCOUNTER — Telehealth (HOSPITAL_BASED_OUTPATIENT_CLINIC_OR_DEPARTMENT_OTHER): Payer: Self-pay | Admitting: Cardiovascular Disease

## 2023-06-17 NOTE — Telephone Encounter (Signed)
  Pt c/o swelling: STAT is pt has developed SOB within 24 hours  If swelling, where is the swelling located?   How much weight have you gained and in what time span? 10 lbs since 06/17  Have you gained 3 pounds in a day or 5 pounds in a week?   Do you have a log of your daily weights (if so, list)? 06/17 171 lbs, 06/19 175 lbs, 06/20 181 lbs    Are you currently taking a fluid pill? Yes   Are you currently SOB?   Have you traveled recently?   Pt's wife said, pt is continuing to gain weight even after taking extra fluid pills

## 2023-06-17 NOTE — Telephone Encounter (Signed)
Called patient's wife about message. She stated patient has gained 10 lbs in 3 days. She stated patient is having swelling and pain in his right leg. He denies any SOB or chest pain. Patient saw his PCP that ordered more lasix and told patient to call them if he was having to take it more then 3 days in a row. Patient's wife has put in a call to PCP, but has not heard back. Patient also has ultrasound scheduled tomorrow morning at his PCP's office. Made patient an appointment with DOD tomorrow afternoon, after test, to get evaluated. Patient's wife agreed to plan.

## 2023-06-18 ENCOUNTER — Encounter (HOSPITAL_BASED_OUTPATIENT_CLINIC_OR_DEPARTMENT_OTHER): Payer: Self-pay | Admitting: Cardiology

## 2023-06-18 ENCOUNTER — Ambulatory Visit (HOSPITAL_BASED_OUTPATIENT_CLINIC_OR_DEPARTMENT_OTHER): Payer: Medicare HMO | Admitting: Cardiology

## 2023-06-18 VITALS — BP 124/63 | HR 78 | Ht 73.0 in | Wt 179.9 lb

## 2023-06-18 DIAGNOSIS — I480 Paroxysmal atrial fibrillation: Secondary | ICD-10-CM | POA: Diagnosis not present

## 2023-06-18 DIAGNOSIS — R609 Edema, unspecified: Secondary | ICD-10-CM

## 2023-06-18 DIAGNOSIS — Z8679 Personal history of other diseases of the circulatory system: Secondary | ICD-10-CM | POA: Diagnosis not present

## 2023-06-18 DIAGNOSIS — R635 Abnormal weight gain: Secondary | ICD-10-CM

## 2023-06-18 DIAGNOSIS — M7989 Other specified soft tissue disorders: Secondary | ICD-10-CM | POA: Diagnosis not present

## 2023-06-18 MED ORDER — FUROSEMIDE 20 MG PO TABS: ORAL_TABLET | ORAL | 1 refills | Status: DC

## 2023-06-18 NOTE — Patient Instructions (Addendum)
Take the lasix once per day until weight returns back to average weight (175 lbs). Once you reach 175 lbs, can take as needed for weight gain of 3 lbs overnight or up 5 lbs from baseline.  Keep follow up as planned

## 2023-06-18 NOTE — Progress Notes (Signed)
Cardiology Office Note:  .   Date:  06/18/2023  ID:  Douglas Iha., DOB Apr 27, 1941, MRN [id number] PCP: Emilio Aspen, MD  [facility] Providers Cardiologist:  Chilton Si, MD {  History of Present Illness: Douglas Iha. is a 82 y.o. male with PMH atrial fibrillation, hypertension, hyperlipidemia, type II diabetes  Pertinent CV history: most recent echo 2023 EF 60-65%, prior 40-45%  Today: Seen as an urgent visit for 10 lbs weight gain, one sided leg swelling. See call by Gillian Shields 06/10/23, started lasix 20 mg x 3 days. Labs ordered, have not been performed here but reviewed from recent PCP visit.  Has ultrasound done on R leg earlier today at [facility], awaiting results. Labs from [facility] reviewed, white count normal, hgb/hct 12.8/39.4. BMET with Cr 1.16, Na 38, K 4.7, albumin 3.9  Normal weight 171 lbs (as of 6/18), yesterday 6/20 got up to 181 lbs. This AM was 179 lbs. Has had watermelon with salt on it the last few days. Reviewed salt restriction recommendations.  Took lasix with some improvement. Told to take two lasix daily by PCP but didn't notice that this was much different that taking one lasix daily. Breathing unchanged. No PND or orthopnea.  ROS: Denies chest pain, shortness of breath at rest or with normal exertion. No PND, orthopnea. No syncope or palpitations. ROS otherwise negative except as noted.   Studies Reviewed: Marland Kitchen    EKG:     not ordered today  Physical Exam:   VS:  BP 124/63 Comment: home  Pulse 78   Ht 6\' 1"  (1.854 m)   Wt 179 lb 14.4 oz (81.6 kg)   BMI 23.73 kg/m    Wt Readings from Last 3 Encounters:  06/18/23 179 lb 14.4 oz (81.6 kg)  04/14/23 167 lb (75.8 kg)  03/29/23 178 lb 8 oz (81 kg)    GEN: Well nourished, well developed in no acute distress HEENT: Normal, moist mucous membranes NECK: No JVD CARDIAC: regular rhythm, normal S1 and S2, no rubs or gallops. No murmur. VASCULAR: Radial and DP pulses 2+  bilaterally. No carotid bruits RESPIRATORY:  distant breath sounds but clear to auscultation without rales, wheezing or rhonchi  ABDOMEN: Soft, non-tender, non-distended MUSCULOSKELETAL:  Ambulates independently SKIN: Warm and dry. Bilateral mild edema to mid calf with sock lines, not firm/pitting. No warmth, redness, or tenderness NEUROLOGIC:  Alert and oriented x 3. No focal neuro deficits noted. PSYCHIATRIC:  Normal affect    ASSESSMENT AND PLAN: .   LE edema Weight gain -has history of paroxysmal atrial fibrillation but in sinus by exam today -has history of cardiomyopathy but recent echo with preserved EF, no JVD today -does admit to dietary/salt indiscretion recently -had u/s this AM at [facility] but also on apixaban long term, low suspicion for DVT given exam but they will send ultrasound results when they have them -reviewed labs from PCP, stable -discussed guidelines for lasix use today: Take the lasix once per day until weight returns back to average weight (175 lbs). Once you reach 175 lbs, can take as needed for weight gain of 3 lbs overnight or up 5 lbs from baseline. -they will contact us if symptoms worse or fail to improve   Signed, Jodelle Red, MD   Jodelle Red, MD, PhD, [facility]  [facility]  [place]  [facility] & Vascular at [facility] [address] [place], [place] [address] [phone]   

## 2023-06-22 ENCOUNTER — Encounter (HOSPITAL_BASED_OUTPATIENT_CLINIC_OR_DEPARTMENT_OTHER): Payer: Self-pay

## 2023-06-22 DIAGNOSIS — R2689 Other abnormalities of gait and mobility: Secondary | ICD-10-CM | POA: Diagnosis not present

## 2023-06-22 DIAGNOSIS — M6281 Muscle weakness (generalized): Secondary | ICD-10-CM | POA: Diagnosis not present

## 2023-06-22 NOTE — Telephone Encounter (Signed)
Per patient AVS,   "Take the lasix once per day until weight returns back to average weight (175 lbs). Once you reach 175 lbs, can take as needed for weight gain of 3 lbs overnight or up 5 lbs from baseline."    Reviewed above recommendations and explained that holding onto fluid is part of heart failure. Wife states they didn't notice any of these swelling issues until he was switched from atorvastatin to pravastatin and would like to know if this could be causing problems. She requested that team review his recent DOD visit and concerns.

## 2023-06-22 NOTE — Telephone Encounter (Signed)
Change in statin should not be contributing to lower extremity swelling. Looks like salt indiscretion was noted during recent visit which likely contributed to swelling. I cannot see recent lower extremity ultrasound results from [facility].

## 2023-06-22 NOTE — Telephone Encounter (Signed)
Patient's wife is calling back to go over the medication patient was taken off of for fluid. Requesting call back to discuss.

## 2023-06-23 NOTE — Telephone Encounter (Signed)
Returned call to patient and spouse, reviewed the following recommendations, patient and spouse verbalized understanding.     "Change in statin should not be contributing to lower extremity swelling. Looks like salt indiscretion was noted during recent visit which likely contributed to swelling. I cannot see recent lower extremity ultrasound results from [facility]."

## 2023-06-24 DIAGNOSIS — R2689 Other abnormalities of gait and mobility: Secondary | ICD-10-CM | POA: Diagnosis not present

## 2023-06-24 DIAGNOSIS — M6281 Muscle weakness (generalized): Secondary | ICD-10-CM | POA: Diagnosis not present

## 2023-06-29 DIAGNOSIS — M6281 Muscle weakness (generalized): Secondary | ICD-10-CM | POA: Diagnosis not present

## 2023-06-29 DIAGNOSIS — R2689 Other abnormalities of gait and mobility: Secondary | ICD-10-CM | POA: Diagnosis not present

## 2023-07-01 ENCOUNTER — Other Ambulatory Visit (HOSPITAL_BASED_OUTPATIENT_CLINIC_OR_DEPARTMENT_OTHER): Payer: Self-pay | Admitting: Family

## 2023-07-02 NOTE — Telephone Encounter (Signed)
Prescription refill request for Eliquis received. Indication:afib Last office visit:6/24 Scr:1.16   6/24 Age: 82 Weight:81.6  kg  Prescription refilled

## 2023-07-06 DIAGNOSIS — M6281 Muscle weakness (generalized): Secondary | ICD-10-CM | POA: Diagnosis not present

## 2023-07-06 DIAGNOSIS — R2689 Other abnormalities of gait and mobility: Secondary | ICD-10-CM | POA: Diagnosis not present

## 2023-07-08 DIAGNOSIS — R2689 Other abnormalities of gait and mobility: Secondary | ICD-10-CM | POA: Diagnosis not present

## 2023-07-08 DIAGNOSIS — M6281 Muscle weakness (generalized): Secondary | ICD-10-CM | POA: Diagnosis not present

## 2023-07-13 DIAGNOSIS — M6281 Muscle weakness (generalized): Secondary | ICD-10-CM | POA: Diagnosis not present

## 2023-07-13 DIAGNOSIS — R2689 Other abnormalities of gait and mobility: Secondary | ICD-10-CM | POA: Diagnosis not present

## 2023-07-15 DIAGNOSIS — M6281 Muscle weakness (generalized): Secondary | ICD-10-CM | POA: Diagnosis not present

## 2023-07-15 DIAGNOSIS — R2689 Other abnormalities of gait and mobility: Secondary | ICD-10-CM | POA: Diagnosis not present

## 2023-07-20 DIAGNOSIS — M6281 Muscle weakness (generalized): Secondary | ICD-10-CM | POA: Diagnosis not present

## 2023-07-20 DIAGNOSIS — R2689 Other abnormalities of gait and mobility: Secondary | ICD-10-CM | POA: Diagnosis not present

## 2023-07-21 DIAGNOSIS — R600 Localized edema: Secondary | ICD-10-CM | POA: Diagnosis not present

## 2023-07-22 DIAGNOSIS — M6281 Muscle weakness (generalized): Secondary | ICD-10-CM | POA: Diagnosis not present

## 2023-07-22 DIAGNOSIS — R2689 Other abnormalities of gait and mobility: Secondary | ICD-10-CM | POA: Diagnosis not present

## 2023-07-23 DIAGNOSIS — H43813 Vitreous degeneration, bilateral: Secondary | ICD-10-CM | POA: Diagnosis not present

## 2023-07-23 DIAGNOSIS — Z961 Presence of intraocular lens: Secondary | ICD-10-CM | POA: Diagnosis not present

## 2023-07-23 DIAGNOSIS — E119 Type 2 diabetes mellitus without complications: Secondary | ICD-10-CM | POA: Diagnosis not present

## 2023-07-24 ENCOUNTER — Other Ambulatory Visit (HOSPITAL_BASED_OUTPATIENT_CLINIC_OR_DEPARTMENT_OTHER): Payer: Self-pay | Admitting: Cardiology

## 2023-07-26 DIAGNOSIS — R2689 Other abnormalities of gait and mobility: Secondary | ICD-10-CM | POA: Diagnosis not present

## 2023-07-26 DIAGNOSIS — M6281 Muscle weakness (generalized): Secondary | ICD-10-CM | POA: Diagnosis not present

## 2023-07-26 NOTE — Telephone Encounter (Signed)
Rx request sent to pharmacy.  

## 2023-07-29 DIAGNOSIS — M6281 Muscle weakness (generalized): Secondary | ICD-10-CM | POA: Diagnosis not present

## 2023-07-29 DIAGNOSIS — R2689 Other abnormalities of gait and mobility: Secondary | ICD-10-CM | POA: Diagnosis not present

## 2023-07-30 ENCOUNTER — Encounter (HOSPITAL_BASED_OUTPATIENT_CLINIC_OR_DEPARTMENT_OTHER): Payer: Self-pay | Admitting: Family

## 2023-07-30 ENCOUNTER — Ambulatory Visit (HOSPITAL_BASED_OUTPATIENT_CLINIC_OR_DEPARTMENT_OTHER): Payer: Medicare HMO | Admitting: Family

## 2023-07-30 VITALS — BP 122/64 | HR 94 | Ht 73.0 in | Wt 186.0 lb

## 2023-07-30 DIAGNOSIS — D6859 Other primary thrombophilia: Secondary | ICD-10-CM

## 2023-07-30 DIAGNOSIS — I714 Abdominal aortic aneurysm, without rupture, unspecified: Secondary | ICD-10-CM

## 2023-07-30 DIAGNOSIS — I5032 Chronic diastolic (congestive) heart failure: Secondary | ICD-10-CM | POA: Diagnosis not present

## 2023-07-30 DIAGNOSIS — I480 Paroxysmal atrial fibrillation: Secondary | ICD-10-CM

## 2023-07-30 NOTE — Patient Instructions (Signed)
Medication Instructions:  Your physician recommends that you continue on your current medications as directed. Please refer to the Current Medication list given to you today.  *If you need a refill on your cardiac medications before your next appointment, please call your pharmacy*   Lab Work: Your physician recommends that you return for lab work today- BMP and BNP  If you have labs (blood work) drawn today and your tests are completely normal, you will receive your results only by: MyChart Message (if you have MyChart) OR A paper copy in the mail If you have any lab test that is abnormal or we need to change your treatment, we will call you to review the results.  Follow-Up: At [facility], you and your health needs are our priority.  As part of our continuing mission to provide you with exceptional heart care, we have created designated Provider Care Teams.  These Care Teams include your primary Cardiologist (physician) and Advanced Practice Providers (APPs -  Physician Assistants and Nurse Practitioners) who all work together to provide you with the care you need, when you need it.  We recommend signing up for the patient portal called "MyChart".  Sign up information is provided on this After Visit Summary.  MyChart is used to connect with patients for Virtual Visits (Telemedicine).  Patients are able to view lab/test results, encounter notes, upcoming appointments, etc.  Non-urgent messages can be sent to your provider as well.   To learn more about what you can do with MyChart, go to [email or web address].    Your next appointment:   3-4 months with Dr. Cristal Deer

## 2023-07-30 NOTE — Progress Notes (Signed)
Cardiology Office Note:  .   Date:  08/05/2023  ID:  Douglas Iha., DOB [birth year], MRN [id number] PCP: Emilio Aspen, MD  [facility] Providers Cardiologist:  Chilton Si, MD    History of Present Illness: Douglas Iha. is a 82 y.o. male with a hx of HTN, DM 2, hyperlipidemia, GERD, BPH, COPD, AAA, heart failure, PAF, cholecystitis.   Presented to the ED 04/25/2022 with RUQ pain diagnosed with acute cholecystitis with 1.2 mm gallstone in the neck of the gallbladder and 5 mm CBD stone with proximal biliary dilation.  Treated with IV fluid, Zosyn.  General surgery and GI recommended cholecystostomy tube for management.  Developed new onset atrial for with RVR with depressed LVEF 40-45%.  He had hypotension on metoprolol and was transition to amiodarone.  Subsequently converted to NSR.  He was discharged home 5/23.  Has since established with home health PT. Repeat echocardiogram 05/12/2022 with LVEF 60 to 65%, no RWMA, grade 1 diastolic dysfunction, RV normal size and function, trivial MR/AI, mild dilation ascending aorta 40 mm   07/08/22 laparoscopic cholecystectomy with laparoscopic common duct exploration which failed to clear duct. Repeat ERCP unsuccessful. Referred to [facility] and underwent advanced endoscopy via transduodenal approach 07/30/22. Admitted 07/30/22-09/08/22 with severe abdominal pain and found to have pancreatitis. Course complicated by atrial fibrillation with RVR, pseudocyst formation, subhepatic fluid collection requiring CT guided drain placement. Drain removed 09/16/22. 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   Seen 06/10/23 started on Lasix 20mg  every day x 3 days for volume overload. Last seen by Dr. Cristal Deer 06/18/23 as work in for LE edema. He was recommended to take Lasix until weight decreased from 179lbs to dry weight 175 lbs then return to PRN.    Pleasant gentleman who  presents today for follow-up with his wife. Presently two of his Lasix daily for over a week as had gained 5 pounds overnight. Per his wife's report appetite has improved and wonders if weight gain is healthy weight gain rather than fluid. He reports a little dyspnea particularly with activity. Stable swelling in his right leg which is better than previous. Participating in PT twice per week.   06/2023 creatinine 1.2 GFR 56  ROS: Please see the history of present illness.    All other systems reviewed and are negative.   Studies Reviewed: .        Cardiac Studies & Procedures       ECHOCARDIOGRAM  ECHOCARDIOGRAM COMPLETE 05/12/2022  Narrative ECHOCARDIOGRAM REPORT    Patient Name:   Douglas Hurst. Date of Exam: 05/12/2022 Medical Rec #:  [id number]          Height:       74.0 in Accession #:    [id number]         Weight:       186.9 lb Date of Birth:  [birth year]         BSA:          2.112 m Patient Age:    80 years           BP:           143/80 mmHg Patient Gender: M                  HR:           56 bpm. Exam Location:  [facility]  Procedure: 2D Echo, Cardiac Doppler and Color Doppler  Indications:    I42.9 Cardiomyopathy  History:        Patient has prior history of Echocardiogram examinations, most recent 04/26/2022. CHF and Cardiomyopathy, COPD; Risk Factors:Family History of Coronary Artery Disease, Hypertension, Dyslipidemia and Former Smoker. Prior EF 40-45%.  Sonographer:    Farrel Conners RDCS Referring Phys: Darrol Jump  IMPRESSIONS   1. Left ventricular ejection fraction, by estimation, is 60 to 65%. The left ventricle has normal function. The left ventricle has no regional wall motion abnormalities. Left ventricular diastolic parameters are consistent with Grade I diastolic dysfunction (impaired relaxation). 2. Right ventricular systolic function is normal. The right ventricular size is normal. There is normal pulmonary artery systolic pressure.  The estimated right ventricular systolic pressure is 29.6 mmHg. 3. Left atrial size was mildly dilated. 4. The mitral valve is abnormal. Trivial mitral valve regurgitation. 5. The aortic valve is tricuspid. Aortic valve regurgitation is trivial. Aortic valve sclerosis is present, with no evidence of aortic valve stenosis. 6. Aortic dilatation noted. There is mild dilatation of the ascending aorta, measuring 40 mm. 7. The inferior vena cava is normal in size with greater than 50% respiratory variability, suggesting right atrial pressure of 3 mmHg.  Comparison(s): Changes from prior study are noted. 4/60/2023: LVEF 40-45%.  FINDINGS Left Ventricle: Left ventricular ejection fraction, by estimation, is 60 to 65%. The left ventricle has normal function. The left ventricle has no regional wall motion abnormalities. The left ventricular internal cavity size was normal in size. There is no left ventricular hypertrophy. Left ventricular diastolic parameters are consistent with Grade I diastolic dysfunction (impaired relaxation). Indeterminate filling pressures.  Right Ventricle: The right ventricular size is normal. No increase in right ventricular wall thickness. Right ventricular systolic function is normal. There is normal pulmonary artery systolic pressure. The tricuspid regurgitant velocity is 2.58 m/s, and with an assumed right atrial pressure of 3 mmHg, the estimated right ventricular systolic pressure is 29.6 mmHg.  Left Atrium: Left atrial size was mildly dilated.  Right Atrium: Right atrial size was normal in size.  Pericardium: There is no evidence of pericardial effusion.  Mitral Valve: The mitral valve is abnormal. Mild to moderate mitral annular calcification. Trivial mitral valve regurgitation.  Tricuspid Valve: The tricuspid valve is grossly normal. Tricuspid valve regurgitation is mild.  Aortic Valve: The aortic valve is tricuspid. Aortic valve regurgitation is trivial. Aortic  regurgitation PHT measures 372 msec. Aortic valve sclerosis is present, with no evidence of aortic valve stenosis. Aortic valve mean gradient measures 6.0 mmHg. Aortic valve peak gradient measures 13.0 mmHg. Aortic valve area, by VTI measures 2.65 cm.  Pulmonic Valve: The pulmonic valve was normal in structure. Pulmonic valve regurgitation is not visualized.  Aorta: Aortic dilatation noted. There is mild dilatation of the ascending aorta, measuring 40 mm.  Venous: The inferior vena cava is normal in size with greater than 50% respiratory variability, suggesting right atrial pressure of 3 mmHg.  IAS/Shunts: No atrial level shunt detected by color flow Doppler.   LEFT VENTRICLE PLAX 2D LVIDd:         4.75 cm   Diastology LVIDs:         2.90 cm   LV e' medial:    6.53 cm/s LV PW:         1.00 cm   LV E/e' medial:  12.5 LV IVS:        1.00 cm   LV e' lateral:  6.85 cm/s LVOT diam:     2.40 cm   LV E/e' lateral: 11.9 LV SV:         95 LV SV Index:   45 LVOT Area:     4.52 cm   RIGHT VENTRICLE RV Basal diam:  3.90 cm RV S prime:     19.20 cm/s TAPSE (M-mode): 2.2 cm  LEFT ATRIUM             Index        RIGHT ATRIUM           Index LA diam:        4.30 cm 2.04 cm/m   RA Area:     16.30 cm LA Vol (A2C):   68.0 ml 32.20 ml/m  RA Volume:   43.10 ml  20.41 ml/m LA Vol (A4C):   67.7 ml 32.05 ml/m LA Biplane Vol: 75.5 ml 35.75 ml/m AORTIC VALVE AV Area (Vmax):    2.58 cm AV Area (Vmean):   2.58 cm AV Area (VTI):     2.65 cm AV Vmax:           180.00 cm/s AV Vmean:          111.000 cm/s AV VTI:            0.359 m AV Peak Grad:      13.0 mmHg AV Mean Grad:      6.0 mmHg LVOT Vmax:         102.50 cm/s LVOT Vmean:        63.225 cm/s LVOT VTI:          0.210 m LVOT/AV VTI ratio: 0.59 AI PHT:            372 msec  AORTA Ao Root diam: 3.50 cm Ao Asc diam:  4.00 cm  MITRAL VALVE                TRICUSPID VALVE MV Area (PHT): cm          TR Peak grad:   26.6 mmHg MV Decel  Time: 226 msec     TR Vmax:        258.00 cm/s MV E velocity: 81.35 cm/s MV A velocity: 121.50 cm/s  SHUNTS MV E/A ratio:  0.67         Systemic VTI:  0.21 m Systemic Diam: 2.40 cm  Zoila Shutter MD Electronically signed by Zoila Shutter MD Signature Date/Time: 05/12/2022/8:03:43 PM    Final             Risk Assessment/Calculations:    CHA2DS2-VASc Score = 5   This indicates a 7.2% annual risk of stroke. The patient's score is based upon: CHF History: 1 HTN History: 1 Diabetes History: 1 Stroke History: 0 Vascular Disease History: 0 Age Score: 2 Gender Score: 0           Physical Exam:   VS:  BP 122/64   Pulse 94   Ht 6\' 1"  (1.854 m)   Wt 186 lb (84.4 kg)   BMI 24.54 kg/m    Wt Readings from Last 3 Encounters:  07/30/23 186 lb (84.4 kg)  06/18/23 179 lb 14.4 oz (81.6 kg)  04/14/23 167 lb (75.8 kg)    GEN: Well nourished, well developed in no acute distress NECK: No JVD; No carotid bruits CARDIAC: RRR, no murmurs, rubs, gallops RESPIRATORY:  Clear to auscultation without rales, wheezing or rhonchi  ABDOMEN: Soft, non-tender, non-distended EXTREMITIES:  No edema; No  deformity   ASSESSMENT AND PLAN: .    HFrEF - LVEF 40% in setting of sepsis with cholecystitis with new onset atrial fibrillation 03/2022. Repeat echo 04/2022 normal LVEF. Euvolemic and well compensated on exam. As LVEF recovered, no indication for ischemic evaluation. Update BMP/BNP today. Has experienced weight gain but no worsening edema nor dyspnea, suspect appropriate weight gain from increased appetite after significant illness/hospitalization last year. If BNP unremarkable, will update today's weight to his dry weight. Continue PRN Lasix for weight gain of 2 lbs overnight or 5 lbs in one week.    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 Continue BP control. Consider repeat imaging at follow up.   AAA -continue optimal BP control.  [facility] monitoring, has follow up visit in October with imaging.   HLD  - Continue Pravastatin.    PAF / Hypercoagulable state -Maintaining NSR today by auscultation. Denies palpitations.  Continue Eliquis 5 mg twice daily.  Denies bleeding complications.  Does not meet dose reduction criteria.CHA2DS2-VASc Score = 5 [CHF History: 1, HTN History: 1, Diabetes History: 1, Stroke History: 0, Vascular Disease History: 0, Age Score: 2, Gender Score: 0].  Therefore, the patient's annual risk of stroke is 7.2 %.  Continue Metoprolol to 12.5mg  BID.         Dispo: follow up 6 mos  Signed, Alver Sorrow, NP

## 2023-08-02 ENCOUNTER — Telehealth (HOSPITAL_BASED_OUTPATIENT_CLINIC_OR_DEPARTMENT_OTHER): Payer: Self-pay

## 2023-08-02 DIAGNOSIS — I5032 Chronic diastolic (congestive) heart failure: Secondary | ICD-10-CM

## 2023-08-02 DIAGNOSIS — R609 Edema, unspecified: Secondary | ICD-10-CM

## 2023-08-02 MED ORDER — FUROSEMIDE 20 MG PO TABS: 20.0000 mg | ORAL_TABLET | Freq: Every day | ORAL | 3 refills | Status: DC

## 2023-08-02 NOTE — Telephone Encounter (Addendum)
Called to discuss results, spoke with wife (ok per dpr). Agreeable with changes and repeat labs.    ----- Message from Alver Sorrow sent at 08/02/2023  8:29 AM EDT ----- BNP with no volume overload.  Kidney function decreased from previous.  Normal electrolytes.   Anticipate decreased kidney function related to increase Lasix prior to office visit.  Recommend reducing furosemide 20 mg daily with repeat BMP/BNP in 1 week.   As discussed in clinic, likely simply a higher "dry weight" but no volume overload.

## 2023-08-03 DIAGNOSIS — R2689 Other abnormalities of gait and mobility: Secondary | ICD-10-CM | POA: Diagnosis not present

## 2023-08-03 DIAGNOSIS — M6281 Muscle weakness (generalized): Secondary | ICD-10-CM | POA: Diagnosis not present

## 2023-08-05 ENCOUNTER — Other Ambulatory Visit (HOSPITAL_BASED_OUTPATIENT_CLINIC_OR_DEPARTMENT_OTHER): Payer: Self-pay | Admitting: Family

## 2023-08-05 ENCOUNTER — Encounter (HOSPITAL_BASED_OUTPATIENT_CLINIC_OR_DEPARTMENT_OTHER): Payer: Self-pay | Admitting: Family

## 2023-08-06 DIAGNOSIS — R609 Edema, unspecified: Secondary | ICD-10-CM | POA: Diagnosis not present

## 2023-08-06 DIAGNOSIS — I5032 Chronic diastolic (congestive) heart failure: Secondary | ICD-10-CM | POA: Diagnosis not present

## 2023-08-10 ENCOUNTER — Telehealth: Payer: Self-pay | Admitting: Cardiovascular Disease

## 2023-08-10 DIAGNOSIS — M6281 Muscle weakness (generalized): Secondary | ICD-10-CM | POA: Diagnosis not present

## 2023-08-10 DIAGNOSIS — R2689 Other abnormalities of gait and mobility: Secondary | ICD-10-CM | POA: Diagnosis not present

## 2023-08-10 DIAGNOSIS — I5032 Chronic diastolic (congestive) heart failure: Secondary | ICD-10-CM

## 2023-08-10 MED ORDER — SPIRONOLACTONE 25 MG PO TABS: 25.0000 mg | ORAL_TABLET | Freq: Every day | ORAL | 6 refills | Status: DC

## 2023-08-10 NOTE — Telephone Encounter (Signed)
Given persistent dypsnea, weight gain recommend updating echocardiogram.   Reduce Lasix to 20mg  daily Add Spironolactone 25mg  daily BMP in 1 week  Approx dry weight 186 lbs  Alver Sorrow, NP

## 2023-08-10 NOTE — Telephone Encounter (Signed)
Patient's wife is calling to get results. Requesting call back.

## 2023-08-10 NOTE — Telephone Encounter (Addendum)
Results called to patient who verbalizes understanding!   Wife states his weight is up to 190  7/30 185 8/7 189  8/8 190 8/9 188 8/12 & 13 190  Has been taking two furosemide's a day since his last office visit.

## 2023-08-10 NOTE — Telephone Encounter (Signed)
Returned call to patient and wife, relayed the following recommendations, they are agreeable and will look for scheduling call.    "Given persistent dypsnea, weight gain recommend updating echocardiogram.    Reduce Lasix to 20mg  daily Add Spironolactone 25mg  daily BMP in 1 week   Approx dry weight 186 lbs   Alver Sorrow, NP"

## 2023-08-10 NOTE — Addendum Note (Signed)
Addended by: Marlene Lard on: 08/10/2023 04:48 PM   Modules accepted: Orders

## 2023-08-11 ENCOUNTER — Telehealth (HOSPITAL_BASED_OUTPATIENT_CLINIC_OR_DEPARTMENT_OTHER): Payer: Self-pay | Admitting: Family

## 2023-08-11 NOTE — Telephone Encounter (Signed)
Left message for patient to call and schedule the Echocardiogram ordered by Caitlin Walker, NP 

## 2023-08-13 DIAGNOSIS — R2689 Other abnormalities of gait and mobility: Secondary | ICD-10-CM | POA: Diagnosis not present

## 2023-08-13 DIAGNOSIS — M6281 Muscle weakness (generalized): Secondary | ICD-10-CM | POA: Diagnosis not present

## 2023-08-15 ENCOUNTER — Other Ambulatory Visit (HOSPITAL_BASED_OUTPATIENT_CLINIC_OR_DEPARTMENT_OTHER): Payer: Self-pay | Admitting: Family

## 2023-08-15 ENCOUNTER — Other Ambulatory Visit: Payer: Self-pay | Admitting: Cardiovascular Disease

## 2023-08-15 DIAGNOSIS — E782 Mixed hyperlipidemia: Secondary | ICD-10-CM

## 2023-08-17 DIAGNOSIS — R2689 Other abnormalities of gait and mobility: Secondary | ICD-10-CM | POA: Diagnosis not present

## 2023-08-17 DIAGNOSIS — M6281 Muscle weakness (generalized): Secondary | ICD-10-CM | POA: Diagnosis not present

## 2023-08-20 DIAGNOSIS — R2689 Other abnormalities of gait and mobility: Secondary | ICD-10-CM | POA: Diagnosis not present

## 2023-08-20 DIAGNOSIS — M6281 Muscle weakness (generalized): Secondary | ICD-10-CM | POA: Diagnosis not present

## 2023-08-23 DIAGNOSIS — R2689 Other abnormalities of gait and mobility: Secondary | ICD-10-CM | POA: Diagnosis not present

## 2023-08-23 DIAGNOSIS — M6281 Muscle weakness (generalized): Secondary | ICD-10-CM | POA: Diagnosis not present

## 2023-08-24 DIAGNOSIS — Z0100 Encounter for examination of eyes and vision without abnormal findings: Secondary | ICD-10-CM | POA: Diagnosis not present

## 2023-08-26 DIAGNOSIS — M6281 Muscle weakness (generalized): Secondary | ICD-10-CM | POA: Diagnosis not present

## 2023-08-26 DIAGNOSIS — R2689 Other abnormalities of gait and mobility: Secondary | ICD-10-CM | POA: Diagnosis not present

## 2023-08-31 DIAGNOSIS — R2689 Other abnormalities of gait and mobility: Secondary | ICD-10-CM | POA: Diagnosis not present

## 2023-08-31 DIAGNOSIS — M6281 Muscle weakness (generalized): Secondary | ICD-10-CM | POA: Diagnosis not present

## 2023-09-02 DIAGNOSIS — R2689 Other abnormalities of gait and mobility: Secondary | ICD-10-CM | POA: Diagnosis not present

## 2023-09-02 DIAGNOSIS — M6281 Muscle weakness (generalized): Secondary | ICD-10-CM | POA: Diagnosis not present

## 2023-09-06 ENCOUNTER — Ambulatory Visit (INDEPENDENT_AMBULATORY_CARE_PROVIDER_SITE_OTHER): Payer: Medicare HMO

## 2023-09-06 DIAGNOSIS — I5032 Chronic diastolic (congestive) heart failure: Secondary | ICD-10-CM

## 2023-09-06 LAB — ECHOCARDIOGRAM COMPLETE
Area-P 1/2: 4.68 cm2
Calc EF: 41.4 %
MV VTI: 1.29 cm2
P 1/2 time: 480 ms
S' Lateral: 3.17 cm
Single Plane A2C EF: 6.4 %
Single Plane A4C EF: 58.5 %

## 2023-09-07 ENCOUNTER — Telehealth (HOSPITAL_BASED_OUTPATIENT_CLINIC_OR_DEPARTMENT_OTHER): Payer: Self-pay

## 2023-09-07 DIAGNOSIS — R2689 Other abnormalities of gait and mobility: Secondary | ICD-10-CM | POA: Diagnosis not present

## 2023-09-07 DIAGNOSIS — I5032 Chronic diastolic (congestive) heart failure: Secondary | ICD-10-CM

## 2023-09-07 DIAGNOSIS — M6281 Muscle weakness (generalized): Secondary | ICD-10-CM | POA: Diagnosis not present

## 2023-09-07 MED ORDER — SPIRONOLACTONE 25 MG PO TABS: 12.5000 mg | ORAL_TABLET | Freq: Every day | ORAL | 3 refills | Status: DC

## 2023-09-07 NOTE — Telephone Encounter (Addendum)
Results called to patient & wife who verbalizes understanding! Rx updated, labs ordered, they will come in one week to have drawn.    ----- Message from Jacolyn Reedy sent at 09/07/2023 12:13 PM EDT ----- Kidney function up with Crt 1.55. decrease spironolactone 25 mg 1/2 tablet daily. Repeat bmet in 1 week under Caitlyns name. Echo showed normal heart function so shouldn't have fluid overload. Reduce sodium in diet. thanks

## 2023-09-09 DIAGNOSIS — M6281 Muscle weakness (generalized): Secondary | ICD-10-CM | POA: Diagnosis not present

## 2023-09-09 DIAGNOSIS — R2689 Other abnormalities of gait and mobility: Secondary | ICD-10-CM | POA: Diagnosis not present

## 2023-09-14 ENCOUNTER — Other Ambulatory Visit: Payer: Self-pay | Admitting: Neurology

## 2023-09-14 ENCOUNTER — Other Ambulatory Visit: Payer: Self-pay

## 2023-09-14 ENCOUNTER — Ambulatory Visit: Payer: Medicare HMO | Admitting: Neurology

## 2023-09-14 ENCOUNTER — Encounter: Payer: Self-pay | Admitting: Neurology

## 2023-09-14 VITALS — BP 129/70 | HR 90 | Ht 73.0 in | Wt 192.0 lb

## 2023-09-14 DIAGNOSIS — R4189 Other symptoms and signs involving cognitive functions and awareness: Secondary | ICD-10-CM

## 2023-09-14 DIAGNOSIS — Z9889 Other specified postprocedural states: Secondary | ICD-10-CM

## 2023-09-14 DIAGNOSIS — J9601 Acute respiratory failure with hypoxia: Secondary | ICD-10-CM | POA: Diagnosis not present

## 2023-09-14 DIAGNOSIS — G620 Drug-induced polyneuropathy: Secondary | ICD-10-CM | POA: Diagnosis not present

## 2023-09-14 DIAGNOSIS — I480 Paroxysmal atrial fibrillation: Secondary | ICD-10-CM | POA: Diagnosis not present

## 2023-09-14 DIAGNOSIS — F09 Unspecified mental disorder due to known physiological condition: Secondary | ICD-10-CM

## 2023-09-14 DIAGNOSIS — G251 Drug-induced tremor: Secondary | ICD-10-CM

## 2023-09-14 DIAGNOSIS — R2689 Other abnormalities of gait and mobility: Secondary | ICD-10-CM | POA: Diagnosis not present

## 2023-09-14 DIAGNOSIS — E43 Unspecified severe protein-calorie malnutrition: Secondary | ICD-10-CM

## 2023-09-14 DIAGNOSIS — I5033 Acute on chronic diastolic (congestive) heart failure: Secondary | ICD-10-CM

## 2023-09-14 DIAGNOSIS — M6281 Muscle weakness (generalized): Secondary | ICD-10-CM | POA: Diagnosis not present

## 2023-09-14 MED ORDER — DONEPEZIL HCL 5 MG PO TABS: 5.0000 mg | ORAL_TABLET | Freq: Every day | ORAL | 5 refills | Status: DC

## 2023-09-14 NOTE — Patient Instructions (Addendum)
Started ARicept, ordered  EEG and MRI brain .  82 y.o. year old male  here with:     1) acute encephalopathy onset after gallbladder surgery- sepsis.  Here in a state of partial recovery by MMSE and by physical exam. Still having trouble to remember short term , recall, and unable to answer questions about a TV show she just watched.    2) not as excessively sleepy now, taking daily  afternoon nap of one hour duration.    3) there has been a higher creatinine load over the last month. Needs hydration.    I suggested today to  order ATN testing to see if an underlying dementia process has been uncovered , decompensation with his physical illness. The use of namenda or aricept would be discussed too.  I added EEG and MRI order ( non-contrast ). Aricept to start at 5 mg - concern about diastolic CHF, a fib in surgery, that's why he is on amiodarone.        Follow up through our NP within 6 months.

## 2023-09-14 NOTE — Progress Notes (Addendum)
Provider:  Melvyn Novas, MD  Primary Care Physician:  Emilio Aspen, MD [address] [place] [address]     Referring Provider: Emilio Aspen, Md [address] [place],  [place] [address]          Chief Complaint according to patient here with wife marlene and brother steve Pt is here for follow up on cognitive dysfunction. Pt states he has been doing well. Wife states he is able to continue with conversations. Pt's wife states he is talking to himself now while with watching tv. Pt's wife states he remembers what he wants to watch on tv.   Patient presents with:                HISTORY OF PRESENT ILLNESS:  Douglas Hurst. is a 82 y.o. male patient who is here for revisit 09/14/2023 for  memory check up.   Chief concern according to patient :  He is not very conversational, but has gained some weight and has made progress in ambulation with PT.  He  is not in pain. His MMSE today was 20/ 30 points.        Douglas Hurst. is a 82 y.o. male and seen here in the presence of spouse and niece -upon referral from Dr. Orson Aloe for a Consultation/ Evaluation of Dementia. Douglas Hurst, [place]. was seen here today in a first consultation dedicated to evaluate him for possible dementia.  Today is 14 April 2023 and a large part of our conversation today was to establish the history of present illness.  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 in April 2023 this all changed and he became severely ill.   The follow-up care included physical therapy and some reduction in medication.    I was very happy to hear that this gentleman 14 days ago was still bedridden and now can walk with a walker.  I do wonder if the discontinuation of amiodarone would have  made this quick recovery possible but it is not impossible that amiodarone is the main culprit for some of his progressed physical ailments.  He also is no longer on metoprolol and on statins and he can now eat regular food and keep it down. He remains on Eloquis.  Douglas Hurst is a left handed individual with worsening cognitive decline over 12-18 months , and with physical decline as well.  The more recent health history for Douglas Hurst is important in the context of his cognitive and physical developments.  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 he was never that great with numbers it seems but he was very much in tune with his daily appointments date fully oriented until April 2023.  In April 2023 he had a fainting spell at home he collapsed, EMS had to bring him to the local hospital , and gallstones were diagnosed. He was discharged after unsuccessfully attempting the removal.  He went to [facility] to rehabilitation for 14 days, but there were mix ups with his medication.    He still had gallbladder obstruction, the gallstones were not successfully removed and [facility].  He was transferred to use and see [facility].  There the stones were removed but not the gallbladder -He also developed sepsis and pancreatitis as the pancreas was injured , and with this peritonitis he developed atrial fibrillation.  He was transferred back to [facility] and here the cholecystectomy was performed , the  peritonitis was discovered.   He was very ill and had to be fed through a feeding tube.   This time  transfer [facility].    He lost a significant amount of weight, he became more tremulous and his cognition has declined rapidly as well as his physical stamina.  Today's Montreal cognitive assessment test could not be completed.  We also obtained a Mini-Mental  status examination and he scored 12 out of 30 points which is very low.  I am not quite sure that this is his true baseline most recently some changes had been made to medication and he also received a new diagnosis of anemia.  This diagnoses apparently has not been shared with his wife for him.  His daily living activities are fairly impaired he does travel by taxi or automobile with assistance of others, he does not do laundry he can dial a few well-known numbers but does usually not operate a smart phone.  He needs to be accompanied on any shopping trips.  Meals have to be prepared and served, he does not participate in any housekeeping tasks, and he is incapable of dispensing his own medication or handling his own financial affairs. His wife stated she has always handled these things Review of Systems: Out of a complete 14 system review, the patient complains of only the following symptoms, and all other reviewed systems are negative.:     09/14/2023    9:53 AM  MMSE - Mini Mental State Exam  Orientation to time 1  Orientation to Place 4  Registration 3  Attention/ Calculation 3  Recall 1  Language- name 2 objects 2  Language- repeat 1  Language- follow 3 step command 3  Language- read & follow direction 1  Write a sentence 1  Copy design 0  Total score 20     Social History   Socioeconomic History   Marital status: Married    Spouse name: Not on file   Number of children: Not on file   Years of education: Not on file   Highest education level: Not on file  Occupational History   Not on file  Tobacco Use   Smoking status: Former    Types: Cigarettes   Smokeless tobacco: Never  Vaping Use   Vaping status: Never Used  Substance and Sexual Activity   Alcohol use: No   Drug use: No   Sexual activity: Not Currently  Other Topics Concern   Not on file  Social History Narrative   Not on file   Social Determinants of Health   Financial Resource Strain: Not on file  Food  Insecurity: Not on file  Transportation Needs: Not on file  Physical Activity: Not on file  Stress: Not on file  Social Connections: Not on file    Family History  Problem Relation Age of Onset   Heart attack Mother    Cancer - Colon Mother    CAD Mother    Cancer Mother    Dementia Mother    CAD Father    Cancer Father    CVA Father    Dementia  Father    Heart attack Brother    CAD Brother     Past Medical History:  Diagnosis Date   Bipolar disorder (HCC)    Chronic back pain    Complication of anesthesia    prolonged sedation and confusion   COPD (chronic obstructive pulmonary disease) (HCC)    Dr. Valentina Lucks   Diabetes mellitus without complication (HCC)    type 2   Diverticulosis    Dysrhythmia    Afib  onset 03-2022   ED (erectile dysfunction)    Gallstones    GERD (gastroesophageal reflux disease)    Hiatal hernia    Hyperlipidemia    Hypertension    Weakness 03/29/2023   Wears dentures    upper   Wears glasses    Wears hearing aid in both ears     Past Surgical History:  Procedure Laterality Date   BALLOON DILATION N/A 10/01/2015   Procedure: BALLOON DILATION;  Surgeon: Charolett Bumpers, MD;  Location: [facility];  Service: Endoscopy;  Laterality: N/A;   CATARACT EXTRACTION, BILATERAL     CHOLECYSTECTOMY N/A 07/08/2022   Procedure: LAPAROSCOPIC CHOLECYSTECTOMY WITH INTRAOPERATIVE CHOLANGIOGRAM AND LAPAROSCOPIC COMMON DUCT EXPLORATION;  Surgeon: Quentin Ore, MD;  Location: [facility];  Service: General;  Laterality: N/A;   COLONOSCOPY     removed polyps   COLONOSCOPY WITH PROPOFOL N/A 09/01/2016   Procedure: COLONOSCOPY WITH PROPOFOL;  Surgeon: Charolett Bumpers, MD;  Location: [facility];  Service: Endoscopy;  Laterality: N/A;   ERCP N/A 07/09/2022   Procedure: ENDOSCOPIC RETROGRADE CHOLANGIOPANCREATOGRAPHY (ERCP);  Surgeon: Vida Rigger, MD;  Location: [facility];  Service: Gastroenterology;  Laterality: N/A;   ESOPHAGEAL DILATION  06/23/2022    Procedure: PYLORIC  DILATION;  Surgeon: Vida Rigger, MD;  Location: [facility];  Service: Gastroenterology;;   ESOPHAGOGASTRODUODENOSCOPY N/A 06/23/2022   Procedure: ESOPHAGOGASTRODUODENOSCOPY (EGD);  Surgeon: Vida Rigger, MD;  Location: [facility];  Service: Gastroenterology;  Laterality: N/A;   ESOPHAGOGASTRODUODENOSCOPY (EGD) WITH PROPOFOL N/A 10/01/2015   Procedure: ESOPHAGOGASTRODUODENOSCOPY (EGD) WITH PROPOFOL;  Surgeon: Charolett Bumpers, MD;  Location: [facility];  Service: Endoscopy;  Laterality: N/A;   FISTULOTOMY  07/09/2022   Procedure: FISTULOTOMY;  Surgeon: Vida Rigger, MD;  Location: [facility];  Service: Gastroenterology;;   HEMOSTASIS CLIP PLACEMENT  06/23/2022   Procedure: HEMOSTASIS CLIP PLACEMENT;  Surgeon: Vida Rigger, MD;  Location: [facility];  Service: Gastroenterology;;   IR CATHETER TUBE CHANGE  08/20/2022   IR EXCHANGE BILIARY DRAIN  05/15/2022   IR EXCHANGE BILIARY DRAIN  05/26/2022   IR EXCHANGE BILIARY DRAIN  06/11/2022   IR EXCHANGE BILIARY DRAIN  06/23/2022   IR PERC CHOLECYSTOSTOMY  04/26/2022   IR RADIOLOGIST EVAL & MGMT  09/16/2022   KNEE ARTHROSCOPY     TONSILLECTOMY       Current Outpatient Medications on File Prior to Visit  Medication Sig Dispense Refill   acetaminophen (TYLENOL) 325 MG tablet Take 2 tablets (650 mg total) by mouth every 6 (six) hours as needed for mild pain (or Fever >/= 101). 20 tablet 0   ELIQUIS 5 MG TABS tablet TAKE 1 TABLET BY MOUTH TWICE A DAY 180 tablet 3   finasteride (PROSCAR) 5 MG tablet Take 5 mg by mouth daily.     furosemide (LASIX) 20 MG tablet Take 1 tablet (20 mg total) by mouth daily. 90 tablet 3   Multiple Vitamin (MULTIVITAMIN WITH MINERALS) TABS tablet Take 1 tablet by mouth daily.     pantoprazole (PROTONIX) 40  MG tablet Take 40 mg by mouth every morning.     polyethylene glycol powder (GLYCOLAX/MIRALAX) 17 GM/SCOOP powder Take 17 g by mouth daily. (Patient taking differently: Take 17 g by mouth daily as needed  for mild constipation or moderate constipation.) 238 g 0   pravastatin (PRAVACHOL) 40 MG tablet TAKE 1 TABLET BY MOUTH EVERY DAY IN THE EVENING 90 tablet 2   SPIRIVA RESPIMAT 2.5 MCG/ACT AERS Inhale 2 puffs into the lungs daily as needed (COPD).     spironolactone (ALDACTONE) 25 MG tablet Take 0.5 tablets (12.5 mg total) by mouth daily. 45 tablet 3   tadalafil (CIALIS) 20 MG tablet Take 20 mg by mouth every three (3) days as needed for erectile dysfunction.     tamsulosin (FLOMAX) 0.4 MG CAPS capsule Take 0.4 mg by mouth daily after supper.     No current facility-administered medications on file prior to visit.    No Known Allergies   DIAGNOSTIC DATA (LABS, IMAGING, TESTING) - I reviewed patient records, labs, notes, testing and imaging myself where available.  Lab Results  Component Value Date   WBC 7.0 09/06/2022   HGB 11.1 (L) 09/06/2022   HCT 33.8 (L) 09/06/2022   MCV 90.4 09/06/2022   PLT 251 09/06/2022      Component Value Date/Time   NA 137 09/06/2023 0951   K 4.7 09/06/2023 0951   CL 99 09/06/2023 0951   CO2 19 (L) 09/06/2023 0951   GLUCOSE 166 (H) 09/06/2023 0951   GLUCOSE 97 09/06/2022 0232   BUN 25 09/06/2023 0951   CREATININE 1.55 (H) 09/06/2023 0951   CALCIUM 10.0 09/06/2023 0951   PROT 6.3 (L) 08/27/2022 0433   PROT 5.8 (L) 06/15/2022 1006   ALBUMIN 2.1 (L) 08/27/2022 0433   ALBUMIN 3.7 06/15/2022 1006   AST 19 08/27/2022 0433   ALT 29 08/27/2022 0433   ALKPHOS 72 08/27/2022 0433   BILITOT 0.5 08/27/2022 0433   BILITOT 0.5 06/15/2022 1006   GFRNONAA >60 09/06/2022 0232   GFRAA >60 10/02/2015 1956   No results found for: "CHOL", "HDL", "LDLCALC", "LDLDIRECT", "TRIG", "CHOLHDL" Lab Results  Component Value Date   HGBA1C 5.6 07/03/2022   No results found for: "VITAMINB12" Lab Results  Component Value Date   TSH 0.849 06/15/2022    PHYSICAL EXAM:  Today's Vitals   09/14/23 0943  BP: 129/70  Pulse: 90  Weight: 192 lb (87.1 kg)  Height: 6\' 1"   (1.854 m)   Body mass index is 25.33 kg/m.   Wt Readings from Last 3 Encounters:  09/14/23 192 lb (87.1 kg)  07/30/23 186 lb (84.4 kg)  06/18/23 179 lb 14.4 oz (81.6 kg)     Ht Readings from Last 3 Encounters:  09/14/23 6\' 1"  (1.854 m)  07/30/23 6\' 1"  (1.854 m)  06/18/23 6\' 1"  (1.854 m)      General: The patient is awake, alert and appears not in acute distress.  The patient is well groomed. Head: Normocephalic, atraumatic.  Neck is supple. 16 "    Cardiovascular:  Regular rate and palpable peripheral pulse:  Respiratory: clear to auscultation.  Mallampati 2 with a  trembling tongue., Skin:  Without evidence of edema, or rash,  pallor  Trunk: BMI is now back to 25 -  and patient  has an upright normal posture.     Neurologic exam : The patient is awake and alert, oriented to place and time.   Memory subjective described as intact.  There is a  normal attention span & concentration ability.  Speech is fluent with dysphonia and aphasia.  Mood and affect are appropriate.   Cranial nerves: Pupils are equal and briskly reactive to light. Funduscopic exam without  evidence of pallor or edema. Extraocular movements  in vertical and horizontal planes intact and without nystagmus. Visual fields by finger perimetry are intact. Hearing to finger rub intact.   Facial sensation intact to fine touch. Facial motor strength is symmetric and tongue and uvula move midline.   Motor exam:   rigid cog-wheeling , elevated  tone and reduced muscle bulk  but symmetric normal strength in all extremities. Grip Strength symmetric  Proximal strength of shoulder muscles and hip flexors was weaker than expected.  Pronatordrift.  Sensory:  Fine touch and vibration were tested.   Coordination: Rapid alternating movements in the fingers/hands were slowed and tremulous.  Finger-to-nose maneuver was tested and showed  evidence of  tremor. Gait and station: Patient walked with a walker, very stooped-  shuffling .  Core Strength below  limits. Stance is not stable . He rose from the seat by bracing himself, he pulled up to the walker, and when he returned he very carefully sat down , keeping one hand on the walkers handle.   Deep tendon reflexes: in the  upper and lower extremities are symmetric and  brisk without Clonus. Babinski maneuver response is downgoing.      ASSESSMENT AND PLAN 82 y.o. year old male  here with:    1) acute encephalopathy onset after gallbladder surgery- sepsis.  Here in a state of partial recovery by MMSE and by physical exam. Still having trouble to remember short term , recall, and unable to answer questions about a TV show she just watched.   2) not as excessively sleepy now, taking daily  afternoon nap of one hour duration.   3) there has been a higher creatinine load over the last month. Needs hydration.   I suggested today to  order ATN testing to see if an underlying dementia process has been uncovered , decompensation with his physical illness. The use of namenda or aricept would be discussed too.  I added EEG and MRI order ( non-contrast ). Aricept continue  10mg  - concern about diastolic CHF, a fib in surgery, that's why he is on amiodarone.      Follow up through our NP within 6 months.   I would like to thank Emilio Aspen, MD  for allowing me to meet with and to take care of this pleasant patient.    After spending a total time of  35  minutes face to face and additional time for physical and neurologic examination, review of laboratory studies,  personal review of imaging studies, reports and results of other testing and review of referral information / records as far as provided in visit,   Electronically signed by: Melvyn Novas, MD 09/14/2023 10:15 AM  [facility] Board certified by The [employer] of Sleep Medicine and Diplomate of the [employer] of Sleep Medicine. Board certified  In Neurology through the ABPN, Fellow of the [employer] of Neu[employer].

## 2023-09-16 DIAGNOSIS — R2689 Other abnormalities of gait and mobility: Secondary | ICD-10-CM | POA: Diagnosis not present

## 2023-09-16 DIAGNOSIS — M6281 Muscle weakness (generalized): Secondary | ICD-10-CM | POA: Diagnosis not present

## 2023-09-17 ENCOUNTER — Telehealth: Payer: Self-pay | Admitting: Neurology

## 2023-09-17 LAB — ATN PROFILE
A -- Beta-amyloid 42/40 Ratio: 0.097 — ABNORMAL LOW (ref >0.102)
Beta-amyloid 40: 269.45 pg/mL
Beta-amyloid 42: 26.11 pg/mL
N -- NfL, Plasma: 4.19 pg/mL (ref 0.00–11.55)
T -- p-tau181: 0.99 pg/mL — ABNORMAL HIGH (ref 0.00–0.97)

## 2023-09-17 NOTE — Telephone Encounter (Signed)
[employer] #[id number] need to upload notes.

## 2023-09-20 NOTE — Telephone Encounter (Signed)
Ethlyn Gallery: [phone] exp. 09/17/23-11/16/23 for GI

## 2023-09-21 DIAGNOSIS — R2689 Other abnormalities of gait and mobility: Secondary | ICD-10-CM | POA: Diagnosis not present

## 2023-09-21 DIAGNOSIS — M6281 Muscle weakness (generalized): Secondary | ICD-10-CM | POA: Diagnosis not present

## 2023-09-22 ENCOUNTER — Telehealth: Payer: Self-pay | Admitting: Neurology

## 2023-09-22 DIAGNOSIS — I5032 Chronic diastolic (congestive) heart failure: Secondary | ICD-10-CM | POA: Diagnosis not present

## 2023-09-22 LAB — BASIC METABOLIC PANEL
BUN/Creatinine Ratio: 15 (ref 10–24)
BUN: 19 mg/dL (ref 8–27)
CO2: 21 mmol/L (ref 20–29)
Calcium: 9.4 mg/dL (ref 8.6–10.2)
Chloride: 104 mmol/L (ref 96–106)
Creatinine, Ser: 1.26 mg/dL (ref 0.76–1.27)
Glucose: 163 mg/dL — ABNORMAL HIGH (ref 70–99)
Potassium: 4.6 mmol/L (ref 3.5–5.2)
Sodium: 138 mmol/L (ref 134–144)
eGFR: 58 mL/min/{1.73_m2} — ABNORMAL LOW (ref >59)

## 2023-09-22 NOTE — Telephone Encounter (Signed)
-----   Message from [place] Dohmeier sent at 09/20/2023  5:55 PM EDT ----- AD Biomarker positive for Alzheimer's disease, high P tau and decreased ratio A 42/ 40.

## 2023-09-22 NOTE — Telephone Encounter (Signed)
Called the patient and spoke with the wife on [place]. I was able to review the lab results with her. She asked I fax these results to her at [phone]. I will send that over. She was appreciative for the information and wanted to mention they are seeing positive results with the 10 mg aricept.

## 2023-09-23 DIAGNOSIS — R2689 Other abnormalities of gait and mobility: Secondary | ICD-10-CM | POA: Diagnosis not present

## 2023-09-23 DIAGNOSIS — M6281 Muscle weakness (generalized): Secondary | ICD-10-CM | POA: Diagnosis not present

## 2023-09-24 ENCOUNTER — Ambulatory Visit
Admission: RE | Admit: 2023-09-24 | Discharge: 2023-09-24 | Disposition: A | Discharge status: Home / Self Care | Payer: Medicare HMO | Source: Ambulatory Visit | Attending: Neurology | Admitting: Neurology

## 2023-09-24 DIAGNOSIS — R4189 Other symptoms and signs involving cognitive functions and awareness: Secondary | ICD-10-CM | POA: Diagnosis not present

## 2023-09-24 DIAGNOSIS — G620 Drug-induced polyneuropathy: Secondary | ICD-10-CM

## 2023-09-24 DIAGNOSIS — F09 Unspecified mental disorder due to known physiological condition: Secondary | ICD-10-CM

## 2023-09-24 DIAGNOSIS — Z9889 Other specified postprocedural states: Secondary | ICD-10-CM | POA: Diagnosis not present

## 2023-09-24 DIAGNOSIS — T462X5A Adverse effect of other antidysrhythmic drugs, initial encounter: Secondary | ICD-10-CM | POA: Diagnosis not present

## 2023-09-27 ENCOUNTER — Telehealth: Payer: Self-pay

## 2023-09-27 NOTE — Telephone Encounter (Signed)
Called [facility]inet and spoke with his wife and informed her per Dr. Vickey Huger. Pt wife verbalized understanding. Pt had no questions at this time but was encouraged to call back if questions arise.

## 2023-09-27 NOTE — Telephone Encounter (Signed)
-----   Message from [place] Dohmeier sent at 09/25/2023  2:49 PM EDT ----- Generalized brain atrophy without lobar dominance, this would be considered age related, there is also white matter disease- this can increase with age. No acute changes.

## 2023-09-28 DIAGNOSIS — M6281 Muscle weakness (generalized): Secondary | ICD-10-CM | POA: Diagnosis not present

## 2023-09-28 DIAGNOSIS — R2689 Other abnormalities of gait and mobility: Secondary | ICD-10-CM | POA: Diagnosis not present

## 2023-09-29 ENCOUNTER — Other Ambulatory Visit: Payer: Self-pay | Admitting: *Deleted

## 2023-09-29 DIAGNOSIS — I7143 Infrarenal abdominal aortic aneurysm, without rupture: Secondary | ICD-10-CM

## 2023-09-30 DIAGNOSIS — R2689 Other abnormalities of gait and mobility: Secondary | ICD-10-CM | POA: Diagnosis not present

## 2023-09-30 DIAGNOSIS — M6281 Muscle weakness (generalized): Secondary | ICD-10-CM | POA: Diagnosis not present

## 2023-10-03 ENCOUNTER — Other Ambulatory Visit (HOSPITAL_BASED_OUTPATIENT_CLINIC_OR_DEPARTMENT_OTHER): Payer: Self-pay | Admitting: Cardiology

## 2023-10-05 DIAGNOSIS — M6281 Muscle weakness (generalized): Secondary | ICD-10-CM | POA: Diagnosis not present

## 2023-10-05 DIAGNOSIS — R2689 Other abnormalities of gait and mobility: Secondary | ICD-10-CM | POA: Diagnosis not present

## 2023-10-06 DIAGNOSIS — D485 Neoplasm of uncertain behavior of skin: Secondary | ICD-10-CM | POA: Diagnosis not present

## 2023-10-06 DIAGNOSIS — L570 Actinic keratosis: Secondary | ICD-10-CM | POA: Diagnosis not present

## 2023-10-07 DIAGNOSIS — R2689 Other abnormalities of gait and mobility: Secondary | ICD-10-CM | POA: Diagnosis not present

## 2023-10-07 DIAGNOSIS — M6281 Muscle weakness (generalized): Secondary | ICD-10-CM | POA: Diagnosis not present

## 2023-10-08 DIAGNOSIS — R351 Nocturia: Secondary | ICD-10-CM | POA: Diagnosis not present

## 2023-10-08 DIAGNOSIS — R3915 Urgency of urination: Secondary | ICD-10-CM | POA: Diagnosis not present

## 2023-10-08 DIAGNOSIS — N401 Enlarged prostate with lower urinary tract symptoms: Secondary | ICD-10-CM | POA: Diagnosis not present

## 2023-10-11 DIAGNOSIS — M6281 Muscle weakness (generalized): Secondary | ICD-10-CM | POA: Diagnosis not present

## 2023-10-11 DIAGNOSIS — R2689 Other abnormalities of gait and mobility: Secondary | ICD-10-CM | POA: Diagnosis not present

## 2023-10-12 ENCOUNTER — Ambulatory Visit: Payer: Medicare HMO | Admitting: Neurology

## 2023-10-12 DIAGNOSIS — G251 Drug-induced tremor: Secondary | ICD-10-CM

## 2023-10-12 DIAGNOSIS — R4182 Altered mental status, unspecified: Secondary | ICD-10-CM | POA: Diagnosis not present

## 2023-10-12 DIAGNOSIS — G620 Drug-induced polyneuropathy: Secondary | ICD-10-CM

## 2023-10-12 DIAGNOSIS — Z9889 Other specified postprocedural states: Secondary | ICD-10-CM

## 2023-10-12 DIAGNOSIS — F09 Unspecified mental disorder due to known physiological condition: Secondary | ICD-10-CM

## 2023-10-12 DIAGNOSIS — R4189 Other symptoms and signs involving cognitive functions and awareness: Secondary | ICD-10-CM

## 2023-10-13 ENCOUNTER — Ambulatory Visit (HOSPITAL_COMMUNITY)
Admission: RE | Admit: 2023-10-13 | Discharge: 2023-10-13 | Disposition: A | Discharge status: Home / Self Care | Payer: Medicare HMO | Source: Ambulatory Visit | Attending: Vascular Surgery | Admitting: Vascular Surgery

## 2023-10-13 ENCOUNTER — Ambulatory Visit: Payer: Medicare HMO | Admitting: Physician Assistant

## 2023-10-13 ENCOUNTER — Encounter: Payer: Self-pay | Admitting: Physician Assistant

## 2023-10-13 VITALS — BP 137/88 | HR 84 | Temp 97.2°F | Resp 18 | Ht 73.0 in | Wt 192.0 lb

## 2023-10-13 DIAGNOSIS — I7143 Infrarenal abdominal aortic aneurysm, without rupture: Secondary | ICD-10-CM | POA: Insufficient documentation

## 2023-10-13 NOTE — Progress Notes (Addendum)
HISTORY AND PHYSICAL     CC:  follow up. Requesting Provider:  Emilio Aspen, *  HPI: This is a 82 y.o. male who is here today for follow up for AAA.  He was seen by Dr. 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  He was living in a SNF.  He did walk some with some limitation.  Pt was scheduled for one year f/u with duplex.   The pt returns today for follow up with his wife of 37 years.  He states that he is back home from SNF for quite some time now.  He is doing well overall.  He has trouble with some walking due to pain in his legs.  He does not have any claudication or rest pain or non healing wounds.  He does not have any new abdominal or back pain.  He does have some chronic back pain.  He is on Eliquis for afib he developed at the time he was in the hospital.  He has not had afib for a while according to his wife.  He remains on Eliquis.   He does get some swelling in his legs as well.   The pt is on a statin for cholesterol management.    The pt is not on an aspirin.    Other AC:  Eliquis The pt is on diuretic for hypertension.  The pt is not on medication diabetes. Tobacco hx:  former  Pt does not have family hx of AAA.  Past Medical History:  Diagnosis Date   Bipolar disorder (HCC)    Chronic back pain    Complication of anesthesia    prolonged sedation and confusion   COPD (chronic obstructive pulmonary disease) (HCC)    Dr. Valentina Lucks   Diabetes mellitus without complication [facility])    type 2   Diverticulosis    Dysrhythmia    Afib  onset 03-2022   ED (erectile dysfunction)    Gallstones    GERD (gastroesophageal reflux disease)    Hiatal hernia    Hyperlipidemia    Hypertension    Weakness 03/29/2023   Wears dentures    upper   Wears glasses    Wears hearing aid in both ears     Past Surgical History:  Procedure Laterality Date   BALLOON DILATION N/A 10/01/2015   Procedure: BALLOON DILATION;   Surgeon: Charolett Bumpers, MD;  Location: [facility];  Service: Endoscopy;  Laterality: N/A;   CATARACT EXTRACTION, BILATERAL     CHOLECYSTECTOMY N/A 07/08/2022   Procedure: LAPAROSCOPIC CHOLECYSTECTOMY WITH INTRAOPERATIVE CHOLANGIOGRAM AND LAPAROSCOPIC COMMON DUCT EXPLORATION;  Surgeon: Quentin Ore, MD;  Location: [facility];  Service: General;  Laterality: N/A;   COLONOSCOPY     removed polyps   COLONOSCOPY WITH PROPOFOL N/A 09/01/2016   Procedure: COLONOSCOPY WITH PROPOFOL;  Surgeon: Charolett Bumpers, MD;  Location: [facility];  Service: Endoscopy;  Laterality: N/A;   ERCP N/A 07/09/2022   Procedure: ENDOSCOPIC RETROGRADE CHOLANGIOPANCREATOGRAPHY (ERCP);  Surgeon: Vida Rigger, MD;  Location: [facility];  Service: Gastroenterology;  Laterality: N/A;   ESOPHAGEAL DILATION  06/23/2022   Procedure: PYLORIC  DILATION;  Surgeon: Vida Rigger, MD;  Location: [facility];  Service: Gastroenterology;;   ESOPHAGOGASTRODUODENOSCOPY N/A 06/23/2022   Procedure: ESOPHAGOGASTRODUODENOSCOPY (EGD);  Surgeon: Vida Rigger, MD;  Location: [facility];  Service: Gastroenterology;  Laterality: N/A;   ESOPHAGOGASTRODUODENOSCOPY (EGD) WITH PROPOFOL N/A 10/01/2015   Procedure: ESOPHAGOGASTRODUODENOSCOPY (  EGD) WITH PROPOFOL;  Surgeon: Charolett Bumpers, MD;  Location: [facility];  Service: Endoscopy;  Laterality: N/A;   FISTULOTOMY  07/09/2022   Procedure: FISTULOTOMY;  Surgeon: Vida Rigger, MD;  Location: [facility];  Service: Gastroenterology;;   HEMOSTASIS CLIP PLACEMENT  06/23/2022   Procedure: HEMOSTASIS CLIP PLACEMENT;  Surgeon: Vida Rigger, MD;  Location: [facility];  Service: Gastroenterology;;   IR CATHETER TUBE CHANGE  08/20/2022   IR EXCHANGE BILIARY DRAIN  05/15/2022   IR EXCHANGE BILIARY DRAIN  05/26/2022   IR EXCHANGE BILIARY DRAIN  06/11/2022   IR EXCHANGE BILIARY DRAIN  06/23/2022   IR PERC CHOLECYSTOSTOMY  04/26/2022   IR RADIOLOGIST EVAL & MGMT  09/16/2022   KNEE ARTHROSCOPY     TONSILLECTOMY       No Known Allergies  Current Outpatient Medications  Medication Sig Dispense Refill   acetaminophen (TYLENOL) 325 MG tablet Take 2 tablets (650 mg total) by mouth every 6 (six) hours as needed for mild pain (or Fever >/= 101). 20 tablet 0   donepezil (ARICEPT) 5 MG tablet Take 1 tablet (5 mg total) by mouth at bedtime. 30 tablet 5   ELIQUIS 5 MG TABS tablet TAKE 1 TABLET BY MOUTH TWICE A DAY 180 tablet 3   finasteride (PROSCAR) 5 MG tablet Take 5 mg by mouth daily.     furosemide (LASIX) 20 MG tablet TAKE 1 TABLET AS NEEDED FOR WEIGHT GAIN OF 3 POUNDS IN 24 HOURS OR 5 POUNDS IN A WEEK 90 tablet 3   Multiple Vitamin (MULTIVITAMIN WITH MINERALS) TABS tablet Take 1 tablet by mouth daily.     pantoprazole (PROTONIX) 40 MG tablet Take 40 mg by mouth every morning.     polyethylene glycol powder (GLYCOLAX/MIRALAX) 17 GM/SCOOP powder Take 17 g by mouth daily. (Patient taking differently: Take 17 g by mouth daily as needed for mild constipation or moderate constipation.) 238 g 0   pravastatin (PRAVACHOL) 40 MG tablet TAKE 1 TABLET BY MOUTH EVERY DAY IN THE EVENING 90 tablet 2   SPIRIVA RESPIMAT 2.5 MCG/ACT AERS Inhale 2 puffs into the lungs daily as needed (COPD).     spironolactone (ALDACTONE) 25 MG tablet Take 0.5 tablets (12.5 mg total) by mouth daily. 45 tablet 3   tadalafil (CIALIS) 20 MG tablet Take 20 mg by mouth every three (3) days as needed for erectile dysfunction.     tamsulosin (FLOMAX) 0.4 MG CAPS capsule Take 0.4 mg by mouth daily after supper.     No current facility-administered medications for this visit.    Family History  Problem Relation Age of Onset   Heart attack Mother    Cancer - Colon Mother    CAD Mother    Cancer Mother    Dementia Mother    CAD Father    Cancer Father    CVA Father    Dementia Father    Heart attack Brother    CAD Brother     Social History   Socioeconomic History   Marital status: Married    Spouse name: Not on file   Number of  children: Not on file   Years of education: Not on file   Highest education level: Not on file  Occupational History   Not on file  Tobacco Use   Smoking status: Former    Types: Cigarettes   Smokeless tobacco: Never  Vaping Use   Vaping status: Never Used  Substance and Sexual Activity   Alcohol use: No  Drug use: No   Sexual activity: Not Currently  Other Topics Concern   Not on file  Social History Narrative   Not on file   Social Determinants of Health   Financial Resource Strain: Not on file  Food Insecurity: Not on file  Transportation Needs: Not on file  Physical Activity: Not on file  Stress: Not on file  Social Connections: Not on file  Intimate Partner Violence: Not on file     REVIEW OF SYSTEMS:   [X]  denotes positive finding, [ ]  denotes negative finding Cardiac  Comments:  Chest pain or chest pressure:    Shortness of breath upon exertion:    Short of breath when lying flat:    Irregular heart rhythm:        Vascular    Pain in calf, thigh, or hip brought on by ambulation:    Pain in feet at night that wakes you up from your sleep:     Blood clot in your veins:    Leg swelling:         Pulmonary    Oxygen at home:    Productive cough:     Wheezing:         Neurologic    Sudden weakness in arms or legs:     Sudden numbness in arms or legs:     Sudden onset of difficulty speaking or slurred speech:    Temporary loss of vision in one eye:     Problems with dizziness:         Gastrointestinal    Blood in stool:     Vomited blood:         Genitourinary    Burning when urinating:     Blood in urine:        Psychiatric    Major depression:         Hematologic    Bleeding problems:    Problems with blood clotting too easily:        Skin    Rashes or ulcers:        Constitutional    Fever or chills:      PHYSICAL EXAMINATION:  Today's Vitals   10/13/23 1006 10/13/23 1007  BP: 137/88   Pulse: 84   Resp: 18   Temp: (!) 97.2 F  (36.2 C)   SpO2: 94%   Weight: 192 lb (87.1 kg)   Height: 6\' 1"  (1.854 m)   PainSc: 0-No pain 0-No pain   Body mass index is 25.33 kg/m.   General:  WDWN in NAD; vital signs documented above Gait: Not observed HENT: WNL, normocephalic Pulmonary: normal non-labored breathing  Cardiac: regular HR, without carotid bruits Abdomen: soft, NT; aortic pulse is not palpable Skin: without rashes Vascular Exam/Pulses:  Right Left  Radial 2+ (normal) 2+ (normal)  AT monophasic monophasic  PT biphasic biphasic   Extremities: without ischemic changes, without Gangrene , without cellulitis; without open wounds Musculoskeletal: no muscle wasting or atrophy  Neurologic: A&O X 3;  No focal weakness or paresthesias are detected Psychiatric:  The pt has Normal affect.   Non-Invasive Vascular Imaging:   AAA Arterial duplex on 10/13/2023: Abdominal Aorta Findings:  +-------------+-------+----------+----------+----------+--------+--------+  Location    AP (cm)Trans (cm)PSV (cm/s)Waveform  ThrombusComments  +-------------+-------+----------+----------+----------+--------+--------+  Proximal    2.27   2.26      71                                    +-------------+-------+----------+----------+----------+--------+--------+  Mid         2.60   2.88      47                                    +-------------+-------+----------+----------+----------+--------+--------+  Distal      4.03   4.50      36                                    +-------------+-------+----------+----------+----------+--------+--------+  RT CIA Prox  1.6    1.6       59        monophasic                  +-------------+-------+----------+----------+----------+--------+--------+  RT CIA Mid   2.2    2.0                                             +-------------+-------+----------+----------+----------+--------+--------+  RT EIA Prox                   99        monophasic                   +-------------+-------+----------+----------+----------+--------+--------+  RT EIA Distal                 140       biphasic                    +-------------+-------+----------+----------+----------+--------+--------+  LT CIA Prox  1.6    1.7       70        monophasic                  +-------------+-------+----------+----------+----------+--------+--------+  LT CIA Distal                 161       biphasic                    +-------------+-------+----------+----------+----------+--------+--------+  LT EIA Mid                    139       biphasic                    +-------------+-------+----------+----------+----------+--------+--------+  LT EIA Distal                 103       biphasic                    +-------------+-------+----------+----------+----------+--------+--------+     ASSESSMENT/PLAN:: 82 y.o. male here for follow up for AAA  -pt's AAA increased to 4.5cm on today's exam.  Will have him return in 6 months for repeat AAA duplex to make sure it is not increasing in size.   -he does have some pain in his legs but has biphasic PT doppler signals.  When he returns, will get baseline ABI's at that time.   -discussed with he and his wife should he develop sudden severe back or abdominal pain, he should call 911 as a ruptured AAA is life threatening.   -pt will f/u in  6 months with AAA duplex and ABI. -continue statin   Doreatha Massed, [facility] [phone]  Clinic MD:   Randie Heinz

## 2023-10-13 NOTE — Procedures (Signed)
[facility]  EEG (ELECTROENCEPHALOGRAM) REPORT    Douglas Hurst. Male, 82 y.o., 1941/05/02 MRN: [id number] ORDERING CLINICIAN: Melvyn Novas, M.D.  TECHNOLOGIST: Marianne Sofia, REEGT TECHNIQUE:  This Electroencephalogram was recorded utilizing the international standard 10-20 system of lead placement and reformatted into average and bipolar montages.  A single ECG electrode is placed to detect heart rate and rhythm. While a camera is directed at the patient during EEG , and a video screen is parallel to the EEG observed by the attending technologist, the video and audio can not be recorded.        Description: The EEG's posterior dominant background rhythm of 7.5  hertz was symmetrically displayed while the patient's eyes were closed , but did not attenuate with eye opening. At baseline, the recording showed a low  amplitude with high motion artefact, beta fast artefact. The patient was unable to relax.   Photic stimulation was initiated at frequencies from 3- through 21 hertz, resulting in no photic entrainment at any of these frequencies.  There was constant motion without notable periodic or rhythmic discharges, or epileptiform activity.  The patient was recorded while awake, while drowsy ,and  not  while asleep.  EKG was regular between 58 and 64 bpm.   IMPRESSION:  Abnormal slowing.  The validity of the  EEG is severely impaired by motion artefact but showed slowing to a background rhythm of 7.5 hertz ,remains symmetric and without epileptiform discharges.     Dr. Melvyn Novas, M.D. Accredited by the ABPN, ABSM.

## 2023-10-14 ENCOUNTER — Telehealth: Payer: Self-pay | Admitting: Neurology

## 2023-10-14 DIAGNOSIS — M6281 Muscle weakness (generalized): Secondary | ICD-10-CM | POA: Diagnosis not present

## 2023-10-14 DIAGNOSIS — R2689 Other abnormalities of gait and mobility: Secondary | ICD-10-CM | POA: Diagnosis not present

## 2023-10-14 NOTE — Telephone Encounter (Signed)
-----   Message from [place] Dohmeier sent at 10/13/2023  5:19 PM EDT ----- Generalized slowing, encephalopathic.  No epileptiform activity was seen but a significant motion artefact remained present.

## 2023-10-14 NOTE — Telephone Encounter (Signed)
Called the patient and reviewed the EEG results with him. Pt verbalized understanding. Pt had no questions at this time but was encouraged to call back if questions arise.

## 2023-10-22 ENCOUNTER — Other Ambulatory Visit (HOSPITAL_BASED_OUTPATIENT_CLINIC_OR_DEPARTMENT_OTHER): Payer: Self-pay | Admitting: Family

## 2023-10-27 DIAGNOSIS — M6281 Muscle weakness (generalized): Secondary | ICD-10-CM | POA: Diagnosis not present

## 2023-10-27 DIAGNOSIS — R2689 Other abnormalities of gait and mobility: Secondary | ICD-10-CM | POA: Diagnosis not present

## 2023-10-29 DIAGNOSIS — M6281 Muscle weakness (generalized): Secondary | ICD-10-CM | POA: Diagnosis not present

## 2023-10-29 DIAGNOSIS — R2689 Other abnormalities of gait and mobility: Secondary | ICD-10-CM | POA: Diagnosis not present

## 2023-11-02 DIAGNOSIS — M6281 Muscle weakness (generalized): Secondary | ICD-10-CM | POA: Diagnosis not present

## 2023-11-02 DIAGNOSIS — R2689 Other abnormalities of gait and mobility: Secondary | ICD-10-CM | POA: Diagnosis not present

## 2023-11-04 DIAGNOSIS — M6281 Muscle weakness (generalized): Secondary | ICD-10-CM | POA: Diagnosis not present

## 2023-11-04 DIAGNOSIS — R2689 Other abnormalities of gait and mobility: Secondary | ICD-10-CM | POA: Diagnosis not present

## 2023-11-05 ENCOUNTER — Other Ambulatory Visit: Payer: Self-pay

## 2023-11-05 DIAGNOSIS — M79604 Pain in right leg: Secondary | ICD-10-CM

## 2023-11-05 DIAGNOSIS — I7143 Infrarenal abdominal aortic aneurysm, without rupture: Secondary | ICD-10-CM

## 2023-11-09 DIAGNOSIS — M6281 Muscle weakness (generalized): Secondary | ICD-10-CM | POA: Diagnosis not present

## 2023-11-09 DIAGNOSIS — R2689 Other abnormalities of gait and mobility: Secondary | ICD-10-CM | POA: Diagnosis not present

## 2023-11-11 DIAGNOSIS — R2689 Other abnormalities of gait and mobility: Secondary | ICD-10-CM | POA: Diagnosis not present

## 2023-11-11 DIAGNOSIS — M6281 Muscle weakness (generalized): Secondary | ICD-10-CM | POA: Diagnosis not present

## 2023-11-12 ENCOUNTER — Encounter (HOSPITAL_BASED_OUTPATIENT_CLINIC_OR_DEPARTMENT_OTHER): Payer: Self-pay | Admitting: Cardiology

## 2023-11-12 ENCOUNTER — Ambulatory Visit (HOSPITAL_BASED_OUTPATIENT_CLINIC_OR_DEPARTMENT_OTHER): Payer: Medicare HMO | Admitting: Cardiology

## 2023-11-12 VITALS — BP 128/82 | HR 76 | Ht 72.0 in | Wt 201.9 lb

## 2023-11-12 DIAGNOSIS — E782 Mixed hyperlipidemia: Secondary | ICD-10-CM

## 2023-11-12 DIAGNOSIS — D6859 Other primary thrombophilia: Secondary | ICD-10-CM

## 2023-11-12 DIAGNOSIS — I7143 Infrarenal abdominal aortic aneurysm, without rupture: Secondary | ICD-10-CM

## 2023-11-12 DIAGNOSIS — Z7901 Long term (current) use of anticoagulants: Secondary | ICD-10-CM

## 2023-11-12 DIAGNOSIS — I5032 Chronic diastolic (congestive) heart failure: Secondary | ICD-10-CM | POA: Diagnosis not present

## 2023-11-12 DIAGNOSIS — I480 Paroxysmal atrial fibrillation: Secondary | ICD-10-CM | POA: Diagnosis not present

## 2023-11-12 MED ORDER — FUROSEMIDE 20 MG PO TABS
20.0000 mg | ORAL_TABLET | Freq: Every day | ORAL | 3 refills | Status: DC
Start: 2023-11-12 — End: 2024-09-13

## 2023-11-12 NOTE — Patient Instructions (Signed)

## 2023-11-12 NOTE — Progress Notes (Signed)
  Cardiology Office Note:  .   Date:  11/12/2023  ID:  Douglas Iha., DOB Jan 19, 1941, MRN [id number] PCP: Emilio Aspen, MD  [facility] Providers Cardiologist:  Jodelle Red, MD {  History of Present Illness: Douglas Iha. is a 82 y.o. male with PMH atrial fibrillation, hypertension, hyperlipidemia, type II diabetes. I met him 06/18/23.  Pertinent CV history: most recent echo 2024 EF 50-55%, prior 40-45% 03/2022, 60-65% 04/2022. I met him 06/18/23 for urgent visit for 10 lbs weight gain, one sided leg swelling.   Today: Seen by Luther Parody 07/2023, BNP normal, swelling improved. Feels better than last visit. Weight is 201 lb today, which is up from prior, but has not had any new swelling. Prior swelling resolved. Notes that his appetite is better than it was, eating more. Weight in June was 171 lbs, so this is about 30 lb weight gain in 5 mos. Reviewed that echo has preserved LVEF  ROS: Denies chest pain, shortness of breath at rest or with normal exertion. No PND, orthopnea, LE edema or unexpected weight gain. No syncope or palpitations. ROS otherwise negative except as noted.    Studies Reviewed: Marland Kitchen    EKG:     not ordered today  Physical Exam:   VS:  BP 128/82   Pulse 76   Ht 6' (1.829 m)   Wt 201 lb 14.4 oz (91.6 kg)   SpO2 98%   BMI 27.38 kg/m    Wt Readings from Last 3 Encounters:  11/12/23 201 lb 14.4 oz (91.6 kg)  10/13/23 192 lb (87.1 kg)  09/14/23 192 lb (87.1 kg)    GEN: Well nourished, well developed in no acute distress HEENT: Normal, moist mucous membranes NECK: No JVD CARDIAC: regular rhythm, normal S1 and S2, no rubs or gallops. No murmur. VASCULAR: Radial and DP pulses 2+ bilaterally. No carotid bruits RESPIRATORY:  Clear to auscultation without rales, wheezing or rhonchi  ABDOMEN: Soft, non-tender, non-distended MUSCULOSKELETAL:  Ambulates independently SKIN: Warm and dry, no edema NEUROLOGIC:  Alert and oriented x 3. No  focal neuro deficits noted. PSYCHIATRIC:  Normal affect    ASSESSMENT AND PLAN: .   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 Echo with normal EF. BNP normalized -weight continue to go up, but he reports improved appetite and increase in caloric intake. Do not suspect this is due to fluid. Prior weight 171 lbs, 201 lbs today -reviewed signs/symptoms to watch for -taking lasix every day, which has kept fluid off. 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

## 2023-11-18 ENCOUNTER — Telehealth: Payer: Self-pay | Admitting: Cardiology

## 2023-11-18 DIAGNOSIS — R2689 Other abnormalities of gait and mobility: Secondary | ICD-10-CM | POA: Diagnosis not present

## 2023-11-18 DIAGNOSIS — M6281 Muscle weakness (generalized): Secondary | ICD-10-CM | POA: Diagnosis not present

## 2023-11-18 NOTE — Telephone Encounter (Signed)
Spoke with wife regarding patients urine Per wife urine orange and cloudy  Denies any pain when urinating however does have urgency but that is not new While talking with wife she stated she went to pour urine down toilet and there are particles floating around in urine  Recommended contacting PCP or urgent care for evaluation  Wife stated she had call in to urologist  Advised to wait for call back that he needed urinalysis  Wife verbalized understanding

## 2023-11-18 NOTE — Telephone Encounter (Signed)
Pt's would like a c/b regarding pt's urine being "cloudy, milky, orange" looking. She states that it has been like this since yesterday. Please advise

## 2023-11-21 ENCOUNTER — Emergency Department (HOSPITAL_BASED_OUTPATIENT_CLINIC_OR_DEPARTMENT_OTHER)
Admission: EM | Admit: 2023-11-21 | Discharge: 2023-11-21 | Disposition: A | Discharge status: Home / Self Care | Payer: Medicare HMO | Attending: Emergency Medicine | Admitting: Emergency Medicine

## 2023-11-21 ENCOUNTER — Emergency Department (HOSPITAL_COMMUNITY): Payer: Medicare HMO

## 2023-11-21 ENCOUNTER — Encounter (HOSPITAL_BASED_OUTPATIENT_CLINIC_OR_DEPARTMENT_OTHER): Payer: Self-pay

## 2023-11-21 ENCOUNTER — Other Ambulatory Visit: Payer: Self-pay

## 2023-11-21 DIAGNOSIS — E119 Type 2 diabetes mellitus without complications: Secondary | ICD-10-CM | POA: Insufficient documentation

## 2023-11-21 DIAGNOSIS — N5082 Scrotal pain: Secondary | ICD-10-CM | POA: Diagnosis not present

## 2023-11-21 DIAGNOSIS — I110 Hypertensive heart disease with heart failure: Secondary | ICD-10-CM | POA: Diagnosis not present

## 2023-11-21 DIAGNOSIS — K6289 Other specified diseases of anus and rectum: Secondary | ICD-10-CM | POA: Diagnosis not present

## 2023-11-21 DIAGNOSIS — W06XXXA Fall from bed, initial encounter: Secondary | ICD-10-CM | POA: Insufficient documentation

## 2023-11-21 DIAGNOSIS — R319 Hematuria, unspecified: Secondary | ICD-10-CM | POA: Insufficient documentation

## 2023-11-21 DIAGNOSIS — J449 Chronic obstructive pulmonary disease, unspecified: Secondary | ICD-10-CM | POA: Diagnosis not present

## 2023-11-21 DIAGNOSIS — N451 Epididymitis: Secondary | ICD-10-CM | POA: Diagnosis not present

## 2023-11-21 DIAGNOSIS — Z7901 Long term (current) use of anticoagulants: Secondary | ICD-10-CM | POA: Insufficient documentation

## 2023-11-21 DIAGNOSIS — I5032 Chronic diastolic (congestive) heart failure: Secondary | ICD-10-CM | POA: Diagnosis not present

## 2023-11-21 DIAGNOSIS — R310 Gross hematuria: Secondary | ICD-10-CM | POA: Diagnosis not present

## 2023-11-21 DIAGNOSIS — Z043 Encounter for examination and observation following other accident: Secondary | ICD-10-CM | POA: Diagnosis not present

## 2023-11-21 DIAGNOSIS — M160 Bilateral primary osteoarthritis of hip: Secondary | ICD-10-CM | POA: Diagnosis not present

## 2023-11-21 DIAGNOSIS — N5089 Other specified disorders of the male genital organs: Secondary | ICD-10-CM | POA: Diagnosis not present

## 2023-11-21 LAB — CBC WITH DIFFERENTIAL/PLATELET
Abs Immature Granulocytes: 0.06 10*3/uL (ref 0.00–0.07)
Basophils Absolute: 0 10*3/uL (ref 0.0–0.1)
Basophils Relative: 1 %
Eosinophils Absolute: 0.1 10*3/uL (ref 0.0–0.5)
Eosinophils Relative: 1 %
HCT: 43 % (ref 39.0–52.0)
Hemoglobin: 14 g/dL (ref 13.0–17.0)
Immature Granulocytes: 1 %
Lymphocytes Relative: 17 %
Lymphs Abs: 1.3 10*3/uL (ref 0.7–4.0)
MCH: 30.8 pg (ref 26.0–34.0)
MCHC: 32.6 g/dL (ref 30.0–36.0)
MCV: 94.5 fL (ref 80.0–100.0)
Monocytes Absolute: 0.9 10*3/uL (ref 0.1–1.0)
Monocytes Relative: 12 %
Neutro Abs: 5.6 10*3/uL (ref 1.7–7.7)
Neutrophils Relative %: 68 %
Platelets: 225 10*3/uL (ref 150–400)
RBC: 4.55 MIL/uL (ref 4.22–5.81)
RDW: 13.3 % (ref 11.5–15.5)
WBC: 8 10*3/uL (ref 4.0–10.5)
nRBC: 0 % (ref 0.0–0.2)

## 2023-11-21 LAB — URINALYSIS, ROUTINE W REFLEX MICROSCOPIC
Bilirubin Urine: NEGATIVE
Glucose, UA: NEGATIVE mg/dL
Ketones, ur: NEGATIVE mg/dL
Nitrite: POSITIVE — AB
Protein, ur: NEGATIVE mg/dL
Specific Gravity, Urine: 1.014 (ref 1.005–1.030)
WBC, UA: >50 WBC/hpf (ref 0–5)
pH: 5 (ref 5.0–8.0)

## 2023-11-21 LAB — COMPREHENSIVE METABOLIC PANEL
ALT: 10 U/L (ref 0–44)
AST: 11 U/L — ABNORMAL LOW (ref 15–41)
Albumin: 3.9 g/dL (ref 3.5–5.0)
Alkaline Phosphatase: 59 U/L (ref 38–126)
Anion gap: 8 (ref 5–15)
BUN: 24 mg/dL — ABNORMAL HIGH (ref 8–23)
CO2: 26 mmol/L (ref 22–32)
Calcium: 9.3 mg/dL (ref 8.9–10.3)
Chloride: 101 mmol/L (ref 98–111)
Creatinine, Ser: 1.26 mg/dL — ABNORMAL HIGH (ref 0.61–1.24)
GFR, Estimated: 58 mL/min — ABNORMAL LOW (ref >60)
Glucose, Bld: 136 mg/dL — ABNORMAL HIGH (ref 70–99)
Potassium: 4.2 mmol/L (ref 3.5–5.1)
Sodium: 135 mmol/L (ref 135–145)
Total Bilirubin: 0.5 mg/dL (ref <1.2)
Total Protein: 7.3 g/dL (ref 6.5–8.1)

## 2023-11-21 MED ORDER — CIPROFLOXACIN HCL 500 MG PO TABS: 500.0000 mg | ORAL_TABLET | Freq: Two times a day (BID) | ORAL | 0 refills | Status: DC

## 2023-11-21 MED ORDER — CIPROFLOXACIN HCL 500 MG PO TABS: 500.0000 mg | ORAL_TABLET | Freq: Once | ORAL | Status: DC

## 2023-11-21 NOTE — ED Triage Notes (Signed)
In for eval of left groin pain with swelling in left testicle. Intermittent blood in urine. Reports  slip and fall while getting out of bed Friday before last, landed on his bottom. Unsure if these are related.

## 2023-11-21 NOTE — ED Provider Notes (Signed)
   Patient seen after prior EDP.  Patient with workup suggestive of epididymitis/UTI.  Patient given antibiotics.  Patient is known to Dr. Alvester Morin with urology.  He plans on following up with Dr. Alvester Morin tomorrow.  Importance of close follow-up is stressed.  Strict return precautions given and understood.   Wynetta Fines, MD 11/21/23 [phone]

## 2023-11-21 NOTE — ED Provider Notes (Signed)
[name] Provider Note   CSN: [id number] Arrival date & time: 11/21/23  1028     History  Chief Complaint  Patient presents with   Groin Pain    Douglas Hurst. is a 82 y.o. male.  HPI       82 year old male with history of type 2 diabetes, hypertension, hyperlipidemia, COPD, chronic diastolic heart failure, atrial fibrillation, AAA who presents with concern for left testicular pain.     Reports that 1 week ago on Friday he had a fall.  His feet with slick socks slid out from under him as he was getting out of bed and he fell onto his bottom per his wife.  He did not hit his head, no loss of consciousness, no headache, nausea, vomitin.  He tells me he is not on anticoagulation-although appears he does take Eliquis.  He denies any injuries from the fall, but the little bit sore the next day, but otherwise was doing okay. Hit his elbow but has been ok, no significant pian.   On Tuesday or Wednesday he noted testicular pain and swelling to the left side, reports that this pain has been increasing and this morning his pain was the most severe.  His wife looked at the area and felt that there was a knot in his scrotum.  He had hematuria on Thursday and called his urologist on Friday.  He had some hematuria yesterday as well.  Currently, he has no hematuria.  He denies abdominal pain, nausea, vomiting, diarrhea, constipation, fevers.    Past Medical History:  Diagnosis Date   Bipolar disorder (HCC)    Chronic back pain    Complication of anesthesia    prolonged sedation and confusion   COPD (chronic obstructive pulmonary disease) (HCC)    Dr. Valentina Lucks   Diabetes mellitus without complication [facility])    type 2   Diverticulosis    Dysrhythmia    Afib  onset 03-2022   ED (erectile dysfunction)    Gallstones    GERD (gastroesophageal reflux disease)    Hiatal hernia    Hyperlipidemia    Hypertension    Weakness 03/29/2023    Wears dentures    upper   Wears glasses    Wears hearing aid in both ears     Home Medications Prior to Admission medications   Medication Sig Start Date End Date Taking? Authorizing Provider  acetaminophen (TYLENOL) 325 MG tablet Take 2 tablets (650 mg total) by mouth every 6 (six) hours as needed for mild pain (or Fever >/= 101). 04/30/22   Sheikh, Omair Latif, DO  donepezil (ARICEPT) 5 MG tablet Take 1 tablet (5 mg total) by mouth at bedtime. 09/14/23   Dohmeier, Porfirio Mylar, MD  ELIQUIS 5 MG TABS tablet TAKE 1 TABLET BY MOUTH TWICE A DAY 07/02/23   Alver Sorrow, NP  finasteride (PROSCAR) 5 MG tablet Take 5 mg by mouth daily.    [provider]  furosemide (LASIX) 20 MG tablet Take 1 tablet (20 mg total) by mouth daily. 11/12/23   Jodelle Red, MD  Multiple Vitamin (MULTIVITAMIN WITH MINERALS) TABS tablet Take 1 tablet by mouth daily.    [provider]  pantoprazole (PROTONIX) 40 MG tablet Take 40 mg by mouth every morning.    [provider]  polyethylene glycol powder (GLYCOLAX/MIRALAX) 17 GM/SCOOP powder Take 17 g by mouth daily. Patient taking differently: Take 17 g by mouth daily as needed for mild  constipation or moderate constipation. 04/30/22   Sheikh, Omair Latif, DO  pravastatin (PRAVACHOL) 40 MG tablet TAKE 1 TABLET BY MOUTH EVERY DAY IN THE EVENING 08/16/23   Chilton Si, MD  SPIRIVA RESPIMAT 2.5 MCG/ACT AERS Inhale 2 puffs into the lungs daily as needed (COPD). 06/04/22   [provider]  spironolactone (ALDACTONE) 25 MG tablet Take 0.5 tablets (12.5 mg total) by mouth daily. 09/07/23 12/06/23  Alver Sorrow, NP  tadalafil (CIALIS) 20 MG tablet Take 20 mg by mouth every three (3) days as needed for erectile dysfunction. 03/29/22   [provider]  tamsulosin (FLOMAX) 0.4 MG CAPS capsule Take 0.4 mg by mouth daily after supper. 05/13/22   [provider]      Allergies    Patient has no known allergies.    Review  of Systems   Review of Systems  Physical Exam Updated Vital Signs BP 126/75 (BP Location: Left Arm)   Pulse 100   Temp 97.8 F (36.6 C)   Resp 17   Ht 6\' 1"  (1.854 m)   Wt 90.7 kg   SpO2 96%   BMI 26.39 kg/m  Physical Exam Vitals and nursing note reviewed.  Constitutional:      General: He is not in acute distress.    Appearance: Normal appearance. He is not ill-appearing, toxic-appearing or diaphoretic.  HENT:     Head: Normocephalic.  Eyes:     Conjunctiva/sclera: Conjunctivae normal.  Cardiovascular:     Rate and Rhythm: Normal rate and regular rhythm.     Pulses: Normal pulses.  Pulmonary:     Effort: Pulmonary effort is normal. No respiratory distress.  Abdominal:     General: Abdomen is flat. There is no distension.     Palpations: There is no mass.     Tenderness: There is no abdominal tenderness. There is no guarding.  Genitourinary:    Testes:        Right: Cremasteric reflex is present.         Left: Mass, tenderness and swelling present.  Musculoskeletal:        General: No deformity or signs of injury.     Cervical back: No rigidity.  Skin:    General: Skin is warm and dry.     Coloration: Skin is not jaundiced or pale.  Neurological:     General: No focal deficit present.     Mental Status: He is alert and oriented to person, place, and time.     ED Results / Procedures / Treatments   Labs (all labs ordered are listed, but only abnormal results are displayed) Labs Reviewed  CBC WITH DIFFERENTIAL/PLATELET  COMPREHENSIVE METABOLIC PANEL  URINALYSIS, ROUTINE W REFLEX MICROSCOPIC    EKG None  Radiology No results found.  Procedures Procedures    Medications Ordered in ED Medications - No data to display  ED Course/ Medical Decision Making/ A&P                                   82 year old male with history of type 2 diabetes, hypertension, hyperlipidemia, COPD, chronic diastolic heart failure, atrial fibrillation, AAA who presents  with concern for left testicular pain and hematuria.   Also discussed a fall that happened over a week ago, for which they deny any significant injuries.  He did not have any head trauma, has no headache and have low suspicion clinically for intracranial  hemorrhage.  They are unsure whether this fall is related to the testicular pain he is having now although the pain started 5 days later.  Differential diagnosis includes testicular torsion, epididymitis, abscess, orchitis, inguinal hernia, hydrocele.  He does not have abdominal pain, no nausea, vomiting, constipation or diarrhea and abdominal exam is benign, have low suspicion for bowel obstruction, nephrolithiasis, or other acute abdominal pathology.  I have ordered labs, including urinalysis which are pending.  Do not have ultrasound right now at [facility], and will send him to the [facility] emergency department for scrotal ultrasound.  Dr. Clarice Pole is excepting.         Final Clinical Impression(s) / ED Diagnoses Final diagnoses:  Scrotal pain  Hematuria, unspecified type    Rx / DC Orders ED Discharge Orders     None         Alvira Monday, MD 11/21/23 1257

## 2023-11-21 NOTE — Discharge Instructions (Addendum)
Return for any problem.  ?

## 2023-11-23 DIAGNOSIS — E1142 Type 2 diabetes mellitus with diabetic polyneuropathy: Secondary | ICD-10-CM | POA: Diagnosis not present

## 2023-11-23 DIAGNOSIS — J449 Chronic obstructive pulmonary disease, unspecified: Secondary | ICD-10-CM | POA: Diagnosis not present

## 2023-11-23 DIAGNOSIS — I480 Paroxysmal atrial fibrillation: Secondary | ICD-10-CM | POA: Diagnosis not present

## 2023-11-23 DIAGNOSIS — E1140 Type 2 diabetes mellitus with diabetic neuropathy, unspecified: Secondary | ICD-10-CM | POA: Diagnosis not present

## 2023-11-23 DIAGNOSIS — L729 Follicular cyst of the skin and subcutaneous tissue, unspecified: Secondary | ICD-10-CM | POA: Diagnosis not present

## 2023-11-23 DIAGNOSIS — R310 Gross hematuria: Secondary | ICD-10-CM | POA: Diagnosis not present

## 2023-11-23 DIAGNOSIS — I7143 Infrarenal abdominal aortic aneurysm, without rupture: Secondary | ICD-10-CM | POA: Diagnosis not present

## 2023-11-23 DIAGNOSIS — E1151 Type 2 diabetes mellitus with diabetic peripheral angiopathy without gangrene: Secondary | ICD-10-CM | POA: Diagnosis not present

## 2023-11-23 DIAGNOSIS — F319 Bipolar disorder, unspecified: Secondary | ICD-10-CM | POA: Diagnosis not present

## 2023-11-23 DIAGNOSIS — N451 Epididymitis: Secondary | ICD-10-CM | POA: Diagnosis not present

## 2023-11-30 DIAGNOSIS — R2689 Other abnormalities of gait and mobility: Secondary | ICD-10-CM | POA: Diagnosis not present

## 2023-11-30 DIAGNOSIS — M6281 Muscle weakness (generalized): Secondary | ICD-10-CM | POA: Diagnosis not present

## 2023-12-02 ENCOUNTER — Other Ambulatory Visit (HOSPITAL_BASED_OUTPATIENT_CLINIC_OR_DEPARTMENT_OTHER): Payer: Self-pay | Admitting: Family

## 2023-12-03 DIAGNOSIS — R310 Gross hematuria: Secondary | ICD-10-CM | POA: Diagnosis not present

## 2023-12-03 DIAGNOSIS — M6281 Muscle weakness (generalized): Secondary | ICD-10-CM | POA: Diagnosis not present

## 2023-12-03 DIAGNOSIS — N453 Epididymo-orchitis: Secondary | ICD-10-CM | POA: Diagnosis not present

## 2023-12-03 DIAGNOSIS — R2689 Other abnormalities of gait and mobility: Secondary | ICD-10-CM | POA: Diagnosis not present

## 2023-12-06 DIAGNOSIS — M6281 Muscle weakness (generalized): Secondary | ICD-10-CM | POA: Diagnosis not present

## 2023-12-06 DIAGNOSIS — R2689 Other abnormalities of gait and mobility: Secondary | ICD-10-CM | POA: Diagnosis not present

## 2023-12-08 DIAGNOSIS — R2689 Other abnormalities of gait and mobility: Secondary | ICD-10-CM | POA: Diagnosis not present

## 2023-12-08 DIAGNOSIS — M6281 Muscle weakness (generalized): Secondary | ICD-10-CM | POA: Diagnosis not present

## 2023-12-13 DIAGNOSIS — M6281 Muscle weakness (generalized): Secondary | ICD-10-CM | POA: Diagnosis not present

## 2023-12-13 DIAGNOSIS — R2689 Other abnormalities of gait and mobility: Secondary | ICD-10-CM | POA: Diagnosis not present

## 2023-12-15 DIAGNOSIS — M6281 Muscle weakness (generalized): Secondary | ICD-10-CM | POA: Diagnosis not present

## 2023-12-15 DIAGNOSIS — R2689 Other abnormalities of gait and mobility: Secondary | ICD-10-CM | POA: Diagnosis not present

## 2023-12-20 DIAGNOSIS — R2689 Other abnormalities of gait and mobility: Secondary | ICD-10-CM | POA: Diagnosis not present

## 2023-12-20 DIAGNOSIS — M6281 Muscle weakness (generalized): Secondary | ICD-10-CM | POA: Diagnosis not present

## 2023-12-24 DIAGNOSIS — M6281 Muscle weakness (generalized): Secondary | ICD-10-CM | POA: Diagnosis not present

## 2023-12-24 DIAGNOSIS — R2689 Other abnormalities of gait and mobility: Secondary | ICD-10-CM | POA: Diagnosis not present

## 2023-12-28 DIAGNOSIS — M6281 Muscle weakness (generalized): Secondary | ICD-10-CM | POA: Diagnosis not present

## 2023-12-28 DIAGNOSIS — R2689 Other abnormalities of gait and mobility: Secondary | ICD-10-CM | POA: Diagnosis not present

## 2023-12-31 DIAGNOSIS — M6281 Muscle weakness (generalized): Secondary | ICD-10-CM | POA: Diagnosis not present

## 2023-12-31 DIAGNOSIS — R2689 Other abnormalities of gait and mobility: Secondary | ICD-10-CM | POA: Diagnosis not present

## 2024-01-05 DIAGNOSIS — R2689 Other abnormalities of gait and mobility: Secondary | ICD-10-CM | POA: Diagnosis not present

## 2024-01-05 DIAGNOSIS — M6281 Muscle weakness (generalized): Secondary | ICD-10-CM | POA: Diagnosis not present

## 2024-01-07 DIAGNOSIS — M6281 Muscle weakness (generalized): Secondary | ICD-10-CM | POA: Diagnosis not present

## 2024-01-07 DIAGNOSIS — R2689 Other abnormalities of gait and mobility: Secondary | ICD-10-CM | POA: Diagnosis not present

## 2024-01-11 DIAGNOSIS — M6281 Muscle weakness (generalized): Secondary | ICD-10-CM | POA: Diagnosis not present

## 2024-01-11 DIAGNOSIS — R2689 Other abnormalities of gait and mobility: Secondary | ICD-10-CM | POA: Diagnosis not present

## 2024-01-13 DIAGNOSIS — N401 Enlarged prostate with lower urinary tract symptoms: Secondary | ICD-10-CM | POA: Diagnosis not present

## 2024-01-13 DIAGNOSIS — N453 Epididymo-orchitis: Secondary | ICD-10-CM | POA: Diagnosis not present

## 2024-01-13 DIAGNOSIS — R3915 Urgency of urination: Secondary | ICD-10-CM | POA: Diagnosis not present

## 2024-01-13 DIAGNOSIS — R351 Nocturia: Secondary | ICD-10-CM | POA: Diagnosis not present

## 2024-01-14 DIAGNOSIS — R2689 Other abnormalities of gait and mobility: Secondary | ICD-10-CM | POA: Diagnosis not present

## 2024-01-14 DIAGNOSIS — M6281 Muscle weakness (generalized): Secondary | ICD-10-CM | POA: Diagnosis not present

## 2024-01-18 DIAGNOSIS — M6281 Muscle weakness (generalized): Secondary | ICD-10-CM | POA: Diagnosis not present

## 2024-01-18 DIAGNOSIS — R2689 Other abnormalities of gait and mobility: Secondary | ICD-10-CM | POA: Diagnosis not present

## 2024-01-21 DIAGNOSIS — M6281 Muscle weakness (generalized): Secondary | ICD-10-CM | POA: Diagnosis not present

## 2024-01-21 DIAGNOSIS — R2689 Other abnormalities of gait and mobility: Secondary | ICD-10-CM | POA: Diagnosis not present

## 2024-01-25 DIAGNOSIS — M6281 Muscle weakness (generalized): Secondary | ICD-10-CM | POA: Diagnosis not present

## 2024-01-25 DIAGNOSIS — R2689 Other abnormalities of gait and mobility: Secondary | ICD-10-CM | POA: Diagnosis not present

## 2024-01-26 ENCOUNTER — Other Ambulatory Visit: Payer: Self-pay | Admitting: Neurology

## 2024-01-27 NOTE — Telephone Encounter (Signed)
Last seen on 09/13/24 Follow up scheduled on 04/06/24

## 2024-01-28 DIAGNOSIS — M6281 Muscle weakness (generalized): Secondary | ICD-10-CM | POA: Diagnosis not present

## 2024-01-28 DIAGNOSIS — R2689 Other abnormalities of gait and mobility: Secondary | ICD-10-CM | POA: Diagnosis not present

## 2024-02-01 DIAGNOSIS — R2689 Other abnormalities of gait and mobility: Secondary | ICD-10-CM | POA: Diagnosis not present

## 2024-02-01 DIAGNOSIS — M6281 Muscle weakness (generalized): Secondary | ICD-10-CM | POA: Diagnosis not present

## 2024-02-03 DIAGNOSIS — M6281 Muscle weakness (generalized): Secondary | ICD-10-CM | POA: Diagnosis not present

## 2024-02-03 DIAGNOSIS — R2689 Other abnormalities of gait and mobility: Secondary | ICD-10-CM | POA: Diagnosis not present

## 2024-02-08 DIAGNOSIS — R2689 Other abnormalities of gait and mobility: Secondary | ICD-10-CM | POA: Diagnosis not present

## 2024-02-08 DIAGNOSIS — M6281 Muscle weakness (generalized): Secondary | ICD-10-CM | POA: Diagnosis not present

## 2024-02-15 DIAGNOSIS — M6281 Muscle weakness (generalized): Secondary | ICD-10-CM | POA: Diagnosis not present

## 2024-02-15 DIAGNOSIS — R2689 Other abnormalities of gait and mobility: Secondary | ICD-10-CM | POA: Diagnosis not present

## 2024-02-18 ENCOUNTER — Other Ambulatory Visit (HOSPITAL_BASED_OUTPATIENT_CLINIC_OR_DEPARTMENT_OTHER): Payer: Self-pay | Admitting: Family

## 2024-02-18 ENCOUNTER — Other Ambulatory Visit: Payer: Self-pay | Admitting: Neurology

## 2024-02-21 NOTE — Telephone Encounter (Addendum)
 Last seen on 04/14/23 Follow up scheduled on 04/06/24 Rx was just filled on 02/15/24

## 2024-02-22 DIAGNOSIS — R2689 Other abnormalities of gait and mobility: Secondary | ICD-10-CM | POA: Diagnosis not present

## 2024-02-22 DIAGNOSIS — M6281 Muscle weakness (generalized): Secondary | ICD-10-CM | POA: Diagnosis not present

## 2024-02-24 DIAGNOSIS — R2689 Other abnormalities of gait and mobility: Secondary | ICD-10-CM | POA: Diagnosis not present

## 2024-02-24 DIAGNOSIS — M6281 Muscle weakness (generalized): Secondary | ICD-10-CM | POA: Diagnosis not present

## 2024-02-29 DIAGNOSIS — M6281 Muscle weakness (generalized): Secondary | ICD-10-CM | POA: Diagnosis not present

## 2024-02-29 DIAGNOSIS — R2689 Other abnormalities of gait and mobility: Secondary | ICD-10-CM | POA: Diagnosis not present

## 2024-03-02 DIAGNOSIS — R2689 Other abnormalities of gait and mobility: Secondary | ICD-10-CM | POA: Diagnosis not present

## 2024-03-02 DIAGNOSIS — M6281 Muscle weakness (generalized): Secondary | ICD-10-CM | POA: Diagnosis not present

## 2024-03-07 DIAGNOSIS — R2689 Other abnormalities of gait and mobility: Secondary | ICD-10-CM | POA: Diagnosis not present

## 2024-03-07 DIAGNOSIS — M6281 Muscle weakness (generalized): Secondary | ICD-10-CM | POA: Diagnosis not present

## 2024-03-09 ENCOUNTER — Other Ambulatory Visit: Payer: Self-pay | Admitting: Family

## 2024-03-09 DIAGNOSIS — I5032 Chronic diastolic (congestive) heart failure: Secondary | ICD-10-CM

## 2024-03-09 DIAGNOSIS — M6281 Muscle weakness (generalized): Secondary | ICD-10-CM | POA: Diagnosis not present

## 2024-03-09 DIAGNOSIS — R2689 Other abnormalities of gait and mobility: Secondary | ICD-10-CM | POA: Diagnosis not present

## 2024-03-14 DIAGNOSIS — R2689 Other abnormalities of gait and mobility: Secondary | ICD-10-CM | POA: Diagnosis not present

## 2024-03-14 DIAGNOSIS — M6281 Muscle weakness (generalized): Secondary | ICD-10-CM | POA: Diagnosis not present

## 2024-03-16 DIAGNOSIS — R2689 Other abnormalities of gait and mobility: Secondary | ICD-10-CM | POA: Diagnosis not present

## 2024-03-16 DIAGNOSIS — M6281 Muscle weakness (generalized): Secondary | ICD-10-CM | POA: Diagnosis not present

## 2024-04-06 ENCOUNTER — Ambulatory Visit: Payer: Medicare HMO | Admitting: Adult Health

## 2024-04-06 ENCOUNTER — Encounter: Payer: Self-pay | Admitting: Adult Health

## 2024-04-06 VITALS — BP 133/77 | HR 87 | Ht 73.0 in | Wt 206.0 lb

## 2024-04-06 DIAGNOSIS — G309 Alzheimer's disease, unspecified: Secondary | ICD-10-CM

## 2024-04-06 DIAGNOSIS — G3184 Mild cognitive impairment, so stated: Secondary | ICD-10-CM | POA: Diagnosis not present

## 2024-04-06 MED ORDER — DONEPEZIL HCL 10 MG PO TABS: 10.0000 mg | ORAL_TABLET | Freq: Every day | ORAL | 5 refills | Status: DC

## 2024-04-06 NOTE — Progress Notes (Signed)
 [facility] [address] [place]. [place] [address] [phone]       OFFICE FOLLOW UP NOTE  [name]. Date of Birth:  03-09-1941 Medical Record Number:  [id number]    Primary neurologist: Dr. Vickey Huger Reason for visit: Cognitive impairment    SUBJECTIVE:  CHIEF COMPLAINT:  Chief Complaint  Patient presents with   Memory Loss    Rm 8 with spouse Pt is well and stable, reports no new concerns. Memory is stable.  Pt questions coming off of Aricept.     HPI:    Update 04/06/2024 JM: Patient returns for 26-month follow-up visit accompanied by his wife.  Both he and his wife feel like his memory has improved since the prior visit, wife mentions his overall communication and wanting to be more active has also improved. MMSE 19/30 (prior 20/30). She feels his gait has been better and more stable since working with PT, no recent falls. He can occasionally become agitated with wife but more so when he is questioned on something he is doing.  Reports he sleeps well and has a good appetite.  He questions being able to return to driving, he was driving previously without any difficulty.  He has primarily wanting to drive just short distance to resume having breakfast with his friends locally.  Even prior, he was not driving any long distance and avoided nighttime driving.  ATN profile consistent with Alzheimer's disease  MRI brain showed mild generalized cortical atrophy, moderate microvascular ischemic changes and chronic lacunar infarcts.   EEG impaired validity by motion artifact but no epileptiform discharges      History provided for reference purposes only Update 09/14/2023 Dr. Vickey Huger: Douglas Hurst. is a 83 y.o. male patient who is here for revisit 09/14/2023 for  memory check up.    Chief concern according to patient :  He is not very conversational, but has gained some weight and has made progress in ambulation with PT.  He  is not in  pain. His MMSE today was 20/ 30 points.   Consult visit 04/14/2019 for Dr. Vickey Huger: Douglas Hurst. is a 83 y.o. male and seen here in the presence of spouse and niece -upon referral from Dr. Orson Aloe for a Consultation/ Evaluation of Dementia. Mr. Douglas, Hurst. was seen here today in a first consultation dedicated to evaluate him for possible dementia.  Today is 14 April 2023 and a large part of our conversation today was to establish the history of present illness.  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 in April 2023 this all changed and he became severely ill.   The follow-up care included physical therapy and some reduction in medication.    I was very happy to hear that this gentleman 14 days ago was still bedridden and now can walk with a walker.  I do wonder if the discontinuation of amiodarone would have made this quick recovery possible but it is not impossible that amiodarone is the main culprit for some of his progressed physical ailments.  He also is no longer on metoprolol and on statins and he can now eat regular food and keep it down. He remains on Eloquis.  [name] is a left handed individual with worsening cognitive decline over 12-18 months , and with physical  decline as well.   Douglas Hewins. is a 83 y.o. male and seen here in the presence of spouse and niece -upon referral from Dr. Orson Aloe for a Consultation/ Evaluation of Dementia. [name] is a left handed individual with worsening cognitive decline over 12-18 months , and with physical decline as well.   The more recent health history for [name] is important in the context of his cognitive and physical developments.  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 he was never that great with numbers it seems but he was very much in tune with his daily appointments date fully oriented until April 2023.  In April 2023 he had a fainting spell at home he collapsed, EMS had to bring him to the local hospital , and gallstones were diagnosed. He was discharged after unsuccessfully attempting the removal.  He went to [facility] to rehabilitation for 14 days, but there were mix ups with his medication.    He still had gallbladder obstruction, the gallstones were not successfully removed and [facility]  He was transferred to use and see [facility].  There the stones were removed but not the gallbladder -He also developed sepsis and pancreatitis as the pancreas was injured , and with this peritonitis he developed atrial fibrillation.  He was transferred back to [facility] and here the cholecystectomy was performed , the  peritonitis was discovered.   He was very ill and had to be fed through a feeding tube.   This time  transfer [facility].    He lost a significant amount of weight, he became more tremulous and his cognition has declined rapidly as well as his physical stamina.  Today's [place] cognitive assessment test could not be completed.  We also obtained a Mini-Mental status examination and he scored 12 out of 30 points which is very low.  I am not quite sure that this is his true baseline most recently some changes had been made to medication and he also received a new diagnosis of anemia.  This diagnoses apparently has not been shared with his wife for him.  His daily living activities are fairly impaired he does travel by taxi or automobile with assistance of others, he does not do laundry he can dial a few well-known numbers but does usually not operate a smart phone.  He needs to be accompanied on any shopping trips.  Meals have to be prepared and served, he does not participate in any  housekeeping tasks, and he is incapable of dispensing his own medication or handling his own financial affairs. His wife stated she has always handled these things      ROS:   14 system review of systems performed and negative with exception of those listed in HPI  PMH:  Past Medical History:  Diagnosis Date   Bipolar disorder (HCC)    Chronic back pain    Complication of anesthesia    prolonged sedation and confusion   COPD (chronic obstructive pulmonary disease) (HCC)    Dr. Valentina Lucks   Diabetes mellitus without complication (HCC)    type 2   Diverticulosis    Dysrhythmia    Afib  onset 03-2022   ED (erectile dysfunction)    Gallstones    GERD (gastroesophageal reflux disease)    Hiatal hernia  Hyperlipidemia    Hypertension    Weakness 03/29/2023   Wears dentures    upper   Wears glasses    Wears hearing aid in both ears     PSH:  Past Surgical History:  Procedure Laterality Date   BALLOON DILATION N/A 10/01/2015   Procedure: BALLOON DILATION;  Surgeon: Charolett Bumpers, MD;  Location: [facility];  Service: Endoscopy;  Laterality: N/A;   CATARACT EXTRACTION, BILATERAL     CHOLECYSTECTOMY N/A 07/08/2022   Procedure: LAPAROSCOPIC CHOLECYSTECTOMY WITH INTRAOPERATIVE CHOLANGIOGRAM AND LAPAROSCOPIC COMMON DUCT EXPLORATION;  Surgeon: Quentin Ore, MD;  Location: [facility]S;  Service: General;  Laterality: N/A;   COLONOSCOPY     removed polyps   COLONOSCOPY WITH PROPOFOL N/A 09/01/2016   Procedure: COLONOSCOPY WITH PROPOFOL;  Surgeon: Charolett Bumpers, MD;  Location: [facility];  Service: Endoscopy;  Laterality: N/A;   ERCP N/A 07/09/2022   Procedure: ENDOSCOPIC RETROGRADE CHOLANGIOPANCREATOGRAPHY (ERCP);  Surgeon: Vida Rigger, MD;  Location: [facility];  Service: Gastroenterology;  Laterality: N/A;   ESOPHAGEAL DILATION  06/23/2022   Procedure: PYLORIC  DILATION;  Surgeon: Vida Rigger, MD;  Location: [facility];  Service: Gastroenterology;;    ESOPHAGOGASTRODUODENOSCOPY N/A 06/23/2022   Procedure: ESOPHAGOGASTRODUODENOSCOPY (EGD);  Surgeon: Vida Rigger, MD;  Location: [facility];  Service: Gastroenterology;  Laterality: N/A;   ESOPHAGOGASTRODUODENOSCOPY (EGD) WITH PROPOFOL N/A 10/01/2015   Procedure: ESOPHAGOGASTRODUODENOSCOPY (EGD) WITH PROPOFOL;  Surgeon: Charolett Bumpers, MD;  Location: [facility];  Service: Endoscopy;  Laterality: N/A;   FISTULOTOMY  07/09/2022   Procedure: FISTULOTOMY;  Surgeon: Vida Rigger, MD;  Location: [facility];  Service: Gastroenterology;;   HEMOSTASIS CLIP PLACEMENT  06/23/2022   Procedure: HEMOSTASIS CLIP PLACEMENT;  Surgeon: Vida Rigger, MD;  Location: [facility];  Service: Gastroenterology;;   IR CATHETER TUBE CHANGE  08/20/2022   IR EXCHANGE BILIARY DRAIN  05/15/2022   IR EXCHANGE BILIARY DRAIN  05/26/2022   IR EXCHANGE BILIARY DRAIN  06/11/2022   IR EXCHANGE BILIARY DRAIN  06/23/2022   IR PERC CHOLECYSTOSTOMY  04/26/2022   IR RADIOLOGIST EVAL & MGMT  09/16/2022   KNEE ARTHROSCOPY     TONSILLECTOMY      Social History:  Social History   Socioeconomic History   Marital status: Married    Spouse name: Not on file   Number of children: Not on file   Years of education: Not on file   Highest education level: Not on file  Occupational History   Not on file  Tobacco Use   Smoking status: Former    Types: Cigarettes   Smokeless tobacco: Never  Vaping Use   Vaping status: Never Used  Substance and Sexual Activity   Alcohol use: No   Drug use: No   Sexual activity: Not Currently  Other Topics Concern   Not on file  Social History Narrative   Not on file   Social Drivers of Health   Financial Resource Strain: Not on file  Food Insecurity: Not on file  Transportation Needs: Not on file  Physical Activity: Not on file  Stress: Not on file  Social Connections: Not on file  Intimate Partner Violence: Not on file    Family History:  Family History  Problem Relation Age of Onset    Heart attack Mother    Cancer - Colon Mother    CAD Mother    Cancer Mother    Dementia Mother    CAD Father    Cancer Father    CVA  Father    Dementia Father    Heart attack Brother    CAD Brother     Medications:   Current Outpatient Medications on File Prior to Visit  Medication Sig Dispense Refill   acetaminophen (TYLENOL) 325 MG tablet Take 2 tablets (650 mg total) by mouth every 6 (six) hours as needed for mild pain (or Fever >/= 101). 20 tablet 0   ELIQUIS 5 MG TABS tablet TAKE 1 TABLET BY MOUTH TWICE A DAY 180 tablet 3   finasteride (PROSCAR) 5 MG tablet Take 5 mg by mouth daily.     furosemide (LASIX) 20 MG tablet Take 1 tablet (20 mg total) by mouth daily. 90 tablet 3   Multiple Vitamin (MULTIVITAMIN WITH MINERALS) TABS tablet Take 1 tablet by mouth daily.     polyethylene glycol powder (GLYCOLAX/MIRALAX) 17 GM/SCOOP powder Take 17 g by mouth daily. (Patient taking differently: Take 17 g by mouth daily as needed for mild constipation or moderate constipation.) 238 g 0   pravastatin (PRAVACHOL) 40 MG tablet TAKE 1 TABLET BY MOUTH EVERY DAY IN THE EVENING 90 tablet 2   SPIRIVA RESPIMAT 2.5 MCG/ACT AERS Inhale 2 puffs into the lungs daily as needed (COPD).     spironolactone (ALDACTONE) 25 MG tablet TAKE 1 TABLET (25 MG TOTAL) BY MOUTH DAILY. 90 tablet 3   tadalafil (CIALIS) 20 MG tablet Take 20 mg by mouth every three (3) days as needed for erectile dysfunction.     tamsulosin (FLOMAX) 0.4 MG CAPS capsule Take 0.4 mg by mouth daily after supper.     No current facility-administered medications on file prior to visit.    Allergies:  No Known Allergies    OBJECTIVE:  Physical Exam  Vitals:   04/06/24 1057  BP: 133/77  Pulse: 87  Weight: 206 lb (93.4 kg)  Height: 6\' 1"  (1.854 m)   Body mass index is 27.18 kg/m. No results found.   General: well developed, well nourished, pleasant elderly Caucasian male, seated, in no evident distress  Neurologic Exam Mental  Status: Awake and fully alert. Oriented to place and time. Recent and remote memory intact. Attention span, concentration and fund of knowledge appropriate. Mood and affect appropriate.  Cranial Nerves: Pupils equal, briskly reactive to light. Extraocular movements full without nystagmus. Visual fields full to confrontation. Hearing intact. Facial sensation intact. Face, tongue, palate moves normally and symmetrically.  Motor: Normal bulk and tone. Normal strength in all tested extremity muscles.  Mild postural tremor in upper extremities. Sensory.: intact to touch , pinprick , position and vibratory sensation.  Coordination: Rapid alternating movements normal in all extremities set slightly decreased in upper extremities. Gait and Station: Arises from chair without difficulty. Stance is slightly hunched. Gait demonstrates normal stride length and balance without use of AD. Reflexes: 1+ and symmetric. Toes downgoing.      04/06/2024   10:59 AM 09/14/2023    9:53 AM  MMSE - Mini Mental State Exam  Orientation to time 2 1  Orientation to Place 3 4  Registration 3 3  Attention/ Calculation 2 3  Recall 1 1  Language- name 2 objects 2 2  Language- repeat 1 1  Language- follow 3 step command 3 3  Language- read & follow direction 1 1  Write a sentence 1 1  Copy design 0 0  Total score 19 20         ASSESSMENT/PLAN: Douglas Hurst. is a 83 y.o. year old male  Mild cognitive impairment due to Alzheimer's disease:  MMSE today 19/30 (prior 20/30 08/2023 and 13/30 03/2023 showing overall significant improvement over the past year) Continue donepezil but will increase to 10 mg nightly therapeutic dose ATN profile consistent with Alzheimer's disease MRI brain mild to moderate generalized cortical atrophy, moderate microvascular ischemic changes and chronic lacunar infarcts EEG no epileptiform discharges  Prolonged discussion regarding return to driving, I believe he would be okay to  gradually return back to driving but did advise to have a license driver with him for the first few times to ensure reaction time and multitasking is not impaired.  Would recommend restricting driving to only short distance, daytime, not busy times such as rush hour and avoidance of mangoes and highways.  If there are any difficulty with returning to driving, if he gets lost to familiar places or driving concerns arise, would advise refraining from driving and pursuing formal driving evaluation.   patient and wife verbalized understanding     Follow up in 6 months or call earlier if needed   CC:  PCP: Emilio Aspen, MD    I spent 30 minutes of face-to-face and non-face-to-face time with patient and wife.  This included previsit chart review, lab review, study review, order entry, electronic health record documentation, patient education and discussion regarding above diagnoses and treatment plan and answered all other questions to patient's satisfaction  Ihor Austin, [facility]  [facility] [address] [place], [place] [address]  Phone [phone] Fax [phone] Note: This document was prepared with digital dictation and possible smart phrase technology. Any transcriptional errors that result from this process are unintentional.

## 2024-04-06 NOTE — Patient Instructions (Addendum)
 Your Plan:  Increase Aricept to 10mg  nightly   Okay to gradually return back to driving. Have a license driver ride with you for the first couple of times to monitor for reaction time and multitasking. If doing okay with that, can return back to driving short distance but avoid main highways and roads, avoid driving during busy times of the day and night time driving.     Follow up in 6 months or call earlier if needed     Thank you for coming to see [place] at [facility]. I hope we have been able to provide you high quality care today.  You may receive a patient satisfaction survey over the next few weeks. We would appreciate your feedback and comments so that we may continue to improve ourselves and the health of our patients.

## 2024-04-19 ENCOUNTER — Ambulatory Visit: Payer: Medicare HMO | Admitting: Physician Assistant

## 2024-04-19 ENCOUNTER — Ambulatory Visit (HOSPITAL_BASED_OUTPATIENT_CLINIC_OR_DEPARTMENT_OTHER)
Admission: RE | Admit: 2024-04-19 | Discharge: 2024-04-19 | Discharge status: Home / Self Care | Payer: Medicare HMO | Source: Ambulatory Visit | Attending: Vascular Surgery

## 2024-04-19 ENCOUNTER — Ambulatory Visit (HOSPITAL_COMMUNITY)
Admission: RE | Admit: 2024-04-19 | Discharge: 2024-04-19 | Disposition: A | Discharge status: Home / Self Care | Payer: Medicare HMO | Source: Ambulatory Visit | Attending: Vascular Surgery | Admitting: Vascular Surgery

## 2024-04-19 VITALS — BP 156/100 | HR 93 | Temp 97.8°F | Ht 73.0 in | Wt 205.1 lb

## 2024-04-19 DIAGNOSIS — M79605 Pain in left leg: Secondary | ICD-10-CM

## 2024-04-19 DIAGNOSIS — I7143 Infrarenal abdominal aortic aneurysm, without rupture: Secondary | ICD-10-CM

## 2024-04-19 DIAGNOSIS — M79604 Pain in right leg: Secondary | ICD-10-CM | POA: Diagnosis not present

## 2024-04-19 LAB — VAS US ABI WITH/WO TBI
Left ABI: 0.99
Right ABI: 0.99

## 2024-04-19 NOTE — Progress Notes (Signed)
 HISTORY AND PHYSICAL     CC:  follow up. Requesting Provider:  Benedetta Hurst, *  HPI: This is a 83 y.o. male who is here today for follow up for PAD.    He was seen by Dr. 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  He was living in a SNF.  He did walk some with some limitation.  Pt was scheduled for one year f/u with duplex.   The pt returns today for follow up and here with his wife.  He denies any new abdominal or back pain.  He denies any pain in his legs with walking or ulcerations on his feet or rest pain.  He states that his BP at home has been well controlled.    The pt is on a statin for cholesterol management.    The pt is not on an aspirin.    Other AC:  Eliquis  The pt is on diuretic for hypertension.  The pt is not on medication for diabetes. Tobacco hx:  former  Pt does not have family hx of AAA.  Past Medical History:  Diagnosis Date   Bipolar disorder (HCC)    Chronic back pain    Complication of anesthesia    prolonged sedation and confusion   COPD (chronic obstructive pulmonary disease) (HCC)    Dr. 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   Diverticulosis    Dysrhythmia    Afib  onset 03-2022   ED (erectile dysfunction)    Gallstones    GERD (gastroesophageal reflux disease)    Hiatal hernia    Hyperlipidemia    Hypertension    Weakness 03/29/2023   Wears dentures    upper   Wears glasses    Wears hearing aid in both ears     Past Surgical History:  Procedure Laterality Date   BALLOON DILATION N/A 10/01/2015   Procedure: BALLOON DILATION;  Surgeon: Garrett Kallman, MD;  Location: [facility];  Service: Endoscopy;  Laterality: N/A;   CATARACT EXTRACTION, BILATERAL     CHOLECYSTECTOMY N/A 07/08/2022   Procedure: LAPAROSCOPIC CHOLECYSTECTOMY WITH INTRAOPERATIVE CHOLANGIOGRAM AND LAPAROSCOPIC COMMON DUCT EXPLORATION;  Surgeon: Junie Olds, MD;  Location:  [facility];  Service: General;  Laterality: N/A;   COLONOSCOPY     removed polyps   COLONOSCOPY WITH PROPOFOL  N/A 09/01/2016   Procedure: COLONOSCOPY WITH PROPOFOL ;  Surgeon: Garrett Kallman, MD;  Location: [facility];  Service: Endoscopy;  Laterality: N/A;   ERCP N/A 07/09/2022   Procedure: ENDOSCOPIC RETROGRADE CHOLANGIOPANCREATOGRAPHY (ERCP);  Surgeon: Ozell Blunt, MD;  Location: [facility];  Service: Gastroenterology;  Laterality: N/A;   ESOPHAGEAL DILATION  06/23/2022   Procedure: PYLORIC  DILATION;  Surgeon: Ozell Blunt, MD;  Location: [facility];  Service: Gastroenterology;;   ESOPHAGOGASTRODUODENOSCOPY N/A 06/23/2022   Procedure: ESOPHAGOGASTRODUODENOSCOPY (EGD);  Surgeon: Ozell Blunt, MD;  Location: [facility];  Service: Gastroenterology;  Laterality: N/A;   ESOPHAGOGASTRODUODENOSCOPY (EGD) WITH PROPOFOL  N/A 10/01/2015   Procedure: ESOPHAGOGASTRODUODENOSCOPY (EGD) WITH PROPOFOL ;  Surgeon: Garrett Kallman, MD;  Location: [facility];  Service: Endoscopy;  Laterality: N/A;   FISTULOTOMY  07/09/2022   Procedure: FISTULOTOMY;  Surgeon: Ozell Blunt, MD;  Location: [facility];  Service: Gastroenterology;;   HEMOSTASIS CLIP PLACEMENT  06/23/2022   Procedure: HEMOSTASIS CLIP PLACEMENT;  Surgeon: Ozell Blunt, MD;  Location: [facility];  Service: Gastroenterology;;   IR CATHETER TUBE CHANGE  08/20/2022   IR EXCHANGE BILIARY DRAIN  05/15/2022   IR EXCHANGE BILIARY DRAIN  05/26/2022   IR EXCHANGE BILIARY DRAIN  06/11/2022   IR EXCHANGE BILIARY DRAIN  06/23/2022   IR PERC CHOLECYSTOSTOMY  04/26/2022   IR RADIOLOGIST EVAL & MGMT  09/16/2022   KNEE ARTHROSCOPY     TONSILLECTOMY      No Known Allergies  Current Outpatient Medications  Medication Sig Dispense Refill   acetaminophen  (TYLENOL ) 325 MG tablet Take 2 tablets (650 mg total) by mouth every 6 (six) hours as needed for mild pain (or Fever >/= 101). 20 tablet 0   donepezil  (ARICEPT ) 10 MG tablet Take 1 tablet (10 mg total) by mouth at  bedtime. 30 tablet 5   ELIQUIS  5 MG TABS tablet TAKE 1 TABLET BY MOUTH TWICE A DAY 180 tablet 3   finasteride (PROSCAR) 5 MG tablet Take 5 mg by mouth daily.     furosemide  (LASIX ) 20 MG tablet Take 1 tablet (20 mg total) by mouth daily. 90 tablet 3   Multiple Vitamin (MULTIVITAMIN WITH MINERALS) TABS tablet Take 1 tablet by mouth daily.     polyethylene glycol powder (GLYCOLAX /MIRALAX ) 17 GM/SCOOP powder Take 17 g by mouth daily. (Patient taking differently: Take 17 g by mouth daily as needed for mild constipation or moderate constipation.) 238 g 0   pravastatin  (PRAVACHOL ) 40 MG tablet TAKE 1 TABLET BY MOUTH EVERY DAY IN THE EVENING 90 tablet 2   SPIRIVA  RESPIMAT 2.5 MCG/ACT AERS Inhale 2 puffs into the lungs daily as needed (COPD).     spironolactone  (ALDACTONE ) 25 MG tablet TAKE 1 TABLET (25 MG TOTAL) BY MOUTH DAILY. 90 tablet 3   tadalafil (CIALIS) 20 MG tablet Take 20 mg by mouth every three (3) days as needed for erectile dysfunction.     tamsulosin  (FLOMAX ) 0.4 MG CAPS capsule Take 0.4 mg by mouth daily after supper.     No current facility-administered medications for this visit.    Family History  Problem Relation Age of Onset   Heart attack Mother    Cancer - Colon Mother    CAD Mother    Cancer Mother    Dementia Mother    CAD Father    Cancer Father    CVA Father    Dementia Father    Heart attack Brother    CAD Brother     Social History   Socioeconomic History   Marital status: Married    Spouse name: Not on file   Number of children: Not on file   Years of education: Not on file   Highest education level: Not on file  Occupational History   Not on file  Tobacco Use   Smoking status: Former    Types: Cigarettes   Smokeless tobacco: Never  Vaping Use   Vaping status: Never Used  Substance and Sexual Activity   Alcohol use: No   Drug use: No   Sexual activity: Not Currently  Other Topics Concern   Not on file  Social History Narrative   Not on file    Social Drivers of Health   Financial Resource Strain: Not on file  Food Insecurity: Not on file  Transportation Needs: Not on file  Physical Activity: Not on file  Stress: Not on file  Social Connections: Not on file  Intimate Partner Violence: Not on file     REVIEW OF SYSTEMS:   [X]  denotes positive finding, [ ]  denotes negative finding Cardiac  Comments:  Chest pain or chest pressure:    Shortness of breath upon exertion:    Short of breath when lying flat:    Irregular heart rhythm: x       Vascular    Pain in calf, thigh, or hip brought on by ambulation:    Pain in feet at night that wakes you up from your sleep:     Blood clot in your veins:    Leg swelling:         Pulmonary    Oxygen at home:    Productive cough:     Wheezing:         Neurologic    Sudden weakness in arms or legs:     Sudden numbness in arms or legs:     Sudden onset of difficulty speaking or slurred speech:    Temporary loss of vision in one eye:     Problems with dizziness:         Gastrointestinal    Blood in stool:     Vomited blood:         Genitourinary    Burning when urinating:     Blood in urine:        Psychiatric    Major depression:         Hematologic    Bleeding problems:    Problems with blood clotting too easily:        Skin    Rashes or ulcers:        Constitutional    Fever or chills:      PHYSICAL EXAMINATION:  Today's Vitals   04/19/24 0954  BP: (!) 156/100  Pulse: 93  Temp: 97.8 F (36.6 C)  TempSrc: Temporal  SpO2: 94%  Weight: 205 lb 1.6 oz (93 kg)  Height: 6\' 1"  (1.854 m)   Body mass index is 27.06 kg/m.   General:  WDWN in NAD; vital signs documented above Gait: Not observed HENT: WNL, normocephalic Pulmonary: normal non-labored breathing , without wheezing Cardiac: regular HR, without carotid bruits Abdomen: soft, NT; aortic pulse is not palpable Skin: without rashes Vascular Exam/Pulses:  Right Left  Radial 2+ (normal) 2+  (normal)  Femoral 1+ (weak) 1+ (weak)  Popliteal Unable to palpate Unable to palpate  PT 1+ (weak) 1+ (weak)   Extremities: without ischemic changes, without Gangrene , without cellulitis; without open wounds Musculoskeletal: no muscle wasting or atrophy  Neurologic: A&O X 3 Psychiatric:  The pt has Normal affect.   Non-Invasive Vascular Imaging:   ABI's/TBI's on 04/19/2024: Right:  0.99/0.58 - Great toe pressure: 88 Left:  0.99/0.70 - Great toe pressure: 107  Arterial duplex on 04/19/2024: Abdominal Aorta Findings:  +-----------+-------+----------+----------+--------+--------+--------+  Location  AP (cm)Trans (cm)PSV (cm/s)WaveformThrombusComments  +-----------+-------+----------+----------+--------+--------+--------+  Proximal  1.97   1.69      66                                  +-----------+-------+----------+----------+--------+--------+--------+  Mid       1.87   1.99      40                                  +-----------+-------+----------+----------+--------+--------+--------+  Distal    4.30   4.50      31                                  +-----------+-------+----------+----------+--------+--------+--------+  RT CIA Prox2.1    2.2       53                                  +-----------+-------+----------+----------+--------+--------+--------+  LT CIA Prox1.7    1.7       49                                  +-----------+-------+----------+----------+--------+--------+--------+    Summary:  Abdominal Aorta: There is evidence of abnormal dilatation of the distal Abdominal aorta. Previous diameter measurement was obtained on 10/13/23: 4.3 x 4.5 cm.    Previous arterial duplex on 10/13/2023: Abdominal Aorta Findings:  +-------------+-------+----------+----------+----------+--------+--------+  Location    AP (cm)Trans (cm)PSV (cm/s)Waveform  ThrombusComments   +-------------+-------+----------+----------+----------+--------+--------+  Proximal    2.27   2.26      71                                    +-------------+-------+----------+----------+----------+--------+--------+  Mid         2.60   2.88      47                                    +-------------+-------+----------+----------+----------+--------+--------+  Distal      4.03   4.50      36                                    +-------------+-------+----------+----------+----------+--------+--------+  RT CIA Prox  1.6    1.6       59        monophasic                  +-------------+-------+----------+----------+----------+--------+--------+  RT CIA Mid   2.2    2.0                                             +-------------+-------+----------+----------+----------+--------+--------+  RT EIA Prox                   99        monophasic                  +-------------+-------+----------+----------+----------+--------+--------+  RT EIA Distal                 140       biphasic                    +-------------+-------+----------+----------+----------+--------+--------+  LT CIA Prox  1.6    1.7       70        monophasic                  +-------------+-------+----------+----------+----------+--------+--------+  LT CIA Distal                 161       biphasic                    +-------------+-------+----------+----------+----------+--------+--------+  LT EIA Mid                    139       biphasic                    +-------------+-------+----------+----------+----------+--------+--------+  LT EIA Distal                 103       biphasic                    +-------------+-------+----------+----------+----------+--------+--------+      ASSESSMENT/PLAN:: 83 y.o. male here for follow up for AAA as he was seen by Dr. Vikki Graves on 09/30/2022 and had recently had pancreatitis and was found to have a AAA that  measured 4cm.  He was living in a SNF.  He did walk some with some limitation.  Pt was scheduled for one year f/u with duplex.    -pt doing well without new back or abdominal pain.  Aneurysm stable at 4.5cm.  ABI's look good with multiphasic doppler flow.   -He will need u/s of his popliteal arteries in the future given AAA.   -discussed if he develops new severe abdominal or back pain, he needs to call 911.  -continue statin.  Pt is on Eliquis . -f/u in 6 months with AAA and BLE arterial duplex. He will call sooner if any issues before then.     Douglas Hurst, [facility] [phone]  Clinic MD:   Vikki Graves

## 2024-04-29 ENCOUNTER — Other Ambulatory Visit: Payer: Self-pay | Admitting: Family

## 2024-05-19 ENCOUNTER — Other Ambulatory Visit (HOSPITAL_BASED_OUTPATIENT_CLINIC_OR_DEPARTMENT_OTHER): Payer: Self-pay

## 2024-05-19 DIAGNOSIS — E782 Mixed hyperlipidemia: Secondary | ICD-10-CM

## 2024-05-19 MED ORDER — PRAVASTATIN SODIUM 40 MG PO TABS: 40.0000 mg | ORAL_TABLET | Freq: Every evening | ORAL | 2 refills | Status: DC

## 2024-05-19 NOTE — Telephone Encounter (Signed)
 Incoming fax from pharmacy requesting refill for Pravastatin  40 MG.

## 2024-06-19 DIAGNOSIS — E1140 Type 2 diabetes mellitus with diabetic neuropathy, unspecified: Secondary | ICD-10-CM | POA: Diagnosis not present

## 2024-06-19 DIAGNOSIS — F319 Bipolar disorder, unspecified: Secondary | ICD-10-CM | POA: Diagnosis not present

## 2024-06-19 DIAGNOSIS — I480 Paroxysmal atrial fibrillation: Secondary | ICD-10-CM | POA: Diagnosis not present

## 2024-06-19 DIAGNOSIS — K219 Gastro-esophageal reflux disease without esophagitis: Secondary | ICD-10-CM | POA: Diagnosis not present

## 2024-06-19 DIAGNOSIS — Z79899 Other long term (current) drug therapy: Secondary | ICD-10-CM | POA: Diagnosis not present

## 2024-06-19 DIAGNOSIS — N400 Enlarged prostate without lower urinary tract symptoms: Secondary | ICD-10-CM | POA: Diagnosis not present

## 2024-06-19 DIAGNOSIS — Z0000 Encounter for general adult medical examination without abnormal findings: Secondary | ICD-10-CM | POA: Diagnosis not present

## 2024-06-19 DIAGNOSIS — F0390 Unspecified dementia without behavioral disturbance: Secondary | ICD-10-CM | POA: Diagnosis not present

## 2024-06-19 DIAGNOSIS — I10 Essential (primary) hypertension: Secondary | ICD-10-CM | POA: Diagnosis not present

## 2024-06-19 DIAGNOSIS — E782 Mixed hyperlipidemia: Secondary | ICD-10-CM | POA: Diagnosis not present

## 2024-06-19 DIAGNOSIS — E1151 Type 2 diabetes mellitus with diabetic peripheral angiopathy without gangrene: Secondary | ICD-10-CM | POA: Diagnosis not present

## 2024-06-19 DIAGNOSIS — Z1331 Encounter for screening for depression: Secondary | ICD-10-CM | POA: Diagnosis not present

## 2024-06-20 DIAGNOSIS — R0989 Other specified symptoms and signs involving the circulatory and respiratory systems: Secondary | ICD-10-CM | POA: Diagnosis not present

## 2024-06-21 DIAGNOSIS — J449 Chronic obstructive pulmonary disease, unspecified: Secondary | ICD-10-CM | POA: Diagnosis not present

## 2024-06-21 DIAGNOSIS — I110 Hypertensive heart disease with heart failure: Secondary | ICD-10-CM | POA: Diagnosis not present

## 2024-06-21 DIAGNOSIS — I480 Paroxysmal atrial fibrillation: Secondary | ICD-10-CM | POA: Diagnosis not present

## 2024-06-21 DIAGNOSIS — E1140 Type 2 diabetes mellitus with diabetic neuropathy, unspecified: Secondary | ICD-10-CM | POA: Diagnosis not present

## 2024-06-29 ENCOUNTER — Other Ambulatory Visit: Payer: Self-pay | Admitting: Internal Medicine

## 2024-06-29 DIAGNOSIS — R0989 Other specified symptoms and signs involving the circulatory and respiratory systems: Secondary | ICD-10-CM

## 2024-06-29 DIAGNOSIS — R918 Other nonspecific abnormal finding of lung field: Secondary | ICD-10-CM

## 2024-07-04 ENCOUNTER — Ambulatory Visit
Admission: RE | Admit: 2024-07-04 | Discharge: 2024-07-04 | Disposition: A | Discharge status: Home / Self Care | Source: Ambulatory Visit | Attending: Internal Medicine | Admitting: Internal Medicine

## 2024-07-04 DIAGNOSIS — I700 Atherosclerosis of aorta: Secondary | ICD-10-CM | POA: Diagnosis not present

## 2024-07-04 DIAGNOSIS — I2510 Atherosclerotic heart disease of native coronary artery without angina pectoris: Secondary | ICD-10-CM | POA: Diagnosis not present

## 2024-07-04 DIAGNOSIS — J439 Emphysema, unspecified: Secondary | ICD-10-CM | POA: Diagnosis not present

## 2024-07-04 DIAGNOSIS — R918 Other nonspecific abnormal finding of lung field: Secondary | ICD-10-CM

## 2024-07-04 DIAGNOSIS — R0989 Other specified symptoms and signs involving the circulatory and respiratory systems: Secondary | ICD-10-CM

## 2024-07-08 ENCOUNTER — Encounter (HOSPITAL_BASED_OUTPATIENT_CLINIC_OR_DEPARTMENT_OTHER): Payer: Self-pay

## 2024-07-08 ENCOUNTER — Emergency Department (HOSPITAL_BASED_OUTPATIENT_CLINIC_OR_DEPARTMENT_OTHER)

## 2024-07-08 ENCOUNTER — Other Ambulatory Visit: Payer: Self-pay

## 2024-07-08 ENCOUNTER — Emergency Department (HOSPITAL_BASED_OUTPATIENT_CLINIC_OR_DEPARTMENT_OTHER)
Admission: EM | Admit: 2024-07-08 | Discharge: 2024-07-08 | Disposition: A | Discharge status: Home / Self Care | Source: Ambulatory Visit

## 2024-07-08 DIAGNOSIS — Z79899 Other long term (current) drug therapy: Secondary | ICD-10-CM | POA: Insufficient documentation

## 2024-07-08 DIAGNOSIS — K625 Hemorrhage of anus and rectum: Secondary | ICD-10-CM | POA: Diagnosis not present

## 2024-07-08 DIAGNOSIS — R197 Diarrhea, unspecified: Secondary | ICD-10-CM | POA: Insufficient documentation

## 2024-07-08 DIAGNOSIS — R109 Unspecified abdominal pain: Secondary | ICD-10-CM | POA: Diagnosis not present

## 2024-07-08 DIAGNOSIS — K5730 Diverticulosis of large intestine without perforation or abscess without bleeding: Secondary | ICD-10-CM | POA: Diagnosis not present

## 2024-07-08 DIAGNOSIS — R1012 Left upper quadrant pain: Secondary | ICD-10-CM | POA: Diagnosis not present

## 2024-07-08 DIAGNOSIS — R1011 Right upper quadrant pain: Secondary | ICD-10-CM | POA: Diagnosis not present

## 2024-07-08 DIAGNOSIS — R1031 Right lower quadrant pain: Secondary | ICD-10-CM | POA: Diagnosis not present

## 2024-07-08 DIAGNOSIS — I719 Aortic aneurysm of unspecified site, without rupture: Secondary | ICD-10-CM | POA: Diagnosis not present

## 2024-07-08 DIAGNOSIS — R1032 Left lower quadrant pain: Secondary | ICD-10-CM | POA: Diagnosis not present

## 2024-07-08 DIAGNOSIS — Z7901 Long term (current) use of anticoagulants: Secondary | ICD-10-CM | POA: Insufficient documentation

## 2024-07-08 LAB — CBC
HCT: 45.3 % (ref 39.0–52.0)
Hemoglobin: 14.9 g/dL (ref 13.0–17.0)
MCH: 31 pg (ref 26.0–34.0)
MCHC: 32.9 g/dL (ref 30.0–36.0)
MCV: 94.2 fL (ref 80.0–100.0)
Platelets: 201 K/uL (ref 150–400)
RBC: 4.81 MIL/uL (ref 4.22–5.81)
RDW: 13 % (ref 11.5–15.5)
WBC: 6.5 K/uL (ref 4.0–10.5)
nRBC: 0 % (ref 0.0–0.2)

## 2024-07-08 LAB — COMPREHENSIVE METABOLIC PANEL WITH GFR
ALT: 10 U/L (ref 0–44)
AST: 18 U/L (ref 15–41)
Albumin: 4 g/dL (ref 3.5–5.0)
Alkaline Phosphatase: 75 U/L (ref 38–126)
Anion gap: 12 (ref 5–15)
BUN: 19 mg/dL (ref 8–23)
CO2: 22 mmol/L (ref 22–32)
Calcium: 9.4 mg/dL (ref 8.9–10.3)
Chloride: 103 mmol/L (ref 98–111)
Creatinine, Ser: 1.31 mg/dL — ABNORMAL HIGH (ref 0.61–1.24)
GFR, Estimated: 55 mL/min — ABNORMAL LOW (ref >60)
Glucose, Bld: 125 mg/dL — ABNORMAL HIGH (ref 70–99)
Potassium: 4.2 mmol/L (ref 3.5–5.1)
Sodium: 138 mmol/L (ref 135–145)
Total Bilirubin: 0.6 mg/dL (ref 0.0–1.2)
Total Protein: 7.2 g/dL (ref 6.5–8.1)

## 2024-07-08 LAB — C DIFFICILE QUICK SCREEN W PCR REFLEX
C Diff antigen: NEGATIVE
C Diff interpretation: NOT DETECTED
C Diff toxin: NEGATIVE

## 2024-07-08 MED ORDER — IOHEXOL 350 MG/ML SOLN
100.0000 mL | Freq: Once | INTRAVENOUS | Status: AC | PRN
  Administered 2024-07-08: 100 mL via INTRAVENOUS

## 2024-07-08 MED ORDER — LACTATED RINGERS IV BOLUS
1000.0000 mL | Freq: Once | INTRAVENOUS | Status: AC
  Administered 2024-07-08: 1000 mL via INTRAVENOUS

## 2024-07-08 NOTE — Discharge Instructions (Addendum)
 Continue your medicines as prescribed.  Follow-up with your doctor at your appointment next week.  Return to the ER for new or worsening symptoms.  If your C. difficile is positive somebody will call you with the results.

## 2024-07-08 NOTE — ED Provider Notes (Signed)
 [place] EMERGENCY DEPARTMENT AT [facility] Provider Note   CSN: [id number] Arrival date & time: 07/08/24  1140     Patient presents with: Hematemesis   Douglas Hurst. is a 83 y.o. male.   83 year old male presents for evaluation of blood in his stool.  Was sent from [facility] GI office.  Wife is at bedside and helps provide history.  She states that patient has had diarrhea for a few weeks and they first noticed some blood in it once last week and then again today.  Patient states he has been more fatigued but denies any shortness of breath.  Has not had any nausea or vomiting.  They tried Imodium and that has not helped with the diarrhea denies any other symptoms or concerns at this time.        Prior to Admission medications   Medication Sig Start Date End Date Taking? Authorizing Provider  ELIQUIS  5 MG TABS tablet TAKE 1 TABLET BY MOUTH TWICE A DAY 07/02/23  Yes Walker, Caitlin S, NP  acetaminophen  (TYLENOL ) 325 MG tablet Take 2 tablets (650 mg total) by mouth every 6 (six) hours as needed for mild pain (or Fever >/= 101). 04/30/22   Sherrill Cable Latif, DO  donepezil  (ARICEPT ) 10 MG tablet Take 1 tablet (10 mg total) by mouth at bedtime. 04/06/24   Whitfield Raisin, NP  finasteride (PROSCAR) 5 MG tablet Take 5 mg by mouth daily.    [provider]  furosemide  (LASIX ) 20 MG tablet Take 1 tablet (20 mg total) by mouth daily. 11/12/23   Lonni Slain, MD  Multiple Vitamin (MULTIVITAMIN WITH MINERALS) TABS tablet Take 1 tablet by mouth daily.    [provider]  polyethylene glycol powder (GLYCOLAX /MIRALAX ) 17 GM/SCOOP powder Take 17 g by mouth daily. Patient taking differently: Take 17 g by mouth daily as needed for mild constipation or moderate constipation. 04/30/22   Sherrill Cable Latif, DO  pravastatin  (PRAVACHOL ) 40 MG tablet Take 1 tablet (40 mg total) by mouth every evening. 05/19/24   Raford Riggs, MD  SPIRIVA  RESPIMAT 2.5 MCG/ACT AERS  Inhale 2 puffs into the lungs daily as needed (COPD). 06/04/22   [provider]  spironolactone  (ALDACTONE ) 25 MG tablet TAKE 1 TABLET (25 MG TOTAL) BY MOUTH DAILY. 03/09/24   Walker, Caitlin S, NP  tadalafil (CIALIS) 20 MG tablet Take 20 mg by mouth every three (3) days as needed for erectile dysfunction. 03/29/22   [provider]  tamsulosin  (FLOMAX ) 0.4 MG CAPS capsule Take 0.4 mg by mouth daily after supper. 05/13/22   [provider]    Allergies: Patient has no known allergies.    Review of Systems  Constitutional:  Positive for fatigue. Negative for chills and fever.  HENT:  Negative for ear pain and sore throat.   Eyes:  Negative for pain and visual disturbance.  Respiratory:  Negative for cough and shortness of breath.   Cardiovascular:  Negative for chest pain and palpitations.  Gastrointestinal:  Positive for abdominal pain and diarrhea. Negative for vomiting.  Genitourinary:  Negative for dysuria and hematuria.  Musculoskeletal:  Negative for arthralgias and back pain.  Skin:  Negative for color change and rash.  Neurological:  Negative for seizures and syncope.  All other systems reviewed and are negative.   Updated Vital Signs BP (!) 125/54   Pulse 77   Temp 97.9 F (36.6 C) (Oral)   Resp 14   Ht 6' 1 (1.854 m)  Wt 89.5 kg   SpO2 100%   BMI 26.04 kg/m   Physical Exam Vitals and nursing note reviewed.  Constitutional:      General: He is not in acute distress.    Appearance: Normal appearance. He is well-developed. He is not ill-appearing.  HENT:     Head: Normocephalic and atraumatic.  Eyes:     Conjunctiva/sclera: Conjunctivae normal.  Cardiovascular:     Rate and Rhythm: Normal rate and regular rhythm.     Heart sounds: No murmur heard. Pulmonary:     Effort: Pulmonary effort is normal. No respiratory distress.     Breath sounds: Normal breath sounds.  Abdominal:     General: There is no distension.     Palpations: Abdomen is  soft. There is no mass.     Tenderness: There is abdominal tenderness.     Hernia: No hernia is present.  Musculoskeletal:        General: No swelling.     Cervical back: Neck supple.  Skin:    General: Skin is warm and dry.     Capillary Refill: Capillary refill takes less than 2 seconds.  Neurological:     Mental Status: He is alert.  Psychiatric:        Mood and Affect: Mood normal.     (all labs ordered are listed, but only abnormal results are displayed) Labs Reviewed  COMPREHENSIVE METABOLIC PANEL WITH GFR - Abnormal; Notable for the following components:      Result Value   Glucose, Bld 125 (*)    Creatinine, Ser 1.31 (*)    GFR, Estimated 55 (*)    All other components within normal limits  C DIFFICILE QUICK SCREEN W PCR REFLEX    CBC  OCCULT BLOOD X 1 CARD TO LAB, STOOL    EKG: None  Radiology: CT Angio Abd/Pel W and/or Wo Contrast Result Date: 07/08/2024 CLINICAL DATA:  Rectal bleeding, concern for active bleeding. Diarrhea for 2-3 weeks with abdominal pain. EXAM: CTA ABDOMEN AND PELVIS WITHOUT AND WITH CONTRAST TECHNIQUE: Multidetector CT imaging of the abdomen and pelvis was performed using the standard protocol during bolus administration of intravenous contrast. Multiplanar reconstructed images and MIPs were obtained and reviewed to evaluate the vascular anatomy. RADIATION DOSE REDUCTION: This exam was performed according to the departmental dose-optimization program which includes automated exposure control, adjustment of the mA and/or kV according to patient size and/or use of iterative reconstruction technique. CONTRAST:  OMNIPAQUE  IOHEXOL  350 MG/ML SOLN COMPARISON:  09/16/2022. FINDINGS: VASCULAR Aorta: Aortic atherosclerosis with fusiform aneurysmal dilatation of the distal abdominal aorta measuring 4.1 x 4.1 cm. No dissection is seen. Celiac: Patent without evidence of aneurysm, dissection, vasculitis or significant stenosis. SMA: Atherosclerotic changes  with a small dissection flap in the mid SMA without evidence of significant stenosis. Renals: Both renal arteries are patent without evidence of aneurysm, dissection, vasculitis, fibromuscular dysplasia or significant stenosis. IMA: Patent without evidence of aneurysm, dissection, vasculitis or significant stenosis. Inflow: Aortic atherosclerosis with mild aneurysmal dilatation of the common iliac artery on the right measuring 1.9 cm. The internal and external iliac arteries are patent with mild stenosis. Proximal Outflow: Bilateral common femoral and visualized portions of the superficial and profunda femoral arteries are patent without evidence of aneurysm, dissection, vasculitis or significant stenosis. Veins: No obvious venous abnormality within the limitations of this arterial phase study. Review of the MIP images confirms the above findings. NON-VASCULAR Lower chest: Atelectasis is present at the lung bases. There  are trace bilateral pleural effusions. Hepatobiliary: No focal liver abnormality is seen. Status post cholecystectomy. No biliary dilatation. Pancreas: Unremarkable. No pancreatic ductal dilatation or surrounding inflammatory changes. Spleen: Normal in size without focal abnormality. Adrenals/Urinary Tract: The adrenal glands are within normal limits. The kidneys enhance symmetrically. There is a stable hypodensity in the mid right kidney, possible cyst. No renal calculus or hydronephrosis bilaterally. The bladder is within normal limits. Stomach/Bowel: There is thickening of the walls of the distal esophagus. A small hiatal hernia is noted. No bowel obstruction, free air, or pneumatosis is seen. There is mild rectal wall thickening. Scattered diverticula are present along the colon without evidence of diverticulitis. Appendix appears normal. No acute or active hemorrhage is seen. Lymphatic: No abdominal or pelvic lymphadenopathy. Reproductive: Prostate gland is enlarged. Other: No abdominopelvic  ascites. A fat containing umbilical hernia is present. Musculoskeletal: Degenerative changes are present in the thoracolumbar spine. No acute osseous abnormality is seen. IMPRESSION: VASCULAR 1. No evidence of active hemorrhage. 2. 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 Recommend follow-up CT or MR as appropriate in 12 months and referral to or continued care with vascular specialist. (Ref.: J Vasc Surg. 2018; 67:2-77 and J Am Coll Radiol 2013;10(10):789-794.) 3. 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 4. 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 NON-VASCULAR 1. Rectal wall thickening, possible proctitis. No acute or active hemorrhage is seen. 2. Diverticulosis without diverticulitis. 3. Thickening of the walls of the distal esophagus with small hiatal hernia. 4. Trace bilateral pleural effusions with atelectasis. 5. Enlarged prostate gland. Electronically Signed   By: Leita Birmingham M.D.   On: 07/08/2024 13:55     Procedures   Medications Ordered in the ED  lactated ringers  bolus 1,000 mL (0 mLs Intravenous Stopped 07/08/24 1405)  iohexol  (OMNIPAQUE ) 350 MG/ML injection 100 mL (100 mLs Intravenous Contrast Given 07/08/24 1258)                                    Medical Decision Making Medical Decision Making Nursing notes are reviewed. Differential diagnosis for this patient would include but not limited to: Lower GI bleed, diverticular bleed, diverticulitis, hemorrhoids, other  Cardiac monitor interpretation: Sinus rhythm, no ectopy  Emergency Department Course:  Vital signs and pulse oximetry are reviewed, evaluated by myself and found to be within normal limits prior to final disposition. Findings of laboratory testing and medical imaging are discussed with patient and family that is available. Patient agrees with the medical care plan as  follows:   Patient's lab workup reviewed by me and his hemoglobin is stable.  Was given some IV fluids.  Stool sent for Hemoccult as well as C. difficile per family request.  He was sent in by the GI office for further workup.  CT angiogram of the abdomen and pelvis is largely negative for acute abnormality other than some mild proctitis.  Patient is otherwise feeling well having no pain with stable vital signs.  Advise close follow-up with GI, they do an appointment this coming week.  They will receive a phone call if C. difficile is positive.  Advised them to check MyChart as well.  Advise return for new or worsening symptoms otherwise they feel comfortable to plan be discharged home.  Problems Addressed: Diarrhea, unspecified type: undiagnosed new problem  with uncertain prognosis Rectal bleeding: undiagnosed new problem with uncertain prognosis  Amount and/or Complexity of Data Reviewed Independent Historian: spouse    Details: Help to provide history.  States that patient has had some bloody stools and requested a C. difficile.  States that that is what GI says symptom and for. External Data Reviewed: notes.    Details: No GI office record to review from earlier today Labs: ordered. Decision-making details documented in ED Course.    Details: Ordered and reviewed by me and shows stable hemoglobin and no acute abnormality, no leukocytosis Radiology: ordered and independent interpretation performed. Decision-making details documented in ED Course.    Details: CT angiogram of the abdomen and pelvis was reviewed by me and shows some mild proctitis but no evidence of acute abnormality or active bleeding  Risk OTC drugs. Prescription drug management. Drug therapy requiring intensive monitoring for toxicity.    Final diagnoses:  Rectal bleeding  Diarrhea, unspecified type    ED Discharge Orders     None          Gennaro Duwaine CROME, DO 07/08/24 1513

## 2024-07-08 NOTE — ED Notes (Signed)
 He is returned from CT per stretcher at this time.

## 2024-07-08 NOTE — ED Triage Notes (Addendum)
 Sent by [facility] for further eval for diarrhea for 2-3 weeks. Found blood in stool 3 times: today, 1 day ago and 4 days ago. Noted bright red in color. Slight abd pain. Denies rectal pain.

## 2024-07-15 ENCOUNTER — Other Ambulatory Visit: Payer: Self-pay | Admitting: Adult Health

## 2024-07-19 DIAGNOSIS — I110 Hypertensive heart disease with heart failure: Secondary | ICD-10-CM | POA: Diagnosis not present

## 2024-07-19 DIAGNOSIS — E119 Type 2 diabetes mellitus without complications: Secondary | ICD-10-CM | POA: Diagnosis not present

## 2024-07-19 DIAGNOSIS — K921 Melena: Secondary | ICD-10-CM | POA: Diagnosis not present

## 2024-07-19 DIAGNOSIS — Z23 Encounter for immunization: Secondary | ICD-10-CM | POA: Diagnosis not present

## 2024-07-19 DIAGNOSIS — H43813 Vitreous degeneration, bilateral: Secondary | ICD-10-CM | POA: Diagnosis not present

## 2024-07-19 DIAGNOSIS — N400 Enlarged prostate without lower urinary tract symptoms: Secondary | ICD-10-CM | POA: Diagnosis not present

## 2024-07-19 DIAGNOSIS — Z961 Presence of intraocular lens: Secondary | ICD-10-CM | POA: Diagnosis not present

## 2024-07-19 DIAGNOSIS — I480 Paroxysmal atrial fibrillation: Secondary | ICD-10-CM | POA: Diagnosis not present

## 2024-07-19 DIAGNOSIS — R7989 Other specified abnormal findings of blood chemistry: Secondary | ICD-10-CM | POA: Diagnosis not present

## 2024-07-19 DIAGNOSIS — F0390 Unspecified dementia without behavioral disturbance: Secondary | ICD-10-CM | POA: Diagnosis not present

## 2024-07-19 DIAGNOSIS — R1319 Other dysphagia: Secondary | ICD-10-CM | POA: Diagnosis not present

## 2024-07-19 DIAGNOSIS — I10 Essential (primary) hypertension: Secondary | ICD-10-CM | POA: Diagnosis not present

## 2024-07-19 DIAGNOSIS — E1140 Type 2 diabetes mellitus with diabetic neuropathy, unspecified: Secondary | ICD-10-CM | POA: Diagnosis not present

## 2024-07-19 DIAGNOSIS — R54 Age-related physical debility: Secondary | ICD-10-CM | POA: Diagnosis not present

## 2024-07-21 DIAGNOSIS — R197 Diarrhea, unspecified: Secondary | ICD-10-CM | POA: Diagnosis not present

## 2024-07-21 DIAGNOSIS — K921 Melena: Secondary | ICD-10-CM | POA: Diagnosis not present

## 2024-07-25 ENCOUNTER — Other Ambulatory Visit (HOSPITAL_BASED_OUTPATIENT_CLINIC_OR_DEPARTMENT_OTHER): Payer: Self-pay | Admitting: Family

## 2024-07-25 NOTE — Telephone Encounter (Signed)
 Prescription refill request for Eliquis  received. Indication:afib Last office visit:11/24 Scr:1.31  7/25 Age: 83 Weight:89.5  kg  Prescription refilled

## 2024-07-27 DIAGNOSIS — E782 Mixed hyperlipidemia: Secondary | ICD-10-CM | POA: Diagnosis not present

## 2024-07-27 DIAGNOSIS — F0390 Unspecified dementia without behavioral disturbance: Secondary | ICD-10-CM | POA: Diagnosis not present

## 2024-07-27 DIAGNOSIS — F319 Bipolar disorder, unspecified: Secondary | ICD-10-CM | POA: Diagnosis not present

## 2024-07-27 DIAGNOSIS — N400 Enlarged prostate without lower urinary tract symptoms: Secondary | ICD-10-CM | POA: Diagnosis not present

## 2024-07-31 ENCOUNTER — Telehealth: Payer: Self-pay

## 2024-07-31 DIAGNOSIS — J449 Chronic obstructive pulmonary disease, unspecified: Secondary | ICD-10-CM

## 2024-08-03 DIAGNOSIS — R1310 Dysphagia, unspecified: Secondary | ICD-10-CM | POA: Diagnosis not present

## 2024-08-03 DIAGNOSIS — I480 Paroxysmal atrial fibrillation: Secondary | ICD-10-CM | POA: Diagnosis not present

## 2024-08-03 DIAGNOSIS — I509 Heart failure, unspecified: Secondary | ICD-10-CM | POA: Diagnosis not present

## 2024-08-03 DIAGNOSIS — R109 Unspecified abdominal pain: Secondary | ICD-10-CM | POA: Diagnosis not present

## 2024-08-03 DIAGNOSIS — R935 Abnormal findings on diagnostic imaging of other abdominal regions, including retroperitoneum: Secondary | ICD-10-CM | POA: Diagnosis not present

## 2024-08-03 DIAGNOSIS — K921 Melena: Secondary | ICD-10-CM | POA: Diagnosis not present

## 2024-08-04 ENCOUNTER — Other Ambulatory Visit (HOSPITAL_COMMUNITY): Payer: Self-pay | Admitting: *Deleted

## 2024-08-04 DIAGNOSIS — R1310 Dysphagia, unspecified: Secondary | ICD-10-CM

## 2024-08-08 ENCOUNTER — Observation Stay (HOSPITAL_COMMUNITY)
Admission: EM | Admit: 2024-08-08 | Discharge: 2024-08-11 | Disposition: A | Discharge status: Home / Self Care | Attending: Emergency Medicine | Admitting: Emergency Medicine

## 2024-08-08 ENCOUNTER — Other Ambulatory Visit: Payer: Self-pay

## 2024-08-08 ENCOUNTER — Encounter (HOSPITAL_COMMUNITY): Payer: Self-pay

## 2024-08-08 DIAGNOSIS — K224 Dyskinesia of esophagus: Secondary | ICD-10-CM | POA: Insufficient documentation

## 2024-08-08 DIAGNOSIS — F319 Bipolar disorder, unspecified: Secondary | ICD-10-CM | POA: Diagnosis present

## 2024-08-08 DIAGNOSIS — K922 Gastrointestinal hemorrhage, unspecified: Principal | ICD-10-CM | POA: Diagnosis present

## 2024-08-08 DIAGNOSIS — K648 Other hemorrhoids: Secondary | ICD-10-CM | POA: Insufficient documentation

## 2024-08-08 DIAGNOSIS — K449 Diaphragmatic hernia without obstruction or gangrene: Secondary | ICD-10-CM | POA: Diagnosis not present

## 2024-08-08 DIAGNOSIS — E1165 Type 2 diabetes mellitus with hyperglycemia: Secondary | ICD-10-CM | POA: Insufficient documentation

## 2024-08-08 DIAGNOSIS — K921 Melena: Secondary | ICD-10-CM | POA: Diagnosis not present

## 2024-08-08 DIAGNOSIS — E782 Mixed hyperlipidemia: Secondary | ICD-10-CM | POA: Diagnosis present

## 2024-08-08 DIAGNOSIS — I110 Hypertensive heart disease with heart failure: Secondary | ICD-10-CM | POA: Insufficient documentation

## 2024-08-08 DIAGNOSIS — R197 Diarrhea, unspecified: Secondary | ICD-10-CM

## 2024-08-08 DIAGNOSIS — J449 Chronic obstructive pulmonary disease, unspecified: Secondary | ICD-10-CM | POA: Diagnosis present

## 2024-08-08 DIAGNOSIS — I509 Heart failure, unspecified: Secondary | ICD-10-CM | POA: Diagnosis not present

## 2024-08-08 DIAGNOSIS — Z8249 Family history of ischemic heart disease and other diseases of the circulatory system: Secondary | ICD-10-CM | POA: Insufficient documentation

## 2024-08-08 DIAGNOSIS — K5710 Diverticulosis of small intestine without perforation or abscess without bleeding: Secondary | ICD-10-CM | POA: Diagnosis not present

## 2024-08-08 DIAGNOSIS — Z79899 Other long term (current) drug therapy: Secondary | ICD-10-CM | POA: Insufficient documentation

## 2024-08-08 DIAGNOSIS — K219 Gastro-esophageal reflux disease without esophagitis: Secondary | ICD-10-CM | POA: Diagnosis present

## 2024-08-08 DIAGNOSIS — I714 Abdominal aortic aneurysm, without rupture, unspecified: Secondary | ICD-10-CM | POA: Diagnosis not present

## 2024-08-08 DIAGNOSIS — I480 Paroxysmal atrial fibrillation: Secondary | ICD-10-CM | POA: Diagnosis present

## 2024-08-08 DIAGNOSIS — Z7901 Long term (current) use of anticoagulants: Secondary | ICD-10-CM | POA: Diagnosis not present

## 2024-08-08 DIAGNOSIS — E1142 Type 2 diabetes mellitus with diabetic polyneuropathy: Secondary | ICD-10-CM | POA: Diagnosis present

## 2024-08-08 DIAGNOSIS — Z87891 Personal history of nicotine dependence: Secondary | ICD-10-CM | POA: Diagnosis not present

## 2024-08-08 DIAGNOSIS — R1310 Dysphagia, unspecified: Secondary | ICD-10-CM | POA: Insufficient documentation

## 2024-08-08 DIAGNOSIS — I10 Essential (primary) hypertension: Secondary | ICD-10-CM | POA: Diagnosis not present

## 2024-08-08 DIAGNOSIS — K2951 Unspecified chronic gastritis with bleeding: Secondary | ICD-10-CM | POA: Diagnosis not present

## 2024-08-08 DIAGNOSIS — K51011 Ulcerative (chronic) pancolitis with rectal bleeding: Secondary | ICD-10-CM | POA: Insufficient documentation

## 2024-08-08 LAB — CBC
HCT: 43.2 % (ref 39.0–52.0)
HCT: 42.1 % (ref 39.0–52.0)
HCT: 38.3 % — ABNORMAL LOW (ref 39.0–52.0)
Hemoglobin: 14.1 g/dL (ref 13.0–17.0)
Hemoglobin: 13.7 g/dL (ref 13.0–17.0)
Hemoglobin: 12.6 g/dL — ABNORMAL LOW (ref 13.0–17.0)
MCH: 30.4 pg (ref 26.0–34.0)
MCH: 30.6 pg (ref 26.0–34.0)
MCH: 30.2 pg (ref 26.0–34.0)
MCHC: 32.6 g/dL (ref 30.0–36.0)
MCHC: 32.5 g/dL (ref 30.0–36.0)
MCHC: 32.9 g/dL (ref 30.0–36.0)
MCV: 93.1 fL (ref 80.0–100.0)
MCV: 94 fL (ref 80.0–100.0)
MCV: 91.8 fL (ref 80.0–100.0)
Platelets: 270 K/uL (ref 150–400)
Platelets: 246 K/uL (ref 150–400)
Platelets: 258 K/uL (ref 150–400)
RBC: 4.64 MIL/uL (ref 4.22–5.81)
RBC: 4.48 MIL/uL (ref 4.22–5.81)
RBC: 4.17 MIL/uL — ABNORMAL LOW (ref 4.22–5.81)
RDW: 13.4 % (ref 11.5–15.5)
RDW: 13.6 % (ref 11.5–15.5)
RDW: 13.2 % (ref 11.5–15.5)
WBC: 7.6 K/uL (ref 4.0–10.5)
WBC: 6.7 K/uL (ref 4.0–10.5)
WBC: 7.6 K/uL (ref 4.0–10.5)
nRBC: 0 % (ref 0.0–0.2)
nRBC: 0 % (ref 0.0–0.2)
nRBC: 0 % (ref 0.0–0.2)

## 2024-08-08 LAB — COMPREHENSIVE METABOLIC PANEL WITH GFR
ALT: 14 U/L (ref 0–44)
AST: 19 U/L (ref 15–41)
Albumin: 3.2 g/dL — ABNORMAL LOW (ref 3.5–5.0)
Alkaline Phosphatase: 66 U/L (ref 38–126)
Anion gap: 12 (ref 5–15)
BUN: 15 mg/dL (ref 8–23)
CO2: 23 mmol/L (ref 22–32)
Calcium: 9.2 mg/dL (ref 8.9–10.3)
Chloride: 102 mmol/L (ref 98–111)
Creatinine, Ser: 1.29 mg/dL — ABNORMAL HIGH (ref 0.61–1.24)
GFR, Estimated: 56 mL/min — ABNORMAL LOW (ref >60)
Glucose, Bld: 133 mg/dL — ABNORMAL HIGH (ref 70–99)
Potassium: 3.9 mmol/L (ref 3.5–5.1)
Sodium: 137 mmol/L (ref 135–145)
Total Bilirubin: 1.1 mg/dL (ref 0.0–1.2)
Total Protein: 6.8 g/dL (ref 6.5–8.1)

## 2024-08-08 LAB — TYPE AND SCREEN
ABO/RH(D): B POS
Antibody Screen: NEGATIVE

## 2024-08-08 LAB — PROTIME-INR
INR: 1.4 — ABNORMAL HIGH (ref 0.8–1.2)
Prothrombin Time: 17.9 s — ABNORMAL HIGH (ref 11.4–15.2)

## 2024-08-08 LAB — GLUCOSE, CAPILLARY
Glucose-Capillary: 99 mg/dL (ref 70–99)
Glucose-Capillary: 191 mg/dL — ABNORMAL HIGH (ref 70–99)

## 2024-08-08 LAB — POC OCCULT BLOOD, ED: Fecal Occult Bld: POSITIVE — AB

## 2024-08-08 LAB — CBG MONITORING, ED: Glucose-Capillary: 106 mg/dL — ABNORMAL HIGH (ref 70–99)

## 2024-08-08 LAB — TSH: TSH: 0.544 u[IU]/mL (ref 0.350–4.500)

## 2024-08-08 LAB — C-REACTIVE PROTEIN: CRP: 1.2 mg/dL — ABNORMAL HIGH (ref <1.0)

## 2024-08-08 LAB — SEDIMENTATION RATE: Sed Rate: 34 mm/h — ABNORMAL HIGH (ref 0–16)

## 2024-08-08 LAB — ABO/RH: ABO/RH(D): B POS

## 2024-08-08 MED ORDER — ONDANSETRON HCL 4 MG/2ML IJ SOLN: 4.0000 mg | Freq: Four times a day (QID) | INTRAMUSCULAR | Status: DC | PRN

## 2024-08-08 MED ORDER — SPIRONOLACTONE 12.5 MG HALF TABLET
12.5000 mg | ORAL_TABLET | Freq: Every day | ORAL | Status: DC
Start: 2024-08-09 — End: 2024-08-11
  Administered 2024-08-09 – 2024-08-11 (×3): 12.5 mg via ORAL
  Filled 2024-08-08 (×3): qty 1

## 2024-08-08 MED ORDER — ONDANSETRON HCL 4 MG PO TABS
4.0000 mg | ORAL_TABLET | Freq: Four times a day (QID) | ORAL | Status: DC | PRN
Start: 2024-08-08 — End: 2024-08-11

## 2024-08-08 MED ORDER — PANTOPRAZOLE SODIUM 40 MG IV SOLR
40.0000 mg | INTRAVENOUS | Status: DC
  Administered 2024-08-08 – 2024-08-10 (×5): 40 mg via INTRAVENOUS
  Filled 2024-08-08 (×3): qty 10

## 2024-08-08 MED ORDER — TAMSULOSIN HCL 0.4 MG PO CAPS
0.4000 mg | ORAL_CAPSULE | Freq: Every day | ORAL | Status: DC
  Administered 2024-08-09 – 2024-08-10 (×3): 0.4 mg via ORAL
  Filled 2024-08-08 (×2): qty 1

## 2024-08-08 MED ORDER — ACETAMINOPHEN 325 MG PO TABS: 650.0000 mg | ORAL_TABLET | Freq: Four times a day (QID) | ORAL | Status: DC | PRN

## 2024-08-08 MED ORDER — ADULT MULTIVITAMIN W/MINERALS CH
1.0000 | ORAL_TABLET | Freq: Every day | ORAL | Status: DC
  Administered 2024-08-09 – 2024-08-11 (×3): 1 via ORAL
  Filled 2024-08-08 (×3): qty 1

## 2024-08-08 MED ORDER — PRAVASTATIN SODIUM 40 MG PO TABS
40.0000 mg | ORAL_TABLET | Freq: Every evening | ORAL | Status: DC
  Administered 2024-08-08 – 2024-08-10 (×5): 40 mg via ORAL
  Filled 2024-08-08 (×3): qty 1

## 2024-08-08 MED ORDER — TIOTROPIUM BROMIDE MONOHYDRATE 2.5 MCG/ACT IN AERS: 2.0000 | INHALATION_SPRAY | Freq: Every day | RESPIRATORY_TRACT | Status: DC | PRN

## 2024-08-08 MED ORDER — IPRATROPIUM BROMIDE 0.02 % IN SOLN: 0.5000 mg | Freq: Every day | RESPIRATORY_TRACT | Status: DC | PRN

## 2024-08-08 MED ORDER — DONEPEZIL HCL 5 MG PO TABS
5.0000 mg | ORAL_TABLET | Freq: Every day | ORAL | Status: DC
  Administered 2024-08-08 – 2024-08-10 (×5): 5 mg via ORAL
  Filled 2024-08-08 (×3): qty 1

## 2024-08-08 MED ORDER — INSULIN ASPART 100 UNIT/ML IJ SOLN
0.0000 [IU] | Freq: Three times a day (TID) | INTRAMUSCULAR | Status: DC
  Administered 2024-08-10: 3 [IU] via SUBCUTANEOUS
  Administered 2024-08-11 (×2): 2 [IU] via SUBCUTANEOUS

## 2024-08-08 MED ORDER — FUROSEMIDE 20 MG PO TABS
20.0000 mg | ORAL_TABLET | ORAL | Status: DC
  Administered 2024-08-09 – 2024-08-11 (×3): 20 mg via ORAL
  Filled 2024-08-08 (×2): qty 1

## 2024-08-08 MED ORDER — ACETAMINOPHEN 650 MG RE SUPP: 650.0000 mg | Freq: Four times a day (QID) | RECTAL | Status: DC | PRN

## 2024-08-08 MED ORDER — FINASTERIDE 5 MG PO TABS
5.0000 mg | ORAL_TABLET | Freq: Every day | ORAL | Status: DC
  Administered 2024-08-09 – 2024-08-11 (×3): 5 mg via ORAL
  Filled 2024-08-08 (×3): qty 1

## 2024-08-08 NOTE — ED Triage Notes (Signed)
 Per EMS:  Bright red blood in stools Ongoing 6 weeks Takes a blood thinner eliquis  Diarrhea  3 times today Weakness ongoing

## 2024-08-08 NOTE — Assessment & Plan Note (Signed)
 Followed by vascular surgery with regular monitoring

## 2024-08-08 NOTE — H&P (Signed)
 History and Physical    Patient: Douglas Hurst. FMW:[id number] DOB: 09-03-1941 DOA: 08/08/2024 DOS: the patient was seen and examined on 08/08/2024 PCP: Charlott Dorn LABOR, MD  Patient coming from: Home - lives with his wife. Uses walker and cane for ambulation.    Chief Complaint: blood in stool   HPI: Douglas Hurst. is a 83 y.o. male with medical history significant of T2DM, COPD, GERD, HTN, HLD, atrial fibrillation on eliquis , bipolar who presented to ED with complaints of bright red blood in his stool that has been going on for a month. He has it every time he has a bowel movement. He states when it started it would be a puddle of bright red blood with diarrhea. Now, its just more streaks of blood in his stool and all over paper when he wipes. He doesn't really have any abdominal pain, no N/V. They went to ED for this on 7/12 at [facility]. C.diff was negative and followed up with his primary care physician. He continues to have diarrhea. He had stool studies completed by PCP which were negative. pancreatic elastase negative.  GI pathogen panel negative.  FOBT positive. He denies any light headedness or dizziness, no shortness of breath does state he feels weak. On Sunday night he had 3 BM with blood and was weak so GI suggested he come to ED.   Seen 8/7 by his physician where colonoscopy/EGD was planned, but never scheduled. Discussed clearance for this with hx of heart failure and holding eliquis , but wife states this was never scheduled. Interestingly, his echo in 08/2203 showed normal EF with only grade 1 DD.   He states previous colonoscopy was years ago. And was told it was his last one due to his age. No FH of colon cancer. Never had anything like this before.   Last took his eliquis  around 9:00 AM.    He has been feeling good. Denies any fever/chills, vision changes/headaches, chest pain or palpitations, shortness of breath or cough, abdominal pain, N/V dysuria or leg swelling.     He does not smoke or drink alcohol.   ER Course:  vitals: afebrile, bp: 134/72, HR: 85, RR: 21, oxygen: 97%RA Pertinent labs: creatinine: 1.29, fecal occult: positive CTA abdomen/pelvis: no evidence of active hemorrhage. 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 Recommend follow-up CT or MR as appropriate in 12 months and referral to or continued care with vascular specialist. (Ref.: J Vasc Surg. 2018; 67:2-77 and J Am Coll Radiol 2013;10(10):789-794.) 3. 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 4. 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  NON-VASCULAR   1. Rectal wall thickening, possible proctitis. No acute or active hemorrhage is seen. 2. Diverticulosis without diverticulitis. 3. Thickening of the walls of the distal esophagus with small hiatal hernia. 4. Trace bilateral pleural effusions with atelectasis. 5. Enlarged prostate gland. In ED: GI consulted. TRH asked to admit.     Review of Systems: As mentioned in the history of present illness. All other systems reviewed and are negative. Past Medical History:  Diagnosis Date   Bipolar disorder (HCC)    Chronic back pain    Complication of anesthesia    prolonged sedation and confusion   COPD (chronic obstructive pulmonary disease) (HCC)    Dr. signa   Diabetes mellitus without complication (HCC)    type 2   Diverticulosis  Dysrhythmia    Afib  onset 03-2022   ED (erectile dysfunction)    Gallstones    GERD (gastroesophageal reflux disease)    Hiatal hernia    Hyperlipidemia    Hypertension    Weakness 03/29/2023   Wears dentures    upper   Wears glasses    Wears hearing aid in both ears    Past Surgical History:  Procedure Laterality Date   BALLOON DILATION N/A 10/01/2015   Procedure: BALLOON DILATION;  Surgeon: Gladis MARLA Louder, MD;  Location: [facility]  ENDOSCOPY;  Service: Endoscopy;  Laterality: N/A;   CATARACT EXTRACTION, BILATERAL     CHOLECYSTECTOMY N/A 07/08/2022   Procedure: LAPAROSCOPIC CHOLECYSTECTOMY WITH INTRAOPERATIVE CHOLANGIOGRAM AND LAPAROSCOPIC COMMON DUCT EXPLORATION;  Surgeon: Lyndel Deward PARAS, MD;  Location: [facility] ORS;  Service: General;  Laterality: N/A;   COLONOSCOPY     removed polyps   COLONOSCOPY WITH PROPOFOL  N/A 09/01/2016   Procedure: COLONOSCOPY WITH PROPOFOL ;  Surgeon: Gladis MARLA Louder, MD;  Location: [facility];  Service: Endoscopy;  Laterality: N/A;   ERCP N/A 07/09/2022   Procedure: ENDOSCOPIC RETROGRADE CHOLANGIOPANCREATOGRAPHY (ERCP);  Surgeon: Rosalie Kitchens, MD;  Location: [facility];  Service: Gastroenterology;  Laterality: N/A;   ESOPHAGEAL DILATION  06/23/2022   Procedure: PYLORIC  DILATION;  Surgeon: Rosalie Kitchens, MD;  Location: [facility];  Service: Gastroenterology;;   ESOPHAGOGASTRODUODENOSCOPY N/A 06/23/2022   Procedure: ESOPHAGOGASTRODUODENOSCOPY (EGD);  Surgeon: Rosalie Kitchens, MD;  Location: [facility];  Service: Gastroenterology;  Laterality: N/A;   ESOPHAGOGASTRODUODENOSCOPY (EGD) WITH PROPOFOL  N/A 10/01/2015   Procedure: ESOPHAGOGASTRODUODENOSCOPY (EGD) WITH PROPOFOL ;  Surgeon: Gladis MARLA Louder, MD;  Location: [facility];  Service: Endoscopy;  Laterality: N/A;   FISTULOTOMY  07/09/2022   Procedure: FISTULOTOMY;  Surgeon: Rosalie Kitchens, MD;  Location: [facility];  Service: Gastroenterology;;   HEMOSTASIS CLIP PLACEMENT  06/23/2022   Procedure: HEMOSTASIS CLIP PLACEMENT;  Surgeon: Rosalie Kitchens, MD;  Location: [facility];  Service: Gastroenterology;;   IR CATHETER TUBE CHANGE  08/20/2022   IR EXCHANGE BILIARY DRAIN  05/15/2022   IR EXCHANGE BILIARY DRAIN  05/26/2022   IR EXCHANGE BILIARY DRAIN  06/11/2022   IR EXCHANGE BILIARY DRAIN  06/23/2022   IR PERC CHOLECYSTOSTOMY  04/26/2022   IR RADIOLOGIST EVAL & MGMT  09/16/2022   KNEE ARTHROSCOPY     TONSILLECTOMY     Social History:  reports that he has  quit smoking. His smoking use included cigarettes. He has never used smokeless tobacco. He reports that he does not drink alcohol and does not use drugs.  No Known Allergies  Family History  Problem Relation Age of Onset   Heart attack Mother    Cancer - Colon Mother    CAD Mother    Cancer Mother    Dementia Mother    CAD Father    Cancer Father    CVA Father    Dementia Father    Heart attack Brother    CAD Brother     Prior to Admission medications   Medication Sig Start Date End Date Taking? Authorizing Provider  acetaminophen  (TYLENOL ) 325 MG tablet Take 2 tablets (650 mg total) by mouth every 6 (six) hours as needed for mild pain (or Fever >/= 101). 04/30/22   Sheikh, Alejandro Latif, DO  donepezil  (ARICEPT ) 10 MG tablet TAKE 1 TABLET BY MOUTH EVERYDAY AT BEDTIME 07/18/24   Whitfield Raisin, NP  ELIQUIS  5 MG TABS tablet TAKE 1 TABLET BY MOUTH TWICE A DAY 07/25/24   Walker, Caitlin S,  NP  finasteride  (PROSCAR ) 5 MG tablet Take 5 mg by mouth daily.    [provider]  furosemide  (LASIX ) 20 MG tablet Take 1 tablet (20 mg total) by mouth daily. 11/12/23   Lonni Slain, MD  Multiple Vitamin (MULTIVITAMIN WITH MINERALS) TABS tablet Take 1 tablet by mouth daily.    [provider]  polyethylene glycol powder (GLYCOLAX /MIRALAX ) 17 GM/SCOOP powder Take 17 g by mouth daily. Patient taking differently: Take 17 g by mouth daily as needed for mild constipation or moderate constipation. 04/30/22   Sherrill Cable Latif, DO  pravastatin  (PRAVACHOL ) 40 MG tablet Take 1 tablet (40 mg total) by mouth every evening. 05/19/24   Raford Riggs, MD  SPIRIVA  RESPIMAT 2.5 MCG/ACT AERS Inhale 2 puffs into the lungs daily as needed (COPD). 06/04/22   [provider]  spironolactone  (ALDACTONE ) 25 MG tablet TAKE 1 TABLET (25 MG TOTAL) BY MOUTH DAILY. 03/09/24   Walker, Caitlin S, NP  tadalafil (CIALIS) 20 MG tablet Take 20 mg by mouth every three (3) days as needed for erectile  dysfunction. 03/29/22   [provider]  tamsulosin  (FLOMAX ) 0.4 MG CAPS capsule Take 0.4 mg by mouth daily after supper. 05/13/22   [provider]    Physical Exam: Vitals:   08/08/24 1759 08/08/24 1813 08/08/24 1816 08/08/24 1820  BP:    (!) 155/75  Pulse:    82  Resp:    14  Temp: 98 F (36.7 C) 97.8 F (36.6 C)  97.8 F (36.6 C)  TempSrc: Oral Oral  Oral  SpO2:    98%  Weight: 85.5 kg     Height:   6' 1 (1.854 m)    General:  Appears calm and comfortable and is in NAD. Pale appearing Eyes:  PERRL, EOMI, normal lids, iris ENT:  HOH, lips & tongue, mmm; appropriate dentition Neck:  no LAD, masses or thyromegaly; no carotid bruits Cardiovascular:  RRR, no m/r/g. No LE edema.  Respiratory:   CTA bilaterally with no wheezes/rales/rhonchi.  Normal respiratory effort. Abdomen:  soft, NT, ND, NABS Back:   normal alignment, no CVAT Skin:  no rash or induration seen on limited exam Musculoskeletal:  grossly normal tone BUE/BLE, good ROM, no bony abnormality Lower extremity:  No LE edema.  Limited foot exam with no ulcerations.  2+ distal pulses. Psychiatric:  grossly normal mood and affect, speech fluent and appropriate, AOx3 Neurologic:  CN 2-12 grossly intact, moves all extremities in coordinated fashion, sensation intact   Radiological Exams on Admission: Independently reviewed - see discussion in A/P where applicable  No results found.  EKG: Independently reviewed.  NSR with rate 89; nonspecific ST changes with no evidence of acute ischemia   Labs on Admission: I have personally reviewed the available labs and imaging studies at the time of the admission.  Pertinent labs:     Assessment and Plan: Principal Problem:   Lower GI bleed Active Problems:   Diarrhea   Paroxysmal atrial fibrillation (HCC)   Gastroesophageal reflux disease   Essential hypertension   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HCC)   Chronic  obstructive pulmonary disease (HCC)   Abdominal aortic aneurysm (HCC)   Bipolar disorder (HCC)   Mixed hyperlipidemia    Assessment and Plan: * Lower GI bleed 83 year old presenting to ED with 1 month history of diarrhea with bright red blood in stool who had 3 episodes of blood BM on Sunday with associated weakness/fatigue and came  to ED  -obs to progressive -hgb stable, continue to monitor q6 hours -CTA with no active bleed, possible proctitis  -type and screen -hold eliquis   -IV protonix  -full liquid diet -GI consulted, has been seen outpatient with them on 8/7 for same complaints and planned on EGD/colonoscopy.   Diarrhea One month history with associated bleeding Stool studies, c.diff and pancreatic elastase negative at end of July Check TSH, inflammatory markers and celiac panel   Paroxysmal atrial fibrillation (HCC) Currently in NSR, on tele  Last took eliquis  at 9AM Will hold this, discussed stroke risk while holding anti coagulation    Essential hypertension Continue spirinolactone and lasix  MWF   Gastroesophageal reflux disease IV PPI   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HCC) No recent A1C, pending  Lifestyle modifications  Sensitive SSI and accuchecks QAC/HS   Chronic obstructive pulmonary disease (HCC) On spiriva  PRN No s/sx of exacerbation   Abdominal aortic aneurysm (HCC) Followed by vascular surgery with regular monitoring   Bipolar disorder (HCC) Mood stable, on no medications   Mixed hyperlipidemia Continue pravachol      Advance Care Planning:   Code Status: Full Code  Consults: [facility]   DVT Prophylaxis: SCDs/hold eliquis    Family Communication: wife at bedside   Severity of Illness: The appropriate patient status for this patient is OBSERVATION. Observation status is judged to be reasonable and necessary in order to provide the required intensity of service to ensure the patient's safety. The  patient's presenting symptoms, physical exam findings, and initial radiographic and laboratory data in the context of their medical condition is felt to place them at decreased risk for further clinical deterioration. Furthermore, it is anticipated that the patient will be medically stable for discharge from the hospital within 2 midnights of admission.   Author: Isaiah Geralds, MD 08/08/2024 7:53 PM  For on call review [email or web address].

## 2024-08-08 NOTE — Assessment & Plan Note (Addendum)
 No recent A1C, pending  Lifestyle modifications  Sensitive SSI and accuchecks QAC/HS

## 2024-08-08 NOTE — Assessment & Plan Note (Signed)
 Mood stable, on no medications

## 2024-08-08 NOTE — ED Notes (Signed)
 Called and placed PT on monitor with CCMD

## 2024-08-08 NOTE — Assessment & Plan Note (Signed)
 On spiriva  PRN No s/sx of exacerbation

## 2024-08-08 NOTE — ED Notes (Signed)
 Nurse Assessment: Reports bright red bleeding in stool

## 2024-08-08 NOTE — ED Provider Notes (Addendum)
 [facility] EMERGENCY DEPARTMENT AT [facility] Provider Note   CSN: [id number] Arrival date & time: 08/08/24  1326     Patient presents with: No chief complaint on file.   Douglas Hurst. is a 83 y.o. male.   HPI    83 year old male with medical history significant for HTN, HLD, erectile dysfunction, diverticulosis, COPD, diabetes mellitus, paroxysmal atrial fibrillation on Eliquis  who presents to the emergency department with several weeks of bright red blood in his stools.  The patient held his Eliquis  for a couple of days and noticed a slight decrease but was told he had to resume it due to his history of atrial fibrillation.  He follows outpatient with [facility] gastroenterology.  He states that he has been having roughly 3 bloody loose stools a day.  He feels that symptoms have been ongoing intermittently for the past 6 weeks.  He endorses weakness, denies any chest pain, shortness of breath, denies any significant abdominal pain.  No fevers or chills. Prior to Admission medications   Medication Sig Start Date End Date Taking? Authorizing Provider  acetaminophen  (TYLENOL ) 325 MG tablet Take 2 tablets (650 mg total) by mouth every 6 (six) hours as needed for mild pain (or Fever >/= 101). 04/30/22   Sheikh, Alejandro Latif, DO  donepezil  (ARICEPT ) 10 MG tablet TAKE 1 TABLET BY MOUTH EVERYDAY AT BEDTIME 07/18/24   Whitfield Raisin, NP  ELIQUIS  5 MG TABS tablet TAKE 1 TABLET BY MOUTH TWICE A DAY 07/25/24   Walker, Caitlin S, NP  finasteride  (PROSCAR ) 5 MG tablet Take 5 mg by mouth daily.    [provider]  furosemide  (LASIX ) 20 MG tablet Take 1 tablet (20 mg total) by mouth daily. 11/12/23   Lonni Slain, MD  Multiple Vitamin (MULTIVITAMIN WITH MINERALS) TABS tablet Take 1 tablet by mouth daily.    [provider]  polyethylene glycol powder (GLYCOLAX /MIRALAX ) 17 GM/SCOOP powder Take 17 g by mouth daily. Patient taking differently: Take 17 g by mouth daily as  needed for mild constipation or moderate constipation. 04/30/22   Sherrill Alejandro Latif, DO  pravastatin  (PRAVACHOL ) 40 MG tablet Take 1 tablet (40 mg total) by mouth every evening. 05/19/24   Raford Riggs, MD  SPIRIVA  RESPIMAT 2.5 MCG/ACT AERS Inhale 2 puffs into the lungs daily as needed (COPD). 06/04/22   [provider]  spironolactone  (ALDACTONE ) 25 MG tablet TAKE 1 TABLET (25 MG TOTAL) BY MOUTH DAILY. 03/09/24   Walker, Caitlin S, NP  tadalafil (CIALIS) 20 MG tablet Take 20 mg by mouth every three (3) days as needed for erectile dysfunction. 03/29/22   [provider]  tamsulosin  (FLOMAX ) 0.4 MG CAPS capsule Take 0.4 mg by mouth daily after supper. 05/13/22   [provider]    Allergies: Patient has no known allergies.    Review of Systems  All other systems reviewed and are negative.   Updated Vital Signs BP 134/72   Pulse 85   Temp 97.7 F (36.5 C) (Oral)   Resp (!) 21   Ht 6' 1 (1.854 m)   Wt 93 kg   SpO2 97%   BMI 27.05 kg/m   Physical Exam Vitals and nursing note reviewed. Exam conducted with a chaperone present.  Constitutional:      General: He is not in acute distress. HENT:     Head: Normocephalic and atraumatic.  Eyes:     Conjunctiva/sclera: Conjunctivae normal.     Pupils: Pupils are equal, round, and  reactive to light.  Cardiovascular:     Rate and Rhythm: Normal rate and regular rhythm.  Pulmonary:     Effort: Pulmonary effort is normal. No respiratory distress.  Abdominal:     General: There is no distension.     Tenderness: There is no abdominal tenderness. There is no guarding.  Genitourinary:    Rectum: Guaiac result positive.     Comments: Fecal occult positive stool Musculoskeletal:        General: No deformity or signs of injury.     Cervical back: Neck supple.  Skin:    Findings: No lesion or rash.  Neurological:     General: No focal deficit present.     Mental Status: He is alert. Mental status is at baseline.      (all labs ordered are listed, but only abnormal results are displayed) Labs Reviewed  COMPREHENSIVE METABOLIC PANEL WITH GFR - Abnormal; Notable for the following components:      Result Value   Glucose, Bld 133 (*)    Creatinine, Ser 1.29 (*)    Albumin 3.2 (*)    GFR, Estimated 56 (*)    All other components within normal limits  POC OCCULT BLOOD, ED - Abnormal; Notable for the following components:   Fecal Occult Bld POSITIVE (*)    All other components within normal limits  CBC  TYPE AND SCREEN  ABO/RH    EKG: EKG Interpretation Date/Time:  Tuesday August 08 2024 13:39:56 EDT Ventricular Rate:  89 PR Interval:  143 QRS Duration:  90 QT Interval:  356 QTC Calculation: 434 R Axis:   53  Text Interpretation: Sinus rhythm Confirmed by Jerrol Agent ([id number]) on 08/08/2024 2:32:28 PM  Radiology: No results found.   Procedures   Medications Ordered in the ED - No data to display                                  Medical Decision Making Amount and/or Complexity of Data Reviewed Labs: ordered.  Risk Decision regarding hospitalization.     83 year old male with medical history significant for HTN, HLD, erectile dysfunction, diverticulosis, COPD, diabetes mellitus, paroxysmal atrial fibrillation on Eliquis  who presents to the emergency department with several weeks of bright red blood in his stools.  The patient held his Eliquis  for a couple of days and noticed a slight decrease but was told he had to resume it due to his history of atrial fibrillation.  He follows outpatient with [facility] gastroenterology.  He states that he has been having roughly 3 bloody loose stools a day.  He feels that symptoms have been ongoing intermittently for the past 6 weeks.  He endorses weakness, denies any chest pain, shortness of breath, denies any significant abdominal pain.  No fevers or chills.  On arrival, the patient was vitally stable, afebrile, nontachycardic, hemodynamically  stable.  Presenting with bright red blood in his stools in the setting of Eliquis  use.  Follows with [facility] gastroenterology.  IV access was obtained, labs obtained to include CBC with a normal hemoglobin.  Occult blood was positive.  Patient noted to have active bleeding by nursing staff bedside when his gown was changed.  Labs: Occult positive, CMP with creatinine of 1.29, otherwise generally unremarkable, CBC normal.  Dr. Forest of [facility] will see the patient in consultation, hospitalist medicine consulted for admission, Dr. Waddell accepting.     Final diagnoses:  Lower  GI bleed    ED Discharge Orders     None          Jerrol Agent, MD 08/08/24 1613    Jerrol Agent, MD 08/08/24 [phone]

## 2024-08-08 NOTE — ED Notes (Signed)
 Got patient into a gown on the monitor did EKG shown to er provider patient is resting with call bell in reach

## 2024-08-08 NOTE — Assessment & Plan Note (Addendum)
 83 year old presenting to ED with 1 month history of diarrhea with bright red blood in stool who had 3 episodes of blood BM on Sunday with associated weakness/fatigue and came to ED  -obs to progressive -hgb stable, continue to monitor q6 hours -CTA with no active bleed, possible proctitis  -type and screen -hold eliquis   -IV protonix  -full liquid diet -GI consulted, has been seen outpatient with them on 8/7 for same complaints and planned on EGD/colonoscopy.

## 2024-08-08 NOTE — Assessment & Plan Note (Signed)
 IV PPI

## 2024-08-08 NOTE — Assessment & Plan Note (Signed)
Continue pravachol.  

## 2024-08-08 NOTE — Assessment & Plan Note (Signed)
 One month history with associated bleeding Stool studies, c.diff and pancreatic elastase negative at end of July Check TSH, inflammatory markers and celiac panel

## 2024-08-08 NOTE — Assessment & Plan Note (Signed)
 Currently in NSR, on tele  Last took eliquis  at 9AM Will hold this, discussed stroke risk while holding anti coagulation

## 2024-08-08 NOTE — Assessment & Plan Note (Addendum)
 Continue spirinolactone and lasix  MWF

## 2024-08-09 DIAGNOSIS — K922 Gastrointestinal hemorrhage, unspecified: Secondary | ICD-10-CM | POA: Diagnosis not present

## 2024-08-09 DIAGNOSIS — K625 Hemorrhage of anus and rectum: Secondary | ICD-10-CM | POA: Diagnosis not present

## 2024-08-09 DIAGNOSIS — R195 Other fecal abnormalities: Secondary | ICD-10-CM | POA: Diagnosis not present

## 2024-08-09 DIAGNOSIS — R1310 Dysphagia, unspecified: Secondary | ICD-10-CM | POA: Diagnosis not present

## 2024-08-09 LAB — TISSUE TRANSGLUTAMINASE, IGA: Tissue Transglutaminase Ab, IgA: <2 U/mL (ref 0–3)

## 2024-08-09 LAB — CBC
HCT: 40.7 % (ref 39.0–52.0)
Hemoglobin: 13.7 g/dL (ref 13.0–17.0)
MCH: 30.5 pg (ref 26.0–34.0)
MCHC: 33.7 g/dL (ref 30.0–36.0)
MCV: 90.6 fL (ref 80.0–100.0)
Platelets: 265 K/uL (ref 150–400)
RBC: 4.49 MIL/uL (ref 4.22–5.81)
RDW: 13.3 % (ref 11.5–15.5)
WBC: 7.5 K/uL (ref 4.0–10.5)
nRBC: 0 % (ref 0.0–0.2)

## 2024-08-09 LAB — BASIC METABOLIC PANEL WITH GFR
Anion gap: 8 (ref 5–15)
BUN: 13 mg/dL (ref 8–23)
CO2: 23 mmol/L (ref 22–32)
Calcium: 8.7 mg/dL — ABNORMAL LOW (ref 8.9–10.3)
Chloride: 105 mmol/L (ref 98–111)
Creatinine, Ser: 1.06 mg/dL (ref 0.61–1.24)
GFR, Estimated: >60 mL/min (ref >60)
Glucose, Bld: 124 mg/dL — ABNORMAL HIGH (ref 70–99)
Potassium: 3.8 mmol/L (ref 3.5–5.1)
Sodium: 136 mmol/L (ref 135–145)

## 2024-08-09 LAB — GLUCOSE, CAPILLARY
Glucose-Capillary: 120 mg/dL — ABNORMAL HIGH (ref 70–99)
Glucose-Capillary: 124 mg/dL — ABNORMAL HIGH (ref 70–99)
Glucose-Capillary: 120 mg/dL — ABNORMAL HIGH (ref 70–99)

## 2024-08-09 LAB — GLIADIN ANTIBODIES, SERUM
Antigliadin Abs, IgA: 3 U (ref 0–19)
Gliadin IgG: 3 U (ref 0–19)

## 2024-08-09 LAB — HEMOGLOBIN A1C
Hgb A1c MFr Bld: 7 % — ABNORMAL HIGH (ref 4.8–5.6)
Mean Plasma Glucose: 154 mg/dL

## 2024-08-09 MED ORDER — SODIUM CHLORIDE 0.9 % IV SOLN: INTRAVENOUS | Status: DC

## 2024-08-09 MED ORDER — PEG 3350-KCL-NA BICARB-NACL 420 G PO SOLR
2000.0000 mL | Freq: Once | ORAL | Status: AC
  Administered 2024-08-10: 2000 mL via ORAL

## 2024-08-09 MED ORDER — PEG 3350-KCL-NA BICARB-NACL 420 G PO SOLR
2000.0000 mL | Freq: Once | ORAL | Status: DC
  Filled 2024-08-09: qty 4000

## 2024-08-09 MED ORDER — PEG 3350-KCL-NA BICARB-NACL 420 G PO SOLR
2000.0000 mL | Freq: Once | ORAL | Status: AC
  Administered 2024-08-09 (×2): 2000 mL via ORAL
  Filled 2024-08-09: qty 4000

## 2024-08-09 MED ORDER — SIMETHICONE 80 MG PO CHEW
240.0000 mg | CHEWABLE_TABLET | Freq: Once | ORAL | Status: AC
  Administered 2024-08-09 (×2): 240 mg via ORAL
  Filled 2024-08-09: qty 3

## 2024-08-09 MED ORDER — BISACODYL 5 MG PO TBEC
10.0000 mg | DELAYED_RELEASE_TABLET | Freq: Once | ORAL | Status: AC
  Administered 2024-08-09 (×2): 10 mg via ORAL
  Filled 2024-08-09: qty 2

## 2024-08-09 MED ORDER — SIMETHICONE 80 MG PO CHEW
240.0000 mg | CHEWABLE_TABLET | Freq: Once | ORAL | Status: AC
  Administered 2024-08-10: 240 mg via ORAL
  Filled 2024-08-09: qty 3

## 2024-08-09 NOTE — Care Management Obs Status (Signed)
 MEDICARE OBSERVATION STATUS NOTIFICATION   Patient Details  Name: Douglas Hurst. MRN: [id number] Date of Birth: May 04, 1941   Medicare Observation Status Notification Given:  Yes    Vonzell Arrie Sharps 08/09/2024, 8:51 AM

## 2024-08-09 NOTE — Plan of Care (Signed)
  Problem: Coping: Goal: Ability to adjust to condition or change in health will improve Outcome: Progressing   Problem: Health Behavior/Discharge Planning: Goal: Ability to identify and utilize available resources and services will improve Outcome: Progressing   Problem: Health Behavior/Discharge Planning: Goal: Ability to manage health-related needs will improve Outcome: Progressing

## 2024-08-09 NOTE — TOC CM/SW Note (Signed)
 Transition of Care [facility]) - Inpatient Brief Assessment   Patient Details  Name: Douglas Hurst. MRN: [id number] Date of Birth: 04/05/1941  [facility] CM/SW Contact:    Waddell Barnie Rama, RN Phone Number: 08/09/2024, 2:28 PM   Clinical Narrative: From home with spouse, has PCP and insurance on file, states has no HH services in place at this time , has wlaker, and a cane at home.  States family member  (wife) will transport them home at [employer] and family is support system, states gets medications from [facility] on [address].  Pta self ambulatory with cane.   Per Wife she states he needs a HHRN to help get his medications straightened out at home,  also would like some HHPT, HHOT.  She and patient states do not want to go to a SNF but would rather for [facility] services to be done at home.  NCM offered choice, she is ok with [facility].  NCM made referral to [facility] with [facility] , he is able to take referral .  Soc will begin 24 to 48 hrs post dc.     Transition of Care Asessment: Insurance and Status: Insurance coverage has been reviewed Patient has primary care physician: Yes Home environment has been reviewed: from home with wife Prior level of function:: ambulatory with cane Prior/Current Home Services: Current home services (walker, don't use, but uses cane) Social Drivers of Health Review: SDOH reviewed no interventions necessary Readmission risk has been reviewed: Yes Transition of care needs: transition of care needs identified, TOC will continue to follow

## 2024-08-09 NOTE — Evaluation (Signed)
 Physical Therapy Evaluation Patient Details Name: Douglas Hurst. MRN: [id number] DOB: 1941/12/28 Today's Date: 08/09/2024  History of Present Illness  Pt is an 83 y.o. male who presented 08/08/24 with increasing bright red blood in his stool intermittently for the past month. He saw GI outpatient and was scheduled to get a colonoscopy in September. Reportedly he was checked for C. difficile and GI pathogen panel outpatient and they were negative. PMH includes: bipolar d/o, DMII, HTN, HLD, GERD, COPD, PAF on Eliquis    Clinical Impression  Pt presents with condition above and deficits mentioned below, see PT Problem List. PTA, he was mod I utilizing a QC and intermittently no AD for functional mobility, living with his wife in a 2-level house with a level entry. They do not access the second level of the house. Currently, the pt is demonstrating deficits in generalized strength, balance, and activity tolerance. He is currently requiring CGA-minA for bed mobility, CGA for transfers, and CGA-minA to ambulate x1 HHA vs RW. He reports his wife can assist him some at home if needed. While the pt is not functioning far from his baseline, he could benefit from HHPT to maximize his return to baseline and improve his safety with mobility by reducing his risk for falls. Will continue to follow acutely.         If plan is discharge home, recommend the following: A little help with walking and/or transfers;A little help with bathing/dressing/bathroom;Assistance with cooking/housework;Assist for transportation   Can travel by private vehicle        Equipment Recommendations None recommended by PT  Recommendations for Other Services       Functional Status Assessment Patient has had a recent decline in their functional status and demonstrates the ability to make significant improvements in function in a reasonable and predictable amount of time.     Precautions / Restrictions Precautions Precautions:  Fall Recall of Precautions/Restrictions: Intact Restrictions Weight Bearing Restrictions Per Provider Order: No      Mobility  Bed Mobility Overal bed mobility: Needs Assistance Bed Mobility: Supine to Sit, Sit to Supine     Supine to sit: Min assist, HOB elevated Sit to supine: Contact guard assist, HOB elevated   General bed mobility comments: Pt requesting HHA minA to pull trunk up to sit L EOB from elevated HOB. Pt able to return himself to supine with CGA for safety    Transfers Overall transfer level: Needs assistance Equipment used: Rolling walker (2 wheels) Transfers: Sit to/from Stand Sit to Stand: Contact guard assist           General transfer comment: CGA for safety transferring to stand from EOB to RW, mild instability noted but no LOB    Ambulation/Gait Ambulation/Gait assistance: Contact guard assist, Min assist Gait Distance (Feet): 110 Feet Assistive device: Rolling walker (2 wheels), 1 person hand held assist Gait Pattern/deviations: Step-through pattern, Decreased step length - left, Decreased step length - right, Decreased stride length, Decreased dorsiflexion - left, Decreased dorsiflexion - right, Trunk flexed Gait velocity: reduced Gait velocity interpretation: 1.31 - 2.62 ft/sec, indicative of limited community ambulator   General Gait Details: Pt ambulates with a flexed posture, more so when using the RW than with R HHA. Pt takes slow, small, mildly unsteady steps but did not have any LOB. CGA with intermittent light minA for balance and safety, ambulating approximately half with the RW and half with R HHA to try to simulate use of his cane  Stairs  Wheelchair Mobility     Tilt Bed    Modified Rankin (Stroke Patients Only)       Balance Overall balance assessment: Needs assistance Sitting-balance support: No upper extremity supported, Feet supported Sitting balance-Leahy Scale: Good     Standing balance support:  Single extremity supported, Bilateral upper extremity supported, During functional activity, No upper extremity supported Standing balance-Leahy Scale: Fair Standing balance comment: Able to stand statically without UE support, but relies on at least 1 UE support to ambulate.                             Pertinent Vitals/Pain Pain Assessment Pain Assessment: Faces Faces Pain Scale: No hurt Pain Intervention(s): Monitored during session    Home Living Family/patient expects to be discharged to:: Private residence Living Arrangements: Spouse/significant other Available Help at Discharge: Family;Available 24 hours/day Type of Home: House Home Access: Level entry       Home Layout: Two level;Able to live on main level with bedroom/bathroom (does not go upstairs) Home Equipment: Agricultural consultant (2 wheels);Cane - quad;BSC/3in1;Shower seat;Grab bars - tub/shower      Prior Function Prior Level of Function : Independent/Modified Independent             Mobility Comments: Uses QC the majority of the time, but intermittently no AD; denies any recent falls       Extremity/Trunk Assessment   Upper Extremity Assessment Upper Extremity Assessment: Defer to OT evaluation    Lower Extremity Assessment Lower Extremity Assessment: Generalized weakness (denied numbness/tingling bil; MMT scores of 4/5 hip flexion bil, 4+ knee extension bil; 4+ ankle dorsiflexion bil)    Cervical / Trunk Assessment Cervical / Trunk Assessment: Normal  Communication   Communication Communication: Impaired Factors Affecting Communication: Hearing impaired    Cognition Arousal: Alert Behavior During Therapy: WFL for tasks assessed/performed   PT - Cognitive impairments: No family/caregiver present to determine baseline                       PT - Cognition Comments: Likely at baseline with noted delayed processing likely due to being [facility]. Seems aware of his deficits Following  commands: Intact       Cueing Cueing Techniques: Verbal cues     General Comments      Exercises     Assessment/Plan    PT Assessment Patient needs continued PT services  PT Problem List Decreased strength;Decreased balance;Decreased activity tolerance;Decreased mobility       PT Treatment Interventions DME instruction;Gait training;Functional mobility training;Therapeutic exercise;Therapeutic activities;Balance training;Neuromuscular re-education;Patient/family education    PT Goals (Current goals can be found in the Care Plan section)  Acute Rehab PT Goals Patient Stated Goal: to get stronger PT Goal Formulation: With patient Time For Goal Achievement: 08/23/24 Potential to Achieve Goals: Good    Frequency Min 2X/week     Co-evaluation               AM-PAC PT 6 Clicks Mobility  Outcome Measure Help needed turning from your back to your side while in a flat bed without using bedrails?: A Little Help needed moving from lying on your back to sitting on the side of a flat bed without using bedrails?: A Little Help needed moving to and from a bed to a chair (including a wheelchair)?: A Little Help needed standing up from a chair using your arms (e.g., wheelchair or bedside chair)?: A Little Help  needed to walk in hospital room?: A Little Help needed climbing 3-5 steps with a railing? : A Little 6 Click Score: 18    End of Session Equipment Utilized During Treatment: Gait belt Activity Tolerance: Patient tolerated treatment well Patient left: in bed;with call bell/phone within reach;with bed alarm set   PT Visit Diagnosis: Unsteadiness on feet (R26.81);Other abnormalities of gait and mobility (R26.89);Muscle weakness (generalized) (M62.81)    Time: 1620-1630 PT Time Calculation (min) (ACUTE ONLY): 10 min   Charges:   PT Evaluation $PT Eval Moderate Complexity: 1 Mod   PT General Charges $$ ACUTE PT VISIT: 1 Visit         Theo Ferretti, PT,  DPT Acute Rehabilitation Services  Office: [phone]   Theo CHRISTELLA Ferretti 08/09/2024, 4:45 PM

## 2024-08-09 NOTE — Progress Notes (Signed)
 PROGRESS NOTE  Douglas Hurst Douglas Hurst. FMW:[id number] DOB: [birth year] DOA: 08/08/2024 PCP: Charlott Dorn LABOR, MD   LOS: 0 days   Brief Narrative / Interim history: 83 year old male with DM2, COPD, HTN, PAF on Eliquis  comes into the hospital with bright red blood in his stool, has been on and off going on for the past month.  He saw GI as an outpatient and was scheduled to get a colonoscopy in September.  He does not have any abdominal pain, nausea or vomiting.  He also has been having significant diarrhea, was checked for C. difficile and GI pathogen panel as an outpatient and apparently they were negative.  He has had increased bleeding and decided to come to the hospital.  GI consulted  Subjective / 24h Interval events: Doing well today.  His wife is at bedside.  Has been having some blood in his stools even this morning  Assesement and Plan: Principal Problem:   Lower GI bleed Active Problems:   Diarrhea   Paroxysmal atrial fibrillation (HCC)   Gastroesophageal reflux disease   Essential hypertension   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HCC)   Chronic obstructive pulmonary disease (HCC)   Abdominal aortic aneurysm (HCC)   Bipolar disorder (HCC)   Mixed hyperlipidemia   Principal problem Lower GI bleed-somewhat subacute, has been going on for the past month.  GI consulted, plans for endoscopic evaluation tomorrow - Hold Eliquis   Active problems PAF-currently in sinus, he does not appear to be on nodal agents.  Hold Eliquis   Essential hypertension-continue spironolactone  and Lasix  MWF  Diarrhea-colonoscopy tomorrow  Chronic obstructive pulmonary disease -no wheezing, this is stable  Abdominal aortic aneurysm - Followed by vascular surgery with regular monitoring    Bipolar disorder - Mood stable, on no medications    Mixed hyperlipidemia - Continue pravachol    Type 2 diabetes mellitus, with hyperglycemia-placed on sliding  scale  CBG (last 3)  Recent Labs    08/08/24 2125 08/09/24 0604 08/09/24 1044  GLUCAP 191* 120* 124*    Scheduled Meds:  bisacodyl   10 mg Oral Once   donepezil   5 mg Oral QHS   finasteride   5 mg Oral Daily   furosemide   20 mg Oral Q M,W,F   insulin  aspart  0-9 Units Subcutaneous TID WC   multivitamin with minerals  1 tablet Oral Daily   pantoprazole  (PROTONIX ) IV  40 mg Intravenous Q24H   polyethylene glycol-electrolytes  2,000 mL Oral Once   Followed by   [START ON 08/10/2024] polyethylene glycol-electrolytes  2,000 mL Oral Once   pravastatin   40 mg Oral QPM   simethicone   240 mg Oral Once   Followed by   simethicone   240 mg Oral Once   spironolactone   12.5 mg Oral Daily   tamsulosin   0.4 mg Oral QPC supper   Continuous Infusions:  sodium chloride      sodium chloride      PRN Meds:.acetaminophen  **OR** acetaminophen , ipratropium, ondansetron  **OR** ondansetron  (ZOFRAN ) IV  Current Outpatient Medications  Medication Instructions   acetaminophen  (TYLENOL ) 650 mg, Oral, Every 6 hours PRN   donepezil  (ARICEPT ) 10 MG tablet TAKE 1 TABLET BY MOUTH EVERYDAY AT BEDTIME   donepezil  (ARICEPT ) 5 mg, Oral, Daily at bedtime   Eliquis  5 mg, Oral, 2 times daily   finasteride  (PROSCAR ) 5 mg, Daily   furosemide  (LASIX ) 20 mg, Oral, Daily   Multiple Vitamin (MULTIVITAMIN WITH MINERALS) TABS tablet 1 tablet, Daily   pantoprazole  (PROTONIX ) 40 mg, Daily  polyethylene glycol powder (GLYCOLAX /MIRALAX ) 17 g, Oral, Daily   pravastatin  (PRAVACHOL ) 40 mg, Oral, Every evening   SPIRIVA  RESPIMAT 2.5 MCG/ACT AERS 2 puffs, Daily PRN   spironolactone  (ALDACTONE ) 25 mg, Oral, Daily   tadalafil (CIALIS) 20 mg, Oral, Every 3 DAYS PRN   tamsulosin  (FLOMAX ) 0.4 mg, Daily after supper    Diet Orders (From admission, onward)     Start     Ordered   08/10/24 0001  Diet NPO time specified  Diet effective midnight        08/09/24 1250   08/09/24 1251  Diet clear liquid Fluid consistency: Thin  Diet  effective now       Question:  Fluid consistency:  Answer:  Thin   08/09/24 1250            DVT prophylaxis: SCDs Start: 08/08/24 1803   Lab Results  Component Value Date   PLT 265 08/09/2024      Code Status: Full Code  Family Communication: Wife at bedside  Status is: Observation The patient will require care spanning > 2 midnights and should be moved to inpatient because: Endoscopic evaluation tomorrow, ongoing bleeding   Level of care: Progressive  Consultants:  Gastroenterology  Objective: Vitals:   08/08/24 2001 08/08/24 2318 08/09/24 0317 08/09/24 0800  BP: 136/67 (!) 144/71 132/71 (!) 140/72  Pulse: 88 85 91 85  Resp:  18 18 18   Temp: (!) 97.5 F (36.4 C) (!) 97 F (36.1 C) 97.8 F (36.6 C) (!) 97.5 F (36.4 C)  TempSrc: Oral Axillary Oral Oral  SpO2: 97% 94% 95% 98%  Weight:      Height:        Intake/Output Summary (Last 24 hours) at 08/09/2024 1327 Last data filed at 08/09/2024 0300 Gross per 24 hour  Intake --  Output 300 ml  Net -300 ml   Wt Readings from Last 3 Encounters:  08/08/24 85.5 kg  07/08/24 89.5 kg  04/19/24 93 kg    Examination:  Constitutional: NAD Eyes: no scleral icterus ENMT: Mucous membranes are moist.  Neck: normal, supple Respiratory: clear to auscultation bilaterally, no wheezing, no crackles. Normal respiratory effort. No accessory muscle use.  Cardiovascular: Regular rate and rhythm, no murmurs / rubs / gallops. No LE edema.  Abdomen: non distended, no tenderness. Bowel sounds positive.  Musculoskeletal: no clubbing / cyanosis.   Data Reviewed: I have independently reviewed following labs and imaging studies   CBC Recent Labs  Lab 08/08/24 1400 08/08/24 1645 08/08/24 2233 08/09/24 0456  WBC 7.6 6.7 7.6 7.5  HGB 14.1 13.7 12.6* 13.7  HCT 43.2 42.1 38.3* 40.7  PLT 270 246 258 265  MCV 93.1 94.0 91.8 90.6  MCH 30.4 30.6 30.2 30.5  MCHC 32.6 32.5 32.9 33.7  RDW 13.4 13.6 13.2 13.3    Recent Labs   Lab 08/08/24 1400 08/08/24 1645 08/08/24 1827 08/09/24 0456  NA 137  --   --  136  K 3.9  --   --  3.8  CL 102  --   --  105  CO2 23  --   --  23  GLUCOSE 133*  --   --  124*  BUN 15  --   --  13  CREATININE 1.29*  --   --  1.06  CALCIUM  9.2  --   --  8.7*  AST 19  --   --   --   ALT 14  --   --   --  ALKPHOS 66  --   --   --   BILITOT 1.1  --   --   --   ALBUMIN 3.2*  --   --   --   CRP  --   --  1.2*  --   INR  --   --  1.4*  --   TSH  --   --  0.544  --   HGBA1C  --  7.0*  --   --     ------------------------------------------------------------------------------------------------------------------ No results for input(s): CHOL, HDL, LDLCALC, TRIG, CHOLHDL, LDLDIRECT in the last 72 hours.  Lab Results  Component Value Date   HGBA1C 7.0 (H) 08/08/2024   ------------------------------------------------------------------------------------------------------------------ Recent Labs    08/08/24 1827  TSH 0.544    Cardiac Enzymes No results for input(s): CKMB, TROPONINI, MYOGLOBIN in the last 168 hours.  Invalid input(s): CK ------------------------------------------------------------------------------------------------------------------    Component Value Date/Time   BNP 56.5 08/06/2023 1005    CBG: Recent Labs  Lab 08/08/24 1655 08/08/24 1824 08/08/24 2125 08/09/24 0604 08/09/24 1044  GLUCAP 106* 99 191* 120* 124*    No results found for this or any previous visit (from the past 240 hours).   Radiology Studies: No results found.   Nilda Fendt, MD, PhD [facility]  Between 7 am - 7 pm I am available, please contact me via Amion (for emergencies) or Securechat (non urgent messages)  Between 7 pm - 7 am I am not available, please contact night coverage MD/APP via Amion

## 2024-08-09 NOTE — H&P (View-Only) (Signed)
 Referring Provider: [facility] Primary Care Physician:  Charlott Dorn LABOR, MD Primary Gastroenterologist: [facility]  Reason for Consultation: GI bleed  HPI: Douglas Mase. is a 83 y.o. male with past medical history of atrial fibrillation on Eliquis , history of COPD, hypertension, bipolar disorder presented to the hospital with bright red blood per rectum which has been intermittent for last 1 month.  He was seen in the GI office recently for evaluation of blood in the stool, dysphagia as well as FOBT positive stool and plan was for outpatient EGD with dilation and colonoscopy after receiving clearance to hold his Eliquis ..  Was also seen by PCP for diarrhea and GI pathogen panel was negative.  C. difficile was also negative last month.  He was also seen in the emergency department last 1 with rectal bleeding.  CT angio at that time showed no evidence of active bleeding but showed rectal wall thickening concerning for proctitis and diverticulosis.  Also showed some thickening of the wall of the esophagus along with hiatal hernia.  Blood work here showed normal CBC.  Normal CMP except mild elevated creatinine at 1.29.  INR of 1.4.  Mild elevated CRP and ESR.  Past Medical History:  Diagnosis Date   Bipolar disorder (HCC)    Chronic back pain    Complication of anesthesia    prolonged sedation and confusion   COPD (chronic obstructive pulmonary disease) (HCC)    Dr. 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   Diverticulosis    Dysrhythmia    Afib  onset 03-2022   ED (erectile dysfunction)    Gallstones    GERD (gastroesophageal reflux disease)    Hiatal hernia    Hyperlipidemia    Hypertension    Weakness 03/29/2023   Wears dentures    upper   Wears glasses    Wears hearing aid in both ears     Past Surgical History:  Procedure Laterality Date   BALLOON DILATION N/A 10/01/2015   Procedure: BALLOON DILATION;  Surgeon: Gladis MARLA Louder, MD;  Location: [facility];  Service: Endoscopy;  Laterality: N/A;   CATARACT EXTRACTION, BILATERAL     CHOLECYSTECTOMY N/A 07/08/2022   Procedure: LAPAROSCOPIC CHOLECYSTECTOMY WITH INTRAOPERATIVE CHOLANGIOGRAM AND LAPAROSCOPIC COMMON DUCT EXPLORATION;  Surgeon: Lyndel Deward PARAS, MD;  Location: [facility];  Service: General;  Laterality: N/A;   COLONOSCOPY     removed polyps   COLONOSCOPY WITH PROPOFOL  N/A 09/01/2016   Procedure: COLONOSCOPY WITH PROPOFOL ;  Surgeon: Gladis MARLA Louder, MD;  Location: [facility];  Service: Endoscopy;  Laterality: N/A;   ERCP N/A 07/09/2022   Procedure: ENDOSCOPIC RETROGRADE CHOLANGIOPANCREATOGRAPHY (ERCP);  Surgeon: Rosalie Kitchens, MD;  Location: [facility];  Service: Gastroenterology;  Laterality: N/A;   ESOPHAGEAL DILATION  06/23/2022   Procedure: PYLORIC  DILATION;  Surgeon: Rosalie Kitchens, MD;  Location: [facility];  Service: Gastroenterology;;   ESOPHAGOGASTRODUODENOSCOPY N/A 06/23/2022   Procedure: ESOPHAGOGASTRODUODENOSCOPY (EGD);  Surgeon: Rosalie Kitchens, MD;  Location: [facility];  Service: Gastroenterology;  Laterality: N/A;   ESOPHAGOGASTRODUODENOSCOPY (EGD) WITH PROPOFOL  N/A 10/01/2015   Procedure: ESOPHAGOGASTRODUODENOSCOPY (EGD) WITH PROPOFOL ;  Surgeon: Gladis MARLA Louder, MD;  Location: [facility];  Service: Endoscopy;  Laterality: N/A;   FISTULOTOMY  07/09/2022   Procedure: FISTULOTOMY;  Surgeon: Rosalie Kitchens, MD;  Location: [facility];  Service: Gastroenterology;;   HEMOSTASIS CLIP PLACEMENT  06/23/2022   Procedure: HEMOSTASIS CLIP PLACEMENT;  Surgeon: Rosalie Kitchens, MD;  Location: [facility];  Service: Gastroenterology;;  IR CATHETER TUBE CHANGE  08/20/2022   IR EXCHANGE BILIARY DRAIN  05/15/2022   IR EXCHANGE BILIARY DRAIN  05/26/2022   IR EXCHANGE BILIARY DRAIN  06/11/2022   IR EXCHANGE BILIARY DRAIN  06/23/2022   IR PERC CHOLECYSTOSTOMY  04/26/2022   IR RADIOLOGIST EVAL & MGMT  09/16/2022   KNEE ARTHROSCOPY     TONSILLECTOMY      Prior to Admission medications    Medication Sig Start Date End Date Taking? Authorizing Provider  acetaminophen  (TYLENOL ) 325 MG tablet Take 2 tablets (650 mg total) by mouth every 6 (six) hours as needed for mild pain (or Fever >/= 101). 04/30/22  Yes Sheikh, Omair Latif, DO  donepezil  (ARICEPT ) 5 MG tablet Take 5 mg by mouth at bedtime.   Yes [provider]  ELIQUIS  5 MG TABS tablet TAKE 1 TABLET BY MOUTH TWICE A DAY 07/25/24  Yes Walker, Caitlin S, NP  finasteride  (PROSCAR ) 5 MG tablet Take 5 mg by mouth daily.   Yes [provider]  furosemide  (LASIX ) 20 MG tablet Take 1 tablet (20 mg total) by mouth daily. 11/12/23  Yes Lonni Slain, MD  Multiple Vitamin (MULTIVITAMIN WITH MINERALS) TABS tablet Take 1 tablet by mouth daily.   Yes [provider]  pantoprazole  (PROTONIX ) 40 MG tablet Take 40 mg by mouth daily.   Yes [provider]  polyethylene glycol powder (GLYCOLAX /MIRALAX ) 17 GM/SCOOP powder Take 17 g by mouth daily. Patient taking differently: Take 17 g by mouth daily as needed for mild constipation or moderate constipation. 04/30/22  Yes Sheikh, Omair Latif, DO  pravastatin  (PRAVACHOL ) 40 MG tablet Take 1 tablet (40 mg total) by mouth every evening. 05/19/24  Yes Raford Riggs, MD  SPIRIVA  RESPIMAT 2.5 MCG/ACT AERS Inhale 2 puffs into the lungs daily as needed (COPD). 06/04/22  Yes [provider]  spironolactone  (ALDACTONE ) 25 MG tablet TAKE 1 TABLET (25 MG TOTAL) BY MOUTH DAILY. Patient taking differently: Take 12.5 mg by mouth daily. 03/09/24  Yes Vannie Reche RAMAN, NP  tamsulosin  (FLOMAX ) 0.4 MG CAPS capsule Take 0.4 mg by mouth daily after supper. 05/13/22  Yes [provider]  donepezil  (ARICEPT ) 10 MG tablet TAKE 1 TABLET BY MOUTH EVERYDAY AT BEDTIME Patient not taking: Reported on 08/08/2024 07/18/24   Whitfield Raisin, NP  tadalafil (CIALIS) 20 MG tablet Take 20 mg by mouth every three (3) days as needed for erectile dysfunction. 03/29/22   [provider]    Scheduled Meds:  donepezil   5 mg Oral QHS   finasteride   5 mg Oral Daily   furosemide   20 mg Oral Q M,W,F   insulin  aspart  0-9 Units Subcutaneous TID WC   multivitamin with minerals  1 tablet Oral Daily   pantoprazole  (PROTONIX ) IV  40 mg Intravenous Q24H   pravastatin   40 mg Oral QPM   spironolactone   12.5 mg Oral Daily   tamsulosin   0.4 mg Oral QPC supper   Continuous Infusions: PRN Meds:.acetaminophen  **OR** acetaminophen , ipratropium, ondansetron  **OR** ondansetron  (ZOFRAN ) IV  Allergies as of 08/08/2024   (No Known Allergies)    Family History  Problem Relation Age of Onset   Heart attack Mother    Cancer - Colon Mother    CAD Mother    Cancer Mother    Dementia Mother    CAD Father    Cancer Father    CVA Father    Dementia Father    Heart attack Brother    CAD Brother  Social History   Socioeconomic History   Marital status: Married    Spouse name: Not on file   Number of children: Not on file   Years of education: Not on file   Highest education level: Not on file  Occupational History   Not on file  Tobacco Use   Smoking status: Former    Types: Cigarettes   Smokeless tobacco: Never  Vaping Use   Vaping status: Never Used  Substance and Sexual Activity   Alcohol use: No   Drug use: No   Sexual activity: Not Currently  Other Topics Concern   Not on file  Social History Narrative   Not on file   Social Drivers of Health   Financial Resource Strain: Not on file  Food Insecurity: Unknown (08/08/2024)   Hunger Vital Sign    Worried About Running Out of Food in the Last Year: Never true    Ran Out of Food in the Last Year: Not on file  Transportation Needs: No Transportation Needs (08/08/2024)   PRAPARE - Administrator, Civil Service (Medical): No    Lack of Transportation (Non-Medical): No  Physical Activity: Not on file  Stress: Not on file  Social Connections: Socially Integrated (08/08/2024)   Social  Connection and Isolation Panel    Frequency of Communication with Friends and Family: More than three times a week    Frequency of Social Gatherings with Friends and Family: More than three times a week    Attends Religious Services: More than 4 times per year    Active Member of [employer] or Organizations: No    Attends Engineer, structural: More than 4 times per year    Marital Status: Married  Catering manager Violence: Not At Risk (08/08/2024)   Humiliation, Afraid, Rape, and Kick questionnaire    Fear of Current or Ex-Partner: No    Emotionally Abused: No    Physically Abused: No    Sexually Abused: No    Review of Systems: All negative except as stated above in HPI.  Physical Exam: Vital signs: Vitals:   08/09/24 0317 08/09/24 0800  BP: 132/71 (!) 140/72  Pulse: 91 85  Resp: 18 18  Temp: 97.8 F (36.6 C) (!) 97.5 F (36.4 C)  SpO2: 95% 98%   Last BM Date : 08/08/24 General:   Alert,  Well-developed, well-nourished, pleasant and cooperative in NAD Lungs: No visible respiratory distress Heart:  Regular rate and rhythm; no murmurs, clicks, rubs,  or gallops. Abdomen: Soft, nontender, nondistended, bowel sound present, no peritoneal signs Mood and affect normal Alert and oriented x 3 Rectal:  Deferred  GI:  Lab Results: Recent Labs    08/08/24 1645 08/08/24 2233 08/09/24 0456  WBC 6.7 7.6 7.5  HGB 13.7 12.6* 13.7  HCT 42.1 38.3* 40.7  PLT 246 258 265   BMET Recent Labs    08/08/24 1400 08/09/24 0456  NA 137 136  K 3.9 3.8  CL 102 105  CO2 23 23  GLUCOSE 133* 124*  BUN 15 13  CREATININE 1.29* 1.06  CALCIUM  9.2 8.7*   LFT Recent Labs    08/08/24 1400  PROT 6.8  ALBUMIN 3.2*  AST 19  ALT 14  ALKPHOS 66  BILITOT 1.1   PT/INR Recent Labs    08/08/24 1827  LABPROT 17.9*  INR 1.4*     Studies/Results: No results found.  Impression/Plan: -Rectal bleeding with normal hemoglobin.  Intermittent bleeding for last 1 month. -  Esophageal  dysphagia. - Heme positive stool - History of atrial fibrillation.  Last dose of Eliquis  yesterday morning. - History of diarrhea.  GI pathogen panel and C. difficile negative recently.  Recommendations --------------------------- - Patient and his family prefers inpatient endoscopic workup.  plan for EGD with dilation and colonoscopy tomorrow. - Continue to hold Eliquis  - Okay to have clear liquids diet today.  Start colonoscopy prep this afternoon.  Keep n.p.o. past midnight.  Risks (bleeding, infection, bowel perforation that could require surgery, sedation-related changes in cardiopulmonary systems), benefits (identification and possible treatment of source of symptoms, exclusion of certain causes of symptoms), and alternatives (watchful waiting, radiographic imaging studies, empiric medical treatment)  were explained to patient/family in detail and patient wishes to proceed.     LOS: 0 days   Layla Lah  MD, FACP 08/09/2024, 11:32 AM  Contact #  [phone]

## 2024-08-09 NOTE — Consult Note (Signed)
 Referring Provider: [facility] Primary Care Physician:  Charlott Dorn LABOR, MD Primary Gastroenterologist: [facility]  Reason for Consultation: GI bleed  HPI: Douglas Hurst. is a 83 y.o. male with past medical history of atrial fibrillation on Eliquis , history of COPD, hypertension, bipolar disorder presented to the hospital with bright red blood per rectum which has been intermittent for last 1 month.  He was seen in the GI office recently for evaluation of blood in the stool, dysphagia as well as FOBT positive stool and plan was for outpatient EGD with dilation and colonoscopy after receiving clearance to hold his Eliquis ..  Was also seen by PCP for diarrhea and GI pathogen panel was negative.  C. difficile was also negative last month.  He was also seen in the emergency department last 1 with rectal bleeding.  CT angio at that time showed no evidence of active bleeding but showed rectal wall thickening concerning for proctitis and diverticulosis.  Also showed some thickening of the wall of the esophagus along with hiatal hernia.  Blood work here showed normal CBC.  Normal CMP except mild elevated creatinine at 1.29.  INR of 1.4.  Mild elevated CRP and ESR.  Past Medical History:  Diagnosis Date   Bipolar disorder (HCC)    Chronic back pain    Complication of anesthesia    prolonged sedation and confusion   COPD (chronic obstructive pulmonary disease) (HCC)    Dr. 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   Diverticulosis    Dysrhythmia    Afib  onset 03-2022   ED (erectile dysfunction)    Gallstones    GERD (gastroesophageal reflux disease)    Hiatal hernia    Hyperlipidemia    Hypertension    Weakness 03/29/2023   Wears dentures    upper   Wears glasses    Wears hearing aid in both ears     Past Surgical History:  Procedure Laterality Date   BALLOON DILATION N/A 10/01/2015   Procedure: BALLOON DILATION;  Surgeon: Gladis MARLA Louder, MD;  Location: [facility];  Service: Endoscopy;  Laterality: N/A;   CATARACT EXTRACTION, BILATERAL     CHOLECYSTECTOMY N/A 07/08/2022   Procedure: LAPAROSCOPIC CHOLECYSTECTOMY WITH INTRAOPERATIVE CHOLANGIOGRAM AND LAPAROSCOPIC COMMON DUCT EXPLORATION;  Surgeon: Lyndel Deward PARAS, MD;  Location: [facility];  Service: General;  Laterality: N/A;   COLONOSCOPY     removed polyps   COLONOSCOPY WITH PROPOFOL  N/A 09/01/2016   Procedure: COLONOSCOPY WITH PROPOFOL ;  Surgeon: Gladis MARLA Louder, MD;  Location: [facility];  Service: Endoscopy;  Laterality: N/A;   ERCP N/A 07/09/2022   Procedure: ENDOSCOPIC RETROGRADE CHOLANGIOPANCREATOGRAPHY (ERCP);  Surgeon: Rosalie Kitchens, MD;  Location: [facility];  Service: Gastroenterology;  Laterality: N/A;   ESOPHAGEAL DILATION  06/23/2022   Procedure: PYLORIC  DILATION;  Surgeon: Rosalie Kitchens, MD;  Location: [facility];  Service: Gastroenterology;;   ESOPHAGOGASTRODUODENOSCOPY N/A 06/23/2022   Procedure: ESOPHAGOGASTRODUODENOSCOPY (EGD);  Surgeon: Rosalie Kitchens, MD;  Location: [facility];  Service: Gastroenterology;  Laterality: N/A;   ESOPHAGOGASTRODUODENOSCOPY (EGD) WITH PROPOFOL  N/A 10/01/2015   Procedure: ESOPHAGOGASTRODUODENOSCOPY (EGD) WITH PROPOFOL ;  Surgeon: Gladis MARLA Louder, MD;  Location: [facility];  Service: Endoscopy;  Laterality: N/A;   FISTULOTOMY  07/09/2022   Procedure: FISTULOTOMY;  Surgeon: Rosalie Kitchens, MD;  Location: [facility];  Service: Gastroenterology;;   HEMOSTASIS CLIP PLACEMENT  06/23/2022   Procedure: HEMOSTASIS CLIP PLACEMENT;  Surgeon: Rosalie Kitchens, MD;  Location: [facility];  Service: Gastroenterology;;  IR CATHETER TUBE CHANGE  08/20/2022   IR EXCHANGE BILIARY DRAIN  05/15/2022   IR EXCHANGE BILIARY DRAIN  05/26/2022   IR EXCHANGE BILIARY DRAIN  06/11/2022   IR EXCHANGE BILIARY DRAIN  06/23/2022   IR PERC CHOLECYSTOSTOMY  04/26/2022   IR RADIOLOGIST EVAL & MGMT  09/16/2022   KNEE ARTHROSCOPY     TONSILLECTOMY      Prior to Admission medications    Medication Sig Start Date End Date Taking? Authorizing Provider  acetaminophen  (TYLENOL ) 325 MG tablet Take 2 tablets (650 mg total) by mouth every 6 (six) hours as needed for mild pain (or Fever >/= 101). 04/30/22  Yes Sheikh, Omair Latif, DO  donepezil  (ARICEPT ) 5 MG tablet Take 5 mg by mouth at bedtime.   Yes [provider]  ELIQUIS  5 MG TABS tablet TAKE 1 TABLET BY MOUTH TWICE A DAY 07/25/24  Yes Walker, Caitlin S, NP  finasteride  (PROSCAR ) 5 MG tablet Take 5 mg by mouth daily.   Yes [provider]  furosemide  (LASIX ) 20 MG tablet Take 1 tablet (20 mg total) by mouth daily. 11/12/23  Yes Lonni Slain, MD  Multiple Vitamin (MULTIVITAMIN WITH MINERALS) TABS tablet Take 1 tablet by mouth daily.   Yes [provider]  pantoprazole  (PROTONIX ) 40 MG tablet Take 40 mg by mouth daily.   Yes [provider]  polyethylene glycol powder (GLYCOLAX /MIRALAX ) 17 GM/SCOOP powder Take 17 g by mouth daily. Patient taking differently: Take 17 g by mouth daily as needed for mild constipation or moderate constipation. 04/30/22  Yes Sheikh, Omair Latif, DO  pravastatin  (PRAVACHOL ) 40 MG tablet Take 1 tablet (40 mg total) by mouth every evening. 05/19/24  Yes Raford Riggs, MD  SPIRIVA  RESPIMAT 2.5 MCG/ACT AERS Inhale 2 puffs into the lungs daily as needed (COPD). 06/04/22  Yes [provider]  spironolactone  (ALDACTONE ) 25 MG tablet TAKE 1 TABLET (25 MG TOTAL) BY MOUTH DAILY. Patient taking differently: Take 12.5 mg by mouth daily. 03/09/24  Yes Vannie Reche RAMAN, NP  tamsulosin  (FLOMAX ) 0.4 MG CAPS capsule Take 0.4 mg by mouth daily after supper. 05/13/22  Yes [provider]  donepezil  (ARICEPT ) 10 MG tablet TAKE 1 TABLET BY MOUTH EVERYDAY AT BEDTIME Patient not taking: Reported on 08/08/2024 07/18/24   Whitfield Raisin, NP  tadalafil (CIALIS) 20 MG tablet Take 20 mg by mouth every three (3) days as needed for erectile dysfunction. 03/29/22   [provider]    Scheduled Meds:  donepezil   5 mg Oral QHS   finasteride   5 mg Oral Daily   furosemide   20 mg Oral Q M,W,F   insulin  aspart  0-9 Units Subcutaneous TID WC   multivitamin with minerals  1 tablet Oral Daily   pantoprazole  (PROTONIX ) IV  40 mg Intravenous Q24H   pravastatin   40 mg Oral QPM   spironolactone   12.5 mg Oral Daily   tamsulosin   0.4 mg Oral QPC supper   Continuous Infusions: PRN Meds:.acetaminophen  **OR** acetaminophen , ipratropium, ondansetron  **OR** ondansetron  (ZOFRAN ) IV  Allergies as of 08/08/2024   (No Known Allergies)    Family History  Problem Relation Age of Onset   Heart attack Mother    Cancer - Colon Mother    CAD Mother    Cancer Mother    Dementia Mother    CAD Father    Cancer Father    CVA Father    Dementia Father    Heart attack Brother    CAD Brother  Social History   Socioeconomic History   Marital status: Married    Spouse name: Not on file   Number of children: Not on file   Years of education: Not on file   Highest education level: Not on file  Occupational History   Not on file  Tobacco Use   Smoking status: Former    Types: Cigarettes   Smokeless tobacco: Never  Vaping Use   Vaping status: Never Used  Substance and Sexual Activity   Alcohol use: No   Drug use: No   Sexual activity: Not Currently  Other Topics Concern   Not on file  Social History Narrative   Not on file   Social Drivers of Health   Financial Resource Strain: Not on file  Food Insecurity: Unknown (08/08/2024)   Hunger Vital Sign    Worried About Running Out of Food in the Last Year: Never true    Ran Out of Food in the Last Year: Not on file  Transportation Needs: No Transportation Needs (08/08/2024)   PRAPARE - Administrator, Civil Service (Medical): No    Lack of Transportation (Non-Medical): No  Physical Activity: Not on file  Stress: Not on file  Social Connections: Socially Integrated (08/08/2024)   Social  Connection and Isolation Panel    Frequency of Communication with Friends and Family: More than three times a week    Frequency of Social Gatherings with Friends and Family: More than three times a week    Attends Religious Services: More than 4 times per year    Active Member of [employer] or Organizations: No    Attends Engineer, structural: More than 4 times per year    Marital Status: Married  Catering manager Violence: Not At Risk (08/08/2024)   Humiliation, Afraid, Rape, and Kick questionnaire    Fear of Current or Ex-Partner: No    Emotionally Abused: No    Physically Abused: No    Sexually Abused: No    Review of Systems: All negative except as stated above in HPI.  Physical Exam: Vital signs: Vitals:   08/09/24 0317 08/09/24 0800  BP: 132/71 (!) 140/72  Pulse: 91 85  Resp: 18 18  Temp: 97.8 F (36.6 C) (!) 97.5 F (36.4 C)  SpO2: 95% 98%   Last BM Date : 08/08/24 General:   Alert,  Well-developed, well-nourished, pleasant and cooperative in NAD Lungs: No visible respiratory distress Heart:  Regular rate and rhythm; no murmurs, clicks, rubs,  or gallops. Abdomen: Soft, nontender, nondistended, bowel sound present, no peritoneal signs Mood and affect normal Alert and oriented x 3 Rectal:  Deferred  GI:  Lab Results: Recent Labs    08/08/24 1645 08/08/24 2233 08/09/24 0456  WBC 6.7 7.6 7.5  HGB 13.7 12.6* 13.7  HCT 42.1 38.3* 40.7  PLT 246 258 265   BMET Recent Labs    08/08/24 1400 08/09/24 0456  NA 137 136  K 3.9 3.8  CL 102 105  CO2 23 23  GLUCOSE 133* 124*  BUN 15 13  CREATININE 1.29* 1.06  CALCIUM  9.2 8.7*   LFT Recent Labs    08/08/24 1400  PROT 6.8  ALBUMIN 3.2*  AST 19  ALT 14  ALKPHOS 66  BILITOT 1.1   PT/INR Recent Labs    08/08/24 1827  LABPROT 17.9*  INR 1.4*     Studies/Results: No results found.  Impression/Plan: -Rectal bleeding with normal hemoglobin.  Intermittent bleeding for last 1 month. -  Esophageal  dysphagia. - Heme positive stool - History of atrial fibrillation.  Last dose of Eliquis  yesterday morning. - History of diarrhea.  GI pathogen panel and C. difficile negative recently.  Recommendations --------------------------- - Patient and his family prefers inpatient endoscopic workup.  plan for EGD with dilation and colonoscopy tomorrow. - Continue to hold Eliquis  - Okay to have clear liquids diet today.  Start colonoscopy prep this afternoon.  Keep n.p.o. past midnight.  Risks (bleeding, infection, bowel perforation that could require surgery, sedation-related changes in cardiopulmonary systems), benefits (identification and possible treatment of source of symptoms, exclusion of certain causes of symptoms), and alternatives (watchful waiting, radiographic imaging studies, empiric medical treatment)  were explained to patient/family in detail and patient wishes to proceed.     LOS: 0 days   Layla Lah  MD, FACP 08/09/2024, 11:32 AM  Contact #  [phone]

## 2024-08-10 ENCOUNTER — Observation Stay (HOSPITAL_COMMUNITY)

## 2024-08-10 ENCOUNTER — Observation Stay (HOSPITAL_BASED_OUTPATIENT_CLINIC_OR_DEPARTMENT_OTHER)

## 2024-08-10 ENCOUNTER — Encounter (HOSPITAL_COMMUNITY)
Admission: EM | Disposition: A | Discharge status: Home / Self Care | Payer: Self-pay | Source: Home / Self Care | Attending: Emergency Medicine

## 2024-08-10 DIAGNOSIS — R1310 Dysphagia, unspecified: Secondary | ICD-10-CM

## 2024-08-10 DIAGNOSIS — K625 Hemorrhage of anus and rectum: Secondary | ICD-10-CM | POA: Diagnosis not present

## 2024-08-10 DIAGNOSIS — K3189 Other diseases of stomach and duodenum: Secondary | ICD-10-CM | POA: Diagnosis not present

## 2024-08-10 DIAGNOSIS — K648 Other hemorrhoids: Secondary | ICD-10-CM

## 2024-08-10 DIAGNOSIS — Z87891 Personal history of nicotine dependence: Secondary | ICD-10-CM

## 2024-08-10 DIAGNOSIS — K222 Esophageal obstruction: Secondary | ICD-10-CM | POA: Diagnosis not present

## 2024-08-10 DIAGNOSIS — K224 Dyskinesia of esophagus: Secondary | ICD-10-CM | POA: Diagnosis not present

## 2024-08-10 DIAGNOSIS — K2951 Unspecified chronic gastritis with bleeding: Secondary | ICD-10-CM | POA: Diagnosis not present

## 2024-08-10 DIAGNOSIS — I110 Hypertensive heart disease with heart failure: Secondary | ICD-10-CM | POA: Diagnosis not present

## 2024-08-10 DIAGNOSIS — K630 Abscess of intestine: Secondary | ICD-10-CM | POA: Diagnosis not present

## 2024-08-10 DIAGNOSIS — J449 Chronic obstructive pulmonary disease, unspecified: Secondary | ICD-10-CM

## 2024-08-10 DIAGNOSIS — K922 Gastrointestinal hemorrhage, unspecified: Secondary | ICD-10-CM | POA: Diagnosis not present

## 2024-08-10 DIAGNOSIS — I5033 Acute on chronic diastolic (congestive) heart failure: Secondary | ICD-10-CM | POA: Diagnosis not present

## 2024-08-10 DIAGNOSIS — K529 Noninfective gastroenteritis and colitis, unspecified: Secondary | ICD-10-CM | POA: Diagnosis not present

## 2024-08-10 HISTORY — PX: ESOPHAGOGASTRODUODENOSCOPY: SHX5428

## 2024-08-10 HISTORY — PX: COLONOSCOPY: SHX5424

## 2024-08-10 LAB — CBC
HCT: 43 % (ref 39.0–52.0)
Hemoglobin: 14.1 g/dL (ref 13.0–17.0)
MCH: 30 pg (ref 26.0–34.0)
MCHC: 32.8 g/dL (ref 30.0–36.0)
MCV: 91.5 fL (ref 80.0–100.0)
Platelets: 245 K/uL (ref 150–400)
RBC: 4.7 MIL/uL (ref 4.22–5.81)
RDW: 13.3 % (ref 11.5–15.5)
WBC: 7.5 K/uL (ref 4.0–10.5)
nRBC: 0 % (ref 0.0–0.2)

## 2024-08-10 LAB — BASIC METABOLIC PANEL WITH GFR
Anion gap: 9 (ref 5–15)
BUN: 10 mg/dL (ref 8–23)
CO2: 22 mmol/L (ref 22–32)
Calcium: 8.7 mg/dL — ABNORMAL LOW (ref 8.9–10.3)
Chloride: 104 mmol/L (ref 98–111)
Creatinine, Ser: 1.11 mg/dL (ref 0.61–1.24)
GFR, Estimated: >60 mL/min (ref >60)
Glucose, Bld: 124 mg/dL — ABNORMAL HIGH (ref 70–99)
Potassium: 3.6 mmol/L (ref 3.5–5.1)
Sodium: 135 mmol/L (ref 135–145)

## 2024-08-10 LAB — GLUCOSE, CAPILLARY
Glucose-Capillary: 121 mg/dL — ABNORMAL HIGH (ref 70–99)
Glucose-Capillary: 106 mg/dL — ABNORMAL HIGH (ref 70–99)
Glucose-Capillary: 216 mg/dL — ABNORMAL HIGH (ref 70–99)
Glucose-Capillary: 235 mg/dL — ABNORMAL HIGH (ref 70–99)

## 2024-08-10 LAB — HEPATITIS B SURFACE ANTIGEN: Hepatitis B Surface Ag: NONREACTIVE

## 2024-08-10 LAB — MAGNESIUM: Magnesium: 1.7 mg/dL (ref 1.7–2.4)

## 2024-08-10 SURGERY — EGD (ESOPHAGOGASTRODUODENOSCOPY)
Anesthesia: Monitor Anesthesia Care

## 2024-08-10 MED ORDER — PROPOFOL 10 MG/ML IV BOLUS
INTRAVENOUS | Status: DC | PRN
Start: 2024-08-10 — End: 2024-08-10
  Administered 2024-08-10 (×3): 50 mg via INTRAVENOUS
  Administered 2024-08-10: 30 mg via INTRAVENOUS
  Administered 2024-08-10: 70 mg via INTRAVENOUS
  Administered 2024-08-10: 30 mg via INTRAVENOUS

## 2024-08-10 MED ORDER — POTASSIUM CHLORIDE CRYS ER 20 MEQ PO TBCR
30.0000 meq | EXTENDED_RELEASE_TABLET | Freq: Once | ORAL | Status: AC
  Administered 2024-08-10: 30 meq via ORAL
  Filled 2024-08-10: qty 1

## 2024-08-10 MED ORDER — LIDOCAINE 2% (20 MG/ML) 5 ML SYRINGE
INTRAMUSCULAR | Status: DC | PRN
  Administered 2024-08-10: 100 mg via INTRAVENOUS

## 2024-08-10 MED ORDER — PHENYLEPHRINE 80 MCG/ML (10ML) SYRINGE FOR IV PUSH (FOR BLOOD PRESSURE SUPPORT)
PREFILLED_SYRINGE | INTRAVENOUS | Status: DC | PRN
  Administered 2024-08-10 (×3): 160 ug via INTRAVENOUS

## 2024-08-10 MED ORDER — MAGNESIUM SULFATE 2 GM/50ML IV SOLN
2.0000 g | Freq: Once | INTRAVENOUS | Status: AC
  Administered 2024-08-10: 2 g via INTRAVENOUS
  Filled 2024-08-10: qty 50

## 2024-08-10 MED ORDER — METHYLPREDNISOLONE SODIUM SUCC 125 MG IJ SOLR
40.0000 mg | Freq: Two times a day (BID) | INTRAMUSCULAR | Status: DC
  Administered 2024-08-10 – 2024-08-11 (×3): 40 mg via INTRAVENOUS
  Filled 2024-08-10 (×3): qty 2

## 2024-08-10 NOTE — Transfer of Care (Signed)
 Immediate Anesthesia Transfer of Care Note  Patient: Douglas Hurst.  Procedure(s) Performed: EGD (ESOPHAGOGASTRODUODENOSCOPY) with possible dilation COLONOSCOPY  Patient Location: Endoscopy Unit  Anesthesia Type:MAC  Level of Consciousness: awake, alert , and oriented  Airway & Oxygen Therapy: Patient Spontanous Breathing and Patient connected to nasal cannula oxygen  Post-op Assessment: Report given to RN and Post -op Vital signs reviewed and stable  Post vital signs: Reviewed and stable  Last Vitals:  Vitals Value Taken Time  BP 141/90 08/10/24 09:44  Temp 36.4 C 08/10/24 09:44  Pulse 101 08/10/24 09:44  Resp 17 08/10/24 09:44  SpO2    Vitals shown include unfiled device data.  Last Pain:  Vitals:   08/10/24 0944  TempSrc: Temporal  PainSc:          Complications: No notable events documented.

## 2024-08-10 NOTE — Anesthesia Preprocedure Evaluation (Signed)
 Anesthesia Evaluation  Patient identified by MRN, date of birth, ID band Patient awake    Reviewed: Allergy & Precautions, NPO status , Patient's Chart, lab work & pertinent test results  Airway Mallampati: II  TM Distance: >3 FB Neck ROM: Full    Dental   Pulmonary COPD, former smoker   breath sounds clear to auscultation       Cardiovascular hypertension, Pt. on medications +CHF  + dysrhythmias  Rhythm:Regular Rate:Normal     Neuro/Psych  Neuromuscular disease    GI/Hepatic Neg liver ROS, hiatal hernia,GERD  ,,  Endo/Other  diabetes, Type 2    Renal/GU negative Renal ROS     Musculoskeletal   Abdominal   Peds  Hematology  (+) Blood dyscrasia, anemia   Anesthesia Other Findings   Reproductive/Obstetrics                              Anesthesia Physical Anesthesia Plan  ASA: 3  Anesthesia Plan: MAC   Post-op Pain Management:    Induction:   PONV Risk Score and Plan: 1 and Propofol  infusion  Airway Management Planned: Natural Airway and Nasal Cannula  Additional Equipment:   Intra-op Plan:   Post-operative Plan:   Informed Consent: I have reviewed the patients History and Physical, chart, labs and discussed the procedure including the risks, benefits and alternatives for the proposed anesthesia with the patient or authorized representative who has indicated his/her understanding and acceptance.       Plan Discussed with: CRNA  Anesthesia Plan Comments:         Anesthesia Quick Evaluation

## 2024-08-10 NOTE — Plan of Care (Signed)

## 2024-08-10 NOTE — Evaluation (Signed)
 Occupational Therapy Evaluation Patient Details Name: Douglas Hurst. MRN: [id number] DOB: 03-13-1941 Today's Date: 08/10/2024   History of Present Illness   Pt is an 83 y.o. male who presented 08/08/24 with increasing bright red blood in his stool intermittently for the past month. He saw GI outpatient and was scheduled to get a colonoscopy in September. Reportedly he was checked for C. difficile and GI pathogen panel outpatient and they were negative. PMH includes: bipolar d/o, DMII, HTN, HLD, GERD, COPD, PAF on Eliquis      Clinical Impressions This 83 yo male admitted with above presents to acute OT with PLOF of being independent to Mod I with ambulation with/without QC and needing intermittent A for ADLs. He currently is CGA with ambulation with QC and setup-min A for basic ADLs. He will continue to benefit from acute OT with follow up HHOT.     If plan is discharge home, recommend the following:   A little help with walking and/or transfers;A little help with bathing/dressing/bathroom;Assistance with cooking/housework;Assist for transportation;Help with stairs or ramp for entrance     Functional Status Assessment   Patient has had a recent decline in their functional status and demonstrates the ability to make significant improvements in function in a reasonable and predictable amount of time.     Equipment Recommendations   None recommended by OT      Precautions/Restrictions   Precautions Precautions: Fall Recall of Precautions/Restrictions: Intact Restrictions Weight Bearing Restrictions Per Provider Order: No     Mobility Bed Mobility Overal bed mobility: Needs Assistance Bed Mobility: Supine to Sit, Sit to Supine     Supine to sit: Supervision Sit to supine: Supervision        Transfers Overall transfer level: Needs assistance Equipment used: Quad cane Transfers: Sit to/from Stand Sit to Stand: Contact guard assist                   Balance Overall balance assessment: Needs assistance Sitting-balance support: No upper extremity supported, Feet supported Sitting balance-Leahy Scale: Good     Standing balance support: No upper extremity supported, During functional activity Standing balance-Leahy Scale: Fair Standing balance comment: standing at sink to wash hands                           ADL either performed or assessed with clinical judgement   ADL Overall ADL's : Needs assistance/impaired Eating/Feeding: Independent;Sitting   Grooming: Contact guard assist;Standing;Wash/dry hands   Upper Body Bathing: Set up;Sitting   Lower Body Bathing: Contact guard assist;Sit to/from stand   Upper Body Dressing : Set up;Sitting   Lower Body Dressing: Minimal assistance Lower Body Dressing Details (indicate cue type and reason): CGA sit<>stand Toilet Transfer: Contact guard assist;Ambulation Toilet Transfer Details (indicate cue type and reason): QC, standing at toilet to urinate Toileting- Clothing Manipulation and Hygiene: Contact guard assist               Vision Patient Visual Report: No change from baseline              Pertinent Vitals/Pain Pain Assessment Pain Assessment: Faces Faces Pain Scale: No hurt     Extremity/Trunk Assessment Upper Extremity Assessment Upper Extremity Assessment: Overall WFL for tasks assessed           Communication Communication Communication: Impaired Factors Affecting Communication: Hearing impaired   Cognition Arousal: Alert Behavior During Therapy: [facility] for tasks assessed/performed  Following commands: Intact       Cueing   Cueing Techniques: Verbal cues              Home Living Family/patient expects to be discharged to:: Private residence Living Arrangements: Spouse/significant other Available Help at Discharge: Family;Available 24 hours/day Type of Home: House Home Access: Level  entry     Home Layout: Two level;Able to live on main level with bedroom/bathroom Alternate Level Stairs-Number of Steps: does not go upstairs   Bathroom Shower/Tub: Producer, television/film/video: Handicapped height     Home Equipment: Agricultural consultant (2 wheels);Cane - quad;BSC/3in1;Shower seat;Grab bars - tub/shower          Prior Functioning/Environment Prior Level of Function : Independent/Modified Independent             Mobility Comments: Uses QC the majority of the time, but intermittently no AD; denies any recent falls ADLs Comments: reports his wife sometimes helps him and sometimes he does it by himself    OT Problem List: Impaired balance (sitting and/or standing)   OT Treatment/Interventions: Self-care/ADL training;DME and/or AE instruction;Balance training;Patient/family education      OT Goals(Current goals can be found in the care plan section)   Acute Rehab OT Goals Patient Stated Goal: to go home soon, for bed alarm to not be set (but it was) OT Goal Formulation: With patient Time For Goal Achievement: 08/24/24 Potential to Achieve Goals: Good   OT Frequency:  Min 2X/week       AM-PAC OT 6 Clicks Daily Activity     Outcome Measure Help from another person eating meals?: None Help from another person taking care of personal grooming?: A Little Help from another person toileting, which includes using toliet, bedpan, or urinal?: A Little Help from another person bathing (including washing, rinsing, drying)?: A Little   Help from another person to put on and taking off regular lower body clothing?: A Little 6 Click Score: 16   End of Session Equipment Utilized During Treatment: Gait belt (QC)  Activity Tolerance:  (increased WOB, sats 96% on RA upon return to room from ambulating in hallway) Patient left: in bed;with call bell/phone within reach;with bed alarm set  OT Visit Diagnosis: Unsteadiness on feet (R26.81);Other abnormalities of  gait and mobility (R26.89)                Time: 8572-8556 OT Time Calculation (min): 16 min Charges:  OT General Charges $OT Visit: 1 Visit OT Evaluation $OT Eval Moderate Complexity: 1 Mod  Cathy L. OT Acute Rehabilitation Services Office [phone]    Rodgers Dorothyann Distel 08/10/2024, 3:53 PM

## 2024-08-10 NOTE — Progress Notes (Signed)
 PROGRESS NOTE  Douglas Hurst Douglas Hurst. FMW:[id number] DOB: [birth year] DOA: 08/08/2024 PCP: Charlott Dorn LABOR, MD   LOS: 0 days   Brief Narrative / Interim history: 83 year old male with DM2, COPD, HTN, PAF on Eliquis  comes into the hospital with bright red blood in his stool, has been on and off going on for the past month.  He saw GI as an outpatient and was scheduled to get a colonoscopy in September.  He does not have any abdominal pain, nausea or vomiting.  He also has been having significant diarrhea, was checked for C. difficile and GI pathogen panel as an outpatient and apparently they were negative.  He has had increased bleeding and decided to come to the hospital.  GI consulted  Subjective / 24h Interval events: He is finishing prep this morning, doing well.  Ready for colonoscopy  Assesement and Plan: Principal Problem:   Lower GI bleed Active Problems:   Diarrhea   Paroxysmal atrial fibrillation (HCC)   Gastroesophageal reflux disease   Essential hypertension   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HCC)   Chronic obstructive pulmonary disease (HCC)   Abdominal aortic aneurysm (HCC)   Bipolar disorder (HCC)   Mixed hyperlipidemia   Principal problem Lower GI bleed due to new diagnosis of UC-somewhat subacute, has been going on for the past month.  GI consulted, plans for endoscopic evaluation today.  Eliquis  is on hold - Underwent an EGD this morning which showed a nonobstructing Schatzki ring status post dilatation - Underwent a colonoscopy this morning which showed severe pancolitis concerning for ulcerative colitis, new since last colonoscopy.  This was biopsied.  He will be placed on high-dose IV steroids for what looks like UC flareup with GI bleed  Active problems PAF-currently in sinus, he does not appear to be on nodal agents.  Continue to hold Eliquis , resume per GI  Essential hypertension-continue spironolactone  and Lasix   MWF  Diarrhea-due to ulcerative colitis  Chronic obstructive pulmonary disease -no wheezing, this is stable  Abdominal aortic aneurysm - Followed by vascular surgery with regular monitoring    Bipolar disorder - Mood stable, on no medications    Mixed hyperlipidemia - Continue pravachol    Type 2 diabetes mellitus, with hyperglycemia-placed on sliding scale, CBGs acceptable  CBG (last 3)  Recent Labs    08/09/24 2107 08/10/24 0600 08/10/24 1101  GLUCAP 120* 121* 106*    Scheduled Meds:  donepezil   5 mg Oral QHS   finasteride   5 mg Oral Daily   furosemide   20 mg Oral Q M,W,F   insulin  aspart  0-9 Units Subcutaneous TID WC   methylPREDNISolone  (SOLU-MEDROL ) injection  40 mg Intravenous Q12H   multivitamin with minerals  1 tablet Oral Daily   pantoprazole  (PROTONIX ) IV  40 mg Intravenous Q24H   pravastatin   40 mg Oral QPM   spironolactone   12.5 mg Oral Daily   tamsulosin   0.4 mg Oral QPC supper   Continuous Infusions:  sodium chloride  Stopped (08/10/24 0938)   sodium chloride      PRN Meds:.acetaminophen  **OR** acetaminophen , ipratropium, ondansetron  **OR** ondansetron  (ZOFRAN ) IV  Current Outpatient Medications  Medication Instructions   acetaminophen  (TYLENOL ) 650 mg, Oral, Every 6 hours PRN   donepezil  (ARICEPT ) 10 MG tablet TAKE 1 TABLET BY MOUTH EVERYDAY AT BEDTIME   donepezil  (ARICEPT ) 5 mg, Oral, Daily at bedtime   Eliquis  5 mg, Oral, 2 times daily   finasteride  (PROSCAR ) 5 mg, Daily   furosemide  (LASIX ) 20 mg, Oral,  Daily   Multiple Vitamin (MULTIVITAMIN WITH MINERALS) TABS tablet 1 tablet, Daily   pantoprazole  (PROTONIX ) 40 mg, Daily   polyethylene glycol powder (GLYCOLAX /MIRALAX ) 17 g, Oral, Daily   pravastatin  (PRAVACHOL ) 40 mg, Oral, Every evening   SPIRIVA  RESPIMAT 2.5 MCG/ACT AERS 2 puffs, Daily PRN   spironolactone  (ALDACTONE ) 25 mg, Oral, Daily   tadalafil (CIALIS) 20 mg, Oral, Every 3 DAYS PRN   tamsulosin  (FLOMAX ) 0.4 mg, Daily after supper     Diet Orders (From admission, onward)     Start     Ordered   08/10/24 0948  DIET SOFT Fluid consistency: Thin  Diet effective now       Question:  Fluid consistency:  Answer:  Thin   08/10/24 0948            DVT prophylaxis: SCDs Start: 08/08/24 1803   Lab Results  Component Value Date   PLT 245 08/10/2024      Code Status: Full Code  Family Communication: Wife at bedside  Status is: Observation The patient will require care spanning > 2 midnights and should be moved to inpatient because: IV steroids for ulcerative colitis flareup   Level of care: Progressive  Consultants:  Gastroenterology  Objective: Vitals:   08/10/24 0944 08/10/24 0950 08/10/24 1000 08/10/24 1046  BP: (!) 141/90 132/75 117/78 134/76  Pulse: (!) 101 99 96 87  Resp: 18 (!) 27 (!) 23   Temp: (!) 97.5 F (36.4 C)   97.8 F (36.6 C)  TempSrc: Temporal   Oral  SpO2: 99% 93% 91% 96%  Weight:      Height:        Intake/Output Summary (Last 24 hours) at 08/10/2024 1149 Last data filed at 08/10/2024 9061 Gross per 24 hour  Intake 400 ml  Output 150 ml  Net 250 ml   Wt Readings from Last 3 Encounters:  08/10/24 85.5 kg  07/08/24 89.5 kg  04/19/24 93 kg    Examination:  Constitutional: NAD Eyes: lids and conjunctivae normal, no scleral icterus ENMT: mmm Neck: normal, supple Respiratory: clear to auscultation bilaterally, no wheezing, no crackles.  Cardiovascular: Regular rate and rhythm, no murmurs / rubs / gallops.  Abdomen: soft, no distention, no tenderness. Bowel sounds positive.   Data Reviewed: I have independently reviewed following labs and imaging studies   CBC Recent Labs  Lab 08/08/24 1400 08/08/24 1645 08/08/24 2233 08/09/24 0456 08/10/24 0243  WBC 7.6 6.7 7.6 7.5 7.5  HGB 14.1 13.7 12.6* 13.7 14.1  HCT 43.2 42.1 38.3* 40.7 43.0  PLT 270 246 258 265 245  MCV 93.1 94.0 91.8 90.6 91.5  MCH 30.4 30.6 30.2 30.5 30.0  MCHC 32.6 32.5 32.9 33.7 32.8  RDW 13.4  13.6 13.2 13.3 13.3    Recent Labs  Lab 08/08/24 1400 08/08/24 1645 08/08/24 1827 08/09/24 0456 08/10/24 0243  NA 137  --   --  136 135  K 3.9  --   --  3.8 3.6  CL 102  --   --  105 104  CO2 23  --   --  23 22  GLUCOSE 133*  --   --  124* 124*  BUN 15  --   --  13 10  CREATININE 1.29*  --   --  1.06 1.11  CALCIUM  9.2  --   --  8.7* 8.7*  AST 19  --   --   --   --   ALT 14  --   --   --   --  ALKPHOS 66  --   --   --   --   BILITOT 1.1  --   --   --   --   ALBUMIN 3.2*  --   --   --   --   MG  --   --   --   --  1.7  CRP  --   --  1.2*  --   --   INR  --   --  1.4*  --   --   TSH  --   --  0.544  --   --   HGBA1C  --  7.0*  --   --   --     ------------------------------------------------------------------------------------------------------------------ No results for input(s): CHOL, HDL, LDLCALC, TRIG, CHOLHDL, LDLDIRECT in the last 72 hours.  Lab Results  Component Value Date   HGBA1C 7.0 (H) 08/08/2024   ------------------------------------------------------------------------------------------------------------------ Recent Labs    08/08/24 1827  TSH 0.544    Cardiac Enzymes No results for input(s): CKMB, TROPONINI, MYOGLOBIN in the last 168 hours.  Invalid input(s): CK ------------------------------------------------------------------------------------------------------------------    Component Value Date/Time   BNP 56.5 08/06/2023 1005    CBG: Recent Labs  Lab 08/09/24 0604 08/09/24 1044 08/09/24 2107 08/10/24 0600 08/10/24 1101  GLUCAP 120* 124* 120* 121* 106*    No results found for this or any previous visit (from the past 240 hours).   Radiology Studies: No results found.   Nilda Fendt, MD, PhD [facility]  Between 7 am - 7 pm I am available, please contact me via Amion (for emergencies) or Securechat (non urgent messages)  Between 7 pm - 7 am I am not available, please contact night coverage MD/APP  via Amion

## 2024-08-10 NOTE — Progress Notes (Signed)
 OT Cancellation Note  Patient Details Name: Douglas Hurst. MRN: [id number] DOB: 01-22-1941   Cancelled Treatment:    Reason Eval/Treat Not Completed: Patient at procedure or test/ unavailable. Pt out of room for EGD and colonoscopy.  Donny BECKER OT Acute Rehabilitation Services Office [phone]    Rodgers Dorothyann Distel 08/10/2024, 9:13 AM

## 2024-08-10 NOTE — Brief Op Note (Signed)
 08/08/2024 - 08/10/2024  9:45 AM  PATIENT:  Douglas Hurst.  83 y.o. male  PRE-OPERATIVE DIAGNOSIS:  Esophageal dysphagia, heme positive stool, rectal bleeding  POST-OPERATIVE DIAGNOSIS:  EGD: hiatal hernia; lower esophageal ring, gastritis; dilated esophagus  Colon: colitis  PROCEDURE:  Procedure(s): EGD (ESOPHAGOGASTRODUODENOSCOPY) with possible dilation (N/A) COLONOSCOPY (N/A)  SURGEON:  Surgeons and Role:    * Ludie Hudon, MD - Primary  Findings ------------ - EGD showed lower esophageal ring which was dilated.  Also showed gastritis.  Biopsies taken. - Colonoscopy showed severe ulcerative colitis with contact oozing and ulcerations.  Biopsies taken.  Normal TI.  Recommendations ------------------------- - Start IV Solu-Medrol  - Check HBs antigen and QuantiFERON-TB gold - Patient would likely need biological agent for his severe ulcerative colitis as an outpatient - Okay to have soft diet - Hopefully discharge home tomorrow on oral taper steroids. - GI will follow  Layla Lah MD, FACP 08/10/2024, 9:47 AM  Contact #  [phone]

## 2024-08-10 NOTE — Anesthesia Postprocedure Evaluation (Signed)
 Anesthesia Post Note  Patient: Geran Haithcock.  Procedure(s) Performed: EGD (ESOPHAGOGASTRODUODENOSCOPY) with possible dilation COLONOSCOPY     Patient location during evaluation: PACU Anesthesia Type: MAC Level of consciousness: awake and alert Pain management: pain level controlled Vital Signs Assessment: post-procedure vital signs reviewed and stable Respiratory status: spontaneous breathing, nonlabored ventilation, respiratory function stable and patient connected to nasal cannula oxygen Cardiovascular status: stable and blood pressure returned to baseline Postop Assessment: no apparent nausea or vomiting Anesthetic complications: no   No notable events documented.  Last Vitals:  Vitals:   08/10/24 1000 08/10/24 1046  BP: 117/78 134/76  Pulse: 96 87  Resp: (!) 23   Temp:  36.6 C  SpO2: 91% 96%    Last Pain:  Vitals:   08/10/24 1505  TempSrc:   PainSc: 0-No pain                 Epifanio Lamar BRAVO

## 2024-08-10 NOTE — Op Note (Signed)
 [facility] Patient Name: Douglas Hurst Procedure Date : 08/10/2024 MRN: [id number] Attending MD: Layla Lah , MD, [id number] Date of Birth: Jan 02, 1941 CSN: [id number] Age: 83 Admit Type: Inpatient Procedure:                Upper GI endoscopy Indications:              Dysphagia Providers:                Layla Lah, MD, Heather Ng, RN, Corene Southgate, Technician Referring MD:              Medicines:                Sedation Administered by an Anesthesia Professional Complications:            No immediate complications. Estimated Blood Loss:     Estimated blood loss was minimal. Procedure:                Pre-Anesthesia Assessment:                           - Prior to the procedure, a History and Physical                            was performed, and patient medications and                            allergies were reviewed. The patient's tolerance of                            previous anesthesia was also reviewed. The risks                            and benefits of the procedure and the sedation                            options and risks were discussed with the patient.                            All questions were answered, and informed consent                            was obtained. Prior Anticoagulants: The patient has                            taken Eliquis  (apixaban ), last dose was 2 days                            prior to procedure. ASA Grade Assessment: III - A                            patient with severe systemic disease. After  reviewing the risks and benefits, the patient was                            deemed in satisfactory condition to undergo the                            procedure.                           After obtaining informed consent, the endoscope was                            passed under direct vision. Throughout the                            procedure, the patient's blood  pressure, pulse, and                            oxygen saturations were monitored continuously. The                            [id number]-[id number] ([id number]) Olympus endoscope was introduced                            through the mouth, and advanced to the second part                            of duodenum. The upper GI endoscopy was                            accomplished without difficulty. The patient                            tolerated the procedure well. Scope In: Scope Out: Findings:      Abnormal motility was noted in the esophagus.      A non-obstructing Schatzki ring was found at the gastroesophageal       junction. A TTS dilator was passed through the scope. Dilation with an       18-19-20 mm balloon dilator was performed to 18 mm. The dilation site       was examined following endoscope reinsertion and showed mild mucosal       disruption, moderate improvement in luminal narrowing and no perforation.      A small hiatal hernia was present.      Diffuse mild inflammation characterized by congestion (edema) and       erythema was found in the entire examined stomach. Biopsies were taken       with a cold forceps for histology.      The cardia and gastric fundus were normal on retroflexion.      The duodenal bulb and first portion of the duodenum were normal.      A large non-bleeding diverticulum was found in the second portion of the       duodenum. Impression:               - Abnormal esophageal motility.                           -  Non-obstructing Schatzki ring.                           - Small hiatal hernia.                           - Gastritis. Biopsied.                           - Normal duodenal bulb and first portion of the                            duodenum.                           - Non-bleeding duodenal diverticulum. Recommendation:           - Perform a colonoscopy today. Procedure Code(s):        --- Professional ---                           [id number],  Esophagogastroduodenoscopy, flexible,                            transoral; with biopsy, single or multiple Diagnosis Code(s):        --- Professional ---                           K22.4, Dyskinesia of esophagus                           K22.2, Esophageal obstruction                           K44.9, Diaphragmatic hernia without obstruction or                            gangrene                           K29.70, Gastritis, unspecified, without bleeding                           R13.10, Dysphagia, unspecified                           K57.10, Diverticulosis of small intestine without                            perforation or abscess without bleeding CPT copyright 2022 [employer]. All rights reserved. The codes documented in this report are preliminary and upon coder review may  be revised to meet current compliance requirements. Layla Lah, MD Layla Lah, MD 08/10/2024 9:49:07 AM Number of Addenda: 0

## 2024-08-10 NOTE — Interval H&P Note (Signed)
 History and Physical Interval Note:  08/10/2024 8:37 AM  Douglas Hurst [name].  has presented today for surgery, with the diagnosis of Esophageal dysphagia, heme positive stool, rectal bleeding.  The various methods of treatment have been discussed with the patient and family. After consideration of risks, benefits and other options for treatment, the patient has consented to  Procedure(s): EGD (ESOPHAGOGASTRODUODENOSCOPY) with possible dilation (N/A) COLONOSCOPY (N/A) as a surgical intervention.  The patient's history has been reviewed, patient examined, no change in status, stable for surgery.  I have reviewed the patient's chart and labs.  Questions were answered to the patient's satisfaction.     [name]

## 2024-08-10 NOTE — Op Note (Signed)
 [facility] Patient Name: Douglas Hurst Procedure Date : 08/10/2024 MRN: [id number] Attending MD: Layla Lah , MD, [id number] Date of Birth: 06-Feb-1941 CSN: [id number] Age: 83 Admit Type: Inpatient Procedure:                Colonoscopy Indications:              Rectal bleeding Providers:                Layla Lah, MD, Heather Ng, RN, Corene Southgate, Technician Referring MD:              Medicines:                Sedation Administered by an Anesthesia Professional Complications:            No immediate complications. Estimated Blood Loss:     Estimated blood loss was minimal. Procedure:                Pre-Anesthesia Assessment:                           - Prior to the procedure, a History and Physical                            was performed, and patient medications and                            allergies were reviewed. The patient's tolerance of                            previous anesthesia was also reviewed. The risks                            and benefits of the procedure and the sedation                            options and risks were discussed with the patient.                            All questions were answered, and informed consent                            was obtained. Prior Anticoagulants: The patient has                            taken Eliquis  (apixaban ), last dose was 2 days                            prior to procedure. ASA Grade Assessment: III - A                            patient with severe systemic disease. After  reviewing the risks and benefits, the patient was                            deemed in satisfactory condition to undergo the                            procedure.                           - Prior to the procedure, a History and Physical                            was performed, and patient medications and                            allergies were reviewed. The  patient's tolerance of                            previous anesthesia was also reviewed. The risks                            and benefits of the procedure and the sedation                            options and risks were discussed with the patient.                            All questions were answered, and informed consent                            was obtained. Prior Anticoagulants: The patient has                            taken Eliquis  (apixaban ), last dose was 2 days                            prior to procedure. ASA Grade Assessment: III - A                            patient with severe systemic disease. After                            reviewing the risks and benefits, the patient was                            deemed in satisfactory condition to undergo the                            procedure.                           After obtaining informed consent, the colonoscope  was passed under direct vision. Throughout the                            procedure, the patient's blood pressure, pulse, and                            oxygen saturations were monitored continuously. The                            PCF-HQ190L ([id number]) Olympus colonoscope was                            introduced through the anus and advanced to the the                            terminal ileum, with identification of the                            appendiceal orifice and IC valve. The colonoscopy                            was performed without difficulty. The patient                            tolerated the procedure well. The quality of the                            bowel preparation was fair. The terminal ileum,                            ileocecal valve, appendiceal orifice, and rectum                            were photographed. Scope In: 9:24:15 AM Scope Out: 9:35:58 AM Scope Withdrawal Time: 0 hours 6 minutes 33 seconds  Total Procedure Duration: 0 hours 11 minutes 43  seconds  Findings:      Hemorrhoids were found on perianal exam.      Inflammation was found in a continuous and circumferential pattern from       the rectum to the cecum. This was graded as Mayo Score 3 (severe, with       spontaneous bleeding, ulcerations), and when compared to the previous       examination, the findings are new. Biopsies were taken with a cold       forceps for histology.      Internal hemorrhoids were found during retroflexion. The hemorrhoids       were small. Impression:               - Preparation of the colon was fair.                           - Hemorrhoids found on perianal exam.                           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 Biopsied.                           - Internal hemorrhoids. Recommendation:           - Return patient to hospital ward for ongoing care.                           - Soft diet.                           - Continue present medications.                           - Await pathology results.                           - Repeat colonoscopy date to be determined after                            pending pathology results are reviewed for                            surveillance. Procedure Code(s):        --- Professional ---                           [phone], Colonoscopy, flexible; with biopsy, single                            or multiple Diagnosis Code(s):        --- Professional ---                           K64.8, Other hemorrhoids                           K51.011, Ulcerative (chronic) pancolitis with                            rectal bleeding                           K62.5, Hemorrhage of anus and rectum CPT copyright 2022 [employer] Medical Association. All rights reserved. The codes documented in this report are preliminary and upon coder review may  be revised to meet current compliance requirements. Layla Lah, MD Layla Lah,  MD 08/10/2024 9:52:42 AM Number of Addenda: 0

## 2024-08-11 DIAGNOSIS — K625 Hemorrhage of anus and rectum: Secondary | ICD-10-CM | POA: Diagnosis not present

## 2024-08-11 DIAGNOSIS — K5190 Ulcerative colitis, unspecified, without complications: Secondary | ICD-10-CM | POA: Diagnosis not present

## 2024-08-11 DIAGNOSIS — R195 Other fecal abnormalities: Secondary | ICD-10-CM | POA: Diagnosis not present

## 2024-08-11 DIAGNOSIS — K922 Gastrointestinal hemorrhage, unspecified: Secondary | ICD-10-CM | POA: Diagnosis not present

## 2024-08-11 DIAGNOSIS — R1310 Dysphagia, unspecified: Secondary | ICD-10-CM | POA: Diagnosis not present

## 2024-08-11 LAB — GLUCOSE, CAPILLARY
Glucose-Capillary: 190 mg/dL — ABNORMAL HIGH (ref 70–99)
Glucose-Capillary: 161 mg/dL — ABNORMAL HIGH (ref 70–99)

## 2024-08-11 LAB — CBC
HCT: 41.6 % (ref 39.0–52.0)
Hemoglobin: 13.8 g/dL (ref 13.0–17.0)
MCH: 30.2 pg (ref 26.0–34.0)
MCHC: 33.2 g/dL (ref 30.0–36.0)
MCV: 91 fL (ref 80.0–100.0)
Platelets: 276 K/uL (ref 150–400)
RBC: 4.57 MIL/uL (ref 4.22–5.81)
RDW: 13.2 % (ref 11.5–15.5)
WBC: 8.4 K/uL (ref 4.0–10.5)
nRBC: 0 % (ref 0.0–0.2)

## 2024-08-11 MED ORDER — PREDNISONE 10 MG PO TABS: ORAL_TABLET | ORAL | 0 refills | Status: AC

## 2024-08-11 NOTE — Discharge Summary (Signed)
 Physician Discharge Summary  Douglas Hurst. FMW:[id number] DOB: Jun 18, 1941 DOA: 08/08/2024  PCP: Charlott Dorn LABOR, MD  Admit date: 08/08/2024 Discharge date: 08/11/2024  Admitted From: home Disposition:  home  Recommendations for Outpatient Follow-up:  Follow up with PCP in 1-2 weeks Please obtain BMP/CBC in one week Please follow up with GI in 4 weeks  Home Health: none Equipment/Devices: none  Discharge Condition: stable CODE STATUS: Full code Diet Orders (From admission, onward)     Start     Ordered   08/10/24 0948  DIET SOFT Fluid consistency: Thin  Diet effective now       Question:  Fluid consistency:  Answer:  Thin   08/10/24 0948            Brief Narrative / Interim history: 83 year old male with DM2, COPD, HTN, PAF on Eliquis  comes into the hospital with bright red blood in his stool, has been on and off going on for the past month.  He saw GI as an outpatient and was scheduled to get a colonoscopy in September.  He does not have any abdominal pain, nausea or vomiting.  He also has been having significant diarrhea, was checked for C. difficile and GI pathogen panel as an outpatient and apparently they were negative.  He has had increased bleeding and decided to come to the hospital.  GI consulted  Hospital Course / Discharge diagnoses: Principal problem Lower GI bleed due to new diagnosis of UC-somewhat subacute, has been going on for the past month.  GI consulted, underwent an EGD which showed nonobstructive Schatzki ring status post dilatation, also colonoscopy on 8/14 which showed severe pancolitis concerning for ulcerative colitis, new since last colonoscopy.  This was biopsied.  He was placed on steroids per GI, improved, and will be discharged home in stable condition on a slow steroid taper.  Active problems PAF-currently in sinus, he does not appear to be on nodal agents.  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  He is likely to be slightly more hyperglycemic with the steroids, but he will taper off in the next few weeks  Sepsis ruled out   Discharge Instructions   Allergies as of 08/11/2024   No Known Allergies      Medication List     PAUSE taking these medications    Eliquis  5 MG Tabs tablet Wait to take this until: August 12, 2024 Generic drug: apixaban  TAKE 1 TABLET BY MOUTH TWICE A DAY       TAKE these medications    acetaminophen  325 MG tablet Commonly known as: TYLENOL  Take 2 tablets (650 mg total) by mouth every 6 (six) hours as needed for mild pain (or Fever >/= 101).   donepezil  5 MG tablet Commonly known as: ARICEPT  Take 5 mg by mouth at bedtime. What changed: Another medication with the same name was removed. Continue taking this medication, and follow the directions you see here.   finasteride  5 MG tablet Commonly known as: PROSCAR  Take 5 mg by mouth daily.   furosemide  20 MG tablet Commonly known as: LASIX  Take 1 tablet (20 mg total)  by mouth daily.   multivitamin with minerals Tabs tablet Take 1 tablet by mouth daily.   pantoprazole  40 MG tablet Commonly known as: PROTONIX  Take 40 mg by mouth daily.   polyethylene glycol powder 17 GM/SCOOP powder Commonly known as: GLYCOLAX /MIRALAX  Take 17 g by mouth daily. What changed:  when to take this reasons to take this   pravastatin  40 MG tablet Commonly known as: PRAVACHOL  Take 1 tablet (40 mg total) by mouth every evening.   predniSONE  10 MG tablet Commonly known as: DELTASONE  Take 4 tablets (40 mg  total) by mouth daily for 7 days, THEN 3 tablets (30 mg total) daily for 7 days, THEN 2 tablets (20 mg total) daily for 7 days, THEN 1 tablet (10 mg total) daily for 7 days. Start taking on: August 11, 2024   Spiriva  Respimat 2.5 MCG/ACT Aers Generic drug: Tiotropium Bromide  Monohydrate Inhale 2 puffs into the lungs daily as needed (COPD).   spironolactone  25 MG tablet Commonly known as: ALDACTONE  TAKE 1 TABLET (25 MG TOTAL) BY MOUTH DAILY. What changed: how much to take   tadalafil 20 MG tablet Commonly known as: CIALIS Take 20 mg by mouth every three (3) days as needed for erectile dysfunction.   tamsulosin  0.4 MG Caps capsule Commonly known as: FLOMAX  Take 0.4 mg by mouth daily after supper.        Follow-up Information     [facility] Follow up.   Specialty: Home Health Services Why: Agency will call you to set up apt times. Contact information: [address] [place] [address] [phone]                 Consultations: GI  Procedures/Studies:  No results found.   Subjective: - no chest pain, shortness of breath, no abdominal pain, nausea or vomiting.   Discharge Exam: BP (!) 145/81 (BP Location: Left Arm)   Pulse 93   Temp 97.8 F (36.6 C) (Oral)   Resp 18   Ht 6' 1 (1.854 m)   Wt 85.6 kg   SpO2 96%   BMI 24.90 kg/m   General: Pt is alert, awake, not in acute distress Cardiovascular: RRR, S1/S2 +, no rubs, no gallops Respiratory: CTA bilaterally, no wheezing, no rhonchi Abdominal: Soft, NT, ND, bowel sounds + Extremities: no edema, no cyanosis    The results of significant diagnostics from this hospitalization (including imaging, microbiology, ancillary and laboratory) are listed below for reference.     Microbiology: No results found for this or any previous visit (from the past 240 hours).   Labs: Basic Metabolic Panel: Recent Labs  Lab 08/08/24 1400 08/09/24 0456 08/10/24 0243  NA 137 136 135   K 3.9 3.8 3.6  CL 102 105 104  CO2 23 23 22   GLUCOSE 133* 124* 124*  BUN 15 13 10   CREATININE 1.29* 1.06 1.11  CALCIUM  9.2 8.7* 8.7*  MG  --   --  1.7   Liver Function Tests: Recent Labs  Lab 08/08/24 1400  AST 19  ALT 14  ALKPHOS 66  BILITOT 1.1  PROT 6.8  ALBUMIN 3.2*   CBC: Recent Labs  Lab 08/08/24 1645 08/08/24 2233 08/09/24 0456 08/10/24 0243 08/11/24 0303  WBC 6.7 7.6 7.5 7.5 8.4  HGB 13.7 12.6* 13.7 14.1 13.8  HCT 42.1 38.3* 40.7 43.0 41.6  MCV 94.0 91.8 90.6 91.5 91.0  PLT 246 258 265 245 276   CBG: Recent Labs  Lab 08/10/24 0600 08/10/24  1101 08/10/24 1602 08/10/24 2054 08/11/24 0553  GLUCAP 121* 106* 216* 235* 190*   Hgb A1c Recent Labs    08/08/24 1645  HGBA1C 7.0*   Lipid Profile No results for input(s): CHOL, HDL, LDLCALC, TRIG, CHOLHDL, LDLDIRECT in the last 72 hours. Thyroid  function studies Recent Labs    08/08/24 1827  TSH 0.544   Urinalysis    Component Value Date/Time   COLORURINE YELLOW 11/21/2023 1251   APPEARANCEUR HAZY (A) 11/21/2023 1251   LABSPEC 1.014 11/21/2023 1251   PHURINE 5.0 11/21/2023 1251   GLUCOSEU NEGATIVE 11/21/2023 1251   HGBUR MODERATE (A) 11/21/2023 1251   BILIRUBINUR NEGATIVE 11/21/2023 1251   KETONESUR NEGATIVE 11/21/2023 1251   PROTEINUR NEGATIVE 11/21/2023 1251   UROBILINOGEN 4.0 (H) 10/02/2015 2135   NITRITE POSITIVE (A) 11/21/2023 1251   LEUKOCYTESUR LARGE (A) 11/21/2023 1251    FURTHER DISCHARGE INSTRUCTIONS:   Get Medicines reviewed and adjusted: Please take all your medications with you for your next visit with your Primary MD   Laboratory/radiological data: Please request your Primary MD to go over all hospital tests and procedure/radiological results at the follow up, please ask your Primary MD to get all Hospital records sent to his/her office.   In some cases, they will be blood work, cultures and biopsy results pending at the time of your discharge. Please request  that your primary care M.D. goes through all the records of your hospital data and follows up on these results.   Also Note the following: If you experience worsening of your admission symptoms, develop shortness of breath, life threatening emergency, suicidal or homicidal thoughts you must seek medical attention immediately by calling 911 or calling your MD immediately  if symptoms less severe.   You must read complete instructions/literature along with all the possible adverse reactions/side effects for all the Medicines you take and that have been prescribed to you. Take any new Medicines after you have completely understood and accpet all the possible adverse reactions/side effects.    Do not drive when taking Pain medications or sleeping medications (Benzodaizepines)   Do not take more than prescribed Pain, Sleep and Anxiety Medications. It is not advisable to combine anxiety,sleep and pain medications without talking with your primary care practitioner   Special Instructions: If you have smoked or chewed Tobacco  in the last 2 yrs please stop smoking, stop any regular Alcohol  and or any Recreational drug use.   Wear Seat belts while driving.   Please note: You were cared for by a hospitalist during your hospital stay. Once you are discharged, your primary care physician will handle any further medical issues. Please note that NO REFILLS for any discharge medications will be authorized once you are discharged, as it is imperative that you return to your primary care physician (or establish a relationship with a primary care physician if you do not have one) for your post hospital discharge needs so that they can reassess your need for medications and monitor your lab values.  Time coordinating discharge: 40 minutes  SIGNED:  Nilda Fendt, MD, PhD 08/11/2024, 10:15 AM

## 2024-08-11 NOTE — TOC Transition Note (Signed)
 Transition of Care [facility]) - Discharge Note   Patient Details  Name: Douglas Hurst. MRN: [id number] Date of Birth: 02/15/1941  [facility] CM/SW Contact:  Waddell Barnie Rama, RN Phone Number: 08/11/2024, 10:21 AM   Clinical Narrative:    For dc today, NCM notified Cory with [facility] of the dc. Patient has transport home at dc.          Patient Goals and CMS Choice            Discharge Placement                       Discharge Plan and Services Additional resources added to the After Visit Summary for                                       Social Drivers of Health (SDOH) Interventions SDOH Screenings   Food Insecurity: Unknown (08/08/2024)  Housing: Low Risk  (08/08/2024)  Transportation Needs: No Transportation Needs (08/08/2024)  Utilities: Not At Risk (08/08/2024)  Social Connections: Socially Integrated (08/08/2024)  Tobacco Use: Medium Risk (08/08/2024)     Readmission Risk Interventions     No data to display

## 2024-08-11 NOTE — Progress Notes (Signed)
 Patient discharged to home via wheelchair by SWOT RN, Madelin Barefoot, RN with all belongings.

## 2024-08-11 NOTE — Progress Notes (Signed)
 [facility] Gastroenterology Progress Note  Douglas Hurst. 83 y.o. [birth year]  CC: Rectal bleeding   Subjective: Patient seen and examined at bedside.  Denies any further bleeding.  ROS : Afebrile, negative for chest pain   Objective: Vital signs in last 24 hours: Vitals:   08/11/24 0315 08/11/24 0716  BP: (!) 161/80 (!) 145/81  Pulse: 99 93  Resp: 19 18  Temp: 97.6 F (36.4 C) 97.8 F (36.6 C)  SpO2: 96% 96%    Physical Exam: Resting comfortably, not in acute distress.  Abdominal exam benign.  Mood and affect normal.  Alert and oriented x 3.  Lab Results: Recent Labs    08/09/24 0456 08/10/24 0243  NA 136 135  K 3.8 3.6  CL 105 104  CO2 23 22  GLUCOSE 124* 124*  BUN 13 10  CREATININE 1.06 1.11  CALCIUM  8.7* 8.7*  MG  --  1.7   Recent Labs    08/08/24 1400  AST 19  ALT 14  ALKPHOS 66  BILITOT 1.1  PROT 6.8  ALBUMIN 3.2*   Recent Labs    08/10/24 0243 08/11/24 0303  WBC 7.5 8.4  HGB 14.1 13.8  HCT 43.0 41.6  MCV 91.5 91.0  PLT 245 276   Recent Labs    08/08/24 1827  LABPROT 17.9*  INR 1.4*      Assessment/Plan: - Rectal bleeding.  Colonoscopy yesterday showed severe colitis likely from ulcerative pancolitis.  Biopsies pending.  Normal TI. - Esophageal dysphagia.  EGD with balloon dilation yesterday. - History of A-fib.  Eliquis  currently on hold. - History of diarrhea.  Negative GI pathogen panel and C. difficile.  Diarrhea likely from underlying colitis.  Recommendations -------------------------- - Change IV Solu-Medrol  to oral prednisone .  Recommend taper 10 mg every week.  40 mg once a day for 1 week followed by 30 mg once a day for another week and then 20 mg once a day for a week and then another week of 10 mg once a day. - Mesalamine less likely to be helpful in the case of severe ulcerative colitis.  He will likely need biological agent.  HBs antigen negative.  QuantiFERON-TB gold pending. - Okay to discharge from GI standpoint.   Avoid NSAIDs.  Okay to resume Eliquis  from tomorrow. - Follow-up in GI clinic in 4 weeks after discharge.   Layla Lah MD, FACP 08/11/2024, 9:58 AM  Contact #  [phone]

## 2024-08-11 NOTE — Progress Notes (Signed)
 Physical Therapy Treatment Patient Details Name: Douglas Hurst. MRN: [id number] DOB: 1941/12/04 Today's Date: 08/11/2024   History of Present Illness Pt is an 83 y.o. male who presented 08/08/24 with increasing bright red blood in his stool intermittently for the past month. He saw GI outpatient and was scheduled to get a colonoscopy in September. Reportedly he was checked for C. difficile and GI pathogen panel outpatient and they were negative. PMH includes: bipolar d/o, DMII, HTN, HLD, GERD, COPD, PAF on Eliquis     PT Comments  The pt is making great functional progress, now only needing supervision for bed mobility, transfers, and gait bouts with a QC. He does display continued deficits in strength and balance, thus focused session on addressing these deficits through serial sit <> stand reps and dynamic gait challenges. The pt typically slows to a stop to perform any dynamic gait challenges, further supporting his continued balance deficits. Will continue to follow acutely.     If plan is discharge home, recommend the following: A little help with bathing/dressing/bathroom;Assistance with cooking/housework;Assist for transportation   Can travel by private vehicle        Equipment Recommendations  None recommended by PT    Recommendations for Other Services       Precautions / Restrictions Precautions Precautions: Fall Recall of Precautions/Restrictions: Intact Restrictions Weight Bearing Restrictions Per Provider Order: No     Mobility  Bed Mobility Overal bed mobility: Needs Assistance Bed Mobility: Supine to Sit, Sit to Supine     Supine to sit: HOB elevated, Supervision Sit to supine: HOB elevated, Supervision   General bed mobility comments: Supervision for safety, HOB elevated    Transfers Overall transfer level: Needs assistance Equipment used: Quad cane Transfers: Sit to/from Stand Sit to Stand: Supervision           General transfer comment:  Supervision for safety, no LOB standing from EOB to QC in L hand; x10 serial sit <> stand reps with hands in lap with supervision for safety    Ambulation/Gait Ambulation/Gait assistance: Supervision Gait Distance (Feet): 180 Feet Assistive device: Quad cane Gait Pattern/deviations: Step-through pattern, Decreased step length - right, Decreased stride length, Trunk flexed Gait velocity: reduced Gait velocity interpretation: 1.31 - 2.62 ft/sec, indicative of limited community ambulator   General Gait Details: Pt ambulates with a mildly flexed posture and with intermittently decreased R step length compared to L. Cues provided for symmetrical step-through bil with good success noted. When cued to turn head L <> R when ambulating or reach off COG when ambulating, he often slowed greatly often to a full stop. Supervision for safety   Stairs             Wheelchair Mobility     Tilt Bed    Modified Rankin (Stroke Patients Only)       Balance Overall balance assessment: Needs assistance Sitting-balance support: No upper extremity supported, Feet supported Sitting balance-Leahy Scale: Good     Standing balance support: Single extremity supported, During functional activity Standing balance-Leahy Scale: Fair Standing balance comment: Able to ambulate with QC in L hand without LOB. When reaching off COG with R hand when ambulating he often slows and stops to reach off COG                            Communication Communication Communication: Impaired Factors Affecting Communication: Hearing impaired  Cognition Arousal: Alert Behavior During Therapy: [facility] for tasks  assessed/performed   PT - Cognitive impairments: No family/caregiver present to determine baseline                         Following commands: Intact      Cueing Cueing Techniques: Verbal cues  Exercises General Exercises - Lower Extremity Hip ABduction/ADduction: AROM, Strengthening,  Both, 10 reps, Standing (with QC in one hand for support, CGA for safety) Other Exercises Other Exercises: x10 serial sit <> stand reps from EOB with hands in lap, supervision for safety Other Exercises: dynamic gait challenges, cuing pt to turn head L <> R when ambulating and reach off COG with R UE when ambulating with QC in L hand, pt often slows to a stop to do so, supervision for safety    General Comments        Pertinent Vitals/Pain Pain Assessment Pain Assessment: Faces Faces Pain Scale: No hurt Pain Intervention(s): Monitored during session    Home Living                          Prior Function            PT Goals (current goals can now be found in the care plan section) Acute Rehab PT Goals Patient Stated Goal: to get stronger PT Goal Formulation: With patient/family Time For Goal Achievement: 08/23/24 Potential to Achieve Goals: Good Progress towards PT goals: Progressing toward goals    Frequency    Min 1X/week      PT Plan      Co-evaluation              AM-PAC PT 6 Clicks Mobility   Outcome Measure  Help needed turning from your back to your side while in a flat bed without using bedrails?: A Little Help needed moving from lying on your back to sitting on the side of a flat bed without using bedrails?: A Little Help needed moving to and from a bed to a chair (including a wheelchair)?: A Little Help needed standing up from a chair using your arms (e.g., wheelchair or bedside chair)?: A Little Help needed to walk in hospital room?: A Little Help needed climbing 3-5 steps with a railing? : A Little 6 Click Score: 18    End of Session Equipment Utilized During Treatment: Gait belt Activity Tolerance: Patient tolerated treatment well Patient left: in bed;with call bell/phone within reach;with bed alarm set;with family/visitor present Nurse Communication: Mobility status PT Visit Diagnosis: Unsteadiness on feet (R26.81);Other  abnormalities of gait and mobility (R26.89);Muscle weakness (generalized) (M62.81)     Time: 9079-9068 PT Time Calculation (min) (ACUTE ONLY): 11 min  Charges:    $Therapeutic Exercise: 8-22 mins PT General Charges $$ ACUTE PT VISIT: 1 Visit                     Theo Ferretti, PT, DPT Acute Rehabilitation Services  Office: [phone]    Theo CHRISTELLA Ferretti 08/11/2024, 10:52 AM

## 2024-08-11 NOTE — Plan of Care (Signed)

## 2024-08-11 NOTE — Progress Notes (Signed)
 Discharge instructions reviewed with pt and his wife.  Copy of instructions given to pt. Pt and wife informed that his one script for his prednisone  was sent to his pharmacy for pick up.  Pt will be d/c'd via wheelchair with belongings and will be escorted by staff/hospital volunteer.   Curtis Cain,RN SWOT

## 2024-08-12 DIAGNOSIS — J449 Chronic obstructive pulmonary disease, unspecified: Secondary | ICD-10-CM | POA: Diagnosis not present

## 2024-08-12 DIAGNOSIS — I480 Paroxysmal atrial fibrillation: Secondary | ICD-10-CM | POA: Diagnosis not present

## 2024-08-12 DIAGNOSIS — I509 Heart failure, unspecified: Secondary | ICD-10-CM | POA: Diagnosis not present

## 2024-08-12 DIAGNOSIS — I714 Abdominal aortic aneurysm, without rupture, unspecified: Secondary | ICD-10-CM | POA: Diagnosis not present

## 2024-08-12 DIAGNOSIS — E1142 Type 2 diabetes mellitus with diabetic polyneuropathy: Secondary | ICD-10-CM | POA: Diagnosis not present

## 2024-08-12 DIAGNOSIS — I110 Hypertensive heart disease with heart failure: Secondary | ICD-10-CM | POA: Diagnosis not present

## 2024-08-12 DIAGNOSIS — K5190 Ulcerative colitis, unspecified, without complications: Secondary | ICD-10-CM | POA: Diagnosis not present

## 2024-08-12 DIAGNOSIS — F319 Bipolar disorder, unspecified: Secondary | ICD-10-CM | POA: Diagnosis not present

## 2024-08-12 DIAGNOSIS — K222 Esophageal obstruction: Secondary | ICD-10-CM | POA: Diagnosis not present

## 2024-08-13 LAB — QUANTIFERON-TB GOLD PLUS (RQFGPL)
QuantiFERON Mitogen Value: 0.69 [IU]/mL
QuantiFERON Nil Value: 0.01 [IU]/mL
QuantiFERON TB1 Ag Value: 0.02 [IU]/mL
QuantiFERON TB2 Ag Value: 0.01 [IU]/mL

## 2024-08-13 LAB — QUANTIFERON-TB GOLD PLUS: QuantiFERON-TB Gold Plus: NEGATIVE

## 2024-08-14 DIAGNOSIS — I480 Paroxysmal atrial fibrillation: Secondary | ICD-10-CM | POA: Diagnosis not present

## 2024-08-14 DIAGNOSIS — F319 Bipolar disorder, unspecified: Secondary | ICD-10-CM | POA: Diagnosis not present

## 2024-08-14 DIAGNOSIS — J449 Chronic obstructive pulmonary disease, unspecified: Secondary | ICD-10-CM | POA: Diagnosis not present

## 2024-08-14 DIAGNOSIS — I110 Hypertensive heart disease with heart failure: Secondary | ICD-10-CM | POA: Diagnosis not present

## 2024-08-14 DIAGNOSIS — E1142 Type 2 diabetes mellitus with diabetic polyneuropathy: Secondary | ICD-10-CM | POA: Diagnosis not present

## 2024-08-14 DIAGNOSIS — K222 Esophageal obstruction: Secondary | ICD-10-CM | POA: Diagnosis not present

## 2024-08-14 DIAGNOSIS — I714 Abdominal aortic aneurysm, without rupture, unspecified: Secondary | ICD-10-CM | POA: Diagnosis not present

## 2024-08-14 DIAGNOSIS — I509 Heart failure, unspecified: Secondary | ICD-10-CM | POA: Diagnosis not present

## 2024-08-14 DIAGNOSIS — K5190 Ulcerative colitis, unspecified, without complications: Secondary | ICD-10-CM | POA: Diagnosis not present

## 2024-08-15 ENCOUNTER — Encounter (HOSPITAL_COMMUNITY): Payer: Self-pay | Admitting: Gastroenterology

## 2024-08-15 DIAGNOSIS — K5190 Ulcerative colitis, unspecified, without complications: Secondary | ICD-10-CM | POA: Diagnosis not present

## 2024-08-15 DIAGNOSIS — I509 Heart failure, unspecified: Secondary | ICD-10-CM | POA: Diagnosis not present

## 2024-08-15 DIAGNOSIS — J449 Chronic obstructive pulmonary disease, unspecified: Secondary | ICD-10-CM | POA: Diagnosis not present

## 2024-08-15 DIAGNOSIS — E1142 Type 2 diabetes mellitus with diabetic polyneuropathy: Secondary | ICD-10-CM | POA: Diagnosis not present

## 2024-08-15 DIAGNOSIS — K222 Esophageal obstruction: Secondary | ICD-10-CM | POA: Diagnosis not present

## 2024-08-15 DIAGNOSIS — I110 Hypertensive heart disease with heart failure: Secondary | ICD-10-CM | POA: Diagnosis not present

## 2024-08-15 DIAGNOSIS — F319 Bipolar disorder, unspecified: Secondary | ICD-10-CM | POA: Diagnosis not present

## 2024-08-15 DIAGNOSIS — I714 Abdominal aortic aneurysm, without rupture, unspecified: Secondary | ICD-10-CM | POA: Diagnosis not present

## 2024-08-15 DIAGNOSIS — I480 Paroxysmal atrial fibrillation: Secondary | ICD-10-CM | POA: Diagnosis not present

## 2024-08-15 LAB — SURGICAL PATHOLOGY

## 2024-08-17 DIAGNOSIS — I480 Paroxysmal atrial fibrillation: Secondary | ICD-10-CM | POA: Diagnosis not present

## 2024-08-17 DIAGNOSIS — K222 Esophageal obstruction: Secondary | ICD-10-CM | POA: Diagnosis not present

## 2024-08-17 DIAGNOSIS — I509 Heart failure, unspecified: Secondary | ICD-10-CM | POA: Diagnosis not present

## 2024-08-17 DIAGNOSIS — F319 Bipolar disorder, unspecified: Secondary | ICD-10-CM | POA: Diagnosis not present

## 2024-08-17 DIAGNOSIS — K5190 Ulcerative colitis, unspecified, without complications: Secondary | ICD-10-CM | POA: Diagnosis not present

## 2024-08-17 DIAGNOSIS — J449 Chronic obstructive pulmonary disease, unspecified: Secondary | ICD-10-CM | POA: Diagnosis not present

## 2024-08-17 DIAGNOSIS — E1142 Type 2 diabetes mellitus with diabetic polyneuropathy: Secondary | ICD-10-CM | POA: Diagnosis not present

## 2024-08-17 DIAGNOSIS — I110 Hypertensive heart disease with heart failure: Secondary | ICD-10-CM | POA: Diagnosis not present

## 2024-08-17 DIAGNOSIS — I714 Abdominal aortic aneurysm, without rupture, unspecified: Secondary | ICD-10-CM | POA: Diagnosis not present

## 2024-08-17 LAB — RETICULIN ANTIBODIES, IGA W TITER: Reticulin Ab, IgA: NEGATIVE {titer} (ref <2.5)

## 2024-08-18 ENCOUNTER — Telehealth: Payer: Self-pay | Admitting: *Deleted

## 2024-08-18 DIAGNOSIS — E1142 Type 2 diabetes mellitus with diabetic polyneuropathy: Secondary | ICD-10-CM | POA: Diagnosis not present

## 2024-08-18 DIAGNOSIS — I509 Heart failure, unspecified: Secondary | ICD-10-CM | POA: Diagnosis not present

## 2024-08-18 DIAGNOSIS — J449 Chronic obstructive pulmonary disease, unspecified: Secondary | ICD-10-CM | POA: Diagnosis not present

## 2024-08-18 DIAGNOSIS — K222 Esophageal obstruction: Secondary | ICD-10-CM | POA: Diagnosis not present

## 2024-08-18 DIAGNOSIS — I480 Paroxysmal atrial fibrillation: Secondary | ICD-10-CM | POA: Diagnosis not present

## 2024-08-18 DIAGNOSIS — I110 Hypertensive heart disease with heart failure: Secondary | ICD-10-CM | POA: Diagnosis not present

## 2024-08-18 DIAGNOSIS — K5190 Ulcerative colitis, unspecified, without complications: Secondary | ICD-10-CM | POA: Diagnosis not present

## 2024-08-18 DIAGNOSIS — I714 Abdominal aortic aneurysm, without rupture, unspecified: Secondary | ICD-10-CM | POA: Diagnosis not present

## 2024-08-18 DIAGNOSIS — F319 Bipolar disorder, unspecified: Secondary | ICD-10-CM | POA: Diagnosis not present

## 2024-08-18 NOTE — Progress Notes (Signed)
 Complex Care Management Note  Care Guide Note 08/18/2024 Name: Monterrius Cardosa. MRN: [id number] DOB: May 29, 1941  Zachary LITTIE [name]. is a 83 y.o. year old male who sees Charlott Dorn LABOR, MD for primary care. I reached out to Zachary LITTIE [name]. by phone today to offer complex care management services.  Mr. Tiffany was given information about Complex Care Management services today including:   The Complex Care Management services include support from the care team which includes your Nurse Care Manager, Clinical Social Worker, or Pharmacist.  The Complex Care Management team is here to help remove barriers to the health concerns and goals most important to you. Complex Care Management services are voluntary, and the patient may decline or stop services at any time by request to their care team member.   Complex Care Management Consent Status: Patient agreed to services and verbal consent obtained.   Follow up plan:  Telephone appointment with complex care management team member scheduled for:  08/31/24  Encounter Outcome:  Patient Scheduled  Harlene Satterfield  [facility]  [facility], [facility] Guide  Direct Dial: [phone]  Fax [phone]

## 2024-08-22 DIAGNOSIS — E1142 Type 2 diabetes mellitus with diabetic polyneuropathy: Secondary | ICD-10-CM | POA: Diagnosis not present

## 2024-08-22 DIAGNOSIS — I480 Paroxysmal atrial fibrillation: Secondary | ICD-10-CM | POA: Diagnosis not present

## 2024-08-22 DIAGNOSIS — J449 Chronic obstructive pulmonary disease, unspecified: Secondary | ICD-10-CM | POA: Diagnosis not present

## 2024-08-22 DIAGNOSIS — E1140 Type 2 diabetes mellitus with diabetic neuropathy, unspecified: Secondary | ICD-10-CM | POA: Diagnosis not present

## 2024-08-22 DIAGNOSIS — I10 Essential (primary) hypertension: Secondary | ICD-10-CM | POA: Diagnosis not present

## 2024-08-22 DIAGNOSIS — K219 Gastro-esophageal reflux disease without esophagitis: Secondary | ICD-10-CM | POA: Diagnosis not present

## 2024-08-22 DIAGNOSIS — E1151 Type 2 diabetes mellitus with diabetic peripheral angiopathy without gangrene: Secondary | ICD-10-CM | POA: Diagnosis not present

## 2024-08-22 DIAGNOSIS — K222 Esophageal obstruction: Secondary | ICD-10-CM | POA: Diagnosis not present

## 2024-08-22 DIAGNOSIS — K5190 Ulcerative colitis, unspecified, without complications: Secondary | ICD-10-CM | POA: Diagnosis not present

## 2024-08-22 DIAGNOSIS — I509 Heart failure, unspecified: Secondary | ICD-10-CM | POA: Diagnosis not present

## 2024-08-22 DIAGNOSIS — I714 Abdominal aortic aneurysm, without rupture, unspecified: Secondary | ICD-10-CM | POA: Diagnosis not present

## 2024-08-22 DIAGNOSIS — K922 Gastrointestinal hemorrhage, unspecified: Secondary | ICD-10-CM | POA: Diagnosis not present

## 2024-08-22 DIAGNOSIS — K5100 Ulcerative (chronic) pancolitis without complications: Secondary | ICD-10-CM | POA: Diagnosis not present

## 2024-08-22 DIAGNOSIS — F319 Bipolar disorder, unspecified: Secondary | ICD-10-CM | POA: Diagnosis not present

## 2024-08-22 DIAGNOSIS — F0390 Unspecified dementia without behavioral disturbance: Secondary | ICD-10-CM | POA: Diagnosis not present

## 2024-08-22 DIAGNOSIS — I110 Hypertensive heart disease with heart failure: Secondary | ICD-10-CM | POA: Diagnosis not present

## 2024-08-22 DIAGNOSIS — N400 Enlarged prostate without lower urinary tract symptoms: Secondary | ICD-10-CM | POA: Diagnosis not present

## 2024-08-23 ENCOUNTER — Ambulatory Visit (HOSPITAL_COMMUNITY)
Admission: RE | Admit: 2024-08-23 | Discharge: 2024-08-23 | Disposition: A | Discharge status: Home / Self Care | Source: Ambulatory Visit | Attending: Internal Medicine | Admitting: Internal Medicine

## 2024-08-23 DIAGNOSIS — R1310 Dysphagia, unspecified: Secondary | ICD-10-CM

## 2024-08-23 DIAGNOSIS — R1312 Dysphagia, oropharyngeal phase: Secondary | ICD-10-CM | POA: Diagnosis not present

## 2024-08-23 NOTE — Progress Notes (Signed)
 Modified Barium Swallow Study  Patient Details  Name: Douglas Hurst. MRN: [id number] Date of Birth: [birth year]  Today's Date: 08/23/2024  Modified Barium Swallow completed.  Full report located under Chart Review in the Imaging Section.  History of Present Illness Patient is an 83 y.o. male with past medical history of atrial fibrillation on Eliquis , history of COPD, hypertension, bipolar disorder presented to the hospital with bright red blood per rectum which has been intermittent for last month. He was seen in the GI office recently for evaluation of blood in the stool, dysphagia as well as FOBT positive stool. Received outpatient EGD with dilation and colonoscopy after receiving clearance to hold his Eliquis  on 8/14. Was recommend for MBS due to reporting to have trouble swallowing and concerns from patient about food traveling back up the esophagus after swallowing, per note from RT on 8/27.   Clinical Impression Patient exhibits a mild orophrayngeal dysphagia. No penetration or aspiration observed with any of the tested boluses. Primary impairments included prolonged chewing, delayed initiation of tongue movement, swallow inititaion delay at level of pyriform sinuses, pharyngeal residuals with all tested consistencies resulting in mild to moderate amount of residuals in the valleculae as well as a trace amount in the pyriform sinuses. Pill was administered with thin liquids first resulting in it becoming lodged in vallecular sinus. But when given with the puree, the pill transited through the pharynx. Esophageal sweep slow but complete transit of barium tablet through esophagus. Patient had no complaints and EGD with dialation was reported to solve his issues with swallowing. SLP recommends a Dys 3 (soft) diet with thin liquids.  DIGEST Swallow Severity Rating*  Safety: 0  Efficiency:1  Overall Pharyngeal Swallow Severity: 1 (mild) 1: mild; 2: moderate; 3: severe; 4: profound  *The  Dynamic Imaging Grade of Swallowing Toxicity is standardized for the head and neck cancer population, however, demonstrates promising clinical applications across populations to standardize the clinical rating of pharyngeal swallow safety and severity.  Factors that may increase risk of adverse event in presence of aspiration Noe & Lianne 2021):    Swallow Evaluation Recommendations Recommendations: PO diet PO Diet Recommendation: Dysphagia 3 (Mechanical soft);Thin liquids (Level 0) Liquid Administration via: Spoon;Cup;Straw Medication Administration: Whole meds with puree Supervision: Patient able to self-feed Swallowing strategies  : Slow rate;Small bites/sips Postural changes: Position pt fully upright for meals;Stay upright 30-60 min after meals Oral care recommendations: Oral care BID (2x/day)     Damien Hy  Graduate SLP Clinican

## 2024-08-24 DIAGNOSIS — I714 Abdominal aortic aneurysm, without rupture, unspecified: Secondary | ICD-10-CM | POA: Diagnosis not present

## 2024-08-24 DIAGNOSIS — E1142 Type 2 diabetes mellitus with diabetic polyneuropathy: Secondary | ICD-10-CM | POA: Diagnosis not present

## 2024-08-24 DIAGNOSIS — I509 Heart failure, unspecified: Secondary | ICD-10-CM | POA: Diagnosis not present

## 2024-08-24 DIAGNOSIS — J449 Chronic obstructive pulmonary disease, unspecified: Secondary | ICD-10-CM | POA: Diagnosis not present

## 2024-08-24 DIAGNOSIS — F319 Bipolar disorder, unspecified: Secondary | ICD-10-CM | POA: Diagnosis not present

## 2024-08-24 DIAGNOSIS — I480 Paroxysmal atrial fibrillation: Secondary | ICD-10-CM | POA: Diagnosis not present

## 2024-08-24 DIAGNOSIS — K222 Esophageal obstruction: Secondary | ICD-10-CM | POA: Diagnosis not present

## 2024-08-24 DIAGNOSIS — I110 Hypertensive heart disease with heart failure: Secondary | ICD-10-CM | POA: Diagnosis not present

## 2024-08-24 DIAGNOSIS — K5190 Ulcerative colitis, unspecified, without complications: Secondary | ICD-10-CM | POA: Diagnosis not present

## 2024-08-27 DIAGNOSIS — E1140 Type 2 diabetes mellitus with diabetic neuropathy, unspecified: Secondary | ICD-10-CM | POA: Diagnosis not present

## 2024-08-27 DIAGNOSIS — F319 Bipolar disorder, unspecified: Secondary | ICD-10-CM | POA: Diagnosis not present

## 2024-08-27 DIAGNOSIS — N400 Enlarged prostate without lower urinary tract symptoms: Secondary | ICD-10-CM | POA: Diagnosis not present

## 2024-08-27 DIAGNOSIS — I480 Paroxysmal atrial fibrillation: Secondary | ICD-10-CM | POA: Diagnosis not present

## 2024-08-27 DIAGNOSIS — J449 Chronic obstructive pulmonary disease, unspecified: Secondary | ICD-10-CM | POA: Diagnosis not present

## 2024-08-27 DIAGNOSIS — I110 Hypertensive heart disease with heart failure: Secondary | ICD-10-CM | POA: Diagnosis not present

## 2024-08-27 DIAGNOSIS — F0390 Unspecified dementia without behavioral disturbance: Secondary | ICD-10-CM | POA: Diagnosis not present

## 2024-08-27 DIAGNOSIS — E782 Mixed hyperlipidemia: Secondary | ICD-10-CM | POA: Diagnosis not present

## 2024-08-29 DIAGNOSIS — K222 Esophageal obstruction: Secondary | ICD-10-CM | POA: Diagnosis not present

## 2024-08-29 DIAGNOSIS — I110 Hypertensive heart disease with heart failure: Secondary | ICD-10-CM | POA: Diagnosis not present

## 2024-08-29 DIAGNOSIS — E1142 Type 2 diabetes mellitus with diabetic polyneuropathy: Secondary | ICD-10-CM | POA: Diagnosis not present

## 2024-08-29 DIAGNOSIS — I480 Paroxysmal atrial fibrillation: Secondary | ICD-10-CM | POA: Diagnosis not present

## 2024-08-29 DIAGNOSIS — F319 Bipolar disorder, unspecified: Secondary | ICD-10-CM | POA: Diagnosis not present

## 2024-08-29 DIAGNOSIS — K5190 Ulcerative colitis, unspecified, without complications: Secondary | ICD-10-CM | POA: Diagnosis not present

## 2024-08-29 DIAGNOSIS — I509 Heart failure, unspecified: Secondary | ICD-10-CM | POA: Diagnosis not present

## 2024-08-29 DIAGNOSIS — I714 Abdominal aortic aneurysm, without rupture, unspecified: Secondary | ICD-10-CM | POA: Diagnosis not present

## 2024-08-29 DIAGNOSIS — J449 Chronic obstructive pulmonary disease, unspecified: Secondary | ICD-10-CM | POA: Diagnosis not present

## 2024-08-31 ENCOUNTER — Other Ambulatory Visit: Payer: Self-pay

## 2024-08-31 ENCOUNTER — Telehealth

## 2024-08-31 DIAGNOSIS — E1142 Type 2 diabetes mellitus with diabetic polyneuropathy: Secondary | ICD-10-CM | POA: Diagnosis not present

## 2024-08-31 DIAGNOSIS — F319 Bipolar disorder, unspecified: Secondary | ICD-10-CM | POA: Diagnosis not present

## 2024-08-31 DIAGNOSIS — K5190 Ulcerative colitis, unspecified, without complications: Secondary | ICD-10-CM | POA: Diagnosis not present

## 2024-08-31 DIAGNOSIS — I110 Hypertensive heart disease with heart failure: Secondary | ICD-10-CM | POA: Diagnosis not present

## 2024-08-31 DIAGNOSIS — K222 Esophageal obstruction: Secondary | ICD-10-CM | POA: Diagnosis not present

## 2024-08-31 DIAGNOSIS — I509 Heart failure, unspecified: Secondary | ICD-10-CM | POA: Diagnosis not present

## 2024-08-31 DIAGNOSIS — I480 Paroxysmal atrial fibrillation: Secondary | ICD-10-CM | POA: Diagnosis not present

## 2024-08-31 DIAGNOSIS — I714 Abdominal aortic aneurysm, without rupture, unspecified: Secondary | ICD-10-CM | POA: Diagnosis not present

## 2024-08-31 DIAGNOSIS — J449 Chronic obstructive pulmonary disease, unspecified: Secondary | ICD-10-CM | POA: Diagnosis not present

## 2024-08-31 NOTE — Patient Outreach (Signed)
 Complex Care Management   Visit Note  08/31/2024  Name:  Douglas Hurst. MRN: [id number] DOB: [birth year]  Situation: Referral received for Complex Care Management related to COPD I obtained verbal consent from Patient.  Visit completed with Patient  on the phone  Background:   Past Medical History:  Diagnosis Date   Bipolar disorder (HCC)    Chronic back pain    Complication of anesthesia    prolonged sedation and confusion   COPD (chronic obstructive pulmonary disease) (HCC)    Dr. signa   Diabetes mellitus without complication (HCC)    type 2   Diverticulosis    Dysrhythmia    Afib  onset 03-2022   ED (erectile dysfunction)    Gallstones    GERD (gastroesophageal reflux disease)    Hiatal hernia    Hyperlipidemia    Hypertension    Weakness 03/29/2023   Wears dentures    upper   Wears glasses    Wears hearing aid in both ears     Assessment: Patient Reported Symptoms:  Cognitive Cognitive Status: Able to follow simple commands, Alert and oriented to person, place, and time, Normal speech and language skills Cognitive/Intellectual Conditions Management [RPT]: None reported or documented in medical history or problem list   Health Maintenance Behaviors: Annual physical exam, Healthy diet Health Facilitated by: Healthy diet  Neurological Neurological Review of Symptoms: No symptoms reported    HEENT HEENT Symptoms Reported: No symptoms reported      Cardiovascular Cardiovascular Symptoms Reported: No symptoms reported Does patient have uncontrolled Hypertension?: Yes Is patient checking Blood Pressure at home?: Yes Patient's Recent BP reading at home: 108/62 Cardiovascular Management Strategies: Medication therapy, Routine screening, Weight management Do You Have a Working Readable Scale?: Yes Weight: 187 lb (84.8 kg) (Patient reported)  Respiratory Respiratory Symptoms Reported: Shortness of breath Other Respiratory Symptoms: A little bit of shortness of  breath with exertion, relieved with rest Additional Respiratory Details: Last PRN Spiriva  use yesterday Respiratory Management Strategies: Coping strategies, Routine screening  Endocrine Endocrine Symptoms Reported: No symptoms reported Is patient diabetic?: Yes Is patient checking blood sugars at home?: Yes List most recent blood sugar readings, include date and time of day: 1-2 times a week, yesterday 111    Gastrointestinal Gastrointestinal Symptoms Reported: No symptoms reported Additional Gastrointestinal Details: Patient reports a good appetite. Patient denies bleeding in stool since hospitalization about a month ago. Last BM this morning Gastrointestinal Comment: Patient has recently been advanced from soft diet to regular diet. Patient's wife reports that he is scheduled with gastroenterology next week.    Genitourinary Genitourinary Symptoms Reported: No symptoms reported    Integumentary Integumentary Symptoms Reported: No symptoms reported    Musculoskeletal Musculoskelatal Symptoms Reviewed: Unsteady gait Additional Musculoskeletal Details: Patient feels that strength is fair Musculoskeletal Management Strategies: Medical device Elene) Musculoskeletal Comment: HH RN and PT through [facility]. OT has signed off Falls in the past year?: No Number of falls in past year: 1 or less Was there an injury with Fall?: No Fall Risk Category Calculator: 0 Patient Fall Risk Level: Low Fall Risk Patient at Risk for Falls Due to: Impaired balance/gait Fall risk Follow up: Falls evaluation completed, Education provided, Falls prevention discussed  Psychosocial Psychosocial Symptoms Reported: No symptoms reported     Quality of Family Relationships: helpful, involved, supportive Do you feel physically threatened by others?: No    08/31/2024    PHQ2-9 Depression Screening   Little interest or pleasure in doing things  Not at all  Feeling down, depressed, or hopeless Not at all  PHQ-2 -  Total Score 0  Trouble falling or staying asleep, or sleeping too much    Feeling tired or having little energy    Poor appetite or overeating     Feeling bad about yourself - or that you are a failure or have let yourself or your family down    Trouble concentrating on things, such as reading the newspaper or watching television    Moving or speaking so slowly that other people could have noticed.  Or the opposite - being so fidgety or restless that you have been moving around a lot more than usual    Thoughts that you would be better off dead, or hurting yourself in some way    PHQ2-9 Total Score    If you checked off any problems, how difficult have these problems made it for you to do your work, take care of things at home, or get along with other people    Depression Interventions/Treatment      There were no vitals filed for this visit.  Medications Reviewed Today     Reviewed by Arno Rosaline SQUIBB, RN (Registered Nurse) on 08/31/24 at 1055  Med List Status: <None>   Medication Order Taking? Sig Documenting Provider Last Dose Status Informant  acetaminophen  (TYLENOL ) 325 MG tablet [id number] Yes Take 2 tablets (650 mg total) by mouth every 6 (six) hours as needed for mild pain (or Fever >/= 101). Sheikh, Omair [place], DO  Active Spouse/Significant Other, Self  donepezil  (ARICEPT ) 5 MG tablet [id number] Yes Take 5 mg by mouth at bedtime. [provider]  Active Self, Spouse/Significant Other  ELIQUIS  5 MG TABS tablet [id number] Yes TAKE 1 TABLET BY MOUTH TWICE A DAY Walker, Caitlin S, NP  Active Spouse/Significant Other, Self  finasteride  (PROSCAR ) 5 MG tablet [id number] Yes Take 5 mg by mouth daily. [provider]  Active Spouse/Significant Other, Self  furosemide  (LASIX ) 20 MG tablet [id number] Yes Take 1 tablet (20 mg total) by mouth daily.  Patient taking differently: Take 20 mg by mouth. Monday, Wednesday, Friday per PCP instructions   Lonni Slain, MD   Active Spouse/Significant Other, Self  Multiple Vitamin (MULTIVITAMIN WITH MINERALS) TABS tablet [id number] Yes Take 1 tablet by mouth daily. [provider]  Active Spouse/Significant Other, Self  pantoprazole  (PROTONIX ) 40 MG tablet [id number] Yes Take 40 mg by mouth daily. [provider]  Active Spouse/Significant Other, Self  polyethylene glycol powder (GLYCOLAX /MIRALAX ) 17 GM/SCOOP powder [id number] Yes Take 17 g by mouth daily.  Patient taking differently: Take 17 g by mouth daily as needed for mild constipation or moderate constipation.   Sheikh, Omair Tavares, DO  Active Spouse/Significant Other, Self  pravastatin  (PRAVACHOL ) 40 MG tablet [id number] Yes Take 1 tablet (40 mg total) by mouth every evening. Raford Riggs, MD  Active Spouse/Significant Other, Self  predniSONE  (DELTASONE ) 10 MG tablet [id number] Yes Take 4 tablets (40 mg total) by mouth daily for 7 days, THEN 3 tablets (30 mg total) daily for 7 days, THEN 2 tablets (20 mg total) daily for 7 days, THEN 1 tablet (10 mg total) daily for 7 days. Gherghe, Costin M, MD  Active   SPIRIVA  RESPIMAT 2.5 MCG/ACT AERS [id number] Yes Inhale 2 puffs into the lungs daily as needed (COPD). [provider]  Active Spouse/Significant Other, Self  spironolactone  (ALDACTONE ) 25 MG tablet [id number] Yes TAKE 1 TABLET (25 MG TOTAL) BY MOUTH DAILY.  Patient  taking differently: Take 12.5 mg by mouth daily.   Vannie Reche RAMAN, NP  Active Spouse/Significant Other, Self  tadalafil (CIALIS) 20 MG tablet [id number] Yes Take 20 mg by mouth every three (3) days as needed for erectile dysfunction. [provider]  Active Spouse/Significant Other, Self  tamsulosin  (FLOMAX ) 0.4 MG CAPS capsule [id number] Yes Take 0.4 mg by mouth daily after supper. [provider]  Active Spouse/Significant Other, Self            Recommendation:   Continue Current Plan of Care  Follow Up Plan:   Telephone follow up appointment  date/time:  09/14/24 at 10 AM  Rosaline Finlay, RN MSN [place]  [facility] RN Care Manager Direct Dial: [phone]  Fax: [phone]

## 2024-08-31 NOTE — Patient Instructions (Signed)
 Visit Information  Thank you for taking time to visit with me today. Please don't hesitate to contact me if I can be of assistance to you before our next scheduled appointment.  Our next appointment is by telephone on 09/14/24 at 10 AM Please call the care guide team at [phone] if you need to cancel or reschedule your appointment.   Following is a copy of your care plan:   Goals Addressed             This Visit's Progress    [facility] RN Care Plan   On track    Problems:  Chronic Disease Management support and education needs related to CHF, COPD, and recent hospitalization for GI bleed  Goal: Over the next 30 days the Patient will attend all scheduled medical appointments: Gastroenterology as evidenced by completed visit notes uploaded to EMR        demonstrate a decrease GI bleed in exacerbations as evidenced by patient report of no further bleeding in stool demonstrate Ongoing adherence to prescribed treatment plan for COPD as evidenced by patient report of no worsening symptoms related to COPD, not requiring increased PRN inhaler use demonstrate Ongoing health management independence as evidenced by patient continuing to monitor BP and weight daily at home         Interventions:   Heart Failure Interventions: Provided education on low sodium diet Assessed need for readable accurate scales in home Advised patient to weigh each morning after emptying bladder Discussed importance of daily weight and advised patient to weigh and record daily Reviewed role of diuretics in prevention of fluid overload and management of heart failure; Discussed the importance of keeping all appointments with provider Screening for signs and symptoms of depression related to chronic disease state  Assessed social determinant of health barriers   COPD Interventions: Provided instruction about proper use of medications used for management of COPD including inhalers  General Interventions Ensured  patient has gastroenterology follow-up scheduled due to recent hospitalization for GI bleed Ensured patient is having no further blood in his stool Ensured home health is making visits as ordered  Patient Self-Care Activities:  Attend all scheduled provider appointments Call provider office for new concerns or questions  Perform all self care activities independently  Take medications as prescribed   call office if I gain more than 2 pounds in one day or 5 pounds in one week use salt in moderation watch for swelling in feet, ankles and legs every day weigh myself daily  Plan:  Telephone follow up appointment with care management team member scheduled for:  09/14/24 at 10 AM             Please call the Suicide and Crisis Lifeline: 988 call 1-800-273-TALK (toll free, 24 hour hotline) if you are experiencing a Mental Health or Behavioral Health Crisis or need someone to talk to.  Patient verbalizes understanding of instructions and care plan provided today and agrees to view in MyChart. Active MyChart status and patient understanding of how to access instructions and care plan via MyChart confirmed with patient.     Rosaline Finlay, RN MSN [place]  [facility] RN Care Manager Direct Dial: [phone]  Fax: [phone]

## 2024-09-05 DIAGNOSIS — K5190 Ulcerative colitis, unspecified, without complications: Secondary | ICD-10-CM | POA: Diagnosis not present

## 2024-09-05 DIAGNOSIS — K222 Esophageal obstruction: Secondary | ICD-10-CM | POA: Diagnosis not present

## 2024-09-05 DIAGNOSIS — J449 Chronic obstructive pulmonary disease, unspecified: Secondary | ICD-10-CM | POA: Diagnosis not present

## 2024-09-05 DIAGNOSIS — I509 Heart failure, unspecified: Secondary | ICD-10-CM | POA: Diagnosis not present

## 2024-09-05 DIAGNOSIS — I714 Abdominal aortic aneurysm, without rupture, unspecified: Secondary | ICD-10-CM | POA: Diagnosis not present

## 2024-09-05 DIAGNOSIS — F319 Bipolar disorder, unspecified: Secondary | ICD-10-CM | POA: Diagnosis not present

## 2024-09-05 DIAGNOSIS — I480 Paroxysmal atrial fibrillation: Secondary | ICD-10-CM | POA: Diagnosis not present

## 2024-09-05 DIAGNOSIS — E1142 Type 2 diabetes mellitus with diabetic polyneuropathy: Secondary | ICD-10-CM | POA: Diagnosis not present

## 2024-09-05 DIAGNOSIS — I110 Hypertensive heart disease with heart failure: Secondary | ICD-10-CM | POA: Diagnosis not present

## 2024-09-06 DIAGNOSIS — K529 Noninfective gastroenteritis and colitis, unspecified: Secondary | ICD-10-CM | POA: Diagnosis not present

## 2024-09-07 DIAGNOSIS — I714 Abdominal aortic aneurysm, without rupture, unspecified: Secondary | ICD-10-CM | POA: Diagnosis not present

## 2024-09-07 DIAGNOSIS — J449 Chronic obstructive pulmonary disease, unspecified: Secondary | ICD-10-CM | POA: Diagnosis not present

## 2024-09-07 DIAGNOSIS — K222 Esophageal obstruction: Secondary | ICD-10-CM | POA: Diagnosis not present

## 2024-09-07 DIAGNOSIS — E1142 Type 2 diabetes mellitus with diabetic polyneuropathy: Secondary | ICD-10-CM | POA: Diagnosis not present

## 2024-09-07 DIAGNOSIS — I480 Paroxysmal atrial fibrillation: Secondary | ICD-10-CM | POA: Diagnosis not present

## 2024-09-07 DIAGNOSIS — K5190 Ulcerative colitis, unspecified, without complications: Secondary | ICD-10-CM | POA: Diagnosis not present

## 2024-09-07 DIAGNOSIS — F319 Bipolar disorder, unspecified: Secondary | ICD-10-CM | POA: Diagnosis not present

## 2024-09-07 DIAGNOSIS — I110 Hypertensive heart disease with heart failure: Secondary | ICD-10-CM | POA: Diagnosis not present

## 2024-09-07 DIAGNOSIS — I509 Heart failure, unspecified: Secondary | ICD-10-CM | POA: Diagnosis not present

## 2024-09-13 ENCOUNTER — Telehealth (HOSPITAL_BASED_OUTPATIENT_CLINIC_OR_DEPARTMENT_OTHER): Payer: Self-pay | Admitting: Cardiology

## 2024-09-13 DIAGNOSIS — I5032 Chronic diastolic (congestive) heart failure: Secondary | ICD-10-CM

## 2024-09-13 MED ORDER — FUROSEMIDE 20 MG PO TABS: ORAL_TABLET | ORAL | 3 refills | Status: DC

## 2024-09-13 NOTE — Telephone Encounter (Signed)
 Advised patient, verbalized understanding

## 2024-09-13 NOTE — Telephone Encounter (Signed)
 Pt c/o medication issue:  1. Name of Medication:   furosemide  (LASIX ) 20 MG tablet   2. How are you currently taking this medication (dosage and times per day)?   3. Are you having a reaction (difficulty breathing--STAT)?   4. What is your medication issue?   Wife Emmie) called to report patient's PCP had changed this medication to be taken 3 times per week on Monday, Wednesday and Friday since 6/23.  Wife wants new prescription with current instructions sent to pharmacy - [id number] - [place], [place] - [address].

## 2024-09-14 ENCOUNTER — Telehealth: Payer: Self-pay

## 2024-09-14 NOTE — Patient Outreach (Signed)
 Care Coordination   09/14/2024 Name: Douglas Hurst. MRN: [id number] DOB: [birth year]   Care Coordination Outreach Attempts:  An unsuccessful outreach was attempted for an appointment today.  Follow Up Plan:  Additional outreach attempts will be made to complete CCM follow-up visit.   Encounter Outcome:  No Answer. HIPAA compliant voicemail left requesting return call.   Rosaline Finlay, RN MSN [place]  [facility] Population Health RN Care Manager Direct Dial: [phone]  Fax: [phone]

## 2024-09-15 ENCOUNTER — Telehealth: Payer: Self-pay

## 2024-09-15 NOTE — Patient Outreach (Signed)
 Called and spoke with patient's wife Randie. She is not with patient and asks that I call back another time. CCM follow-up visit rescheduled to 09/21/24 at 10:30 AM.  Rosaline Finlay, RN MSN [place]  [facility] RN Care Manager Direct Dial: [phone]  Fax: [phone]

## 2024-09-21 ENCOUNTER — Other Ambulatory Visit: Payer: Self-pay

## 2024-09-21 NOTE — Patient Outreach (Signed)
 Complex Care Management   Visit Note  09/21/2024  Name:  Douglas Hurst. MRN: [id number] DOB: 08/16/1941  Situation: Referral received for Complex Care Management related to COPD I obtained verbal consent from Patient.  Visit completed with Patient  on the phone  Background:   Past Medical History:  Diagnosis Date   Bipolar disorder (HCC)    Chronic back pain    Complication of anesthesia    prolonged sedation and confusion   COPD (chronic obstructive pulmonary disease) (HCC)    Dr. signa   Diabetes mellitus without complication (HCC)    type 2   Diverticulosis    Dysrhythmia    Afib  onset 03-2022   ED (erectile dysfunction)    Gallstones    GERD (gastroesophageal reflux disease)    Hiatal hernia    Hyperlipidemia    Hypertension    Weakness 03/29/2023   Wears dentures    upper   Wears glasses    Wears hearing aid in both ears     Assessment: Patient Reported Symptoms:  Cognitive Cognitive Status: Able to follow simple commands, Alert and oriented to person, place, and time Cognitive/Intellectual Conditions Management [RPT]: None reported or documented in medical history or problem list      Neurological Neurological Review of Symptoms: Not assessed    HEENT HEENT Symptoms Reported: Not assessed      Cardiovascular Cardiovascular Symptoms Reported: No symptoms reported Does patient have uncontrolled Hypertension?: Yes Is patient checking Blood Pressure at home?: Yes Patient's Recent BP reading at home: 110/60, 134/72 Cardiovascular Management Strategies: Medication therapy, Routine screening, Weight management Do You Have a Working Readable Scale?: Yes Weight: 185 lb (83.9 kg) (Reported by patient's wife)  Respiratory Respiratory Symptoms Reported: Shortness of breath Other Respiratory Symptoms: Shortness of breath at baseline Additional Respiratory Details: Patient's wife reports Spiriva  use once a day. Reviewed pursed lip breathing Respiratory  Management Strategies: Coping strategies, Routine screening, Medication therapy  Endocrine Endocrine Symptoms Reported: No symptoms reported Is patient diabetic?: Yes Is patient checking blood sugars at home?: Yes List most recent blood sugar readings, include date and time of day: 1-2 times a week, 133 most recent    Gastrointestinal Gastrointestinal Symptoms Reported: No symptoms reported Additional Gastrointestinal Details: Patient's wife reports a good appetite. She reports bowel movements have firmed up since addition of Lialda from GI. She reports next follow-up with GI is 10/17/24. Patient's wife denies blood in the stool. Paitent is tolerating a regular diet Gastrointestinal Management Strategies: Medication therapy    Genitourinary Genitourinary Symptoms Reported: Not assessed    Integumentary Integumentary Symptoms Reported: Not assessed    Musculoskeletal Musculoskelatal Symptoms Reviewed: Unsteady gait Additional Musculoskeletal Details: Patient's wife reports since most recent hospitalization, she has noticed he is not getting around as good as he used to. She reports he tends to shuffle his feet more. Musculoskeletal Management Strategies: Medical device Elene) Musculoskeletal Comment: Patient's wife reports HH PT signed off one week ago. She feels that PT did make a difference. She reports they have been doing exercises provided by PT Falls in the past year?: No Number of falls in past year: 1 or less Was there an injury with Fall?: No Fall Risk Category Calculator: 0 Patient Fall Risk Level: Low Fall Risk Patient at Risk for Falls Due to: Impaired balance/gait Fall risk Follow up: Falls evaluation completed, Education provided, Falls prevention discussed  Psychosocial Psychosocial Symptoms Reported: Not assessed          09/21/2024  PHQ2-9 Depression Screening   Little interest or pleasure in doing things    Feeling down, depressed, or hopeless    PHQ-2 - Total  Score    Trouble falling or staying asleep, or sleeping too much    Feeling tired or having little energy    Poor appetite or overeating     Feeling bad about yourself - or that you are a failure or have let yourself or your family down    Trouble concentrating on things, such as reading the newspaper or watching television    Moving or speaking so slowly that other people could have noticed.  Or the opposite - being so fidgety or restless that you have been moving around a lot more than usual    Thoughts that you would be better off dead, or hurting yourself in some way    PHQ2-9 Total Score    If you checked off any problems, how difficult have these problems made it for you to do your work, take care of things at home, or get along with other people    Depression Interventions/Treatment      There were no vitals filed for this visit.  Medications Reviewed Today     Reviewed by Arno Rosaline SQUIBB, RN (Registered Nurse) on 09/21/24 at 1116  Med List Status: <None>   Medication Order Taking? Sig Documenting Provider Last Dose Status Informant  acetaminophen  (TYLENOL ) 325 MG tablet [id number]  Take 2 tablets (650 mg total) by mouth every 6 (six) hours as needed for mild pain (or Fever >/= 101). Sheikh, Omair [place], DO  Active Spouse/Significant Other, Self  donepezil  (ARICEPT ) 5 MG tablet [id number]  Take 5 mg by mouth at bedtime. [provider]  Active Self, Spouse/Significant Other  ELIQUIS  5 MG TABS tablet [id number]  TAKE 1 TABLET BY MOUTH TWICE A DAY Walker, Caitlin S, NP  Active Spouse/Significant Other, Self  finasteride  (PROSCAR ) 5 MG tablet [id number]  Take 5 mg by mouth daily. [provider]  Active Spouse/Significant Other, Self  furosemide  (LASIX ) 20 MG tablet [id number]  Monday, Wednesday, Friday or as directed Lonni Slain, MD  Active   mesalamine (LIALDA) 1.2 g EC tablet [id number] Yes Take 1.5 g by mouth daily with breakfast. [provider]   Active   Multiple Vitamin (MULTIVITAMIN WITH MINERALS) TABS tablet [id number]  Take 1 tablet by mouth daily. [provider]  Active Spouse/Significant Other, Self  pantoprazole  (PROTONIX ) 40 MG tablet [id number]  Take 40 mg by mouth daily. [provider]  Active Spouse/Significant Other, Self  polyethylene glycol powder (GLYCOLAX /MIRALAX ) 17 GM/SCOOP powder [id number]  Take 17 g by mouth daily.  Patient taking differently: Take 17 g by mouth daily as needed for mild constipation or moderate constipation.   Sheikh, Omair [address], DO  Active Spouse/Significant Other, Self  pravastatin  (PRAVACHOL ) 40 MG tablet [id number]  Take 1 tablet (40 mg total) by mouth every evening. Raford Riggs, MD  Active Spouse/Significant Other, Self  SPIRIVA  RESPIMAT 2.5 MCG/ACT AERS [id number] Yes Inhale 2 puffs into the lungs daily as needed (COPD). [provider]  Active Spouse/Significant Other, Self  spironolactone  (ALDACTONE ) 25 MG tablet [id number]  TAKE 1 TABLET (25 MG TOTAL) BY MOUTH DAILY.  Patient taking differently: Take 12.5 mg by mouth daily.   Vannie Reche RAMAN, NP  Active Spouse/Significant Other, Self  tadalafil (CIALIS) 20 MG tablet [id number]  Take 20 mg by mouth every three (3) days as needed for erectile dysfunction. [provider]  Active Spouse/Significant Other, Self  tamsulosin  (FLOMAX ) 0.4 MG CAPS capsule [id number]  Take 0.4 mg by mouth daily after supper. [provider]  Active Spouse/Significant Other, Self            Recommendation:   Continue Current Plan of Care  Follow Up Plan:   Telephone follow up appointment date/time:  10/12/24 at 10 AM  Rosaline Finlay, RN MSN [place]  [facility] RN Care Manager Direct Dial: [phone]  Fax: [phone]

## 2024-09-21 NOTE — Patient Instructions (Signed)
 Visit Information  Thank you for taking time to visit with me today. Please don't hesitate to contact me if I can be of assistance to you before our next scheduled appointment.  Your next care management appointment is by telephone on 10/12/24 at 10 AM  Please call the care guide team at [phone] if you need to cancel, schedule, or reschedule an appointment.   Please call the Suicide and Crisis Lifeline: 988 call 1-800-273-TALK (toll free, 24 hour hotline) if you are experiencing a Mental Health or Behavioral Health Crisis or need someone to talk to.  Rosaline Finlay, RN MSN [place]  [facility] RN Care Manager Direct Dial: [phone]  Fax: [phone]

## 2024-09-26 DIAGNOSIS — J449 Chronic obstructive pulmonary disease, unspecified: Secondary | ICD-10-CM | POA: Diagnosis not present

## 2024-09-26 DIAGNOSIS — I480 Paroxysmal atrial fibrillation: Secondary | ICD-10-CM | POA: Diagnosis not present

## 2024-09-26 DIAGNOSIS — E782 Mixed hyperlipidemia: Secondary | ICD-10-CM | POA: Diagnosis not present

## 2024-09-26 DIAGNOSIS — I110 Hypertensive heart disease with heart failure: Secondary | ICD-10-CM | POA: Diagnosis not present

## 2024-09-26 DIAGNOSIS — F0390 Unspecified dementia without behavioral disturbance: Secondary | ICD-10-CM | POA: Diagnosis not present

## 2024-09-26 DIAGNOSIS — N400 Enlarged prostate without lower urinary tract symptoms: Secondary | ICD-10-CM | POA: Diagnosis not present

## 2024-09-26 DIAGNOSIS — E1140 Type 2 diabetes mellitus with diabetic neuropathy, unspecified: Secondary | ICD-10-CM | POA: Diagnosis not present

## 2024-09-26 DIAGNOSIS — F319 Bipolar disorder, unspecified: Secondary | ICD-10-CM | POA: Diagnosis not present

## 2024-10-10 ENCOUNTER — Other Ambulatory Visit: Payer: Self-pay | Admitting: Vascular Surgery

## 2024-10-10 DIAGNOSIS — I7143 Infrarenal abdominal aortic aneurysm, without rupture: Secondary | ICD-10-CM

## 2024-10-10 DIAGNOSIS — M79604 Pain in right leg: Secondary | ICD-10-CM

## 2024-10-12 ENCOUNTER — Other Ambulatory Visit: Payer: Self-pay

## 2024-10-12 NOTE — Patient Outreach (Signed)
 Complex Care Management   Visit Note  10/12/2024  Name:  Douglas Hurst. MRN: [id number] DOB: [birth year]  Situation: Referral received for Complex Care Management related to Heart Failure, COPD, and recent hospitalization for GI bleed I obtained verbal consent from patient's wife Douglas Hurst.  Visit completed with Douglas Hurst  on the phone  Background:   Past Medical History:  Diagnosis Date   Bipolar disorder (HCC)    Chronic back pain    Complication of anesthesia    prolonged sedation and confusion   COPD (chronic obstructive pulmonary disease) (HCC)    Dr. signa   Diabetes mellitus without complication [facility])    type 2   Diverticulosis    Dysrhythmia    Afib  onset 03-2022   ED (erectile dysfunction)    Gallstones    GERD (gastroesophageal reflux disease)    Hiatal hernia    Hyperlipidemia    Hypertension    Weakness 03/29/2023   Wears dentures    upper   Wears glasses    Wears hearing aid in both ears     Assessment: Patient Reported Symptoms:  Cognitive Cognitive Status: Able to follow simple commands, Alert and oriented to person, place, and time, Normal speech and language skills Cognitive/Intellectual Conditions Management [RPT]: None reported or documented in medical history or problem list      Neurological Neurological Review of Symptoms: Not assessed    HEENT HEENT Symptoms Reported: Not assessed      Cardiovascular Cardiovascular Symptoms Reported: No symptoms reported Does patient have uncontrolled Hypertension?: Yes Is patient checking Blood Pressure at home?: Yes Patient's Recent BP reading at home: 120/63, heart rate 91 Cardiovascular Management Strategies: Medication therapy, Routine screening, Weight management Do You Have a Working Readable Scale?: Yes Weight: 194 lb (88 kg) (Reported by patient's wife) Cardiovascular Comment: Patient's wife reports this morning's weight was done after breakfast. She reports that last weight prior to  today was 191 lb on 09/29/24. She does report he has been eating more. Reviewed importance of weighing daily, first thing in the morning before eating or drinking. Educated to notify cardiology for weight gain of 2 lb in one day or 5 lb in one week. Confirmed compliance with diuretics. Note per chart review that it has been almost a year since last visit with cardiology and no f/u appointment is scheduled at this time. Provided patient's wife with cardiology office phone number and advised to call to schedule f/u appointment.  Respiratory Respiratory Symptoms Reported: No symptoms reported Respiratory Management Strategies: Coping strategies, Routine screening, Medication therapy  Endocrine Endocrine Symptoms Reported: No symptoms reported Is patient diabetic?: Yes Is patient checking blood sugars at home?: Yes List most recent blood sugar readings, include date and time of day: 108 this morning    Gastrointestinal Gastrointestinal Symptoms Reported: No symptoms reported Additional Gastrointestinal Details: Patient's wife reports patient's stools were a little loose so she gave him Metamucil. She reports Metamucil helped stools thicken up. She denies blood in his stool. Patient's wife does report at least once a day he does have an episode of bowel incontinence. He wears a brief. She notes he has follow-up with GI next week. Paitent's wife reports he is tolerating a regular diet. She does report that he has difficulty swallowing at times, and plans to discuss with GI. Gastrointestinal Management Strategies: Medication therapy, Incontinence garment/pad    Genitourinary Genitourinary Symptoms Reported: No symptoms reported    Integumentary Integumentary Symptoms Reported: No symptoms reported  Musculoskeletal Musculoskelatal Symptoms Reviewed: Not assessed        Psychosocial Psychosocial Symptoms Reported: Not assessed          10/12/2024    PHQ2-9 Depression Screening   Little interest  or pleasure in doing things    Feeling down, depressed, or hopeless    PHQ-2 - Total Score    Trouble falling or staying asleep, or sleeping too much    Feeling tired or having little energy    Poor appetite or overeating     Feeling bad about yourself - or that you are a failure or have let yourself or your family down    Trouble concentrating on things, such as reading the newspaper or watching television    Moving or speaking so slowly that other people could have noticed.  Or the opposite - being so fidgety or restless that you have been moving around a lot more than usual    Thoughts that you would be better off dead, or hurting yourself in some way    PHQ2-9 Total Score    If you checked off any problems, how difficult have these problems made it for you to do your work, take care of things at home, or get along with other people    Depression Interventions/Treatment      There were no vitals filed for this visit.  Medications Reviewed Today     Reviewed by Arno Rosaline SQUIBB, RN (Registered Nurse) on 10/12/24 at 1012  Med List Status: <None>   Medication Order Taking? Sig Documenting Provider Last Dose Status Informant  acetaminophen  (TYLENOL ) 325 MG tablet [id number]  Take 2 tablets (650 mg total) by mouth every 6 (six) hours as needed for mild pain (or Fever >/= 101). Sheikh, Omair [place], DO  Active Spouse/Significant Other, Self  donepezil  (ARICEPT ) 5 MG tablet [id number]  Take 5 mg by mouth at bedtime. [provider]  Active Self, Spouse/Significant Other  ELIQUIS  5 MG TABS tablet [id number]  TAKE 1 TABLET BY MOUTH TWICE A DAY Walker, Caitlin S, NP  Active Spouse/Significant Other, Self  finasteride  (PROSCAR ) 5 MG tablet [id number]  Take 5 mg by mouth daily. [provider]  Active Spouse/Significant Other, Self  furosemide  (LASIX ) 20 MG tablet [id number] Yes Monday, Wednesday, Friday or as directed Lonni Slain, MD  Active   mesalamine (LIALDA) 1.2 g  EC tablet [id number]  Take 1.5 g by mouth daily with breakfast. [provider]  Active   Multiple Vitamin (MULTIVITAMIN WITH MINERALS) TABS tablet [id number]  Take 1 tablet by mouth daily. [provider]  Active Spouse/Significant Other, Self  pantoprazole  (PROTONIX ) 40 MG tablet [id number]  Take 40 mg by mouth daily. [provider]  Active Spouse/Significant Other, Self  polyethylene glycol powder (GLYCOLAX /MIRALAX ) 17 GM/SCOOP powder [id number]  Take 17 g by mouth daily.  Patient taking differently: Take 17 g by mouth daily as needed for mild constipation or moderate constipation.   Sheikh, Omair [place], DO  Active Spouse/Significant Other, Self  pravastatin  (PRAVACHOL ) 40 MG tablet [id number]  Take 1 tablet (40 mg total) by mouth every evening. Raford Riggs, MD  Active Spouse/Significant Other, Self  SPIRIVA  RESPIMAT 2.5 MCG/ACT AERS [id number]  Inhale 2 puffs into the lungs daily as needed (COPD). [provider]  Active Spouse/Significant Other, Self  spironolactone  (ALDACTONE ) 25 MG tablet [id number] Yes TAKE 1 TABLET (25 MG TOTAL) BY MOUTH DAILY.  Patient taking differently: Take 12.5 mg by mouth daily.   Walker, Caitlin S,  NP  Active Spouse/Significant Other, Self  tadalafil (CIALIS) 20 MG tablet [id number]  Take 20 mg by mouth every three (3) days as needed for erectile dysfunction. [provider]  Active Spouse/Significant Other, Self  tamsulosin  (FLOMAX ) 0.4 MG CAPS capsule [id number]  Take 0.4 mg by mouth daily after supper. [provider]  Active Spouse/Significant Other, Self            Recommendation:   Specialty provider follow-up cardiology - patient's wife to call and schedule appointment Continue Current Plan of Care  Follow Up Plan:   Telephone follow up appointment date/time:  11/09/24 at 10 AM  Rosaline Finlay, RN MSN [place]  [facility] RN Care Manager Direct Dial: [phone]  Fax:  [phone]

## 2024-10-12 NOTE — Patient Instructions (Addendum)
 Visit Information  Thank you for taking time to visit with me today. Please don't hesitate to contact me if I can be of assistance to you before our next scheduled appointment.  Your next care management appointment is by telephone on 11/09/24 at 10 AM  Please call the care guide team at [phone] if you need to cancel, schedule, or reschedule an appointment.   Please call the Suicide and Crisis Lifeline: 988 call 1-800-273-TALK (toll free, 24 hour hotline) if you are experiencing a Mental Health or Behavioral Health Crisis or need someone to talk to.  Rosaline Finlay, RN MSN [place]  [facility] RN Care Manager Direct Dial: [phone]  Fax: [phone]  Heart Failure Action Plan A heart failure action plan helps you know what to do when you have symptoms of heart failure. Your action plan is a color-coded plan that lists the symptoms to watch for and indicates what actions to take. If you have symptoms in the green zone, you're doing well. If you have symptoms in the yellow zone, you're having problems. If you have symptoms in the red zone, you need medical care right away. Follow the plan that was created by you and your health care provider. Review your plan each time you visit your provider. Green zone These signs mean you're doing well and can continue what you're doing: You don't have new or worsening shortness of breath. You have very little swelling or no new swelling. Your weight is stable (no gain or loss). You have a normal activity level. You don't have chest pain or any other new symptoms. Yellow zone These signs and symptoms mean your condition may be getting worse and you should make some changes: You have trouble breathing when you're active. You have swelling in your feet or legs or have discomfort in your belly. You gain 2-3 lb (0.9-1.4 kg) in 24 hours, or 5 lb (2.3 kg) in a week. This amount may be more or less depending on your  condition. You get tired easily. You have trouble sleeping. You have a dry cough. If you have any of these symptoms: Contact your provider within the next day. Your provider may adjust your medicines. Red zone These signs and symptoms mean you should get medical help right away: You have trouble breathing when resting or cannot lie flat and you need to raise your head to help you breathe. You have a dry cough that's getting worse. You have swelling or pain in your feet or legs or discomfort in your belly that's getting worse. You suddenly gain more than 2-3 lb (0.9-1.4 kg) in 24 hours, or more than 5 lb (2.3 kg) in a week. This amount may be more or less depending on your condition. You have trouble staying awake or you feel confused. You don't have an appetite. You have worsening sadness or depression. These symptoms may be an emergency. Call 911 right away. Do not wait to see if the symptoms will go away. Do not drive yourself to the hospital. Follow these instructions at home: Take medicines only as told. Eat a heart-healthy diet. Work with a dietitian to create an eating plan that's best for you. Weigh yourself each day.  Call your provider if you gain more than _3_ lb in 24 hours, or more than _5_ lb in a week. Health care provider name: __Dr. Shelda Bruckner ____ Health care provider phone number: ___[phone]____ Where to find more information American Heart Association: heart.org This information is not  intended to replace advice given to you by your health care provider. Make sure you discuss any questions you have with your health care provider. Document Revised: 07/29/2023 Document Reviewed: 07/29/2023 Elsevier Patient Education  2024 [employer].

## 2024-10-16 ENCOUNTER — Other Ambulatory Visit: Payer: Self-pay

## 2024-10-16 DIAGNOSIS — M79604 Pain in right leg: Secondary | ICD-10-CM

## 2024-10-16 DIAGNOSIS — I7143 Infrarenal abdominal aortic aneurysm, without rupture: Secondary | ICD-10-CM

## 2024-10-17 DIAGNOSIS — K529 Noninfective gastroenteritis and colitis, unspecified: Secondary | ICD-10-CM | POA: Diagnosis not present

## 2024-10-27 DIAGNOSIS — E782 Mixed hyperlipidemia: Secondary | ICD-10-CM | POA: Diagnosis not present

## 2024-10-27 DIAGNOSIS — I480 Paroxysmal atrial fibrillation: Secondary | ICD-10-CM | POA: Diagnosis not present

## 2024-10-27 DIAGNOSIS — F0390 Unspecified dementia without behavioral disturbance: Secondary | ICD-10-CM | POA: Diagnosis not present

## 2024-10-27 DIAGNOSIS — I110 Hypertensive heart disease with heart failure: Secondary | ICD-10-CM | POA: Diagnosis not present

## 2024-10-27 DIAGNOSIS — N400 Enlarged prostate without lower urinary tract symptoms: Secondary | ICD-10-CM | POA: Diagnosis not present

## 2024-10-27 DIAGNOSIS — F319 Bipolar disorder, unspecified: Secondary | ICD-10-CM | POA: Diagnosis not present

## 2024-10-27 DIAGNOSIS — J449 Chronic obstructive pulmonary disease, unspecified: Secondary | ICD-10-CM | POA: Diagnosis not present

## 2024-10-27 DIAGNOSIS — E1140 Type 2 diabetes mellitus with diabetic neuropathy, unspecified: Secondary | ICD-10-CM | POA: Diagnosis not present

## 2024-10-31 DIAGNOSIS — J449 Chronic obstructive pulmonary disease, unspecified: Secondary | ICD-10-CM | POA: Diagnosis not present

## 2024-10-31 DIAGNOSIS — E1140 Type 2 diabetes mellitus with diabetic neuropathy, unspecified: Secondary | ICD-10-CM | POA: Diagnosis not present

## 2024-10-31 DIAGNOSIS — I480 Paroxysmal atrial fibrillation: Secondary | ICD-10-CM | POA: Diagnosis not present

## 2024-10-31 DIAGNOSIS — I110 Hypertensive heart disease with heart failure: Secondary | ICD-10-CM | POA: Diagnosis not present

## 2024-11-01 ENCOUNTER — Ambulatory Visit (HOSPITAL_BASED_OUTPATIENT_CLINIC_OR_DEPARTMENT_OTHER)
Admission: RE | Admit: 2024-11-01 | Discharge: 2024-11-01 | Disposition: A | Source: Ambulatory Visit | Attending: Vascular Surgery

## 2024-11-01 ENCOUNTER — Ambulatory Visit (HOSPITAL_COMMUNITY)
Admission: RE | Admit: 2024-11-01 | Discharge: 2024-11-01 | Disposition: A | Discharge status: Home / Self Care | Source: Ambulatory Visit | Attending: Vascular Surgery | Admitting: Vascular Surgery

## 2024-11-01 ENCOUNTER — Ambulatory Visit: Admitting: Physician Assistant

## 2024-11-01 ENCOUNTER — Ambulatory Visit (HOSPITAL_BASED_OUTPATIENT_CLINIC_OR_DEPARTMENT_OTHER)
Admission: RE | Admit: 2024-11-01 | Discharge: 2024-11-01 | Disposition: A | Discharge status: Home / Self Care | Source: Ambulatory Visit | Attending: Vascular Surgery | Admitting: Vascular Surgery

## 2024-11-01 ENCOUNTER — Encounter: Payer: Self-pay | Admitting: Physician Assistant

## 2024-11-01 VITALS — BP 138/73 | HR 98 | Temp 97.7°F | Wt 191.6 lb

## 2024-11-01 DIAGNOSIS — M79604 Pain in right leg: Secondary | ICD-10-CM | POA: Insufficient documentation

## 2024-11-01 DIAGNOSIS — I739 Peripheral vascular disease, unspecified: Secondary | ICD-10-CM | POA: Diagnosis not present

## 2024-11-01 DIAGNOSIS — M79605 Pain in left leg: Secondary | ICD-10-CM | POA: Insufficient documentation

## 2024-11-01 DIAGNOSIS — I7143 Infrarenal abdominal aortic aneurysm, without rupture: Secondary | ICD-10-CM

## 2024-11-01 LAB — VAS US ABI WITH/WO TBI
Left ABI: 1
Right ABI: 1.05

## 2024-11-01 NOTE — Progress Notes (Signed)
 Office Note   History of Present Illness   Douglas Hurst. is a 83 y.o. (03/23/1941) male who presents for follow up.  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  He also has a history of PAD without prior vascular inventions.  He returns today for follow-up.  He has no complaints at today's office visit.  He denies any claudication, rest pain, or tissue loss.  He also denies any new or worsening abdominal or back pain.  He takes Eliquis  and pravastatin  daily. Current Outpatient Medications  Medication Sig Dispense Refill   acetaminophen  (TYLENOL ) 325 MG tablet Take 2 tablets (650 mg total) by mouth every 6 (six) hours as needed for mild pain (or Fever >/= 101). 20 tablet 0   donepezil  (ARICEPT ) 5 MG tablet Take 5 mg by mouth at bedtime.     ELIQUIS  5 MG TABS tablet TAKE 1 TABLET BY MOUTH TWICE A DAY 180 tablet 3   finasteride  (PROSCAR ) 5 MG tablet Take 5 mg by mouth daily.     furosemide  (LASIX ) 20 MG tablet Monday, Wednesday, Friday or as directed 40 tablet 3   mesalamine (LIALDA) 1.2 g EC tablet Take 1.5 g by mouth daily with breakfast.     Multiple Vitamin (MULTIVITAMIN WITH MINERALS) TABS tablet Take 1 tablet by mouth daily.     pantoprazole  (PROTONIX ) 40 MG tablet Take 40 mg by mouth daily.     polyethylene glycol powder (GLYCOLAX /MIRALAX ) 17 GM/SCOOP powder Take 17 g by mouth daily. (Patient taking differently: Take 17 g by mouth daily as needed for mild constipation or moderate constipation.) 238 g 0   pravastatin  (PRAVACHOL ) 40 MG tablet Take 1 tablet (40 mg total) by mouth every evening. 90 tablet 2   predniSONE  (DELTASONE ) 10 MG tablet 1 tablet with food or milk Orally Once a day; Duration: 30 days     SPIRIVA  RESPIMAT 2.5 MCG/ACT AERS Inhale 2 puffs into the lungs daily as needed (COPD).     spironolactone  (ALDACTONE ) 25 MG tablet TAKE 1 TABLET (25 MG TOTAL) BY MOUTH DAILY. (Patient taking differently: Take 12.5 mg by mouth daily.) 90 tablet 3    tadalafil (CIALIS) 20 MG tablet Take 20 mg by mouth every three (3) days as needed for erectile dysfunction.     tamsulosin  (FLOMAX ) 0.4 MG CAPS capsule Take 0.4 mg by mouth daily after supper.     No current facility-administered medications for this visit.    REVIEW OF SYSTEMS (negative unless checked):   Cardiac:  []  Chest pain or chest pressure? []  Shortness of breath upon activity? []  Shortness of breath when lying flat? []  Irregular heart rhythm?  Vascular:  []  Pain in calf, thigh, or hip brought on by walking? []  Pain in feet at night that wakes you up from your sleep? []  Blood clot in your veins? []  Leg swelling?  Pulmonary:  []  Oxygen at home? []  Productive cough? []  Wheezing?  Neurologic:  []  Sudden weakness in arms or legs? []  Sudden numbness in arms or legs? []  Sudden onset of difficult speaking or slurred speech? []  Temporary loss of vision in one eye? []  Problems with dizziness?  Gastrointestinal:  []  Blood in stool? []  Vomited blood?  Genitourinary:  []  Burning when urinating? []  Blood in urine?  Psychiatric:  []  Major depression  Hematologic:  []  Bleeding problems? []  Problems with blood clotting?  Dermatologic:  []  Rashes or ulcers?  Constitutional:  []  Fever or chills?  Ear/Nose/Throat:  []  Change in hearing? []  Nose bleeds? []  Sore throat?  Musculoskeletal:  []  Back pain? []  Joint pain? []  Muscle pain?   Physical Examination   Vitals:   11/01/24 1224  BP: 138/73  Pulse: 98  Temp: 97.7 F (36.5 C)  TempSrc: Temporal  Weight: 191 lb 9.6 oz (86.9 kg)   There is no height or weight on file to calculate BMI.  General:  WDWN in NAD; vital signs documented above Gait: Not observed HENT: WNL, normocephalic Pulmonary: normal non-labored breathing Cardiac: Regular Abdomen: soft, NT, no masses Skin: without rashes Vascular Exam/Pulses: Brisk DP/PT Doppler signals bilaterally Extremities: without ischemic changes, without  gangrene , without cellulitis; without open wounds;  Musculoskeletal: no muscle wasting or atrophy  Neurologic: A&O X 3;  No focal weakness or paresthesias are detected Psychiatric:  The pt has Normal affect.   Non-Invasive Vascular Imaging   AAA Duplex (11/01/2024) Current size: 4.53 cm Previous size: 4.5 cm (04/19/2024) R CIA: 2.2 cm L CIA: 1.6 cm  BLE Arterial Duplex (11/01/2024) Negative for bilateral popliteal artery aneurysm  ABI (11/01/2024) +---------+------------------+-----+--------+--------+  Right   Rt Pressure (mmHg)IndexWaveformComment   +---------+------------------+-----+--------+--------+  Brachial 135                                      +---------+------------------+-----+--------+--------+  PTA     147               1.05 biphasic          +---------+------------------+-----+--------+--------+  DP      144               1.03 biphasic          +---------+------------------+-----+--------+--------+  Great Toe76                0.54 Normal            +---------+------------------+-----+--------+--------+   +---------+------------------+-----+-------------------+-------+  Left    Lt Pressure (mmHg)IndexWaveform           Comment  +---------+------------------+-----+-------------------+-------+  Brachial 140                                                +---------+------------------+-----+-------------------+-------+  PTA     140               1.00 biphasic                    +---------+------------------+-----+-------------------+-------+  DP      118               0.84 dampened monophasic         +---------+------------------+-----+-------------------+-------+  Great Toe80                0.57 Normal                      +---------+------------------+-----+-------------------+-------+   +-------+-----------+-----------+------------+------------+  ABI/TBIToday's ABIToday's TBIPrevious ABIPrevious  TBI  +-------+-----------+-----------+------------+------------+  Right 1.05       0.54       0.99        0.58          +-------+-----------+-----------+------------+------------+  Left  1.00       0.57       0.99  0.7           +-------+-----------+-----------+------------+------------+    Medical Decision Making   Mavin Dyke. is a 83 y.o. (1941/12/28) male who presents for follow-up  Based on this patient's duplex, his AAA is stable in size at 4.53 cm.  He denies any new or worsening abdominal or back pain Bilateral lower extremity arterial duplex is negative for popliteal aneurysm His ABIs today are also stable from his last visit.  His right ABI is 1.05 and left ABI is 1.0 He denies any claudication, rest pain, or tissue loss.  He has brisk DP/PT Doppler signals bilaterally He will continue his Eliquis  and statin and follow-up with our office in 6 months with repeat AAA duplex   Ahmed Holster PA-C [facility] Office: [phone]  Clinic MD: Sheree

## 2024-11-02 ENCOUNTER — Other Ambulatory Visit: Payer: Self-pay | Admitting: *Deleted

## 2024-11-02 DIAGNOSIS — I7143 Infrarenal abdominal aortic aneurysm, without rupture: Secondary | ICD-10-CM

## 2024-11-06 ENCOUNTER — Ambulatory Visit: Admitting: Adult Health

## 2024-11-06 ENCOUNTER — Encounter: Payer: Self-pay | Admitting: Adult Health

## 2024-11-06 VITALS — BP 123/70 | HR 85 | Ht 73.0 in | Wt 195.0 lb

## 2024-11-06 DIAGNOSIS — G309 Alzheimer's disease, unspecified: Secondary | ICD-10-CM

## 2024-11-06 DIAGNOSIS — G3184 Mild cognitive impairment, so stated: Secondary | ICD-10-CM

## 2024-11-06 NOTE — Patient Instructions (Addendum)
 Your Plan:  Continue Aricept  5mg  nightly   Please look into Alzheimer's association for further information regarding support groups     Follow up as needed at this time     Thank you for coming to see us  at [facility]. I hope we have been able to provide you high quality care today.  You may receive a patient satisfaction survey over the next few weeks. We would appreciate your feedback and comments so that we may continue to improve ourselves and the health of our patients.

## 2024-11-06 NOTE — Progress Notes (Signed)
 [facility] [address] [place]. [place] [address] [phone]       OFFICE FOLLOW UP NOTE  [name]. Date of Birth:  Aug 08, 1941 Medical Record Number:  [id number]    Primary neurologist: Dr. Chalice Reason for visit: Cognitive impairment    SUBJECTIVE:  CHIEF COMPLAINT:  Chief Complaint  Patient presents with   Follow-up    Pt in 8 with wife Pt here for memory f/u wife states memory same since last visit Wife states pt pcp changed Aricept  to 5mg  daily due to some GI issues  Pt admitted to hospital August  2025      HPI:   Update 11/06/2024 JM: Patient returns for follow-up visit accompanied by his wife.  Reports overall memory has been stable since prior visit without any noticeable decline.  He continues to maintain ADLs independently.  He has since returned back to driving only short distance and has not had any issues with this.  Ambulates with a cane, no recent falls.  Reports good appetite and sleeps well.  He continues on Aricept  5 mg nightly, was increased at prior visit to 10mg , started to have GI symptoms, PCP lowered dose back down.  He was eventually diagnosed with UC after hospitalization in August for lower GI bleed, he is closely being followed by GI currently on prednisone  and spironolactone .  Wife does mention that he is short tempered with her which can be difficult to deal with, especially when he is fine with other family and friends. He can easily get frustrated when she is telling him to do something. He denies feeling of anxiety or depression.  No other behavioral concerns.       History provided for reference purposes only Update 04/06/2024 JM: Patient returns for 47-month follow-up visit accompanied by his wife.  Both he and his wife feel like his memory has improved since the prior visit, wife mentions his overall communication and wanting to be more active has also improved. MMSE 19/30 (prior 20/30). She feels his gait  has been better and more stable since working with PT, no recent falls. He can occasionally become agitated with wife but more so when he is questioned on something he is doing.  Reports he sleeps well and has a good appetite.  He questions being able to return to driving, he was driving previously without any difficulty.  He has primarily wanting to drive just short distance to resume having breakfast with his friends locally.  Even prior, he was not driving any long distance and avoided nighttime driving.  ATN profile consistent with Alzheimer's disease  MRI brain showed mild generalized cortical atrophy, moderate microvascular ischemic changes and chronic lacunar infarcts.   EEG impaired validity by motion artifact but no epileptiform discharges   Update 09/14/2023 Dr. Chalice: Douglas LITTIE [name]. is a 83 y.o. male patient who is here for revisit 09/14/2023 for  memory check up.    Chief concern according to patient :  He is not very conversational, but has gained some weight and has made progress in ambulation with PT.  He  is not in pain. His MMSE today was 20/ 30 points.   Consult visit 04/14/2019 for Dr. Chalice: Douglas LITTIE [name]. is a 83 y.o. male and seen here in the presence of spouse and niece -upon referral from Dr. Charlott for a Consultation/ Evaluation of Dementia. Douglas Hurst, [place]. was seen here today in a first consultation dedicated to evaluate  him for possible dementia.  Today is 14 April 2023 and a large part of our conversation today was to establish the history of present illness.  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 in April 2023 this all changed and he became severely ill.   The follow-up care included physical therapy and some reduction in medication.    I was very happy to hear that this  gentleman 14 days ago was still bedridden and now can walk with a walker.  I do wonder if the discontinuation of amiodarone  would have made this quick recovery possible but it is not impossible that amiodarone  is the main culprit for some of his progressed physical ailments.  He also is no longer on metoprolol  and on statins and he can now eat regular food and keep it down. He remains on Eloquis.  Douglas Hurst is a left handed individual with worsening cognitive decline over 12-18 months , and with physical decline as well.   Douglas Steenson. is a 83 y.o. male and seen here in the presence of spouse and niece -upon referral from Dr. Charlott for a Consultation/ Evaluation of Dementia. Douglas Hurst is a left handed individual with worsening cognitive decline over 12-18 months , and with physical decline as well.   The more recent health history for Douglas Hurst is important in the context of his cognitive and physical developments.  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 he was never that great with numbers it seems but he was very much in tune with his daily appointments date fully oriented until April 2023.  In April 2023 he had a fainting spell at home he collapsed, EMS had to bring him to the local hospital , and gallstones were diagnosed. He was discharged after unsuccessfully attempting the removal.  He went to [facility] to rehabilitation for 14 days, but there were mix ups with his medication.    He still had gallbladder obstruction, the gallstones were not successfully removed and [facility].  He was transferred to use and see [facility].  There the stones were removed but not the gallbladder -He also developed sepsis and pancreatitis as the pancreas was injured , and with this peritonitis he developed atrial fibrillation.  He was transferred back to [facility]  and here the cholecystectomy was performed , the  peritonitis was discovered.   He was very ill and had to be fed through a feeding tube.   This time  transfer [facility].    He lost a significant amount of weight, he became more tremulous and his cognition has declined rapidly as well as his physical stamina.  Today's Montreal cognitive assessment test could not be completed.  We also obtained a Mini-Mental status examination and he scored 12 out of 30 points which is very low.  I am not quite sure that this is his true baseline most recently some changes had been made to medication and he also received a new diagnosis of anemia.  This diagnoses apparently has not been shared with his wife for him.  His daily living activities are fairly impaired he does travel by taxi or automobile with assistance of  others, he does not do laundry he can dial a few well-known numbers but does usually not operate a smart phone.  He needs to be accompanied on any shopping trips.  Meals have to be prepared and served, he does not participate in any housekeeping tasks, and he is incapable of dispensing his own medication or handling his own financial affairs. His wife stated she has always handled these things      ROS:   14 system review of systems performed and negative with exception of those listed in HPI  PMH:  Past Medical History:  Diagnosis Date   Bipolar disorder (HCC)    Chronic back pain    Complication of anesthesia    prolonged sedation and confusion   COPD (chronic obstructive pulmonary disease) (HCC)    Dr. signa   Diabetes mellitus without complication (HCC)    type 2   Diverticulosis    Dysrhythmia    Afib  onset 03-2022   ED (erectile dysfunction)    Gallstones    GERD (gastroesophageal reflux disease)    Hiatal hernia    Hyperlipidemia    Hypertension    Weakness 03/29/2023   Wears dentures    upper   Wears glasses    Wears hearing aid in both ears     PSH:   Past Surgical History:  Procedure Laterality Date   BALLOON DILATION N/A 10/01/2015   Procedure: BALLOON DILATION;  Surgeon: Gladis MARLA Louder, MD;  Location: [facility];  Service: Endoscopy;  Laterality: N/A;   CATARACT EXTRACTION, BILATERAL     CHOLECYSTECTOMY N/A 07/08/2022   Procedure: LAPAROSCOPIC CHOLECYSTECTOMY WITH INTRAOPERATIVE CHOLANGIOGRAM AND LAPAROSCOPIC COMMON DUCT EXPLORATION;  Surgeon: Lyndel Deward PARAS, MD;  Location: [facility];  Service: General;  Laterality: N/A;   COLONOSCOPY     removed polyps   COLONOSCOPY N/A 08/10/2024   Procedure: COLONOSCOPY;  Surgeon: Elicia Claw, MD;  Location: [facility];  Service: Gastroenterology;  Laterality: N/A;   COLONOSCOPY WITH PROPOFOL  N/A 09/01/2016   Procedure: COLONOSCOPY WITH PROPOFOL ;  Surgeon: Gladis MARLA Louder, MD;  Location: [facility];  Service: Endoscopy;  Laterality: N/A;   ERCP N/A 07/09/2022   Procedure: ENDOSCOPIC RETROGRADE CHOLANGIOPANCREATOGRAPHY (ERCP);  Surgeon: Rosalie Kitchens, MD;  Location: [facility];  Service: Gastroenterology;  Laterality: N/A;   ESOPHAGEAL DILATION  06/23/2022   Procedure: PYLORIC  DILATION;  Surgeon: Rosalie Kitchens, MD;  Location: [facility];  Service: Gastroenterology;;   ESOPHAGOGASTRODUODENOSCOPY N/A 06/23/2022   Procedure: ESOPHAGOGASTRODUODENOSCOPY (EGD);  Surgeon: Rosalie Kitchens, MD;  Location: [facility];  Service: Gastroenterology;  Laterality: N/A;   ESOPHAGOGASTRODUODENOSCOPY N/A 08/10/2024   Procedure: EGD (ESOPHAGOGASTRODUODENOSCOPY) with possible dilation;  Surgeon: Elicia Claw, MD;  Location: [facility];  Service: Gastroenterology;  Laterality: N/A;   ESOPHAGOGASTRODUODENOSCOPY (EGD) WITH PROPOFOL  N/A 10/01/2015   Procedure: ESOPHAGOGASTRODUODENOSCOPY (EGD) WITH PROPOFOL ;  Surgeon: Gladis MARLA Louder, MD;  Location: [facility];  Service: Endoscopy;  Laterality: N/A;   FISTULOTOMY  07/09/2022   Procedure: FISTULOTOMY;  Surgeon: Rosalie Kitchens, MD;  Location: [facility];   Service: Gastroenterology;;   HEMOSTASIS CLIP PLACEMENT  06/23/2022   Procedure: HEMOSTASIS CLIP PLACEMENT;  Surgeon: Rosalie Kitchens, MD;  Location: [facility];  Service: Gastroenterology;;   IR CATHETER TUBE CHANGE  08/20/2022   IR EXCHANGE BILIARY DRAIN  05/15/2022   IR EXCHANGE BILIARY DRAIN  05/26/2022   IR EXCHANGE BILIARY DRAIN  06/11/2022   IR EXCHANGE BILIARY DRAIN  06/23/2022   IR PERC CHOLECYSTOSTOMY  04/26/2022   IR RADIOLOGIST EVAL &  MGMT  09/16/2022   KNEE ARTHROSCOPY     TONSILLECTOMY      Social History:  Social History   Socioeconomic History   Marital status: Married    Spouse name: Not on file   Number of children: Not on file   Years of education: Not on file   Highest education level: Not on file  Occupational History   Not on file  Tobacco Use   Smoking status: Former    Types: Cigarettes   Smokeless tobacco: Never  Vaping Use   Vaping status: Never Used  Substance and Sexual Activity   Alcohol use: No   Drug use: No   Sexual activity: Not Currently  Other Topics Concern   Not on file  Social History Narrative   Not on file   Social Drivers of Health   Financial Resource Strain: Not on file  Food Insecurity: No Food Insecurity (08/31/2024)   Hunger Vital Sign    Worried About Running Out of Food in the Last Year: Never true    Ran Out of Food in the Last Year: Never true  Transportation Needs: No Transportation Needs (08/31/2024)   PRAPARE - Administrator, Civil Service (Medical): No    Lack of Transportation (Non-Medical): No  Physical Activity: Not on file  Stress: Not on file  Social Connections: Socially Integrated (08/08/2024)   Social Connection and Isolation Panel    Frequency of Communication with Friends and Family: More than three times a week    Frequency of Social Gatherings with Friends and Family: More than three times a week    Attends Religious Services: More than 4 times per year    Active Member of [employer] or Organizations:  No    Attends Engineer, Structural: More than 4 times per year    Marital Status: Married  Catering Manager Violence: Not At Risk (08/31/2024)   Humiliation, Afraid, Rape, and Kick questionnaire    Fear of Current or Ex-Partner: No    Emotionally Abused: No    Physically Abused: No    Sexually Abused: No    Family History:  Family History  Problem Relation Age of Onset   Heart attack Mother    Cancer - Colon Mother    CAD Mother    Cancer Mother    Dementia Mother    CAD Father    Cancer Father    CVA Father    Dementia Father    Heart attack Brother    CAD Brother     Medications:   Current Outpatient Medications on File Prior to Visit  Medication Sig Dispense Refill   acetaminophen  (TYLENOL ) 325 MG tablet Take 2 tablets (650 mg total) by mouth every 6 (six) hours as needed for mild pain (or Fever >/= 101). 20 tablet 0   donepezil  (ARICEPT ) 5 MG tablet Take 5 mg by mouth at bedtime.     ELIQUIS  5 MG TABS tablet TAKE 1 TABLET BY MOUTH TWICE A DAY 180 tablet 3   finasteride  (PROSCAR ) 5 MG tablet Take 5 mg by mouth daily.     furosemide  (LASIX ) 20 MG tablet Monday, Wednesday, Friday or as directed 40 tablet 3   mesalamine (LIALDA) 1.2 g EC tablet Take 1.5 g by mouth daily with breakfast.     Multiple Vitamin (MULTIVITAMIN WITH MINERALS) TABS tablet Take 1 tablet by mouth daily.     pantoprazole  (PROTONIX ) 40 MG tablet Take 40 mg by mouth daily.  polyethylene glycol powder (GLYCOLAX /MIRALAX ) 17 GM/SCOOP powder Take 17 g by mouth daily. (Patient taking differently: Take 17 g by mouth daily as needed for mild constipation or moderate constipation.) 238 g 0   pravastatin  (PRAVACHOL ) 40 MG tablet Take 1 tablet (40 mg total) by mouth every evening. 90 tablet 2   predniSONE  (DELTASONE ) 10 MG tablet 1 tablet with food or milk Orally Once a day; Duration: 30 days     SPIRIVA  RESPIMAT 2.5 MCG/ACT AERS Inhale 2 puffs into the lungs daily as needed (COPD).     spironolactone   (ALDACTONE ) 25 MG tablet TAKE 1 TABLET (25 MG TOTAL) BY MOUTH DAILY. (Patient taking differently: Take 12.5 mg by mouth daily.) 90 tablet 3   tadalafil (CIALIS) 20 MG tablet Take 20 mg by mouth every three (3) days as needed for erectile dysfunction.     tamsulosin  (FLOMAX ) 0.4 MG CAPS capsule Take 0.4 mg by mouth daily after supper.     No current facility-administered medications on file prior to visit.    Allergies:  No Known Allergies    OBJECTIVE:  Physical Exam  Vitals:   11/06/24 1100  BP: 123/70  Pulse: 85  SpO2: 98%  Weight: 195 lb (88.5 kg)  Height: 6' 1 (1.854 m)   Body mass index is 25.73 kg/m. No results found.  General: well developed, well nourished, pleasant elderly Caucasian male, seated, in no evident distress  Neurologic Exam Mental Status: Awake and fully alert. Oriented to place and time. Recent and remote memory intact. Attention span, concentration and fund of knowledge appropriate. Mood and affect appropriate.  Cranial Nerves: Pupils equal, briskly reactive to light. Extraocular movements full without nystagmus. Visual fields full to confrontation. Hearing intact. Facial sensation intact. Face, tongue, palate moves normally and symmetrically.  Motor: Normal bulk and tone. Normal strength in all tested extremity muscles.  Mild postural tremor in upper extremities. No evidence of resting tremor.  Sensory.: intact to touch , pinprick , position and vibratory sensation.  Coordination: Rapid alternating movements slightly decreased in upper extremities. Gait and Station: Arises from chair without difficulty. Stance is slightly hunched. Gait demonstrates decreased stride length and step height with use of cane.  Tandem walk and heel toe not attempted Reflexes: 1+ and symmetric. Toes downgoing.      11/06/2024   11:03 AM 04/06/2024   10:59 AM 09/14/2023    9:53 AM  MMSE - Mini Mental State Exam  Orientation to time 3 2 1   Orientation to Place 4 3 4    Registration 3 3 3   Attention/ Calculation 0 2 3  Recall 2 1 1   Language- name 2 objects 2 2 2   Language- repeat 1 1 1   Language- follow 3 step command 3 3 3   Language- read & follow direction 1 1 1   Write a sentence 1 1 1   Copy design 0 0 0  Total score 20 19 20          ASSESSMENT/PLAN: Douglas Doughten. is a 83 y.o. year old male     Mild cognitive impairment due to Alzheimer's disease:  MMSE today 20/30 (prior 19/30) Continue donepezil  5 mg nightly, GI side effects on 10mg  dosing but suspect this was more due to UC, as cognition overall stable will keep with 5mg  dosing. Rx recently filled by PCP Having some mood changes more so with his wife. Discussed ways to help avoid increased frustration and ensure she allows him to have some independence as long as safety permits.  She was encouraged to look into different dementia support groups ATN profile consistent with Alzheimer's disease MRI brain mild to moderate generalized cortical atrophy, moderate microvascular ischemic changes and chronic lacunar infarcts EEG no epileptiform discharges     Per patient preference, he can return back to PCP for ongoing monitoring and management.  Advised to call with any questions or concerns in the future   CC:  PCP: Charlott Dorn LABOR, MD    I personally spent a total of 35 minutes in the care of the patient today including preparing to see the patient, performing a medically appropriate exam/evaluation, counseling and educating, and documenting clinical information in the EHR.   Harlene Bogaert, AGNP-BC  [facility] [address] [place], [place] [address]  Phone [phone] Fax [phone] Note: This document was prepared with digital dictation and possible smart phrase technology. Any transcriptional errors that result from this process are unintentional.

## 2024-11-09 ENCOUNTER — Other Ambulatory Visit: Payer: Self-pay

## 2024-11-09 NOTE — Patient Instructions (Signed)
 Zachary LITTIE [name]. - I am sorry I was unable to reach you today for our scheduled appointment. I work with Charlott, Dorn LABOR, MD and am calling to support your healthcare needs. Please contact me at [phone] at your earliest convenience. I look forward to speaking with you soon.   Thank you,  Rosaline Finlay, RN MSN [place]  [facility] RN Care Manager Direct Dial: [phone]  Fax: [phone]

## 2024-11-09 NOTE — Patient Outreach (Signed)
 Complex Care Management   Visit Note  11/09/2024  Name:  Douglas Hurst. MRN: [id number] DOB: 1941-01-10  Situation: Referral received for Complex Care Management related to Heart Failure and COPD I obtained verbal consent from patient's wife.  Visit completed with patient's wife Douglas Hurst  on the phone  Background:   Past Medical History:  Diagnosis Date   Bipolar disorder (HCC)    Chronic back pain    Complication of anesthesia    prolonged sedation and confusion   COPD (chronic obstructive pulmonary disease) (HCC)    Dr. signa   Diabetes mellitus without complication (HCC)    type 2   Diverticulosis    Dysrhythmia    Afib  onset 03-2022   ED (erectile dysfunction)    Gallstones    GERD (gastroesophageal reflux disease)    Hiatal hernia    Hyperlipidemia    Hypertension    Weakness 03/29/2023   Wears dentures    upper   Wears glasses    Wears hearing aid in both ears     Assessment: Patient Reported Symptoms:  Cognitive Cognitive Status: Able to follow simple commands, Alert and oriented to person, place, and time, Normal speech and language skills Cognitive/Intellectual Conditions Management [RPT]: None reported or documented in medical history or problem list   Health Maintenance Behaviors: Annual physical exam, Healthy diet Health Facilitated by: Healthy diet, Rest  Neurological Neurological Review of Symptoms: No symptoms reported Neurological Management Strategies: Coping strategies, Medication therapy, Routine screening Neurological Comment: Per chart review, patient has had follow-up with neurology since previous CMRN visit and no changes were made. Patient will follow with PCP moving forward  HEENT HEENT Symptoms Reported: No symptoms reported HEENT Management Strategies: Routine screening, Medical device    Cardiovascular Cardiovascular Symptoms Reported: No symptoms reported Does patient have uncontrolled Hypertension?: Yes Is patient checking  Blood Pressure at home?: Yes Patient's Recent BP reading at home: 145/68, 130/61 Cardiovascular Management Strategies: Medication therapy, Routine screening, Weight management Do You Have a Working Readable Scale?: Yes Weight: 195 lb (88.5 kg) (Reported by patient's wife) Cardiovascular Comment: Note per chart review patient is scheduled with cardiology 01/02/24  Respiratory Respiratory Symptoms Reported: No symptoms reported Respiratory Management Strategies: Coping strategies, Routine screening, Medication therapy  Endocrine Endocrine Symptoms Reported: No symptoms reported Is patient diabetic?: Yes Is patient checking blood sugars at home?: Yes List most recent blood sugar readings, include date and time of day: 108 on 10/29/24 Endocrine Comment: Patient's wife reports she has not checked blood sugar this week. Advised that prednisone  can increase blood sugar. She reports she will begin checking twice a week  Gastrointestinal Gastrointestinal Symptoms Reported: Incontinence, Other Other Gastrointestinal Symptoms: Patient's wife reports he is doing better swallowing, not having as much difficulty. They did discuss this with GI Additional Gastrointestinal Details: Patient has had GI visit since previous CMRN visit. Patient's wife reports they have put him on Prednisone  daily, which patient has been compliant with. Patient's wife reports his stools are not quite as loose. She reoprts he still has some difficulty holding it, but is not having as many episodes of incontinence. She denies blood in the stools Gastrointestinal Management Strategies: Medication therapy, Incontinence garment/pad    Genitourinary Genitourinary Symptoms Reported: No symptoms reported    Integumentary Integumentary Symptoms Reported: No symptoms reported    Musculoskeletal Musculoskelatal Symptoms Reviewed: Unsteady gait Additional Musculoskeletal Details: Patient's wife reports patient has not been complaining of  pain Musculoskeletal Management Strategies: Medical device Elene)  Musculoskeletal Comment: Patient's wife denies falls since previous CMRN visit Falls in the past year?: No Number of falls in past year: 1 or less Was there an injury with Fall?: No Fall Risk Category Calculator: 0 Patient Fall Risk Level: Low Fall Risk Patient at Risk for Falls Due to: Impaired balance/gait Fall risk Follow up: Falls evaluation completed, Education provided, Falls prevention discussed  Psychosocial Psychosocial Symptoms Reported: No symptoms reported Additional Psychological Details: Patient's wife denies concerns related to depression, he may have a little bit of anxiety due to wanting to stay busy          11/09/2024    PHQ2-9 Depression Screening   Little interest or pleasure in doing things    Feeling down, depressed, or hopeless    PHQ-2 - Total Score    Trouble falling or staying asleep, or sleeping too much    Feeling tired or having little energy    Poor appetite or overeating     Feeling bad about yourself - or that you are a failure or have let yourself or your family down    Trouble concentrating on things, such as reading the newspaper or watching television    Moving or speaking so slowly that other people could have noticed.  Or the opposite - being so fidgety or restless that you have been moving around a lot more than usual    Thoughts that you would be better off dead, or hurting yourself in some way    PHQ2-9 Total Score    If you checked off any problems, how difficult have these problems made it for you to do your work, take care of things at home, or get along with other people    Depression Interventions/Treatment      There were no vitals filed for this visit.    Medications Reviewed Today     Reviewed by Arno Rosaline SQUIBB, RN (Registered Nurse) on 11/09/24 at 1615  Med List Status: <None>   Medication Order Taking? Sig Documenting Provider Last Dose Status  Informant  acetaminophen  (TYLENOL ) 325 MG tablet [id number]  Take 2 tablets (650 mg total) by mouth every 6 (six) hours as needed for mild pain (or Fever >/= 101). Sheikh, Omair [place], DO  Active Spouse/Significant Other, Self  donepezil  (ARICEPT ) 5 MG tablet [id number]  Take 5 mg by mouth at bedtime. [provider]  Active Self, Spouse/Significant Other  ELIQUIS  5 MG TABS tablet [id number]  TAKE 1 TABLET BY MOUTH TWICE A DAY Walker, Caitlin S, NP  Active Spouse/Significant Other, Self  finasteride  (PROSCAR ) 5 MG tablet [id number]  Take 5 mg by mouth daily. [provider]  Active Spouse/Significant Other, Self  furosemide  (LASIX ) 20 MG tablet [id number]  Monday, Wednesday, Friday or as directed Lonni Slain, MD  Active   mesalamine (LIALDA) 1.2 g EC tablet [id number] Yes Take 1.5 g by mouth daily with breakfast. [provider]  Active   Multiple Vitamin (MULTIVITAMIN WITH MINERALS) TABS tablet [id number]  Take 1 tablet by mouth daily. [provider]  Active Spouse/Significant Other, Self  pantoprazole  (PROTONIX ) 40 MG tablet [id number]  Take 40 mg by mouth daily. [provider]  Active Spouse/Significant Other, Self  polyethylene glycol powder (GLYCOLAX /MIRALAX ) 17 GM/SCOOP powder [id number]  Take 17 g by mouth daily.  Patient taking differently: Take 17 g by mouth daily as needed for mild constipation or moderate constipation.   Sheikh, Omair [place], DO  Active Spouse/Significant Other, Self  pravastatin  (PRAVACHOL ) 40 MG  tablet [id number]  Take 1 tablet (40 mg total) by mouth every evening. Raford Riggs, MD  Active Spouse/Significant Other, Self  predniSONE  (DELTASONE ) 10 MG tablet [id number] Yes 1 tablet with food or milk Orally Once a day; Duration: 30 days [provider]  Active   SPIRIVA  RESPIMAT 2.5 MCG/ACT AERS [id number]  Inhale 2 puffs into the lungs daily as needed (COPD). [provider]  Active Spouse/Significant Other,  Self  spironolactone  (ALDACTONE ) 25 MG tablet [id number]  TAKE 1 TABLET (25 MG TOTAL) BY MOUTH DAILY.  Patient taking differently: Take 12.5 mg by mouth daily.   Vannie Reche RAMAN, NP  Active Spouse/Significant Other, Self  tadalafil (CIALIS) 20 MG tablet [id number]  Take 20 mg by mouth every three (3) days as needed for erectile dysfunction. [provider]  Active Spouse/Significant Other, Self  tamsulosin  (FLOMAX ) 0.4 MG CAPS capsule [id number]  Take 0.4 mg by mouth daily after supper. [provider]  Active Spouse/Significant Other, Self            Recommendation:   Continue Current Plan of Care  Follow Up Plan:   Telephone follow up appointment date/time:  12/07/24 at 1 PM  Rosaline Finlay, RN MSN [place]  [facility] RN Care Manager Direct Dial: [phone]  Fax: [phone]

## 2024-11-09 NOTE — Patient Outreach (Signed)
 Care Coordination   11/09/2024 Name: Douglas Hurst. MRN: [id number] DOB: 25-Jun-1941   Care Coordination Outreach Attempts:  An unsuccessful outreach was attempted for an appointment today.  Follow Up Plan:  Additional outreach attempts will be made to complete CCM follow-up visit.   Encounter Outcome:  No Answer on either number listed on file. Message left requesting return call.   Rosaline Finlay, RN MSN [place]  [facility] RN Care Manager Direct Dial: [phone]  Fax: [phone]

## 2024-11-09 NOTE — Patient Instructions (Signed)
 Visit Information  Thank you for taking time to visit with me today. Please don't hesitate to contact me if I can be of assistance to you before our next scheduled appointment.  Your next care management appointment is by telephone on 12/07/24 at 1 PM  Please call the care guide team at [phone] if you need to cancel, schedule, or reschedule an appointment.   Please call the Suicide and Crisis Lifeline: 988 call 1-800-273-TALK (toll free, 24 hour hotline) if you are experiencing a Mental Health or Behavioral Health Crisis or need someone to talk to.  Rosaline Finlay, RN MSN [place]  [facility] RN Care Manager Direct Dial: [phone]  Fax: [phone]

## 2024-11-26 DIAGNOSIS — I110 Hypertensive heart disease with heart failure: Secondary | ICD-10-CM | POA: Diagnosis not present

## 2024-11-26 DIAGNOSIS — E782 Mixed hyperlipidemia: Secondary | ICD-10-CM | POA: Diagnosis not present

## 2024-11-26 DIAGNOSIS — F0390 Unspecified dementia without behavioral disturbance: Secondary | ICD-10-CM | POA: Diagnosis not present

## 2024-11-26 DIAGNOSIS — J449 Chronic obstructive pulmonary disease, unspecified: Secondary | ICD-10-CM | POA: Diagnosis not present

## 2024-11-26 DIAGNOSIS — F319 Bipolar disorder, unspecified: Secondary | ICD-10-CM | POA: Diagnosis not present

## 2024-11-26 DIAGNOSIS — N400 Enlarged prostate without lower urinary tract symptoms: Secondary | ICD-10-CM | POA: Diagnosis not present

## 2024-11-26 DIAGNOSIS — E1140 Type 2 diabetes mellitus with diabetic neuropathy, unspecified: Secondary | ICD-10-CM | POA: Diagnosis not present

## 2024-11-26 DIAGNOSIS — I480 Paroxysmal atrial fibrillation: Secondary | ICD-10-CM | POA: Diagnosis not present

## 2024-12-07 ENCOUNTER — Telehealth: Payer: Self-pay

## 2024-12-07 DIAGNOSIS — K529 Noninfective gastroenteritis and colitis, unspecified: Secondary | ICD-10-CM | POA: Diagnosis not present

## 2024-12-07 NOTE — Patient Outreach (Signed)
 Care Coordination   12/07/2024 Name: Douglas Hurst. MRN: [id number] DOB: 01-10-1941   Care Coordination Outreach Attempts:  An unsuccessful outreach was attempted for an appointment today.  Follow Up Plan:  Additional outreach attempts will be made to complete CCM follow-up visit.   Encounter Outcome:  Spoke with patient's wife, who reports she is on the way to a doctor's appointment right now and requests for RN to call back at a later date. Unable to reschedule appointment because she does not have her calendar with her. Advised that RN will try to contact her again in 1-2 business days.   Rosaline Finlay, RN MSN [place]  [facility] RN Care Manager Direct Dial: [phone]  Fax: [phone]

## 2024-12-08 ENCOUNTER — Other Ambulatory Visit (HOSPITAL_BASED_OUTPATIENT_CLINIC_OR_DEPARTMENT_OTHER): Payer: Self-pay | Admitting: Family

## 2024-12-08 DIAGNOSIS — I5032 Chronic diastolic (congestive) heart failure: Secondary | ICD-10-CM

## 2024-12-12 NOTE — Patient Outreach (Signed)
 Care Coordination   12/12/2024 Name: Douglas Hurst. MRN: [id number] DOB: Mar 14, 1941   Care Coordination Outreach Attempts:  A second unsuccessful outreach was attempted today to complete CCM follow-up visit.  Follow Up Plan:  Additional outreach attempts will be made to complete follow-up visit.   Encounter Outcome:  No Answer. HIPAA compliant voicemail left requesting return call.   Rosaline Finlay, RN MSN [place]  [facility] RN Care Manager Direct Dial: [phone]  Fax: [phone]

## 2024-12-14 NOTE — Patient Outreach (Signed)
 Care Coordination   12/14/2024 Name: [name]. MRN: [id number] DOB: [birth year]   Care Coordination Outreach Attempts:  A third unsuccessful outreach was attempted today to complete CCM follow-up visit.  Follow Up Plan:  Additional outreach attempts will be made to complete follow-up visit.   Encounter Outcome:  Patient's wife reports she is at a doctor's appointment right now and requests a call back at a later time. Advised I would try again another time, and requested that she call back when she is available.   Rosaline Finlay, RN MSN [place]  [facility] RN Care Manager Direct Dial: [phone]  Fax: [phone]

## 2024-12-18 ENCOUNTER — Other Ambulatory Visit: Payer: Self-pay

## 2024-12-18 NOTE — Patient Instructions (Signed)
 Visit Information  Thank you for taking time to visit with me today. Please don't hesitate to contact me if I can be of assistance to you before our next scheduled appointment.  Your next care management appointment is by telephone on 01/15/25 at 1:30 PM  Please call the care guide team at [phone] if you need to cancel, schedule, or reschedule an appointment.   Please call the Suicide and Crisis Lifeline: 988 call 1-800-273-TALK (toll free, 24 hour hotline) if you are experiencing a Mental Health or Behavioral Health Crisis or need someone to talk to.  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  Goal: Over the next 30 days the Patient will demonstrate a decrease colitis in exacerbations as evidenced by report of decreased symptoms (diarrhea, abdominal pain, bloody stools) and compliance with GI recommendations demonstrate Ongoing health management independence as evidenced by patient continuing to monitor BP and weight daily at home         Interventions:   Heart Failure Interventions: Reviewed Heart Failure Action Plan in depth and provided written copy Advised patient to weigh each morning after emptying bladder Discussed importance of daily weight and advised patient to weigh and record daily Reviewed role of diuretics in prevention of fluid overload and management of heart failure;  Colitis Interventions Reviewed GI recommendations and follow-up related to colitis Reviewed the effect that prednisone  can have on blood sugar Ensured patient has follow-up with GI: 01/18/24 per patient's wife  Patient Self-Care Activities:  Attend all scheduled provider  appointments Call provider office for new concerns or questions  Perform all self care activities independently  Take medications as prescribed   call office if I gain more than 2 pounds in one day or 5 pounds in one week use salt in moderation watch for swelling in feet, ankles and legs every day weigh myself daily develop a rescue plan follow rescue plan if symptoms flare-up know when to call the doctor:weight gain; swelling in the legs, feet, or abdomen; worsening cough or shortness of breath; requiring more pillows to sleep at night  Plan:  Telephone follow up appointment with care management team member scheduled for:  01/15/25 at 1:30 PM            Patient's wife Candace Ramus verbalized understanding of Care plan and visit instructions communicated this visit

## 2024-12-18 NOTE — Patient Outreach (Signed)
 Complex Care Management   Visit Note  12/18/2024  Name:  Douglas Hurst. 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  Visit completed with Douglas Hurst  on the phone  Background:   Past Medical History:  Diagnosis Date   Bipolar disorder (HCC)    Chronic back pain    Complication of anesthesia    prolonged sedation and confusion   COPD (chronic obstructive pulmonary disease) (HCC)    Dr. signa   Diabetes mellitus without complication [facility])    type 2   Diverticulosis    Dysrhythmia    Afib  onset 03-2022   ED (erectile dysfunction)    Gallstones    GERD (gastroesophageal reflux disease)    Hiatal hernia    Hyperlipidemia    Hypertension    Weakness 03/29/2023   Wears dentures    upper   Wears glasses    Wears hearing aid in both ears     Assessment: Patient Reported Symptoms:  Cognitive Cognitive Status: Able to follow simple commands, Alert and oriented to person, place, and time, Normal speech and language skills Cognitive/Intellectual Conditions Management [RPT]: None reported or documented in medical history or problem list   Health Maintenance Behaviors: Annual physical exam, Healthy diet Health Facilitated by: Healthy diet, Rest  Neurological Neurological Review of Symptoms: No symptoms reported Neurological Management Strategies: Coping strategies, Medication therapy, Routine screening Neurological Comment: Patient's wife reports he has been started on Memantine 5 mg by PCP  HEENT HEENT Symptoms Reported: Not assessed      Cardiovascular Cardiovascular Symptoms Reported: No symptoms reported Does patient have uncontrolled Hypertension?: Yes Is patient checking Blood Pressure at home?: Yes Patient's Recent BP reading at home: 123/73 Cardiovascular Management Strategies: Medication therapy, Routine screening,  Weight management Do You Have a Working Readable Scale?: Yes Weight: 194 lb (88 kg) (Reported by patient's wife)  Respiratory Respiratory Symptoms Reported: Not assesed    Endocrine Endocrine Symptoms Reported: No symptoms reported Is patient diabetic?: Yes Is patient checking blood sugars at home?: Yes List most recent blood sugar readings, include date and time of day: 108-114 per patient's wife    Gastrointestinal Gastrointestinal Symptoms Reported: No symptoms reported Additional Gastrointestinal Details: Patient's wife reports stools are still not real firm but not as liquid as they used to be. She reports decreased episodes of incontinence. Patient's wife reports patient continues on prednisone  through the holidays. She reports patient continues to have a good appetite, and no difficulty swallowing Gastrointestinal Management Strategies: Incontinence garment/pad, Medication therapy    Genitourinary Genitourinary Symptoms Reported: Not assessed    Integumentary Integumentary Symptoms Reported: Not assessed    Musculoskeletal Musculoskelatal Symptoms Reviewed: Unsteady gait, Weakness Additional Musculoskeletal Details: Patient's wife reports feeling like patient has slowed down and is shuffling his feet more. She reports HH PT has been ordered Lucrezia) and will start in January. Patient continues to use a cane for ambulation, and they do have a walker if needed Musculoskeletal Management Strategies: Medical device Elene) Musculoskeletal Comment: Patient's wife denies falls since previous CMRN visit Falls in the past year?: No Number of falls in past year: 1 or less Was there an injury with Fall?: No Fall Risk Category Calculator: 0 Patient Fall Risk Level: Low Fall Risk Patient at Risk for Falls Due to: Impaired balance/gait Fall risk Follow up: Falls evaluation completed, Education provided, Falls prevention discussed  Psychosocial Psychosocial Symptoms Reported: Not assessed           12/18/2024    PHQ2-9 Depression Screening   Little interest or pleasure in doing things    Feeling down, depressed, or hopeless    PHQ-2 - Total Score    Trouble falling or staying asleep, or sleeping too much    Feeling tired or having little energy    Poor appetite or overeating     Feeling bad about yourself - or that you are a failure or have let yourself or your family down    Trouble concentrating on things, such as reading the newspaper or watching television    Moving or speaking so slowly that other people could have noticed.  Or the opposite - being so fidgety or restless that you have been moving around a lot more than usual    Thoughts that you would be better off dead, or hurting yourself in some way    PHQ2-9 Total Score    If you checked off any problems, how difficult have these problems made it for you to do your work, take care of things at home, or get along with other people    Depression Interventions/Treatment      Today's Vitals   12/18/24 1128  Weight: 194 lb (88 kg)      Medications Reviewed Today     Reviewed by Arno Rosaline SQUIBB, RN (Registered Nurse) on 12/18/24 at 1131  Med List Status: <None>   Medication Order Taking? Sig Documenting Provider Last Dose Status Informant  acetaminophen  (TYLENOL ) 325 MG tablet [id number]  Take 2 tablets (650 mg total) by mouth every 6 (six) hours as needed for mild pain (or Fever >/= 101). Sheikh, Omair [place], DO  Active Spouse/Significant Other, Self  donepezil  (ARICEPT ) 5 MG tablet [id number]  Take 5 mg by mouth at bedtime. [provider]  Active Self, Spouse/Significant Other  ELIQUIS  5 MG TABS tablet [id number]  TAKE 1 TABLET BY MOUTH TWICE A DAY Walker, Caitlin S, NP  Active Spouse/Significant Other, Self  finasteride  (PROSCAR ) 5 MG tablet [id number]  Take 5 mg by mouth daily. [provider]  Active Spouse/Significant Other, Self  furosemide  (LASIX ) 20 MG tablet [id number]  TAKE 1 TABLET  AS NEEDED FOR WEIGHT GAIN OF 3 POUNDS IN 24 HOURS OR 5 POUNDS IN A WEEK.Pt must keep upcoming followup appt with Cardiology in January 2026 for any more refills. Thank You Lonni Slain, MD  Active   memantine (NAMENDA) 5 MG tablet [id number] Yes Take 5 mg by mouth daily. [provider]  Active   mesalamine (LIALDA) 1.2 g EC tablet [id number]  Take 1.5 g by mouth daily with breakfast. [provider]  Active   Multiple Vitamin (MULTIVITAMIN WITH MINERALS) TABS tablet [id number]  Take 1 tablet by mouth daily. [provider]  Active Spouse/Significant Other, Self  pantoprazole  (PROTONIX ) 40 MG tablet [id number]  Take 40 mg by mouth daily. [provider]  Active Spouse/Significant Other, Self  polyethylene glycol powder (GLYCOLAX /MIRALAX ) 17 GM/SCOOP powder [id number]  Take 17 g by mouth daily.  Patient taking differently: Take 17 g by mouth daily as needed for mild constipation or moderate constipation.   Sheikh, Omair [place], DO  Active Spouse/Significant Other, Self  pravastatin  (PRAVACHOL ) 40 MG tablet [id number]  Take 1 tablet (40 mg total) by mouth every evening. Raford Riggs, MD  Active Spouse/Significant Other, Self  predniSONE  (DELTASONE ) 10 MG tablet [id number] Yes 1 tablet with food or  milk Orally Once a day; Duration: 30 days [provider]  Active   SPIRIVA  RESPIMAT 2.5 MCG/ACT AERS [id number]  Inhale 2 puffs into the lungs daily as needed (COPD). [provider]  Active Spouse/Significant Other, Self  spironolactone  (ALDACTONE ) 25 MG tablet [id number]  TAKE 1 TABLET (25 MG TOTAL) BY MOUTH DAILY.  Patient taking differently: Take 12.5 mg by mouth daily.   Vannie Reche RAMAN, NP  Active Spouse/Significant Other, Self  tadalafil (CIALIS) 20 MG tablet [id number]  Take 20 mg by mouth every three (3) days as needed for erectile dysfunction. [provider]  Active Spouse/Significant Other, Self  tamsulosin  (FLOMAX ) 0.4 MG CAPS  capsule [id number]  Take 0.4 mg by mouth daily after supper. [provider]  Active Spouse/Significant Other, Self            Recommendation:   Continue Current Plan of Care  Follow Up Plan:   Telephone follow up appointment date/time:  01/15/25 at 1:30 PM  Rosaline Finlay, RN MSN [place]  [facility] RN Care Manager Direct Dial: [phone]  Fax: [phone]

## 2025-01-01 ENCOUNTER — Ambulatory Visit (HOSPITAL_BASED_OUTPATIENT_CLINIC_OR_DEPARTMENT_OTHER): Admitting: Cardiology

## 2025-01-01 ENCOUNTER — Encounter (HOSPITAL_BASED_OUTPATIENT_CLINIC_OR_DEPARTMENT_OTHER): Payer: Self-pay | Admitting: Cardiology

## 2025-01-01 VITALS — BP 134/80 | HR 94 | Ht 73.0 in | Wt 196.8 lb

## 2025-01-01 DIAGNOSIS — I480 Paroxysmal atrial fibrillation: Secondary | ICD-10-CM | POA: Diagnosis not present

## 2025-01-01 DIAGNOSIS — E782 Mixed hyperlipidemia: Secondary | ICD-10-CM | POA: Diagnosis not present

## 2025-01-01 DIAGNOSIS — Z7901 Long term (current) use of anticoagulants: Secondary | ICD-10-CM

## 2025-01-01 DIAGNOSIS — I5032 Chronic diastolic (congestive) heart failure: Secondary | ICD-10-CM | POA: Diagnosis not present

## 2025-01-01 DIAGNOSIS — D6859 Other primary thrombophilia: Secondary | ICD-10-CM | POA: Diagnosis not present

## 2025-01-01 MED ORDER — PRAVASTATIN SODIUM 40 MG PO TABS: 40.0000 mg | ORAL_TABLET | Freq: Every evening | ORAL | 3 refills | Status: AC

## 2025-01-01 MED ORDER — APIXABAN 5 MG PO TABS: 5.0000 mg | ORAL_TABLET | Freq: Two times a day (BID) | ORAL | 3 refills | Status: AC

## 2025-01-01 MED ORDER — FUROSEMIDE 20 MG PO TABS: 20.0000 mg | ORAL_TABLET | ORAL | 3 refills | Status: AC

## 2025-01-01 MED ORDER — SPIRONOLACTONE 25 MG PO TABS: 12.5000 mg | ORAL_TABLET | Freq: Every day | ORAL | 3 refills | Status: AC

## 2025-01-01 NOTE — Patient Instructions (Signed)

## 2025-01-01 NOTE — Progress Notes (Signed)
" °  Cardiology Office Note:  .   Date:  01/01/2025  ID:  Douglas Hurst., DOB 22-Jun-1941, MRN [id number] PCP: Charlott Dorn LABOR, MD  [facility] Providers Cardiologist:  Shelda Bruckner, MD {  History of Present Illness: Douglas Hurst. is a 84 y.o. male with PMH atrial fibrillation, hypertension, hyperlipidemia, type II diabetes. I met him 06/18/23.  Pertinent CV history: most recent echo 2024 EF 50-55%, prior 40-45% 03/2022, 60-65% 04/2022. I met him 06/18/23 for urgent visit for 10 lbs weight gain, one sided leg swelling.   Today: Here with his wife today. Per wife, Dr. Charlott was concerned about whether he was taking his statin (he has been). At our last visit, lasix  was as needed for weight gain, but he has been taking regularly Monday/Wednesday/Friday. This regimen has prevented him from any leg swelling. Per report, kidney function has been stable.   ROS: Denies chest pain, shortness of breath at rest or with normal exertion. No PND, orthopnea, LE edema or unexpected weight gain. No syncope or palpitations. ROS otherwise negative except as noted.    Studies Reviewed: Douglas    EKG:     not ordered today  Physical Exam:   VS:  BP 134/80   Pulse 94   Ht 6' 1 (1.854 m)   Wt 196 lb 12.8 oz (89.3 kg)   SpO2 93%   BMI 25.96 kg/m    Wt Readings from Last 3 Encounters:  01/01/25 196 lb 12.8 oz (89.3 kg)  12/18/24 194 lb (88 kg)  11/09/24 195 lb (88.5 kg)    GEN: Well nourished, well developed in no acute distress HEENT: Normal, moist mucous membranes NECK: No JVD CARDIAC: regular rhythm, normal S1 and S2, no rubs or gallops. No murmur. VASCULAR: Radial and DP pulses 2+ bilaterally. No carotid bruits RESPIRATORY:  Clear to auscultation without rales, wheezing or rhonchi  ABDOMEN: Soft, non-tender, non-distended MUSCULOSKELETAL:  in wheelchair but moves all 4 limbs independently SKIN: Warm and dry, no edema NEUROLOGIC:  Alert and oriented x 3. No focal  neuro deficits noted. PSYCHIATRIC:  Normal affect    ASSESSMENT AND PLAN: .   LE edema--resolved Grade 1 diastolic dysfunction/chronic diastolic heart failure -edema has resolved. Echo with normal EF. BNP normalized -reviewed signs/symptoms to watch for -reviewed medications, reports he is taking lasix  MWF, prescription updated. Labs have been followed by Dr. 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 Not mentioned on CT from 06/2024, I personally measured at 3.4 cm, normal. Does not need further imaging.  AAA -followed by [facility]  Hyperlipidema -continue pravastatin    Paroxysmal atrial fibrillation -chadsvasc=5, secondary hypercoagulable state -tolerating apixaban  -has not felt afib recently  Follow up: 1 year   Signed, Shelda Bruckner, MD   Shelda Bruckner, MD, PhD, [facility] [place]  [facility]  [place]  [facility] at [facility] at [facility] [address] [place], [place] [address] [phone]   "

## 2025-01-15 ENCOUNTER — Other Ambulatory Visit: Payer: Self-pay

## 2025-01-15 ENCOUNTER — Telehealth: Payer: Self-pay

## 2025-01-15 NOTE — Patient Outreach (Signed)
 Complex Care Management   Visit Note  01/15/2025  Name:  Douglas Hurst. MRN: [id number] DOB: 09/27/1941  Situation: Referral received for Complex Care Management related to Heart Failure and colitis I obtained verbal consent from patient's wife Douglas Hurst.  Visit completed with [name]  on the phone  Background:   Past Medical History:  Diagnosis Date   Bipolar disorder (HCC)    Chronic back pain    Complication of anesthesia    prolonged sedation and confusion   COPD (chronic obstructive pulmonary disease) (HCC)    Dr. signa   Diabetes mellitus without complication [facility])    type 2   Diverticulosis    Dysrhythmia    Afib  onset 03-2022   ED (erectile dysfunction)    Gallstones    GERD (gastroesophageal reflux disease)    Hiatal hernia    Hyperlipidemia    Hypertension    Weakness 03/29/2023   Wears dentures    upper   Wears glasses    Wears hearing aid in both ears     Assessment: Patient Reported Symptoms:  Cognitive Cognitive Status: Able to follow simple commands, Alert and oriented to person, place, and time, Normal speech and language skills Cognitive/Intellectual Conditions Management [RPT]: None reported or documented in medical history or problem list   Health Maintenance Behaviors: Annual physical exam, Healthy diet Health Facilitated by: Healthy diet, Rest  Neurological Neurological Review of Symptoms: No symptoms reported Neurological Management Strategies: Coping strategies, Medication therapy, Routine screening  HEENT HEENT Symptoms Reported: No symptoms reported HEENT Management Strategies: Medical device, Routine screening    Cardiovascular Cardiovascular Symptoms Reported: No symptoms reported Does patient have uncontrolled Hypertension?: No Cardiovascular Management Strategies: Medical device, Routine screening, Weight management Do You Have a Working Readable Scale?: Yes Weight: 196 lb (88.9 kg) (Reported by patient's wife)   Respiratory Respiratory Symptoms Reported: Shortness of breath Additional Respiratory Details: Patient's wife reports she notices that he gets short of breath with exertion, such as walking to the car. She reports they do have Spiriva  on hand and it seems to help Respiratory Management Strategies: Coping strategies, Medication therapy, Routine screening  Endocrine Endocrine Symptoms Reported: No symptoms reported Is patient diabetic?: Yes Is patient checking blood sugars at home?: Yes List most recent blood sugar readings, include date and time of day: 128 on 01/12/25    Gastrointestinal Gastrointestinal Symptoms Reported: Diarrhea Additional Gastrointestinal Details: Patient's wife reports patient has been wanting to eat all the time due to prednisone  prescribed by GI. Patient's wife also reports increased bowel incontinence. She denies blood in the stool and describes stools as semi-firm. She reports they do have f/u with GI 01/17/25. Patient is having about two BMs a day Gastrointestinal Management Strategies: Incontinence garment/pad, Medication therapy    Genitourinary Genitourinary Symptoms Reported: No symptoms reported    Integumentary Integumentary Symptoms Reported: No symptoms reported    Musculoskeletal Musculoskelatal Symptoms Reviewed: Unsteady gait, Weakness Additional Musculoskeletal Details: Patient's wife reports HH PT has started once a week, and OT will be starting this week Musculoskeletal Management Strategies: Medical device Elene) Musculoskeletal Comment: Patient's wife denies falls since previous [facility] visit Falls in the past year?: No Number of falls in past year: 1 or less Was there an injury with Fall?: No Fall Risk Category Calculator: 0 Patient Fall Risk Level: Low Fall Risk Patient at Risk for Falls Due to: Impaired balance/gait Fall risk Follow up: Falls evaluation completed, Education provided, Falls prevention discussed  Psychosocial Psychosocial  Symptoms Reported: No symptoms reported     Quality of Family Relationships: helpful, involved, supportive Do you feel physically threatened by others?: No    01/15/2025    PHQ2-9 Depression Screening   Little interest or pleasure in doing things    Feeling down, depressed, or hopeless    PHQ-2 - Total Score    Trouble falling or staying asleep, or sleeping too much    Feeling tired or having little energy    Poor appetite or overeating     Feeling bad about yourself - or that you are a failure or have let yourself or your family down    Trouble concentrating on things, such as reading the newspaper or watching television    Moving or speaking so slowly that other people could have noticed.  Or the opposite - being so fidgety or restless that you have been moving around a lot more than usual    Thoughts that you would be better off dead, or hurting yourself in some way    PHQ2-9 Total Score    If you checked off any problems, how difficult have these problems made it for you to do your work, take care of things at home, or get along with other people    Depression Interventions/Treatment      Today's Vitals   01/15/25 1536  Weight: 196 lb (88.9 kg)      Medications Reviewed Today     Reviewed by Arno Rosaline SQUIBB, RN (Registered Nurse) on 01/15/25 at 1535  Med List Status: <None>   Medication Order Taking? Sig Documenting Provider Last Dose Status Informant  acetaminophen  (TYLENOL ) 325 MG tablet [id number]  Take 2 tablets (650 mg total) by mouth every 6 (six) hours as needed for mild pain (or Fever >/= 101). Sheikh, Omair [place], DO  Active Spouse/Significant Other, Self  apixaban  (ELIQUIS ) 5 MG TABS tablet [id number]  Take 1 tablet (5 mg total) by mouth 2 (two) times daily. Lonni Slain, MD  Active   donepezil  (ARICEPT ) 5 MG tablet [id number]  Take 5 mg by mouth at bedtime. [provider]  Active Self, Spouse/Significant Other  finasteride  (PROSCAR ) 5 MG  tablet [id number]  Take 5 mg by mouth daily. [provider]  Active Spouse/Significant Other, Self  furosemide  (LASIX ) 20 MG tablet [id number]  Take 1 tablet (20 mg total) by mouth every Monday, Wednesday, and Friday. Lonni Slain, MD  Active   memantine [facility]) 5 MG tablet [id number]  Take 5 mg by mouth daily. [provider]  Active   mesalamine (APRISO) 0.375 g 24 hr capsule [id number]  Take 1.5 g by mouth every morning. [provider]  Active   Multiple Vitamin (MULTIVITAMIN WITH MINERALS) TABS tablet [id number]  Take 1 tablet by mouth daily. [provider]  Active Spouse/Significant Other, Self  pantoprazole  (PROTONIX ) 40 MG tablet [id number]  Take 40 mg by mouth daily. [provider]  Active Spouse/Significant Other, Self  polyethylene glycol powder (GLYCOLAX /MIRALAX ) 17 GM/SCOOP powder [id number]  Take 17 g by mouth daily.  Patient taking differently: Take 17 g by mouth daily as needed for mild constipation or moderate constipation.   Sheikh, Omair [place], DO  Active Spouse/Significant Other, Self  pravastatin  (PRAVACHOL ) 40 MG tablet [id number]  Take 1 tablet (40 mg total) by mouth every evening. Lonni Slain, MD  Active   predniSONE  (DELTASONE ) 10 MG tablet [id number]  1 tablet with food or milk Orally Once a day; Duration: 30 days [provider]  Active   SPIRIVA  RESPIMAT 2.5 MCG/ACT AERS [id number]  Inhale 2 puffs into the lungs daily as needed (COPD). [provider]  Active Spouse/Significant Other, Self  spironolactone  (ALDACTONE ) 25 MG tablet [id number]  Take 0.5 tablets (12.5 mg total) by mouth daily. Lonni Slain, MD  Active   tadalafil (CIALIS) 20 MG tablet [id number]  Take 20 mg by mouth every three (3) days as needed for erectile dysfunction. [provider]  Active Spouse/Significant Other, Self  tamsulosin  (FLOMAX ) 0.4 MG CAPS capsule [id number]  Take 0.4 mg by mouth daily after supper.  [provider]  Active Spouse/Significant Other, Self            Recommendation:   Continue Current Plan of Care  Follow Up Plan:   Telephone follow up appointment date/time:  02/12/25 at 11 AM  Rosaline Finlay, RN MSN [place]  [facility] RN Care Manager Direct Dial: [phone]  Fax: [phone]

## 2025-01-15 NOTE — Patient Instructions (Signed)
 Visit Information  Thank you for taking time to visit with me today. Please don't hesitate to contact me if I can be of assistance to you before our next scheduled appointment.  Your next care management appointment is by telephone on 02/12/25 at 11 AM  Please call the care guide team at [phone] if you need to cancel, schedule, or reschedule an appointment.   Please call the Suicide and Crisis Lifeline: 988 call 1-800-273-TALK (toll free, 24 hour hotline) if you are experiencing a Mental Health or Behavioral Health Crisis or need someone to talk to.  Rosaline Finlay, RN MSN [place]  [facility] RN Care Manager Direct Dial: [phone]  Fax: [phone]   Following is a copy of your care plan:   Goals Addressed             This Visit's Progress    [facility] RN Care Plan   On track    Problems:  Chronic Disease Management support and education needs related to CHF and colitis  Goal: Over the next 30 days the Patient will demonstrate a decrease colitis in exacerbations as evidenced by report of decreased symptoms (diarrhea, abdominal pain, bloody stools) and compliance with GI recommendations demonstrate Ongoing health management independence as evidenced by patient continuing to monitor BP and weight daily at home         Interventions:   Heart Failure Interventions: Reviewed Heart Failure Action Plan in depth and provided written copy Advised patient to weigh each morning after emptying bladder Discussed importance of daily weight and advised patient to weigh and record daily Reviewed role of diuretics in prevention of fluid overload and management of heart failure;  Colitis Interventions Reviewed GI recommendations and follow-up related to colitis Reviewed the effect that prednisone  can have on blood sugar Ensured patient has follow-up with GI: 01/18/24 per patient's wife Advised monitoring for a trend on when patient has BMs during the day. Advised  asking him to sit on the toilet around this time to avoid episodes of incontinence  Patient Self-Care Activities:  Attend all scheduled provider appointments Call provider office for new concerns or questions  Perform all self care activities independently  Take medications as prescribed   call office if I gain more than 2 pounds in one day or 5 pounds in one week use salt in moderation watch for swelling in feet, ankles and legs every day weigh myself daily develop a rescue plan follow rescue plan if symptoms flare-up know when to call the doctor:weight gain; swelling in the legs, feet, or abdomen; worsening cough or shortness of breath; requiring more pillows to sleep at night  Plan:  Telephone follow up appointment with care management team member scheduled for:  02/12/25 at 11 AM             Patient verbalized understanding of Care plan and visit instructions communicated this visit

## 2025-02-12 ENCOUNTER — Telehealth

## 2025-05-02 ENCOUNTER — Ambulatory Visit

## 2025-05-02 ENCOUNTER — Ambulatory Visit (HOSPITAL_COMMUNITY)
